# Patient Record
Sex: Female | Born: 1955 | Race: Black or African American | Hispanic: No | Marital: Married | Smoking: Former smoker
Health system: Southern US, Community
[De-identification: ages and names within clinical notes are randomized; demographics above are authoritative.]

## PROBLEM LIST (undated history)

## (undated) DIAGNOSIS — Z9884 Bariatric surgery status: Secondary | ICD-10-CM

## (undated) DIAGNOSIS — M1710 Unilateral primary osteoarthritis, unspecified knee: Secondary | ICD-10-CM

## (undated) DIAGNOSIS — M25561 Pain in right knee: Secondary | ICD-10-CM

## (undated) DIAGNOSIS — E669 Obesity, unspecified: Secondary | ICD-10-CM

## (undated) DIAGNOSIS — R9431 Abnormal electrocardiogram [ECG] [EKG]: Secondary | ICD-10-CM

## (undated) DIAGNOSIS — M1990 Unspecified osteoarthritis, unspecified site: Secondary | ICD-10-CM

## (undated) DIAGNOSIS — K436 Other and unspecified ventral hernia with obstruction, without gangrene: Secondary | ICD-10-CM

## (undated) DIAGNOSIS — M25552 Pain in left hip: Secondary | ICD-10-CM

## (undated) DIAGNOSIS — R600 Localized edema: Secondary | ICD-10-CM

## (undated) DIAGNOSIS — E538 Deficiency of other specified B group vitamins: Secondary | ICD-10-CM

## (undated) DIAGNOSIS — E785 Hyperlipidemia, unspecified: Secondary | ICD-10-CM

## (undated) DIAGNOSIS — R7303 Prediabetes: Secondary | ICD-10-CM

## (undated) DIAGNOSIS — G4700 Insomnia, unspecified: Secondary | ICD-10-CM

## (undated) DIAGNOSIS — D649 Anemia, unspecified: Secondary | ICD-10-CM

## (undated) DIAGNOSIS — R0600 Dyspnea, unspecified: Secondary | ICD-10-CM

## (undated) DIAGNOSIS — R079 Chest pain, unspecified: Secondary | ICD-10-CM

## (undated) DIAGNOSIS — R002 Palpitations: Secondary | ICD-10-CM

## (undated) DIAGNOSIS — M179 Osteoarthritis of knee, unspecified: Secondary | ICD-10-CM

## (undated) DIAGNOSIS — Z87898 Personal history of other specified conditions: Secondary | ICD-10-CM

## (undated) DIAGNOSIS — M549 Dorsalgia, unspecified: Secondary | ICD-10-CM

## (undated) DIAGNOSIS — I10 Essential (primary) hypertension: Secondary | ICD-10-CM

## (undated) DIAGNOSIS — G4733 Obstructive sleep apnea (adult) (pediatric): Secondary | ICD-10-CM

## (undated) DIAGNOSIS — R7989 Other specified abnormal findings of blood chemistry: Secondary | ICD-10-CM

## (undated) DIAGNOSIS — I519 Heart disease, unspecified: Secondary | ICD-10-CM

## (undated) HISTORY — DX: Chest pain, unspecified: R07.9

## (undated) HISTORY — PX: INGUINAL HERNIA REPAIR: SUR1180

## (undated) HISTORY — DX: Deficiency of other specified B group vitamins: E53.8

## (undated) HISTORY — DX: Heart disease, unspecified: I51.9

## (undated) HISTORY — DX: Bariatric surgery status: Z98.84

## (undated) HISTORY — DX: Pain in right knee: M25.561

## (undated) HISTORY — DX: Localized edema: R60.0

## (undated) HISTORY — DX: Obstructive sleep apnea (adult) (pediatric): G47.33

## (undated) HISTORY — DX: Pain in left hip: M25.552

## (undated) HISTORY — DX: Essential (primary) hypertension: I10

## (undated) HISTORY — DX: Other and unspecified ventral hernia with obstruction, without gangrene: K43.6

## (undated) HISTORY — DX: Anemia, unspecified: D64.9

## (undated) HISTORY — PX: TUBAL LIGATION: SHX77

## (undated) HISTORY — DX: Unilateral primary osteoarthritis, unspecified knee: M17.10

## (undated) HISTORY — DX: Obesity, unspecified: E66.9

## (undated) HISTORY — DX: Unspecified osteoarthritis, unspecified site: M19.90

## (undated) HISTORY — DX: Palpitations: R00.2

## (undated) HISTORY — DX: Osteoarthritis of knee, unspecified: M17.9

## (undated) HISTORY — DX: Personal history of other specified conditions: Z87.898

## (undated) HISTORY — DX: Dyspnea, unspecified: R06.00

## (undated) HISTORY — DX: Insomnia, unspecified: G47.00

## (undated) HISTORY — PX: GASTRIC BYPASS: SHX52

## (undated) HISTORY — DX: Abnormal electrocardiogram (ECG) (EKG): R94.31

## (undated) HISTORY — DX: Hyperlipidemia, unspecified: E78.5

## (undated) HISTORY — DX: Other specified abnormal findings of blood chemistry: R79.89

## (undated) HISTORY — DX: Dorsalgia, unspecified: M54.9

---

## 1998-05-13 ENCOUNTER — Emergency Department (HOSPITAL_COMMUNITY): Admission: EM | Admit: 1998-05-13 | Discharge: 1998-05-13 | Payer: Self-pay | Admitting: Emergency Medicine

## 1998-05-13 ENCOUNTER — Encounter: Payer: Self-pay | Admitting: Emergency Medicine

## 1999-04-19 ENCOUNTER — Emergency Department (HOSPITAL_COMMUNITY): Admission: EM | Admit: 1999-04-19 | Discharge: 1999-04-19 | Payer: Self-pay | Admitting: Emergency Medicine

## 2000-11-02 ENCOUNTER — Ambulatory Visit (HOSPITAL_COMMUNITY): Admission: RE | Admit: 2000-11-02 | Discharge: 2000-11-02 | Payer: Self-pay

## 2002-03-26 ENCOUNTER — Emergency Department (HOSPITAL_COMMUNITY): Admission: EM | Admit: 2002-03-26 | Discharge: 2002-03-26 | Payer: Self-pay | Admitting: Emergency Medicine

## 2002-03-26 ENCOUNTER — Encounter: Payer: Self-pay | Admitting: Emergency Medicine

## 2002-05-20 ENCOUNTER — Ambulatory Visit (HOSPITAL_COMMUNITY): Admission: RE | Admit: 2002-05-20 | Discharge: 2002-05-20 | Payer: Self-pay

## 2002-07-11 HISTORY — PX: FOOT SURGERY: SHX648

## 2002-11-13 ENCOUNTER — Encounter: Admission: RE | Admit: 2002-11-13 | Discharge: 2002-11-13 | Payer: Self-pay | Admitting: Internal Medicine

## 2002-11-15 ENCOUNTER — Encounter: Admission: RE | Admit: 2002-11-15 | Discharge: 2002-11-15 | Payer: Self-pay | Admitting: Internal Medicine

## 2002-12-03 ENCOUNTER — Encounter: Admission: RE | Admit: 2002-12-03 | Discharge: 2002-12-03 | Payer: Self-pay | Admitting: Internal Medicine

## 2003-06-11 DIAGNOSIS — R9431 Abnormal electrocardiogram [ECG] [EKG]: Secondary | ICD-10-CM

## 2003-06-11 HISTORY — DX: Abnormal electrocardiogram (ECG) (EKG): R94.31

## 2003-06-24 ENCOUNTER — Ambulatory Visit (HOSPITAL_COMMUNITY): Admission: RE | Admit: 2003-06-24 | Discharge: 2003-06-24 | Payer: Self-pay | Admitting: Internal Medicine

## 2003-07-02 ENCOUNTER — Ambulatory Visit (HOSPITAL_COMMUNITY): Admission: RE | Admit: 2003-07-02 | Discharge: 2003-07-02 | Payer: Self-pay

## 2003-07-17 ENCOUNTER — Encounter: Admission: RE | Admit: 2003-07-17 | Discharge: 2003-07-17 | Payer: Self-pay | Admitting: Internal Medicine

## 2003-07-23 ENCOUNTER — Encounter: Payer: Self-pay | Admitting: Cardiology

## 2003-07-23 ENCOUNTER — Ambulatory Visit (HOSPITAL_COMMUNITY): Admission: RE | Admit: 2003-07-23 | Discharge: 2003-07-23 | Payer: Self-pay | Admitting: Internal Medicine

## 2003-08-05 ENCOUNTER — Encounter: Admission: RE | Admit: 2003-08-05 | Discharge: 2003-08-05 | Payer: Self-pay | Admitting: Internal Medicine

## 2003-08-11 ENCOUNTER — Emergency Department (HOSPITAL_COMMUNITY): Admission: EM | Admit: 2003-08-11 | Discharge: 2003-08-11 | Payer: Self-pay | Admitting: Family Medicine

## 2003-08-12 ENCOUNTER — Encounter: Admission: RE | Admit: 2003-08-12 | Discharge: 2003-08-12 | Payer: Self-pay | Admitting: Internal Medicine

## 2003-09-27 ENCOUNTER — Emergency Department (HOSPITAL_COMMUNITY): Admission: AD | Admit: 2003-09-27 | Discharge: 2003-09-27 | Payer: Self-pay | Admitting: Family Medicine

## 2004-10-15 ENCOUNTER — Emergency Department (HOSPITAL_COMMUNITY): Admission: EM | Admit: 2004-10-15 | Discharge: 2004-10-15 | Payer: Self-pay | Admitting: Emergency Medicine

## 2004-11-05 ENCOUNTER — Ambulatory Visit: Payer: Self-pay | Admitting: Internal Medicine

## 2006-03-02 ENCOUNTER — Ambulatory Visit: Payer: Self-pay | Admitting: Hospitalist

## 2006-03-02 LAB — CONVERTED CEMR LAB: Pap Smear: NORMAL

## 2006-03-22 ENCOUNTER — Emergency Department (HOSPITAL_COMMUNITY): Admission: EM | Admit: 2006-03-22 | Discharge: 2006-03-22 | Payer: Self-pay | Admitting: Family Medicine

## 2006-07-18 ENCOUNTER — Ambulatory Visit: Payer: Self-pay | Admitting: Internal Medicine

## 2006-07-19 ENCOUNTER — Encounter (INDEPENDENT_AMBULATORY_CARE_PROVIDER_SITE_OTHER): Payer: Self-pay | Admitting: Internal Medicine

## 2006-07-19 ENCOUNTER — Ambulatory Visit: Payer: Self-pay | Admitting: Hospitalist

## 2006-07-19 LAB — CONVERTED CEMR LAB
Basophils Absolute: 0 10*3/uL (ref 0.0–0.1)
Basophils Relative: 1 % (ref 0–1)
Eosinophils Relative: 6 % — ABNORMAL HIGH (ref 0–5)
HCT: 36.3 % (ref 36.0–46.0)
Hemoglobin: 11.7 g/dL — ABNORMAL LOW (ref 12.0–15.0)
Lymphocytes Relative: 43 % (ref 12–46)
Lymphs Abs: 2.8 10*3/uL (ref 0.7–3.3)
MCHC: 32.2 g/dL (ref 30.0–36.0)
MCV: 88.8 fL (ref 78.0–100.0)
Monocytes Absolute: 0.5 10*3/uL (ref 0.2–0.7)
Monocytes Relative: 8 % (ref 3–11)
Neutro Abs: 2.9 10*3/uL (ref 1.7–7.7)
Neutrophils Relative %: 43 % (ref 43–77)
Platelets: 369 10*3/uL (ref 150–400)
RBC: 4.09 M/uL (ref 3.87–5.11)
RDW: 14.9 % — ABNORMAL HIGH (ref 11.5–14.0)
WBC: 6.6 10*3/uL (ref 4.0–10.5)

## 2006-08-11 ENCOUNTER — Encounter (INDEPENDENT_AMBULATORY_CARE_PROVIDER_SITE_OTHER): Payer: Self-pay | Admitting: Hospitalist

## 2006-08-11 DIAGNOSIS — J309 Allergic rhinitis, unspecified: Secondary | ICD-10-CM | POA: Insufficient documentation

## 2006-08-11 DIAGNOSIS — R600 Localized edema: Secondary | ICD-10-CM | POA: Insufficient documentation

## 2006-08-11 DIAGNOSIS — I10 Essential (primary) hypertension: Secondary | ICD-10-CM | POA: Insufficient documentation

## 2006-08-15 DIAGNOSIS — D649 Anemia, unspecified: Secondary | ICD-10-CM | POA: Insufficient documentation

## 2006-10-22 ENCOUNTER — Emergency Department (HOSPITAL_COMMUNITY): Admission: EM | Admit: 2006-10-22 | Discharge: 2006-10-22 | Payer: Self-pay | Admitting: Emergency Medicine

## 2007-03-17 ENCOUNTER — Emergency Department (HOSPITAL_COMMUNITY): Admission: EM | Admit: 2007-03-17 | Discharge: 2007-03-17 | Payer: Self-pay | Admitting: Emergency Medicine

## 2007-03-19 ENCOUNTER — Telehealth: Payer: Self-pay | Admitting: *Deleted

## 2007-07-12 HISTORY — PX: COLONOSCOPY: SHX174

## 2007-08-16 ENCOUNTER — Ambulatory Visit: Payer: Self-pay | Admitting: Internal Medicine

## 2007-08-16 ENCOUNTER — Encounter (INDEPENDENT_AMBULATORY_CARE_PROVIDER_SITE_OTHER): Payer: Self-pay | Admitting: *Deleted

## 2007-08-16 LAB — CONVERTED CEMR LAB
ALT: 8 units/L (ref 0–35)
AST: 13 units/L (ref 0–37)
Albumin: 4.3 g/dL (ref 3.5–5.2)
Alkaline Phosphatase: 95 units/L (ref 39–117)
BUN: 14 mg/dL (ref 6–23)
Basophils Absolute: 0.1 10*3/uL (ref 0.0–0.1)
Basophils Relative: 1 % (ref 0–1)
Bilirubin Urine: NEGATIVE
CO2: 26 meq/L (ref 19–32)
Calcium: 9.5 mg/dL (ref 8.4–10.5)
Chloride: 108 meq/L (ref 96–112)
Cholesterol: 187 mg/dL (ref 0–200)
Creatinine, Ser: 0.85 mg/dL (ref 0.40–1.20)
Creatinine, Urine: 205.7 mg/dL
Eosinophils Absolute: 0.3 10*3/uL (ref 0.0–0.7)
Eosinophils Relative: 5 % (ref 0–5)
Ferritin: 14 ng/mL (ref 10–291)
Glucose, Bld: 87 mg/dL (ref 70–99)
HCT: 37.7 % (ref 36.0–46.0)
HDL: 48 mg/dL (ref >39)
Hemoglobin, Urine: NEGATIVE
Hemoglobin: 11.8 g/dL — ABNORMAL LOW (ref 12.0–15.0)
Ketones, ur: NEGATIVE mg/dL
LDL Cholesterol: 120 mg/dL — ABNORMAL HIGH (ref 0–99)
Leukocytes, UA: NEGATIVE
Lymphocytes Relative: 34 % (ref 12–46)
Lymphs Abs: 2 10*3/uL (ref 0.7–4.0)
MCHC: 31.3 g/dL (ref 30.0–36.0)
MCV: 90.8 fL (ref 78.0–100.0)
Microalb Creat Ratio: 3.7 mg/g (ref 0.0–30.0)
Microalb, Ur: 0.76 mg/dL (ref 0.00–1.89)
Monocytes Absolute: 0.4 10*3/uL (ref 0.1–1.0)
Monocytes Relative: 6 % (ref 3–12)
Neutro Abs: 3.3 10*3/uL (ref 1.7–7.7)
Neutrophils Relative %: 55 % (ref 43–77)
Nitrite: NEGATIVE
Platelets: 349 10*3/uL (ref 150–400)
Potassium: 4.4 meq/L (ref 3.5–5.3)
Protein, ur: NEGATIVE mg/dL
RBC: 4.15 M/uL (ref 3.87–5.11)
RDW: 14.5 % (ref 11.5–15.5)
Sodium: 144 meq/L (ref 135–145)
Specific Gravity, Urine: 1.028 (ref 1.005–1.03)
TSH: 1.243 microintl units/mL (ref 0.350–5.50)
Total Bilirubin: 0.4 mg/dL (ref 0.3–1.2)
Total CHOL/HDL Ratio: 3.9
Total Protein: 7.4 g/dL (ref 6.0–8.3)
Triglycerides: 95 mg/dL (ref <150)
Urine Glucose: NEGATIVE mg/dL
Urobilinogen, UA: 0.2 (ref 0.0–1.0)
VLDL: 19 mg/dL (ref 0–40)
WBC: 6 10*3/uL (ref 4.0–10.5)
pH: 6 (ref 5.0–8.0)

## 2007-09-05 ENCOUNTER — Ambulatory Visit (HOSPITAL_COMMUNITY): Admission: RE | Admit: 2007-09-05 | Discharge: 2007-09-05 | Payer: Self-pay | Admitting: Hospitalist

## 2007-09-05 LAB — HM MAMMOGRAPHY

## 2007-09-13 ENCOUNTER — Ambulatory Visit: Payer: Self-pay | Admitting: Internal Medicine

## 2007-09-24 ENCOUNTER — Ambulatory Visit: Payer: Self-pay | Admitting: Hospitalist

## 2007-09-24 ENCOUNTER — Encounter (INDEPENDENT_AMBULATORY_CARE_PROVIDER_SITE_OTHER): Payer: Self-pay | Admitting: Hospitalist

## 2007-09-24 LAB — CONVERTED CEMR LAB
Cholesterol, target level: 200 mg/dL
HDL goal, serum: 40 mg/dL
LDL Goal: 160 mg/dL
Pap Smear: NEGATIVE

## 2007-09-26 ENCOUNTER — Ambulatory Visit: Payer: Self-pay | Admitting: Internal Medicine

## 2007-09-26 ENCOUNTER — Encounter (INDEPENDENT_AMBULATORY_CARE_PROVIDER_SITE_OTHER): Payer: Self-pay | Admitting: Internal Medicine

## 2007-09-26 LAB — HM COLONOSCOPY

## 2007-10-30 ENCOUNTER — Ambulatory Visit: Payer: Self-pay | Admitting: Hospitalist

## 2007-10-31 ENCOUNTER — Ambulatory Visit: Payer: Self-pay | Admitting: Vascular Surgery

## 2007-10-31 ENCOUNTER — Encounter (INDEPENDENT_AMBULATORY_CARE_PROVIDER_SITE_OTHER): Payer: Self-pay | Admitting: Internal Medicine

## 2007-10-31 ENCOUNTER — Encounter (INDEPENDENT_AMBULATORY_CARE_PROVIDER_SITE_OTHER): Payer: Self-pay | Admitting: Hospitalist

## 2007-10-31 ENCOUNTER — Ambulatory Visit (HOSPITAL_COMMUNITY): Admission: RE | Admit: 2007-10-31 | Discharge: 2007-10-31 | Payer: Self-pay | Admitting: Hospitalist

## 2008-01-04 ENCOUNTER — Inpatient Hospital Stay (HOSPITAL_COMMUNITY): Admission: EM | Admit: 2008-01-04 | Discharge: 2008-01-05 | Payer: Self-pay | Admitting: Emergency Medicine

## 2008-01-16 ENCOUNTER — Encounter (INDEPENDENT_AMBULATORY_CARE_PROVIDER_SITE_OTHER): Payer: Self-pay | Admitting: *Deleted

## 2008-01-16 ENCOUNTER — Ambulatory Visit: Payer: Self-pay | Admitting: Infectious Diseases

## 2008-01-16 LAB — CONVERTED CEMR LAB
BUN: 16 mg/dL (ref 6–23)
CO2: 28 meq/L (ref 19–32)
Calcium: 9.8 mg/dL (ref 8.4–10.5)
Chloride: 99 meq/L (ref 96–112)
Creatinine, Ser: 0.89 mg/dL (ref 0.40–1.20)
Glucose, Bld: 106 mg/dL — ABNORMAL HIGH (ref 70–99)
Potassium: 3.5 meq/L (ref 3.5–5.3)
Sodium: 140 meq/L (ref 135–145)

## 2008-05-19 ENCOUNTER — Emergency Department (HOSPITAL_COMMUNITY): Admission: EM | Admit: 2008-05-19 | Discharge: 2008-05-19 | Payer: Self-pay | Admitting: Family Medicine

## 2008-05-26 ENCOUNTER — Ambulatory Visit: Payer: Self-pay | Admitting: *Deleted

## 2008-06-02 ENCOUNTER — Ambulatory Visit: Payer: Self-pay | Admitting: Family Medicine

## 2008-06-02 DIAGNOSIS — M159 Polyosteoarthritis, unspecified: Secondary | ICD-10-CM | POA: Insufficient documentation

## 2008-06-03 ENCOUNTER — Encounter: Payer: Self-pay | Admitting: Family Medicine

## 2008-06-09 ENCOUNTER — Encounter: Admission: RE | Admit: 2008-06-09 | Discharge: 2008-06-09 | Payer: Self-pay | Admitting: Family Medicine

## 2008-07-11 HISTORY — PX: INCISION AND DRAINAGE ABSCESS ANAL: SUR669

## 2008-07-14 ENCOUNTER — Ambulatory Visit: Payer: Self-pay | Admitting: Family Medicine

## 2008-07-29 ENCOUNTER — Emergency Department (HOSPITAL_COMMUNITY): Admission: EM | Admit: 2008-07-29 | Discharge: 2008-07-29 | Payer: Self-pay | Admitting: Emergency Medicine

## 2008-07-31 ENCOUNTER — Telehealth: Payer: Self-pay | Admitting: Internal Medicine

## 2008-07-31 ENCOUNTER — Encounter: Payer: Self-pay | Admitting: Internal Medicine

## 2008-07-31 ENCOUNTER — Ambulatory Visit: Payer: Self-pay | Admitting: Internal Medicine

## 2008-07-31 ENCOUNTER — Ambulatory Visit (HOSPITAL_COMMUNITY): Admission: AD | Admit: 2008-07-31 | Discharge: 2008-07-31 | Payer: Self-pay | Admitting: *Deleted

## 2008-07-31 ENCOUNTER — Encounter (INDEPENDENT_AMBULATORY_CARE_PROVIDER_SITE_OTHER): Payer: Self-pay | Admitting: Internal Medicine

## 2008-09-17 ENCOUNTER — Ambulatory Visit: Payer: Self-pay | Admitting: Family Medicine

## 2008-12-04 ENCOUNTER — Telehealth (INDEPENDENT_AMBULATORY_CARE_PROVIDER_SITE_OTHER): Payer: Self-pay | Admitting: Internal Medicine

## 2009-01-01 ENCOUNTER — Telehealth (INDEPENDENT_AMBULATORY_CARE_PROVIDER_SITE_OTHER): Payer: Self-pay | Admitting: Internal Medicine

## 2009-01-20 ENCOUNTER — Encounter (INDEPENDENT_AMBULATORY_CARE_PROVIDER_SITE_OTHER): Payer: Self-pay | Admitting: Internal Medicine

## 2009-01-20 ENCOUNTER — Ambulatory Visit: Payer: Self-pay | Admitting: Internal Medicine

## 2009-01-20 ENCOUNTER — Ambulatory Visit (HOSPITAL_COMMUNITY): Admission: RE | Admit: 2009-01-20 | Discharge: 2009-01-20 | Payer: Self-pay | Admitting: Infectious Diseases

## 2009-01-20 DIAGNOSIS — M545 Low back pain, unspecified: Secondary | ICD-10-CM | POA: Insufficient documentation

## 2009-01-20 LAB — CONVERTED CEMR LAB
BUN: 12 mg/dL (ref 6–23)
Basophils Absolute: 0 10*3/uL (ref 0.0–0.1)
Basophils Relative: 1 % (ref 0–1)
Bilirubin Urine: NEGATIVE
Blood in Urine, dipstick: NEGATIVE
CO2: 23 meq/L (ref 19–32)
Calcium: 9.7 mg/dL (ref 8.4–10.5)
Chloride: 105 meq/L (ref 96–112)
Cholesterol: 201 mg/dL — ABNORMAL HIGH (ref 0–200)
Creatinine, Ser: 0.74 mg/dL (ref 0.40–1.20)
Eosinophils Absolute: 0.2 10*3/uL (ref 0.0–0.7)
Eosinophils Relative: 3 % (ref 0–5)
Glucose, Bld: 84 mg/dL (ref 70–99)
Glucose, Urine, Semiquant: NEGATIVE
HCT: 35.9 % — ABNORMAL LOW (ref 36.0–46.0)
HDL: 51 mg/dL (ref >39)
Hemoglobin: 11.6 g/dL — ABNORMAL LOW (ref 12.0–15.0)
Ketones, urine, test strip: NEGATIVE
LDL Cholesterol: 133 mg/dL — ABNORMAL HIGH (ref 0–99)
Lymphocytes Relative: 42 % (ref 12–46)
Lymphs Abs: 2.1 10*3/uL (ref 0.7–4.0)
MCHC: 32.3 g/dL (ref 30.0–36.0)
MCV: 87.1 fL (ref 78.0–100.0)
Monocytes Absolute: 0.4 10*3/uL (ref 0.1–1.0)
Monocytes Relative: 9 % (ref 3–12)
Neutro Abs: 2.3 10*3/uL (ref 1.7–7.7)
Neutrophils Relative %: 46 % (ref 43–77)
Nitrite: NEGATIVE
Platelets: 307 10*3/uL (ref 150–400)
Potassium: 3.8 meq/L (ref 3.5–5.3)
Protein, U semiquant: NEGATIVE
RBC: 4.12 M/uL (ref 3.87–5.11)
RDW: 14.4 % (ref 11.5–15.5)
Sed Rate: 27 mm/hr — ABNORMAL HIGH (ref 0–22)
Sodium: 139 meq/L (ref 135–145)
Specific Gravity, Urine: >=1.03
Total CHOL/HDL Ratio: 3.9
Triglycerides: 83 mg/dL (ref <150)
Urobilinogen, UA: 0.2
VLDL: 17 mg/dL (ref 0–40)
WBC: 5 10*3/uL (ref 4.0–10.5)
pH: 5

## 2009-03-21 ENCOUNTER — Emergency Department (HOSPITAL_COMMUNITY): Admission: EM | Admit: 2009-03-21 | Discharge: 2009-03-21 | Payer: Self-pay | Admitting: Emergency Medicine

## 2009-09-06 ENCOUNTER — Emergency Department (HOSPITAL_COMMUNITY): Admission: EM | Admit: 2009-09-06 | Discharge: 2009-09-06 | Payer: Self-pay | Admitting: Emergency Medicine

## 2009-10-28 ENCOUNTER — Emergency Department (HOSPITAL_COMMUNITY): Admission: EM | Admit: 2009-10-28 | Discharge: 2009-10-28 | Payer: Self-pay | Admitting: Family Medicine

## 2010-04-10 DIAGNOSIS — K436 Other and unspecified ventral hernia with obstruction, without gangrene: Secondary | ICD-10-CM

## 2010-04-10 HISTORY — DX: Other and unspecified ventral hernia with obstruction, without gangrene: K43.6

## 2010-05-03 ENCOUNTER — Emergency Department (HOSPITAL_COMMUNITY): Admission: EM | Admit: 2010-05-03 | Discharge: 2010-05-03 | Payer: Self-pay | Admitting: Emergency Medicine

## 2010-05-10 ENCOUNTER — Ambulatory Visit: Payer: Self-pay | Admitting: Internal Medicine

## 2010-05-10 DIAGNOSIS — J984 Other disorders of lung: Secondary | ICD-10-CM | POA: Insufficient documentation

## 2010-05-18 ENCOUNTER — Emergency Department (HOSPITAL_COMMUNITY): Admission: EM | Admit: 2010-05-18 | Discharge: 2010-05-18 | Payer: Self-pay | Admitting: Emergency Medicine

## 2010-06-17 ENCOUNTER — Ambulatory Visit: Payer: Self-pay

## 2010-07-14 ENCOUNTER — Emergency Department (HOSPITAL_COMMUNITY)
Admission: EM | Admit: 2010-07-14 | Discharge: 2010-07-15 | Payer: Self-pay | Source: Home / Self Care | Admitting: Emergency Medicine

## 2010-07-14 LAB — BASIC METABOLIC PANEL
BUN: 11 mg/dL (ref 6–23)
CO2: 27 mEq/L (ref 19–32)
Calcium: 9.6 mg/dL (ref 8.4–10.5)
Chloride: 107 mEq/L (ref 96–112)
Creatinine, Ser: 0.88 mg/dL (ref 0.4–1.2)
GFR calc Af Amer: >60 mL/min (ref >60)
GFR calc non Af Amer: >60 mL/min (ref >60)
Glucose, Bld: 96 mg/dL (ref 70–99)
Potassium: 3.9 mEq/L (ref 3.5–5.1)
Sodium: 141 mEq/L (ref 135–145)

## 2010-07-14 LAB — DIFFERENTIAL
Basophils Absolute: 0 10*3/uL (ref 0.0–0.1)
Basophils Relative: 1 % (ref 0–1)
Eosinophils Absolute: 0.4 10*3/uL (ref 0.0–0.7)
Eosinophils Relative: 6 % — ABNORMAL HIGH (ref 0–5)
Lymphocytes Relative: 45 % (ref 12–46)
Lymphs Abs: 2.5 10*3/uL (ref 0.7–4.0)
Monocytes Absolute: 0.4 10*3/uL (ref 0.1–1.0)
Monocytes Relative: 6 % (ref 3–12)
Neutro Abs: 2.4 10*3/uL (ref 1.7–7.7)
Neutrophils Relative %: 42 % — ABNORMAL LOW (ref 43–77)

## 2010-07-14 LAB — CBC
HCT: 37.6 % (ref 36.0–46.0)
Hemoglobin: 11.8 g/dL — ABNORMAL LOW (ref 12.0–15.0)
MCH: 28 pg (ref 26.0–34.0)
MCHC: 31.4 g/dL (ref 30.0–36.0)
MCV: 89.3 fL (ref 78.0–100.0)
Platelets: 332 10*3/uL (ref 150–400)
RBC: 4.21 MIL/uL (ref 3.87–5.11)
RDW: 14.3 % (ref 11.5–15.5)
WBC: 5.7 10*3/uL (ref 4.0–10.5)

## 2010-07-14 LAB — LACTIC ACID, PLASMA: Lactic Acid, Venous: 1.2 mmol/L (ref 0.5–2.2)

## 2010-07-15 ENCOUNTER — Emergency Department (HOSPITAL_COMMUNITY)
Admission: EM | Admit: 2010-07-15 | Discharge: 2010-07-16 | Payer: Self-pay | Source: Home / Self Care | Admitting: Emergency Medicine

## 2010-07-15 ENCOUNTER — Telehealth: Payer: Self-pay | Admitting: *Deleted

## 2010-07-15 LAB — CBC
HCT: 35.1 % — ABNORMAL LOW (ref 36.0–46.0)
Hemoglobin: 11.1 g/dL — ABNORMAL LOW (ref 12.0–15.0)
MCH: 28.2 pg (ref 26.0–34.0)
MCHC: 31.6 g/dL (ref 30.0–36.0)
MCV: 89.3 fL (ref 78.0–100.0)
Platelets: 337 10*3/uL (ref 150–400)
RBC: 3.93 MIL/uL (ref 3.87–5.11)
RDW: 14.1 % (ref 11.5–15.5)
WBC: 6.1 10*3/uL (ref 4.0–10.5)

## 2010-07-15 LAB — DIFFERENTIAL
Basophils Absolute: 0 10*3/uL (ref 0.0–0.1)
Basophils Relative: 1 % (ref 0–1)
Eosinophils Absolute: 0.4 10*3/uL (ref 0.0–0.7)
Eosinophils Relative: 6 % — ABNORMAL HIGH (ref 0–5)
Lymphocytes Relative: 34 % (ref 12–46)
Lymphs Abs: 2 10*3/uL (ref 0.7–4.0)
Monocytes Absolute: 0.4 10*3/uL (ref 0.1–1.0)
Monocytes Relative: 6 % (ref 3–12)
Neutro Abs: 3.3 10*3/uL (ref 1.7–7.7)
Neutrophils Relative %: 54 % (ref 43–77)

## 2010-07-16 LAB — RAPID STREP SCREEN (MED CTR MEBANE ONLY): Streptococcus, Group A Screen (Direct): NEGATIVE

## 2010-08-12 NOTE — Assessment & Plan Note (Signed)
Summary: ACUTE-ER/FU/MEDS/(CFB)[name]/   Vital Signs:  Patient profile:   55 year old female Height:      65 inches Weight:      3265 pounds BMI:     545.29 Temp:     98.1 degrees F oral Pulse rate:   84 / minute BP sitting:   174 / 91  (right arm)  Vitals Entered By: Filomena Jungling NT II (May 10, 2010 10:53 AM) CC: ERFOLLOW-UP Is Patient Diabetic? No Pain Assessment Patient in pain? yes     Location: abdomen Intensity: 7 Onset of pain  1 weeki  Have you ever been in a relationship where you felt threatened, hurt or afraid?No   Does patient need assistance? Functional Status Self care Ambulation Normal   Primary Care Provider:  Johnette Abraham DO  CC:  ERFOLLOW-UP.  History of Present Illness: 55 y/o w with h/o asthma, HTN, DJD chornic knee pain, comes to the clinic for ER follow up she was in the ER in 10/28 for abdominal pain that was  from mildly incarcerated umbilical hernia   umbilical hernia- pain controlled with hydrocodone-apap, she is nauseas as well. no fever, chills, sever pain. she has not contacted general surgery yet.     Preventive Screening-Counseling & Management  Alcohol-Tobacco     Alcohol drinks/day: 0     Smoking Status: quit     Year Quit: 1999  Caffeine-Diet-Exercise     Does Patient Exercise: no  Current Medications (verified): 1)  Loratadine 10 Mg Tabs (Loratadine) .... Once Daily 2)  Lisinopril 20 Mg  Tabs (Lisinopril) .... Take 1 Tablet By Mouth Once A Day 3)  Hydrochlorothiazide 25 Mg  Tabs (Hydrochlorothiazide) .... Take 1 Tablet By Mouth Once A Day 4)  Hydrocodone-Acetaminophen 5-325 Mg Tabs (Hydrocodone-Acetaminophen) .... Take 1-2 Tablets By Mouth Every 4-6 Hours As Needed For Pain 5)  Diclofenac Sodium 75 Mg Tbec (Diclofenac Sodium) .[name].. 1 By Mouth Two Times A Day 6)  Nabumetone 500 Mg Tabs (Nabumetone) .... Take One Tablet Two Times A Day 7)  Flexeril 10 Mg Tabs (Cyclobenzaprine Hcl) .... Take One Tablet Twice A Day  As Needed For Pain.  Allergies (verified): No Known Drug Allergies  Review of Systems  The patient denies anorexia, fever, weight loss, weight gain, vision loss, decreased hearing, hoarseness, chest pain, syncope, dyspnea on exertion, peripheral edema, prolonged cough, headaches, hemoptysis, abdominal pain, melena, hematochezia, severe indigestion/heartburn, hematuria, incontinence, genital sores, muscle weakness, suspicious skin lesions, transient blindness, difficulty walking, depression, unusual weight change, abnormal bleeding, enlarged lymph nodes, angioedema, breast masses, and testicular masses.    Physical Exam  General:  Gen: VS reveiwed, Alert, well developed, nodistress ENT: mucous membranes pink & moist. No abnormal finds in ear and nose. CVC:S1 S2 , no murmurs, no abnormal heart sounds. Lungs: Clear to auscultation B/L. No wheezes, crackles or other abnormal sounds Abdomen: soft, non distended, no tender. Normal Bowel sounds EXT: no pitting edema, no engorged veins, Pulsations normal  Neuro:alert, oriented *3, cranial nerved 2-12 intact, strenght normal in all  extremities, senstations normal to light touch.      Impression & Recommendations:  Problem # 1:  UMBILICAL HERNIA WITH OBSTRUCTION (ICD-552.1)  mildly incarcerated hernia seen on the CT abd on 10/28 close surgery follow up reccomeded from ed, patient has not called the Dr. Ezzard Standing yet  plan - schedule [facility] appoinment for hernian. repair, she is clear for surgery - phergan for nausea - conitnue hydrocodone -apap for pain prescribed by ed -  d/W Jaynee Eagles for financial assistance, as she does not have any insurance and needs to go to the [facility].   Orders: Surgical Referral (Surgery)  Problem # 2:  LUNG NODULE (ICD-518.89) several small left lower lobe nodule seen as incidental finding on CT scan in 10/11. Follow up CT with iv contrast reccomeded in 6 months from 10/11.   Complete Medication List: 1)   Loratadine 10 Mg Tabs (Loratadine) .... Once daily 2)  Lisinopril 20 Mg Tabs (Lisinopril) .... Take 1 tablet by mouth once a day 3)  Hydrochlorothiazide 25 Mg Tabs (Hydrochlorothiazide) .... Take 1 tablet by mouth once a day 4)  Hydrocodone-acetaminophen 5-325 Mg Tabs (Hydrocodone-acetaminophen) .... Take 1-2 tablets by mouth every 4-6 hours as needed for pain 5)  Diclofenac Sodium 75 Mg Tbec (Diclofenac sodium) .[name].. 1 by mouth two times a day 6)  Nabumetone 500 Mg Tabs (Nabumetone) .... Take one tablet two times a day 7)  Flexeril 10 Mg Tabs (Cyclobenzaprine hcl) .... Take one tablet twice a day as needed for pain. 8)  Promethazine Hcl 25 Mg/ml Soln (Promethazine hcl) .... Every 6 hrs as needed for nausea/vomiting. may make you drowsy.  Patient Instructions: 1)  Please schedule a follow-up appointment in 2 weeks. Prescriptions: PROMETHAZINE HCL 25 MG/ML SOLN (PROMETHAZINE HCL) every 6 hrs as needed for nausea/vomiting. may make you drowsy.  #30 x 0   Entered and Authorized by:   Bethel Born MD   Signed by:   Bethel Born MD on 05/10/2010   Method used:   Electronically to        [facility] [phone]* (retail)       [address]       [place], [place]  [address]       Ph: [phone]       Fax: [phone]   RxID:   [id number]    Orders Added: 1)  Surgical Referral [Surgery] 2)  Est. Patient Level IV [06301]    Prevention & Chronic Care Immunizations   Influenza vaccine: Not documented    Tetanus booster: Not documented    Pneumococcal vaccine: Not documented  Colorectal Screening   Hemoccult: Not documented    Colonoscopy: 1) SMALL EXTERNAL HEMORRHOIDS 2) OTHERWISE NORMAL WITH EXCELLENT PREP  (09/26/2007)   Colonoscopy action/deferral: Repeat colonoscopy in 10 years.    (09/26/2007)   Colonoscopy due: 10/2017  Other Screening   Pap smear: Interpretation/Result:Negative for intraepithelial Lesion or Malignancy.   Location: [facility].     (09/24/2007)   Pap smear due: 09/24/2010    Mammogram: No specific mammographic evidence of malignancy.  Assessment: BIRADS 1. No significant changes compared to previous study.  Location: [facility].     (09/05/2007)   Mammogram action/deferral: Screening mammogram in 1 year.     (09/05/2007)   Mammogram due: 09/04/2009   Smoking status: quit  (05/10/2010)  Lipids   Total Cholesterol: 201  (01/20/2009)   Lipid panel action/deferral: Lipid Panel ordered   LDL: 133  (01/20/2009)   LDL Direct: Not documented   HDL: 51  (01/20/2009)   Triglycerides: 83  (01/20/2009)    SGOT (AST): 13  (08/16/2007)   SGPT (ALT): 8  (08/16/2007)   Alkaline phosphatase: 95  (08/16/2007)   Total bilirubin: 0.4  (08/16/2007)  Hypertension   Last Blood Pressure: 174 / 91  (05/10/2010)   Serum creatinine: 0.74  (01/20/2009)   BMP action: Ordered   Serum potassium 3.8  (01/20/2009)  Self-Management  Support :    Patient will work on the following items until the next clinic visit to reach self-care goals:     Medications and monitoring: take my medicines every day  (05/10/2010)     Eating: drink diet soda or water instead of juice or soda, eat more vegetables, use fresh or frozen vegetables, eat foods that are low in salt, eat fruit for snacks and desserts, limit or avoid alcohol  (01/20/2009)    Hypertension self-management support: Not documented    Lipid self-management support: Not documented

## 2010-08-12 NOTE — Progress Notes (Signed)
Summary: phone/gg  Phone Note Call from Patient   Caller: Patient Summary of Call: Pt called stating she was seen in ED last night for umbilical hernia.  She had CT etc, Today she is c/o swelling under tongue and in mouth.  Called pt back within 30 minutes and no answer.  Advised pt to go to ED because of the late  hour and complaint of swelling inside mouth.  Had to leave this as a message. Initial call taken by: Merrie Roof RN,  July 15, 2010 4:39 PM

## 2010-08-12 NOTE — Miscellaneous (Signed)
Summary: Medical Surgical Procedures  Medical Surgical Procedures   Imported By: Florinda Marker 08/01/2008 11:47:33  _____________________________________________________________________  External Attachment:    Type:   Image     Comment:   External Document

## 2010-09-10 ENCOUNTER — Encounter: Payer: Self-pay | Admitting: Internal Medicine

## 2010-09-10 ENCOUNTER — Ambulatory Visit (INDEPENDENT_AMBULATORY_CARE_PROVIDER_SITE_OTHER): Payer: Self-pay | Admitting: Internal Medicine

## 2010-09-10 ENCOUNTER — Other Ambulatory Visit: Payer: Self-pay | Admitting: Internal Medicine

## 2010-09-10 VITALS — BP 143/81 | HR 77 | Temp 98.0°F | Ht 64.0 in | Wt 331.1 lb

## 2010-09-10 DIAGNOSIS — M21069 Valgus deformity, not elsewhere classified, unspecified knee: Secondary | ICD-10-CM

## 2010-09-10 DIAGNOSIS — R0683 Snoring: Secondary | ICD-10-CM | POA: Insufficient documentation

## 2010-09-10 DIAGNOSIS — R0609 Other forms of dyspnea: Secondary | ICD-10-CM

## 2010-09-10 DIAGNOSIS — J309 Allergic rhinitis, unspecified: Secondary | ICD-10-CM

## 2010-09-10 DIAGNOSIS — K420 Umbilical hernia with obstruction, without gangrene: Secondary | ICD-10-CM

## 2010-09-10 DIAGNOSIS — E785 Hyperlipidemia, unspecified: Secondary | ICD-10-CM

## 2010-09-10 DIAGNOSIS — R609 Edema, unspecified: Secondary | ICD-10-CM

## 2010-09-10 DIAGNOSIS — J45909 Unspecified asthma, uncomplicated: Secondary | ICD-10-CM

## 2010-09-10 DIAGNOSIS — I10 Essential (primary) hypertension: Secondary | ICD-10-CM

## 2010-09-10 DIAGNOSIS — Z0000 Encounter for general adult medical examination without abnormal findings: Secondary | ICD-10-CM

## 2010-09-10 DIAGNOSIS — D649 Anemia, unspecified: Secondary | ICD-10-CM

## 2010-09-10 DIAGNOSIS — J984 Other disorders of lung: Secondary | ICD-10-CM

## 2010-09-10 DIAGNOSIS — M1990 Unspecified osteoarthritis, unspecified site: Secondary | ICD-10-CM

## 2010-09-10 DIAGNOSIS — M549 Dorsalgia, unspecified: Secondary | ICD-10-CM

## 2010-09-10 LAB — COMPREHENSIVE METABOLIC PANEL
ALT: 8 U/L (ref 0–35)
AST: 15 U/L (ref 0–37)
Albumin: 4.2 g/dL (ref 3.5–5.2)
Alkaline Phosphatase: 99 U/L (ref 39–117)
BUN: 10 mg/dL (ref 6–23)
CO2: 28 mEq/L (ref 19–32)
Calcium: 9.9 mg/dL (ref 8.4–10.5)
Chloride: 108 mEq/L (ref 96–112)
Creat: 0.84 mg/dL (ref 0.40–1.20)
Glucose, Bld: 91 mg/dL (ref 70–99)
Potassium: 4.2 mEq/L (ref 3.5–5.3)
Sodium: 143 mEq/L (ref 135–145)
Total Bilirubin: 0.3 mg/dL (ref 0.3–1.2)
Total Protein: 6.8 g/dL (ref 6.0–8.3)

## 2010-09-10 LAB — CBC
HCT: 38 % (ref 36.0–46.0)
Hemoglobin: 11.8 g/dL — ABNORMAL LOW (ref 12.0–15.0)
MCH: 28.1 pg (ref 26.0–34.0)
MCHC: 31.1 g/dL (ref 30.0–36.0)
MCV: 90.5 fL (ref 78.0–100.0)
Platelets: 371 10*3/uL (ref 150–400)
RBC: 4.2 MIL/uL (ref 3.87–5.11)
RDW: 14.2 % (ref 11.5–15.5)
WBC: 5.4 10*3/uL (ref 4.0–10.5)

## 2010-09-10 MED ORDER — LISINOPRIL 20 MG PO TABS: 20.0000 mg | ORAL_TABLET | Freq: Every day | ORAL | Status: DC

## 2010-09-10 MED ORDER — HYDROCHLOROTHIAZIDE 25 MG PO TABS: 25.0000 mg | ORAL_TABLET | Freq: Every day | ORAL | Status: DC

## 2010-09-10 NOTE — Progress Notes (Signed)
  Subjective:    Patient ID: [name], female    DOB: February 22, 1956, 55 y.o.   MRN: [id number]  HPI  Patient is a 55 year old female with past medical history of hyperlipidemia, hypertension, asthma who presents to the clinic today for the following :  1)  Pap smear- the patient indicates that she is currently sexually active with one monogamous female partner. Denies any vaginal itching, vaginal lesions, or discharge. Denies dysuria, hematuria, malodorous urine. Denies change in bowel movements. Denies history of abnormal Pap smears.  2) HTN -  The patient indicates that she has been only intermittently taking blood pressure medications x3 months secondary to increased demands towards her husband's health care. Confirm occasional infrequent headaches, but denies visual changes, lightheadedness, or dizziness. Patient further denies chest pain, palpitations, tachycardia, shortness of breath, difficulty breathing.  3) Snoring - Patient indicates a long history of snoring as confirmed by her husband.  Confirms daytime sleepiness, multiple nightly awakenings feeling as if she is gasping for air. Has not previously had a sleep study.   Otherwise without history of depression, no feelings of isolation.     Review of Systems Per HPI    Objective:   Physical Exam General: Vital signs reviewed and noted. Well-developed,well-nourished,in no acute distress; alert,appropriate and cooperative throughout examination. Head: normocephalic, atraumatic. Neck: No deformities, masses, or tenderness noted. Lungs: Normal respiratory effort. Clear to auscultation BL without crackles or wheezes.  Heart: RRR. S1 and S2 normal, (+) 2/6 systolic murmur.  Abdomen: Obese. BS normoactive. Soft, Nondistended, non-tender.  No masses or organomegaly. Extremities: No pretibial edema. Vaginal Exam: minimal, creamy, non-malodorous discharge in vaginal vault. No appreciable lesions, normal-appearing cervix. No cervical motion  tenderness.         Assessment & Plan:

## 2010-09-10 NOTE — Patient Instructions (Signed)
1) Please follow-up at the clinic in 2-3 weeks for follow-up of your blood work and blood pressure. 2) You have been restarted on hydrochlorothiazide and lisinopril, take these medications regularly. If you develop rash, throat closing, tongue swelling, please stop the medications and call the clinic at [phone]. 3) You will be called for a sleep study soon. Will be best to complete it after establishing with [facility]. 4) Please bring all of your medications in a bag to your next visit. 5) Please work on weight reduction and healthy eating as discussed.

## 2010-09-10 NOTE — Assessment & Plan Note (Addendum)
-   PAP with wet prep completed today. - Order mammogram today, scholarship given.  - Check CMP, CBC today. - Will check records for prior Tdap --> likely administer next continuity visit. - Check FLP next visit, patient instructed to be fasting.

## 2010-09-11 ENCOUNTER — Encounter: Payer: Self-pay | Admitting: Internal Medicine

## 2010-09-11 LAB — C. TRACHOMATIS / N. GONORRHOEAE, DNA PROBE
Candida species: POSITIVE — AB
Gardnerella vaginalis: NEGATIVE
Trichomonas vaginosis: NEGATIVE

## 2010-09-11 LAB — WET PREP BY MOLECULAR PROBE
Candida species: POSITIVE — AB
Gardnerella vaginalis: NEGATIVE
Trichomonas vaginosis: NEGATIVE

## 2010-09-12 ENCOUNTER — Encounter: Payer: Self-pay | Admitting: Internal Medicine

## 2010-09-12 NOTE — Assessment & Plan Note (Signed)
Patient's history and body habitus support OSA as likely cause of symptomatology. Has not previously had a sleep study. - Sleep study ordered.

## 2010-09-12 NOTE — Assessment & Plan Note (Addendum)
Patient only intermittently taking her hypertensive medications x3 months. Has mildly elevated blood pressure today, prior clinic visits, SBP have been consistently around 170-180 mmHg. - Will resume current regimen - Will check CMET today, and repeat BMET in approximately 4-6 weeks to reevalaute renal function. - Patient encouraged towards medication compliance. - Patient counseled about low-sodium diet, weight loss, and increased exercise (as tolerated, instructed to stop if starts having chest pain, and come to ER for evaluation).

## 2010-09-20 ENCOUNTER — Telehealth: Payer: Self-pay | Admitting: Internal Medicine

## 2010-09-20 DIAGNOSIS — B3731 Acute candidiasis of vulva and vagina: Secondary | ICD-10-CM | POA: Insufficient documentation

## 2010-09-20 DIAGNOSIS — B373 Candidiasis of vulva and vagina: Secondary | ICD-10-CM

## 2010-09-20 MED ORDER — FLUCONAZOLE 150 MG PO TABS: 150.0000 mg | ORAL_TABLET | Freq: Every day | ORAL | Status: AC

## 2010-09-20 NOTE — Telephone Encounter (Signed)
Spoke with patient regarding wet prep results, and that she will be started on Fluconazole for yeast infection. Patient expressed understanding of results provided and all questions were answered. Pt has clinic follow-up in 2 weeks, however, is asked to return sooner if symptoms not improved.

## 2010-09-22 LAB — DIFFERENTIAL
Basophils Absolute: 0 10*3/uL (ref 0.0–0.1)
Basophils Relative: 1 % (ref 0–1)
Eosinophils Absolute: 0.3 10*3/uL (ref 0.0–0.7)
Eosinophils Relative: 5 % (ref 0–5)
Lymphocytes Relative: 43 % (ref 12–46)
Lymphs Abs: 2.4 10*3/uL (ref 0.7–4.0)
Monocytes Absolute: 0.5 10*3/uL (ref 0.1–1.0)
Monocytes Relative: 9 % (ref 3–12)
Neutro Abs: 2.4 10*3/uL (ref 1.7–7.7)
Neutrophils Relative %: 42 % — ABNORMAL LOW (ref 43–77)

## 2010-09-22 LAB — BASIC METABOLIC PANEL
BUN: 14 mg/dL (ref 6–23)
CO2: 25 mEq/L (ref 19–32)
Calcium: 9.2 mg/dL (ref 8.4–10.5)
Chloride: 110 mEq/L (ref 96–112)
Creatinine, Ser: 1.05 mg/dL (ref 0.4–1.2)
GFR calc Af Amer: >60 mL/min (ref >60)
GFR calc non Af Amer: 55 mL/min — ABNORMAL LOW (ref >60)
Glucose, Bld: 99 mg/dL (ref 70–99)
Potassium: 3.6 mEq/L (ref 3.5–5.1)
Sodium: 141 mEq/L (ref 135–145)

## 2010-09-22 LAB — HEPATIC FUNCTION PANEL
ALT: 11 U/L (ref 0–35)
AST: 16 U/L (ref 0–37)
Albumin: 3.4 g/dL — ABNORMAL LOW (ref 3.5–5.2)
Alkaline Phosphatase: 98 U/L (ref 39–117)
Bilirubin, Direct: 0.1 mg/dL (ref 0.0–0.3)
Indirect Bilirubin: 0.3 mg/dL (ref 0.3–0.9)
Total Bilirubin: 0.4 mg/dL (ref 0.3–1.2)
Total Protein: 6.8 g/dL (ref 6.0–8.3)

## 2010-09-22 LAB — CBC
HCT: 36.1 % (ref 36.0–46.0)
Hemoglobin: 11.3 g/dL — ABNORMAL LOW (ref 12.0–15.0)
MCH: 27.5 pg (ref 26.0–34.0)
MCHC: 31.3 g/dL (ref 30.0–36.0)
MCV: 87.8 fL (ref 78.0–100.0)
Platelets: 352 10*3/uL (ref 150–400)
RBC: 4.11 MIL/uL (ref 3.87–5.11)
RDW: 14.5 % (ref 11.5–15.5)
WBC: 5.6 10*3/uL (ref 4.0–10.5)

## 2010-09-22 LAB — URINALYSIS, ROUTINE W REFLEX MICROSCOPIC
Bilirubin Urine: NEGATIVE
Glucose, UA: NEGATIVE mg/dL
Hgb urine dipstick: NEGATIVE
Ketones, ur: NEGATIVE mg/dL
Nitrite: NEGATIVE
Protein, ur: NEGATIVE mg/dL
Specific Gravity, Urine: 1.021 (ref 1.005–1.030)
Urobilinogen, UA: 1 mg/dL (ref 0.0–1.0)
pH: 6.5 (ref 5.0–8.0)

## 2010-09-22 LAB — LIPASE, BLOOD: Lipase: 27 U/L (ref 11–59)

## 2010-09-25 ENCOUNTER — Encounter: Payer: Self-pay | Admitting: Internal Medicine

## 2010-09-29 LAB — URINE MICROSCOPIC-ADD ON

## 2010-09-29 LAB — URINALYSIS, ROUTINE W REFLEX MICROSCOPIC
Glucose, UA: NEGATIVE mg/dL
Ketones, ur: 15 mg/dL — AB
Nitrite: NEGATIVE
Protein, ur: >300 mg/dL — AB
Specific Gravity, Urine: 1.031 — ABNORMAL HIGH (ref 1.005–1.030)
Urobilinogen, UA: 1 mg/dL (ref 0.0–1.0)
pH: 5.5 (ref 5.0–8.0)

## 2010-09-29 LAB — POCT PREGNANCY, URINE: Preg Test, Ur: NEGATIVE

## 2010-10-05 ENCOUNTER — Encounter: Payer: Self-pay | Admitting: *Deleted

## 2010-10-05 NOTE — Progress Notes (Unsigned)
Pt walked into clinic with request for refill of diflucan.  She was treated on 3/12 for candida infection. She has return appointment 4/5. She is still having itching to lower legs. Talked with Dr Aundria Rud and he ordered one more dose of diflucan 150 mg. Called into [facility].

## 2010-10-14 ENCOUNTER — Encounter: Payer: Self-pay | Admitting: Internal Medicine

## 2010-10-14 ENCOUNTER — Ambulatory Visit (INDEPENDENT_AMBULATORY_CARE_PROVIDER_SITE_OTHER): Payer: Self-pay | Admitting: Internal Medicine

## 2010-10-14 VITALS — BP 140/77 | HR 66 | Temp 97.1°F | Wt 327.8 lb

## 2010-10-14 DIAGNOSIS — Z23 Encounter for immunization: Secondary | ICD-10-CM

## 2010-10-14 DIAGNOSIS — Z0000 Encounter for general adult medical examination without abnormal findings: Secondary | ICD-10-CM

## 2010-10-14 DIAGNOSIS — D649 Anemia, unspecified: Secondary | ICD-10-CM

## 2010-10-14 DIAGNOSIS — E785 Hyperlipidemia, unspecified: Secondary | ICD-10-CM

## 2010-10-14 DIAGNOSIS — I10 Essential (primary) hypertension: Secondary | ICD-10-CM

## 2010-10-14 DIAGNOSIS — B354 Tinea corporis: Secondary | ICD-10-CM

## 2010-10-14 DIAGNOSIS — K420 Umbilical hernia with obstruction, without gangrene: Secondary | ICD-10-CM

## 2010-10-14 LAB — FERRITIN: Ferritin: 26 ng/mL (ref 10–291)

## 2010-10-14 MED ORDER — SELENIUM SULFIDE 1 % EX LOTN: TOPICAL_LOTION | CUTANEOUS | Status: DC

## 2010-10-14 NOTE — Patient Instructions (Addendum)
1) Please follow-up at the clinic in 1 month, at which time we will reevaluate your rash, abdominal pain, blood pressure. 2) You have been started on new medication(s), if you develop throat closing, tongue swelling, rash, please stop the medication and call the clinic at [phone] and go to the ER. 3) Please bring all of your medications in a bag to your next visit. 4) If you are diabetic, please bring your meter to your next visit. 5) If you develop fevers, chills, nausea, vomiting, significant worsening of your abdominal pain, go to the ER for evaluation. 6) If symptoms worsen, or new symptoms arise, please call the clinic or go to the ER. 7) Follow-up with the surgeons As soon as possible.

## 2010-10-14 NOTE — Progress Notes (Signed)
Subjective:    Patient ID: [name], female    DOB: 16-Apr-1956, 55 y.o.   MRN: [id number]  HPI Patient is a 55 year old female with past medical history of hyperlipidemia, hypertension, asthma who presents to the clinic today for the following :  1)  HTN - Patient does not check blood pressure regularly at home. Currently taking HCTZ 25mg , and Lisinopril 20mg . Denies headaches, dizziness, lightheadedness, chest pain, shortness of breath.    2) Snoring - Patient indicates a long history of snoring as confirmed by her husband.  Confirms daytime sleepiness, multiple nightly awakenings feeling as if she is gasping for air. Has not previously had a sleep study. Otherwise without history of depression, no feelings of isolation.   3) Vaginal yeast infection - vaginal discharge and itching have resolved, recently treated with a yeast infection.   4) Abdominal Pain - localized periumbilical pain since November 2011, seen in ER 07/2010, diagnosed with incarcerated umbilical hernia. Was recommended to follow-up with [name] [facility], but states she did not have the money for follow-up. Pain has been slowly worsening since that time. Now described as a sharp, nonradiating, periumbilical pain that is intermittent, with episodes lasting approximately several hours at a time with few hours in between episodes. Rated 8-9/10 in severity. Aggravated by activity, alleviated by rest and massaging area, bowel movement.Bowels are loose, but remain without blood.  No fevers, occasional chills, no malaise.   5) Rash - patient describes flat, mildly pruritic, nonerythematous, rash over posterior neck, back, legs x 1 month. Has not changed lotions, body washes, detergents, fabric softner. Unable to identify new allergens. No associated nasal congestion, post-nasal drip, cough. No fevers, no chills.    Review of Systems Per HPI.  Current Outpatient Medications Medication Sig  . cyclobenzaprine (FLEXERIL)  10 MG tablet Take 10 mg by mouth 2 (two) times daily as needed. For pain   . hydrochlorothiazide 25 MG tablet Take 1 tablet (25 mg total) by mouth daily.  [name] lisinopril (PRINIVIL,ZESTRIL) 20 MG tablet Take 1 tablet (20 mg total) by mouth daily.   Allergies Review of patient's allergies indicates no known allergies.  Past Medical History  Diagnosis Date  . Hypertension   . Hyperlipidemia   . Asthma   . Obesity   . Lower extremity edema     Chronic. 2D echo (2005) - EF 65%.  . Seasonal allergies   . Abnormal EKG 06/2003    History of inverted T waves V1-V3. Normal 2D echo (07/23/2003): LVEF 65%.  . Anemia     BL Hgb 11-12. Ferritin 14 - low normal (08/2007). Colonoscopy 2009 - external hemorrhoids (excellent prep)  . History of multiple pulmonary nodules     Incidental finding: CT Abd/ Pelvis (04/2010) - Several small lower lobe lung nodules, including one pure ground-glass pulmonary nodule measuring 8 mm in the left lower lobe.  Recommend follow-up chest CT (IV contrast preferred) in 6 months to document stability. //  CT Abd/ Pelvis (07/2010) -  3 mm RLL and  8 mm LLL nodule stable.  Other nodules unchanged, likely benign.  . 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 Followed by Sports Medicine, Dr. Jennette Kettle.  . Umbilical hernia with obstruction 04/2010    Noted on CT Abd/ pelvis (04/2010). Patient to follow with [facility].     Past Surgical History  Procedure Date  . Incision and drainage abscess anal 07/2008    I&D and debridgement of anorectal abscess  .  Hernia repair        Objective:   Physical Exam General: Vital signs reviewed and noted. Well-developed,well-nourished,in no acute distress; alert,appropriate and cooperative throughout examination. Head:  Normocephalic, atraumatic. Neck: No deformities, masses, or tenderness noted. Ears:  TM nonerythematous, not bulging, good light reflex bilaterally. Nose:  Mucous membranes not inflammed,  nonerythematous. Throat:  Oropharynx nonerythematous, no exudate appreciated.   Lungs:  Normal respiratory effort. Clear to auscultation BL without crackles or wheezes.  Heart:  RRR. S1 and S2 normal without gallop, murmur, or rubs.  Abdomen: BS normoactive. Soft, Nondistended, periumbilical TTP and BL lateral to umbilicus. No overt umbilical hernia appreciated. No rebound tenderness. No masses or organomegaly. Extremities: No pretibial edema. Skin: Hypopigmented, scaly patches on posterior neck, back.       Assessment & Plan:  Case and plan of care discussed with Dr. Donia Guiles.

## 2010-10-15 LAB — POCT I-STAT, CHEM 8
BUN: 12 mg/dL (ref 6–23)
Calcium, Ion: 1.13 mmol/L (ref 1.12–1.32)
Chloride: 106 mEq/L (ref 96–112)
Creatinine, Ser: 0.8 mg/dL (ref 0.4–1.2)
Glucose, Bld: 76 mg/dL (ref 70–99)
HCT: 39 % (ref 36.0–46.0)
Hemoglobin: 13.3 g/dL (ref 12.0–15.0)
Potassium: 3.7 mEq/L (ref 3.5–5.1)
Sodium: 140 mEq/L (ref 135–145)
TCO2: 23 mmol/L (ref 0–100)

## 2010-10-15 LAB — BASIC METABOLIC PANEL
BUN: 13 mg/dL (ref 6–23)
CO2: 26 mEq/L (ref 19–32)
Calcium: 10.2 mg/dL (ref 8.4–10.5)
Chloride: 103 mEq/L (ref 96–112)
Creat: 0.77 mg/dL (ref 0.40–1.20)
Glucose, Bld: 85 mg/dL (ref 70–99)
Potassium: 3.8 mEq/L (ref 3.5–5.3)
Sodium: 139 mEq/L (ref 135–145)

## 2010-10-15 LAB — D-DIMER, QUANTITATIVE (NOT AT ARMC): D-Dimer, Quant: 0.23 ug/mL-FEU (ref 0.00–0.48)

## 2010-10-15 LAB — POCT CARDIAC MARKERS
CKMB, poc: <1 ng/mL — ABNORMAL LOW (ref 1.0–8.0)
Myoglobin, poc: 49.4 ng/mL (ref 12–200)
Troponin i, poc: <0.05 ng/mL (ref 0.00–0.09)

## 2010-10-15 LAB — LIPID PANEL
Cholesterol: 208 mg/dL — ABNORMAL HIGH (ref 0–200)
HDL: 48 mg/dL (ref >39)
LDL Cholesterol: 142 mg/dL — ABNORMAL HIGH (ref 0–99)
Total CHOL/HDL Ratio: 4.3 Ratio
Triglycerides: 92 mg/dL (ref <150)
VLDL: 18 mg/dL (ref 0–40)

## 2010-10-22 MED ORDER — LISINOPRIL-HYDROCHLOROTHIAZIDE 20-25 MG PO TABS: 1.0000 | ORAL_TABLET | Freq: Every day | ORAL | Status: DC

## 2010-10-22 NOTE — Assessment & Plan Note (Signed)
Well controlled. Continue current regimen.  - Will change to a combination pill.

## 2010-10-22 NOTE — Assessment & Plan Note (Signed)
Versus eczema.  - Will try selenium sulfide as recommended by Dr. Reche Dixon, and have close follow-up to assess for improvement.

## 2010-10-22 NOTE — Assessment & Plan Note (Addendum)
Symptoms are concerning for possible obstruction. No evidence of strangulation in that pt without severe abd pain, without fevers, chills, is still stooling. However, consistency of stools has loosened.  - Will require very close follow-up with general surgery. - Patient instructed of signs concerning for strangulation and is instructed that if she develops fevers, chills, nausea, vomiting, significant worsening of abdominal pain, go to the ER for evaluation immediately.

## 2010-10-22 NOTE — Assessment & Plan Note (Signed)
-   Tdap today - Mammogram scholarship given already, pending scheduling and completion. - FLP today.

## 2010-10-27 ENCOUNTER — Telehealth: Payer: Self-pay | Admitting: *Deleted

## 2010-10-27 ENCOUNTER — Other Ambulatory Visit: Payer: Self-pay | Admitting: Internal Medicine

## 2010-10-27 DIAGNOSIS — Z1231 Encounter for screening mammogram for malignant neoplasm of breast: Secondary | ICD-10-CM

## 2010-10-27 NOTE — Telephone Encounter (Signed)
CALLED [name] , SPOKE WITH [name]  ABOUT THE REFERRAL FOR GENERAL SURGERY, PATIENT STATES SHE WILL BE BRINGING THE REST OF THE PAPER WORK TO [facility] THIS Friday (4-20-012). WHEN SHE GET APPROVED WITH [name], THEN I WILL  BE ABLE TO SEND THIS REFERRAL.  [name] NTII

## 2010-10-28 ENCOUNTER — Telehealth: Payer: Self-pay | Admitting: *Deleted

## 2010-10-28 NOTE — Telephone Encounter (Signed)
SPOKE WITH MS Knupp, SHE WAS INSTRUCTED TO FINISH THE BOTTLE OF LISINOPRIL  AND HER HCTZ,   THAT A NEW RX HAS BEEN CALLED IN TO HER DRUG STORE. THIS NEW RX WILL BE A COMBINATION PILL ,IT WILL HAVE BOTH OF THE MEDICATIONS IN ONE PILL. SHE UNDERSTOOD THIS INSTRUCTION AND GLAD THAT IT WILL HELP IN THE COST OF THE MEDICATION.  LELA STURDIVANT NTII

## 2010-10-28 NOTE — Telephone Encounter (Signed)
CALLED [name] AND READ THE INFO THAT WAS WRITTEN  ABOUT HER RESULTS AND WHAT SHE WANTED HER TO DO AS FAR AS HER MEDICATION. WILL WAIT UNTIL SURGERY IS DONE TO START IRON. PATIENT UNDERSTOOD THIS INFO.  SHE IS WAITING FOR PAPERS TO COME SO SHE CAN GET THE GCCN CARD, SO WE CAN  GET THE GENERAL SURGERY REFERRAL DONE. LELA STUDIVANT NTII

## 2010-10-28 NOTE — Telephone Encounter (Signed)
Message copied by Theotis Barrio on Thu Oct 28, 2010 12:01 PM ------      Message from: Elyn Peers      Created: Sat Oct 23, 2010 10:26 AM       Hi Airica Schwartzkopf, can you please call this patient and let her know that after she finishes her current lisinopril and hydrochlorothiazide, I will be switching her to a combination pill that includes both medications in one pill. I have already sent this prescription to her pharmacy. So, again, once her current Lisinopril and Hydrochlorothiazide have finished, she is to stop both of those medications, and pick a new prescription for a lisinopril-hhydrochlorothiazide pill (that is a combination pill including both medications in 1). This will mean she gets the same medication, in one pill, and will be less expensive for her.            I tried to call yesterday and today, without success.            Thank you!            Sincerely,            Johnette Abraham, D.O.

## 2010-11-05 ENCOUNTER — Ambulatory Visit (HOSPITAL_COMMUNITY)
Admission: RE | Admit: 2010-11-05 | Discharge: 2010-11-05 | Disposition: A | Discharge status: Home / Self Care | Payer: Self-pay | Source: Ambulatory Visit | Attending: Internal Medicine | Admitting: Internal Medicine

## 2010-11-05 DIAGNOSIS — Z1231 Encounter for screening mammogram for malignant neoplasm of breast: Secondary | ICD-10-CM

## 2010-11-08 LAB — HM MAMMOGRAPHY

## 2010-11-23 NOTE — Discharge Summary (Signed)
[name], [name]                ACCOUNT NO.:  [id number]   MEDICAL RECORD NO.:  [id number]          PATIENT TYPE:  INP   LOCATION:  5509                         FACILITY:  [facility]   PHYSICIAN:  Eliseo Gum, M.D.   DATE OF BIRTH:  1956-04-16   DATE OF ADMISSION:  01/04/2008  DATE OF DISCHARGE:  01/05/2008                               DISCHARGE SUMMARY   DISCHARGE DIAGNOSES:  As follows;  1. Shortness of breath with flu-like symptoms.  2. Chest pain.  3. Hypokalemia.  4. Hypertension.   DISCHARGE MEDICATIONS:  1. Loratadine 10 mg p.o. daily.  2. Albuterol 90 mcg MDI 1-2 puffs every 4-6 hours p.r.n. shortness of      breath.  3. Lisinopril 20 mg p.o. daily  4. Hydrochlorothiazide 25 mg p.o. daily.  5. Fluticasone 15 mcg/ACT 1 spray in each nostril b.i.d.  6. Tamiflu 75 mg p.o. b.i.d. x4 days.  7. Tylenol 650 mg p.o. every 6 hours p.r.n. pain or fever.  8. Guaifenesin 200 mg p.o. every 4 hours p.r.n. cough.  9. Prednisone 10 mg taper 6, p.o. day one 5 pills, p.o. day two 4      pills, p.o. day three 3 pills, p.o. day four 2 pills, p.o. day five      1 pill, and p.o. day 6.   DISPOSITION:  The patient is to follow up in the outpatient clinic.  I  have flagged the after hours triage, who should call the patient with a  followup appointment in 7-10 days.  The patient is to go home with  respiratory isolation for 4 more days until she finishes her 4 many days  of Tamiflu.  Her respiratory status should be reassessed at followup.  Of note, the patient cannot afford Advair, so this should be taken up  with Marcelle Smiling in the outpatient clinic, and see if she could qualify for  medication assistance.   PROCEDURE PERFORMED:  The patient had a chest x-ray done on January 04, 2008, showed one mild chronic bronchitic changes.  No infiltrates,  edema, or effusions.   There were no consultations made.   Brief H&P is as follows;   CHIEF COMPLAINT:  Flu-like symptoms.   HISTORY OF  PRESENT ILLNESS:  [name] is a 55 year old African American  woman with past medical history of obesity, hypertension,  hyperlipidemia, asthma who presents with 4 days of myalgias, weakness,  nausea, dry cough and tightness in her chest with some wheezing.  She  endorses being hot and cold, but her highest temperature was 99.5.  She  denies rhinorrhea or nasal congestion, but has bilateral frontal  headaches.  She denies sore throat.  Has dry cough, no sputum.  Also,  endorses chest tightness and chest pain which is persistent, but worse  when she coughs.  She denies palpitations.  She has some degree of  shortness of breath and wheezing.  She has no sick contacts.   For allergies, past medical history, past surgical history, home  medications, substance history, and family history, please see hospital  chart.   REVIEW OF SYSTEMS:  As seen in HPI.   PHYSICAL EXAMINATION:  VITAL SIGNS:  Temperature equals 99.3, blood  pressure equals 132/82, pulse equals 79, respiratory rate equals 22, and  O2 sats equals 95% on room air.  GENERAL:  No acute distress, surgical mask is in place.  HEENT:  Eyes, PERRLA, EOMI, anicteric.  ENT:  Oropharynx is clear, MMM.  NECK:  Supple.  RESPIRATORY:  No rhonchi or rales.  Wheezing, moderately bilateral good  air movement though.  CVA:  Regular rate and rhythm.  No murmurs, rubs, or gallops.  GI:  Soft, bowel sounds positive, NT, and ND.  EXTREMITIES:  Bilateral legs tender to very light touch on shins.  No  edema, redness, warmth, or palpable cords.  SKIN:  Warm, no rashes.  NEURO:  Alert and oriented x3, grossly nonfocal.   LABORATORY DATA:  Initial labs include a sodium 139, potassium 3.2,  chloride 105, bicarb 22, BUN 8, creatinine 0.9, glucose 102, anion gap  12, bilirubin 0.5, alkaline phosphatase 85, AST 23, ALT less than 8,  protein 6.9, albumin 3.5, and calcium 9.  Chest x-ray showed mild  chronic bronchitic changes.  UA, small leukocytes.   Micro, many squamous  cells indicating a poor sample, 3-6 wbc's, and many bacteria.  The  patient is asymptomatic.   HOSPITAL COURSE BY PROBLEMS:  1. Shortness of breath, flu-like symptoms, and asthma exacerbation.      The patient was admitted to the hospital.  Secondary to flu-like      symptoms, she was put on droplet precautions and check for H1N1.      She received Tamiflu treatment doses.  She will continue 4 days in      the outpatient setting with home respiratory isolation.  Her      husband has been given a work excuse, so that he may at home to      take care of the kids.  The patient was also given albuterol and      Atrovent nebulizers.  She was also given Solu-Medrol 80 mg IV every      12 hours x2 doses and then transitioned to a prednisone taper.  She      will be sent home on prednisone 60 mg taper.  The patient was also      continued on her loratadine and fluticasone.  She will be      discharged with guaifenesin for any cough and Tylenol for fever or      pain.  The patient will follow up in the outpatient clinic to      reassess her respiratory status.  Of note, she should see Marcelle Smiling      to get followup in regards to getting some Advair for her asthma.  2. Chest pain, likely secondary to the patient's flu-like symptoms,      and asthma exacerbation.  The patient's cardiac enzymes are      negative x3 and a negative EKG.  She will be sent home on      medications mentioned above.  3. Hypokalemia.  The patient's hypokalemia was likely secondary to      nebulizer treatments.  Her potassium was repleted and was normal at      the time of discharge.  This should be reassessed in the outpatient      setting, given the patient is on an ACE inhibitor.  4. Hypertension.  The patient's blood pressure was relatively      controlled during hospital  stay, given the acute setting.  She will      be continued on lisinopril and HCTZ on discharge.   DISCHARGE LABS:  Include  blood cultures negative thus far.  Sodium 140,  potassium 4.2, chloride 107, bicarb 23, glucose 128, BUN 7, creatinine  0.7, and calcium 9.5.  WBC equals 3.7, hemoglobin 11.9, hematocrit 36.2,  and platelets 266.   DISCHARGE VITALS:  Include temperature 98.2, pulse 65, respiratory rate  18, blood pressure 143/84, and O2 sats 98% on 2 liters.      Rufina Falco, M.D.  Electronically Signed      Eliseo Gum, M.D.  Electronically Signed    JY/MEDQ  D:  01/05/2008  T:  01/06/2008  Job:  [id number]

## 2010-11-23 NOTE — Consult Note (Signed)
[name], [name]                ACCOUNT NO.:  [id number]   MEDICAL RECORD NO.:  [id number]          PATIENT TYPE:  INP   LOCATION:  NA                           FACILITY:  [facility]   PHYSICIAN:  Adolph Pollack, M.D.DATE OF BIRTH:  [birth year]   DATE OF CONSULTATION:  07/31/2008  DATE OF DISCHARGE:                                 CONSULTATION   REQUESTING PHYSICIAN:  Lucy Antigua, MD at the [facility].   CONSULTING SURGEON:  Adolph Pollack, MD   REASON FOR CONSULTATION:  Perirectal abscess.   HISTORY OF PRESENT ILLNESS:  [name] is a 55 year old black female  with a history of hypertension who began having rectal pain several days  ago.  On Tuesday, she went to the emergency department to have this  checked for which they said that she had a boil; however, did not need  incision and drainage at this time.  Therefore, they gave her a  prescription for Bactrim as well as pain medicine.  The following day  when the patient started taking her Bactrim, she noticed that this area  began to spontaneously drain.  She states that while at home, she was  having sweat; however, she was not having any fever or chills.  Today,  the patient presented to the [facility] and was found to have a  spontaneously draining perirectal abscess and at that time, we were  called for incision and drainage.   REVIEW OF SYSTEMS:  Please see HPI.  Otherwise, all other systems are  negative.   PAST MEDICAL HISTORY:  Hypertension.   SOCIAL HISTORY:  The patient is married.  She denies tobacco and  alcohol.   ALLERGIES:  NKDA.   MEDICATIONS:  1. Loratadine 10 mg daily.  2. Albuterol 90 mcg as needed.  3. Lisinopril 20 mg daily.  4. Hydrochlorothiazide 25 mg a day.  5. Advair Diskus 25-50 puffs, one puffs two times a day.  6. Vicodin 5/325, 1-2 tablets q.4-6 h. p.r.n. pain.  7. Flexeril 10 mg one t.i.d.  8. Diclofenac sodium 75 mg 1 b.i.d.   PHYSICAL EXAMINATION:  GENERAL:  [name] is  a 55 year old black female  who is obese, but very pleasant and is currently lying on the bed in  mild distress.  VITAL SIGNS:  Temperature 98.6, pulse 81, blood pressure 115/72.  HEENT:  Head is normocephalic and atraumatic.  Sclerae are noninjected.  Pupils are equal, round, and reactive to light.  Ear and nose without  any obvious masses or lesions.  No rhinorrhea.  Mouth is pink and moist.  Throat shows no exudate.  HEART:  Regular rate and rhythm.  Normal S1 and S2.  No murmurs,  gallops, or rubs are noted.  +2 carotid, radial, and pedal pulses  bilaterally.  LUNGS:  Clear to auscultation bilaterally.  No wheezes, rhonchi, or  rales noted.  Respiratory effort is nonlabored.  ABDOMEN:  Soft, morbidly obese, nontender with active bowel sounds.  MUSCULOSKELETAL:  All four extremities are symmetrical and with no  cyanosis, clubbing, or edema.  SKIN:  The patient does  have a perirectal abscess that is just on her  left buttock cheek.  It does have a small opening that is approximately  0.5 x 0.5 cm with spontaneous purulent drainage.  There is some slight  surrounding induration, but for the most part is very fluctuant.  The  patient did have an I and D of this area and it was opened up to  approximately 1.5 cm x 2 cm.  After this was done, all purulent drainage  and necrotic tissue was debrided and a dry gauze packing was placed.  Labs and diagnostics were none.   IMPRESSION:  1. Perirectal abscess which has been incision and drainage.  2. Hypertension.   PLAN:  At this time, an I and D of perirectal abscess was performed.  At  this time, the abscess is adequately drained and we feel that the  patient is able to go home.  Currently, she has a dry packing in place  and she is informed to leave this until tomorrow.  At that time, she may  remove this and get in the shower.  Warm water soaks to the area twice a  day.  She is also to continue taking her Bactrim that she was given at   the emergency department which she has a 7 days prescription for.  She  is also given a prescription for pain medicines as needed.  She is to  follow up with myself at the [facility] at our office, which is [facility] on Tuesday, August 05, 2008, at 2:15 p.m. for a wound  check.  Otherwise, she is informed that she needs to return to the  emergency department for clinic if she has worsening pain or problem  with this area or excessive bleeding.      Letha Cape, PA      Adolph Pollack, M.D.  Electronically Signed    KEO/MEDQ  D:  07/31/2008  T:  08/01/2008  Job:  [id number]   cc:   Alvester Morin, M.D.

## 2010-11-23 NOTE — Op Note (Signed)
NAMEROSEMARIA, INABINET                ACCOUNT NO.:  [id number]   MEDICAL RECORD NO.:  [id number]           PATIENT TYPE:   LOCATION:                                 FACILITY:   PHYSICIAN:  Adolph Pollack, M.D.DATE OF BIRTH:  1956/02/21   DATE OF PROCEDURE:  07/31/2008  DATE OF DISCHARGE:                               OPERATIVE REPORT   PREOPERATIVE DIAGNOSIS:  Left anorectal abscess.   POSTOPERATIVE DIAGNOSIS:  Left anorectal abscess.   PROCEDURE:  Complex incision and drainage and debridement of left  anorectal abscess.   SURGEON:  Adolph Pollack, MD   ASSISTANT:  Letha Cape, PA   ANESTHESIA:  Lidocaine and local.   INDICATIONS:  Ms. Sawtell has been having pain and swelling in the left  anorectal area.  She was seen in the emergency department and given  antibiotics (Bactrim) as well as some Percocet.  She has some  spontaneous drainage and has continued pain and presented to the  [facility] Clinic.  It was noted to have draining of her rectal  abscess and we were asked to see her.   TECHNIQUE:  She was placed in left lateral Sims position.  The area of  right buttock were retracted exposing the draining area, which was  indurated and tender.  The area was sterilely prepped and anesthetized.  I then made a large elliptical incision, full-thickness, and evacuated  purulent material.  I also debrided some necrotic tissue.  I got down to  a good bleeding base.  I then packed the wound and applied a bulky  dressing.  She tolerated the procedure well without any apparent  complications.  She was given dressing change instructions and will  continue to take her Bactrim and Percocet.  We will see her back in the  office for followup.      Adolph Pollack, M.D.  Electronically Signed     TJR/MEDQ  D:  08/01/2008  T:  08/01/2008  Job:  [id number]

## 2010-12-10 ENCOUNTER — Emergency Department (HOSPITAL_COMMUNITY)
Admission: EM | Admit: 2010-12-10 | Discharge: 2010-12-10 | Disposition: A | Discharge status: Home / Self Care | Payer: Self-pay | Attending: Emergency Medicine | Admitting: Emergency Medicine

## 2010-12-10 ENCOUNTER — Emergency Department (HOSPITAL_COMMUNITY): Payer: Self-pay

## 2010-12-10 DIAGNOSIS — I10 Essential (primary) hypertension: Secondary | ICD-10-CM | POA: Insufficient documentation

## 2010-12-10 DIAGNOSIS — R079 Chest pain, unspecified: Secondary | ICD-10-CM | POA: Insufficient documentation

## 2010-12-10 DIAGNOSIS — J3489 Other specified disorders of nose and nasal sinuses: Secondary | ICD-10-CM | POA: Insufficient documentation

## 2010-12-10 DIAGNOSIS — M25569 Pain in unspecified knee: Secondary | ICD-10-CM | POA: Insufficient documentation

## 2010-12-10 DIAGNOSIS — G8929 Other chronic pain: Secondary | ICD-10-CM | POA: Insufficient documentation

## 2010-12-10 DIAGNOSIS — R0602 Shortness of breath: Secondary | ICD-10-CM | POA: Insufficient documentation

## 2010-12-10 DIAGNOSIS — J45909 Unspecified asthma, uncomplicated: Secondary | ICD-10-CM | POA: Insufficient documentation

## 2010-12-10 DIAGNOSIS — R070 Pain in throat: Secondary | ICD-10-CM | POA: Insufficient documentation

## 2010-12-10 DIAGNOSIS — Z79899 Other long term (current) drug therapy: Secondary | ICD-10-CM | POA: Insufficient documentation

## 2010-12-10 DIAGNOSIS — R059 Cough, unspecified: Secondary | ICD-10-CM | POA: Insufficient documentation

## 2010-12-10 DIAGNOSIS — R064 Hyperventilation: Secondary | ICD-10-CM | POA: Insufficient documentation

## 2010-12-10 DIAGNOSIS — R05 Cough: Secondary | ICD-10-CM | POA: Insufficient documentation

## 2010-12-10 DIAGNOSIS — R9431 Abnormal electrocardiogram [ECG] [EKG]: Secondary | ICD-10-CM | POA: Insufficient documentation

## 2010-12-10 DIAGNOSIS — M1990 Unspecified osteoarthritis, unspecified site: Secondary | ICD-10-CM | POA: Insufficient documentation

## 2010-12-10 HISTORY — PX: INCISIONAL HERNIA REPAIR: SHX193

## 2010-12-14 ENCOUNTER — Telehealth: Payer: Self-pay | Admitting: *Deleted

## 2010-12-14 ENCOUNTER — Encounter: Payer: Self-pay | Admitting: Internal Medicine

## 2010-12-14 ENCOUNTER — Ambulatory Visit (INDEPENDENT_AMBULATORY_CARE_PROVIDER_SITE_OTHER): Payer: Self-pay | Admitting: Internal Medicine

## 2010-12-14 DIAGNOSIS — I10 Essential (primary) hypertension: Secondary | ICD-10-CM

## 2010-12-14 DIAGNOSIS — R05 Cough: Secondary | ICD-10-CM | POA: Insufficient documentation

## 2010-12-14 DIAGNOSIS — J45909 Unspecified asthma, uncomplicated: Secondary | ICD-10-CM

## 2010-12-14 DIAGNOSIS — R059 Cough, unspecified: Secondary | ICD-10-CM

## 2010-12-14 MED ORDER — HYDROCOD POLST-CHLORPHEN POLST 10-8 MG/5ML PO LQCR: 5.0000 mL | Freq: Two times a day (BID) | ORAL | Status: DC | PRN

## 2010-12-14 MED ORDER — ALBUTEROL SULFATE HFA 108 (90 BASE) MCG/ACT IN AERS: 2.0000 | INHALATION_SPRAY | Freq: Four times a day (QID) | RESPIRATORY_TRACT | Status: DC | PRN

## 2010-12-14 NOTE — Progress Notes (Signed)
Subjective:    Patient ID: [name], female    DOB: 1955/08/17, 55 y.o.   MRN: [id number]  HPI Comments: 10 days ago patient started with cough and wheezing, congestion, and pain in low back pain and burning in chest and throat and tiching.  The cough was productive of yellow-green mucus.  Patient has yellow mucus from nose.  Some blood in nasal mucus.  Patient has felt short of breath. Temperatures up to 99.7.  No N/V/D.  Patient has been constipated.  Abdomen hurts when she coughs.    Pt was in ED on 12/10/10 (4 days ago) and was given toradol shot for her back pain which caused her heart rate to race and hands to shake - the patient thinks she had a reaction to the toradol.  She was given albuterol nebulizer which she was unable to complete because of the tachycardia and hands shaking.  The patient states she thinks that her reaction didn't come from the nebulizer as she has tolerated these without reaction ni the past.  During the Ed visit her O2 sats were over 96%.  She was sent home with a z-pak, prednisone taper, tussionex, and percocet and told to follow-up with her PCP.  Since being in the ED she feels like her symptoms have not changed much - though the cough may be a little better.  Continues to have productive cough, congestion and other symptoms.  Patient has been able to eat, just not as much as usual.  She does not have trouble drinking liquids at all.  She does not have an albuterol inhaler and requests a refill.    Breathing Problem She complains of difficulty breathing. Pertinent negatives include no chest pain or sneezing.      Review of Systems  Constitutional: Negative for chills and diaphoresis.  HENT: Negative for sneezing and neck pain.   Eyes: Negative for visual disturbance.  Respiratory: Negative for chest tightness.   Cardiovascular: Negative for chest pain, palpitations and leg swelling.  Gastrointestinal: Negative for abdominal distention.  Genitourinary:  Negative for dysuria.  Neurological: Positive for numbness. Negative for dizziness, syncope and weakness.  Hematological: Negative for adenopathy.  Psychiatric/Behavioral: Negative for confusion.       Objective:   Physical Exam  Constitutional: She is oriented to person, place, and time. No distress.  HENT:  Head: Normocephalic and atraumatic.  Right Ear: External ear normal.  Left Ear: External ear normal.  Nose: Nose normal.  Mouth/Throat: Oropharynx is clear and moist. No oropharyngeal exudate.       Mucous membranes moist  Eyes: Conjunctivae and EOM are normal. Pupils are equal, round, and reactive to light. Right eye exhibits no discharge. Left eye exhibits no discharge.  Neck: Normal range of motion. Neck supple.  Cardiovascular: Normal rate, regular rhythm and intact distal pulses.   No murmur heard. Pulmonary/Chest: Effort normal. She has wheezes.       Able to speak in full sentences  Abdominal: Soft. Bowel sounds are normal. She exhibits no distension and no mass. There is tenderness. There is no rebound and no guarding.       Mild tenderness at hernia  Musculoskeletal: Normal range of motion. She exhibits tenderness. She exhibits no edema.  Neurological: She is alert and oriented to person, place, and time. No cranial nerve deficit. Coordination normal.  Skin: Skin is warm and dry.       Bilateral anterior tibial skin darkening  Psychiatric: She has a normal mood and  affect. Her behavior is normal. Judgment and thought content normal.          Assessment & Plan:

## 2010-12-14 NOTE — Telephone Encounter (Signed)
States she was recently seen in ED and needs med- albuterol refilled- she continues to have problem that took her to ED. appt is given for 1415 today. She sounds slightly congested on phone, no distress noted and pt denies distress at this time, she is reminded if she feels the need for emergent care before the appt time she may call 911 or go to ED or urg care. She is agreeable.

## 2010-12-14 NOTE — Patient Instructions (Addendum)
Finish the antibiotic and steroids. Pick up your prescription for albuterol and tussionex (if needed). Keep your appointment with Dr. Saralyn Pilar. If you still feel badly on Friday with no improvement or worsening, please call the clinic (in the AM). Please bring all of your medicines with you to your next appointment.

## 2010-12-14 NOTE — Assessment & Plan Note (Addendum)
Saw the patient with Dr. Aundria Rud.  Patient on appropriate medications for an asthma exacerbation.  Unfortunately the patient cannot afford a better medicine to control her asthma (advair).  Will have patient complete the steroid taper and azithromycin. Will also refill albuterol inhaler. Would expect improvement in the next 2-3 days.  IF patient does not improve by the end of the week, we will have her return for further evaluation, including chest x-ray.  Otherwise f/u with PCP at already scheduled appointment.

## 2010-12-14 NOTE — Assessment & Plan Note (Addendum)
Her complaints are likely related to an asthma exacerbation.  Please see above problem.

## 2010-12-14 NOTE — Assessment & Plan Note (Signed)
Above goal today in setting acute illness. Previously with moderate control.  Pt will f/u with PCP at the end of the month and will recheck BP - she may need titration of dose at that time if still elevated.

## 2010-12-30 ENCOUNTER — Encounter: Payer: Self-pay | Admitting: Internal Medicine

## 2010-12-30 ENCOUNTER — Ambulatory Visit (HOSPITAL_BASED_OUTPATIENT_CLINIC_OR_DEPARTMENT_OTHER)
Admission: RE | Admit: 2010-12-30 | Discharge: 2010-12-30 | Disposition: A | Discharge status: Home / Self Care | Payer: Self-pay | Source: Ambulatory Visit | Attending: Family Medicine | Admitting: Family Medicine

## 2010-12-30 ENCOUNTER — Encounter: Payer: Self-pay | Admitting: Family Medicine

## 2010-12-30 ENCOUNTER — Inpatient Hospital Stay (HOSPITAL_COMMUNITY)
Admission: AD | Admit: 2010-12-30 | Discharge: 2011-01-03 | DRG: 354 | Disposition: A | Discharge status: Home Health | Payer: Medicaid Other | Source: Ambulatory Visit | Attending: Internal Medicine | Admitting: Internal Medicine

## 2010-12-30 ENCOUNTER — Ambulatory Visit (INDEPENDENT_AMBULATORY_CARE_PROVIDER_SITE_OTHER): Payer: Self-pay | Admitting: Internal Medicine

## 2010-12-30 ENCOUNTER — Ambulatory Visit (INDEPENDENT_AMBULATORY_CARE_PROVIDER_SITE_OTHER): Payer: Self-pay | Admitting: Family Medicine

## 2010-12-30 ENCOUNTER — Encounter (HOSPITAL_COMMUNITY): Payer: Self-pay | Admitting: Radiology

## 2010-12-30 ENCOUNTER — Inpatient Hospital Stay (HOSPITAL_COMMUNITY): Payer: Medicaid Other

## 2010-12-30 VITALS — BP 149/85 | HR 71 | Temp 98.2°F | Ht 64.0 in | Wt 326.0 lb

## 2010-12-30 DIAGNOSIS — R109 Unspecified abdominal pain: Secondary | ICD-10-CM

## 2010-12-30 DIAGNOSIS — M1710 Unilateral primary osteoarthritis, unspecified knee: Secondary | ICD-10-CM | POA: Diagnosis present

## 2010-12-30 DIAGNOSIS — K5900 Constipation, unspecified: Secondary | ICD-10-CM | POA: Diagnosis not present

## 2010-12-30 DIAGNOSIS — Y9210 Unspecified residential institution as the place of occurrence of the external cause: Secondary | ICD-10-CM | POA: Diagnosis not present

## 2010-12-30 DIAGNOSIS — I10 Essential (primary) hypertension: Secondary | ICD-10-CM | POA: Diagnosis present

## 2010-12-30 DIAGNOSIS — M25562 Pain in left knee: Secondary | ICD-10-CM

## 2010-12-30 DIAGNOSIS — Z87891 Personal history of nicotine dependence: Secondary | ICD-10-CM

## 2010-12-30 DIAGNOSIS — M51379 Other intervertebral disc degeneration, lumbosacral region without mention of lumbar back pain or lower extremity pain: Secondary | ICD-10-CM | POA: Diagnosis present

## 2010-12-30 DIAGNOSIS — M5137 Other intervertebral disc degeneration, lumbosacral region: Secondary | ICD-10-CM | POA: Diagnosis present

## 2010-12-30 DIAGNOSIS — K420 Umbilical hernia with obstruction, without gangrene: Principal | ICD-10-CM | POA: Diagnosis present

## 2010-12-30 DIAGNOSIS — M25569 Pain in unspecified knee: Secondary | ICD-10-CM

## 2010-12-30 DIAGNOSIS — J9819 Other pulmonary collapse: Secondary | ICD-10-CM | POA: Diagnosis not present

## 2010-12-30 DIAGNOSIS — E876 Hypokalemia: Secondary | ICD-10-CM | POA: Diagnosis not present

## 2010-12-30 DIAGNOSIS — J984 Other disorders of lung: Secondary | ICD-10-CM | POA: Diagnosis present

## 2010-12-30 DIAGNOSIS — IMO0002 Reserved for concepts with insufficient information to code with codable children: Secondary | ICD-10-CM | POA: Insufficient documentation

## 2010-12-30 DIAGNOSIS — D509 Iron deficiency anemia, unspecified: Secondary | ICD-10-CM | POA: Diagnosis present

## 2010-12-30 DIAGNOSIS — E785 Hyperlipidemia, unspecified: Secondary | ICD-10-CM | POA: Diagnosis present

## 2010-12-30 DIAGNOSIS — J45909 Unspecified asthma, uncomplicated: Secondary | ICD-10-CM | POA: Diagnosis present

## 2010-12-30 DIAGNOSIS — R1030 Lower abdominal pain, unspecified: Secondary | ICD-10-CM | POA: Insufficient documentation

## 2010-12-30 DIAGNOSIS — T40605A Adverse effect of unspecified narcotics, initial encounter: Secondary | ICD-10-CM | POA: Diagnosis not present

## 2010-12-30 LAB — CBC
HCT: 34.5 % — ABNORMAL LOW (ref 36.0–46.0)
Hemoglobin: 11.3 g/dL — ABNORMAL LOW (ref 12.0–15.0)
MCH: 28.8 pg (ref 26.0–34.0)
MCHC: 32.8 g/dL (ref 30.0–36.0)
MCV: 87.8 fL (ref 78.0–100.0)
Platelets: 352 10*3/uL (ref 150–400)
RBC: 3.93 MIL/uL (ref 3.87–5.11)
RDW: 14.4 % (ref 11.5–15.5)
WBC: 4.8 10*3/uL (ref 4.0–10.5)

## 2010-12-30 LAB — PROTIME-INR
INR: 1.01 (ref 0.00–1.49)
Prothrombin Time: 13.5 seconds (ref 11.6–15.2)

## 2010-12-30 LAB — COMPREHENSIVE METABOLIC PANEL
ALT: 8 U/L (ref 0–35)
AST: 11 U/L (ref 0–37)
Albumin: 3.5 g/dL (ref 3.5–5.2)
Alkaline Phosphatase: 105 U/L (ref 39–117)
BUN: 12 mg/dL (ref 6–23)
CO2: 30 mEq/L (ref 19–32)
Calcium: 9.9 mg/dL (ref 8.4–10.5)
Chloride: 103 mEq/L (ref 96–112)
Creatinine, Ser: 0.64 mg/dL (ref 0.50–1.10)
GFR calc Af Amer: >60 mL/min (ref >60)
GFR calc non Af Amer: >60 mL/min (ref >60)
Glucose, Bld: 84 mg/dL (ref 70–99)
Potassium: 3.8 mEq/L (ref 3.5–5.1)
Sodium: 140 mEq/L (ref 135–145)
Total Bilirubin: 0.4 mg/dL (ref 0.3–1.2)
Total Protein: 6.9 g/dL (ref 6.0–8.3)

## 2010-12-30 LAB — URINE MICROSCOPIC-ADD ON

## 2010-12-30 LAB — DIFFERENTIAL
Basophils Absolute: 0 10*3/uL (ref 0.0–0.1)
Basophils Relative: 1 % (ref 0–1)
Eosinophils Absolute: 0.2 10*3/uL (ref 0.0–0.7)
Eosinophils Relative: 4 % (ref 0–5)
Lymphocytes Relative: 39 % (ref 12–46)
Lymphs Abs: 1.9 10*3/uL (ref 0.7–4.0)
Monocytes Absolute: 0.3 10*3/uL (ref 0.1–1.0)
Monocytes Relative: 6 % (ref 3–12)
Neutro Abs: 2.4 10*3/uL (ref 1.7–7.7)
Neutrophils Relative %: 50 % (ref 43–77)

## 2010-12-30 LAB — URINALYSIS, ROUTINE W REFLEX MICROSCOPIC
Bilirubin Urine: NEGATIVE
Glucose, UA: NEGATIVE mg/dL
Hgb urine dipstick: NEGATIVE
Ketones, ur: NEGATIVE mg/dL
Nitrite: NEGATIVE
Protein, ur: NEGATIVE mg/dL
Specific Gravity, Urine: 1.023 (ref 1.005–1.030)
Urobilinogen, UA: 1 mg/dL (ref 0.0–1.0)
pH: 7 (ref 5.0–8.0)

## 2010-12-30 LAB — LIPASE, BLOOD: Lipase: 18 U/L (ref 11–59)

## 2010-12-30 LAB — APTT: aPTT: 29 seconds (ref 24–37)

## 2010-12-30 MED ORDER — IOHEXOL 300 MG/ML  SOLN
110.0000 mL | Freq: Once | INTRAMUSCULAR | Status: AC | PRN
  Administered 2010-12-30: 110 mL via INTRAVENOUS

## 2010-12-30 NOTE — Progress Notes (Unsigned)
Hospital Admission Note Date: 12/30/2010  Patient name:  [name]  Medical record number:  [id number] Date of birth:  September 01, 1955  Age: 55 y.o. Gender:  female PCP:    Suszanne Finch, DO  Medical Service:   Internal Medicine Teaching Service   Attending physician:  Dr. Rogelia Boga First Contact:   Dr.    Anselm Jungling  Pager: [phone]  Second Contact:   Dr.   Eben Burow             Pager: [phone] After Hours:    First Contact   Pager: [phone]      Second Contact  Pager: [phone]   Chief Complaint: abdominal pain  History of Present Illness: Pt is a 55 y.o. female with a PMHx of HTN, HLD, morbid obesity, and known umbilical hernia who presents to the clinic for progressively worsening periumbilical/ RUQ abdominal pain, which has been ongoing since November 2011. Pt was seen in ER 07/2010, diagnosed with likely incarcerated umbilical hernia. Was recommended to follow-up with [facility] [name] [facility] and given an appointment in 06/2010, but did follow-up secondary to financial concerns. Patient was then seen in [facility] in 10/2010, for worsening pain, without evidence of peritonitis or strangulation, was again set up for urgent surgical follow-up. However, pt then decided to wait to be approved with orange card prior to recommended follow-up, and only turned in paperwork allowing for approval 2 weeks ago, therefore, she is still awaiting an appointment for surgical follow-up. When pt was seen in clinic today, she states that the pain has continued to worsen, is now a constant, sharp abdominal pain, rated 9/10 in severity. Pain is aggravated by activity, coughing, and eating. Having a difficult time keeping food down. Mildly improved with direct massage. Now also having less frequent bowel movements, occuring only every 3-4 day from prior daily movements. Has not noted overt blood in her stools. Confirms occasional nausea, but no overt vomiting. Denies fevers, chills, malaise, no dysuria, hematuria. Otherwise, pt  remains without chest pain, difficulty breathing, shortness of breath, headaches, vision changes, cough. Confirms left leg pain for which she is followed by sports medicine.       Current Outpatient Medications: Current Outpatient Prescriptions  Medication Sig Dispense Refill  . albuterol (PROVENTIL HFA;VENTOLIN HFA) 108 (90 BASE) MCG/ACT inhaler Inhale 2 puffs into the lungs every 6 (six) hours as needed for wheezing or shortness of breath.  1 Inhaler  5  . chlorpheniramine-HYDROcodone (TUSSIONEX PENNKINETIC ER) 10-8 MG/5ML LQCR Take 5 mLs by mouth every 12 (twelve) hours as needed.  50 mL  0  . cyclobenzaprine (FLEXERIL) 10 MG tablet Take 10 mg by mouth 2 (two) times daily as needed. For pain       . lisinopril-hydrochlorothiazide (PRINZIDE,ZESTORETIC) 20-25 MG per tablet Take 1 tablet by mouth daily.  30 tablet  4  . DISCONTD: azithromycin (ZITHROMAX) 250 MG tablet Take 2 tablets by mouth on day 1, followed by 1 tablet by mouth daily for 4 days. Last dose 12/16/10       . DISCONTD: predniSONE (DELTASONE) 10 MG tablet Take 10 mg by mouth daily. Per taper, start 60mg  and decreased 10mg  until done.  Last day 12/17/10       . DISCONTD: selenium sulfide (SELSUN) 1 % LOTN Apply lotion to affected areas, lather with small amounts of water, leave on skin for 10 minutes, then rinse thoroughly. Apply every day for 7 days; rub foam into affected skin areas twice daily. Do not use on broken or  inflamed skin. May damage jewelry, remove before treatment.  118 mL  1    Allergies: Toradol  Past Medical History: Past Medical History  Diagnosis Date  . Hypertension   . Hyperlipidemia   . Asthma   . Obesity   . Lower extremity edema     Chronic. 2D echo (2005) - EF 65%.  . Seasonal allergies   . Abnormal EKG 06/2003    History of inverted T waves V1-V3. Normal 2D echo (07/23/2003): LVEF 65%.  . Anemia     BL Hgb 11-12. Ferritin 14 - low normal (08/2007). Colonoscopy 2009 - external hemorrhoids  (excellent prep)  . History of multiple pulmonary nodules     Incidental finding: CT Abd/ Pelvis (04/2010) - Several small lower lobe lung nodules, including one pure ground-glass pulmonary nodule measuring 8 mm in the left lower lobe.  Recommend follow-up chest CT (IV contrast preferred) in 6 months to document stability. //  CT Abd/ Pelvis (07/2010) -  3 mm RLL and  8 mm LLL nodule stable.  Other nodules unchanged, likely benign.  . 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 Followed by Sports Medicine, Dr. Jennette Kettle.  . Umbilical hernia with obstruction 04/2010    Noted on CT Abd/ pelvis (04/2010). Patient to follow with [facility].    CT abd/pel 04/2010:1.  Small multilobulated paraumbilical hernia of mesenteric fat.   Mild stranding in the fat may indicate that the hernia is mildly   incarcerated.  No free fluid or free air.   Several small lower lobe lung nodules, including one pure   ground-glass pulmonary nodule measuring 8 mm in the left lower   lobe.  Recommend follow-up chest CT     CT pelvis/abd 07/2010:  Slight increase in size of fat containing umbilical hernia with   internal stranding that could indicate fat necrosis but is   otherwise unchanged.   Past Surgical History  Procedure Date  . Incision and drainage abscess anal 07/2008    I&D and debridgement of anorectal abscess  . Inguinal hernia repair   . Tubal ligation     Family History: Family History  Problem Relation Age of Onset  . Diabetes Mother   . Heart attack Neg Hx   . Hypertension Neg Hx     Social History: History   Social History  . Marital Status: Married    Spouse Name: N/A    Number of Children: N/A  . Years of Education: N/A   Occupational History  . Not on file.   Social History Main Topics  . Smoking status: Former Smoker    Types: Cigarettes    Quit date: 09/10/1998  . Smokeless tobacco: Not on file  . Alcohol Use: No  . Drug Use: No  . Sexually Active: Yes -- Female  partner(s)   Other Topics Concern  . Not on file   Social History Narrative  . No narrative on file    Review of Systems: Pertinent items are noted in HPI.  Vital Signs: T: 98.3 P: 80 BP: 145/89 RR: 16 O2 sat: 100%RA   Objective:   Physical Exam  General:  Vital signs reviewed and noted. Well-developed, well-nourished, in no acute distress; alert, appropriate and cooperative throughout examination.   Head:  Normocephalic, atraumatic.   Neck:  No deformities, masses, or tenderness noted.   Lungs:  Normal respiratory effort. Clear to auscultation BL without crackles or wheezes.   Heart:  RRR. S1 and S2  normal without gallop, murmur, or rubs.   Abdomen:  BS normoactive. Soft, Nondistended, no masses or organomegaly. Tenderness to palpation over epigastric region and RUQ. Neg Murphy's sign. No guarding. Scar in umbilicus from prior tubal ligation.   Extremities:  No pretibial edema.       Lab results: CBC:    Component Value Date/Time   WBC 4.8 12/30/2010 1658   HGB 11.3* 12/30/2010 1658   HCT 34.5* 12/30/2010 1658   PLT 352 12/30/2010 1658   MCV 87.8 12/30/2010 1658   NEUTROABS 2.4 12/30/2010 1658   LYMPHSABS 1.9 12/30/2010 1658   MONOABS 0.3 12/30/2010 1658   EOSABS 0.2 12/30/2010 1658   BASOSABS 0.0 12/30/2010 1658      Comprehensive Metabolic Panel:    Component Value Date/Time   NA 139 10/14/2010 1521   K 3.8 10/14/2010 1521   CL 103 10/14/2010 1521   CO2 26 10/14/2010 1521   BUN 13 10/14/2010 1521   CREATININE 0.77 10/14/2010 1521   CREATININE 0.88 07/14/2010 2021   GLUCOSE 85 10/14/2010 1521   CALCIUM 10.2 10/14/2010 1521   AST 15 09/10/2010 1653   ALT 8 09/10/2010 1653   ALKPHOS 99 09/10/2010 1653   BILITOT 0.3 09/10/2010 1653   PROT 6.8 09/10/2010 1653   ALBUMIN 4.2 09/10/2010 1653         Imaging results:  CT abd/pel: pending    Assessment & Plan: 1. Abdominal pain: Patient as a known umbilical hernia with concern for incarceration per CT abd/pelvis in 07/2010, did not  complete recommended surgical follow-up as recommended on multiple occasions secondary to financial constraints. She presented today with worsening of her ongoing pain, with concerning features including increased intensity, duration of pain, decreased frequency of stools, decreased oral intake secondary to pain. I called to see if she could have emergent surgical evaluation at [facility], however, no acute appointments were available for several weeks. Patient denies fevers, chills, exam not consistent with acute abdomen, however, progression of symptoms warrants further workup including evaluation and management of worsening abdominal pain with general surgery consult.  - Admit to regular floor, attending Dr. Blanch Media.  - Will repeat CT Abdomen for evaluation of progression, evidence of strangulation, other abdominal pathologies.  - General surgery consult. -Keep NPO -IVF: 80 cc/hr NS -Pain control with Morphine 2mg  IV q4h prn  -Blood cultures x 2, check lipase, PT/INR, CMET, CBC -Zofran 8mg  IV q6h prn N/V  2. Asthma: stable.  Will continue albuterol inhaler.  3. HTN: adequately controlled.  Slightly elevated 2/2 pain.  Will hold her Lisinopril/hctz for now since she is NPO.  4. Anemia- iron deficiency with Ferritin 14, baseline 11-12.  She is currently at baseline with Hb of 11.3.  Will continue to monitor CBC.   Give consider Venofer dose per pharmacy.   DVT PPX:  Lovenox 40mg  SQ daily    (PGY1): Carrolyn Meiers, [phone] ____________________________________    Date/ Time:                ____________________________________     Lars Mage, M.D. (Senior resident): [phone]____________________________________    Date/ Time:      ____________________________________     I have seen and examined the patient. I reviewed the resident/fellow note and agree with the findings and plan of care as documented. My additions and revisions are included.    Signature:  ____________________________________________     Internal Medicine Teaching Service Attending    Date:    ____________________________________________

## 2010-12-30 NOTE — Progress Notes (Signed)
Subjective:    Patient ID: [name], female    DOB: [birth year], 55 y.o.   MRN: [id number]  HPI Pt is a 55 y.o. female with a PMHx of HTN, HLD, morbid obesity, and known umbilical hernia who presents to the clinic for progressively worsening periumbilical/ RUQ abdominal pain, which has been ongoing since November 2011. Pt was seen in ER 07/2010, diagnosed with likely incarcerated umbilical hernia. Was recommended to follow-up with [facility] [name] [facility] and given an appointment in 06/2010, but did follow-up secondary to financial concerns. Patient was then seen in [facility] in 10/2010, for worsening pain, without evidence of peritonitis or strangulation, was again set up for urgent surgical follow-up. However, pt then decided to wait to be approved with orange card prior to recommended follow-up, and only turned in paperwork allowing for approval 2 weeks ago, therefore, she is still awaiting an appointment for surgical follow-up. When pt was seen in clinic today, she states that the pain has continued to worsen, is now a constant, sharp abdominal pain, rated 9/10 in severity. Pain is aggravated by activity, coughing, and eating. Having a difficult time keeping food down. Mildly improved with direct massage. Now also having less frequent bowel movements, occuring only every 3-4 day from prior daily movements. Has not noted overt blood in her stools. Confirms occasional nausea, but no overt vomiting. Denies fevers, chills, malaise, no dysuria, hematuria. Otherwise, pt remains without chest pain, difficulty breathing, shortness of breath, headaches, vision changes, cough. Confirms left leg pain for which she is followed by sports medicine.     Review of Systems Per HPI.  Current Outpatient Medications Medication Sig  . albuterol (PROVENTIL HFA;VENTOLIN HFA) 108 (90 BASE) MCG/ACT inhaler Inhale 2 puffs into the lungs every 6 (six) hours as needed for wheezing or shortness of breath.  .  chlorpheniramine-HYDROcodone (TUSSIONEX PENNKINETIC ER) 10-8 MG/5ML LQCR Take 5 mLs by mouth every 12 (twelve) hours as needed.  [name] lisinopril-hydrochlorothiazide (PRINZIDE,ZESTORETIC) 20-25 MG per tablet Take 1 tablet by mouth daily.  . cyclobenzaprine (FLEXERIL) 10 MG tablet Take 10 mg by mouth 2 (two) times daily as needed. For pain    Allergies Toradol - palpitations  Past Medical History  Diagnosis Date  . Hypertension   . Hyperlipidemia   . Asthma   . Obesity   . Lower extremity edema     Chronic. 2D echo (2005) - EF 65%.  . Seasonal allergies   . Abnormal EKG 06/2003    History of inverted T waves V1-V3. Normal 2D echo (07/23/2003): LVEF 65%.  . Anemia     BL Hgb 11-12. Ferritin 14 - low normal (08/2007). Colonoscopy 2009 - external hemorrhoids (excellent prep)  . History of multiple pulmonary nodules     Incidental finding: CT Abd/ Pelvis (04/2010) - Several small lower lobe lung nodules, including one pure ground-glass pulmonary nodule measuring 8 mm in the left lower lobe.  Recommend follow-up chest CT (IV contrast preferred) in 6 months to document stability. //  CT Abd/ Pelvis (07/2010) -  3 mm RLL and  8 mm LLL nodule stable.  Other nodules unchanged, likely benign.  . 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 Followed by Sports Medicine, Dr. Jennette Kettle.  . Umbilical hernia with obstruction 04/2010    Noted on CT Abd/ pelvis (04/2010). Patient to follow with [facility].     Past Surgical History  Procedure Date  . Incision and drainage abscess anal 07/2008    I&D and  debridgement of anorectal abscess  . Inguinal hernia repair   . Tubal ligation        Objective:   Physical Exam General: Vital signs reviewed and noted. Well-developed, well-nourished, in no acute distress; alert, appropriate and cooperative throughout examination.  Head: Normocephalic, atraumatic.  Neck: No deformities, masses, or tenderness noted.  Lungs:  Normal respiratory effort. Clear to  auscultation BL without crackles or wheezes.  Heart: RRR. S1 and S2 normal without gallop, murmur, or rubs.  Abdomen:  BS normoactive. Soft, Nondistended, no masses or organomegaly. Tenderness to palpation over epigastric region and RUQ. Positive rebound tenderness (with pain illicited in RUQ). No guarding. Scar in umbilicus from prior tubal ligation.  Extremities: No pretibial edema.         Assessment & Plan:  Case and plan of care discussed with Dr. Ulyess Mort.

## 2010-12-30 NOTE — Patient Instructions (Addendum)
Arthritis instructions: Take tylenol 500mg  1-2 tabs three times a day for pain. Aleve 1-2 tabs twice a day with food Glucosamine sulfate 750mg  twice a day is a supplement that has been shown to help moderate to severe arthritis. Capsaicin topically up to four times a day may also help with pain. Cortisone injections are an option. If you are overweight, try to lose weight through diet. Consider physical therapy to strengthen muscles around the joint that hurts to take pressure off of the joint itself. Walker or cane if needed. Heat or ice 15 minutes at a time 3-4 times a day as needed to help with pain. Water aerobics and cycling with low resistance are the best two types of exercise for arthritis. If a week goes by and you are not improved, call me - we will consider surgical referral and/or MRI to look for a degenerative meniscus tear.

## 2010-12-30 NOTE — Progress Notes (Signed)
Subjective:    Patient ID: [name], female    DOB: 12/10/1955, 55 y.o.   MRN: [id number]  PCP: Saralyn Pilar  HPI 55 yo F here for left knee pain.  Patient reports > 2 years of left knee pain - believes it started about when she had fallen down directly onto her left knee. Since then has had diffuse pain within left knee, swelling, some bruising. Had x-rays showing advanced DJD in all compartments back in 2009. She reports having had an MRI remotely but we have no report of this. She has tried different OTC meds, vicodin, cortisone shot (1 1/2 years ago - only 3 days of relief with this). Did home exercises as well though pain limited how much she could do. She reports that it catches, locks up, gives out but not in one specific location. Applied for medicaid but was denied - was going to try to get in with a surgeon to discuss possible knee replacement.  Past Medical History  Diagnosis Date  . Hypertension   . Hyperlipidemia   . Asthma   . Obesity   . Lower extremity edema     Chronic. 2D echo (2005) - EF 65%.  . Seasonal allergies   . Abnormal EKG 06/2003    History of inverted T waves V1-V3. Normal 2D echo (07/23/2003): LVEF 65%.  . Anemia     BL Hgb 11-12. Ferritin 14 - low normal (08/2007). Colonoscopy 2009 - external hemorrhoids (excellent prep)  . History of multiple pulmonary nodules     Incidental finding: CT Abd/ Pelvis (04/2010) - Several small lower lobe lung nodules, including one pure ground-glass pulmonary nodule measuring 8 mm in the left lower lobe.  Recommend follow-up chest CT (IV contrast preferred) in 6 months to document stability. //  CT Abd/ Pelvis (07/2010) -  3 mm RLL and  8 mm LLL nodule stable.  Other nodules unchanged, likely benign.  . 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 Followed by Sports Medicine, Dr. Jennette Kettle.  . Umbilical hernia with obstruction 04/2010    Noted on CT Abd/ pelvis (04/2010). Patient to follow with [facility].      Current Outpatient Prescriptions on File Prior to Visit  Medication Sig Dispense Refill  . albuterol (PROVENTIL HFA;VENTOLIN HFA) 108 (90 BASE) MCG/ACT inhaler Inhale 2 puffs into the lungs every 6 (six) hours as needed for wheezing or shortness of breath.  1 Inhaler  5  . azithromycin (ZITHROMAX) 250 MG tablet Take 2 tablets by mouth on day 1, followed by 1 tablet by mouth daily for 4 days. Last dose 12/16/10       . chlorpheniramine-HYDROcodone (TUSSIONEX PENNKINETIC ER) 10-8 MG/5ML LQCR Take 5 mLs by mouth every 12 (twelve) hours as needed.  50 mL  0  . cyclobenzaprine (FLEXERIL) 10 MG tablet Take 10 mg by mouth 2 (two) times daily as needed. For pain       . lisinopril-hydrochlorothiazide (PRINZIDE,ZESTORETIC) 20-25 MG per tablet Take 1 tablet by mouth daily.  30 tablet  4  . predniSONE (DELTASONE) 10 MG tablet Take 10 mg by mouth daily. Per taper, start 60mg  and decreased 10mg  until done.  Last day 12/17/10       . selenium sulfide (SELSUN) 1 % LOTN Apply lotion to affected areas, lather with small amounts of water, leave on skin for 10 minutes, then rinse thoroughly. Apply every day for 7 days; rub foam into affected skin areas twice daily. Do not  use on broken or inflamed skin. May damage jewelry, remove before treatment.  118 mL  1    Past Surgical History  Procedure Date  . Incision and drainage abscess anal 07/2008    I&D and debridgement of anorectal abscess  . Hernia repair     Allergies  Allergen Reactions  . Toradol Palpitations    History   Social History  . Marital Status: Married    Spouse Name: N/A    Number of Children: N/A  . Years of Education: N/A   Occupational History  . Not on file.   Social History Main Topics  . Smoking status: Former Smoker    Types: Cigarettes    Quit date: 09/10/1998  . Smokeless tobacco: Not on file  . Alcohol Use: No  . Drug Use: No  . Sexually Active: Yes -- Female partner(s)   Other Topics Concern  . Not on file    Social History Narrative  . No narrative on file    Family History  Problem Relation Age of Onset  . Diabetes Mother   . Heart attack Neg Hx   . Hypertension Neg Hx     BP 149/85  Pulse 71  Temp(Src) 98.2 F (36.8 C) (Oral)  Ht 5\' 4"  (1.626 m)  Wt 326 lb (147.873 kg)  BMI 55.96 kg/m2  LMP 09/09/2009  Review of Systems See HPI above.    Objective:   Physical Exam Gen: NAD, obese L knee: No gross deformity, effusion, warmth compared to right knee. Diffuse TTP anteriorly including joint lines, pes bursa, posterior patellar facets.  Mild pain posteriorly also. ROM 0 - 100 degrees with pain on full flexion through anterior knee. Stable to valgus and varus stress.  Negative ant/post drawers.  Negative lachmanns Mcmurrays causes pain throughout anterior knee but no palpable click. Negative apprehension (pain but no laxity).  + clarkes.     Assessment & Plan:  1. Left knee pain - repeat x-rays reviewed.  In some areas does have advanced DJD of knee though not bone-on-bone.  Still moderate-severe.  This is most likely the cause of her pain though she does endorse mechanical symptoms.  Continue icing, discussed other oral and topical medications she can try.  Went ahead with repeat cortisone injection today.  If not helping with this, advised her would consider trying to get her in with an orthopedic surgeon for TKR discussion and/or MRI (may defer this to them given pain is most likely due to her arthritis, would only consider MRI if they would try arthroscopy/debridement first).  Will call me in 1 week if she is not improving.  After informed written consent, patient was lying supine on exam table. Left knee was prepped with alcohol swab and utilizing superolateral approach, patient's left knee was injected intraarticularly with 3:1 marcaine: depomedrol. Patient tolerated the procedure well without immediate complications.

## 2010-12-30 NOTE — Assessment & Plan Note (Signed)
Patient as a known umbilical hernia with concern for incarceration per CT abd/pelvis in 07/2010, did not complete recommended surgical follow-up as recommended on multiple occasions secondary to financial constraints. She presented today with worsening of her ongoing pain, with concerning features including increased intensity, duration of pain, decreased frequency of stools, decreased oral intake secondary to pain. I called to see if she could have emergent surgical evaluation at [facility], however, no acute appointments were available for several weeks. Patient denies fevers, chills, exam not consistent with acute abdomen, however, progression of symptoms warrants further workup. Therefore, after discussion with my attending, Dr. Aundria Rud, who confirmed history and exam findings, we recommend inpatient admission for further evaluation and management of worsening abdominal pain with general surgery consult. - Admit to regular floor, attending Dr. Blanch Media. - Likely repeat CT Abdomen for evaluation of progression, evidence of strangulation, other abdominal pathologies.  -  General surgery consult.

## 2010-12-31 DIAGNOSIS — R109 Unspecified abdominal pain: Secondary | ICD-10-CM

## 2010-12-31 LAB — BASIC METABOLIC PANEL
BUN: 9 mg/dL (ref 6–23)
CO2: 24 mEq/L (ref 19–32)
Calcium: 9.5 mg/dL (ref 8.4–10.5)
Chloride: 104 mEq/L (ref 96–112)
Creatinine, Ser: 0.67 mg/dL (ref 0.50–1.10)
GFR calc Af Amer: >60 mL/min (ref >60)
GFR calc non Af Amer: >60 mL/min (ref >60)
Glucose, Bld: 88 mg/dL (ref 70–99)
Potassium: 3.7 mEq/L (ref 3.5–5.1)
Sodium: 138 mEq/L (ref 135–145)

## 2010-12-31 LAB — SURGICAL PCR SCREEN
MRSA, PCR: NEGATIVE
Staphylococcus aureus: NEGATIVE

## 2010-12-31 LAB — CBC
HCT: 35.9 % — ABNORMAL LOW (ref 36.0–46.0)
Hemoglobin: 11.4 g/dL — ABNORMAL LOW (ref 12.0–15.0)
MCH: 28.2 pg (ref 26.0–34.0)
MCHC: 31.8 g/dL (ref 30.0–36.0)
MCV: 88.9 fL (ref 78.0–100.0)
Platelets: 322 10*3/uL (ref 150–400)
RBC: 4.04 MIL/uL (ref 3.87–5.11)
RDW: 14.5 % (ref 11.5–15.5)
WBC: 3.5 10*3/uL — ABNORMAL LOW (ref 4.0–10.5)

## 2010-12-31 NOTE — Progress Notes (Signed)
Taken to Room 3013 via w/c after report given by phone. 5:30PM 12/30/10. Stanton Kidney Matheo Rathbone RN

## 2011-01-02 LAB — BASIC METABOLIC PANEL
BUN: 9 mg/dL (ref 6–23)
CO2: 31 mEq/L (ref 19–32)
Calcium: 9.3 mg/dL (ref 8.4–10.5)
Chloride: 100 mEq/L (ref 96–112)
Creatinine, Ser: 0.7 mg/dL (ref 0.50–1.10)
GFR calc Af Amer: >60 mL/min (ref >60)
GFR calc non Af Amer: >60 mL/min (ref >60)
Glucose, Bld: 115 mg/dL — ABNORMAL HIGH (ref 70–99)
Potassium: 3.4 mEq/L — ABNORMAL LOW (ref 3.5–5.1)
Sodium: 138 mEq/L (ref 135–145)

## 2011-01-03 ENCOUNTER — Encounter: Payer: Self-pay | Admitting: Internal Medicine

## 2011-01-03 DIAGNOSIS — R109 Unspecified abdominal pain: Secondary | ICD-10-CM

## 2011-01-03 LAB — IRON AND TIBC
Iron: 24 ug/dL — ABNORMAL LOW (ref 42–135)
Saturation Ratios: 9 % — ABNORMAL LOW (ref 20–55)
TIBC: 269 ug/dL (ref 250–470)
UIBC: 245 ug/dL

## 2011-01-03 LAB — BASIC METABOLIC PANEL
BUN: 10 mg/dL (ref 6–23)
CO2: 31 mEq/L (ref 19–32)
Calcium: 9.7 mg/dL (ref 8.4–10.5)
Chloride: 101 mEq/L (ref 96–112)
Creatinine, Ser: 0.65 mg/dL (ref 0.50–1.10)
GFR calc Af Amer: >60 mL/min (ref >60)
GFR calc non Af Amer: >60 mL/min (ref >60)
Glucose, Bld: 102 mg/dL — ABNORMAL HIGH (ref 70–99)
Potassium: 4.6 mEq/L (ref 3.5–5.1)
Sodium: 138 mEq/L (ref 135–145)

## 2011-01-03 LAB — DIFFERENTIAL
Basophils Absolute: 0.1 10*3/uL (ref 0.0–0.1)
Basophils Relative: 1 % (ref 0–1)
Eosinophils Absolute: 0.3 10*3/uL (ref 0.0–0.7)
Eosinophils Relative: 6 % — ABNORMAL HIGH (ref 0–5)
Lymphocytes Relative: 37 % (ref 12–46)
Lymphs Abs: 2 10*3/uL (ref 0.7–4.0)
Monocytes Absolute: 0.5 10*3/uL (ref 0.1–1.0)
Monocytes Relative: 9 % (ref 3–12)
Neutro Abs: 2.5 10*3/uL (ref 1.7–7.7)
Neutrophils Relative %: 47 % (ref 43–77)

## 2011-01-03 LAB — CBC
HCT: 34.5 % — ABNORMAL LOW (ref 36.0–46.0)
Hemoglobin: 11.5 g/dL — ABNORMAL LOW (ref 12.0–15.0)
MCH: 28.9 pg (ref 26.0–34.0)
MCHC: 33.3 g/dL (ref 30.0–36.0)
MCV: 86.7 fL (ref 78.0–100.0)
Platelets: ADEQUATE 10*3/uL (ref 150–400)
RBC: 3.98 MIL/uL (ref 3.87–5.11)
RDW: 14.4 % (ref 11.5–15.5)
WBC: 5.4 10*3/uL (ref 4.0–10.5)

## 2011-01-03 LAB — VITAMIN B12: Vitamin B-12: 316 pg/mL (ref 211–911)

## 2011-01-03 LAB — FERRITIN: Ferritin: 73 ng/mL (ref 10–291)

## 2011-01-03 LAB — FOLATE: Folate: 11.9 ng/mL

## 2011-01-03 MED ORDER — HYDROCODONE-ACETAMINOPHEN 5-500 MG PO TABS: 1.0000 | ORAL_TABLET | ORAL | Status: DC | PRN

## 2011-01-03 MED ORDER — POLYETHYLENE GLYCOL 3350 17 G PO PACK: 17.0000 g | PACK | Freq: Every day | ORAL | Status: AC

## 2011-01-03 MED ORDER — DOCUSATE SODIUM 100 MG PO CAPS: 100.0000 mg | ORAL_CAPSULE | Freq: Two times a day (BID) | ORAL | Status: DC

## 2011-01-03 NOTE — Consult Note (Signed)
[name], [name]                ACCOUNT NO.:  [id number]  MEDICAL RECORD NO.:  [id number]  LOCATION:  3013                         FACILITY:  [facility]  PHYSICIAN:  Gabrielle Dare. Janee Morn, M.D.DATE OF BIRTH:  09/30/1955  DATE OF CONSULTATION: DATE OF DISCHARGE:                                CONSULTATION   CHIEF COMPLAINT:  Abdominal pain with a history of umbilical hernia.  HISTORY OF PRESENT ILLNESS:  [name] is a 55 year old African American female with a known umbilical hernia since at least October of 2011. She had evaluated previously including CT scan in October. She had followup CT scan done in January.  She had failed to followup for general surgery referrals reportedly due to financial constraints.  [facility] with increasing pain at the hernia site and decreased frequency of bowel movements.  She has been eating normally up until today, but today had some nausea.  She was admitted by the Internal Medicine Teaching Service for further evaluation.  PAST MEDICAL HISTORY:  Morbid obesity, hypertension, asthma, and hyperlipidemia.  PAST SURGICAL HISTORY:  Tubal ligation, right inguinal hernia repair, and incision and drainage of perianal abscess.  SOCIAL HISTORY:  She quit smoking in 2000.  She does not drink alcohol. She does not use drugs.  ALLERGIES:  TORADOL.  Medications at home include albuterol, chlorpheniramine, Flexeril, lisinopril/hydrochlorothiazide, prednisone taper was done recently, but she is not currently on that.  REVIEW OF SYSTEMS:  GI complaints as above.  Of note, she has had no fevers or chills at home and aside from the GI complaints as above, review of systems is unremarkable.  PHYSICAL EXAMINATION:  VITAL SIGNS:  Temperature 98.3, blood pressure 145/89, heart rate 80, respirations 16, saturations 100% on room air. GENERAL:  She is awake and alert.  She appears stated age. NECK:  Supple with no tenderness  or masses. LUNGS:  Clear to auscultation.  There is no recurrent wheezing. CARDIAC:  Regular S1 and S2.  No murmurs heard.  Impulse is vaguely palpable on the left chest. ABDOMEN:  Soft.  She does have an umbilical hernia extending over to the right side, but we have reduced this on palpation at least partially. It does tend to spontaneously recur in a short period of time.  There is mild tenderness on reduction.  There is no overlying skin changes. Bowel sounds are present.  There is also mild tenderness to palpation in the right abdomen, but there is no guarding or generalized peritoneal signs. SKIN:  Warm and dry.  White blood cell count 4.8.  Liver function tests within normal limits.  IMPRESSION AND PLAN:  Umbilical hernia with increasing symptoms.  The patient's previous CAT scans have shown the hernia to contain fat only with some stranding dating back to October consistent with some partial incarceration at that time.  The patient's primary service has ordered a CT scan of the abdomen and pelvis for tonight.  We will review that and make further plans accordingly.  This was discussed in detail with the patient and husband.     Gabrielle Dare Janee Morn, M.D.     BET/MEDQ  D:  12/30/2010  T:  12/31/2010  Job:  [id number]  Electronically Signed by Violeta Gelinas M.D. on 01/03/2011 12:29:39 PM

## 2011-01-03 NOTE — Progress Notes (Signed)
Patient was admitted on 12/30/10 for incacerated ventral hernia which was repaired by Dr. Gerrit Friends.  Patient still has mild abdominal pain but no erythema, or drainage at the surgical site.  We held her Lisinopril/HCTZ during hospital course because her BP was on soft side.  Patient also has constipation x 5 days and was treated with Senokot and Miralax, and refused fleet enema.  Of note, patient is on narcotics which could contribute to her constipation.  Will send home with colace and miralax for constipation and Hydrocodone 5/325mg  q4h prn pain, prescription given for #30.  She will follow up with [facility] in 2-3 weeks and with Dr. Saralyn Pilar on 01/20/11 @ 4:15PM.

## 2011-01-04 ENCOUNTER — Telehealth: Payer: Self-pay | Admitting: *Deleted

## 2011-01-04 ENCOUNTER — Telehealth (INDEPENDENT_AMBULATORY_CARE_PROVIDER_SITE_OTHER): Payer: Self-pay | Admitting: General Surgery

## 2011-01-04 ENCOUNTER — Other Ambulatory Visit (INDEPENDENT_AMBULATORY_CARE_PROVIDER_SITE_OTHER): Payer: Self-pay | Admitting: General Surgery

## 2011-01-04 NOTE — Telephone Encounter (Signed)
Pt called stating she left hospital on 6/25 and wanted to know where her bedside commode was.  [facility] was to deliver it to home.  I called [facility]  And they will call pt about equipment

## 2011-01-06 LAB — CULTURE, BLOOD (ROUTINE X 2)
Culture  Setup Time: 201206220043
Culture  Setup Time: 201206220043
Culture: NO GROWTH
Culture: NO GROWTH

## 2011-01-10 ENCOUNTER — Encounter (INDEPENDENT_AMBULATORY_CARE_PROVIDER_SITE_OTHER): Payer: Self-pay | Admitting: Surgery

## 2011-01-10 ENCOUNTER — Ambulatory Visit (INDEPENDENT_AMBULATORY_CARE_PROVIDER_SITE_OTHER): Payer: PRIVATE HEALTH INSURANCE | Admitting: Surgery

## 2011-01-10 VITALS — BP 154/81 | HR 76 | Temp 98.0°F | Ht 64.0 in | Wt 353.0 lb

## 2011-01-10 DIAGNOSIS — K420 Umbilical hernia with obstruction, without gangrene: Secondary | ICD-10-CM

## 2011-01-10 NOTE — Progress Notes (Signed)
HISTORY: Patient underwent repair of incarcerated ventral incisional hernia on June 22 at [facility].   PERTINENT REVIEW OF SYSTEMS: The patient noticed drainage and surgical wound and discomfort.   EXAM: There is a small amount of drainage at the site of the incision area and Steri-Strips are removed. There were a few small blisters from the tape. These are debrided. Wound was dressed with Neosporin and dry gauze after cleansing with hydrogen peroxide.   IMPRESSION: Blistering from tape. Small drainage from surgical wound.   PLAN: Patient will dress the wound 3 times daily with antibiotic ointment topically. She will shower once daily. She will return in one week for wound check. She will monitor for any further drainage or sign of infection.

## 2011-01-16 ENCOUNTER — Encounter: Payer: Self-pay | Admitting: Internal Medicine

## 2011-01-18 NOTE — Op Note (Signed)
  NAMEHOUA, NIE NO.:  [id number]  MEDICAL RECORD NO.:  [id number]  LOCATION:  3013                         FACILITY:  [facility]  PHYSICIAN:  Velora Heckler, MD      DATE OF BIRTH:  [birth year]  DATE OF PROCEDURE:  12/31/2010                               OPERATIVE REPORT   PREOPERATIVE DIAGNOSIS:  Incarcerated ventral incisional hernia.  POSTOPERATIVE DIAGNOSIS:  Incarcerated ventral incisional hernia.  PROCEDURE:  Repair of incarcerated ventral incisional hernia with Ethicon mesh patch.  SURGEON:  Velora Heckler, MD, FACS  ANESTHESIA:  General per Dr. Judie Petit.  ESTIMATED BLOOD LOSS:  Minimal.  PREPARATION:  ChloraPrep.  COMPLICATIONS:  None.  INDICATIONS:  The patient is a 55 year old black female with a longstanding history of incarcerated ventral incisional hernia.  She had undergone previous bilateral tubal ligation through an umbilical incision.  Hernia has become progressively symptomatic.  The patient was admitted to [facility] for abdominal pain by the medical service.  CT scan showed incarcerated fat within the ventral incisional hernia.  The patient is now prepared and brought to the operating room for repair.  BODY OF REPORT:  Procedure was done in OR #17 at [facility].  After induction of general anesthesia, the patient was prepped and draped in the usual strict aseptic fashion.  After ascertaining that an adequate level of anesthesia had been achieved, a transverse abdominal incision was made just below the level of the umbilicus.  Dissection was carried through subcutaneous tissues down to the plane of the anterior abdominal fascia.  Fascial plane was developed.  Umbilicus was completely mobilized off the anterior abdominal wall.  There was a small umbilical hernia as well as an incarcerated incisional hernia just below the level of the umbilicus. Hernia sac was opened.  It contained incarcerated omentum  which was freed from the sac and reduced back within the peritoneal cavity. Fascial defects were connected.  Hernia sac was excised and discarded. Fascial defect measured approximately 1.5 cm in greatest diameter.  An Ethicon ventral patch mesh measuring 4.3 cm in diameter was selected. It was prepared and inserted into the preperitoneal space and deployed. It was secured circumferentially with interrupted 0 Ethibond simple sutures.  Local field block was placed with Marcaine.  Umbilicus was re- affixed to the abdominal wall with an interrupted 0 Ethibond suture. Subcutaneous tissues were closed with interrupted 3-0 Vicryl sutures. Skin was anesthetized with local anesthetic.  Skin edges were reapproximated with a running 4-0 Monocryl subcuticular suture.  Wound was washed and dried and Benzoin and Steri-Strips were applied.  Sterile dressings were applied.  The patient was awakened from anesthesia and brought to the recovery room. The patient tolerated the procedure well.   Velora Heckler, MD, FACS     TMG/MEDQ  D:  12/31/2010  T:  01/01/2011  Job:  [id number]  Electronically Signed by Darnell Level MD on 01/18/2011 10:59:01 AM

## 2011-01-19 ENCOUNTER — Ambulatory Visit (INDEPENDENT_AMBULATORY_CARE_PROVIDER_SITE_OTHER): Payer: PRIVATE HEALTH INSURANCE | Admitting: Surgery

## 2011-01-19 ENCOUNTER — Encounter (INDEPENDENT_AMBULATORY_CARE_PROVIDER_SITE_OTHER): Payer: Self-pay | Admitting: Surgery

## 2011-01-19 ENCOUNTER — Ambulatory Visit (INDEPENDENT_AMBULATORY_CARE_PROVIDER_SITE_OTHER): Payer: Self-pay | Admitting: Family Medicine

## 2011-01-19 VITALS — BP 144/88 | HR 80 | Temp 98.0°F | Ht 63.0 in | Wt 326.0 lb

## 2011-01-19 VITALS — HR 80 | Temp 96.3°F

## 2011-01-19 DIAGNOSIS — M25562 Pain in left knee: Secondary | ICD-10-CM

## 2011-01-19 DIAGNOSIS — K430 Incisional hernia with obstruction, without gangrene: Secondary | ICD-10-CM

## 2011-01-19 DIAGNOSIS — M25569 Pain in unspecified knee: Secondary | ICD-10-CM

## 2011-01-19 NOTE — Progress Notes (Signed)
HISTORY: Patient is a 55 year old black female who returns for postoperative wound check having undergone repair of incarcerated ventral incisional hernia.   PERTINENT REVIEW OF SYSTEMS: The patient has noted no further drainage since her last office visit. She is having slight burning at the lateral edge of the incision.   EXAM: Surgical wound is healing without further complication. There may be a small seroma beneath the wound. There is no drainage. There is no erythema. With Valsalva there is no evidence of recurrent hernia.   IMPRESSION: Status post repair of incarcerated ventral incisional hernia with mesh   PLAN: The patient will begin applying topical gallbladder with vitamin E cream to the incision. She will return to see me in 6 weeks for a final wound check.

## 2011-01-20 ENCOUNTER — Encounter: Payer: Self-pay | Admitting: Internal Medicine

## 2011-01-20 ENCOUNTER — Ambulatory Visit (INDEPENDENT_AMBULATORY_CARE_PROVIDER_SITE_OTHER): Payer: Self-pay | Admitting: Internal Medicine

## 2011-01-20 ENCOUNTER — Encounter: Payer: Self-pay | Admitting: Family Medicine

## 2011-01-20 VITALS — BP 147/91 | HR 81 | Temp 97.7°F | Ht 63.0 in | Wt 331.4 lb

## 2011-01-20 DIAGNOSIS — K430 Incisional hernia with obstruction, without gangrene: Secondary | ICD-10-CM

## 2011-01-20 DIAGNOSIS — M25562 Pain in left knee: Secondary | ICD-10-CM | POA: Insufficient documentation

## 2011-01-20 DIAGNOSIS — I10 Essential (primary) hypertension: Secondary | ICD-10-CM

## 2011-01-20 DIAGNOSIS — D649 Anemia, unspecified: Secondary | ICD-10-CM

## 2011-01-20 MED ORDER — CYCLOBENZAPRINE HCL 10 MG PO TABS: 10.0000 mg | ORAL_TABLET | Freq: Two times a day (BID) | ORAL | Status: DC | PRN

## 2011-01-20 NOTE — Patient Instructions (Signed)
   Please follow-up at the clinic in 1 month for clinic visit, at which time we will reevaluate your blood pressure with consideration of escalation of your treatments.  Please come to clinic tomorrow for lab visit, I have already ordered the lab and will call you if results require you to start iron supplements..  Please follow-up with Dr. Gerrit Friends at your next scheduled visit.  If symptoms worsen, or new symptoms arise, please call the clinic or go to the ER.  Please bring all of your medications in a bag to your next visit.

## 2011-01-20 NOTE — Progress Notes (Signed)
Subjective:    Patient ID: [name], female    DOB: 11-07-1955, 55 y.o.   MRN: [id number]  PCP: [name]  HPI  55 yo F here for 3 week f/u left knee pain.  6/21: Patient reports > 2 years of left knee pain - believes it started about when she had fallen down directly onto her left knee. Since then has had diffuse pain within left knee, swelling, some bruising. Had x-rays showing advanced DJD in all compartments back in 2009. She reports having had an MRI remotely but we have no report of this. She has tried different OTC meds, vicodin, cortisone shot (1 1/2 years ago - only 3 days of relief with this). Did home exercises as well though pain limited how much she could do. She reports that it catches, locks up, gives out but not in one specific location. Applied for medicaid but was denied - was going to try to get in with a surgeon to discuss possible knee replacement.  Today: Patient reports pain still a 7/10 despite cortisone injection 3 weeks ago - only helped for a few days and did not last. Taking narcotic pain medication. Pain starting to radiate into calf and up to hip. Massage mild help. Difficult to bear weight on left leg due to knee pain.  Past Medical History  Diagnosis Date  . Hypertension   . Hyperlipidemia   . Asthma   . Obesity   . Lower extremity edema     Chronic. 2D echo (2005) - EF 65%.  . Seasonal allergies   . Abnormal EKG 06/2003    History of inverted T waves V1-V3. Normal 2D echo (07/23/2003): LVEF 65%.  . Anemia     BL Hgb 11-12. Ferritin 14 - low normal (08/2007). Colonoscopy 2009 - external hemorrhoids (excellent prep)  . History of multiple pulmonary nodules     Incidental finding: CT Abd/ Pelvis (04/2010) - Several small lower lobe lung nodules, including one pure ground-glass pulmonary nodule measuring 8 mm in the left lower lobe.  Recommend follow-up chest CT (IV contrast preferred) in 6 months to document stability. //  CT Abd/  Pelvis (07/2010) -  3 mm RLL and  8 mm LLL nodule stable.  Other nodules unchanged, likely benign.  . 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 Followed by Sports Medicine, Dr. Jennette Kettle.  . Umbilical hernia with obstruction 04/2010    Noted on CT Abd/ pelvis (04/2010). Patient to follow with [facility].     Current Outpatient Prescriptions on File Prior to Visit  Medication Sig Dispense Refill  . albuterol (PROVENTIL HFA;VENTOLIN HFA) 108 (90 BASE) MCG/ACT inhaler Inhale 2 puffs into the lungs every 6 (six) hours as needed for wheezing or shortness of breath.  1 Inhaler  5  . chlorpheniramine-HYDROcodone (TUSSIONEX PENNKINETIC ER) 10-8 MG/5ML LQCR Take 5 mLs by mouth every 12 (twelve) hours as needed.  50 mL  0  . docusate sodium (COLACE) 100 MG capsule Take 1 capsule (100 mg total) by mouth 2 (two) times daily.  60 capsule  3  . HYDROcodone-acetaminophen (VICODIN) 5-500 MG per tablet Take 1 tablet by mouth every 4 (four) hours as needed for pain.  30 tablet  0    Past Surgical History  Procedure Date  . Incision and drainage abscess anal 07/2008    I&D and debridgement of anorectal abscess  . Inguinal hernia repair   . Tubal ligation     Allergies  Allergen  Reactions  . Toradol Palpitations    History   Social History  . Marital Status: Married    Spouse Name: N/A    Number of Children: N/A  . Years of Education: N/A   Occupational History  . Not on file.   Social History Main Topics  . Smoking status: Former Smoker    Types: Cigarettes    Quit date: 09/10/1998  . Smokeless tobacco: Not on file  . Alcohol Use: No  . Drug Use: No  . Sexually Active: Yes -- Female partner(s)   Other Topics Concern  . Not on file   Social History Narrative  . No narrative on file    Family History  Problem Relation Age of Onset  . Diabetes Mother   . Heart attack Neg Hx   . Hypertension Neg Hx     BP 144/88  Pulse 80  Temp(Src) 98 F (36.7 C) (Oral)  Ht 5\' 3"  (1.6  m)  Wt 326 lb (147.873 kg)  BMI 57.75 kg/m2  LMP 09/09/2009  Review of Systems  See HPI above.    Objective:   Physical Exam  Gen: NAD, obese L knee: No gross deformity, effusion, warmth compared to right knee. Diffuse TTP anteriorly including joint lines, pes bursa, posterior patellar facets.  Mild pain posteriorly also. ROM 0 - 100 degrees with pain on full flexion through anterior knee. Stable to valgus and varus stress.  Negative ant/post drawers.  Negative lachmanns Mcmurrays causes pain throughout anterior knee. Negative apprehension. + clarkes.     Assessment & Plan:  1. Left knee pain - In some areas does have advanced DJD of knee though not bone-on-bone.  Still moderate-severe.  This is most likely the cause of her pain though she does endorse mechanical symptoms.  Patient did not improve with cortisone injection and these have been of decreased benefit.  Will proceed with MRI of knee to assess for meniscal tear on top of her DJD.  We discussed she would need money up front to see an orthopedic surgeon or to set up a payment plan with them if pursuing surgical intervention which is the likely next step.

## 2011-01-20 NOTE — Progress Notes (Addendum)
Subjective:    Patient ID: [name], female    DOB: 1955-10-26, 55 y.o.   MRN: [id number]  HPI  Pt is a 55yo female with PMHx of HLD, iron-deficiency anemia, DJD, and recent hospitalization for incarcerated ventral incisional hernia who presents to clinic today for the following:  1) HFU - Patient was hospitalized at [facility] from 06/21-06/25/2012 for evaluation of abdominal pain and constipation, which was determined to be secondary to known incarcerated ventral hernia which is now status-post repair with Ethicon mesh patch by Dr. Gerrit Friends on 12/31/2010.  Since hospital discharge, patient notes significant improvement of her constipation, abdominal pain is improving, although not yet resolved. She has had recent follow-up with Dr. Gerrit Friends, with indication that surgical site was well healing, without complications. Initially the patient did have some skin maceration secondary to medical tape, however, since discontinuing the tape and continuing with cocoa butter on the affected area, she is having more comfort. Denies fevers, chills, nausea, vomiting, diarrhea, severe abdominal pain. Denies surgical site dehiscence, discharge, surrounding skin area edema or swelling. Is not regularly requiring Vicodin at this point. Final blood cultures which were pending at the time of hospital discharge and returned with no growth x2.  2) Hypertension - she has mildly elevated blood pressures today, however repeat manual recheck indicates a blood pressure of 142/85. Patient does not check blood pressure regularly at home. Currently taking Lisinopril-HCTZ, which was actually supposed to be held at the time of hospital discharge secondary to soft blood pressures during hospital course. However, within the past week the patient has herself resumed her home blood pressure medication. Denies headaches, dizziness, lightheadedness, chest pain, shortness of breath.   3) Anemia -patient has a history of anemia with prior ferritin  levels in 2009 as low as 14. She had recheck anemia panel during her hospital course which indicated an iron of 24, TIBC 269, ferritin 73. Patient denies lightheadedness, dizziness, hematuria, blood in stools.  Review of Systems Per HPI.  Current Outpatient Medications Medication Sig  . albuterol (PROVENTIL HFA;VENTOLIN HFA) 108 (90 BASE) MCG/ACT inhaler Inhale 2 puffs into the lungs every 6 (six) hours as needed for wheezing or shortness of breath.  . cyclobenzaprine (FLEXERIL) 10 MG tablet Take 1 tablet (10 mg total) by mouth 2 (two) times daily as needed. For pain  . docusate sodium (COLACE) 100 MG capsule Take 1 capsule (100 mg total) by mouth 2 (two) times daily.  [name] HYDROcodone-acetaminophen (VICODIN) 5-500 MG per tablet Take 1 tablet by mouth every 4 (four) hours as needed for pain.  [name] lisinopril-hydrochlorothiazide (PRINZIDE,ZESTORETIC) 20-25 MG per tablet Take 1 tablet by mouth daily.      Allergies Toradol  Past Medical History  Diagnosis Date  . Hypertension   . Hyperlipidemia   . Asthma   . Obesity   . Lower extremity edema     Chronic. 2D echo (2005) - EF 65%.  . Seasonal allergies   . Abnormal EKG 06/2003    History of inverted T waves V1-V3. Normal 2D echo (07/23/2003): LVEF 65%.  . Anemia     BL Hgb 11-12. Ferritin 14 - low normal (08/2007). Colonoscopy 2009 - external hemorrhoids (excellent prep)  . History of multiple pulmonary nodules     Incidental finding: CT Abd/ Pelvis (04/2010) - Several small lower lobe lung nodules, including one pure ground-glass pulmonary nodule measuring 8 mm in the left lower lobe.  Recommend follow-up chest CT (IV contrast preferred) in 6 months to document stability. //  CT Abd/ Pelvis (07/2010) -  3 mm RLL and  8 mm LLL nodule stable.  Other nodules unchanged, likely benign.  . 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 Followed by Sports Medicine, Dr. Jennette Kettle.  . Incarcerated ventral hernia 04/2010    Noted on CT Abd/  pelvis (04/2010). Patient now s/p ventral hernia repair by Dr. Gerrit Friends (12/2010)    Past Surgical History  Procedure Date  . Incision and drainage abscess anal 07/2008    I&D and debridgement of anorectal abscess  . Inguinal hernia repair   . Tubal ligation   . Incisional hernia repair 12/2010    Repair of incarcerated ventral incisional hernia with Ethicon mesh patch - performed by Dr. Gerrit Friends.        Objective:   Physical Exam   Filed Vitals:   01/20/11 1634  BP: 147/91  Pulse: 81  Temp: 97.7 F (36.5 C)      General: Vital signs reviewed and noted. Well-developed, well-nourished, in no acute distress; alert, appropriate and cooperative throughout examination.  Head: Normocephalic, atraumatic.  Lungs:  Normal respiratory effort. Clear to auscultation BL without crackles or wheezes.  Heart: RRR. S1 and S2 normal without gallop, murmur, or rubs.  Abdomen:  BS normoactive. Soft, Nondistended, non-tender.  No masses or organomegaly.  Extremities: No pretibial edema.          Assessment & Plan:

## 2011-01-20 NOTE — Assessment & Plan Note (Signed)
No acute issues, no fevers, chills, nausea, vomiting, diarrhea. We will followup with Dr. Gerrit Friends in approximately 5 weeks.  - Recommended Tylenol or Ibuprofen as needed for pain.

## 2011-01-20 NOTE — Assessment & Plan Note (Signed)
Anemia panel is not terribly concerning for iron deficiency anemia as ferritin is currently within normal limits, albeit lower normal limits. She does however have a past history of low normal ferritin in 2009.  - Will recheck CBC, patient will return for lab visit tomorrow as lab is currently closed. - Likely will not require in supplementation, however if indicated will start.

## 2011-01-20 NOTE — Assessment & Plan Note (Signed)
In some areas does have advanced DJD of knee though not bone-on-bone.  Still moderate-severe.  This is most likely the cause of her pain though she does endorse mechanical symptoms.  Patient did not improve with cortisone injection and these have been of decreased benefit.  Will proceed with MRI of knee to assess for meniscal tear on top of her DJD.  We discussed she would need money up front to see an orthopedic surgeon or to set up a payment plan with them if pursuing surgical intervention which is the likely next step.

## 2011-01-20 NOTE — Assessment & Plan Note (Signed)
Blood pressure is just mildly elevated with repeat check. However, patient has just resumed her home blood pressure medications within the past week. - Will recheck blood pressure during next clinic visit, with consideration of escalation of therapy if blood pressures continue to remain mildly elevated. Patient is open to either increasing lisinopril versus adding amlodipine as indicated on followup.

## 2011-01-21 ENCOUNTER — Other Ambulatory Visit: Payer: Self-pay

## 2011-01-21 DIAGNOSIS — D649 Anemia, unspecified: Secondary | ICD-10-CM

## 2011-01-21 LAB — CBC
HCT: 37.8 % (ref 36.0–46.0)
Hemoglobin: 11.5 g/dL — ABNORMAL LOW (ref 12.0–15.0)
MCH: 27.1 pg (ref 26.0–34.0)
MCHC: 30.4 g/dL (ref 30.0–36.0)
MCV: 89.2 fL (ref 78.0–100.0)
Platelets: 389 10*3/uL (ref 150–400)
RBC: 4.24 MIL/uL (ref 3.87–5.11)
RDW: 14.5 % (ref 11.5–15.5)
WBC: 4 10*3/uL (ref 4.0–10.5)

## 2011-01-22 ENCOUNTER — Ambulatory Visit (HOSPITAL_BASED_OUTPATIENT_CLINIC_OR_DEPARTMENT_OTHER)
Admission: RE | Admit: 2011-01-22 | Discharge: 2011-01-22 | Disposition: A | Discharge status: Home / Self Care | Payer: Self-pay | Source: Ambulatory Visit | Attending: Family Medicine | Admitting: Family Medicine

## 2011-01-22 DIAGNOSIS — M25469 Effusion, unspecified knee: Secondary | ICD-10-CM | POA: Insufficient documentation

## 2011-01-22 DIAGNOSIS — M7120 Synovial cyst of popliteal space [Baker], unspecified knee: Secondary | ICD-10-CM | POA: Insufficient documentation

## 2011-01-22 DIAGNOSIS — M23349 Other meniscus derangements, anterior horn of lateral meniscus, unspecified knee: Secondary | ICD-10-CM | POA: Insufficient documentation

## 2011-01-22 DIAGNOSIS — M25669 Stiffness of unspecified knee, not elsewhere classified: Secondary | ICD-10-CM | POA: Insufficient documentation

## 2011-01-22 DIAGNOSIS — M6281 Muscle weakness (generalized): Secondary | ICD-10-CM | POA: Insufficient documentation

## 2011-01-22 DIAGNOSIS — M25569 Pain in unspecified knee: Secondary | ICD-10-CM | POA: Insufficient documentation

## 2011-01-22 DIAGNOSIS — R609 Edema, unspecified: Secondary | ICD-10-CM | POA: Insufficient documentation

## 2011-01-22 DIAGNOSIS — IMO0002 Reserved for concepts with insufficient information to code with codable children: Secondary | ICD-10-CM | POA: Insufficient documentation

## 2011-01-22 DIAGNOSIS — M25562 Pain in left knee: Secondary | ICD-10-CM

## 2011-01-22 DIAGNOSIS — M948X9 Other specified disorders of cartilage, unspecified sites: Secondary | ICD-10-CM | POA: Insufficient documentation

## 2011-01-22 DIAGNOSIS — M1710 Unilateral primary osteoarthritis, unspecified knee: Secondary | ICD-10-CM | POA: Insufficient documentation

## 2011-01-25 ENCOUNTER — Telehealth: Payer: Self-pay | Admitting: Family Medicine

## 2011-01-25 NOTE — Telephone Encounter (Signed)
Spoke with patient regarding her MRI result.  She does have a degenerative lateral meniscal tear but she has severe tricompartmental DJD.  Based on the severity of this, I do not think she would derive much benefit from arthroscopic surgery.  While the meniscal tear is likely a portion of her pain, it's likely a small percentage of this.  She would benefit from a total knee replacement but does not have insurance coverage for this.  Have sent a note to my CMA, Kathi Simpers, to call patient Wednesday to discuss the upfront costs at different orthopedic groups and any other options available to her (groups that allay costs) - she has had to get this information for other patients and should have that on hand.  We had discussed the other conservative treatment options and these have not helped unfortunately.  Last injection very minimal benefit.

## 2011-02-08 ENCOUNTER — Ambulatory Visit (INDEPENDENT_AMBULATORY_CARE_PROVIDER_SITE_OTHER): Payer: Self-pay | Admitting: Family Medicine

## 2011-02-08 VITALS — BP 142/97 | HR 67 | Temp 97.8°F | Ht 63.0 in | Wt 335.0 lb

## 2011-02-08 DIAGNOSIS — M25561 Pain in right knee: Secondary | ICD-10-CM

## 2011-02-08 DIAGNOSIS — M25569 Pain in unspecified knee: Secondary | ICD-10-CM

## 2011-02-08 NOTE — Patient Instructions (Addendum)
Most of your pain in this knee is from overuse (the bursitis below the knee, the pain throughout the knee) because your left knee arthritis is so bad. But you also pulled your medial hamstring when you felt that pull/pop a week ago. Ice your knee throughout 15 minutes at a time at least 3-4 times a day. You should take aleve 2 tabs twice a day with food for 1 week then as needed for pain and inflammation. We could consider cortisone shots in your knee and the area of bursitis but these are not the primary locations of your pain (the hamstring strain). Try ace wrap of your knee to keep swelling down and provide support. Hamstring curls 3 sets of 10 once a day without weights as I showed you. We are looking into getting you a wheelchair. If you do not continue to improve as expected, will consider cortisone shots as we discussed. Physical therapy is another option but will be difficult with your severe arthritis of your left knee.

## 2011-02-10 ENCOUNTER — Encounter: Payer: Self-pay | Admitting: Family Medicine

## 2011-02-10 DIAGNOSIS — M25561 Pain in right knee: Secondary | ICD-10-CM | POA: Insufficient documentation

## 2011-02-10 NOTE — Assessment & Plan Note (Signed)
Diffuse but most of her pain and what she describes is within medial hamstring tendon which feels intact to palpation, no bruising.  Also with tenderness at pes bursa consistent with medial hamstring strain that inserts in this area.  Start with conservative treatment - icing, hamstring curls, ace wrap for support, nsaids.  Consider PT, cortisone injection(s) if not improving over next 2-4 weeks.

## 2011-02-10 NOTE — Discharge Summary (Signed)
[name], [name] NO.:  [id number]  MEDICAL RECORD NO.:  [id number]  LOCATION:  3013                         FACILITY:  [facility]  PHYSICIAN:  Blanch Media, M.D.DATE OF BIRTH:  1956-04-11  DATE OF ADMISSION:  12/30/2010 DATE OF DISCHARGE:  01/03/2011                              DISCHARGE SUMMARY   DISCHARGE DIAGNOSES: 1. Incarcerated umbilical hernia status post repair. 2. Asthma. 3. Hypertension. 4. Anemia likely iron-deficiency.  DISCHARGE MEDICATIONS: 1. Colace 100 mg p.o. b.i.d. p.r.n. constipation. 2. MiraLax 17 grams p.o. daily p.r.n. for constipation. 3. Flexeril 10 mg p.o. b.i.d. p.r.n. muscle spasms. 4. Hydrocodone 5/325 mg p.o. q.4 hours p.r.n. pain. 5. Albuterol inhaler 90 mcg CT 2 puffs inhaled q.4 h p.r.n. shortness     of breath. 6. The patient is supposed to hold her lisinopril and HCTZ until     evaluation by Dr. Saralyn Pilar.  DISPOSITION AND FOLLOWUP:  [name] was discharged from [facility] on January 03, 2011 in stable and improved condition.  She only has mild tenderness around her umbilical hernia repair, however, no discharge or erythema.  She needs to continue to take her hydrocodone q.4 hour p.r.n. pain and will have an appointment with her PCP on January 20, 2011 [facility], Dr. Saralyn Pilar.  She will also have a follow-up appointment with [facility] with Dr. Darnell Level in 2-3 weeks.  The patient was instructed to call for appointment.  At that time, he will need to 1. Reevaluate her blood pressure and restart her lisinopril/HCTZ as     appropriate.  We did held her blood pressure medication during     hospital course because the patient's blood pressure was on the     soft side. 2. Please follow up on her blood culture.  Preliminary report on December 30, 2010 showed no growth to date at the time of discharge. 3. Please also follow up on her anemia panel.  The patient did have  a     ferritin level of 14 in 2009.  She may need additional     supplementation of ferrous sulfate.  CONSULTATION:  [facility] with Dr. Violeta Gelinas and Dr. Darnell Level.  PROCEDURE PERFORMED: 1. Knee x-ray on December 30, 2010 shows advanced degenerative changes,     most pronounced in the patellofemoral compartment.  No other acute     findings. 2. CT of abdomen and pelvis with contrast on December 30, 2010 shows no     acute finding.  Periumbilical hernia, unchanged.  ADMISSION HISTORY:  [name] is a 55 year old woman with past medical history of hypertension, hyperlipidemia, morbid obesity, and a known umbilical hernia, who presents to clinic for progressive worsening periumbilical/right upper quadrant abdominal pain which has been going on since November 2011.  The patient was seen in the ER in January 2012 and was diagnosed with likely incarcerated umbilical hernia.  The patient was recommended to follow up with [facility] and was given an appointment.  However, the patient did not follow up secondary to financial concerns.  The patient described her pain as constant, sharp abdominal pain, rated  as 9/10 in severity.  Pain was aggravated by activity, coughing, and eating.  She also had difficult time of keeping food down.  The symptom was mildly improved with direct massages.  The patient also reports less frequent bowel movements, which occur every 3-4 days.  She denies any blood in her stool, fever, chills, malaise, dysuria, or hematuria.  Denies any vomiting.  ADMISSION PHYSICAL EXAMINATION:  VITAL SIGNS:  Temperature 98.3, pulse 80, blood pressure 145/89, respirations 16, O2 sat 100% on room air. GENERAL:  Well-developed, well-nourished, in no acute distress, alert and oriented. HEAD:  Normocephalic, atraumatic. NECK:  No deformities masses or tenderness noted. LUNGS:  Normal respiratory effort, clear to auscultation bilaterally without crackle  or wheezes. HEART:  Regular rate and rhythm, S1-S2 normal.  No murmur, gallop, or rub. ABDOMEN:  Soft, nondistended, normoactive bowel sounds.  No masses or organomegaly.  Reducible umbilical hernia.  Tenderness to palpation over epigastric region and right upper quadrant.  Negative Murphy sign.  No guarding.  No rebound tenderness.  There is a scar in umbilicus region from prior tubal ligation. EXTREMITIES:  Trace edema. NEUROLOGIC:  Normal.  Alert and oriented.  Cranial nerves II through XII grossly intact.  Motor strength 5/5 in all extremities, intact sensation, reflexes +2.  Normal gait.  ADMISSION LABS:  WBC 4.8, hemoglobin 11.3, hematocrit 34.5, platelet 352, absolute neutrophil 2.4.  Sodium 140, potassium 3.8, chloride 103, bicarb 30, BUN 12, creatinine 0.64, glucose 84, total bili 0.4, alk phos 105, AST 11, ALT 8, total protein 6.9, albumin 3.5, calcium 9.9, lipase 18.  UA shows trace leukocyte.  Urine microscopy showed many squamous, wbc 0-2, bacteria is rare.  HOSPITAL COURSE: 1. Abdominal pain was secondary to her incarcerated umbilical hernia,     as seen on previous CT scan.  The patient received a repeat CT scan on     admission, which shows an unchanged periumbilical hernia, however,     the patient was symptomatic, therefore, we consulted [facility] for surgical intervention.  The patient was taken     to the OR on December 31, 2010 by Dr. Gerrit Friends for umbilical hernia     repair without complication.  The patient's pain was controlled     with hydrocodone and Dilaudid p.r.n.  We also checked blood     cultures x2 with a preliminary report as no growth to date at the     time of discharge.  The patient also had a normal lipase, which make     pancreatitis less likely.  The patient tolerated oral intake and     regular diet well.  The patient will have follow-up appointment     with Dr. Gerrit Friends in 2-3 weeks post discharge. 2. Asthma.  It was stable and no  wheezes on physical examination.  We     continue albuterol inhaler p.r.n. shortness of breath. 3. Hypertension.  The patient's blood pressure continued to fluctuate     but mostly on the soft side, therefore, we held her lisinopril and     HCTZ during her hospital course.  The patient was instructed to     hold her medications until she follow up with Dr. Saralyn Pilar     for repeat blood pressure check.  She may need to restart her     lisinopril/HCTZ at her follow-up appointment on January 20, 2011. 4. Anemia.  The patient likely has iron deficiency with a ferritin level  14 in 2009.  The patient has a baseline of hemoglobin between 11     and 12.  On admission, the patient was at her baseline with a     hemoglobin of 11.3.  Her CBC showed that her hemoglobin remained     stable and we also checked an anemia panel to see if the patient     need additional supplementation of ferrous sulfate.  Anemia panel     result was pending at discharge and her primary care physician will     follow up as outpatient. 5. Constipation.  This is likely secondary to her opiates.  The     patient did have normal bowel sounds on examination as well as     passing flatulence.  The patient was given Senokot as well as     MiraLax during hospital course and she refused Fleet enema.  She     will be sent home with Colace 100 mg p.o. b.i.d. as well as MiraLax     p.r.n. for constipation. 6. DVT prophylaxis.  The patient received Lovenox 40 mg subcu daily.  DISCHARGE VITAL SIGNS:  Temperature 98.4, pulse 70, blood pressure 126/63, respirations 20, O2 sat 100% on room air.  DISCHARGE LABS:  WBC 5.4, hemoglobin 11.5, hematocrit 34.5, platelet was clumped on the smear, counts appeared adequate, neutrophil 47%, ANC 2.5. Sodium 138, potassium 4.6, chloride 101, bicarb 31, BUN 10, creatinine 0.65, glucose 102, calcium 9.7.    ______________________________ Carrolyn Meiers,  MD   ______________________________ Blanch Media, M.D.    MH/MEDQ  D:  01/03/2011  T:  01/04/2011  Job:  [id number]  cc:   Johnette Abraham, DO Velora Heckler, MD  Electronically Signed by Carrolyn Meiers MD on 01/24/2011 09:17:57 AM Electronically Signed by Blanch Media M.D. on 02/10/2011 10:30:53 AM

## 2011-02-10 NOTE — Progress Notes (Addendum)
Subjective:    Patient ID: [name], female    DOB: [birth year], 55 y.o.   MRN: [id number]  PCP: Saralyn Pilar  Knee Pain    55 yo F here with known end stage left knee DJD now returns for worsening right knee pain  6/21: Patient reports > 2 years of left knee pain - believes it started about when she had fallen down directly onto her left knee. Since then has had diffuse pain within left knee, swelling, some bruising. Had x-rays showing advanced DJD in all compartments back in 2009. She reports having had an MRI remotely but we have no report of this. She has tried different OTC meds, vicodin, cortisone shot (1 1/2 years ago - only 3 days of relief with this). Did home exercises as well though pain limited how much she could do. She reports that it catches, locks up, gives out but not in one specific location. Applied for medicaid but was denied - was going to try to get in with a surgeon to discuss possible knee replacement.  7/11: Patient reports pain still a 7/10 despite cortisone injection 3 weeks ago - only helped for a few days and did not last. Taking narcotic pain medication. Pain starting to radiate into calf and up to hip. Massage mild help. Difficult to bear weight on left leg due to knee pain.  7/31: Patient reports pain has become severe now in right leg, mostly posterior and medial. Reports when going to get up she felt a pull/pop in this area. No bruising but feels more swollen and now with pain across anterior knee. No prior problems with this knee. Has known end stage DJD in left knee - awaiting medicaid to go through now so she has some help in coverage for total knee replacement. Walks with a walker around house but very difficult with her knee pain bilaterally.    Past Medical History  Diagnosis Date  . Hypertension   . Hyperlipidemia   . Asthma   . Obesity   . Lower extremity edema     Chronic. 2D echo (2005) - EF 65%.  . Seasonal allergies   .  Abnormal EKG 06/2003    History of inverted T waves V1-V3. Normal 2D echo (07/23/2003): LVEF 65%.  . History of multiple pulmonary nodules     Incidental finding: CT Abd/ Pelvis (04/2010) - Several small lower lobe lung nodules, including one pure ground-glass pulmonary nodule measuring 8 mm in the left lower lobe.  Recommend follow-up chest CT (IV contrast preferred) in 6 months to document stability. //  CT Abd/ Pelvis (07/2010) -  3 mm RLL and  8 mm LLL nodule stable.  Other nodules unchanged, likely benign.  . 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 Followed by Sports Medicine, Dr. Jennette Kettle.  . Incarcerated ventral hernia 04/2010    Noted on CT Abd/ pelvis (04/2010). Patient now s/p ventral hernia repair by Dr. Gerrit Friends (12/2010)  . Anemia     BL Hgb 11-12. Ferritin 14 - low normal (08/2007). Colonoscopy 2009 - external hemorrhoids (excellent prep). Last anemia panel (12/2010) - Iron  24, TIBC 269,  B12  316, Folate 11.9, Ferritin 73.    Current Outpatient Prescriptions on File Prior to Visit  Medication Sig Dispense Refill  . albuterol (PROVENTIL HFA;VENTOLIN HFA) 108 (90 BASE) MCG/ACT inhaler Inhale 2 puffs into the lungs every 6 (six) hours as needed for wheezing or shortness of breath.  1 Inhaler  5  . cyclobenzaprine (FLEXERIL) 10 MG tablet Take 1 tablet (10 mg total) by mouth 2 (two) times daily as needed. For pain  30 tablet  0  . docusate sodium (COLACE) 100 MG capsule Take 1 capsule (100 mg total) by mouth 2 (two) times daily.  60 capsule  3  . lisinopril-hydrochlorothiazide (PRINZIDE,ZESTORETIC) 20-25 MG per tablet Take 1 tablet by mouth daily.          Past Surgical History  Procedure Date  . Incision and drainage abscess anal 07/2008    I&D and debridgement of anorectal abscess  . Inguinal hernia repair   . Tubal ligation   . Incisional hernia repair 12/2010    Repair of incarcerated ventral incisional hernia with Ethicon mesh patch - performed by Dr. Gerrit Friends.      Allergies  Allergen Reactions  . Toradol Palpitations    History   Social History  . Marital Status: Married    Spouse Name: N/A    Number of Children: N/A  . Years of Education: N/A   Occupational History  . Not on file.   Social History Main Topics  . Smoking status: Former Smoker    Types: Cigarettes    Quit date: 09/10/1998  . Smokeless tobacco: Not on file  . Alcohol Use: No  . Drug Use: No  . Sexually Active: Yes -- Female partner(s)   Other Topics Concern  . Not on file   Social History Narrative  . No narrative on file    Family History  Problem Relation Age of Onset  . Diabetes Mother   . Heart attack Neg Hx   . Hypertension Neg Hx     BP 142/97  Pulse 67  Temp(Src) 97.8 F (36.6 C) (Oral)  Ht 5\' 3"  (1.6 m)  Wt 335 lb (151.955 kg)  BMI 59.34 kg/m2  LMP 09/09/2009  Review of Systems  See HPI above.    Objective:   Physical Exam  Gen: NAD, obese R knee: No gross deformity, ecchymoses, effusion. Mod TTP medial hamstring but feels intact.  Diffuse anterior TTP including joint lines, pes insertion. FROM. Negative ant/post drawers. Negative valgus/varus testing. Negative lachmanns. Negative mcmurrays, apleys, patellar apprehension, clarkes.  Has pain with mcmurrays and apleys but is diffuse and no click. NV intact distally.      Assessment & Plan:  1. Right knee pain - Diffuse but most of her pain and what she describes is within medial hamstring tendon which feels intact to palpation, no bruising.  Also with tenderness at pes bursa consistent with medial hamstring strain that inserts in this area.  Start with conservative treatment - icing, hamstring curls, ace wrap for support, nsaids.  Consider PT, cortisone injection(s) if not improving over next 2-4 weeks.  Addendum: Also recommended patient get a wheelchair - she is going to get this through her PCP's office to help rest both of her knees.  Stressed she needs to continue with home  exercises though, hamstring and quad strengthening.

## 2011-02-11 ENCOUNTER — Other Ambulatory Visit: Payer: Self-pay | Admitting: Internal Medicine

## 2011-02-11 DIAGNOSIS — M1990 Unspecified osteoarthritis, unspecified site: Secondary | ICD-10-CM

## 2011-02-23 ENCOUNTER — Encounter: Payer: Self-pay | Admitting: Internal Medicine

## 2011-02-23 ENCOUNTER — Ambulatory Visit (INDEPENDENT_AMBULATORY_CARE_PROVIDER_SITE_OTHER): Payer: Self-pay | Admitting: Internal Medicine

## 2011-02-23 DIAGNOSIS — I10 Essential (primary) hypertension: Secondary | ICD-10-CM

## 2011-02-23 DIAGNOSIS — M7989 Other specified soft tissue disorders: Secondary | ICD-10-CM | POA: Insufficient documentation

## 2011-02-23 DIAGNOSIS — M1990 Unspecified osteoarthritis, unspecified site: Secondary | ICD-10-CM

## 2011-02-23 LAB — TSH: TSH: 2.105 u[IU]/mL (ref 0.350–4.500)

## 2011-02-23 MED ORDER — HYDROCODONE-ACETAMINOPHEN 5-500 MG PO TABS: 1.0000 | ORAL_TABLET | Freq: Every evening | ORAL | Status: AC | PRN

## 2011-02-23 MED ORDER — FUROSEMIDE 40 MG PO TABS: 20.0000 mg | ORAL_TABLET | Freq: Every day | ORAL | Status: DC

## 2011-02-23 MED ORDER — IBUPROFEN 800 MG PO TABS: 800.0000 mg | ORAL_TABLET | Freq: Three times a day (TID) | ORAL | Status: DC | PRN

## 2011-02-23 NOTE — Assessment & Plan Note (Signed)
BP on arrival was 170/80's but repeat BP was 148/82.  I would avoid Amlodipine at this time because of her significant leg swelling and would not want to exacerbate that. Will continue lisinopril-hctz 20-25mg  qd -Add Lasix 20mg  qd in setting on leg swelling, her baseline Cr is 0.6-0.7 which is WNL -Consider 2-D echo to evaluate for heart failure even though her 2D echo in 2005 showed normal EF of 65% and normal right ventricle -Will recheck her BMP next office visit

## 2011-02-23 NOTE — Progress Notes (Signed)
HPI: [name] is a 55 yo woman with PMH of asthma, bilateral knee pain 2/2 DJD, recent ventral hernia repair in 12/2010 presents today for BP follow up and refill on her pain medication.  Patient has been taking lisinopril-hctz 20/25mg  as prescribed.  She does have severe pain from her knee pain; however, she denies any pain while in the office today because she is sitting in a wheelchair which helps significantly.  She also states that Vicodin and Ibuprofen help her pain.  She is currently followed by sport medicine and is waiting for knee replacement once her medicaid is approved.  She also complains of bilaterally leg swelling in the past 3 months that is associated with some SOB when she tries to exert herself.  She said that she feels chills and clammy occasionally but denies any fever, erythema, or drainage from her legs.   -Constipation is now resolved, she is moving her bowel 3 times per week without any laxatives.   ROS: as per HPI  PE: General: alert, well-developed sitting in wheelchair, and cooperative to examination.  Lungs: normal respiratory effort, no accessory muscle use, normal breath sounds, no crackles, and no wheezes. Heart: normal rate, regular rhythm, no murmur, no gallop, and no rub.  Abdomen: soft, non-tender, normal bowel sounds, no distention, no guarding, no rebound tenderness. Healed ventral hernia repair scar Msk: no joint swelling, no joint warmth, and no redness over joints.  Pulses: 2+ DP/PT pulses bilaterally Extremities: No cyanosis, clubbing, +2 pitting edema, limited ROM of LE bilaterally 2/2 pain Neurologic: alert & oriented X3, cranial nerves II-XII intact, strength normal in all extremities, sensation intact to light touch, and unable to assess gait.  Skin: dry skin on LE.  Psych: Oriented X3, memory intact for recent and remote, normally interactive, good eye contact, not anxious appearing, and not depressed appearing.

## 2011-02-23 NOTE — Patient Instructions (Addendum)
-  You can take Ibuprofen 800mg  one tablet every 6-8 hours as needed for pain and take one tablet of Vicodin at bed time if needed -Continue taking lisinopril-hctz 20/25mg  one tablet daily for your blood pressure -Start Lasix 20mg  one tablet daily- this is a water pill to help with your swelling -Will send for 2D echocardiogram- this is an ultrasound of your heart to we can evaluate your heart function -Follow up in 1-2 weeks for repeat blood work and swelling evaluation, and blood pressure recheck

## 2011-02-23 NOTE — Assessment & Plan Note (Signed)
Patient does have +2 pitting edema bilaterally x 3 months in duration.  No sign of infection at this time therefore, cellulitis is unlikely.  Differential diagnosis include venous insufficiency vs. Right heart failure, vs. Thyroid disease. -Will send patient for 2D echo to evaluate her heart function (appt 03/02/11). -Will check TSH. -Try low dose Lasix 20mg  po qdaily and recheck BMP in 1-2 weeks since her Cr is wnl (0.6-0.7)

## 2011-02-23 NOTE — Assessment & Plan Note (Addendum)
Severe.  Currently being managed by sport medicine.  She states that Ibuprofen actually helps her a lot during the day and that she takes 1 tablet of Vicodin at night.  She is awaiting for knee replacement once medicaid is approved.  Currently using wheelchair which also helps alleviates her pain.   Will give Ibuprofen 800mg   Refill Vicodin 5 mg q4-6 hours prn so she can use at bedtime #30 May need to sign pain contract if she requires narcotic chronically.

## 2011-03-02 ENCOUNTER — Ambulatory Visit (HOSPITAL_COMMUNITY)
Admission: RE | Admit: 2011-03-02 | Discharge: 2011-03-02 | Disposition: A | Discharge status: Home / Self Care | Payer: Medicaid Other | Source: Ambulatory Visit | Attending: Internal Medicine | Admitting: Internal Medicine

## 2011-03-02 DIAGNOSIS — M7989 Other specified soft tissue disorders: Secondary | ICD-10-CM | POA: Insufficient documentation

## 2011-03-02 DIAGNOSIS — R0609 Other forms of dyspnea: Secondary | ICD-10-CM | POA: Insufficient documentation

## 2011-03-02 DIAGNOSIS — I10 Essential (primary) hypertension: Secondary | ICD-10-CM | POA: Insufficient documentation

## 2011-03-02 DIAGNOSIS — R0989 Other specified symptoms and signs involving the circulatory and respiratory systems: Secondary | ICD-10-CM | POA: Insufficient documentation

## 2011-03-02 DIAGNOSIS — I517 Cardiomegaly: Secondary | ICD-10-CM

## 2011-03-04 ENCOUNTER — Ambulatory Visit (INDEPENDENT_AMBULATORY_CARE_PROVIDER_SITE_OTHER): Payer: Self-pay | Admitting: Family Medicine

## 2011-03-04 ENCOUNTER — Encounter: Payer: Self-pay | Admitting: Internal Medicine

## 2011-03-04 ENCOUNTER — Ambulatory Visit (INDEPENDENT_AMBULATORY_CARE_PROVIDER_SITE_OTHER): Payer: Self-pay | Admitting: Internal Medicine

## 2011-03-04 ENCOUNTER — Encounter: Payer: Self-pay | Admitting: Family Medicine

## 2011-03-04 VITALS — BP 116/76 | HR 91 | Temp 98.1°F | Ht 62.0 in | Wt 321.0 lb

## 2011-03-04 DIAGNOSIS — M25569 Pain in unspecified knee: Secondary | ICD-10-CM

## 2011-03-04 DIAGNOSIS — M7989 Other specified soft tissue disorders: Secondary | ICD-10-CM

## 2011-03-04 DIAGNOSIS — M25561 Pain in right knee: Secondary | ICD-10-CM

## 2011-03-04 DIAGNOSIS — I10 Essential (primary) hypertension: Secondary | ICD-10-CM

## 2011-03-04 DIAGNOSIS — G44209 Tension-type headache, unspecified, not intractable: Secondary | ICD-10-CM | POA: Insufficient documentation

## 2011-03-04 NOTE — Progress Notes (Signed)
Subjective:   Patient ID: [name] female   DOB: 08-17-1955 55 y.o.   MRN: [id number]  HPI: [name] is a 55 y.o. woman who presents to clinic for follow up from her last appointment.  She was started on Lasix at her last appointment.  She has been taking the medication and states that the swelling in her legs is better.  She states that the swelling in the lower legs is much better and even higher up in the thighs.  She is in need of a bmet to monitor her creatinine and potassium after starting lasix.   She also is complaining of some headaches.  She states that they come on quickly and are usually in the front of her head and sometimes in the back of her head.  She occasionally gets grabbing pain in the side of her neck.  She states the headaches are throbbing in nature and get better when she pressed on her head.  She hasn't tried using her pain medication for the headaches.  She states that she occasionally gets nausea but no vomiting, some light sensitivity, and sensitivity to smells.  The headaches last a few hours then go away.  They seem to be worse in the evening and when she is going to bed.  They have not gotten better or worse in the last week or so since starting Lasix.   Past Medical History  Diagnosis Date  . Hypertension   . Hyperlipidemia   . Asthma   . Obesity   . Lower extremity edema     Chronic. 2D echo (2005) - EF 65%.  . Seasonal allergies   . Abnormal EKG 06/2003    History of inverted T waves V1-V3. Normal 2D echo (07/23/2003): LVEF 65%.  . History of multiple pulmonary nodules     Incidental finding: CT Abd/ Pelvis (04/2010) - Several small lower lobe lung nodules, including one pure ground-glass pulmonary nodule measuring 8 mm in the left lower lobe.  Recommend follow-up chest CT (IV contrast preferred) in 6 months to document stability. //  CT Abd/ Pelvis (07/2010) -  3 mm RLL and  8 mm LLL nodule stable.  Other nodules unchanged, likely benign.  .  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 Followed by Sports Medicine, Dr. Jennette Kettle.  . Incarcerated ventral hernia 04/2010    Noted on CT Abd/ pelvis (04/2010). Patient now s/p ventral hernia repair by Dr. Gerrit Friends (12/2010)  . Anemia     BL Hgb 11-12. Ferritin 14 - low normal (08/2007). Colonoscopy 2009 - external hemorrhoids (excellent prep). Last anemia panel (12/2010) - Iron  24, TIBC 269,  B12  316, Folate 11.9, Ferritin 73.   Current Outpatient Prescriptions  Medication Sig Dispense Refill  . albuterol (PROVENTIL HFA;VENTOLIN HFA) 108 (90 BASE) MCG/ACT inhaler Inhale 2 puffs into the lungs every 6 (six) hours as needed for wheezing or shortness of breath.  1 Inhaler  5  . cyclobenzaprine (FLEXERIL) 10 MG tablet Take 1 tablet (10 mg total) by mouth 2 (two) times daily as needed. For pain  30 tablet  0  . docusate sodium (COLACE) 100 MG capsule Take 1 capsule (100 mg total) by mouth 2 (two) times daily.  60 capsule  3  . furosemide (LASIX) 40 MG tablet Take 0.5 tablets (20 mg total) by mouth daily.  30 tablet  3  . HYDROcodone-acetaminophen (VICODIN) 5-500 MG per tablet Take 1 tablet by mouth at  bedtime as needed for pain.  30 tablet  2  . ibuprofen (ADVIL,MOTRIN) 800 MG tablet Take 1 tablet (800 mg total) by mouth every 8 (eight) hours as needed for pain.  90 tablet  2  . lisinopril-hydrochlorothiazide (PRINZIDE,ZESTORETIC) 20-25 MG per tablet Take 1 tablet by mouth daily.         Family History  Problem Relation Age of Onset  . Diabetes Mother   . Heart attack Neg Hx   . Hypertension Neg Hx    History   Social History  . Marital Status: Married    Spouse Name: N/A    Number of Children: N/A  . Years of Education: N/A   Social History Main Topics  . Smoking status: Former Smoker    Types: Cigarettes    Quit date: 09/10/1998  . Smokeless tobacco: None  . Alcohol Use: No  . Drug Use: No  . Sexually Active: Yes -- Female partner(s)   Other Topics Concern  . None    Social History Narrative  . None   Review of Systems: Constitutional: Denies fever, chills, diaphoresis, appetite change and fatigue.  HEENT: Denies photophobia, eye pain, redness, hearing loss, ear pain, congestion, sore throat, rhinorrhea, sneezing, mouth sores, trouble swallowing, neck pain, neck stiffness and tinnitus.   Respiratory: Denies SOB, DOE, cough, chest tightness,  and wheezing.   Cardiovascular: Denies chest pain, palpitations and leg swelling.  Gastrointestinal: Denies nausea, vomiting, abdominal pain, diarrhea, constipation, blood in stool and abdominal distention.  Genitourinary: Denies dysuria, urgency, frequency, hematuria, flank pain and difficulty urinating.  Musculoskeletal: Denies myalgias, back pain, joint swelling, arthralgias and gait problem.  Skin: Denies pallor, rash and wound.  Neurological: Denies dizziness, seizures, syncope, weakness, light-headedness, numbness and headaches.  Hematological: Denies adenopathy. Easy bruising, personal or family bleeding history  Psychiatric/Behavioral: Denies suicidal ideation, mood changes, confusion, nervousness, sleep disturbance and agitation  Objective:  Physical Exam: Filed Vitals:   03/04/11 1643  BP: 138/81  Pulse: 80  Temp: 98.4 F (36.9 C)  TempSrc: Oral  Height: 5\' 2"  (1.575 m)  Weight: 335 lb 3.2 oz (152.046 kg)  SpO2: 98%   Constitutional: Vital signs reviewed.  Patient is a morbidly obese woman in a wheelchair.  She is in no acute distress and cooperative with exam. Alert and oriented x3.  Head: Normocephalic and atraumatic Ear: TM normal bilaterally Mouth: no erythema or exudates, MMM Eyes: PERRL, EOMI, conjunctivae normal, No scleral icterus.  Neck: Supple, Trachea midline, ROM limited in lateral bending, but not in flexion, extension, or twisting.  There is mild pain with twisting and bending worse on the left then the right, No JVD, mass, thyromegaly, or carotid bruit present.  Cardiovascular:  RRR, S1 normal, S2 normal, no MRG, pulses symmetric and intact bilaterally Pulmonary/Chest: distant breath sounds, CTAB, no wheezes, rales, or rhonchi Abdominal: Soft. Non-tender, non-distended, bowel sounds are normal, no masses, organomegaly, or guarding present.  GU: no CVA tenderness Musculoskeletal: there are two bandages on the right knee where the patient received steroid injections today at her Sports medicine doctor.  Skin changes consistent with stasis dermatitis, trace swelling in the lower extermities bilaterally.  Trace edema over the ITP band bilaterally.  No joint deformities, erythema, or stiffness, ROM full and no nontender Hematology: no cervical, inginal, or axillary adenopathy.  Neurological: A&O x3, Strength is normal and symmetric bilaterally, cranial nerve II-XII are grossly intact, no focal motor deficit, sensory intact to light touch bilaterally.  Skin: Warm, dry and intact. No  cyanosis, or clubbing.  Psychiatric: Normal mood and affect. speech and behavior is normal. Judgment and thought content normal. Cognition and memory are normal.   Assessment & Plan:

## 2011-03-04 NOTE — Patient Instructions (Signed)
Continue your medications as prescribed.  I will call if there is anything from the testing that we need to follow up on.  You can use the ice pack or heat packs 20 minutes at a time several times daily for your neck.  Which ever works best is fine.  Also stretching your neck several times daily can help as well.  Follow up with Dr. Saralyn Pilar in 1-2 months.

## 2011-03-05 LAB — BASIC METABOLIC PANEL
BUN: 16 mg/dL (ref 6–23)
CO2: 29 mEq/L (ref 19–32)
Calcium: 10.6 mg/dL — ABNORMAL HIGH (ref 8.4–10.5)
Chloride: 100 mEq/L (ref 96–112)
Creat: 0.86 mg/dL (ref 0.50–1.10)
Glucose, Bld: 90 mg/dL (ref 70–99)
Potassium: 4.2 mEq/L (ref 3.5–5.3)
Sodium: 137 mEq/L (ref 135–145)

## 2011-03-05 NOTE — Assessment & Plan Note (Signed)
BP Readings from Last 3 Encounters:  03/04/11 138/81  03/04/11 116/76  02/23/11 170/88   Basic Metabolic Panel:    Component Value Date/Time   NA 137 03/04/2011 1659   K 4.2 03/04/2011 1659   CL 100 03/04/2011 1659   CO2 29 03/04/2011 1659   BUN 16 03/04/2011 1659   CREATININE 0.86 03/04/2011 1659   CREATININE 0.65 01/03/2011 0540   GLUCOSE 90 03/04/2011 1659   CALCIUM 10.6* 03/04/2011 1659   Blood pressure today is much improved from her previous visit.  She is having no side effects from the new medication.  Creatinine is mildly increased but not significantly and her potassium is normal so she is tolerating the medication well.  We will continue to monitor her swelling and her blood pressure.  I encouraged her to make a follow up with her PCP Dr. Saralyn Pilar at her earliest appointment.

## 2011-03-05 NOTE — Assessment & Plan Note (Signed)
Her symptoms are consistent with a tension headache.  She has some limited ROM in the neck and some tension in the muscles of the neck that is obvious on exam.  We discussed using tylenol, ibuprofen, ice, heat, and stretching to help treat her neck muscles which should cut down on her headache frequency.

## 2011-03-05 NOTE — Assessment & Plan Note (Signed)
Leg swelling is improved from previous notes.  We will continue her lasix dose for now and follow.

## 2011-03-07 ENCOUNTER — Encounter (INDEPENDENT_AMBULATORY_CARE_PROVIDER_SITE_OTHER): Payer: Self-pay | Admitting: Surgery

## 2011-03-07 ENCOUNTER — Encounter: Payer: Self-pay | Admitting: Family Medicine

## 2011-03-07 NOTE — Progress Notes (Signed)
Subjective:    Patient ID: [name], female    DOB: [birth year], 55 y.o.   MRN: [id number]  PCP: Saralyn Pilar  Leg Pain   Knee Pain    55 yo F here with known end stage left knee DJD now returns for worsening right knee pain  6/21: Patient reports > 2 years of left knee pain - believes it started about when she had fallen down directly onto her left knee. Since then has had diffuse pain within left knee, swelling, some bruising. Had x-rays showing advanced DJD in all compartments back in 2009. She reports having had an MRI remotely but we have no report of this. She has tried different OTC meds, vicodin, cortisone shot (1 1/2 years ago - only 3 days of relief with this). Did home exercises as well though pain limited how much she could do. She reports that it catches, locks up, gives out but not in one specific location. Applied for medicaid but was denied - was going to try to get in with a surgeon to discuss possible knee replacement.  7/11: Patient reports pain still a 7/10 despite cortisone injection 3 weeks ago - only helped for a few days and did not last. Taking narcotic pain medication. Pain starting to radiate into calf and up to hip. Massage mild help. Difficult to bear weight on left leg due to knee pain.  7/31: Patient reports pain has become severe now in right leg, mostly posterior and medial. Reports when going to get up she felt a pull/pop in this area. No bruising but feels more swollen and now with pain across anterior knee. No prior problems with this knee. Has known end stage DJD in left knee - awaiting medicaid to go through now so she has some help in coverage for total knee replacement. Walks with a walker around house but very difficult with her knee pain bilaterally.    8/24: Patient still reports medicaid has not gone through. Pain in right knee continues.  Has been icing and now has a wheelchair to help her get around house. States she is  doing hamstring curls. Pain mostly anteromedial right knee now. Taking ibuprofen and vicodin for pain. Has been started on lasix for lower extremity edema.  Past Medical History  Diagnosis Date  . Hypertension   . Hyperlipidemia   . Asthma   . Obesity   . Lower extremity edema     Chronic. 2D echo (2005) - EF 65%.  . Seasonal allergies   . Abnormal EKG 06/2003    History of inverted T waves V1-V3. Normal 2D echo (07/23/2003): LVEF 65%.  . History of multiple pulmonary nodules     Incidental finding: CT Abd/ Pelvis (04/2010) - Several small lower lobe lung nodules, including one pure ground-glass pulmonary nodule measuring 8 mm in the left lower lobe.  Recommend follow-up chest CT (IV contrast preferred) in 6 months to document stability. //  CT Abd/ Pelvis (07/2010) -  3 mm RLL and  8 mm LLL nodule stable.  Other nodules unchanged, likely benign.  . 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 Followed by Sports Medicine, Dr. Jennette Kettle.  . Incarcerated ventral hernia 04/2010    Noted on CT Abd/ pelvis (04/2010). Patient now s/p ventral hernia repair by Dr. Gerrit Friends (12/2010)  . Anemia     BL Hgb 11-12. Ferritin 14 - low normal (08/2007). Colonoscopy 2009 - external hemorrhoids (excellent prep). Last anemia panel (12/2010) -  Iron  24, TIBC 269,  B12  316, Folate 11.9, Ferritin 73.    Current Outpatient Prescriptions on File Prior to Visit  Medication Sig Dispense Refill  . albuterol (PROVENTIL HFA;VENTOLIN HFA) 108 (90 BASE) MCG/ACT inhaler Inhale 2 puffs into the lungs every 6 (six) hours as needed for wheezing or shortness of breath.  1 Inhaler  5  . cyclobenzaprine (FLEXERIL) 10 MG tablet Take 1 tablet (10 mg total) by mouth 2 (two) times daily as needed. For pain  30 tablet  0  . docusate sodium (COLACE) 100 MG capsule Take 1 capsule (100 mg total) by mouth 2 (two) times daily.  60 capsule  3  . furosemide (LASIX) 40 MG tablet Take 0.5 tablets (20 mg total) by mouth daily.   30 tablet  3  . lisinopril-hydrochlorothiazide (PRINZIDE,ZESTORETIC) 20-25 MG per tablet Take 1 tablet by mouth daily.          Past Surgical History  Procedure Date  . Incision and drainage abscess anal 07/2008    I&D and debridgement of anorectal abscess  . Inguinal hernia repair   . Tubal ligation   . Incisional hernia repair 12/2010    Repair of incarcerated ventral incisional hernia with Ethicon mesh patch - performed by Dr. Gerrit Friends.     Allergies  Allergen Reactions  . Toradol Palpitations    History   Social History  . Marital Status: Married    Spouse Name: N/A    Number of Children: N/A  . Years of Education: N/A   Occupational History  . Not on file.   Social History Main Topics  . Smoking status: Former Smoker    Types: Cigarettes    Quit date: 09/10/1998  . Smokeless tobacco: Not on file  . Alcohol Use: No  . Drug Use: No  . Sexually Active: Yes -- Female partner(s)   Other Topics Concern  . Not on file   Social History Narrative  . No narrative on file    Family History  Problem Relation Age of Onset  . Diabetes Mother   . Heart attack Neg Hx   . Hypertension Neg Hx     BP 116/76  Pulse 91  Temp(Src) 98.1 F (36.7 C) (Oral)  Ht 5\' 2"  (1.575 m)  Wt 321 lb (145.605 kg)  BMI 58.71 kg/m2  LMP 09/09/2009  Review of Systems  See HPI above.    Objective:   Physical Exam  Gen: NAD, obese R knee: No gross deformity, ecchymoses, effusion. Diffuse anterior TTP including joint lines, pes insertion.  Less TTP medial hamstring (improved from last visit). FROM. Negative ant/post drawers. Negative valgus/varus testing. Negative lachmanns. Negative mcmurrays, apleys, patellar apprehension, clarkes.  Has pain with mcmurrays and apleys but is diffuse and no click. NV intact distally.    Assessment & Plan:  1. Right knee pain - 2/2 DJD and pes bursitis.  Hamstring has improved - will continue home exercises.  Icing, ace, ibuprofen as needed.   Advised to call us when medicaid goes through to refer her for consideration of total knee replacement (injections not helping very much - she wanted to try these again today though).    After informed written consent, patient was seated in wheelchair. Right knee was prepped with alcohol swab and utilizing anteromedial approach, patient's right knee was injected intraarticularly with 3:1 marcaine: depomedrol. Patient tolerated the procedure well without immediate complications.  Area overlying right pes anserine bursa was prepped with alcohol swab then injected with  2:1 marcaine: depomedrol which she tolerated well without complications.

## 2011-03-07 NOTE — Assessment & Plan Note (Signed)
2/2 DJD and pes bursitis.  Hamstring has improved - will continue home exercises.  Icing, ace, ibuprofen as needed.  Advised to call us when medicaid goes through to refer her for consideration of total knee replacement (injections not helping very much - she wanted to try these again today though).    After informed written consent, patient was seated in wheelchair. Right knee was prepped with alcohol swab and utilizing anteromedial approach, patient's right knee was injected intraarticularly with 3:1 marcaine: depomedrol. Patient tolerated the procedure well without immediate complications.  Area overlying right pes anserine bursa was prepped with alcohol swab then injected with 2:1 marcaine: depomedrol which she tolerated well without complications.

## 2011-03-09 ENCOUNTER — Ambulatory Visit (INDEPENDENT_AMBULATORY_CARE_PROVIDER_SITE_OTHER): Payer: PRIVATE HEALTH INSURANCE | Admitting: Surgery

## 2011-03-09 ENCOUNTER — Encounter (INDEPENDENT_AMBULATORY_CARE_PROVIDER_SITE_OTHER): Payer: Self-pay | Admitting: Surgery

## 2011-03-09 VITALS — BP 146/84 | Temp 97.2°F

## 2011-03-09 DIAGNOSIS — K430 Incisional hernia with obstruction, without gangrene: Secondary | ICD-10-CM

## 2011-03-09 NOTE — Patient Instructions (Signed)
  COCOA BUTTER & VITAMIN E CREAM  ([employer] or other brand)  Apply cocoa butter/vitamin E cream to your incision 2 - 3 times daily.  Massage cream into incision for one minute with each application.  Use sunscreen (50 SPF or higher) for first 6 months after surgery.  You may substitute Mederma or other scar reducing creams as desired.   

## 2011-03-09 NOTE — Progress Notes (Signed)
Visit Diagnoses: 1. Incisional hernia, incarcerated     HISTORY: Patient underwent incarcerated incisional hernia repair with mesh in late June, 2012. Patient returns today for final postoperative visit.   EXAM: Transverse wound just below the umbilicus is well healed. No drainage. No sign of seroma. Minimal tenderness. With Valsalva there is no sign of recurrence.   IMPRESSION: Incarcerated incisional hernia, repaired with mesh, no sign of complication, no sign of recurrence.   PLAN: Patient is released to full activity without restriction. She'll return to see me as needed.   Velora Heckler, MD, FACS General & Endocrine Surgery [facility]

## 2011-03-22 ENCOUNTER — Ambulatory Visit (INDEPENDENT_AMBULATORY_CARE_PROVIDER_SITE_OTHER): Payer: Medicaid Other | Admitting: Family Medicine

## 2011-03-22 ENCOUNTER — Other Ambulatory Visit: Payer: Self-pay | Admitting: Family Medicine

## 2011-03-22 ENCOUNTER — Encounter: Payer: Self-pay | Admitting: Family Medicine

## 2011-03-22 DIAGNOSIS — M25562 Pain in left knee: Secondary | ICD-10-CM

## 2011-03-22 DIAGNOSIS — M79609 Pain in unspecified limb: Secondary | ICD-10-CM

## 2011-03-22 DIAGNOSIS — M25569 Pain in unspecified knee: Secondary | ICD-10-CM

## 2011-03-22 DIAGNOSIS — M79604 Pain in right leg: Secondary | ICD-10-CM

## 2011-03-22 LAB — MAGNESIUM: Magnesium: 2.2 mg/dL (ref 1.5–2.5)

## 2011-03-22 NOTE — Patient Instructions (Signed)
Your symptoms are not indicative of anything bad but they also don't fit into a specific medical diagnosis. We will recheck some labs on you (calcium, magnesium, a1c for diabetes) for conditions that can cause numbness, cramping. Vitamin B6 100mg  daily can help with nerve symptoms. Typically heat is better for muscle cramps than ice but ok to use whatever feels better for you. Pickle juice, mustard, beef broth, gatorade are all considerations to help with spasms as well. Make sure you are drinking plenty of water - 6 glasses of water a day at least - people get muscle spasms more when they are dehydrated. I don't believe any medicines will help you with this - you can try the flexeril but by the time it would take effect your symptoms are usually gone.

## 2011-03-23 LAB — HEMOGLOBIN A1C
Hgb A1c MFr Bld: 5.7 % — ABNORMAL HIGH (ref <5.7)
Mean Plasma Glucose: 117 mg/dL — ABNORMAL HIGH (ref <117)

## 2011-03-23 LAB — BASIC METABOLIC PANEL
BUN: 20 mg/dL (ref 6–23)
CO2: 26 mEq/L (ref 19–32)
Calcium: 10.1 mg/dL (ref 8.4–10.5)
Chloride: 95 mEq/L — ABNORMAL LOW (ref 96–112)
Creat: 0.79 mg/dL (ref 0.50–1.10)
Glucose, Bld: 91 mg/dL (ref 70–99)
Potassium: 3.7 mEq/L (ref 3.5–5.3)
Sodium: 136 mEq/L (ref 135–145)

## 2011-03-24 ENCOUNTER — Encounter: Payer: Self-pay | Admitting: Family Medicine

## 2011-03-24 DIAGNOSIS — M79604 Pain in right leg: Secondary | ICD-10-CM | POA: Insufficient documentation

## 2011-03-24 NOTE — Assessment & Plan Note (Signed)
Bilateral leg cramps and numbness - not in a specific distribution, negative tarsal tunnel tinels.  Cramps are only 1-2 times a week and resolve quickly.  Discussed proper hydration, things she can do when cramps come on (flexeril, massage, pickle juice, gatorade, other salty foods, fluids).  Will recheck magnesium, bmp, and check a1c for changes in mag, calcium, or blood sugars that could cause stocking pattern of numbness (B12 was normal with a normal MCV when recently checked).  Will call patient with results.  Otherwise reassured.

## 2011-03-24 NOTE — Assessment & Plan Note (Signed)
2/2 end stage DJD - has done multiple injections with only about 1 week of relief.  Has done viscosupplementation, PT as well without much help.  Will refer to ortho for discussion regarding total knee replacement.

## 2011-03-24 NOTE — Progress Notes (Signed)
Subjective:    Patient ID: [name], female    DOB: 05-Sep-1955, 55 y.o.   MRN: [id number]  PCP: Saralyn Pilar  Leg Pain   Knee Pain    55 yo F here with known end stage left knee DJD returns for bilateral calf spasms and feet pain  6/21: Patient reports > 2 years of left knee pain - believes it started about when she had fallen down directly onto her left knee. Since then has had diffuse pain within left knee, swelling, some bruising. Had x-rays showing advanced DJD in all compartments back in 2009. She reports having had an MRI remotely but we have no report of this. She has tried different OTC meds, vicodin, cortisone shot (1 1/2 years ago - only 3 days of relief with this). Did home exercises as well though pain limited how much she could do. She reports that it catches, locks up, gives out but not in one specific location. Applied for medicaid but was denied - was going to try to get in with a surgeon to discuss possible knee replacement.  7/11: Patient reports pain still a 7/10 despite cortisone injection 3 weeks ago - only helped for a few days and did not last. Taking narcotic pain medication. Pain starting to radiate into calf and up to hip. Massage mild help. Difficult to bear weight on left leg due to knee pain.  7/31: Patient reports pain has become severe now in right leg, mostly posterior and medial. Reports when going to get up she felt a pull/pop in this area. No bruising but feels more swollen and now with pain across anterior knee. No prior problems with this knee. Has known end stage DJD in left knee - awaiting medicaid to go through now so she has some help in coverage for total knee replacement. Walks with a walker around house but very difficult with her knee pain bilaterally.    8/24: Patient still reports medicaid has not gone through. Pain in right knee continues.  Has been icing and now has a wheelchair to help her get around house. States she  is doing hamstring curls. Pain mostly anteromedial right knee now. Taking ibuprofen and vicodin for pain. Has been started on lasix for lower extremity edema.  9/11: Patient reports her medicaid has gone through so can now be referred for discussion of knee replacement for left knee. States over weekend she developed numbness and burning in bilateral feet and heels, not over dorsal feet or medial feet. This is intermittent. No history of diabetes, problems with calcium or magnesium.  Had a normal B12 level in past 3 months as well (labs in chart).  No A1c available.  Did have a calcium of 10.5 but all others had been normal. Reports having calf spasms that last 2-3 minutes then go away.  These have been occurring for 2-3 weeks only intermittently (1-2 times a week).  Past Medical History  Diagnosis Date  . Hypertension   . Hyperlipidemia   . Asthma   . Obesity   . Lower extremity edema     Chronic. 2D echo (2005) - EF 65%.  . Seasonal allergies   . Abnormal EKG 06/2003    History of inverted T waves V1-V3. Normal 2D echo (07/23/2003): LVEF 65%.  . History of multiple pulmonary nodules     Incidental finding: CT Abd/ Pelvis (04/2010) - Several small lower lobe lung nodules, including one pure ground-glass pulmonary nodule measuring 8 mm in the left lower  lobe.  Recommend follow-up chest CT (IV contrast preferred) in 6 months to document stability. //  CT Abd/ Pelvis (07/2010) -  3 mm RLL and  8 mm LLL nodule stable.  Other nodules unchanged, likely benign.  . 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 Followed by Sports Medicine, Dr. Jennette Kettle.  . Incarcerated ventral hernia 04/2010    Noted on CT Abd/ pelvis (04/2010). Patient now s/p ventral hernia repair by Dr. Gerrit Friends (12/2010)  . Anemia     BL Hgb 11-12. Ferritin 14 - low normal (08/2007). Colonoscopy 2009 - external hemorrhoids (excellent prep). Last anemia panel (12/2010) - Iron  24, TIBC 269,  B12  316, Folate 11.9,  Ferritin 73.  . Night sweats   . Dizziness   . Fatigue     loss of sleep  . Wears dentures   . Wears glasses   . Nausea & vomiting     Current Outpatient Prescriptions on File Prior to Visit  Medication Sig Dispense Refill  . albuterol (PROVENTIL HFA;VENTOLIN HFA) 108 (90 BASE) MCG/ACT inhaler Inhale 2 puffs into the lungs every 6 (six) hours as needed for wheezing or shortness of breath.  1 Inhaler  5  . cyclobenzaprine (FLEXERIL) 10 MG tablet Take 1 tablet (10 mg total) by mouth 2 (two) times daily as needed. For pain  30 tablet  0  . docusate sodium (COLACE) 100 MG capsule Take 1 capsule (100 mg total) by mouth 2 (two) times daily.  60 capsule  3  . Fluticasone-Salmeterol (ADVAIR HFA IN) Inhale into the lungs. Confirm type and dosage with patient.       . furosemide (LASIX) 40 MG tablet Take 0.5 tablets (20 mg total) by mouth daily.  30 tablet  3  . ibuprofen (ADVIL,MOTRIN) 800 MG tablet Take 800 mg by mouth at bedtime as needed.        [name] lisinopril-hydrochlorothiazide (PRINZIDE,ZESTORETIC) 20-25 MG per tablet Take 1 tablet by mouth daily.        . Oxycodone-Acetaminophen (PERCOCET PO) Take by mouth as needed. Confirm dosage with patient.         Past Surgical History  Procedure Date  . Incision and drainage abscess anal 07/2008    I&D and debridgement of anorectal abscess  . Inguinal hernia repair   . Tubal ligation   . Incisional hernia repair 12/2010    Repair of incarcerated ventral incisional hernia with Ethicon mesh patch - performed by Dr. Gerrit Friends.   . Foot surgery 2004     left    Allergies  Allergen Reactions  . Toradol Shortness Of Breath and Palpitations  . Latex Other (See Comments)    Powdered. Confirm type of reaction with patient.    History   Social History  . Marital Status: Married    Spouse Name: N/A    Number of Children: N/A  . Years of Education: N/A   Occupational History  . Not on file.   Social History Main Topics  . Smoking status:  Former Smoker    Types: Cigarettes    Quit date: 09/10/1998  . Smokeless tobacco: Not on file  . Alcohol Use: No  . Drug Use: No  . Sexually Active: Yes -- Female partner(s)   Other Topics Concern  . Not on file   Social History Narrative  . No narrative on file    Family History  Problem Relation Age of Onset  . Diabetes Mother   . Hypertension  Mother   . Stroke Mother   . Heart attack Neg Hx   . Dementia Father     BP 134/85  Pulse 79  Temp(Src) 98.1 F (36.7 C) (Oral)  Ht 5\' 1"  (1.549 m)  Wt 326 lb (147.873 kg)  BMI 61.60 kg/m2  LMP 09/09/2009  Review of Systems  See HPI above.    Objective:   Physical Exam  Gen: NAD, obese L knee: No gross deformity, ecchymoses, effusion. Diffuse anterior TTP including joint lines, pes insertion. Negative ant/post drawers. Negative valgus/varus testing. Negative lachmanns. Negative mcmurrays, apleys, patellar apprehension, clarkes.  Has pain with mcmurrays and apleys but is diffuse and no click. NV intact distally.  Bilateral feet: No gross deformity, swelling, bruising, redness. Diffuse TTP throughout foot and lower leg. FROM ankle and toes. Negative tinels over tarsal tunnels. No change in sensation currently. Negative ant drawers and talar tilts. Negative thompsons. 2+ dp pulses.    Assessment & Plan:  1. Bilateral leg cramps and numbness - not in a specific distribution, negative tarsal tunnel tinels.  Cramps are only 1-2 times a week and resolve quickly.  Discussed proper hydration, things she can do when cramps come on (flexeril, massage, pickle juice, gatorade, other salty foods, fluids).  Will recheck magnesium, bmp, and check a1c for changes in mag, calcium, or blood sugars that could cause stocking pattern of numbness (B12 was normal with a normal MCV when recently checked).  Will call patient with results.  Otherwise reassured.  2. Left knee pain - 2/2 end stage DJD - has done multiple injections with only  about 1 week of relief.  Has done viscosupplementation, PT as well without much help.  Will refer to ortho for discussion regarding total knee replacement.

## 2011-03-31 ENCOUNTER — Other Ambulatory Visit: Payer: Medicaid Other

## 2011-03-31 DIAGNOSIS — Z1211 Encounter for screening for malignant neoplasm of colon: Secondary | ICD-10-CM

## 2011-03-31 LAB — POC HEMOCCULT BLD/STL (HOME/3-CARD/SCREEN)
Card #2 Fecal Occult Blod, POC: NEGATIVE
Card #3 Fecal Occult Blood, POC: NEGATIVE
Fecal Occult Blood, POC: NEGATIVE

## 2011-04-07 LAB — URINE MICROSCOPIC-ADD ON

## 2011-04-07 LAB — COMPREHENSIVE METABOLIC PANEL
ALT: <8
AST: 23
Albumin: 3.5
Alkaline Phosphatase: 85
BUN: 8
CO2: 22
Calcium: 9
Chloride: 105
Creatinine, Ser: 0.9
GFR calc Af Amer: >60
GFR calc non Af Amer: >60
Glucose, Bld: 102 — ABNORMAL HIGH
Potassium: 3.2 — ABNORMAL LOW
Sodium: 139
Total Bilirubin: 0.5
Total Protein: 6.9

## 2011-04-07 LAB — BASIC METABOLIC PANEL
BUN: 7
CO2: 23
Calcium: 9.5
Chloride: 107
Creatinine, Ser: 0.7
GFR calc Af Amer: >60
GFR calc non Af Amer: >60
Glucose, Bld: 128 — ABNORMAL HIGH
Potassium: 4.2
Sodium: 140

## 2011-04-07 LAB — DIFFERENTIAL
Basophils Absolute: 0.1
Basophils Relative: 2 — ABNORMAL HIGH
Eosinophils Absolute: 0
Eosinophils Relative: 0
Lymphocytes Relative: 37
Lymphs Abs: 1.6
Monocytes Absolute: 0.6
Monocytes Relative: 13 — ABNORMAL HIGH
Neutro Abs: 2.1
Neutrophils Relative %: 48

## 2011-04-07 LAB — CULTURE, BLOOD (ROUTINE X 2): Culture: NO GROWTH

## 2011-04-07 LAB — CBC
HCT: 35.2 — ABNORMAL LOW
HCT: 36.2
Hemoglobin: 11.9 — ABNORMAL LOW
Hemoglobin: 11.9 — ABNORMAL LOW
MCHC: 33
MCHC: 33.9
MCV: 86
MCV: 87
Platelets: 266
Platelets: 307
RBC: 4.09
RBC: 4.16
RDW: 14.6
RDW: 14.6
WBC: 3.7 — ABNORMAL LOW
WBC: 4.4

## 2011-04-07 LAB — URINALYSIS, ROUTINE W REFLEX MICROSCOPIC
Bilirubin Urine: NEGATIVE
Glucose, UA: NEGATIVE
Hgb urine dipstick: NEGATIVE
Ketones, ur: 15 — AB
Nitrite: NEGATIVE
Protein, ur: NEGATIVE
Specific Gravity, Urine: 1.014
Urobilinogen, UA: 0.2
pH: 5.5

## 2011-04-07 LAB — URINE CULTURE: Colony Count: 6000

## 2011-04-07 LAB — CK TOTAL AND CKMB (NOT AT ARMC)
CK, MB: 1.2
Relative Index: 1.1
Total CK: 110

## 2011-04-07 LAB — CARDIAC PANEL(CRET KIN+CKTOT+MB+TROPI)
CK, MB: 1.5
CK, MB: 1.8
Relative Index: 1.3
Relative Index: 1.6
Total CK: 115
Total CK: 115
Troponin I: 0.01
Troponin I: 0.02

## 2011-04-07 LAB — H1N1 SCREEN (PCR): H1N1 Virus Scrn: DETECTED

## 2011-04-07 LAB — INFLUENZA A & B ANTIBODIES
Influenza A Virus Ab, IgG: 0.68 IV
Influenza A Virus Ab, IgM: 0 IV
Influenza B Virus Ab, IgM: 0.07 IV
Influenza B ab, IgG: 0.77 IV

## 2011-04-07 LAB — TROPONIN I: Troponin I: 0.02

## 2011-04-25 ENCOUNTER — Other Ambulatory Visit: Payer: Self-pay | Admitting: Internal Medicine

## 2011-04-25 DIAGNOSIS — M1710 Unilateral primary osteoarthritis, unspecified knee: Secondary | ICD-10-CM

## 2011-05-04 NOTE — Progress Notes (Signed)
Addended by: Bufford Spikes on: 05/04/2011 10:46 AM   Modules accepted: Orders

## 2011-05-11 ENCOUNTER — Other Ambulatory Visit: Payer: Self-pay | Admitting: Orthopedic Surgery

## 2011-05-11 ENCOUNTER — Ambulatory Visit
Admission: RE | Admit: 2011-05-11 | Discharge: 2011-05-11 | Disposition: A | Discharge status: Home / Self Care | Payer: Medicaid Other | Source: Ambulatory Visit | Attending: Orthopedic Surgery | Admitting: Orthopedic Surgery

## 2011-05-11 DIAGNOSIS — R52 Pain, unspecified: Secondary | ICD-10-CM

## 2011-06-01 ENCOUNTER — Encounter (HOSPITAL_COMMUNITY): Payer: Self-pay | Admitting: Pharmacy Technician

## 2011-06-10 ENCOUNTER — Encounter (HOSPITAL_COMMUNITY)
Admission: RE | Admit: 2011-06-10 | Discharge: 2011-06-10 | Disposition: A | Discharge status: Home / Self Care | Payer: Medicaid Other | Source: Ambulatory Visit | Attending: Orthopedic Surgery | Admitting: Orthopedic Surgery

## 2011-06-10 ENCOUNTER — Encounter (HOSPITAL_COMMUNITY): Payer: Self-pay

## 2011-06-10 LAB — COMPREHENSIVE METABOLIC PANEL
ALT: 9 U/L (ref 0–35)
AST: 13 U/L (ref 0–37)
Albumin: 3.8 g/dL (ref 3.5–5.2)
Alkaline Phosphatase: 131 U/L — ABNORMAL HIGH (ref 39–117)
BUN: 17 mg/dL (ref 6–23)
CO2: 28 mEq/L (ref 19–32)
Calcium: 10.2 mg/dL (ref 8.4–10.5)
Chloride: 102 mEq/L (ref 96–112)
Creatinine, Ser: 0.69 mg/dL (ref 0.50–1.10)
GFR calc Af Amer: >90 mL/min (ref >90)
GFR calc non Af Amer: >90 mL/min (ref >90)
Glucose, Bld: 98 mg/dL (ref 70–99)
Potassium: 3.9 mEq/L (ref 3.5–5.1)
Sodium: 139 mEq/L (ref 135–145)
Total Bilirubin: 0.3 mg/dL (ref 0.3–1.2)
Total Protein: 7.5 g/dL (ref 6.0–8.3)

## 2011-06-10 LAB — URINALYSIS, ROUTINE W REFLEX MICROSCOPIC
Glucose, UA: NEGATIVE mg/dL
Hgb urine dipstick: NEGATIVE
Ketones, ur: NEGATIVE mg/dL
Leukocytes, UA: NEGATIVE
Nitrite: NEGATIVE
Protein, ur: NEGATIVE mg/dL
Specific Gravity, Urine: 1.007 (ref 1.005–1.030)
Urobilinogen, UA: 0.2 mg/dL (ref 0.0–1.0)
pH: 7 (ref 5.0–8.0)

## 2011-06-10 LAB — CBC
HCT: 39.8 % (ref 36.0–46.0)
Hemoglobin: 13 g/dL (ref 12.0–15.0)
MCH: 28.6 pg (ref 26.0–34.0)
MCHC: 32.7 g/dL (ref 30.0–36.0)
MCV: 87.7 fL (ref 78.0–100.0)
Platelets: 385 10*3/uL (ref 150–400)
RBC: 4.54 MIL/uL (ref 3.87–5.11)
RDW: 13.7 % (ref 11.5–15.5)
WBC: 5.1 10*3/uL (ref 4.0–10.5)

## 2011-06-10 LAB — PROTIME-INR
INR: 1.02 (ref 0.00–1.49)
Prothrombin Time: 13.6 seconds (ref 11.6–15.2)

## 2011-06-10 LAB — APTT: aPTT: 33 seconds (ref 24–37)

## 2011-06-10 LAB — SURGICAL PCR SCREEN
MRSA, PCR: NEGATIVE
Staphylococcus aureus: NEGATIVE

## 2011-06-10 LAB — ABO/RH: ABO/RH(D): O POS

## 2011-06-10 NOTE — Pre-Procedure Instructions (Signed)
20 [name]  06/10/2011   Your procedure is scheduled on:  June 16, 2011  Report to [facility] at 0530 AM.  Call this number if you have problems the morning of surgery: [phone]   Remember:   Do not eat food:After Midnight.  May have clear liquids: up to 4 Hours before arrival.  Clear liquids include soda, tea, black coffee, apple or grape juice, broth.  Take these medicines the morning of surgery with A SIP OF WATER: Albuterol, Oxycodone   Do not wear jewelry, make-up or nail polish.  Do not wear lotions, powders, or perfumes. You may wear deodorant.  Do not shave 48 hours prior to surgery.  Do not bring valuables to the hospital.  Contacts, dentures or bridgework may not be worn into surgery.  Leave suitcase in the car. After surgery it may be brought to your room.  For patients admitted to the hospital, checkout time is 11:00 AM the day of discharge.     Special Instructions: Incentive Spirometry - Practice and bring it with you on the day of surgery. and CHG Shower Use Special Wash: 1/2 bottle night before surgery and 1/2 bottle morning of surgery.   Please read over the following fact sheets that you were given: Pain Booklet, Coughing and Deep Breathing, Blood Transfusion Information, Total Joint Packet and Surgical Site Infection Prevention

## 2011-06-13 NOTE — Telephone Encounter (Signed)
See incoming notes

## 2011-06-15 ENCOUNTER — Other Ambulatory Visit: Payer: Self-pay | Admitting: Orthopedic Surgery

## 2011-06-15 MED ORDER — CEFAZOLIN SODIUM-DEXTROSE 2-3 GM-% IV SOLR
2.0000 g | INTRAVENOUS | Status: AC
  Administered 2011-06-16: 2 g via INTRAVENOUS
  Filled 2011-06-15: qty 50

## 2011-06-15 NOTE — Progress Notes (Signed)
H&P DICTATED FOR 06/16/2011 ADMISSION REPORT #[id number] FOR TOTAL KNEE ARTHROPLASTY.

## 2011-06-16 ENCOUNTER — Inpatient Hospital Stay (HOSPITAL_COMMUNITY)
Admission: RE | Admit: 2011-06-16 | Discharge: 2011-06-22 | DRG: 470 | Disposition: A | Discharge status: Home Health | Payer: Medicaid Other | Source: Ambulatory Visit | Attending: Orthopedic Surgery | Admitting: Orthopedic Surgery

## 2011-06-16 ENCOUNTER — Encounter (HOSPITAL_COMMUNITY): Payer: Self-pay | Admitting: *Deleted

## 2011-06-16 ENCOUNTER — Encounter (HOSPITAL_COMMUNITY)
Admission: RE | Disposition: A | Discharge status: Home Health | Payer: Self-pay | Source: Ambulatory Visit | Attending: Orthopedic Surgery

## 2011-06-16 ENCOUNTER — Encounter (HOSPITAL_COMMUNITY): Payer: Self-pay | Admitting: Anesthesiology

## 2011-06-16 ENCOUNTER — Inpatient Hospital Stay (HOSPITAL_COMMUNITY): Payer: Medicaid Other | Admitting: Anesthesiology

## 2011-06-16 DIAGNOSIS — M1710 Unilateral primary osteoarthritis, unspecified knee: Principal | ICD-10-CM | POA: Diagnosis present

## 2011-06-16 DIAGNOSIS — I10 Essential (primary) hypertension: Secondary | ICD-10-CM | POA: Diagnosis present

## 2011-06-16 DIAGNOSIS — J45909 Unspecified asthma, uncomplicated: Secondary | ICD-10-CM | POA: Diagnosis present

## 2011-06-16 DIAGNOSIS — E785 Hyperlipidemia, unspecified: Secondary | ICD-10-CM | POA: Diagnosis present

## 2011-06-16 DIAGNOSIS — Z01812 Encounter for preprocedural laboratory examination: Secondary | ICD-10-CM

## 2011-06-16 DIAGNOSIS — D62 Acute posthemorrhagic anemia: Secondary | ICD-10-CM | POA: Diagnosis not present

## 2011-06-16 HISTORY — PX: TOTAL KNEE ARTHROPLASTY: SHX125

## 2011-06-16 SURGERY — ARTHROPLASTY, KNEE, TOTAL
Anesthesia: General | Site: Knee | Laterality: Left | Wound class: Clean

## 2011-06-16 MED ORDER — LISINOPRIL-HYDROCHLOROTHIAZIDE 20-25 MG PO TABS: 1.0000 | ORAL_TABLET | Freq: Every day | ORAL | Status: DC

## 2011-06-16 MED ORDER — HYDROMORPHONE HCL PF 1 MG/ML IJ SOLN
0.2500 mg | INTRAMUSCULAR | Status: DC | PRN
  Administered 2011-06-16 (×4): 0.5 mg via INTRAVENOUS

## 2011-06-16 MED ORDER — HYDROCHLOROTHIAZIDE 25 MG PO TABS
25.0000 mg | ORAL_TABLET | Freq: Every day | ORAL | Status: DC
  Administered 2011-06-16 – 2011-06-22 (×6): 25 mg via ORAL
  Filled 2011-06-16 (×7): qty 1

## 2011-06-16 MED ORDER — FENTANYL CITRATE 0.05 MG/ML IJ SOLN
INTRAMUSCULAR | Status: DC | PRN
  Administered 2011-06-16 (×4): 50 ug via INTRAVENOUS
  Administered 2011-06-16: 100 ug via INTRAVENOUS
  Administered 2011-06-16 (×5): 50 ug via INTRAVENOUS
  Administered 2011-06-16: 100 ug via INTRAVENOUS
  Administered 2011-06-16 (×2): 50 ug via INTRAVENOUS

## 2011-06-16 MED ORDER — METOCLOPRAMIDE HCL 10 MG PO TABS: 5.0000 mg | ORAL_TABLET | Freq: Three times a day (TID) | ORAL | Status: DC | PRN

## 2011-06-16 MED ORDER — DOCUSATE SODIUM 100 MG PO CAPS
100.0000 mg | ORAL_CAPSULE | Freq: Two times a day (BID) | ORAL | Status: DC
  Administered 2011-06-16 – 2011-06-22 (×12): 100 mg via ORAL
  Filled 2011-06-16 (×14): qty 1

## 2011-06-16 MED ORDER — SODIUM CHLORIDE 0.9 % IR SOLN
Status: DC | PRN
  Administered 2011-06-16: 3000 mL

## 2011-06-16 MED ORDER — ONDANSETRON HCL 4 MG/2ML IJ SOLN
INTRAMUSCULAR | Status: DC | PRN
  Administered 2011-06-16: 4 mg via INTRAVENOUS

## 2011-06-16 MED ORDER — SODIUM CHLORIDE 0.9 % IR SOLN
Status: DC | PRN
  Administered 2011-06-16: 1000 mL

## 2011-06-16 MED ORDER — MIDAZOLAM HCL 5 MG/5ML IJ SOLN
INTRAMUSCULAR | Status: DC | PRN
  Administered 2011-06-16: 2 mg via INTRAVENOUS

## 2011-06-16 MED ORDER — ALBUTEROL SULFATE HFA 108 (90 BASE) MCG/ACT IN AERS
2.0000 | INHALATION_SPRAY | Freq: Four times a day (QID) | RESPIRATORY_TRACT | Status: DC | PRN
  Filled 2011-06-16: qty 6.7

## 2011-06-16 MED ORDER — LISINOPRIL 20 MG PO TABS
20.0000 mg | ORAL_TABLET | Freq: Every day | ORAL | Status: DC
  Administered 2011-06-16 – 2011-06-22 (×5): 20 mg via ORAL
  Filled 2011-06-16 (×7): qty 1

## 2011-06-16 MED ORDER — BUPIVACAINE-EPINEPHRINE PF 0.5-1:200000 % IJ SOLN
INTRAMUSCULAR | Status: DC | PRN
  Administered 2011-06-16: 30 mL

## 2011-06-16 MED ORDER — NALOXONE HCL 0.4 MG/ML IJ SOLN: 0.4000 mg | INTRAMUSCULAR | Status: DC | PRN

## 2011-06-16 MED ORDER — CHLORHEXIDINE GLUCONATE 4 % EX LIQD: 60.0000 mL | Freq: Once | CUTANEOUS | Status: DC

## 2011-06-16 MED ORDER — GLYCOPYRROLATE 0.2 MG/ML IJ SOLN
INTRAMUSCULAR | Status: DC | PRN
  Administered 2011-06-16: .4 mg via INTRAVENOUS

## 2011-06-16 MED ORDER — ONDANSETRON HCL 4 MG/2ML IJ SOLN: 4.0000 mg | Freq: Four times a day (QID) | INTRAMUSCULAR | Status: DC | PRN

## 2011-06-16 MED ORDER — NEOSTIGMINE METHYLSULFATE 1 MG/ML IJ SOLN
INTRAMUSCULAR | Status: DC | PRN
  Administered 2011-06-16: 3 mg via INTRAVENOUS

## 2011-06-16 MED ORDER — ONDANSETRON HCL 4 MG PO TABS: 4.0000 mg | ORAL_TABLET | Freq: Four times a day (QID) | ORAL | Status: DC | PRN

## 2011-06-16 MED ORDER — ACETAMINOPHEN 10 MG/ML IV SOLN
INTRAVENOUS | Status: AC
  Filled 2011-06-16: qty 100

## 2011-06-16 MED ORDER — HYDROMORPHONE HCL PF 1 MG/ML IJ SOLN
INTRAMUSCULAR | Status: AC
  Filled 2011-06-16: qty 1

## 2011-06-16 MED ORDER — METOCLOPRAMIDE HCL 5 MG/ML IJ SOLN
5.0000 mg | Freq: Three times a day (TID) | INTRAMUSCULAR | Status: DC | PRN
  Filled 2011-06-16: qty 2

## 2011-06-16 MED ORDER — DIPHENHYDRAMINE HCL 50 MG/ML IJ SOLN: 12.5000 mg | Freq: Four times a day (QID) | INTRAMUSCULAR | Status: DC | PRN

## 2011-06-16 MED ORDER — LACTATED RINGERS IV SOLN
INTRAVENOUS | Status: DC | PRN
  Administered 2011-06-16 (×3): via INTRAVENOUS

## 2011-06-16 MED ORDER — FUROSEMIDE 20 MG PO TABS
20.0000 mg | ORAL_TABLET | Freq: Every day | ORAL | Status: DC
  Administered 2011-06-16 – 2011-06-22 (×5): 20 mg via ORAL
  Filled 2011-06-16 (×7): qty 1

## 2011-06-16 MED ORDER — SODIUM CHLORIDE 0.9 % IJ SOLN: 9.0000 mL | INTRAMUSCULAR | Status: DC | PRN

## 2011-06-16 MED ORDER — DIPHENHYDRAMINE HCL 12.5 MG/5ML PO ELIX
12.5000 mg | ORAL_SOLUTION | Freq: Four times a day (QID) | ORAL | Status: DC | PRN
  Filled 2011-06-16: qty 5

## 2011-06-16 MED ORDER — HETASTARCH-ELECTROLYTES 6 % IV SOLN
INTRAVENOUS | Status: DC | PRN
  Administered 2011-06-16: 09:00:00 via INTRAVENOUS

## 2011-06-16 MED ORDER — HYDROMORPHONE 0.3 MG/ML IV SOLN
INTRAVENOUS | Status: DC
  Administered 2011-06-16: 1.99 mg via INTRAVENOUS
  Administered 2011-06-16: 13:00:00 via INTRAVENOUS
  Administered 2011-06-17: 0.799 mg via INTRAVENOUS
  Administered 2011-06-17: 12:00:00 via INTRAVENOUS
  Administered 2011-06-17: 0.2 mg via INTRAVENOUS
  Administered 2011-06-17: 1.79 mg via INTRAVENOUS
  Administered 2011-06-17: 0.599 mg via INTRAVENOUS
  Administered 2011-06-18: 0.3 mg via INTRAVENOUS
  Administered 2011-06-18: 0.2 mg via INTRAVENOUS
  Filled 2011-06-16: qty 25

## 2011-06-16 MED ORDER — ACETAMINOPHEN 325 MG PO TABS
650.0000 mg | ORAL_TABLET | Freq: Four times a day (QID) | ORAL | Status: DC | PRN
  Administered 2011-06-19: 650 mg via ORAL
  Filled 2011-06-16: qty 2

## 2011-06-16 MED ORDER — PROPOFOL 10 MG/ML IV EMUL
INTRAVENOUS | Status: DC | PRN
  Administered 2011-06-16: 200 mg via INTRAVENOUS

## 2011-06-16 MED ORDER — ROCURONIUM BROMIDE 100 MG/10ML IV SOLN
INTRAVENOUS | Status: DC | PRN
  Administered 2011-06-16: 50 mg via INTRAVENOUS

## 2011-06-16 MED ORDER — MENTHOL 3 MG MT LOZG: 1.0000 | LOZENGE | OROMUCOSAL | Status: DC | PRN

## 2011-06-16 MED ORDER — PHENOL 1.4 % MT LIQD: 1.0000 | OROMUCOSAL | Status: DC | PRN

## 2011-06-16 MED ORDER — ACETAMINOPHEN 650 MG RE SUPP: 650.0000 mg | Freq: Four times a day (QID) | RECTAL | Status: DC | PRN

## 2011-06-16 MED ORDER — LABETALOL HCL 5 MG/ML IV SOLN
INTRAVENOUS | Status: DC | PRN
  Administered 2011-06-16: 5 mg via INTRAVENOUS

## 2011-06-16 MED ORDER — ACETAMINOPHEN 10 MG/ML IV SOLN
INTRAVENOUS | Status: DC | PRN
  Administered 2011-06-16: 1000 mg via INTRAVENOUS

## 2011-06-16 SURGICAL SUPPLY — 57 items
BANDAGE ELASTIC 4 VELCRO ST LF (GAUZE/BANDAGES/DRESSINGS) ×2 IMPLANT
BANDAGE ELASTIC 6 VELCRO ST LF (GAUZE/BANDAGES/DRESSINGS) ×1 IMPLANT
BANDAGE ESMARK 6X9 LF (GAUZE/BANDAGES/DRESSINGS) ×1 IMPLANT
BANDAGE GAUZE ELAST BULKY 4 IN (GAUZE/BANDAGES/DRESSINGS) ×2 IMPLANT
BLADE SAGITTAL 25.0X1.27X90 (BLADE) ×2 IMPLANT
BLADE SURG 10 STRL SS (BLADE) ×2 IMPLANT
BNDG CMPR 9X6 STRL LF SNTH (GAUZE/BANDAGES/DRESSINGS) ×2
BNDG CMPR MED 10X6 ELC LF (GAUZE/BANDAGES/DRESSINGS) ×1
BNDG ELASTIC 6X10 VLCR STRL LF (GAUZE/BANDAGES/DRESSINGS) ×2 IMPLANT
BNDG ESMARK 6X9 LF (GAUZE/BANDAGES/DRESSINGS) ×4
BONE CEMENT PALACOSE (Orthopedic Implant) ×4 IMPLANT
BOWL SMART MIX CTS (DISPOSABLE) ×2 IMPLANT
CATH FOLEY LATEX FREE 14FR (CATHETERS) ×2
CATH FOLEY LF 14FR (CATHETERS) IMPLANT
CEMENT BONE PALACOSE (Orthopedic Implant) ×2 IMPLANT
CLOTH BEACON ORANGE TIMEOUT ST (SAFETY) ×2 IMPLANT
COVER SURGICAL LIGHT HANDLE (MISCELLANEOUS) ×4 IMPLANT
CUFF TOURNIQUET SINGLE 34IN LL (TOURNIQUET CUFF) IMPLANT
CUFF TOURNIQUET SINGLE 44IN (TOURNIQUET CUFF) ×1 IMPLANT
DRAPE EXTREMITY T 121X128X90 (DRAPE) ×2 IMPLANT
DRAPE PROXIMA HALF (DRAPES) ×2 IMPLANT
DRAPE U-SHAPE 47X51 STRL (DRAPES) ×2 IMPLANT
DRSG ADAPTIC 3X8 NADH LF (GAUZE/BANDAGES/DRESSINGS) ×2 IMPLANT
DRSG PAD ABDOMINAL 8X10 ST (GAUZE/BANDAGES/DRESSINGS) ×3 IMPLANT
DURAPREP 26ML APPLICATOR (WOUND CARE) ×2 IMPLANT
ELECT REM PT RETURN 9FT ADLT (ELECTROSURGICAL) ×2
ELECTRODE REM PT RTRN 9FT ADLT (ELECTROSURGICAL) ×1 IMPLANT
EVACUATOR 3/16  PVC DRAIN (DRAIN)
EVACUATOR 3/16 PVC DRAIN (DRAIN) IMPLANT
FACESHIELD LNG OPTICON STERILE (SAFETY) ×2 IMPLANT
GLOVE SS PI 9.0 STRL (GLOVE) ×4 IMPLANT
GOWN PREVENTION PLUS XLARGE (GOWN DISPOSABLE) ×5 IMPLANT
GOWN STRL NON-REIN LRG LVL3 (GOWN DISPOSABLE) ×3 IMPLANT
HANDPIECE INTERPULSE COAX TIP (DISPOSABLE) ×2
IMMOBILIZER KNEE 20 (SOFTGOODS)
IMMOBILIZER KNEE 20 THIGH 36 (SOFTGOODS) IMPLANT
IMMOBILIZER KNEE 22  40 CIR (ORTHOPEDIC SUPPLIES) ×1
IMMOBILIZER KNEE 22 40 CIR (ORTHOPEDIC SUPPLIES) IMPLANT
IMMOBILIZER KNEE 22 UNIV (SOFTGOODS) ×1 IMPLANT
IMMOBILIZER KNEE 24 THIGH 36 (MISCELLANEOUS) IMPLANT
IMMOBILIZER KNEE 24 UNIV (MISCELLANEOUS)
KIT BASIN OR (CUSTOM PROCEDURE TRAY) ×2 IMPLANT
KIT ROOM TURNOVER OR (KITS) ×2 IMPLANT
MANIFOLD NEPTUNE II (INSTRUMENTS) ×2 IMPLANT
NS IRRIG 1000ML POUR BTL (IV SOLUTION) ×2 IMPLANT
PACK TOTAL JOINT (CUSTOM PROCEDURE TRAY) ×2 IMPLANT
PAD ARMBOARD 7.5X6 YLW CONV (MISCELLANEOUS) ×4 IMPLANT
SET HNDPC FAN SPRY TIP SCT (DISPOSABLE) ×1 IMPLANT
SPONGE GAUZE 4X4 12PLY (GAUZE/BANDAGES/DRESSINGS) ×2 IMPLANT
SPONGE LAP 18X18 X RAY DECT (DISPOSABLE) ×4 IMPLANT
STAPLER VISISTAT 35W (STAPLE) ×2 IMPLANT
SUT VIC AB 0 CTB1 27 (SUTURE) ×8 IMPLANT
SUT VIC AB 2-0 CTB1 (SUTURE) ×8 IMPLANT
TOWEL OR 17X24 6PK STRL BLUE (TOWEL DISPOSABLE) ×2 IMPLANT
TOWEL OR 17X26 10 PK STRL BLUE (TOWEL DISPOSABLE) ×2 IMPLANT
TRAY FOLEY CATH 14FR (SET/KITS/TRAYS/PACK) ×1 IMPLANT
WATER STERILE IRR 1000ML POUR (IV SOLUTION) ×6 IMPLANT

## 2011-06-16 NOTE — Preoperative (Signed)
Beta Blockers   Reason not to administer Beta Blockers:Not Applicable 

## 2011-06-16 NOTE — Op Note (Signed)
NAMEJENICE, LEINER NO.:  [id number]  MEDICAL RECORD NO.:  [id number]  LOCATION:  5040                         FACILITY:  [facility]  PHYSICIAN:  Myrtie Neither, MD      DATE OF BIRTH:  1955-09-20  DATE OF PROCEDURE:  06/16/2011 DATE OF DISCHARGE:                              OPERATIVE REPORT   PREOPERATIVE DIAGNOSIS:  Degenerative joint disease, left knee.  POSTOPERATIVE DIAGNOSIS:  Degenerative joint disease, left knee.  ANESTHESIA:  General.  PROCEDURE:  Left total knee arthroplasty, Biomet implant.  DESCRIPTION OF PROCEDURE:  The patient was taken to the operating room. After given adequate preop medications, given general anesthesia and intubated.  Left knee was prepped with DuraPrep and draped in a sterile manner.  Tourniquet used and Bovie used for hemostasis.  Anterior midline incision made over the left knee going through the skin and subcutaneous tissue.  Medial sharp and blunt dissection made both medially and laterally.  A medial paramedian incision was made into the capsule extending from the tibial tuberosity up to the quadriceps. Patella was reflected laterally.  The knee was taken into a flexed position.  Osteophytes about the patella, femur and tibia were resected. Soft tissue resection was then done, followed by a tibial plateau surface resection at 10 mm.  Tourniquet did not hold up.  The patient was oozing throughout the entire case.  Next, reaming down the femoral canal was done.  Distal femoral cutting jig was put in place at 3 degrees.  Distal femoral cut was made, followed by sizing, which was 7 mm.  Appropriate 7 mm cutting jig was put in place.  Anterior, posterior, and chamfering cuts were made.  Trial implant was put in place and found to be a very good snug fit.  Next, attention was turned to the tibia plateau surface which was measured at 71 mm.  Appropriate cutting jig was put in place, and the appropriate cutting surface of  the tibia was prepared.  Trial components for tibia and femoral components were put in place with a 10 mm spacer.  There was full extension, full flexion, but still some medial and lateral laxity.  12 mm was found to add full stability with full flexion and full extension.  Soft tissue released about the patella and lateral retinaculum was done because the patella was trying to rest laterally from her extreme valgus deformity. A lateral release was not necessary.  Patella was then sized at 34 mm and patella surface was prepared for single post.  All 3 trial components were put in place, 34-mm patella with single post, 7-mm femoral component, and a 12-mm poly.  Range of motion was found to be full extension, full flexion, good medial and lateral stability, and good gliding without subluxing of the patella.  Trial implants were removed.  Copious antibiotic irrigation was done.  Methylmethacrylate was mixed.  The patella and the tibial components were cemented, and the femoral component was press fitted after setting of the cement, and removal of excess cement, again range of motion was tested, and the 12- mm spacer was locked in place.  Again, range of motion full flexion, full extension, good  medial and lateral stability with good gliding of the patella without  subluxation.  Irrigation was done again, followed by wound closure, 0 Vicryl for the fascia, 2-0 for the subcutaneous, and skin staples for the skin.  Bulky compressive dressing was applied.  Knee immobilizer applied.  The patient tolerated the procedure quite well, and went to recovery room in stable and satisfactory condition.  The patient lost approximately 300 mL of blood.     Myrtie Neither, MD     AC/MEDQ  D:  06/16/2011  T:  06/16/2011  Job:  [id number]

## 2011-06-16 NOTE — Anesthesia Procedure Notes (Addendum)
Anesthesia Regional Block:  Femoral nerve block  Pre-Anesthetic Checklist: ,, timeout performed, Correct Patient, Correct Site, Correct Laterality, Correct Procedure,, site marked, risks and benefits discussed, Surgical consent,  Pre-op evaluation,  At surgeon's request and post-op pain management  Laterality: Left  Prep: chloraprep       Needles:  Injection technique: Single-shot  Needle Type: Echogenic Stimulator Needle     Needle Length: 9cm  Needle Gauge: 21    Additional Needles:  Procedures: nerve stimulator Femoral nerve block  Nerve Stimulator or Paresthesia:  Response: Quadriceps muscle contraction, 0.45 mA,   Additional Responses:   Narrative:  Start time: 06/16/2011 7:15 AM End time: 06/16/2011 7:25 AM Injection made incrementally with aspirations every 5 mL.  Performed by: Personally  Anesthesiologist: Dr Chaney Malling  Additional Notes: Functioning IV was confirmed and monitors were applied.  A 90mm 21ga Arrow echogenic stimulator needle was used. Sterile prep and drape,hand hygiene and sterile gloves were used.  Negative aspiration and negative test dose prior to incremental administration of local anesthetic. The patient tolerated the procedure well.    Femoral nerve block Procedure Name: Intubation Date/Time: 06/16/2011 8:27 AM Performed by: Romie Minus Pre-anesthesia Checklist: Patient identified, Emergency Drugs available, Suction available and Patient being monitored Patient Re-evaluated:Patient Re-evaluated prior to inductionOxygen Delivery Method: Circle System Utilized Preoxygenation: Pre-oxygenation with 100% oxygen Intubation Type: IV induction Ventilation: Mask ventilation without difficulty and Oral airway inserted - appropriate to patient size Laryngoscope Size: Miller and 2 Grade View: Grade I Tube type: Oral Tube size: 7.5 mm Number of attempts: 1 Placement Confirmation: ETT inserted through vocal cords under direct vision,  positive  ETCO2 and breath sounds checked- equal and bilateral Secured at: 22 cm Tube secured with: Tape Dental Injury: Teeth and Oropharynx as per pre-operative assessment

## 2011-06-16 NOTE — Plan of Care (Signed)
Problem: Consults Goal: Diagnosis- Total Joint Replacement Outcome: Progressing Primary Total Knee L

## 2011-06-16 NOTE — Anesthesia Preprocedure Evaluation (Addendum)
Anesthesia Evaluation  Patient identified by MRN, date of birth, ID band Patient awake    Reviewed: Allergy & Precautions, H&P , NPO status , Patient's Chart, lab work & pertinent test results  History of Anesthesia Complications Negative for: history of anesthetic complications  Airway Mallampati: II  Neck ROM: full    Dental   Pulmonary asthma ,          Cardiovascular hypertension, Pt. on medications Regular Normal    Neuro/Psych    GI/Hepatic   Endo/Other    Renal/GU      Musculoskeletal   Abdominal   Peds  Hematology   Anesthesia Other Findings   Reproductive/Obstetrics                          Anesthesia Physical Anesthesia Plan  ASA: III  Anesthesia Plan: General and Regional   Post-op Pain Management: MAC Combined w/ Regional for Post-op pain   Induction: Intravenous  Airway Management Planned: Oral ETT  Additional Equipment:   Intra-op Plan:   Post-operative Plan:   Informed Consent: I have reviewed the patients History and Physical, chart, labs and discussed the procedure including the risks, benefits and alternatives for the proposed anesthesia with the patient or authorized representative who has indicated his/her understanding and acceptance.     Plan Discussed with: CRNA and Surgeon  Anesthesia Plan Comments:        Anesthesia Quick Evaluation

## 2011-06-16 NOTE — Op Note (Signed)
Pre op diagnosis-DJDleft knee . POST OP DIAGNOSIS :SAME PROCEDURE:LEFT TKA. ANESTHESIA: GEN. OP-NOTE DICTATED REPORT # [id number].TOLERATED PROCEDURE WELL.

## 2011-06-16 NOTE — H&P (Signed)
[name], [name] NO.:  [id number]  MEDICAL RECORD NO.:  [id number]  LOCATION:                                 FACILITY:  PHYSICIAN:  Myrtie Neither, MD      DATE OF BIRTH:  Jul 13, 1955  DATE OF ADMISSION:  06/16/2011 DATE OF DISCHARGE:                             HISTORY & PHYSICAL   CHIEF COMPLAINT:  Painful left knee.  HISTORY OF PRESENT ILLNESS:  This is a 55 year old female being followed in the office for degenerative joint disease involving the left knee. The patient has had a long history of progressive worsening of pain and dysfunction with pain both at rest as well as on weightbearing.  The patient has been treated with analgesics as well as anti-inflammatories. The patient's symptoms and dysfunction progressively worsened.  MEDICATIONS: 1. Colace 100 mg b.i.d. 2. Albuterol 2 puffs q6 hours p.r.n. 3. Lasix 40 mg daily. 4. Ibuprofen 800 mg at bedtime. 5. Lisinopril 1 tablet daily. 6. Percocet 1 q.6 hours p.r.n.  ALLERGIES:  TORADOL and Latex allergy.  FAMILY HISTORY: 1. Diabetes mellitus. 2. Hypertension. 3. Stroke. 4. Heart attack. 5. Dementia.  SOCIAL HISTORY:  The patient does have a history of use of tobacco, but denies use of any alcohol or illegal drugs use.  PAST MEDICAL HISTORY: 1. Hypertension. 2. Hyperlipidemia. 3. Asthma. 4. Obesity. 5. Peripheral edema. 6. Seasonal allergies. 7. History of pulmonary nodule. 8. Degenerative joint disease. 9. Anemia, chronic.  PHYSICAL EXAMINATION:  GENERAL:  Alert and oriented.  No acute distress. VITAL SIGNS:  Temperature 96, pulse 82, respirations 16, blood pressure 110/70,  O2 saturation 98%. HEAD:  Normocephalic. EYES:  Conjunctivae and sclerae are clear. NECK:  Supple. CHEST:  Clear. CARDIAC:  S1, S2 regular. EXTREMITY:  Left knee, genu valgum, crepitus in medial and lateral compartments, +2 effusion.  Range of motion is good, but limited. Negative Homan test.  X-rays  revealed loss of joint space with sclerosis and osteophytes.  IMPRESSION: 1. Degenerative joint disease, left knee. 2. History of hypertension. 3. History of hyperlipidemia. 4. History of asthma. 5. History of chronic anemia.  PLAN:  Left total knee arthroplasty.     Myrtie Neither, MD     AC/MEDQ  D:  06/15/2011  T:  06/16/2011  Job:  [id number]

## 2011-06-16 NOTE — Anesthesia Postprocedure Evaluation (Signed)
Anesthesia Post Note  Patient: [name]  Procedure(s) Performed:  TOTAL KNEE ARTHROPLASTY - left total knee arthroplasty  Anesthesia type: General  Patient location: PACU  Post pain: Pain level controlled and Adequate analgesia  Post assessment: Post-op Vital signs reviewed, Patient's Cardiovascular Status Stable, Respiratory Function Stable, Patent Airway and Pain level controlled  Last Vitals:  Filed Vitals:   06/16/11 1345  BP: 119/84  Pulse: 82  Temp: 36.1 C  Resp: 22    Post vital signs: Reviewed and stable  Level of consciousness: awake, alert  and oriented  Complications: No apparent anesthesia complications

## 2011-06-17 ENCOUNTER — Encounter (HOSPITAL_COMMUNITY): Payer: Self-pay | Admitting: Orthopedic Surgery

## 2011-06-17 MED ORDER — OXYCODONE-ACETAMINOPHEN 5-325 MG PO TABS
1.0000 | ORAL_TABLET | ORAL | Status: DC | PRN
  Administered 2011-06-17 (×2): 2 via ORAL
  Administered 2011-06-18: 1 via ORAL
  Administered 2011-06-18: 2 via ORAL
  Administered 2011-06-19: 1 via ORAL
  Administered 2011-06-19 – 2011-06-22 (×11): 2 via ORAL
  Filled 2011-06-17: qty 1
  Filled 2011-06-17: qty 2
  Filled 2011-06-17: qty 1
  Filled 2011-06-17 (×3): qty 2
  Filled 2011-06-17: qty 1
  Filled 2011-06-17 (×5): qty 2
  Filled 2011-06-17: qty 1
  Filled 2011-06-17 (×4): qty 2

## 2011-06-17 MED ORDER — METHOCARBAMOL 500 MG PO TABS
500.0000 mg | ORAL_TABLET | Freq: Four times a day (QID) | ORAL | Status: DC | PRN
  Administered 2011-06-17 – 2011-06-21 (×8): 500 mg via ORAL
  Filled 2011-06-17: qty 1
  Filled 2011-06-17: qty 2
  Filled 2011-06-17 (×8): qty 1

## 2011-06-17 MED ORDER — HYDROMORPHONE 0.3 MG/ML IV SOLN
INTRAVENOUS | Status: AC
  Filled 2011-06-17: qty 25

## 2011-06-17 NOTE — Progress Notes (Signed)
Physical Therapy Treatment Patient Details Name: [name] MRN: [id number] DOB: May 14, 1956 Today's Date: 06/17/2011  PT Assessment/Plan  PT - Assessment/Plan Comments on Treatment Session: Pt. too tired and in pain to get back OOB.  Progressing slowly. PT Plan: Discharge plan remains appropriate;Frequency remains appropriate PT Frequency: 7X/week Follow Up Recommendations: Skilled nursing facility Equipment Recommended: Defer to next venue PT Goals  Acute Rehab PT Goals PT Goal Formulation: With patient Time For Goal Achievement: 7 days Pt will go Supine/Side to Sit: with min assist PT Goal: Supine/Side to Sit - Progress: Progressing toward goal Pt will go Sit to Supine/Side: with min assist PT Goal: Sit to Supine/Side - Progress: Not met Pt will go Sit to Stand: with mod assist PT Goal: Sit to Stand - Progress: Progressing toward goal Pt will go Stand to Sit: with min assist PT Goal: Stand to Sit - Progress: Progressing toward goal Pt will Ambulate: 16 - 50 feet;with min assist;with rolling walker PT Goal: Ambulate - Progress: Progressing toward goal Pt will Perform Home Exercise Program: with min assist PT Goal: Perform Home Exercise Program - Progress: Progressing toward goal  PT Treatment Precautions/Restrictions  Precautions Precautions: Knee;Fall Precaution Booklet Issued: Yes (comment) (TKR exercises and no pillow under knee instructions) Precaution Comments: pt. is at increased risk for fall due to obesity and decreased mobility Required Braces or Orthoses: Yes Knee Immobilizer:  (knee immmobilizer in place) Restrictions Weight Bearing Restrictions: Yes LLE Weight Bearing: Partial weight bearing LLE Partial Weight Bearing Percentage or Pounds: 50 % PWB per order from Dr. Montez Morita Mobility (including Balance) Bed Mobility Bed Mobility: No (pt. deferred due to pain and recently back to bed) Supine to Sit: 1: +2 Total assist;With rails;Patient percentage  (comment);HOB elevated (Comment degrees) (pt. 60% ;HOB up 40 degrees) Supine to Sit Details (indicate cue type and reason): needed max cues for technique and safety Sit to Supine - Left: Not tested (comment) Transfers Transfers: No Sit to Stand: 1: +2 Total assist;Patient percentage (comment);With upper extremity assist;From bed (pt. 60%) Sit to Stand Details (indicate cue type and reason): technique and safety cues    Exercise  Total Joint Exercises Ankle Circles/Pumps: AROM;Both;10 reps Quad Sets: Left;5 reps;Strengthening (weak contraction) Other Exercises Other Exercises: gentle distraction with bouncing technique for left LE pain relief.  Pt. reports some relief with technique . End of Session PT - End of Session Activity Tolerance: Patient limited by pain (therapeutic exercises limited by pain this pm) Patient left: in bed;with call bell in reach;with family/visitor present (husband present) Nurse Communication: Mobility status for transfers;Mobility status for ambulation;Weight bearing status General Behavior During Session: Lethargic Cognition: WFL for tasks performed  Ferman Hamming 06/17/2011, 2:40 PM Acute Rehabilitation Services [phone] (pager)

## 2011-06-17 NOTE — Progress Notes (Signed)
Pt having pain LLE/FOOT when up to chair today. Dilaudid PCA  In use, pt may benefit from muscle relaxer. Call placed to Dr. Montez Morita to inquire about med. Await return call.

## 2011-06-17 NOTE — Progress Notes (Signed)
Discharge planning. Patient may need shortterm SNF per therapist, will see if she can progress. will follow.

## 2011-06-17 NOTE — Transfer of Care (Signed)
Immediate Anesthesia Transfer of Care Note  Patient: [name]  Procedure(s) Performed:  TOTAL KNEE ARTHROPLASTY - left total knee arthroplasty  Patient Location: PACU  Anesthesia Type: General  Level of Consciousness: awake, alert  and oriented  Airway & Oxygen Therapy: Patient Spontanous Breathing  Post-op Assessment: Report given to PACU RN and Post -op Vital signs reviewed and stable  Post vital signs: Reviewed and stable  Complications: No apparent anesthesia complications

## 2011-06-17 NOTE — Progress Notes (Signed)
Patient ID: [name], female   DOB: 18-Apr-1956, 55 y.o.   MRN: [id number] Afebrile h&h stable ,doing calf pumps well, encouraged patient to use spirometry,dressing is dry. To start 50% pwb this morning.

## 2011-06-17 NOTE — Progress Notes (Signed)
Physical Therapy Evaluation Patient Details Name: [name] MRN: [id number] DOB: Mar 21, 1956 Today's Date: 06/17/2011  Problem List:  Patient Active Problem List  Diagnoses  . HYPERLIPIDEMIA  . ANEMIA-NOS  . HYPERTENSION  . ASTHMA  . LUNG NODULE  . DEGENERATIVE JOINT DISEASE  . BACK PAIN  . Preventative health care  . Incisional hernia, incarcerated  . Left knee pain  . Right knee pain  . Leg swelling  . Tension headache  . Leg pain, bilateral    Past Medical History:  Past Medical History  Diagnosis Date  . Hypertension   . Hyperlipidemia   . Asthma   . Obesity   . Lower extremity edema     Chronic. 2D echo (2005) - EF 65%.  . Seasonal allergies   . Abnormal EKG 06/2003    History of inverted T waves V1-V3. Normal 2D echo (07/23/2003): LVEF 65%.  . History of multiple pulmonary nodules     Incidental finding: CT Abd/ Pelvis (04/2010) - Several small lower lobe lung nodules, including one pure ground-glass pulmonary nodule measuring 8 mm in the left lower lobe.  Recommend follow-up chest CT (IV contrast preferred) in 6 months to document stability. //  CT Abd/ Pelvis (07/2010) -  3 mm RLL and  8 mm LLL nodule stable.  Other nodules unchanged, likely benign.  . 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 Followed by Sports Medicine, Dr. Jennette Kettle.  . Incarcerated ventral hernia 04/2010    Noted on CT Abd/ pelvis (04/2010). Patient now s/p ventral hernia repair by Dr. Gerrit Friends (12/2010)  . Anemia     BL Hgb 11-12. Ferritin 14 - low normal (08/2007). Colonoscopy 2009 - external hemorrhoids (excellent prep). Last anemia panel (12/2010) - Iron  24, TIBC 269,  B12  316, Folate 11.9, Ferritin 73.  . Night sweats   . Dizziness   . Fatigue     loss of sleep  . Wears dentures   . Wears glasses   . Nausea & vomiting    Past Surgical History:  Past Surgical History  Procedure Date  . Incision and drainage abscess anal 07/2008    I&D and debridgement of  anorectal abscess  . Inguinal hernia repair   . Tubal ligation   . Incisional hernia repair 12/2010    Repair of incarcerated ventral incisional hernia with Ethicon mesh patch - performed by Dr. Gerrit Friends.   . Foot surgery 2004     left    PT Assessment/Plan/Recommendation PT Assessment Clinical Impression Statement: Pt. is POD #1 for left TKR with significant decreases in mobility/gait/strength and ROM.  Do not believe pt. can be DC'd from hospital to home due to these limitations and she will need ST SNF.  In the meantime, will benefit from acute PT to address her limitations. PT Recommendation/Assessment: Patient will need skilled PT in the acute care venue PT Problem List: Decreased strength;Decreased range of motion;Decreased activity tolerance;Decreased mobility;Decreased knowledge of use of DME;Decreased knowledge of precautions;Obesity;Pain;Cardiopulmonary status limiting activity (dizzy while up) Barriers to Discharge: Inaccessible home environment;Decreased caregiver support;Other (comment) (pt. limitations) PT Therapy Diagnosis : Difficulty walking;Acute pain PT Plan PT Frequency: 7X/week PT Treatment/Interventions: DME instruction;Gait training;Functional mobility training;Therapeutic activities;Therapeutic exercise;Patient/family education PT Recommendation Follow Up Recommendations: Skilled nursing facility Equipment Recommended: Defer to next venue PT Goals  Acute Rehab PT Goals PT Goal Formulation: With patient Time For Goal Achievement: 7 days Pt will go Supine/Side to Sit: with min assist PT Goal: Supine/Side  to Sit - Progress: Progressing toward goal Pt will go Sit to Supine/Side: with min assist PT Goal: Sit to Supine/Side - Progress: Not met Pt will go Sit to Stand: with mod assist PT Goal: Sit to Stand - Progress: Progressing toward goal Pt will go Stand to Sit: with min assist PT Goal: Stand to Sit - Progress: Progressing toward goal Pt will Ambulate: 16 - 50  feet;with min assist;with rolling walker PT Goal: Ambulate - Progress: Progressing toward goal Pt will Perform Home Exercise Program: with min assist PT Goal: Perform Home Exercise Program - Progress: Progressing toward goal  PT Evaluation Precautions/Restrictions  Precautions Precautions: Knee;Fall Precaution Booklet Issued: Yes (comment) (TKR exercises and no pillow under knee instructions) Precaution Comments: pt. is at increased risk for fall due to obesity and decreased mobility Required Braces or Orthoses: Yes Knee Immobilizer:  (knee immmobilizer in place) Restrictions Weight Bearing Restrictions: Yes LLE Weight Bearing: Partial weight bearing LLE Partial Weight Bearing Percentage or Pounds: 50 % PWB per order from Dr. Montez Morita Prior Functioning  Home Living Lives With: Spouse;Daughter Receives Help From: Family Type of Home: House Home Layout: Two level;1/2 bath on main level (bedroom set up on first floor for pt.) Home Access: Stairs to enter Entrance Stairs-Rails: Doctor, general practice of Steps: 4 Bathroom Toilet: Standard Home Adaptive Equipment: None Prior Function Level of Independence: Independent with basic ADLs;Independent with homemaking with ambulation;Independent with gait;Independent with transfers Vocation: On disability Cognition Cognition Arousal/Alertness: Awake/alert Overall Cognitive Status: Appears within functional limits for tasks assessed Orientation Level: Oriented X4 Sensation/Coordination Sensation Light Touch: Appears Intact (detects light touch both lower legs) Extremity Assessment RLE Assessment RLE Assessment: Within Functional Limits LLE Assessment LLE Assessment: Exceptions to WFL LLE AROM (degrees) Left Knee Extension 0-130: -8  Left Knee Flexion 0-140: 35  LLE Strength Left Knee Extension: 1/5 Left Ankle Dorsiflexion: 3/5 Mobility (including Balance) Bed Mobility Bed Mobility: Yes Supine to Sit: 1: +2 Total  assist;With rails;Patient percentage (comment);HOB elevated (Comment degrees) (pt. 60% ;HOB up 40 degrees) Supine to Sit Details (indicate cue type and reason): needed max cues for technique and safety Sit to Supine - Left: Not tested (comment) Transfers Transfers: Yes Sit to Stand: 1: +2 Total assist;Patient percentage (comment);With upper extremity assist;From bed (pt. 60%) Sit to Stand Details (indicate cue type and reason): technique and safety cues Stand to Sit: 1: +2 Total assist;Patient percentage (comment) (pt. 60%) Stand to Sit Details: assist for controlled descent and cues for technique and safety Ambulation/Gait Ambulation/Gait: Yes Ambulation/Gait Assistance: 1: +2 Total assist (pt. 70%) Ambulation/Gait Assistance Details (indicate cue type and reason): needed technique cues and assist/instruction for [facility] Ambulation Distance (Feet): 2 Feet Assistive device: Rolling walker (bariatric) Gait Pattern: Step-to pattern;Decreased stance time - right;Decreased stance time - left;Decreased step length - right;Decreased step length - left;Decreased hip/knee flexion - right;Decreased hip/knee flexion - left;Trunk flexed Stairs: No    Exercise  Total Joint Exercises Ankle Circles/Pumps: AROM;Both;10 reps Quad Sets: Strengthening;Left;5 reps (weak contraction noted) End of Session PT - End of Session Equipment Utilized During Treatment: Gait belt;Left knee immobilizer Activity Tolerance: Patient limited by pain;Patient limited by fatigue;Treatment limited secondary to medical complications (Comment) (limited greatly by body habitus) Patient left: in chair;with call bell in reach Nurse Communication: Mobility status for transfers;Mobility status for ambulation;Weight bearing status General Behavior During Session: Lethargic Cognition: WFL for tasks performed  Ferman Hamming 06/17/2011, 11:30 AM Acute Rehabilitation Services [phone] (pager)

## 2011-06-18 NOTE — Progress Notes (Signed)
Physical Therapy Treatment Patient Details Name: [name] MRN: [id number] DOB: Oct 22, 1955 Today's Date: 06/18/2011  PT Assessment/Plan  PT - Assessment/Plan Comments on Treatment Session: patient slow to progress, not really able to ambulate any further today, however, did better with mobility - required less assistance for  bed mobility and transfers.  agree with SNF for discharge.  unable to measure LLE ROM due to dizziness during/after treatment. PT Plan: Discharge plan remains appropriate PT Frequency: 7X/week Follow Up Recommendations: Skilled nursing facility Equipment Recommended: Defer to next venue PT Goals  Acute Rehab PT Goals PT Goal: Supine/Side to Sit - Progress: Progressing toward goal PT Goal: Sit to Supine/Side - Progress: Progressing toward goal PT Goal: Sit to Stand - Progress: Progressing toward goal PT Goal: Stand to Sit - Progress: Progressing toward goal PT Goal: Ambulate - Progress: Progressing toward goal PT Goal: Perform Home Exercise Program - Progress: Progressing toward goal  PT Treatment Precautions/Restrictions  Precautions Precautions: Knee;Fall Precaution Booklet Issued: Yes (comment) (TKR exercises and no pillow under knee instructions) Precaution Comments: pt. is at increased risk for fall due to obesity and decreased mobility Required Braces or Orthoses: Yes Knee Immobilizer: On except when in CPM Restrictions Weight Bearing Restrictions: Yes LLE Weight Bearing: Partial weight bearing LLE Partial Weight Bearing Percentage or Pounds: 50% Mobility (including Balance) Bed Mobility Supine to Sit: 4: Min assist;HOB elevated (Comment degrees);With rails Supine to Sit Details (indicate cue type and reason): assist for LLE Transfers Sit to Stand: 1: +2 Total assist;From bed;With upper extremity assist;Patient percentage (comment) (70%) Sit to Stand Details (indicate cue type and reason): +2 for safety, cueing for sequencing Stand to Sit: 1: +2  Total assist;Patient percentage (comment);To chair/3-in-1;With upper extremity assist (70%) Stand to Sit Details: assist for safety, control descent, cueing for technique Ambulation/Gait Ambulation/Gait: Yes Ambulation/Gait Assistance: 1: +2 Total assist;Patient percentage (comment) (70%) Ambulation/Gait Assistance Details (indicate cue type and reason): needed cueing for weight bearing status, technique, patient complained of dizziness and thus returned to sitting position. Ambulation Distance (Feet): 2 Feet Assistive device: Rolling walker Gait Pattern: Step-to pattern;Decreased stance time - left;Trunk flexed Gait velocity: significant decreased Stairs: No Wheelchair Mobility Wheelchair Mobility: No    Exercise  Total Joint Exercises Ankle Circles/Pumps: AROM;Both;10 reps;Supine Quad Sets: AROM;Both;10 reps;Supine Short Arc Quad: AAROM;Left;Supine;10 reps Heel Slides: AAROM;Left;10 reps;Supine Hip ABduction/ADduction: AAROM;Left;10 reps;Supine Straight Leg Raises: AAROM;Left;10 reps;Supine End of Session PT - End of Session Equipment Utilized During Treatment: Gait belt;Left knee immobilizer Activity Tolerance: Patient limited by pain;Patient limited by fatigue Patient left: in chair;with call bell in reach;with family/visitor present General Behavior During Session: [facility] for tasks performed Cognition: [facility] for tasks performed  Olivia Canter, PT   06/18/2011, 10:35 AM [phone]

## 2011-06-18 NOTE — Progress Notes (Signed)
Occupational Therapy Evaluation Patient Details Name: [name] MRN: [id number] DOB: [birth year] Today's Date: 06/18/2011 Time: 8:32am-9:03am Ev II; 1 TA; 2 Indirect care Problem List:  Patient Active Problem List  Diagnoses  . HYPERLIPIDEMIA  . ANEMIA-NOS  . HYPERTENSION  . ASTHMA  . LUNG NODULE  . DEGENERATIVE JOINT DISEASE  . BACK PAIN  . Preventative health care  . Incisional hernia, incarcerated  . Left knee pain  . Right knee pain  . Leg swelling  . Tension headache  . Leg pain, bilateral    Past Medical History:  Past Medical History  Diagnosis Date  . Hypertension   . Hyperlipidemia   . Asthma   . Obesity   . Lower extremity edema     Chronic. 2D echo (2005) - EF 65%.  . Seasonal allergies   . Abnormal EKG 06/2003    History of inverted T waves V1-V3. Normal 2D echo (07/23/2003): LVEF 65%.  . History of multiple pulmonary nodules     Incidental finding: CT Abd/ Pelvis (04/2010) - Several small lower lobe lung nodules, including one pure ground-glass pulmonary nodule measuring 8 mm in the left lower lobe.  Recommend follow-up chest CT (IV contrast preferred) in 6 months to document stability. //  CT Abd/ Pelvis (07/2010) -  3 mm RLL and  8 mm LLL nodule stable.  Other nodules unchanged, likely benign.  . 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 Followed by Sports Medicine, Dr. Jennette Kettle.  . Incarcerated ventral hernia 04/2010    Noted on CT Abd/ pelvis (04/2010). Patient now s/p ventral hernia repair by Dr. Gerrit Friends (12/2010)  . Anemia     BL Hgb 11-12. Ferritin 14 - low normal (08/2007). Colonoscopy 2009 - external hemorrhoids (excellent prep). Last anemia panel (12/2010) - Iron  24, TIBC 269,  B12  316, Folate 11.9, Ferritin 73.  . Night sweats   . Dizziness   . Fatigue     loss of sleep  . Wears dentures   . Wears glasses   . Nausea & vomiting    Past Surgical History:  Past Surgical History  Procedure Date  . Incision and drainage  abscess anal 07/2008    I&D and debridgement of anorectal abscess  . Inguinal hernia repair   . Tubal ligation   . Incisional hernia repair 12/2010    Repair of incarcerated ventral incisional hernia with Ethicon mesh patch - performed by Dr. Gerrit Friends.   . Foot surgery 2004     left  . Total knee arthroplasty 06/16/2011    Procedure: TOTAL KNEE ARTHROPLASTY;  Surgeon: Kennieth Rad;  Location: [facility] OR;  Service: Orthopedics;  Laterality: Left;  left total knee arthroplasty    OT Assessment/Plan/Recommendation OT Assessment Clinical Impression Statement: Pt is currently +1 assist LB bathe/dress, pt able to sit unsupported @ EOB today x56min, w/ bed mobility Mod assist x1 & increased time. Pt declined functional transfers and transfer to chair stating that chair caused "swelling yesterday". Rec SNF rehab vs HHOT depending on pt progress w/ therapy. If goes home, pt will need wide 3:1 OT Recommendation/Assessment: Patient will need skilled OT in the acute care venue OT Problem List: Decreased activity tolerance;Decreased strength;Decreased knowledge of use of DME or AE;Decreased knowledge of precautions;Pain OT Therapy Diagnosis : Acute pain;Generalized weakness OT Plan OT Frequency: Min 2X/week OT Treatment/Interventions: Self-care/ADL training;Therapeutic activities;DME and/or AE instruction;Patient/family education OT Recommendation Follow Up Recommendations: Home health OT;Skilled nursing facility Equipment Recommended: Other (  comment) (Wide 3:1) Individuals Consulted Consulted and Agree with Results and Recommendations: Patient OT Goals Acute Rehab OT Goals OT Goal Formulation: With patient Time For Goal Achievement: 7 days ADL Goals Pt Will Perform Grooming: with supervision;with min assist;Standing at sink ADL Goal: Grooming - Progress: Progressing toward goals Pt Will Perform Upper Body Dressing: with modified independence;Sitting, chair ADL Goal: Upper Body Dressing - Progress:  Progressing toward goals Pt Will Perform Lower Body Dressing: with modified independence;with min assist;with adaptive equipment;Sit to stand from chair (Min guard assist) ADL Goal: Lower Body Dressing - Progress: Progressing toward goals Pt Will Perform Tub/Shower Transfer: with min assist;with DME;Transfer tub bench ADL Goal: Tub/Shower Transfer - Progress: Progressing toward goals Additional ADL Goal #1: Pt will perform toileting transfers, hygeine and clothing negotiation w/ supervision-min guard assist to wide 3:1 over toilet ADL Goal: Additional Goal #1 - Progress: Progressing toward goals  OT Evaluation Precautions/Restrictions  Precautions Precautions: Knee;Other (comment) Precaution Booklet Issued: Yes (comment) (TKR exercises and no pillow under knee instructions) Precaution Comments: pt. is at increased risk for fall due to obesity and decreased mobility Required Braces or Orthoses: Yes Knee Immobilizer: On at all times Restrictions Weight Bearing Restrictions: Yes LLE Weight Bearing: Partial weight bearing LLE Partial Weight Bearing Percentage or Pounds: 50% L LE Other Position/Activity Restrictions: requires increased time for tasks/activity Prior Functioning Home Living Lives With: Spouse;Daughter Receives Help From: Family Type of Home: House Home Layout: Two level;1/2 bath on main level (bedroom set up on first floor for pt.) Home Access: Stairs to enter Entrance Stairs-Rails: Doctor, general practice of Steps: 4 Bathroom Toilet: Standard Home Adaptive Equipment: None Prior Function Level of Independence: Independent with basic ADLs;Independent with homemaking with ambulation;Independent with gait;Independent with transfers Vocation: On disability ADL ADL Eating/Feeding: Performed;Modified independent Where Assessed - Eating/Feeding: Bed level Grooming: Performed;Wash/dry hands;Wash/dry face;Modified independent Where Assessed - Grooming: Sitting,  bed;Unsupported Upper Body Bathing: Simulated;Minimal assistance Where Assessed - Upper Body Bathing: Sitting, bed;Unsupported Lower Body Bathing: Simulated;+1 Total assistance Where Assessed - Lower Body Bathing: Sitting, bed;Unsupported Lower Body Dressing: +1 Total assistance;Performed Where Assessed - Lower Body Dressing: Sitting, bed;Unsupported Toilet Transfer: Other (comment);Not assessed (Toileting transfer held secondary to pt c/o dizzy) ADL Comments: Pt is currently +1 assist LB bathe/dress sit @ EOB, pt able to sit @ EOB today x11min, w/ bed mobility Mod assist x1 & increased time. Pt declined functional transfers and transfer to chair stating that chair caused "swelling yesterday". Rec SNF rehab vs HHOT depending on pt progress w/ therapy. If goes home, pt will need wide 3:1. Vision/Perception  Vision - History Patient Visual Report: No change from baseline Cognition Cognition Arousal/Alertness: Awake/alert Overall Cognitive Status: Appears within functional limits for tasks assessed Orientation Level: Oriented X4 Sensation/Coordination Sensation Light Touch: Appears Intact Coordination Gross Motor Movements are Fluid and Coordinated: Yes Fine Motor Movements are Fluid and Coordinated: Yes Extremity Assessment RUE Assessment RUE Assessment: Within Functional Limits LUE Assessment LUE Assessment: Within Functional Limits Mobility  Bed Mobility Bed Mobility: Yes Supine to Sit: 3: Mod assist;With rails;HOB elevated (Comment degrees) Supine to Sit Details (indicate cue type and reason): HOB elevated to approx 30 drgrees. Requires increased time. Sit to Supine - Left: 3: Mod assist Sit to Supine - Left Details (indicate cue type and reason): Assist w/ LE's; pt requires increased time for tasks Transfers Sit to Stand: Not tested (comment) Sit to Stand Details (indicate cue type and reason): TBA  Stand to Sit: Not tested (comment) Stand to Sit Details:  TBA  End of  Session OT - End of Session Equipment Utilized During Treatment: Left knee immobilizer Activity Tolerance: Patient tolerated treatment well Patient left: in bed;with call bell in reach;with family/visitor present General Behavior During Session: [facility] for tasks performed Cognition: [facility] for tasks performed   Alm Bustard 06/18/2011, 12:37 PM

## 2011-06-18 NOTE — Plan of Care (Signed)
Problem: Phase II Progression Outcomes Goal: Ambulates Outcome: Progressing Progressing slowly with ambulation

## 2011-06-18 NOTE — Progress Notes (Signed)
Patient ID: [name], female   DOB: 1955-08-18, 55 y.o.   MRN: [id number] AFEBRILE, H&H STABLE, LESS PAIN, WILL D/C  PCA. ABLE TO DO  HEEL LIFT WITH PILLOW BEHIND KNEE.

## 2011-06-18 NOTE — Progress Notes (Signed)
Physical Therapy Treatment Patient Details Name: [name] MRN: [id number] DOB: 1955-09-18 Today's Date: 06/18/2011  PT Assessment/Plan  PT - Assessment/Plan Comments on Treatment Session: patient desired to return to bed; patient did better this afternoon - less pain.  Did have difficulty rising from chair and required more assistance sit - to - stand. PT Plan: Discharge plan remains appropriate PT Frequency: 7X/week Follow Up Recommendations: Skilled nursing facility Equipment Recommended: Defer to next venue PT Goals  Acute Rehab PT Goals PT Goal: Supine/Side to Sit - Progress: Progressing toward goal PT Goal: Sit to Supine/Side - Progress: Progressing toward goal PT Goal: Sit to Stand - Progress: Progressing toward goal PT Goal: Stand to Sit - Progress: Progressing toward goal PT Goal: Ambulate - Progress: Progressing toward goal PT Goal: Perform Home Exercise Program - Progress: Other (comment) (not addressed this session)  PT Treatment Precautions/Restrictions  Precautions Precautions: Knee;Other (comment) Precaution Booklet Issued: Yes (comment) (TKR exercises and no pillow under knee instructions) Precaution Comments: pt. is at increased risk for fall due to obesity and decreased mobility Required Braces or Orthoses: Yes Knee Immobilizer: On at all times Restrictions Weight Bearing Restrictions: Yes LLE Weight Bearing: Partial weight bearing LLE Partial Weight Bearing Percentage or Pounds: 50% L LE Other Position/Activity Restrictions: requires increased time for tasks/activity Mobility (including Balance) Bed Mobility Bed Mobility: Yes Sit to Supine - Left: 4: Min assist Sit to Supine - Left Details (indicate cue type and reason): for LLE Transfers Sit to Stand: 1: +2 Total assist;With upper extremity assist;From chair/3-in-1;Patient percentage (comment) (50%) Sit to Stand Details (indicate cue type and reason): assist to reach upright; assist for safety Stand  to Sit: 4: Min assist;To bed Stand to Sit Details: to control descent; verbal cues for technique Ambulation/Gait Ambulation/Gait: Yes Ambulation/Gait Assistance: 1: +2 Total assist;Patient percentage (comment) (70%) Ambulation/Gait Assistance Details (indicate cue type and reason): +2 assist for safety; verbal cues for sequencing and to maintain WB status Ambulation Distance (Feet): 2 Feet Assistive device: Rolling walker Gait Pattern: Trunk flexed;Step-to pattern Gait velocity: significant decreased Stairs: No Wheelchair Mobility Wheelchair Mobility: No    Exercise  End of Session PT - End of Session Equipment Utilized During Treatment: Gait belt;Left knee immobilizer Activity Tolerance: Patient tolerated treatment well Patient left: in bed Nurse Communication: Mobility status for ambulation;Mobility status for transfers General Behavior During Session: [facility] for tasks performed Cognition: [facility] for tasks performed  Olivia Canter, PT 06/18/2011, 2:19 PM [phone]

## 2011-06-19 MED ORDER — MAGNESIUM CITRATE PO SOLN
1.0000 | Freq: Once | ORAL | Status: AC
  Administered 2011-06-19: 1 via ORAL
  Filled 2011-06-19: qty 296

## 2011-06-19 NOTE — Progress Notes (Signed)
Physical Therapy Treatment Patient Details Name: [name] MRN: [id number] DOB: [birth year] Today's Date: 06/19/2011  PT Assessment/Plan  PT - Assessment/Plan Comments on Treatment Session: slow, and steady improvement PT Plan: Discharge plan remains appropriate PT Frequency: 7X/week Follow Up Recommendations: Skilled nursing facility Equipment Recommended: Defer to next venue PT Goals  Acute Rehab PT Goals Pt will go Supine/Side to Sit: with min assist PT Goal: Supine/Side to Sit - Progress: Progressing toward goal (almost met, need to see consistent performance) Pt will go Sit to Supine/Side: with min assist PT Goal: Sit to Supine/Side - Progress: Other (comment) Pt will go Sit to Stand: with mod assist PT Goal: Sit to Stand - Progress: Progressing toward goal Pt will go Stand to Sit: with min assist PT Goal: Stand to Sit - Progress: Progressing toward goal Pt will Ambulate: 16 - 50 feet;with min assist;with rolling walker PT Goal: Ambulate - Progress: Progressing toward goal Pt will Perform Home Exercise Program: with min assist PT Goal: Perform Home Exercise Program - Progress: Other (comment)  PT Treatment Precautions/Restrictions  Precautions Precautions: Knee;Other (comment) (decr activity tolerance) Precaution Booklet Issued: Yes (comment) (TKR exercises and no pillow under knee instructions) Precaution Comments: pt. is at increased risk for fall due to obesity and decreased mobility Required Braces or Orthoses: Yes Knee Immobilizer: On at all times Restrictions Weight Bearing Restrictions: Yes LLE Weight Bearing: Partial weight bearing LLE Partial Weight Bearing Percentage or Pounds: 50% Other Position/Activity Restrictions: requires increased time for tasks/activity Mobility (including Balance) Bed Mobility Supine to Sit: 4: Min assist;With rails;HOB elevated (Comment degrees) (HOD slightly elevated; KI on) Supine to Sit Details (indicate cue type and reason):  cues for technique and hand placement; improving level of assist; still less efficient scoot Transfers Sit to Stand: 1: +2 Total assist;From elevated surface;With upper extremity assist (pt =65%) Sit to Stand Details (indicate cue type and reason): times 2 reps: from bed and from 3in1 commode; good progress, still dependent on momentum Stand to Sit: 4: Min assist;To chair/3-in-1 Stand to Sit Details: cues for safety, hand placement Ambulation/Gait Ambulation/Gait Assistance: 1: +2 Total assist;Patient percentage (comment) (pt = 75%) Ambulation/Gait Assistance Details (indicate cue type and reason): cues for sequence, to push through UEs for L 50%PWB Ambulation Distance (Feet): 5 Feet Assistive device: Rolling walker Gait Pattern: Trunk flexed;Step-to pattern Gait velocity: significant decreased    Exercise    End of Session PT - End of Session Equipment Utilized During Treatment: Gait belt;Left knee immobilizer Activity Tolerance: Patient limited by fatigue Nurse Communication: Mobility status for ambulation;Mobility status for transfers General Behavior During Session: [facility] for tasks performed Cognition: [facility] for tasks performed  Van Clines [facility], [place] [phone] 06/19/2011, 10:38 AM

## 2011-06-19 NOTE — Progress Notes (Signed)
Patient ID: [name], female   DOB: 15-Mar-1956, 55 y.o.   MRN: [id number] Pain under control ,temp. 100 ,encuorage use of spirometry,progressing with pt. Plan discharge home with HH&pt arranged.

## 2011-06-19 NOTE — Progress Notes (Signed)
CSW met with pt and pt's husband RE: SNF placement. Pt has declined SNF and husband reports he is able to provide assist 24 hours a day. Pt prefers [facility] for [facility] needs. CSW signing off.  Dellie Burns, MSW, [place] [phone]

## 2011-06-20 LAB — HEMOGLOBIN AND HEMATOCRIT, BLOOD
HCT: 19.5 % — ABNORMAL LOW (ref 36.0–46.0)
Hemoglobin: 6.2 g/dL — CL (ref 12.0–15.0)

## 2011-06-20 LAB — PREPARE RBC (CROSSMATCH)

## 2011-06-20 MED ORDER — WARFARIN SODIUM 10 MG PO TABS
10.0000 mg | ORAL_TABLET | Freq: Once | ORAL | Status: AC
  Administered 2011-06-20: 10 mg via ORAL
  Filled 2011-06-20: qty 1

## 2011-06-20 MED ORDER — SODIUM CHLORIDE 0.45 % IV SOLN
INTRAVENOUS | Status: DC
  Administered 2011-06-21: 06:00:00 via INTRAVENOUS

## 2011-06-20 MED ORDER — WARFARIN VIDEO
Freq: Once | Status: AC
  Administered 2011-06-20: 18:00:00

## 2011-06-20 MED ORDER — COUMADIN BOOK
Freq: Once | Status: DC
  Filled 2011-06-20: qty 1

## 2011-06-20 NOTE — Progress Notes (Signed)
Patient ID: [name], female   DOB: [birth year], 55 y.o.   MRN: [id number] Temp 99.5 pain under control. Moving slow PT,may need to consider NHP ,PATIENT PREFERS TO GO HOME.

## 2011-06-20 NOTE — Progress Notes (Signed)
ANTICOAGULATION CONSULT NOTE - Initial Consult  Pharmacy Consult for Coumadin Indication: VTE prophylaxis  Allergies  Allergen Reactions  . Toradol Shortness Of Breath and Palpitations  . Latex Other (See Comments)    Powdered. Confirm type of reaction with patient.    Patient Measurements: Height: 5\' 4"  (162.6 cm) Weight: 336 lb (152.409 kg) IBW/kg (Calculated) : 54.7  Adjusted Body Weight:  Vital Signs: Temp: 99.3 F (37.4 C) (12/10 1356) Temp src: Oral (12/10 0600) BP: 137/74 mmHg (12/10 1356) Pulse Rate: 94  (12/10 1356)  Labs:  Basename 06/20/11 1220  HGB 6.2*  HCT 19.5*  PLT --  APTT --  LABPROT --  INR --  HEPARINUNFRC --  CREATININE --  CKTOTAL --  CKMB --  TROPONINI --   Estimated Creatinine Clearance: 119 ml/min (by C-G formula based on Cr of 0.69).  Medical History: Past Medical History  Diagnosis Date  . Hypertension   . Hyperlipidemia   . Asthma   . Obesity   . Lower extremity edema     Chronic. 2D echo (2005) - EF 65%.  . Seasonal allergies   . Abnormal EKG 06/2003    History of inverted T waves V1-V3. Normal 2D echo (07/23/2003): LVEF 65%.  . History of multiple pulmonary nodules     Incidental finding: CT Abd/ Pelvis (04/2010) - Several small lower lobe lung nodules, including one pure ground-glass pulmonary nodule measuring 8 mm in the left lower lobe.  Recommend follow-up chest CT (IV contrast preferred) in 6 months to document stability. //  CT Abd/ Pelvis (07/2010) -  3 mm RLL and  8 mm LLL nodule stable.  Other nodules unchanged, likely benign.  . 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 Followed by Sports Medicine, Dr. Jennette Kettle.  . Incarcerated ventral hernia 04/2010    Noted on CT Abd/ pelvis (04/2010). Patient now s/p ventral hernia repair by Dr. Gerrit Friends (12/2010)  . Anemia     BL Hgb 11-12. Ferritin 14 - low normal (08/2007). Colonoscopy 2009 - external hemorrhoids (excellent prep). Last anemia panel (12/2010) - Iron   24, TIBC 269,  B12  316, Folate 11.9, Ferritin 73.  . Night sweats   . Dizziness   . Fatigue     loss of sleep  . Wears dentures   . Wears glasses   . Nausea & vomiting     Medications:  Scheduled:    . docusate sodium  100 mg Oral BID  . furosemide  20 mg Oral Daily  . hydrochlorothiazide  25 mg Oral Daily  . lisinopril  20 mg Oral Daily  . magnesium citrate  1 Bottle Oral Once  . DISCONTD: HYDROmorphone PCA 0.3 mg/mL   Intravenous Q4H    Assessment: Patient is a 56 y.o s/p Left  TKA on 12/06.  To start Coumadin for VTE prophylaxis.  Goal of Therapy:  INR 2-3   Plan:  1) Coumadin 10 mg PO x1 today   Braxtin Bamba P 06/20/2011,2:12 PM

## 2011-06-20 NOTE — Progress Notes (Signed)
Physical Therapy Treatment Patient Details Name: [name] MRN: [id number] DOB: [birth year] Today's Date: 06/20/2011  PT Assessment/Plan  PT - Assessment/Plan Comments on Treatment Session: continuing to improve; pt must be able to negotiate 4 steps for dc home while maintaining PWB; plan to practice next session -- may need to consider ambulance transport if unsafe with steps, pt verbalized understanding PT Plan: Discharge plan needs to be updated;Equipment recommendations need to be updated PT Frequency: 7X/week Follow Up Recommendations: Home health PT Equipment Recommended: Rolling walker with 5" wheels;3 in 1 bedside comode (Bariatric) PT Goals  Acute Rehab PT Goals PT Goal Formulation: With patient/family Time For Goal Achievement: 7 days Pt will go Supine/Side to Sit: with min assist PT Goal: Supine/Side to Sit - Progress: Met Pt will go Sit to Supine/Side: with min assist PT Goal: Sit to Supine/Side - Progress: Other (comment) Pt will go Sit to Stand: with mod assist PT Goal: Sit to Stand - Progress: Met Pt will go Stand to Sit: with min assist PT Goal: Stand to Sit - Progress: Partly met Pt will Ambulate: 16 - 50 feet;with min assist;with rolling walker PT Goal: Ambulate - Progress: Progressing toward goal Pt will Go Up / Down Stairs: 3-5 stairs;with min assist;with rolling walker (maintaining 50% PWB safely) PT Goal: Up/Down Stairs - Progress: Other (comment) Pt will Perform Home Exercise Program: with min assist PT Goal: Perform Home Exercise Program - Progress: Partly met  PT Treatment Precautions/Restrictions  Precautions Precautions: Knee Precaution Booklet Issued: Yes (comment) (TKR exercises and no pillow under knee instructions) Precaution Comments: pt. is at increased risk for fall due to obesity and decreased mobility Required Braces or Orthoses: Yes Knee Immobilizer: On at all times Restrictions Weight Bearing Restrictions: Yes LLE Weight Bearing:  Partial weight bearing LLE Partial Weight Bearing Percentage or Pounds: 50% Other Position/Activity Restrictions: requires increased time for tasks/activity Mobility (including Balance) Bed Mobility Supine to Sit: 4: Min assist (without physical contact) Supine to Sit Details (indicate cue type and reason): without rails; bed height adjusted to approximate home; incr. time, but gatting up without physical assist; Improving! Transfers Sit to Stand: 3: Mod assist;From bed Sit to Stand Details (indicate cue type and reason): requiring physical assist for steadying RW as pt dependent on UE support on RW for successful sit to stand Stand to Sit: 4: Min assist;With upper extremity assist;To chair/3-in-1 Stand to Sit Details: better control of stand to sit Ambulation/Gait Ambulation/Gait Assistance: 1: +2 Total assist;Patient percentage (comment) (pt=85%) Ambulation/Gait Assistance Details (indicate cue type and reason): cues for sequence, 50% PWB, self monitor for activity tolerance; still short distance, but activity to l is improving Ambulation Distance (Feet): 15 Feet Assistive device: Rolling walker Gait Pattern: Trunk flexed;Step-to pattern Gait velocity: significant decreased Stairs: No (Discussed technique for steps with PWB)    Exercise  Total Joint Exercises Ankle Circles/Pumps: AROM;Both;10 reps;Supine Quad Sets: AROM;Both;10 reps;Supine Short Arc Quad: AROM;Left;10 reps;Supine (improving knee control) Heel Slides: AAROM;Left;10 reps;Supine Straight Leg Raises: AAROM;Left;10 reps;Supine End of Session PT - End of Session Equipment Utilized During Treatment: Gait belt;Left knee immobilizer Activity Tolerance: Patient tolerated treatment well (still short distance, but improving) Patient left: in chair;with call bell in reach General Behavior During Session: [facility] for tasks performed Cognition: [facility] for tasks performed  Van Clines [facility] [place], [place]  [phone]  06/20/2011, 9:39 AM

## 2011-06-20 NOTE — Progress Notes (Signed)
06/20/2011 Physical Therapy Note:  Continuing to follow;   Pt continues to require +2 Tot assist for functional mobility;  Unless she progresses quickly, or husband and family can provide 24 hour +2 Tot assistance, will need to continue to consider SNF for rehab;  Recommend re-consulting SW to continue the consideration of SNF for rehab;  Thanks, [place], [place] [phone]

## 2011-06-21 LAB — HEMOGLOBIN AND HEMATOCRIT, BLOOD
HCT: 20.9 % — ABNORMAL LOW (ref 36.0–46.0)
Hemoglobin: 7 g/dL — ABNORMAL LOW (ref 12.0–15.0)

## 2011-06-21 LAB — TYPE AND SCREEN
ABO/RH(D): O POS
Antibody Screen: NEGATIVE
Unit division: 0
Unit division: 0

## 2011-06-21 LAB — PROTIME-INR
INR: 1.05 (ref 0.00–1.49)
Prothrombin Time: 13.9 seconds (ref 11.6–15.2)

## 2011-06-21 MED ORDER — WARFARIN SODIUM 10 MG PO TABS
10.0000 mg | ORAL_TABLET | Freq: Once | ORAL | Status: AC
  Administered 2011-06-21: 10 mg via ORAL
  Filled 2011-06-21: qty 1

## 2011-06-21 MED ORDER — PATIENT'S GUIDE TO USING COUMADIN BOOK
Freq: Once | Status: AC
  Administered 2011-06-21: 20:00:00
  Filled 2011-06-21: qty 1

## 2011-06-21 MED ORDER — FERROUS SULFATE 325 (65 FE) MG PO TABS
325.0000 mg | ORAL_TABLET | Freq: Three times a day (TID) | ORAL | Status: DC
  Administered 2011-06-21 – 2011-06-22 (×4): 325 mg via ORAL
  Filled 2011-06-21 (×7): qty 1

## 2011-06-21 NOTE — Progress Notes (Signed)
Occupational Therapy Treatment Patient Details Name: [name] MRN: [id number] DOB: 1955-08-29 Today's Date: 06/21/2011  OT Assessment/Plan OT Assessment/Plan Comments on Treatment Session: Pt progressing towards goals. Plans to d/c home tomorrow. OT Plan: Discharge plan remains appropriate OT Frequency: Min 2X/week Follow Up Recommendations: Home health OT Equipment Recommended: Rolling walker with 5" wheels;3 in 1 bedside comode;Other (comment) OT Goals Acute Rehab OT Goals OT Goal Formulation: With patient Time For Goal Achievement: 7 days ADL Goals Pt Will Perform Grooming: with supervision;with min assist;Standing at sink ADL Goal: Grooming - Progress: Not addressed Pt Will Perform Upper Body Dressing: with modified independence;Sitting, chair ADL Goal: Upper Body Dressing - Progress: Not addressed Pt Will Perform Lower Body Dressing: with modified independence;with min assist;with adaptive equipment;Sit to stand from chair ADL Goal: Lower Body Dressing - Progress: Progressing toward goals Pt Will Perform Tub/Shower Transfer: with min assist;with DME;Transfer tub bench ADL Goal: Tub/Shower Transfer - Progress: Not addressed Additional ADL Goal #1: Pt will perform toileting transfers, hygeine and clothing negotiation w/ supervision-min guard assist to wide 3:1 over toilet ADL Goal: Additional Goal #1 - Progress: Not addressed  OT Treatment Precautions/Restrictions  Precautions Precautions: Knee Restrictions Weight Bearing Restrictions: Yes LLE Weight Bearing: Partial weight bearing LLE Partial Weight Bearing Percentage or Pounds: 50%   ADL ADL Lower Body Dressing: Simulated;Moderate assistance Where Assessed - Lower Body Dressing: Sit to stand from bed;Supported ADL Comments: Pt declined performing ADLs because she reports having already done everything that morning.  Plan to return tomorrow morning to assist with morning BADLs. Mobility  Bed Mobility Bed  Mobility: Yes Sit to Supine - Left: 4: Min assist Sit to Supine - Left Details (indicate cue type and reason): Min assist for LLE support into bed Transfers Transfers: Yes Sit to Stand: 4: Min assist;From bed;With upper extremity assist Sit to Stand Details (indicate cue type and reason): A for stability and balance. Cues for safe positioning.  Stand to Sit: 4: Min assist;To bed;With upper extremity assist Stand to Sit Details: A to control descent onto bed. Cues for safe hand placement Exercises    End of Session OT - End of Session Equipment Utilized During Treatment: Left knee immobilizer Activity Tolerance: Patient tolerated treatment well Patient left: in bed;with call bell in reach;with family/visitor present General Behavior During Session: [facility] for tasks performed Cognition: [facility] for tasks performed  Cipriano Mile  06/21/2011, 4:52 PM

## 2011-06-21 NOTE — Progress Notes (Signed)
Physical Therapy Treatment Patient Details Name: ANALYCE TAVARES MRN: [id number] DOB: 12/31/1955 Today's Date: 06/21/2011  PT Assessment/Plan  PT - Assessment/Plan Comments on Treatment Session: Pt able to practice stairs today with husband. Pt will need to practice again to ensure carry over and confidence to complete upon DC home.  PT Plan: Discharge plan remains appropriate PT Frequency: 7X/week Follow Up Recommendations: Home health PT Equipment Recommended: Rolling walker with 5" wheels;3 in 1 bedside comode;Other (comment) (bariatric) PT Goals  Acute Rehab PT Goals PT Goal: Supine/Side to Sit - Progress: Met PT Goal: Sit to Stand - Progress: Met PT Goal: Stand to Sit - Progress: Progressing toward goal PT Goal: Ambulate - Progress: Met PT Goal: Up/Down Stairs - Progress: Progressing toward goal PT Goal: Perform Home Exercise Program - Progress: Not met  PT Treatment Precautions/Restrictions  Precautions Precautions: Knee Precaution Booklet Issued: Yes (comment) (TKR exercises and no pillow under knee instructions) Precaution Comments: pt. is at increased risk for fall due to obesity and decreased mobility Required Braces or Orthoses: Yes Knee Immobilizer: On at all times Restrictions Weight Bearing Restrictions: Yes LLE Weight Bearing: Partial weight bearing LLE Partial Weight Bearing Percentage or Pounds: 50% Other Position/Activity Restrictions: requires increased time for tasks/activity Mobility (including Balance) Bed Mobility Supine to Sit: Other (comment) (MinGuard A) Supine to Sit Details (indicate cue type and reason): Cues for positioning and hand placement Transfers Transfers: Yes Sit to Stand: 3: Mod assist;With upper extremity assist;From bed;From chair/3-in-1;With armrests Sit to Stand Details (indicate cue type and reason): A for balance and stability. Cues for safe hand placement Stand to Sit: To chair/3-in-1;With armrests;3: Mod assist;With upper  extremity assist Stand to Sit Details: A to control descent into chair. Cues for LLE placement Ambulation/Gait Ambulation/Gait: Yes Ambulation/Gait Assistance: 4: Min assist Ambulation/Gait Assistance Details (indicate cue type and reason): A for safety. Cues for gait sequence and ensure 50% PWB Ambulation Distance (Feet): 20 Feet Assistive device: Rolling walker Gait Pattern: Step-to pattern;Antalgic Gait velocity: decreased Stairs: Yes Stairs Assistance: 3: Mod assist Stairs Assistance Details (indicate cue type and reason): A with RW and balance. Cues for technique Stair Management Technique: No rails;With walker;Backwards Number of Stairs: 2     Exercise    End of Session PT - End of Session Activity Tolerance: Patient tolerated treatment well Patient left: in chair;with call bell in reach;with family/visitor present Nurse Communication: Mobility status for transfers;Mobility status for ambulation General Behavior During Session: [facility] for tasks performed Cognition: [facility] for tasks performed  Sahvannah Rieser, Adline Potter 06/21/2011, 1:10 PM 06/21/2011 Fredrich Birks PTA [phone] pager [phone] office

## 2011-06-21 NOTE — Progress Notes (Signed)
ANTICOAGULATION CONSULT NOTE - Follow Up Consult  Pharmacy Consult for warfarin Indication: VTE prophylaxis  Allergies  Allergen Reactions  . Toradol Shortness Of Breath and Palpitations  . Latex Other (See Comments)    Powdered. Confirm type of reaction with patient.    Vital Signs: Temp: 98.7 F (37.1 C) (12/11 0600) Temp src: Oral (12/11 0600) BP: 127/74 mmHg (12/11 0600) Pulse Rate: 82  (12/11 0600)  Labs:  Basename 06/21/11 0339 06/20/11 1220  HGB 7.0* 6.2*  HCT 20.9* 19.5*  PLT -- --  APTT -- --  LABPROT 13.9 --  INR 1.05 --  HEPARINUNFRC -- --  CREATININE -- --  CKTOTAL -- --  CKMB -- --  TROPONINI -- --   Estimated Creatinine Clearance: 119 ml/min (by C-G formula based on Cr of 0.69).   Medications:  Scheduled:    . coumadin book   Does not apply Once  . docusate sodium  100 mg Oral BID  . ferrous sulfate  325 mg Oral TID WC  . furosemide  20 mg Oral Daily  . hydrochlorothiazide  25 mg Oral Daily  . lisinopril  20 mg Oral Daily  . warfarin  10 mg Oral ONCE-1800  . warfarin   Does not apply Once    Assessment: 55 yo Left knee replacement with INR of 1.05 following 10mg  dose of warfarin.  No bleeding noted, etc. Warfarin score calculates to 9.  Goal of Therapy:  INR 2-3   Plan:  Coumadin 10mg  Check INR in am for tomorrow's dose. Provide patient education.  Walden Field B 06/21/2011,9:37 AM

## 2011-06-21 NOTE — Progress Notes (Signed)
Patient ID: [name], female   DOB: January 29, 1956, 55 y.o.   MRN: [id number] Afebrile,H&H 6.2&19,5 TO 7&20.9 AFTER TRANSFUSION. INR 1.05 ,PAIV UNDER CONTROL, PATIENT WORKED SOME WITH STEPS AND WILL REPEAT LATER TODAY. PLAN DISCHARGE IN AM.

## 2011-06-21 NOTE — Progress Notes (Signed)
Physical Therapy Treatment Patient Details Name: [name] MRN: [id number] DOB: 06-02-1956 Today's Date: 06/21/2011  PT Assessment/Plan  PT - Assessment/Plan Comments on Treatment Session: Pt didn't review TherEx as OT in to treat patient. Will review HEP tomorrow. Pt decided to use ambulance transfer home.  PT Plan: Discharge plan remains appropriate PT Frequency: 7X/week Follow Up Recommendations: Home health PT Equipment Recommended: Rolling walker with 5" wheels;3 in 1 bedside comode;Other (comment) (bari) PT Goals  Acute Rehab PT Goals PT Goal: Supine/Side to Sit - Progress: Met PT Goal: Sit to Stand - Progress: Met PT Goal: Stand to Sit - Progress: Met PT Goal: Ambulate - Progress: Met PT Goal: Up/Down Stairs - Progress: Partly met PT Goal: Perform Home Exercise Program - Progress: Not met  PT Treatment Precautions/Restrictions  Precautions Precautions: Knee Precaution Booklet Issued: Yes (comment) (TKR exercises and no pillow under knee instructions) Precaution Comments: pt. is at increased risk for fall due to obesity and decreased mobility Required Braces or Orthoses: Yes Knee Immobilizer: On at all times Restrictions Weight Bearing Restrictions: Yes LLE Weight Bearing: Partial weight bearing LLE Partial Weight Bearing Percentage or Pounds: 50% Other Position/Activity Restrictions: requires increased time for tasks/activity Mobility (including Balance) Bed Mobility Supine to Sit: 5: Supervision;With rails Supine to Sit Details (indicate cue type and reason): Cues for positioning and hand placement Transfers Transfers: Yes Sit to Stand: 4: Min assist;From bed;With upper extremity assist Sit to Stand Details (indicate cue type and reason): A for stability and balance. Cues for safe positioning.  Stand to Sit: 4: Min assist;To bed;With upper extremity assist Stand to Sit Details: A to control descent onto bed. Cues for safe hand  placement Ambulation/Gait Ambulation/Gait: Yes Ambulation/Gait Assistance: Other (comment) (MinGuard A) Ambulation/Gait Assistance Details (indicate cue type and reason): Cues for gait sequence. Not evidence of buckling without immobilizer Ambulation Distance (Feet): 32 Feet Assistive device: Rolling walker Gait Pattern: Step-to pattern;Antalgic;Decreased stride length Gait velocity: decreased Stairs: No Exercise    End of Session PT - End of Session Activity Tolerance: Patient tolerated treatment well Patient left: in bed;Other (comment) (with OT) Nurse Communication: Mobility status for transfers;Mobility status for ambulation General Behavior During Session: [facility] for tasks performed Cognition: [facility] for tasks performed  [place], Adline Potter 06/21/2011, 2:51 PM  06/21/2011 Fredrich Birks PTA [phone] pager [phone] office

## 2011-06-22 LAB — PROTIME-INR
INR: 1.19 (ref 0.00–1.49)
Prothrombin Time: 15.4 seconds — ABNORMAL HIGH (ref 11.6–15.2)

## 2011-06-22 MED ORDER — METHOCARBAMOL 500 MG PO TABS: 500.0000 mg | ORAL_TABLET | Freq: Three times a day (TID) | ORAL | Status: AC

## 2011-06-22 MED ORDER — OXYCODONE-ACETAMINOPHEN 5-325 MG PO TABS: 1.0000 | ORAL_TABLET | ORAL | Status: AC | PRN

## 2011-06-22 MED ORDER — FERROUS SULFATE 325 (65 FE) MG PO TABS: 325.0000 mg | ORAL_TABLET | Freq: Three times a day (TID) | ORAL | Status: DC

## 2011-06-22 MED ORDER — HYDROCHLOROTHIAZIDE 25 MG PO TABS: 25.0000 mg | ORAL_TABLET | Freq: Every day | ORAL | Status: DC

## 2011-06-22 NOTE — Discharge Summary (Signed)
NAMESHATORI, BERTUCCI NO.:  [id number]  MEDICAL RECORD NO.:  [id number]  LOCATION:  5040                         FACILITY:  [facility]  PHYSICIAN:  Myrtie Neither, MD      DATE OF BIRTH:  01-02-1956  DATE OF ADMISSION:  06/16/2011 DATE OF DISCHARGE:  06/22/2011                              DISCHARGE SUMMARY   ADMITTING DIAGNOSES: 1. Degenerative joint disease, left knee. 2. History of hyperlipidemia. 3. Chronic anemia. 4. Hypertension. 5. Asthma.  DISCHARGE DIAGNOSES: 1. Degenerative joint disease, left knee. 2. History of hyperlipidemia. 3. Chronic anemia. 4. Hypertension. 5. Asthma. 6. Acute blood loss anemia postop.  COMPLICATIONS:  None.  INFECTIONS:  None.  OPERATION:  Left total knee arthroplasty.  PERTINENT HISTORY:  This is a 55 year old female followed in the office for degenerative joint disease involving the left knee.  The patient has been treated with anti-inflammatories, use of cane, as well as pain medication.  The patient basically __________ left knee, debilitated her to the point of reduced activity, difficulty walking and getting up from the sitting position with pain both at rest as well as on ambulation. Pertinent physical was that on the left knee genu valgum, crepitus both medial and lateral compartment, +2 effusion in the patellofemoral joint. Range of motion good but limited.  Negative Homans test.  X-rays revealed loss of joint space both medially and laterally with osteophytes, spurs, and sclerosis.  HOSPITAL COURSE:  The patient underwent preop laboratory CBC, EKG, chest x-ray, PT, PTT, platelet count, CMET, and UA.  The patient's lab was found to be stable enough to undergo surgery.  The patient underwent left total knee arthroplasty, tolerated the procedure quite well. Postoperatively had acute blood loss anemia and received 2 units of packed cells.  The patient was started on physical therapy, partial weightbearing 50% on  the left side with the use of walker.  The patient had pre and postop IV antibiotics and is on prophylactic Coumadin.  The patient's wounds healing quite well.  Pain is brought under control with use of Percocet two q.4 p.r.n. for pain, daily dressing changes to the left knee, physical therapy with use of partial weightbearing 50% on the left side, and had use of CPM.  The patient is stable enough to be discharged home with home health and physical therapy arranged. Presently, the patient is afebrile.  Blood pressure is under control.  H and H 7 and 20.9.  MEDICATIONS:  The patient is on: 1. Ferrous sulfate 325 mg t.i.d. 2. Lasix 20 mg daily. 3. Hydrochlorothiazide 25 mg daily. 4. Lisinopril 20 mg daily. 5. Coumadin presently 7.5 mg daily with INR stable. 6. The patient also on Colace 100 mg b.i.d. 7. The patient uses albuterol 2 puffs q.6 p.r.n. 8. Robaxin 500 one b.i.d. 9. Percocet two q.4 p.r.n. for pain.  The patient will have home health and physical therapy arranged and to return to the office in 1 week.  The patient is being discharged in stable and satisfactory condition.     Myrtie Neither, MD     AC/MEDQ  D:  06/22/2011  T:  06/22/2011  Job:  [id number]

## 2011-06-22 NOTE — Progress Notes (Signed)
Utilization review completed. Lowella Kindley, RN, BSN. 06/22/11 

## 2011-06-22 NOTE — Progress Notes (Signed)
Received call from [place], [place].  States that patient is being d/c'd home today and will require EMS transportation. She has steps to get into her home and she is unable to fully manage steps at this time.  EMS form completed; nursing or SW will call for transport when d/c is complete. No further SW needs identified.  Darylene Price, BSW, 06/22/2011 10:40 AM

## 2011-06-22 NOTE — Progress Notes (Signed)
Physical Therapy Treatment Patient Details Name: [name] MRN: [id number] DOB: 04-Feb-1956 Today's Date: 06/22/2011  PT Assessment/Plan  PT - Assessment/Plan Comments on Treatment Session: Pt progressing very well today. Still c/o slight dizziness but subsides quickly. Pt awaiting ambulance transport home.  PT Plan: Discharge plan remains appropriate PT Frequency: 7X/week Follow Up Recommendations: Home health PT Equipment Recommended: Rolling walker with 5" wheels;3 in 1 bedside comode PT Goals  Acute Rehab PT Goals PT Goal: Supine/Side to Sit - Progress: Met PT Goal: Sit to Supine/Side - Progress: Met PT Goal: Sit to Stand - Progress: Met PT Goal: Stand to Sit - Progress: Met PT Goal: Ambulate - Progress: Met PT Goal: Up/Down Stairs - Progress: Not met PT Goal: Perform Home Exercise Program - Progress: Met  PT Treatment Precautions/Restrictions  Precautions Precautions: Knee Precaution Booklet Issued: Yes (comment) (TKR exercises and no pillow under knee instructions) Precaution Comments: pt. is at increased risk for fall due to obesity and decreased mobility Required Braces or Orthoses: Yes Knee Immobilizer: On at all times Restrictions Weight Bearing Restrictions: Yes LLE Weight Bearing: Partial weight bearing LLE Partial Weight Bearing Percentage or Pounds: 50% Other Position/Activity Restrictions: requires increased time for tasks/activity Mobility (including Balance) Bed Mobility Bed Mobility: Yes Supine to Sit: 5: Supervision;HOB flat Supine to Sit Details (indicate cue type and reason): Cues for positioning Sit to Supine - Left: 6: Modified independent (Device/Increase time);HOB flat Transfers Sit to Stand: Other (comment);From bed;With upper extremity assist (MinGuard A) Sit to Stand Details (indicate cue type and reason): Cues for safe technique Stand to Sit: 5: Supervision;To bed;With upper extremity assist Stand to Sit Details: Cues for safe positioning  and LE placement Ambulation/Gait Ambulation/Gait: Yes Ambulation/Gait Assistance: Other (comment) (MinGuard A) Ambulation/Gait Assistance Details (indicate cue type and reason): Cues for posture.  Ambulation Distance (Feet): 50 Feet Assistive device: Rolling walker Gait Pattern: Step-to pattern;Trunk flexed Stairs: No    Exercise  Total Joint Exercises Quad Sets: AROM;Left;10 reps Short Arc Quad: AROM;Left;10 reps Heel Slides: AROM;Left;10 reps Hip ABduction/ADduction: AROM;Left;10 reps Straight Leg Raises: AROM;Left;10 reps End of Session PT - End of Session Activity Tolerance: Patient tolerated treatment well Patient left: in bed;with call bell in reach Nurse Communication: Mobility status for transfers;Mobility status for ambulation General Behavior During Session: [facility] for tasks performed Cognition: [facility] for tasks performed  Brently Voorhis, Adline Potter 06/22/2011, 12:39 PM 06/22/2011 Fredrich Birks PTA [phone] pager [phone] office

## 2011-06-22 NOTE — Progress Notes (Signed)
Occupational Therapy Treatment Patient Details Name: [name] MRN: [id number] DOB: [birth year] Today's Date: 06/22/2011  OT Assessment/Plan OT Assessment/Plan Comments on Treatment Session: Pt progressing towards goals. Plans to d/c home today OT Plan: Discharge plan remains appropriate OT Frequency: Min 2X/week Follow Up Recommendations: Home health OT Equipment Recommended: Rolling walker with 5" wheels;3 in 1 bedside comode;Other (comment) OT Goals Acute Rehab OT Goals OT Goal Formulation: With patient Time For Goal Achievement: 7 days ADL Goals Pt Will Perform Grooming: with supervision;with min assist;Standing at sink ADL Goal: Grooming - Progress: Not addressed Pt Will Perform Upper Body Dressing: with modified independence;Sitting, chair ADL Goal: Upper Body Dressing - Progress: Progressing toward goals Pt Will Perform Lower Body Dressing: with modified independence;with min assist;with adaptive equipment;Sit to stand from chair ADL Goal: Lower Body Dressing - Progress: Progressing toward goals Pt Will Perform Tub/Shower Transfer: with min assist;with DME;Transfer tub bench ADL Goal: Tub/Shower Transfer - Progress: Not addressed Additional ADL Goal #1: Pt will perform toileting transfers, hygeine and clothing negotiation w/ supervision-min guard assist to wide 3:1 over toilet ADL Goal: Additional Goal #1 - Progress: Met  OT Treatment Precautions/Restrictions  Precautions Precautions: Knee Restrictions Weight Bearing Restrictions: Yes LLE Weight Bearing: Partial weight bearing LLE Partial Weight Bearing Percentage or Pounds: 50%   ADL ADL Upper Body Dressing: Performed;Set up Upper Body Dressing Details (indicate cue type and reason): Pt donned shirt sitting EOB. Pt donned house coat standing. Where Assessed - Upper Body Dressing: Standing;Unsupported;Sitting, bed Lower Body Dressing: Performed;Minimal assistance Lower Body Dressing Details (indicate cue type and  reason): Pt donned shorts Where Assessed - Lower Body Dressing: Sit to stand from bed;Unsupported Toilet Transfer: Simulated;Other (comment) (min guard) Toilet Transfer Method: Ambulating Mobility  Bed Mobility Bed Mobility: Yes Supine to Sit: 5: Supervision;With rails Sit to Supine - Left: 6: Modified independent (Device/Increase time);HOB flat;With rail Transfers Transfers: Yes Sit to Stand: Other (comment);4: Min assist;From bed;With upper extremity assist (min guard) Sit to Stand Details (indicate cue type and reason): Min guard for safety Stand to Sit: 5: Supervision;To bed;With upper extremity assist Exercises    End of Session OT - End of Session Activity Tolerance: Patient tolerated treatment well Patient left: in bed;with call bell in reach;with family/visitor present General Behavior During Session: [facility] for tasks performed Cognition: [facility] for tasks performed  Cipriano Mile  06/22/2011, 11:43 AM 06/22/2011 Cipriano Mile OTR/L Pager [phone] Office [phone]

## 2011-07-12 HISTORY — PX: JOINT REPLACEMENT: SHX530

## 2011-07-19 ENCOUNTER — Other Ambulatory Visit: Payer: Self-pay | Admitting: Orthopedic Surgery

## 2011-08-01 ENCOUNTER — Other Ambulatory Visit: Payer: Self-pay | Admitting: Orthopedic Surgery

## 2011-08-01 ENCOUNTER — Ambulatory Visit
Admission: RE | Admit: 2011-08-01 | Discharge: 2011-08-01 | Disposition: A | Discharge status: Home / Self Care | Payer: Medicaid Other | Source: Ambulatory Visit | Attending: Orthopedic Surgery | Admitting: Orthopedic Surgery

## 2011-08-01 DIAGNOSIS — R52 Pain, unspecified: Secondary | ICD-10-CM

## 2011-08-25 ENCOUNTER — Encounter: Payer: Self-pay | Admitting: Internal Medicine

## 2011-10-26 ENCOUNTER — Other Ambulatory Visit: Payer: Self-pay | Admitting: *Deleted

## 2011-10-26 MED ORDER — LISINOPRIL-HYDROCHLOROTHIAZIDE 20-25 MG PO TABS: 1.0000 | ORAL_TABLET | Freq: Every day | ORAL | Status: DC

## 2011-10-26 NOTE — Telephone Encounter (Signed)
Please call pt and let her know that I will not provide additional refills until she comes in to see [place]. She was last seen in 02/2011 and recommended 1-2 month followup, 6 months later we still haven't seen her. Her blood pressure has remained uncontrolled on multiple visits.  Thank you.  [name], D.O.

## 2011-10-26 NOTE — Telephone Encounter (Signed)
Pt was called; no answer - message for pt to call and schedule an appt.

## 2011-10-28 ENCOUNTER — Encounter: Payer: Self-pay | Admitting: Internal Medicine

## 2011-11-17 ENCOUNTER — Encounter: Payer: Self-pay | Admitting: Internal Medicine

## 2011-11-17 ENCOUNTER — Ambulatory Visit (INDEPENDENT_AMBULATORY_CARE_PROVIDER_SITE_OTHER): Payer: Self-pay | Admitting: Internal Medicine

## 2011-11-17 VITALS — BP 156/88 | HR 89 | Temp 98.5°F | Ht 64.0 in | Wt 336.1 lb

## 2011-11-17 DIAGNOSIS — Z0000 Encounter for general adult medical examination without abnormal findings: Secondary | ICD-10-CM

## 2011-11-17 DIAGNOSIS — D649 Anemia, unspecified: Secondary | ICD-10-CM

## 2011-11-17 DIAGNOSIS — E785 Hyperlipidemia, unspecified: Secondary | ICD-10-CM

## 2011-11-17 DIAGNOSIS — R911 Solitary pulmonary nodule: Secondary | ICD-10-CM

## 2011-11-17 DIAGNOSIS — J984 Other disorders of lung: Secondary | ICD-10-CM

## 2011-11-17 DIAGNOSIS — Z6841 Body Mass Index (BMI) 40.0 and over, adult: Secondary | ICD-10-CM

## 2011-11-17 DIAGNOSIS — I10 Essential (primary) hypertension: Secondary | ICD-10-CM

## 2011-11-17 MED ORDER — LISINOPRIL-HYDROCHLOROTHIAZIDE 20-12.5 MG PO TABS: 2.0000 | ORAL_TABLET | Freq: Every day | ORAL | Status: DC

## 2011-11-17 NOTE — Patient Instructions (Signed)
   Please follow-up at the clinic in 6 weeks, at which time we will reevaluate your blood pressure, diet - OR, please follow-up in the clinic sooner if needed.  There have been changes in your medications:  STOP Lisinopril-HCTZ 20-25 mg daily  START Lisinopril-HCTZ 20-12.5mg  - 2 tablets daily   I am checking labs today, if they are abnormal I will give you a call.  Please schedule for your PAP at next visit.  If you have been started on new medication(s), and you develop symptoms concerning for allergic reaction, including, but not limited to, throat closing, tongue swelling, rash, please stop the medication immediately and call the clinic at [phone], and go to the ER.  If symptoms worsen, or new symptoms arise, please call the clinic or go to the ER.  Please bring all of your medications in a bag to your next visit.

## 2011-11-17 NOTE — Progress Notes (Signed)
Subjective:    Patient ID: [name] female   DOB: Apr 14, 1956 56 y.o.   MRN: [id number]  HPI: Pt is a 56yo female with PMHx of HLD, iron-deficiency anemia, DJD, morbid obesity who presents to clinic today for the following:  1) HTN - Patient does check blood pressure regularly at home - usually gets 148/80. Currently taking lasix and lisinopril-HCTZ (20-25mg  daily). Patient misses doses 2 x per week on average. denies headaches, chest pain, shortness of breath.  does request refills today.   2) HLD - currently not on any medications. denies chest pain, difficulty breathing, palpitations, tachycardia, and muscle pains.   3) Lung nodule - Patient has history of lung nodules, first noted in 04/2010 on a CT abdomen, with repeat CT abdomen since that time (last in 12/2010) showing stability of size since that time. Denies shortness of breath, cough, weight loss, night sweats. Used to smoke, quit 17-18 years ago.  4) Anemia - patient has a chronic anemia, for which she was previously on oral iron supplementation daily. However recent TKR, she was instructed to stop her iron supplementation because no longer needed. - she confirms frequent tiredness, occasional positional lightheadedness, but no observed vaginal or stool blood loss.    Review of Systems: Per HPI.  Current Outpatient Medications: Medication Sig  . albuterol (PROVENTIL HFA;VENTOLIN HFA) 108 (90 BASE) MCG/ACT inhaler Inhale 2 puffs into the lungs every 6 (six) hours as needed for wheezing or shortness of breath.  . furosemide (LASIX) 40 MG tablet Take 0.5 tablets (20 mg total) by mouth daily.  [name] ibuprofen (ADVIL,MOTRIN) 800 MG tablet Take 800 mg by mouth at bedtime as needed. For pain  . lisinopril-hydrochlorothiazide (PRINZIDE,ZESTORETIC) 20-25 MG per tablet Take 1 tablet by mouth daily.  . methocarbamol (ROBAXIN) 500 MG tablet Take 500 mg by mouth 2 (two) times daily.  [name] docusate sodium (COLACE) 100 MG capsule Take 1 capsule  (100 mg total) by mouth 2 (two) times daily.    Allergies: Allergen Reactions  . Ketorolac Tromethamine Shortness Of Breath and Palpitations  . Latex Other (See Comments)    Powdered. Confirm type of reaction with patient.    Past Medical History  Diagnosis Date  . Hypertension   . Hyperlipidemia   . Asthma   . Obesity   . Lower extremity edema     Chronic. 2D echo (2005) - EF 65%.  . Seasonal allergies   . Abnormal EKG 06/2003    History of inverted T waves V1-V3. Normal 2D echo (07/23/2003): LVEF 65%.  . History of multiple pulmonary nodules     Incidental finding: CT Abd/ Pelvis (04/2010) - Several small lower lobe lung nodules, including one pure ground-glass pulmonary nodule measuring 8 mm in the left lower lobe.  Recommend follow-up chest CT (IV contrast preferred) in 6 months to document stability. //  CT Abd/ Pelvis (07/2010) -  3 mm RLL and  8 mm LLL nodule stable.  Other nodules unchanged, likely benign.  . 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 Followed by Sports Medicine, Dr. Jennette Kettle.  . Incarcerated ventral hernia 04/2010    Noted on CT Abd/ pelvis (04/2010). Patient now s/p ventral hernia repair by Dr. Gerrit Friends (12/2010)  . Anemia     BL Hgb 11-12. Ferritin 14 - low normal (08/2007). Colonoscopy 2009 - external hemorrhoids (excellent prep). Last anemia panel (12/2010) - Iron  24, TIBC 269,  B12  316, Folate 11.9, Ferritin 73.  [name]  Night sweats   . Dizziness   . Fatigue     loss of sleep  . Wears dentures   . Wears glasses     Past Surgical History  Procedure Date  . Incision and drainage abscess anal 07/2008    I&D and debridgement of anorectal abscess  . Inguinal hernia repair   . Tubal ligation   . Incisional hernia repair 12/2010    Repair of incarcerated ventral incisional hernia with Ethicon mesh patch - performed by Dr. Gerrit Friends.   . Foot surgery 2004     left  . Total knee arthroplasty 06/16/2011    Left TOTAL KNEE ARTHROPLASTY;   Surgeon: Kennieth Rad;  Location: [facility] OR;  Service: Orthopedics;  Laterality: Left;  left total knee arthroplasty     Objective:    Physical Exam: Filed Vitals:   11/17/11 1559  BP: 156/88  Pulse: 89  Temp: 98.5 F (36.9 C)      General: Vital signs reviewed and noted. Well-developed, well-nourished, in no acute distress; alert, appropriate and cooperative throughout examination.  Head: Normocephalic, atraumatic.  Lungs:  Normal respiratory effort. Clear to auscultation BL without crackles or wheezes.  Heart: RRR. S1 and S2 normal without gallop, rubs. (+) murmur.  Abdomen:  BS normoactive. Soft, Nondistended, non-tender.  No masses or organomegaly.  Extremities: No pretibial edema.    Assessment/ Plan:   Case and plan of care discussed with Dr. Margarito Liner.

## 2011-11-18 LAB — COMPREHENSIVE METABOLIC PANEL
ALT: 13 U/L (ref 0–35)
AST: 15 U/L (ref 0–37)
Albumin: 4.3 g/dL (ref 3.5–5.2)
Alkaline Phosphatase: 98 U/L (ref 39–117)
BUN: 13 mg/dL (ref 6–23)
CO2: 25 mEq/L (ref 19–32)
Calcium: 10 mg/dL (ref 8.4–10.5)
Chloride: 107 mEq/L (ref 96–112)
Creat: 0.7 mg/dL (ref 0.50–1.10)
Glucose, Bld: 87 mg/dL (ref 70–99)
Potassium: 4 mEq/L (ref 3.5–5.3)
Sodium: 142 mEq/L (ref 135–145)
Total Bilirubin: 0.5 mg/dL (ref 0.3–1.2)
Total Protein: 6.8 g/dL (ref 6.0–8.3)

## 2011-11-18 LAB — CBC WITH DIFFERENTIAL/PLATELET
Basophils Absolute: 0 10*3/uL (ref 0.0–0.1)
Basophils Relative: 1 % (ref 0–1)
Eosinophils Absolute: 0.2 10*3/uL (ref 0.0–0.7)
Eosinophils Relative: 5 % (ref 0–5)
HCT: 37.7 % (ref 36.0–46.0)
Hemoglobin: 11.7 g/dL — ABNORMAL LOW (ref 12.0–15.0)
Lymphocytes Relative: 50 % — ABNORMAL HIGH (ref 12–46)
Lymphs Abs: 2.3 10*3/uL (ref 0.7–4.0)
MCH: 27.5 pg (ref 26.0–34.0)
MCHC: 31 g/dL (ref 30.0–36.0)
MCV: 88.5 fL (ref 78.0–100.0)
Monocytes Absolute: 0.3 10*3/uL (ref 0.1–1.0)
Monocytes Relative: 7 % (ref 3–12)
Neutro Abs: 1.8 10*3/uL (ref 1.7–7.7)
Neutrophils Relative %: 38 % — ABNORMAL LOW (ref 43–77)
Platelets: 318 10*3/uL (ref 150–400)
RBC: 4.26 MIL/uL (ref 3.87–5.11)
RDW: 15.6 % — ABNORMAL HIGH (ref 11.5–15.5)
WBC: 4.7 10*3/uL (ref 4.0–10.5)

## 2011-11-18 LAB — ANEMIA PANEL
%SAT: 17 % — ABNORMAL LOW (ref 20–55)
ABS Retic: 85.2 10*3/uL (ref 19.0–186.0)
Ferritin: 27 ng/mL (ref 10–291)
Folate: 8 ng/mL
Iron: 61 ug/dL (ref 42–145)
RBC.: 4.26 MIL/uL (ref 3.87–5.11)
Retic Ct Pct: 2 % (ref 0.4–2.3)
TIBC: 351 ug/dL (ref 250–470)
UIBC: 290 ug/dL (ref 125–400)
Vitamin B-12: 330 pg/mL (ref 211–911)

## 2011-11-18 LAB — LIPID PANEL
Cholesterol: 215 mg/dL — ABNORMAL HIGH (ref 0–200)
HDL: 48 mg/dL (ref >39)
LDL Cholesterol: 145 mg/dL — ABNORMAL HIGH (ref 0–99)
Total CHOL/HDL Ratio: 4.5 Ratio
Triglycerides: 111 mg/dL (ref <150)
VLDL: 22 mg/dL (ref 0–40)

## 2011-11-21 ENCOUNTER — Encounter: Payer: Self-pay | Admitting: Internal Medicine

## 2011-11-21 MED ORDER — PRAVASTATIN SODIUM 20 MG PO TABS: 20.0000 mg | ORAL_TABLET | Freq: Every evening | ORAL | Status: DC

## 2011-11-21 MED ORDER — FERROUS SULFATE 325 (65 FE) MG PO TABS: 325.0000 mg | ORAL_TABLET | Freq: Two times a day (BID) | ORAL | Status: DC

## 2011-11-21 NOTE — Assessment & Plan Note (Addendum)
Assessment: Patient has a hx of multiple pulmonary nodules, first recognized in 04/2010, which at that time indicated one pure ground glass nodule measuring 8mm in LLL. Subsequent imaging in 07/2010 showed again multiple nodules, stable right 3mm nodule and stable 8mm LLL nodule. Since that time, she has not had additional follow-up of these nodules.  Patient does have a hx of smoking, quit several years ago. She denies any recent weight loss, general malaise, cough, etc. Given her tobacco abuse hx and ground glass nodules noted on CT abdomen in the past, she will need to have continued evaluation of her nodules, especially of the ground glass nodule.  She will need CT chest with IV contrast at least for the first one, and will require subsequent monitoring at 36 months (04/2013), will have to follow-up with CT now help to determine if additional studies need to have contrast or not (based on stability of lesion and/ or mediastinal involvement).  Plan:      Will order CT chest with IV contrast.   Patient is explained that we need a CT chest, but she cannot afford at this time, she has to still get her orange card renewed.   She is explained that she must get this done ASAP.  Patient expresses understanding of need for follow-up with Rudell Cobb for orange card renewal.

## 2011-11-21 NOTE — Assessment & Plan Note (Signed)
Pertinent Labs: Liver Function Tests:    Component Value Date/Time   AST 15 11/17/2011 1656   ALT 13 11/17/2011 1656   ALKPHOS 98 11/17/2011 1656   BILITOT 0.5 11/17/2011 1656   PROT 6.8 11/17/2011 1656   ALBUMIN 4.3 11/17/2011 1656    Lipid Panel:     Component Value Date/Time   CHOL 215* 11/17/2011 1656   TRIG 111 11/17/2011 1656   HDL 48 11/17/2011 1656   CHOLHDL 4.5 11/17/2011 1656   VLDL 22 11/17/2011 1656   LDLCALC 145* 11/17/2011 1656     Assessment: Disease Control: controlled  Progress toward goals: at goal  Barriers to meeting goals: no barriers identified    Not currently on therapy. Her LDL goal is < 160 mg/dL currently. However, in 4 months, when she turns 56 yo, will have 2 cardiac risk factors (HTN, age), and goal will be revised to LDL < 130 mg/dL.   Therefore, I think it is reasonable to go ahead and start therapy at this time.   Patient is not fasting today.    Plan:  Start Pravastatin 20 mg daily.  Recheck lipid panel in 6 weeks.

## 2011-11-21 NOTE — Assessment & Plan Note (Signed)
Assessment: Patient is aware of need for weight loss. She previously was limited by knee pain from her severe OA, which is now s/p left TKR. She hopes that now she will be able to start working towards weight loss.  Plan:      Will defer nutrition referral for now as patient does not have orange card, however, once renewed, will refer to [facility] for nutrition counseling.

## 2011-11-21 NOTE — Progress Notes (Signed)
Quick Note:  Patient still has evidence of iron deficiency component to her anemia, and I feel she still out benefit from iron supplementation. Therefore, she will be started on BID iron. Patient called and informed of this. Expresses understanding. ______

## 2011-11-21 NOTE — Assessment & Plan Note (Signed)
Health Maintenance  Topic Date Due  . Influenza Vaccine  04/10/2012  . Mammogram  11/07/2012  . Pap Smear  09/09/2013  . Colonoscopy  09/25/2017  . Tetanus/tdap  10/13/2020    Assessment:  Due for PAP, prefers to get yearly.  Plan:  Plan next visit.

## 2011-11-21 NOTE — Assessment & Plan Note (Signed)
Pertinent Data: BP Readings from Last 3 Encounters:  11/17/11 156/88  06/22/11 145/78  06/22/11 145/78    Basic Metabolic Panel:    Component Value Date/Time   NA 142 11/17/2011 1656   K 4.0 11/17/2011 1656   CL 107 11/17/2011 1656   CO2 25 11/17/2011 1656   BUN 13 11/17/2011 1656   CREATININE 0.70 11/17/2011 1656   CREATININE 0.69 06/10/2011 1130   GLUCOSE 87 11/17/2011 1656   CALCIUM 10.0 11/17/2011 1656    Assessment: Disease Control: not controlled  Progress toward goals: deteriorated  Barriers to meeting goals: nonadherence to medications occasionally 2x/ week    Currently on Lisinopril-HCTZ 20-25 mg and Lasix 40mg  daily (was started for LE edema), however, I don't think this is an optimal regimen for her as currently on 2 diuretics.   Patient is compliant most of the time with prescribed medications.   Plan:  Increase Lisinopril-HCTZ to 20-12.5mg  - 2 tablets daily  At the next visit, will plan to DC lasix and switch to possible CCB, which may provide better blood pressure control for her.  Will need repeat BMET for next visit.

## 2011-11-21 NOTE — Assessment & Plan Note (Addendum)
Pertinent Labs: H&H:  Ref. Range 06/21/2011 03:39  Hemoglobin Latest Range: 12.0-15.0 g/dL 7.0 (L)  HCT Latest Range: 36.0-46.0 % 20.9 (L)   Anemia Panel:  Ref. Range 01/03/2011 10:11  Iron Latest Range: 42-145 ug/dL 24 (L)  UIBC Latest Range: 125-400 ug/dL 161  TIBC Latest Range: 250-470 ug/dL 096  Saturation Ratios Latest Range: 20-55 % 9 (L)  Ferritin Latest Range: 10-291 ng/mL 73  Folate No range found 11.9    Assessment:  Patient has hx of iron deficiency anemia with last anemia panel in 12/2010 as above. She was previously on iron supplementation, and that was apparently discontinued by her orthopedist within the last few months, as her anemia seemed to stabilize.   She confirms still feeling fatigued, and occasional positional lightheadedness.  Plan:      Repeat CBC and anemia panel today.  Will restart iron supplementation if appropriate, as to be determined by above mentioned labs ==> will restart BID ferrous sulfate.

## 2011-11-22 ENCOUNTER — Encounter: Payer: Self-pay | Admitting: Internal Medicine

## 2011-12-13 ENCOUNTER — Ambulatory Visit (HOSPITAL_COMMUNITY)
Admission: RE | Admit: 2011-12-13 | Discharge: 2011-12-13 | Disposition: A | Discharge status: Home / Self Care | Payer: Self-pay | Source: Ambulatory Visit | Attending: Internal Medicine | Admitting: Internal Medicine

## 2011-12-13 ENCOUNTER — Encounter (HOSPITAL_COMMUNITY): Payer: Self-pay

## 2011-12-13 DIAGNOSIS — Z87891 Personal history of nicotine dependence: Secondary | ICD-10-CM | POA: Insufficient documentation

## 2011-12-13 DIAGNOSIS — E785 Hyperlipidemia, unspecified: Secondary | ICD-10-CM | POA: Insufficient documentation

## 2011-12-13 DIAGNOSIS — I10 Essential (primary) hypertension: Secondary | ICD-10-CM | POA: Insufficient documentation

## 2011-12-13 DIAGNOSIS — R42 Dizziness and giddiness: Secondary | ICD-10-CM | POA: Insufficient documentation

## 2011-12-13 DIAGNOSIS — R5381 Other malaise: Secondary | ICD-10-CM | POA: Insufficient documentation

## 2011-12-13 DIAGNOSIS — J984 Other disorders of lung: Secondary | ICD-10-CM | POA: Insufficient documentation

## 2011-12-13 DIAGNOSIS — R61 Generalized hyperhidrosis: Secondary | ICD-10-CM | POA: Insufficient documentation

## 2011-12-13 MED ORDER — IOHEXOL 300 MG/ML  SOLN
80.0000 mL | Freq: Once | INTRAMUSCULAR | Status: AC | PRN
  Administered 2011-12-13: 80 mL via INTRAVENOUS

## 2011-12-22 NOTE — Progress Notes (Signed)
Quick Note:  Results are stable from last CT scan. Therefore, will continue to follow. Will plan for repeat CT chest with contrast in October 2014, which will be the 36 month mark. The patient was called and notified of these results, and the plan for continued monitoring.  Johnette Abraham, D.O. ______

## 2011-12-29 ENCOUNTER — Encounter: Payer: Self-pay | Admitting: Internal Medicine

## 2012-01-04 ENCOUNTER — Emergency Department (HOSPITAL_COMMUNITY)
Admission: EM | Admit: 2012-01-04 | Discharge: 2012-01-05 | Disposition: A | Discharge status: Home / Self Care | Payer: Self-pay | Attending: Emergency Medicine | Admitting: Emergency Medicine

## 2012-01-04 ENCOUNTER — Emergency Department (HOSPITAL_COMMUNITY): Payer: Self-pay

## 2012-01-04 ENCOUNTER — Encounter (HOSPITAL_COMMUNITY): Payer: Self-pay | Admitting: *Deleted

## 2012-01-04 DIAGNOSIS — T50905A Adverse effect of unspecified drugs, medicaments and biological substances, initial encounter: Secondary | ICD-10-CM

## 2012-01-04 DIAGNOSIS — Z9109 Other allergy status, other than to drugs and biological substances: Secondary | ICD-10-CM | POA: Insufficient documentation

## 2012-01-04 DIAGNOSIS — Z760 Encounter for issue of repeat prescription: Secondary | ICD-10-CM | POA: Insufficient documentation

## 2012-01-04 DIAGNOSIS — I10 Essential (primary) hypertension: Secondary | ICD-10-CM | POA: Insufficient documentation

## 2012-01-04 DIAGNOSIS — J069 Acute upper respiratory infection, unspecified: Secondary | ICD-10-CM | POA: Insufficient documentation

## 2012-01-04 DIAGNOSIS — E785 Hyperlipidemia, unspecified: Secondary | ICD-10-CM | POA: Insufficient documentation

## 2012-01-04 DIAGNOSIS — J45901 Unspecified asthma with (acute) exacerbation: Secondary | ICD-10-CM

## 2012-01-04 MED ORDER — DIPHENHYDRAMINE HCL 50 MG/ML IJ SOLN
INTRAMUSCULAR | Status: AC
  Administered 2012-01-04: 25 mg
  Filled 2012-01-04: qty 1

## 2012-01-04 MED ORDER — PREDNISONE 20 MG PO TABS
60.0000 mg | ORAL_TABLET | Freq: Once | ORAL | Status: AC
  Administered 2012-01-04: 60 mg via ORAL
  Filled 2012-01-04: qty 3

## 2012-01-04 MED ORDER — IPRATROPIUM BROMIDE 0.02 % IN SOLN
0.5000 mg | RESPIRATORY_TRACT | Status: DC | PRN
  Administered 2012-01-04: 0.5 mg via RESPIRATORY_TRACT
  Filled 2012-01-04: qty 2.5

## 2012-01-04 MED ORDER — ALBUTEROL SULFATE (5 MG/ML) 0.5% IN NEBU
10.0000 mg | INHALATION_SOLUTION | RESPIRATORY_TRACT | Status: DC | PRN
  Administered 2012-01-04 – 2012-01-05 (×2): 10 mg via RESPIRATORY_TRACT
  Filled 2012-01-04 (×2): qty 2

## 2012-01-04 MED ORDER — LORAZEPAM 2 MG/ML IJ SOLN
INTRAMUSCULAR | Status: AC
  Administered 2012-01-04: 23:00:00
  Filled 2012-01-04: qty 1

## 2012-01-04 NOTE — ED Provider Notes (Signed)
History     CSN: [id number]  Arrival date & time 01/04/12  1632   First MD Initiated Contact with Patient 01/04/12 2038      Chief Complaint  Patient presents with  . Cough  . Shortness of Breath    Patient is a 56 y.o. female presenting with wheezing. The history is provided by the patient, the spouse and a relative.  Wheezing  The current episode started 5 to 7 days ago. The onset was gradual. The problem occurs continuously. The problem has been gradually worsening. The problem is moderate. The symptoms are relieved by beta-agonist inhalers. The symptoms are aggravated by activity and smoke exposure. Associated symptoms include cough (productive), shortness of breath and wheezing. Pertinent negatives include no chest pain, no chest pressure, no fever, no rhinorrhea and no stridor. The cough's precipitants include smoke and activity (patient is concerned that she has the same infection that her daughter has recently had (she had been diagnosed with pneumonia)). The cough is productive. The cough is relieved by beta-agonist inhalers.    Past Medical History  Diagnosis Date  . Hypertension   . Hyperlipidemia   . Asthma   . Obesity   . Lower extremity edema     Chronic. 2D echo (2005) - EF 65%.  . Seasonal allergies   . Abnormal EKG 06/2003    History of inverted T waves V1-V3. Normal 2D echo (07/23/2003): LVEF 65%.  . History of multiple pulmonary nodules     Incidental finding: CT Abd/ Pelvis (04/2010) - Several small lower lobe lung nodules, including one pure ground-glass pulmonary nodule measuring 8 mm in the left lower lobe.  Recommend follow-up chest CT (IV contrast preferred) in 6 months to document stability. //  CT Abd/ Pelvis (07/2010) -  3 mm RLL and  8 mm LLL nodule stable.  Other nodules unchanged, likely benign.  . 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 Followed by Sports Medicine, Dr. Jennette Kettle.  . Incarcerated ventral hernia 04/2010    Noted on CT  Abd/ pelvis (04/2010). Patient now s/p ventral hernia repair by Dr. Gerrit Friends (12/2010)  . Anemia     BL Hgb 11-12. Ferritin 14 - low normal (08/2007). Colonoscopy 2009 - external hemorrhoids (excellent prep). Last anemia panel (12/2010) - Iron  24, TIBC 269,  B12  316, Folate 11.9, Ferritin 73.  . Night sweats   . Dizziness   . Fatigue     loss of sleep  . Wears dentures   . Wears glasses   . 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 Incision and drainage abscess anal 07/2008    I&D and debridgement of anorectal abscess  . Inguinal hernia repair   . Tubal ligation   . Incisional hernia repair 12/2010    Repair of incarcerated ventral incisional hernia with Ethicon mesh patch - performed by Dr. Gerrit Friends.   . Foot surgery 2004     left  . Total knee arthroplasty 06/16/2011    Left TOTAL KNEE ARTHROPLASTY;  Surgeon: Kennieth Rad;  Location: [facility] OR;  Service: Orthopedics;  Laterality: Left;  left total knee arthroplasty    Family History  Problem Relation Age of Onset  . Diabetes Mother   . Hypertension Mother   . Stroke Mother   . Heart attack Neg Hx   . Dementia Father     History  Substance  Use Topics  . Smoking status: Former Smoker -- 0.5 packs/day for 20 years    Types: Cigarettes    Quit date: 09/10/1998  . Smokeless tobacco: Not on file  . Alcohol Use: No    OB History    Grav Para Term Preterm Abortions TAB SAB Ect Mult Living                  Review of Systems  Constitutional: Positive for chills. Negative for fever, activity change and appetite change.  HENT: Negative for rhinorrhea, neck pain and neck stiffness.   Respiratory: Positive for cough (productive), shortness of breath and wheezing. Negative for chest tightness and stridor.   Cardiovascular: Negative for chest pain and palpitations.  Gastrointestinal: Negative for nausea, vomiting, abdominal pain, diarrhea and constipation.    Genitourinary: Negative for dysuria, flank pain and difficulty urinating.  Musculoskeletal: Positive for back pain (low back at baseline). Negative for myalgias, joint swelling and gait problem.  Skin: Negative for rash and wound.  Neurological: Negative for seizures, syncope, facial asymmetry and light-headedness.  Psychiatric/Behavioral: Negative for confusion and agitation.  All other systems reviewed and are negative.    Allergies  Ketorolac tromethamine and Latex  Home Medications   Current Outpatient Rx  Name Route Sig Dispense Refill  . ALBUTEROL SULFATE HFA 108 (90 BASE) MCG/ACT IN AERS Inhalation Inhale 2 puffs into the lungs every 6 (six) hours as needed. For shortness of breath    . CYCLOBENZAPRINE HCL 10 MG PO TABS Oral Take 10 mg by mouth 2 (two) times daily.    [name] FERROUS SULFATE 325 (65 FE) MG PO TABS Oral Take 1 tablet (325 mg total) by mouth 2 (two) times daily with a meal. 60 tablet 3  . FUROSEMIDE 40 MG PO TABS Oral Take 0.5 tablets (20 mg total) by mouth daily. 30 tablet 3  . IBUPROFEN 800 MG PO TABS Oral Take 800 mg by mouth at bedtime as needed. For pain    . LISINOPRIL-HYDROCHLOROTHIAZIDE 20-12.5 MG PO TABS Oral Take 2 tablets by mouth daily. 60 tablet 3  . METHOCARBAMOL 500 MG PO TABS Oral Take 500 mg by mouth 2 (two) times daily.    [name] PRAVASTATIN SODIUM 20 MG PO TABS Oral Take 1 tablet (20 mg total) by mouth every evening. 30 tablet 3    BP 162/66  Temp 98 F (36.7 C) (Oral)  Resp 18  LMP 09/09/2009  Physical Exam  Nursing note and vitals reviewed. Constitutional: She is oriented to person, place, and time. She appears well-developed and well-nourished.  HENT:  Head: Normocephalic and atraumatic.  Right Ear: External ear normal.  Left Ear: External ear normal.  Nose: Nose normal.  Mouth/Throat: Oropharynx is clear and moist. No oropharyngeal exudate.  Eyes: Conjunctivae are normal. Pupils are equal, round, and reactive to light.  Neck: Normal range  of motion. Neck supple.  Cardiovascular: Normal rate, regular rhythm, normal heart sounds and intact distal pulses.  Exam reveals no gallop and no friction rub.   No murmur heard. Pulmonary/Chest: Effort normal. No respiratory distress. She has wheezes (bilaterally and diffusely). She has no rales. She exhibits no tenderness.  Abdominal: Soft. Bowel sounds are normal. She exhibits no distension and no mass. There is no tenderness. There is no rebound and no guarding.       Obese   Musculoskeletal: Normal range of motion. She exhibits tenderness (mild overlying right lower back (along paraspinal muscles)). She exhibits no edema.  Neurological: She is  alert and oriented to person, place, and time.  Skin: Skin is warm and dry.  Psychiatric: She has a normal mood and affect. Her behavior is normal. Judgment and thought content normal.    ED Course  Procedures (including critical care time)  Labs Reviewed - No data to display Dg Chest 2 View  01/04/2012  *RADIOLOGY REPORT*  Clinical Data: Cough, shortness of breath  CHEST - 2 VIEW  Comparison: 12/10/2010  Findings: Cardiomediastinal silhouette is stable.  No acute infiltrate or pleural effusion.  No pulmonary edema.  Bony thorax is stable.  IMPRESSION: No active disease.  No significant change.  Original Report Authenticated By: Natasha Mead, M.D.     1. Asthma exacerbation   2. Viral upper respiratory tract infection with cough   3. Medication reaction       MDM  56 yo F with history of moderate intermittent asthma presents with wheezing and productive cough of 1 week, worsened today. Patient has had subjective chills, but has been afebrile and remains afebrile here without anti-pyretics. Patient's daughter recently had the same symptoms and was diagnosed with bacterial pneumonia. Patient wheezing diffusely on exam but only mildly dyspneic with oxygen saturation of >95% on room air. Clinical picture, including CXR, not concerning for pneumonia  but suspect asthma exacerbation. Prednisone administered and albuterol/atrovent nebulizations provided.   Patient had an adverse reaction to the Atrovent in which she became more dyspneic, tachycardic, and had wheals. No stridor or worsening of wheezing. IV Benadryl administered (patient had also already received prednisone). Patient provided another round of albuterol with further improvement in symptomatology and resolution of wheezing. Will provide a prescription for a prednisone burst and albuterol treatments. Patient given return precautions, including worsening of signs or symptoms. Patient instructed to follow-up with primary care physician.             Clemetine Marker, MD 01/05/12 [phone]

## 2012-01-04 NOTE — ED Notes (Signed)
Reports having productive cough x 2 weeks with yellow/green sputum production and sore throat. Airway intact at triage.

## 2012-01-04 NOTE — ED Notes (Signed)
Entered pt room to check progress of neb tx. Pt stated neb tx had just finished.  Pt began shaking uncontrollably with HR in 140's. Pt also had rash and hive appear on anterior upper chest near neck bilataterally.  Pt's family stated she had this type of reaction to Atrovent previously. MD notified. Ativan and benadryl prescribed by Preston Fleeting MD. Atrovent added to Pt allergy list.

## 2012-01-04 NOTE — ED Notes (Signed)
RT called for breathing treatment.

## 2012-01-04 NOTE — ED Notes (Signed)
PT resting in stretcher in NAD, respirations even and unlabored.  Pt 95% on 4L O2

## 2012-01-04 NOTE — ED Provider Notes (Signed)
56 year old female with history of asthma and has been having a cough for several weeks and progressive dyspnea. She is not getting relief from albuterol at home. She states she's been running low-grade fevers. On exam, she appears moderately dyspneic. The lungs have scattered wheezes but good airflow. She will be given albuterol nebulizer treatments and steroids and reassessed.  Dione Booze, MD 01/04/12 2220

## 2012-01-05 MED ORDER — HYDROCODONE-ACETAMINOPHEN 5-325 MG PO TABS
1.0000 | ORAL_TABLET | Freq: Once | ORAL | Status: AC
  Administered 2012-01-05: 1 via ORAL
  Filled 2012-01-05: qty 1

## 2012-01-05 MED ORDER — PREDNISONE 50 MG PO TABS: 50.0000 mg | ORAL_TABLET | Freq: Every day | ORAL | Status: DC

## 2012-01-05 MED ORDER — ALBUTEROL SULFATE HFA 108 (90 BASE) MCG/ACT IN AERS: 2.0000 | INHALATION_SPRAY | RESPIRATORY_TRACT | Status: DC | PRN

## 2012-01-05 NOTE — Discharge Instructions (Signed)
Antibiotic Nonuse  Your caregiver felt that the infection or problem was not one that would be helped with an antibiotic. Infections may be caused by viruses or bacteria. Only a caregiver can tell which one of these is the likely cause of an illness. A cold is the most common cause of infection in both adults and children. A cold is a virus. Antibiotic treatment will have no effect on a viral infection. Viruses can lead to many lost days of work caring for sick children and many missed days of school. Children may catch as many as 10 "colds" or "flus" per year during which they can be tearful, cranky, and uncomfortable. The goal of treating a virus is aimed at keeping the ill person comfortable. Antibiotics are medications used to help the body fight bacterial infections. There are relatively few types of bacteria that cause infections but there are hundreds of viruses. While both viruses and bacteria cause infection they are very different types of germs. A viral infection will typically go away by itself within 7 to 10 days. Bacterial infections may spread or get worse without antibiotic treatment. Examples of bacterial infections are:  Sore throats (like strep throat or tonsillitis).   Infection in the lung (pneumonia).   Ear and skin infections.  Examples of viral infections are:  Colds or flus.   Most coughs and bronchitis.   Sore throats not caused by Strep.   Runny noses.  It is often best not to take an antibiotic when a viral infection is the cause of the problem. Antibiotics can kill off the helpful bacteria that we have inside our body and allow harmful bacteria to start growing. Antibiotics can cause side effects such as allergies, nausea, and diarrhea without helping to improve the symptoms of the viral infection. Additionally, repeated uses of antibiotics can cause bacteria inside of our body to become resistant. That resistance can be passed onto harmful bacterial. The next time  you have an infection it may be harder to treat if antibiotics are used when they are not needed. Not treating with antibiotics allows our own immune system to develop and take care of infections more efficiently. Also, antibiotics will work better for [place] when they are prescribed for bacterial infections. Treatments for a child that is ill may include:  Give extra fluids throughout the day to stay hydrated.   Get plenty of rest.   Only give your child over-the-counter or prescription medicines for pain, discomfort, or fever as directed by your caregiver.   The use of a cool mist humidifier may help stuffy noses.   Cold medications if suggested by your caregiver.  Your caregiver may decide to start you on an antibiotic if:  The problem you were seen for today continues for a longer length of time than expected.   You develop a secondary bacterial infection.  SEEK MEDICAL CARE IF:  Fever lasts longer than 5 days.   Symptoms continue to get worse after 5 to 7 days or become severe.   Difficulty in breathing develops.   Signs of dehydration develop (poor drinking, rare urinating, dark colored urine).   Changes in behavior or worsening tiredness (listlessness or lethargy).  Document Released: 09/05/2001 Document Revised: 06/16/2011 Document Reviewed: 03/04/2009 [facility] Patient Information 2012 [place], [place].Asthma Prevention Cigarette smoke, house dust, molds, pollens, animal dander, certain insects, exercise, and even cold air are all triggers that can cause an asthma attack. Often, no specific triggers are identified.  Take the following measures around  your house to reduce attacks:  Avoid cigarette and other smoke. No smoking should be allowed in a home where someone with asthma lives. If smoking is allowed indoors, it should be done in a room with a closed door, and a window should be opened to clear the air. If possible, do not use a wood-burning stove, kerosene heater, or  fireplace. Minimize exposure to all sources of smoke, including incense, candles, fires, and fireworks.   Decrease pollen exposure. Keep your windows shut and use central air during the pollen allergy season. Stay indoors with windows closed from late morning to afternoon, if you can. Avoid mowing the lawn if you have grass pollen allergy. Change your clothes and shower after being outside during this time of year.   Remove molds from bathrooms and wet areas. Do this by cleaning the floors with a fungicide or diluted bleach. Avoid using humidifiers, vaporizers, or swamp coolers. These can spread molds through the air. Fix leaky faucets, pipes, or other sources of water that have mold around them.   Decrease house dust exposure. Do this by using bare floors, vacuuming frequently, and changing furnace and air cooler filters frequently. Avoid using feather, wool, or foam bedding. Use polyester pillows and plastic covers over your mattress. Wash bedding weekly in hot water (hotter than 130 F).   Try to get someone else to vacuum for you once or twice a week, if you can. Stay out of rooms while they are being vacuumed and for a short while afterward. If you vacuum, use a dust mask (from a hardware store), a double-layered or microfilter vacuum cleaner bag, or a vacuum cleaner with a HEPA filter.   Avoid perfumes, talcum powder, hair spray, paints and other strong odors and fumes.   Keep warm-blooded pets (cats, dogs, rodents, birds) outside the home if they are triggers for asthma. If you can't keep the pet outdoors, keep the pet out of your bedroom and other sleeping areas at all times, and keep the door closed. Remove carpets and furniture covered with cloth from your home. If that is not possible, keep the pet away from fabric-covered furniture and carpets.   Eliminate cockroaches. Keep food and garbage in closed containers. Never leave food out. Use poison baits, traps, powders, gels, or paste (for  example, boric acid). If a spray is used to kill cockroaches, stay out of the room until the odor goes away.   Decrease indoor humidity to less than 60%. Use an indoor air cleaning device.   Avoid sulfites in foods and beverages. Do not drink beer or wine or eat dried fruit, processed potatoes, or shrimp if they cause asthma symptoms.   Avoid cold air. Cover your nose and mouth with a scarf on cold or windy days.   Avoid aspirin. This is the most common drug causing serious asthma attacks.   If exercise triggers your asthma, ask your caregiver how you should prepare before exercising. (For example, ask if you could use your inhaler 10 minutes before exercising.)   Avoid close contact with people who have a cold or the flu since your asthma symptoms may get worse if you catch the infection from them. Wash your hands thoroughly after touching items that may have been handled by others with a respiratory infection.   Get a flu shot every year to protect against the flu virus, which often makes asthma worse for days to weeks. Also get a pneumonia shot once every five to 10 years.  Call your caregiver if you want further information about measures you can take to help prevent asthma attacks. Document Released: 06/27/2005 Document Revised: 06/16/2011 Document Reviewed: 05/05/2009 [facility] Patient Information 2012 [place], [place].Asthma Attack Prevention HOW CAN ASTHMA BE PREVENTED? Currently, there is no way to prevent asthma from starting. However, you can take steps to control the disease and prevent its symptoms after you have been diagnosed. Learn about your asthma and how to control it. Take an active role to control your asthma by working with your caregiver to create and follow an asthma action plan. An asthma action plan guides you in taking your medicines properly, avoiding factors that make your asthma worse, tracking your level of asthma control, responding to worsening asthma, and seeking  emergency care when needed. To track your asthma, keep records of your symptoms, check your peak flow number using a peak flow meter (handheld device that shows how well air moves out of your lungs), and get regular asthma checkups.  Other ways to prevent asthma attacks include:  Use medicines as your caregiver directs.   Identify and avoid things that make your asthma worse (as much as you can).   Keep track of your asthma symptoms and level of control.   Get regular checkups for your asthma.   With your caregiver, write a detailed plan for taking medicines and managing an asthma attack. Then be sure to follow your action plan. Asthma is an ongoing condition that needs regular monitoring and treatment.   Identify and avoid asthma triggers. A number of outdoor allergens and irritants (pollen, mold, cold air, air pollution) can trigger asthma attacks. Find out what causes or makes your asthma worse, and take steps to avoid those triggers (see below).   Monitor your breathing. Learn to recognize warning signs of an attack, such as slight coughing, wheezing or shortness of breath. However, your lung function may already decrease before you notice any signs or symptoms, so regularly measure and record your peak airflow with a home peak flow meter.   Identify and treat attacks early. If you act quickly, you're less likely to have a severe attack. You will also need less medicine to control your symptoms. When your peak flow measurements decrease and alert you to an upcoming attack, take your medicine as instructed, and immediately stop any activity that may have triggered the attack. If your symptoms do not improve, get medical help.   Pay attention to increasing quick-relief inhaler use. If you find yourself relying on your quick-relief inhaler (such as albuterol), your asthma is not under control. See your caregiver about adjusting your treatment.  IDENTIFY AND CONTROL FACTORS THAT MAKE YOUR ASTHMA  WORSE A number of common things can set off or make your asthma symptoms worse (asthma triggers). Keep track of your asthma symptoms for several weeks, detailing all the environmental and emotional factors that are linked with your asthma. When you have an asthma attack, go back to your asthma diary to see which factor, or combination of factors, might have contributed to it. Once you know what these factors are, you can take steps to control many of them.  Allergies: If you have allergies and asthma, it is important to take asthma prevention steps at home. Asthma attacks (worsening of asthma symptoms) can be triggered by allergies, which can cause temporary increased inflammation of your airways. Minimizing contact with the substance to which you are allergic will help prevent an asthma attack. Animal Dander:   Some people are  allergic to the flakes of skin or dried saliva from animals with fur or feathers. Keep these pets out of your home.   If you can't keep a pet outdoors, keep the pet out of your bedroom and other sleeping areas at all times, and keep the door closed.   Remove carpets and furniture covered with cloth from your home. If that is not possible, keep the pet away from fabric-covered furniture and carpets.  Dust Mites:  Many people with asthma are allergic to dust mites. Dust mites are tiny bugs that are found in every home, in mattresses, pillows, carpets, fabric-covered furniture, bedcovers, clothes, stuffed toys, fabric, and other fabric-covered items.   Cover your mattress in a special dust-proof cover.   Cover your pillow in a special dust-proof cover, or wash the pillow each week in hot water. Water must be hotter than 130 F to kill dust mites. Cold or warm water used with detergent and bleach can also be effective.   Wash the sheets and blankets on your bed each week in hot water.   Try not to sleep or lie on cloth-covered cushions.   Call ahead when traveling and ask  for a smoke-free hotel room. Bring your own bedding and pillows, in case the hotel only supplies feather pillows and down comforters, which may contain dust mites and cause asthma symptoms.   Remove carpets from your bedroom and those laid on concrete, if you can.   Keep stuffed toys out of the bed, or wash the toys weekly in hot water or cooler water with detergent and bleach.  Cockroaches:  Many people with asthma are allergic to the droppings and remains of cockroaches.   Keep food and garbage in closed containers. Never leave food out.   Use poison baits, traps, powders, gels, or paste (for example, boric acid).   If a spray is used to kill cockroaches, stay out of the room until the odor goes away.  Indoor Mold:  Fix leaky faucets, pipes, or other sources of water that have mold around them.   Clean moldy surfaces with a cleaner that has bleach in it.  Pollen and Outdoor Mold:  When pollen or mold spore counts are high, try to keep your windows closed.   Stay indoors with windows closed from late morning to afternoon, if you can. Pollen and some mold spore counts are highest at that time.   Ask your caregiver whether you need to take or increase anti-inflammatory medicine before your allergy season starts.  Irritants:   Tobacco smoke is an irritant. If you smoke, ask your caregiver how you can quit. Ask family members to quit smoking, too. Do not allow smoking in your home or car.   If possible, do not use a wood-burning stove, kerosene heater, or fireplace. Minimize exposure to all sources of smoke, including incense, candles, fires, and fireworks.   Try to stay away from strong odors and sprays, such as perfume, talcum powder, hair spray, and paints.   Decrease humidity in your home and use an indoor air cleaning device. Reduce indoor humidity to below 60 percent. Dehumidifiers or central air conditioners can do this.   Try to have someone else vacuum for you once or twice  a week, if you can. Stay out of rooms while they are being vacuumed and for a short while afterward.   If you vacuum, use a dust mask from a hardware store, a double-layered or microfilter vacuum cleaner bag, or a vacuum  cleaner with a HEPA filter.   Sulfites in foods and beverages can be irritants. Do not drink beer or wine, or eat dried fruit, processed potatoes, or shrimp if they cause asthma symptoms.   Cold air can trigger an asthma attack. Cover your nose and mouth with a scarf on cold or windy days.   Several health conditions can make asthma more difficult to manage, including runny nose, sinus infections, reflux disease, psychological stress, and sleep apnea. Your caregiver will treat these conditions, as well.   Avoid close contact with people who have a cold or the flu, since your asthma symptoms may get worse if you catch the infection from them. Wash your hands thoroughly after touching items that may have been handled by people with a respiratory infection.   Get a flu shot every year to protect against the flu virus, which often makes asthma worse for days or weeks. Also get a pneumonia shot once every five to 10 years.  Drugs:  Aspirin and other painkillers can cause asthma attacks. 10% to 20% of people with asthma have sensitivity to aspirin or a group of painkillers called non-steroidal anti-inflammatory drugs (NSAIDS), such as ibuprofen and naproxen. These drugs are used to treat pain and reduce fevers. Asthma attacks caused by any of these medicines can be severe and even fatal. These drugs must be avoided in people who have known aspirin sensitive asthma. Products with acetaminophen are considered safe for people who have asthma. It is important that people with aspirin sensitivity read labels of all over-the-counter drugs used to treat pain, colds, coughs, and fever.   Beta blockers and ACE inhibitors are other drugs which you should discuss with your caregiver, in relation to  your asthma.  ALLERGY SKIN TESTING  Ask your asthma caregiver about allergy skin testing or blood testing (RAST test) to identify the allergens to which you are sensitive. If you are found to have allergies, allergy shots (immunotherapy) for asthma may help prevent future allergies and asthma. With allergy shots, small doses of allergens (substances to which you are allergic) are injected under your skin on a regular schedule. Over a period of time, your body may become used to the allergen and less responsive with asthma symptoms. You can also take measures to minimize your exposure to those allergens. EXERCISE  If you have exercise-induced asthma, or are planning vigorous exercise, or exercise in cold, humid, or dry environments, prevent exercise-induced asthma by following your caregiver's advice regarding asthma treatment before exercising. Document Released: 06/15/2009 Document Revised: 06/16/2011 Document Reviewed: 06/15/2009 [facility] Patient Information 2012 [place], [place].Asthma, Acute Bronchospasm Your exam shows you have asthma, or acute bronchospasm that acts like asthma. Bronchospasm means your air passages become narrowed. These conditions are due to inflammation and airway spasm that cause narrowing of the bronchial tubes in the lungs. This causes you to have wheezing and shortness of breath. CAUSES  Respiratory infections and allergies most often bring on these attacks. Smoking, air pollution, cold air, emotional upsets, and vigorous exercise can also bring them on.  TREATMENT   Treatment is aimed at making the narrowed airways larger. Mild asthma/bronchospasm is usually controlled with inhaled medicines. Albuterol is a common medicine that you breathe in to open spastic or narrowed airways. Some trade names for albuterol are Ventolin or Proventil. Steroid medicine is also used to reduce the inflammation when an attack is moderate or severe. Antibiotics (medications used to kill germs)  are only used if a bacterial infection is present.  If you are pregnant and need to use Albuterol (Ventolin or Proventil), you can expect the baby to move more than usual shortly after the medicine is used.  HOME CARE INSTRUCTIONS   Rest.   Drink plenty of liquids. This helps the mucus to remain thin and easily coughed up. Do not use caffeine or alcohol.   Do not smoke. Avoid being exposed to second-hand smoke.   You play a critical role in keeping yourself in good health. Avoid exposure to things that cause you to wheeze. Avoid exposure to things that cause you to have breathing problems. Keep your medications up-to-date and available. Carefully follow your doctor's treatment plan.   When pollen or pollution is bad, keep windows closed and use an air conditioner go to places with air conditioning. If you are allergic to furry pets or birds, find new homes for them or keep them outside.   Take your medicine exactly as prescribed.   Asthma requires careful medical attention. See your caregiver for follow-up as advised. If you are more than [redacted] weeks pregnant and you were prescribed any new medications, let your Obstetrician know about the visit and how you are doing. Arrange a recheck.  SEEK IMMEDIATE MEDICAL CARE IF:   You are getting worse.   You have trouble breathing. If severe, call 911.   You develop chest pain or discomfort.   You are throwing up or not drinking fluids.   You are not getting better within 24 hours.   You are coughing up yellow, green, brown, or bloody sputum.   You develop a fever over 102 F (38.9 C).   You have trouble swallowing.  MAKE SURE YOU:   Understand these instructions.   Will watch your condition.   Will get help right away if you are not doing well or get worse.  Document Released: 10/12/2006 Document Revised: 06/16/2011 Document Reviewed: 06/11/2007 [facility] Patient Information 2012 [place], [place].Drug Allergy A drug allergy means you  have a strange reaction to a medicine. You may have puffiness (swelling), itching, red rashes, and hives. Some allergic reactions can be life-threatening. HOME CARE  If you do not know what caused your reaction:  Write down medicines you use.   Write down any problems you have after using medicine.   Avoid things that cause a reaction.   You can see an allergy doctor to be tested for allergies.  If you have hives or a rash:  Take medicine as told by your doctor.   Place cold cloths on your skin.   Do not take hot baths or hot showers. Take baths in cool water.  If you are severely allergic:  Wear a medical bracelet or necklace that lists your allergy.   Carry your allergy kit or medicine shot to treat severe allergic reactions with you. These can save your life.   Do not drive until medicine from your shot has worn off, unless your doctor says it is okay.  GET HELP RIGHT AWAY IF:   Your mouth is puffy, or you have trouble breathing.   You have a tight feeling in your chest or throat.   You have hives, puffiness, or itching all over your body.   You throw up (vomit) or have watery poop (diarrhea).   You feel dizzy or pass out (faint).   You think you are having a reaction. Problems often start within 30 minutes after taking a medicine.   You are getting worse, not better.   You  have new problems.   Your problems go away and then come back.  This is an emergency. Use your medicine shot or allergy kit as told. Call yourlocal emergency services (911 in [place] after the shot. Even if you feel better after the shot, you need to go to the hospital. You may need more medicine to control a severe reaction. MAKE SURE YOU:  Understand these instructions.   Will watch your condition.   Will get help right away if you are not doing well or get worse.  Document Released: 08/04/2004 Document Revised: 06/16/2011 Document Reviewed: 12/23/2010 [facility] Patient Information 2012  [place], [place].Pulmonary Function Tests A pulmonary function test measures how well you move air in and out of your lungs. There are a number of tests that can be done. The most often the measurement used is the peak flow test. This test can also be used at home. Examples of these include the Tru Zone, Astech, [place], and Spir-O-Flow meters.  Measuring how well air moves out of your lung can be used as a reliable guide to treatment, even if you have no symptoms. Normal values of peak flow rates depend on your age, height, and sex. If your peak flow measurement is lower than normal, treatment with inhaled medicines can be started to prevent a more serious episode of asthma or emphysema. You should measure your peak flows daily in the morning, and more often throughout the day if you have any symptoms such as shortness of breath or cough. Keep a record of your peak flow results to review with your caregiver. The mouth piece of your peak flow meter should be washed with soap and water once a week. Otherwise it does not need special care. To measure your peak flow, proper technique is very important. Follow the instructions that accompany your meter. Usually you must set the indicator to 0, take a deep breath, and blow as quickly and forcefully as possible into the meter. Reset the meter and repeat the measurement twice; record your best result. If your test results put you in the "green" zone, this means your ability to move air is 80-100% of normal. No special treatment may be needed. If your test puts you in the "yellow" zone, you are between 50 and 80% of normal. Treatment should be started. Results in the "red" zone mean you are below 50% of normal and you should initiate treatment and call your caregiver right away. Document Released: 08/04/2004 Document Revised: 06/16/2011 Document Reviewed: 06/27/2005 [facility] Patient Information 2012 [place], [place].Upper Respiratory Infection, Adult An upper  respiratory infection (URI) is also sometimes known as the common cold. The upper respiratory tract includes the nose, sinuses, throat, trachea, and bronchi. Bronchi are the airways leading to the lungs. Most people improve within 1 week, but symptoms can last up to 2 weeks. A residual cough may last even longer.  CAUSES Many different viruses can infect the tissues lining the upper respiratory tract. The tissues become irritated and inflamed and often become very moist. Mucus production is also common. A cold is contagious. You can easily spread the virus to others by oral contact. This includes kissing, sharing a glass, coughing, or sneezing. Touching your mouth or nose and then touching a surface, which is then touched by another person, can also spread the virus. SYMPTOMS  Symptoms typically develop 1 to 3 days after you come in contact with a cold virus. Symptoms vary from person to person. They may include:  Runny nose.  Sneezing.   Nasal congestion.   Sinus irritation.   Sore throat.   Loss of voice (laryngitis).   Cough.   Fatigue.   Muscle aches.   Loss of appetite.   Headache.   Low-grade fever.  DIAGNOSIS  You might diagnose your own cold based on familiar symptoms, since most people get a cold 2 to 3 times a year. Your caregiver can confirm this based on your exam. Most importantly, your caregiver can check that your symptoms are not due to another disease such as strep throat, sinusitis, pneumonia, asthma, or epiglottitis. Blood tests, throat tests, and X-rays are not necessary to diagnose a common cold, but they may sometimes be helpful in excluding other more serious diseases. Your caregiver will decide if any further tests are required. RISKS AND COMPLICATIONS  You may be at risk for a more severe case of the common cold if you smoke cigarettes, have chronic heart disease (such as heart failure) or lung disease (such as asthma), or if you have a weakened immune  system. The very young and very old are also at risk for more serious infections. Bacterial sinusitis, middle ear infections, and bacterial pneumonia can complicate the common cold. The common cold can worsen asthma and chronic obstructive pulmonary disease (COPD). Sometimes, these complications can require emergency medical care and may be life-threatening. PREVENTION  The best way to protect against getting a cold is to practice good hygiene. Avoid oral or hand contact with people with cold symptoms. Wash your hands often if contact occurs. There is no clear evidence that vitamin C, vitamin E, echinacea, or exercise reduces the chance of developing a cold. However, it is always recommended to get plenty of rest and practice good nutrition. TREATMENT  Treatment is directed at relieving symptoms. There is no cure. Antibiotics are not effective, because the infection is caused by a virus, not by bacteria. Treatment may include:  Increased fluid intake. Sports drinks offer valuable electrolytes, sugars, and fluids.   Breathing heated mist or steam (vaporizer or shower).   Eating chicken soup or other clear broths, and maintaining good nutrition.   Getting plenty of rest.   Using gargles or lozenges for comfort.   Controlling fevers with ibuprofen or acetaminophen as directed by your caregiver.   Increasing usage of your inhaler if you have asthma.  Zinc gel and zinc lozenges, taken in the first 24 hours of the common cold, can shorten the duration and lessen the severity of symptoms. Pain medicines may help with fever, muscle aches, and throat pain. A variety of non-prescription medicines are available to treat congestion and runny nose. Your caregiver can make recommendations and may suggest nasal or lung inhalers for other symptoms.  HOME CARE INSTRUCTIONS   Only take over-the-counter or prescription medicines for pain, discomfort, or fever as directed by your caregiver.   Use a warm mist  humidifier or inhale steam from a shower to increase air moisture. This may keep secretions moist and make it easier to breathe.   Drink enough water and fluids to keep your urine clear or pale yellow.   Rest as needed.   Return to work when your temperature has returned to normal or as your caregiver advises. You may need to stay home longer to avoid infecting others. You can also use a face mask and careful hand washing to prevent spread of the virus.  SEEK MEDICAL CARE IF:   After the first few days, you feel you are getting worse  rather than better.   You need your caregiver's advice about medicines to control symptoms.   You develop chills, worsening shortness of breath, or brown or red sputum. These may be signs of pneumonia.   You develop yellow or brown nasal discharge or pain in the face, especially when you bend forward. These may be signs of sinusitis.   You develop a fever, swollen neck glands, pain with swallowing, or white areas in the back of your throat. These may be signs of strep throat.  SEEK IMMEDIATE MEDICAL CARE IF:   You have a fever.   You develop severe or persistent headache, ear pain, sinus pain, or chest pain.   You develop wheezing, a prolonged cough, cough up blood, or have a change in your usual mucus (if you have chronic lung disease).   You develop sore muscles or a stiff neck.  Document Released: 12/21/2000 Document Revised: 06/16/2011 Document Reviewed: 10/29/2010 [facility] Patient Information 2012 [place], [place].

## 2012-01-06 NOTE — ED Provider Notes (Signed)
I saw and evaluated the patient, reviewed the resident's note and I agree with the findings and plan.   Ival Basquez, MD 01/06/12 0010 

## 2012-01-10 ENCOUNTER — Ambulatory Visit (INDEPENDENT_AMBULATORY_CARE_PROVIDER_SITE_OTHER): Payer: Self-pay | Admitting: Internal Medicine

## 2012-01-10 ENCOUNTER — Encounter: Payer: Self-pay | Admitting: Internal Medicine

## 2012-01-10 VITALS — BP 150/79 | HR 77 | Temp 98.3°F | Ht 64.0 in | Wt 332.7 lb

## 2012-01-10 DIAGNOSIS — M549 Dorsalgia, unspecified: Secondary | ICD-10-CM

## 2012-01-10 DIAGNOSIS — I10 Essential (primary) hypertension: Secondary | ICD-10-CM

## 2012-01-10 DIAGNOSIS — R911 Solitary pulmonary nodule: Secondary | ICD-10-CM

## 2012-01-10 DIAGNOSIS — Z6841 Body Mass Index (BMI) 40.0 and over, adult: Secondary | ICD-10-CM

## 2012-01-10 DIAGNOSIS — J45909 Unspecified asthma, uncomplicated: Secondary | ICD-10-CM

## 2012-01-10 DIAGNOSIS — D509 Iron deficiency anemia, unspecified: Secondary | ICD-10-CM

## 2012-01-10 DIAGNOSIS — M1990 Unspecified osteoarthritis, unspecified site: Secondary | ICD-10-CM

## 2012-01-10 DIAGNOSIS — E785 Hyperlipidemia, unspecified: Secondary | ICD-10-CM

## 2012-01-10 DIAGNOSIS — Z0000 Encounter for general adult medical examination without abnormal findings: Secondary | ICD-10-CM

## 2012-01-10 DIAGNOSIS — J984 Other disorders of lung: Secondary | ICD-10-CM

## 2012-01-10 DIAGNOSIS — D649 Anemia, unspecified: Secondary | ICD-10-CM

## 2012-01-10 NOTE — Assessment & Plan Note (Signed)
Recently came to ED for an episode of asthma exacerbation Continue with albuterol If condition worsens, return to clinic for re-evaluation Avoid exacerbating factors

## 2012-01-10 NOTE — Assessment & Plan Note (Signed)
CT chest June 2013: *RADIOLOGY REPORT*  Clinical Data: Pulmonary nodules on 10/11. No chest complaints.  Hypertension. Hyper lipidemia. Night sweats. Dizziness.  Fatigue. Ex-smoker. 10 pack year history.  CT CHEST WITH CONTRAST  Technique: Multidetector CT imaging of the chest was performed  following the standard protocol during bolus administration of  intravenous contrast.  Contrast: 80mL OMNIPAQUE IOHEXOL 300 MG/ML SOLN  Comparison: Plain film of 12/10/2010. Abdominal pelvic CT of  12/30/2010. No prior chest CT.  Findings: Lung windows demonstrate minimal motion degradation.  3 mm right lower lobe nodule laterally on image 38, unchanged.  More medial 3 mm right lower lobe nodule, also on image 38. Likely  similar back on 12/30/2010. Better visualized today due to  differences in slice selection.  Vaguely present on 05/03/2010 as well.  3 mm right lower lobe nodule on image 29 is similar to on image 5  of the 05/03/2010 exam.  8 mm left lower lobe nodule on image 32 is similar in size to the  05/03/2010 exam. This is flat in morphology on sagittal image 80,  suggesting a subpleural lymph node etiology.  No enlarging nodules.  Soft tissue windows demonstrate small bilateral axillary nodes,  without adenopathy. Normal heart size without pericardial or  pleural effusion. No mediastinal or hilar adenopathy.  Limited abdominal imaging demonstrates no significant findings. No  acute osseous abnormality.  IMPRESSION:  Similar bilateral pulmonary nodules. Favored to represent  subpleural lymph nodes. The largest nodule, within the left lower  lobe, is most consistent with a subpleural lymph node giving its  flat morphology.

## 2012-01-10 NOTE — Progress Notes (Signed)
  Subjective:    Patient ID: [name], female    DOB: [birth year], 56 y.o.   MRN: [id number]  HPI Patient is a 56 year old female with PMH of HTN, HL, Asthma, morbid obesity, iron deficiency anemia, s/p left knee replacement presenting to clinic for routine follow up visit. [name] blood pressure medication was modified during her last visit in May 2013. Today, her BP has improved to 150/79 but she does report non-compliance with her medications. She does not have any other complaints or concerns at this time. She denies chest pain, abdominal pain, N/V/D, fever or chills at this time. She was recently seen in the ED for an episode of asthma exacerbation but was not admitted.    Review of Systems  Eyes: Positive for visual disturbance.  Musculoskeletal: Positive for back pain.  Neurological: Positive for headaches.   + headaches when BP uncontrolled Occasional blurry vision Slowly improving chronic back and shoulder pain. +hotflashes    Objective:   Physical Exam  Constitutional: She is oriented to person, place, and time. She appears well-developed.  HENT:  Head: Normocephalic and atraumatic.  Eyes: EOM are normal. Pupils are equal, round, and reactive to light.  Neck: Normal range of motion. Neck supple.  Cardiovascular: Normal rate, regular rhythm and normal heart sounds.   Pulmonary/Chest: Breath sounds normal.  Abdominal: Soft. Bowel sounds are normal. She exhibits no distension. There is no tenderness.  Musculoskeletal: Normal range of motion.  Neurological: She is alert and oriented to person, place, and time.  Skin: Skin is warm.      Assessment & Plan:  Patient is a 56 year old female with PMH of HTN, Asthma, HL, Morbid obesity, and Iron deficiency anemia presenting to the clinic for a routine follow up visit. Patient's BP is improving and medication was recently adjusted on last office visit.   1) HTN: Today: 15079  -D/C Lasix, if BP still not adequately  controlled to goal, consider adding CCB as previously discussed  -Continue Lisinopril/HCTZ 20-12.5mg  as prescribed 2) Asthma:   -Recently seen in the ED for one episode of asthma exacerbation  -Given prednisone at that time, not admitted  -Continue Albuterol treatment, if attacks continue or condition worsens please see again for medication adjustment.   -Continue to avoid exacerbating factors (Cig smoke) 3) Iron deficiency anemia  -Continue Ferrous Sulfate 325mg  4) Morbid Obesity  -Weight loss and diet control counseling given again  -Continue exercising  -Patient has lost 4 pounds since last visit. Today's weight: 332lb down from 336lb 5) Hyperlipidemia  -Diet control  -Exercise  -Medication list includes Statin, not sure if patient is fully compliant 6) Health management:  -Return to clinic 01/26/12 for Pap Smear and follow up visit

## 2012-01-10 NOTE — Assessment & Plan Note (Signed)
Pap smear July 18th, 2013 in clinic

## 2012-01-10 NOTE — Assessment & Plan Note (Signed)
S/p left knee replacement December 2012 No major complaints at this time Minor left leg swelling

## 2012-01-10 NOTE — Assessment & Plan Note (Signed)
BP improved to 15079 this visit Recently Started on Lisinopril/HCTZ 20-12.5MG  Patient claims to miss occasional doses Continue Lisinopril/HCTZ Return for follow up appointment with Dr. Saralyn Pilar Lasix D/C'd this visit, consider adding CCB on follow up as discussed on previous visit

## 2012-01-10 NOTE — Patient Instructions (Addendum)
Please return for follow up on previously scheduled appointment on the 18th of July with Dr. Saralyn Pilar.  Lasix was discontinued at this visit, but BP medication will need to be re-evaluated by Dr. Saralyn Pilar on next visit. Check BP again next visit. Please continue to take daily medications regularly and monitor BP.

## 2012-01-10 NOTE — Assessment & Plan Note (Signed)
No major complaints at this time

## 2012-01-10 NOTE — Assessment & Plan Note (Signed)
Most recent Hb: 11.7 Continue Ferrous Sulfate

## 2012-01-10 NOTE — Assessment & Plan Note (Signed)
Advised to remain compliant with medications (Statin) along with diet control and exercise.

## 2012-01-10 NOTE — Assessment & Plan Note (Signed)
Continue weight loss and exercise Has lost 4 pounds since last visit

## 2012-01-17 NOTE — Progress Notes (Signed)
agree

## 2012-01-26 ENCOUNTER — Encounter: Payer: Self-pay | Admitting: Internal Medicine

## 2012-03-29 ENCOUNTER — Ambulatory Visit (INDEPENDENT_AMBULATORY_CARE_PROVIDER_SITE_OTHER): Payer: Self-pay | Admitting: Internal Medicine

## 2012-03-29 ENCOUNTER — Encounter: Payer: Self-pay | Admitting: Internal Medicine

## 2012-03-29 VITALS — BP 152/82 | HR 87 | Temp 98.5°F | Ht 65.0 in | Wt 334.6 lb

## 2012-03-29 DIAGNOSIS — N898 Other specified noninflammatory disorders of vagina: Secondary | ICD-10-CM

## 2012-03-29 DIAGNOSIS — M79604 Pain in right leg: Secondary | ICD-10-CM

## 2012-03-29 DIAGNOSIS — E785 Hyperlipidemia, unspecified: Secondary | ICD-10-CM

## 2012-03-29 DIAGNOSIS — M791 Myalgia, unspecified site: Secondary | ICD-10-CM

## 2012-03-29 DIAGNOSIS — Z0000 Encounter for general adult medical examination without abnormal findings: Secondary | ICD-10-CM

## 2012-03-29 DIAGNOSIS — J45909 Unspecified asthma, uncomplicated: Secondary | ICD-10-CM

## 2012-03-29 DIAGNOSIS — B372 Candidiasis of skin and nail: Secondary | ICD-10-CM | POA: Insufficient documentation

## 2012-03-29 DIAGNOSIS — M79609 Pain in unspecified limb: Secondary | ICD-10-CM

## 2012-03-29 DIAGNOSIS — D649 Anemia, unspecified: Secondary | ICD-10-CM

## 2012-03-29 DIAGNOSIS — I10 Essential (primary) hypertension: Secondary | ICD-10-CM

## 2012-03-29 DIAGNOSIS — Z23 Encounter for immunization: Secondary | ICD-10-CM

## 2012-03-29 DIAGNOSIS — Z124 Encounter for screening for malignant neoplasm of cervix: Secondary | ICD-10-CM

## 2012-03-29 LAB — CBC
HCT: 36.9 % (ref 36.0–46.0)
Hemoglobin: 12.1 g/dL (ref 12.0–15.0)
MCH: 28.5 pg (ref 26.0–34.0)
MCHC: 32.8 g/dL (ref 30.0–36.0)
MCV: 86.8 fL (ref 78.0–100.0)
Platelets: 334 10*3/uL (ref 150–400)
RBC: 4.25 MIL/uL (ref 3.87–5.11)
RDW: 14.5 % (ref 11.5–15.5)
WBC: 4.5 10*3/uL (ref 4.0–10.5)

## 2012-03-29 MED ORDER — NYSTATIN 100000 UNIT/GM EX POWD: Freq: Four times a day (QID) | CUTANEOUS | Status: DC

## 2012-03-29 MED ORDER — AMLODIPINE BESYLATE 5 MG PO TABS: 5.0000 mg | ORAL_TABLET | Freq: Every day | ORAL | Status: DC

## 2012-03-29 NOTE — Progress Notes (Signed)
Subjective:    Patient: [name]   Age: 56 y.o.   Gender: female   MRN: [id number]  DOB: 1955-08-07     HPI: [name] is a 56 y.o. with a PMHx of HLD, iron-deficiency anemia, DJD, morbid obesity (Body mass index is 55.68 kg/(m^2).) who presents to clinic today for the following:    1) HTN - Patient does check blood pressure regularly at home - usually gets 148/70s  Currently taking lisinopril-HCTZ x 2 pills daily. Patient misses doses 1 x per week on average. denies headaches, chest pain, shortness of breath. does not request refills today.   2) HLD - currently taking Pravastatin 20 mg daily - which was started in 11/2011 in response to LDL of 145 mg/dL with goal of < 119 mg/dL. She never started the medication because she was nervous about the side effects. denies chest pain, difficulty breathing, palpitations, tachycardia.  She does confirms muscle aches and pain in thighs. does not request refills today.    3) Preventative care - patient is due for flu shot, PCV,  and Pap smear. She confirms external vaginal itching. Denies vaginal discharge, external lesions, dysuria, hematuria, malodorous urine. She is currently sexually active, and does not use any form of protection.   4) Anemia, normocytic - patient has a history of normocytic anemia - which has been of unclear cause. Denies recent blood in stools, blood in urine,lightheadedness, dizziness, fatigue.   5) Skin irritation - patient notes that in her intertriginous areas and between thigh, she is having significant itching without rash, has also noted darkening of skin. No fevers, chills, skin lesions noted.   6) Asthma - Patient notes that currently her asthma is well controlled, however, she has had an asthma exacerbation as recently as 12/2011 and continues to use her albuterol three times daily for wheezing (is not currently on maintenance therapy because cannot afford inhaled steroids). Denies cough, fevers, chills,  shortness of breath.     Review of Systems: Per HPI.    Current Outpatient Medications: Medication Sig  . albuterol (PROVENTIL HFA;VENTOLIN HFA) 108 (90 BASE) MCG/ACT inhaler Inhale 2 puffs into the lungs every 4 (four) hours as needed for wheezing or shortness of breath.  . cyclobenzaprine (FLEXERIL) 10 MG tablet Take 10 mg by mouth 2 (two) times daily.  . ferrous sulfate 325 (65 FE) MG tablet Take 1 tablet (325 mg total) by mouth 2 (two) times daily with a meal.  . ibuprofen (ADVIL,MOTRIN) 800 MG tablet Take 800 mg by mouth at bedtime as needed. For pain  . lisinopril-hydrochlorothiazide (ZESTORETIC) 20-12.5 MG per tablet Take 2 tablets by mouth daily.  . pravastatin (PRAVACHOL) 20 MG tablet Take 1 tablet (20 mg total) by mouth every evening.     Allergies  Allergen Reactions  . Ketorolac Tromethamine Shortness Of Breath and Palpitations  . Atrovent (Ipratropium) Hives and Rash  . Latex Other (See Comments)    Powdered. Confirm type of reaction with patient.    Past Medical History  Diagnosis Date  . Hypertension   . Hyperlipidemia   . Asthma   . Obesity   . Lower extremity edema     Chronic. 2D echo (2005) - EF 65%.  . Seasonal allergies   . Abnormal EKG 06/2003    History of inverted T waves V1-V3. Normal 2D echo (07/23/2003): LVEF 65%.  . History of multiple pulmonary nodules     Incidental finding: CT Abd/ Pelvis (04/2010) - Several small lower lobe  lung nodules, including one pure ground-glass pulmonary nodule measuring 8 mm in the left lower lobe.  Recommend follow-up chest CT (IV contrast preferred) in 6 months to document stability. //  CT Abd/ Pelvis (07/2010) -  3 mm RLL and  8 mm LLL nodule stable.  Other nodules unchanged, likely benign.  . 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 Followed by Sports Medicine, Dr. Jennette Kettle.  . Incarcerated ventral hernia 04/2010    Noted on CT Abd/ pelvis (04/2010). Patient now s/p ventral hernia repair by Dr.  Gerrit Friends (12/2010)  . Anemia     BL Hgb 11-12. Ferritin 14 - low normal (08/2007). Colonoscopy 2009 - external hemorrhoids (excellent prep). Last anemia panel (12/2010) - Iron  24, TIBC 269,  B12  316, Folate 11.9, Ferritin 73.  . Night sweats   . Dizziness   . Fatigue     loss of sleep  . Wears dentures   . Wears glasses   . 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 Incision and drainage abscess anal 07/2008    I&D and debridgement of anorectal abscess  . Inguinal hernia repair   . Tubal ligation   . Incisional hernia repair 12/2010    Repair of incarcerated ventral incisional hernia with Ethicon mesh patch - performed by Dr. Gerrit Friends.   . Foot surgery 2004     left  . Total knee arthroplasty 06/16/2011    Left TOTAL KNEE ARTHROPLASTY;  Surgeon: Kennieth Rad;  Location: [facility] OR;  Service: Orthopedics;  Laterality: Left;  left total knee arthroplasty     Objective:    Physical Exam: Filed Vitals:   03/29/12 1338  BP: 149/82  Pulse: 83  Temp: 98.5 F (36.9 C)      General: Vital signs reviewed and noted. Well-developed, well-nourished, in no acute distress; alert, appropriate and cooperative throughout examination.  Head: Normocephalic, atraumatic.  Lungs:  Normal respiratory effort. Clear to auscultation BL without crackles or wheezes.  Heart: RRR. S1 and S2 normal without gallop, rubs. (+) murmur.  Abdomen:  BS normoactive. Soft, Nondistended, non-tender.  No masses or organomegaly.  Extremities: No pretibial edema. Hyperpigmentation of skin between thighs, faint velvety rash, most consistent with cutaneous candidal infection.  Pelvic: External genitalia: No external lesions appreciated. Vagina: normal appearing vagina with normal color and discharge, no lesions Cervix: normal appearing cervix without discharge or lesions Adnexa: normal adnexa in size, nontender and no masses PAP: Pap smear done  today Exam was chaperoned by: Theotis Barrio    Assessment/ Plan:   Case and plan of care discussed with Dr. Blanch Media.

## 2012-03-29 NOTE — Assessment & Plan Note (Signed)
Pertinent Labs: CBC:    Component Value Date/Time   WBC 4.7 11/17/2011 1656   HGB 11.7* 11/17/2011 1656   HCT 37.7 11/17/2011 1656   PLT 318 11/17/2011 1656   MCV 88.5 11/17/2011 1656   NEUTROABS 1.8 11/17/2011 1656   LYMPHSABS 2.3 11/17/2011 1656   MONOABS 0.3 11/17/2011 1656   EOSABS 0.2 11/17/2011 1656   BASOSABS 0.0 11/17/2011 1656   Lab Results  Component Value Date   IRON 61 11/17/2011   TIBC 351 11/17/2011   FERRITIN 27 11/17/2011  Transferrin sat 17%.  Assessment: Thought to be a mixed anemia contributed by anemia of chronic disease in addition to iron-deficiency anemia. The cause of these entities is not clear. She is postmenopausal, does have known external hemorrhoids that are not bleeding grossly at this point, and is up to date on her mammogram screenings. Last TSH in 2012 was within normal limits.   She was started on iron supplementation during our last visit together in 11/2011.  Plan:      Recheck CBC today to assess response to iron supplementation.  Check TSH.

## 2012-03-29 NOTE — Patient Instructions (Addendum)
   Please follow-up at the clinic in 2 months, at which time we will reevaluate your blood pressure, weight, leg itching - OR, please follow-up in the clinic sooner if needed.  There have been changes in your medications:  START taking your cholesterol medication daily (pravastatin).  START nystatin powder 4 times daily to affected areas (between thighs). For fungal infection.   See [facility] ASAP  Get your pulmonary function tests done after you get reapproved for orange card.  If you have been started on new medication(s), and you develop symptoms concerning for allergic reaction, including, but not limited to, throat closing, tongue swelling, rash, please stop the medication immediately and call the clinic at [phone], and go to the ER. You are getting labs today, if they are abnormal I will give you a call.   If symptoms worsen, or new symptoms arise, please call the clinic or go to the ER.  Please bring all of your medications in a bag to your next visit.

## 2012-03-29 NOTE — Assessment & Plan Note (Signed)
Health Maintenance  Topic Date Due  . Influenza Vaccine  03/11/2012  . Mammogram  11/07/2012  . Pap Smear  09/09/2013  . Colonoscopy  09/25/2017  . Tetanus/tdap  10/13/2020    Assessment:  Procedures due: PAP, PFTs  Labs due: lipid panel, BMET, CBC.  Immunizations due: PCV, flu shot  Plan:  PCV and flu shot today.  Will arrange for pulmonary function tests.  PAP today.

## 2012-03-29 NOTE — Assessment & Plan Note (Signed)
Assessment: Patient is uncontrolled of her asthma, requiring albuterol three times a week. However, she cannot afford inhaled steroids at present as her orange card has lapsed. Financial constraints continue to be the limiting factor towards her getting inhaled steroids.  Plan:      Will continue albuterol.  Patient again urged to follow-up with Chauncey Reading to get orange card approval (if she qualifies).  Will get PFTs.  Will plan to start inhaled steroid as soon as she gets orange card / MAP.

## 2012-03-29 NOTE — Assessment & Plan Note (Addendum)
Pertinent Labs: Liver Function Tests:    Component Value Date/Time   AST 15 11/17/2011 1656   ALT 13 11/17/2011 1656   ALKPHOS 98 11/17/2011 1656   BILITOT 0.5 11/17/2011 1656   PROT 6.8 11/17/2011 1656   ALBUMIN 4.3 11/17/2011 1656    Lipid Panel:     Component Value Date/Time   CHOL 215* 11/17/2011 1656   TRIG 111 11/17/2011 1656   HDL 48 11/17/2011 1656   CHOLHDL 4.5 11/17/2011 1656   VLDL 22 11/17/2011 1656   LDLCALC 145* 11/17/2011 1656     Assessment: Goal LDL (per ATP guidelines): < 130 mg/dL  Disease Control: not controlled  Progress toward goals: unable to assess  Barriers to meeting goals: no barriers identified    Was supposed to start on pravastatin in May 2013, at which time LDL was 140 mg/dL - however, she was nervous about the side effects and therefore did not start the medication.    Plan:  continue current medications - patient was instructed to start her pravastatin.  We discussed potential side effects of the medication, for which she should be aware. She was advised to stop the medication and consult medical personnel if symptoms arise.  Will plan to recheck lipid panel at her next visit and adjust regimen as appropriate to obtain an LDL goal of less than 130.

## 2012-03-29 NOTE — Assessment & Plan Note (Signed)
Pertinent Data: BP Readings from Last 3 Encounters:  03/29/12 152/82  01/10/12 150/79  01/04/12 162/66    Basic Metabolic Panel:    Component Value Date/Time   NA 142 11/17/2011 1656   K 4.0 11/17/2011 1656   CL 107 11/17/2011 1656   CO2 25 11/17/2011 1656   BUN 13 11/17/2011 1656   CREATININE 0.70 11/17/2011 1656   CREATININE 0.69 06/10/2011 1130   GLUCOSE 87 11/17/2011 1656   CALCIUM 10.0 11/17/2011 1656    Assessment: Disease Control: not controlled  Progress toward goals: unchanged  Barriers to meeting goals: nonadherence to medications    Missing 1 x per week at least.    Plan:  continue current medications  Add Amlodipine 5mg  daily - will send to [facility] ($16 for 1 month supply) - will ask triage to call pt to inform.  She needs to get her orange card/ MAP updated so she can get help with medication payment.

## 2012-03-29 NOTE — Assessment & Plan Note (Signed)
Assessment: Unclear cause of cramping. Certainly could be contributed by her significant weight above goal. She is otherwise also on antihypertensives that could potentiate electrolyte abnormalities to contribute (thought to be less likely).  Plan:      Will check CMET.  Will check CPK.

## 2012-03-29 NOTE — Assessment & Plan Note (Signed)
Assessment: Likely cause of her itchy rash between legs and intertriginous area.  Plan:      Will start Nystatin powder 4 times daily to affected areas.

## 2012-03-30 LAB — COMPREHENSIVE METABOLIC PANEL
ALT: <8 U/L (ref 0–35)
AST: 12 U/L (ref 0–37)
Albumin: 4.1 g/dL (ref 3.5–5.2)
Alkaline Phosphatase: 91 U/L (ref 39–117)
BUN: 11 mg/dL (ref 6–23)
CO2: 25 mEq/L (ref 19–32)
Calcium: 9.8 mg/dL (ref 8.4–10.5)
Chloride: 106 mEq/L (ref 96–112)
Creat: 0.8 mg/dL (ref 0.50–1.10)
Glucose, Bld: 91 mg/dL (ref 70–99)
Potassium: 3.8 mEq/L (ref 3.5–5.3)
Sodium: 139 mEq/L (ref 135–145)
Total Bilirubin: 0.5 mg/dL (ref 0.3–1.2)
Total Protein: 6.9 g/dL (ref 6.0–8.3)

## 2012-03-30 LAB — CK: Total CK: 48 U/L (ref 7–177)

## 2012-03-30 NOTE — Progress Notes (Signed)
Quick Note:  Renal and hepatic function look good, CK total within normal limits. ______

## 2012-03-30 NOTE — Progress Notes (Signed)
Quick Note:  No indication of anemia. CBC looks good. ______

## 2012-04-02 ENCOUNTER — Encounter: Payer: Self-pay | Admitting: *Deleted

## 2012-04-02 ENCOUNTER — Telehealth: Payer: Self-pay | Admitting: *Deleted

## 2012-04-02 DIAGNOSIS — E785 Hyperlipidemia, unspecified: Secondary | ICD-10-CM

## 2012-04-02 NOTE — Telephone Encounter (Signed)
CALLED PATIENT, UNABLE TO LEAVE VOICE MESSAGE ON CELL PHONE.[name] SENDING LETTER WITH APPT INFO. [name] NT 9/23/013 @11 :36AM

## 2012-04-10 ENCOUNTER — Ambulatory Visit (HOSPITAL_COMMUNITY)
Admission: RE | Admit: 2012-04-10 | Discharge: 2012-04-10 | Disposition: A | Discharge status: Home / Self Care | Payer: Self-pay | Source: Ambulatory Visit | Attending: Internal Medicine | Admitting: Internal Medicine

## 2012-04-10 DIAGNOSIS — J45909 Unspecified asthma, uncomplicated: Secondary | ICD-10-CM | POA: Insufficient documentation

## 2012-04-10 MED ORDER — ALBUTEROL SULFATE (5 MG/ML) 0.5% IN NEBU
2.5000 mg | INHALATION_SOLUTION | Freq: Once | RESPIRATORY_TRACT | Status: AC
  Administered 2012-04-10: 2.5 mg via RESPIRATORY_TRACT

## 2012-04-30 ENCOUNTER — Other Ambulatory Visit: Payer: Self-pay | Admitting: Internal Medicine

## 2012-05-08 ENCOUNTER — Other Ambulatory Visit: Payer: Self-pay | Admitting: Internal Medicine

## 2012-05-08 DIAGNOSIS — Z1231 Encounter for screening mammogram for malignant neoplasm of breast: Secondary | ICD-10-CM

## 2012-05-09 ENCOUNTER — Ambulatory Visit (HOSPITAL_COMMUNITY)
Admission: RE | Admit: 2012-05-09 | Discharge: 2012-05-09 | Disposition: A | Discharge status: Home / Self Care | Payer: Self-pay | Source: Ambulatory Visit | Attending: Internal Medicine | Admitting: Internal Medicine

## 2012-05-09 DIAGNOSIS — Z1231 Encounter for screening mammogram for malignant neoplasm of breast: Secondary | ICD-10-CM

## 2012-05-10 ENCOUNTER — Ambulatory Visit (INDEPENDENT_AMBULATORY_CARE_PROVIDER_SITE_OTHER): Payer: Self-pay | Admitting: Internal Medicine

## 2012-05-10 ENCOUNTER — Encounter: Payer: Self-pay | Admitting: Internal Medicine

## 2012-05-10 VITALS — BP 138/84 | HR 71 | Temp 98.0°F | Ht 65.0 in | Wt 330.1 lb

## 2012-05-10 DIAGNOSIS — I10 Essential (primary) hypertension: Secondary | ICD-10-CM

## 2012-05-10 DIAGNOSIS — J45909 Unspecified asthma, uncomplicated: Secondary | ICD-10-CM

## 2012-05-10 DIAGNOSIS — E785 Hyperlipidemia, unspecified: Secondary | ICD-10-CM

## 2012-05-10 DIAGNOSIS — B372 Candidiasis of skin and nail: Secondary | ICD-10-CM

## 2012-05-10 DIAGNOSIS — D649 Anemia, unspecified: Secondary | ICD-10-CM

## 2012-05-10 DIAGNOSIS — Z0000 Encounter for general adult medical examination without abnormal findings: Secondary | ICD-10-CM

## 2012-05-10 DIAGNOSIS — M549 Dorsalgia, unspecified: Secondary | ICD-10-CM

## 2012-05-10 MED ORDER — LISINOPRIL-HYDROCHLOROTHIAZIDE 20-12.5 MG PO TABS
2.0000 | ORAL_TABLET | Freq: Every day | ORAL | Status: DC
Start: 2012-05-10 — End: 2013-03-06

## 2012-05-10 MED ORDER — PRAVASTATIN SODIUM 20 MG PO TABS: 20.0000 mg | ORAL_TABLET | Freq: Every evening | ORAL | Status: DC

## 2012-05-10 MED ORDER — IBUPROFEN 800 MG PO TABS: 800.0000 mg | ORAL_TABLET | Freq: Three times a day (TID) | ORAL | Status: DC

## 2012-05-10 MED ORDER — NYSTATIN 100000 UNIT/GM EX POWD: Freq: Four times a day (QID) | CUTANEOUS | Status: DC

## 2012-05-10 MED ORDER — ALBUTEROL SULFATE HFA 108 (90 BASE) MCG/ACT IN AERS: 2.0000 | INHALATION_SPRAY | RESPIRATORY_TRACT | Status: DC | PRN

## 2012-05-10 MED ORDER — FERROUS SULFATE 325 (65 FE) MG PO TABS: 325.0000 mg | ORAL_TABLET | Freq: Two times a day (BID) | ORAL | Status: DC

## 2012-05-10 MED ORDER — METHOCARBAMOL 500 MG PO TABS: 500.0000 mg | ORAL_TABLET | Freq: Three times a day (TID) | ORAL | Status: DC | PRN

## 2012-05-10 NOTE — Assessment & Plan Note (Addendum)
Pertinent Labs: Liver Function Tests:    Component Value Date/Time   AST 12 03/29/2012 1435   ALT <8 03/29/2012 1435   ALKPHOS 91 03/29/2012 1435   BILITOT 0.5 03/29/2012 1435   PROT 6.9 03/29/2012 1435   ALBUMIN 4.1 03/29/2012 1435    Lipid Panel:     Component Value Date/Time   CHOL 215* 11/17/2011 1656   TRIG 111 11/17/2011 1656   HDL 48 11/17/2011 1656   CHOLHDL 4.5 11/17/2011 1656   VLDL 22 11/17/2011 1656   LDLCALC 145* 11/17/2011 1656     Assessment: Goal LDL (per ATP guidelines): < 130 mg/dL  Disease Control: not controlled  Progress toward goals: unable to assess  Barriers to meeting goals: no barriers identified    Was supposed to start on pravastatin in May 2013, at which time LDL was 145 mg/dL.    She finally started her pravastatin during our last visit together in 03/2012.   Plan:  continue current medications   Check lipid panel today.  ADDENDUM TO PLAN AFTER LABS RESULTED: Pertinent Labs: Lab Results  Component Value Date   CHOL 204* 05/10/2012   HDL 40 05/10/2012   LDLCALC 147* 05/10/2012   TRIG 87 05/10/2012   CHOLHDL 5.1 05/10/2012   Assessment/ Plan:  Per ATP III guidelines, LDL goal is < 130 mg/dL, which she has not achieved despite starting pravastatin.   Will therefore, need to escalate dosage.  Will ask triage to call pt to inform her of results, and that I am sending in a new prescription to her pharmacy.  Will plan to recheck lipid panel at next follow-up (approximately 6 weeks).

## 2012-05-10 NOTE — Assessment & Plan Note (Signed)
Assessment: Patient is uncontrolled of her asthma, requiring albuterol three times a week. However, she cannot afford inhaled steroids at present as her orange card has lapsed. Financial constraints continue to be the limiting factor towards her getting inhaled steroids.  Plan:      Will continue albuterol for now, but she needs an inhaled steroid.  Patient again urged to follow-up with Chauncey Reading to get orange card approval (if she qualifies) / Medicaid to clarify insurance coverage.  Pending PFTs.  Will plan to start inhaled steroid as soon as she gets orange card / MAP.

## 2012-05-10 NOTE — Assessment & Plan Note (Signed)
Pertinent Imaging: 4-view L spine XR (01/2009) - Normal alignment with normal disc spaces. Minimal degenerative  change involving the facet joints of L4-5 and L5-S1.  Assessment: Location of pain, that it is very tender to palpation of the musculature and is not pinpoint is highly suggestive of muscular strain. There is no midline tenderness to palpation over spine, straight leg raise is negative, and no other indication of radicular or skeletal pain.  Plan:      Increase dosing of her ibuprofen, instructed to take TID for two weeks with food.  Flexeril is ineffective for her --> will discontinue.  Start Robaxin - patient instructed that she has been started on a new medication that can cause drowsiness, do not drive or operate heavy machinery. Do not take this medication with alcohol.

## 2012-05-10 NOTE — Assessment & Plan Note (Signed)
Health Maintenance  Topic Date Due  . Mammogram  11/07/2012  . Influenza Vaccine  03/11/2013  . Pap Smear  03/30/2015  . Colonoscopy  09/25/2017  . Tetanus/tdap  10/13/2020    Assessment:  Procedures due: None  Labs due: None  Immunizations due: None  Plan:  Continue to follow-up.

## 2012-05-10 NOTE — Patient Instructions (Signed)
   Please follow-up at the clinic in 1 month, at which time we will reevaluate your back pain - OR, please follow-up in the clinic sooner if needed.  There have been changes in your medications:  STOP Flexeril  START Robaxin (for muscle spasms) - You have been started on a new medication that can cause drowsiness, do not drive or operate heavy machinery . Do not take this medication with alcohol.    START Ibuprofen three times a day.   Refills sent to your pharmacy.  You are getting labs today, if they are abnormal I will give you a call.  If you have been started on new medication(s), and you develop symptoms concerning for allergic reaction, including, but not limited to, throat closing, tongue swelling, rash, please stop the medication immediately and call the clinic at [phone], and go to the ER.  If symptoms worsen, or new symptoms arise, please call the clinic or go to the ER.  Please bring all of your medications in a bag to your next visit.   Back Exercises Back exercises help treat and prevent back injuries. The goal of back exercises is to increase the strength of your abdominal and back muscles and the flexibility of your back. These exercises should be started when you no longer have back pain. Back exercises include:  Pelvic Tilt. Lie on your back with your knees bent. Tilt your pelvis until the lower part of your back is against the floor. Hold this position 5 to 10 sec and repeat 5 to 10 times.   Knee to Chest. Pull first 1 knee up against your chest and hold for 20 to 30 seconds, repeat this with the other knee, and then both knees. This may be done with the other leg straight or bent, whichever feels better.   Sit-Ups or Curl-Ups. Bend your knees 90 degrees. Start with tilting your pelvis, and do a partial, slow sit-up, lifting your trunk only 30 to 45 degrees off the floor. Take at least 2 to 3 seconds for each sit-up. Do not do sit-ups with your knees out straight. If  partial sit-ups are difficult, simply do the above but with only tightening your abdominal muscles and holding it as directed.   Hip-Lift. Lie on your back with your knees flexed 90 degrees. Push down with your feet and shoulders as you raise your hips a couple inches off the floor; hold for 10 seconds, repeat 5 to 10 times.   Back arches. Lie on your stomach, propping yourself up on bent elbows. Slowly press on your hands, causing an arch in your low back. Repeat 3 to 5 times. Any initial stiffness and discomfort should lessen with repetition over time.   Shoulder-Lifts. Lie face down with arms beside your body. Keep hips and torso pressed to floor as you slowly lift your head and shoulders off the floor.  Do not overdo your exercises, especially in the beginning. Exercises may cause you some mild back discomfort which lasts for a few minutes; however, if the pain is more severe, or lasts for more than 15 minutes, do not continue exercises until you see your caregiver. Improvement with exercise therapy for back problems is slow.   See your caregivers for assistance with developing a proper back exercise program. Document Released: 08/04/2004 Document Revised: 09/19/2011 Document Reviewed: 04/28/2011 [facility] Patient Information 2013 [place], [place].

## 2012-05-10 NOTE — Assessment & Plan Note (Signed)
Pertinent Data: BP Readings from Last 3 Encounters:  05/10/12 138/84  03/29/12 152/82  01/10/12 150/79    Basic Metabolic Panel:    Component Value Date/Time   NA 139 03/29/2012 1435   K 3.8 03/29/2012 1435   CL 106 03/29/2012 1435   CO2 25 03/29/2012 1435   BUN 11 03/29/2012 1435   CREATININE 0.80 03/29/2012 1435   CREATININE 0.69 06/10/2011 1130   GLUCOSE 91 03/29/2012 1435   CALCIUM 9.8 03/29/2012 1435    Assessment: Disease Control: controlled  Progress toward goals: improved  Barriers to meeting goals: nonadherence to medications    Missing 1 x per week at least.    She has amazingly lost some weight, and this at least partially may be reflecting her improved control.  Never started the Amlodipine that was supposed to be initiated at the last visit.  Given improved control and weight loss, will not restart at this time.  Plan:  continue current medications - just lisinopril-HCTZ combo pill.  Encouraged patient about her weight loss.  She needs to get her orange card/ MAP updated so she can get help with medication payment.

## 2012-05-10 NOTE — Progress Notes (Signed)
Subjective:    Patient: [name]   Age/ Gender: 56 y.o., female   MRN: [id number]  DOB: [birth year]     HPI: [name] is a 56 y.o. with PMHx of HLD, iron-deficiency anemia, DJD, morbid obesity (Body mass index is 55.68 kg/(m^2).) who presents to clinic today for the following:   1) HTN - Patient does check blood pressure regularly at home - usually gets 148/70s  Currently taking lisinopril-HCTZ x 2 pills daily and was supposed to start Amlodipine 5mg  during last visit 03/2012 (which she has not started). Patient misses doses 1 x per week on average. denies headaches, chest pain, shortness of breath. does not request refills today.   2) HLD - currently taking Pravastatin 20 mg daily - which was started in 11/2011 in response to LDL of 145 mg/dL with goal of < 657 mg/dL. She had not started it yet during out visit on 03/2012. Today, she reports that she did started the pravastatin, and did not experience muscle aches, pains, hematuria with the onset of the medication. denies chest pain, difficulty breathing, palpitations, tachycardia.  Does request refills today.    3) Candidal intertrigo - patient notes that in her intertriginous areas and between thigh, she is having significant itching without rash, has also noted darkening of skin. No fevers, chills, skin lesions noted. This has improved from starting Nystatin powder in 03/2012 once daily.  4) Asthma - Patient notes that currently her asthma is well controlled, however, she has had an asthma exacerbation as recently as 12/2011 and continues to use her albuterol three times daily for wheezing (is not currently on maintenance therapy because cannot afford inhaled steroids). Denies cough, fevers, chills, shortness of breath. Has gotten the PFTs but no results.   5) Back pain - Patient describes a 4 month history of worsening low back pain, worst over last 2 weeks - and it is now sharp, constant back pain located over left lumbar area.  Currently rated 8/10 in severity. At its worst, the pain is rated 10/10 in severity. Aggravating factors include: bending over, twisting. Alleviating factors include: NSAIDs and rest. Patient denies weakness, numbness, tingling, leg weakness, perianal numbness. No falls, trauma otherwise. Denies dysuria, hematuria.   Review of Systems: Per HPI.    Current Outpatient Medications: Medication Sig  . albuterol (PROVENTIL HFA;VENTOLIN HFA) 108 (90 BASE) MCG/ACT inhaler Inhale 2 puffs into the lungs every 4 (four) hours as needed for wheezing or shortness of breath.  [name] amLODipine (NORVASC) 5 MG tablet Take 1 tablet (5 mg total) by mouth daily.  . cyclobenzaprine (FLEXERIL) 10 MG tablet Take 10 mg by mouth 2 (two) times daily.  . ferrous sulfate 325 (65 FE) MG tablet Take 1 tablet (325 mg total) by mouth 2 (two) times daily with a meal.  . ibuprofen (ADVIL,MOTRIN) 800 MG tablet Take 800 mg by mouth at bedtime as needed. For pain  . lisinopril-hydrochlorothiazide (ZESTORETIC) 20-12.5 MG per tablet Take 2 tablets by mouth daily.  [name] nystatin (MYCOSTATIN) powder Apply topically 4 (four) times daily.  . pravastatin (PRAVACHOL) 20 MG tablet Take 1 tablet (20 mg total) by mouth every evening.     Allergies  Allergen Reactions  . Ketorolac Tromethamine Shortness Of Breath and Palpitations  . Atrovent (Ipratropium) Hives and Rash  . Latex Other (See Comments)    Powdered. Confirm type of reaction with patient.    Past Medical History  Diagnosis Date  . Hypertension   . Hyperlipidemia   .  Asthma   . Obesity   . Lower extremity edema     Chronic. 2D echo (2005) - EF 65%.  . Seasonal allergies   . Abnormal EKG 06/2003    History of inverted T waves V1-V3. Normal 2D echo (07/23/2003): LVEF 65%.  . History of multiple pulmonary nodules     Incidental finding: CT Abd/ Pelvis (04/2010) - Several small lower lobe lung nodules, including one pure ground-glass pulmonary nodule measuring 8 mm in the  left lower lobe.  Recommend follow-up chest CT (IV contrast preferred) in 6 months to document stability. //  CT Abd/ Pelvis (07/2010) -  3 mm RLL and  8 mm LLL nodule stable.  Other nodules unchanged, likely benign.  . 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 Followed by Sports Medicine, Dr. Jennette Kettle.  . Incarcerated ventral hernia 04/2010    Noted on CT Abd/ pelvis (04/2010). Patient now s/p ventral hernia repair by Dr. Gerrit Friends (12/2010)  . Anemia     BL Hgb 11-12. Ferritin 14 - low normal (08/2007). Colonoscopy 2009 - external hemorrhoids (excellent prep). Last anemia panel (12/2010) - Iron  24, TIBC 269,  B12  316, Folate 11.9, Ferritin 73.  . Night sweats   . Dizziness   . Fatigue     loss of sleep  . Wears dentures   . Wears glasses   . 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 Incision and drainage abscess anal 07/2008    I&D and debridgement of anorectal abscess  . Inguinal hernia repair   . Tubal ligation   . Incisional hernia repair 12/2010    Repair of incarcerated ventral incisional hernia with Ethicon mesh patch - performed by Dr. Gerrit Friends.   . Foot surgery 2004     left  . Total knee arthroplasty 06/16/2011    Left TOTAL KNEE ARTHROPLASTY;  Surgeon: Kennieth Rad;  Location: [facility] OR;  Service: Orthopedics;  Laterality: Left;  left total knee arthroplasty     Objective:    Physical Exam: Filed Vitals:   05/10/12 1340  BP: 138/84  Pulse: 71  Temp: 98 F (36.7 C)      General: Vital signs reviewed and noted. Well-developed, well-nourished, in no acute distress; alert, appropriate and cooperative throughout examination.  Head: Normocephalic, atraumatic.  Lungs:  Normal respiratory effort. Clear to auscultation BL without crackles or wheezes.  Heart: RRR. S1 and S2 normal without gallop, rubs. (+) systolic murmur.  Abdomen:  BS normoactive. Soft, Nondistended, non-tender.  No  masses or organomegaly.  Extremities: LLE - No pretibial edema. Thighs - Significant improvement of hyperpigmentation of skin between thighs, rash improved.  Back - negative SLR bilaterally, no pain elicited to internal and external rotation of the hip. Tenderness to palpation of muscles / fat over left lateral costovertebral angle, tender to palpation of muscularture. No obvious asymmetry (as compared to right side) or mass appreciated. No pinpoint tenderness over spine or over ribs. No areas of ecchymosis.    Assessment/ Plan:   Case and plan of care discussed with attending physician, Dr. Debe Coder.

## 2012-05-10 NOTE — Assessment & Plan Note (Signed)
Assessment: Improving at this time. Rash looks much better!.  Plan:      Continue Nystatin if rash recurs. RX sent.

## 2012-05-11 LAB — LIPID PANEL
Cholesterol: 204 mg/dL — ABNORMAL HIGH (ref 0–200)
HDL: 40 mg/dL (ref >39)
LDL Cholesterol: 147 mg/dL — ABNORMAL HIGH (ref 0–99)
Total CHOL/HDL Ratio: 5.1 Ratio
Triglycerides: 87 mg/dL (ref <150)
VLDL: 17 mg/dL (ref 0–40)

## 2012-05-11 MED ORDER — PRAVASTATIN SODIUM 40 MG PO TABS: 40.0000 mg | ORAL_TABLET | Freq: Every evening | ORAL | Status: DC

## 2012-05-11 NOTE — Progress Notes (Signed)
Message left for pt to call clinic.

## 2012-05-11 NOTE — Progress Notes (Signed)
Quick Note:  LDL not at goal, will escalate therapy. See note for details. ______

## 2012-05-11 NOTE — Addendum Note (Signed)
Addended by: Priscella Mann on: 05/11/2012 07:10 AM   Modules accepted: Orders

## 2012-05-17 ENCOUNTER — Encounter: Payer: Self-pay | Admitting: Internal Medicine

## 2012-05-17 ENCOUNTER — Ambulatory Visit (INDEPENDENT_AMBULATORY_CARE_PROVIDER_SITE_OTHER): Payer: Self-pay | Admitting: Internal Medicine

## 2012-05-17 VITALS — BP 159/87 | HR 77 | Temp 98.2°F | Ht 66.0 in | Wt 329.8 lb

## 2012-05-17 DIAGNOSIS — M549 Dorsalgia, unspecified: Secondary | ICD-10-CM

## 2012-05-17 DIAGNOSIS — I10 Essential (primary) hypertension: Secondary | ICD-10-CM

## 2012-05-17 DIAGNOSIS — R197 Diarrhea, unspecified: Secondary | ICD-10-CM

## 2012-05-17 LAB — BASIC METABOLIC PANEL
BUN: 7 mg/dL (ref 6–23)
CO2: 25 mEq/L (ref 19–32)
Calcium: 10.6 mg/dL — ABNORMAL HIGH (ref 8.4–10.5)
Chloride: 103 mEq/L (ref 96–112)
Creat: 0.75 mg/dL (ref 0.50–1.10)
Glucose, Bld: 90 mg/dL (ref 70–99)
Potassium: 3.6 mEq/L (ref 3.5–5.3)
Sodium: 140 mEq/L (ref 135–145)

## 2012-05-17 LAB — URINALYSIS
Bilirubin Urine: NEGATIVE
Glucose, UA: NEGATIVE mg/dL
Hgb urine dipstick: NEGATIVE
Leukocytes, UA: NEGATIVE
Nitrite: NEGATIVE
Protein, ur: NEGATIVE mg/dL
Specific Gravity, Urine: 1.025 (ref 1.005–1.030)
Urobilinogen, UA: 1 mg/dL (ref 0.0–1.0)
pH: 5.5 (ref 5.0–8.0)

## 2012-05-17 LAB — CBC WITH DIFFERENTIAL/PLATELET
Basophils Absolute: 0.1 10*3/uL (ref 0.0–0.1)
Basophils Relative: 1 % (ref 0–1)
Eosinophils Absolute: 0.2 10*3/uL (ref 0.0–0.7)
Eosinophils Relative: 4 % (ref 0–5)
HCT: 40.4 % (ref 36.0–46.0)
Hemoglobin: 13 g/dL (ref 12.0–15.0)
Lymphocytes Relative: 47 % — ABNORMAL HIGH (ref 12–46)
Lymphs Abs: 2.6 10*3/uL (ref 0.7–4.0)
MCH: 28.4 pg (ref 26.0–34.0)
MCHC: 32.2 g/dL (ref 30.0–36.0)
MCV: 88.2 fL (ref 78.0–100.0)
Monocytes Absolute: 0.3 10*3/uL (ref 0.1–1.0)
Monocytes Relative: 6 % (ref 3–12)
Neutro Abs: 2.3 10*3/uL (ref 1.7–7.7)
Neutrophils Relative %: 42 % — ABNORMAL LOW (ref 43–77)
Platelets: 312 10*3/uL (ref 150–400)
RBC: 4.58 MIL/uL (ref 3.87–5.11)
RDW: 14 % (ref 11.5–15.5)
WBC: 5.4 10*3/uL (ref 4.0–10.5)

## 2012-05-17 MED ORDER — HYDROCODONE-ACETAMINOPHEN 5-500 MG PO TABS: 2.0000 | ORAL_TABLET | Freq: Four times a day (QID) | ORAL | Status: DC | PRN

## 2012-05-17 MED ORDER — LOPERAMIDE HCL 2 MG PO TABS: 2.0000 mg | ORAL_TABLET | Freq: Four times a day (QID) | ORAL | Status: DC | PRN

## 2012-05-17 NOTE — Progress Notes (Signed)
Patient ID: [name], female   DOB: 14-Oct-1955, 56 y.o.   MRN: [id number]  Subjective:   Patient ID: [name] female   DOB: Jun 06, 1956 56 y.o.   MRN: [id number]  HPI: [name] is a 56 y.o. with past medical history of hypertension, morbid obesity, hyperlipidemia, and asthma, who presents for evaluation for diarrhea for one week.   Diarrhea: She reports that since last, week she's been experiencing episodes of diarrhea, happening about 3 times per day. She denies history of passing blood in her stool. She described the appearance of her stool has being watery and pale grey in color. She has associated history of abdominal pain that is generalized but mainly in her lower abdomen towards the left lower quadrant. She denies history of vomiting, but she's been feeling nauseous. She admits to a history of fever and chills associated with her symptoms. She has not been in contact with people with similar symptoms. Her appetite has been poor since onset of symptoms. There is no recent change in her diet preceding the onset of symptoms.  Back pain: The patient reports, that she's been experiencing severe back pain for the last 2 weeks. She's been using Percocet and ibuprofen at home without relief. She reports that this has "messed up" her blood pressure, and it's been unstable. She is very distressed about her high blood pressure, which she regularly takes at home and been in the ranges of 159/90s. She reports compliance with her regular medications for blood pressure. Then, no associated symptoms of weakness in her legs, headaches, or loss of sensation. She continues to have significant pain in her other joints including the knees. She request for pain medication and she believes that this will help stabilize her blood pressure.   Of note, the patient has difficulties affording her medications since she doesn't have insurance and she doesn't have a job. She is working on applying for  disability.    Past Medical History  Diagnosis Date  . Hypertension   . Hyperlipidemia   . Asthma   . Obesity   . Lower extremity edema     Chronic. 2D echo (2005) - EF 65%.  . Seasonal allergies   . Abnormal EKG 06/2003    History of inverted T waves V1-V3. Normal 2D echo (07/23/2003): LVEF 65%.  . History of multiple pulmonary nodules     Incidental finding: CT Abd/ Pelvis (04/2010) - Several small lower lobe lung nodules, including one pure ground-glass pulmonary nodule measuring 8 mm in the left lower lobe.  Recommend follow-up chest CT (IV contrast preferred) in 6 months to document stability. //  CT Abd/ Pelvis (07/2010) -  3 mm RLL and  8 mm LLL nodule stable.  Other nodules unchanged, likely benign.  . 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 Followed by Sports Medicine, Dr. Jennette Kettle.  . Incarcerated ventral hernia 04/2010    Noted on CT Abd/ pelvis (04/2010). Patient now s/p ventral hernia repair by Dr. Gerrit Friends (12/2010)  . Anemia     BL Hgb 11-12. Ferritin 14 - low normal (08/2007). Colonoscopy 2009 - external hemorrhoids (excellent prep). Last anemia panel (12/2010) - Iron  24, TIBC 269,  B12  316, Folate 11.9, Ferritin 73.  . Night sweats   . Dizziness   . Fatigue     loss of sleep  . Wears dentures   . Wears glasses   . Knee osteoarthritis     s/p  Left total knee replacement (06/2011)   Current Outpatient Prescriptions  Medication Sig Dispense Refill  . albuterol (PROVENTIL HFA;VENTOLIN HFA) 108 (90 BASE) MCG/ACT inhaler Inhale 2 puffs into the lungs every 4 (four) hours as needed for wheezing or shortness of breath.  1 Inhaler  0  . ferrous sulfate 325 (65 FE) MG tablet Take 1 tablet (325 mg total) by mouth 2 (two) times daily with a meal.  60 tablet  3  . ibuprofen (ADVIL,MOTRIN) 800 MG tablet Take 1 tablet (800 mg total) by mouth 3 (three) times daily with meals. For pain  42 tablet  0  . lisinopril-hydrochlorothiazide (ZESTORETIC) 20-12.5 MG per  tablet Take 2 tablets by mouth daily.  60 tablet  3  . methocarbamol (ROBAXIN) 500 MG tablet Take 1 tablet (500 mg total) by mouth 3 (three) times daily as needed.  40 tablet  0  . pravastatin (PRAVACHOL) 40 MG tablet Take 1 tablet (40 mg total) by mouth every evening.  30 tablet  3  . HYDROcodone-acetaminophen (VICODIN) 5-500 MG per tablet Take 2 tablets by mouth every 6 (six) hours as needed for pain.  20 tablet  0  . loperamide (IMODIUM A-D) 2 MG tablet Take 1 tablet (2 mg total) by mouth 4 (four) times daily as needed for diarrhea or loose stools.  30 tablet  0  . nystatin (MYCOSTATIN) powder Apply topically 4 (four) times daily.  15 g  0   Family History  Problem Relation Age of Onset  . Diabetes Mother   . Hypertension Mother   . Stroke Mother   . Heart attack Neg Hx   . Dementia Father    History   Social History  . Marital Status: Married    Spouse Name: N/A    Number of Children: N/A  . Years of Education: N/A   Social History Main Topics  . Smoking status: Former Smoker -- 0.5 packs/day for 20 years    Types: Cigarettes    Quit date: 09/10/1998  . Smokeless tobacco: None  . Alcohol Use: No  . Drug Use: No  . Sexually Active: None   Other Topics Concern  . None   Social History Narrative  . None   Review of Systems: Review of Systems  HENT: Negative for hearing loss, ear pain, sore throat, neck pain and ear discharge.   Eyes: Negative.   Respiratory: Negative.  Negative for stridor.   Cardiovascular: Negative for chest pain, palpitations, orthopnea, claudication, leg swelling and PND.  Gastrointestinal: Positive for nausea.  Genitourinary: Negative.   Musculoskeletal: Positive for back pain. Negative for myalgias and joint pain.  Skin: Negative.   Neurological: Negative.  Negative for headaches.  Psychiatric/Behavioral: Negative.     Objective:  Physical Exam: Filed Vitals:   05/17/12 1506  BP: 159/87  Pulse: 77  Temp: 98.2 F (36.8 C)  TempSrc:  Oral  Height: 5\' 6"  (1.676 m)  Weight: 329 lb 12.8 oz (149.596 kg)  SpO2: 99%   Physical Exam  Constitutional: She is oriented to person, place, and time. She appears distressed.  HENT:  Head: Normocephalic and atraumatic.  Eyes: Conjunctivae normal and EOM are normal. Pupils are equal, round, and reactive to light.  Neck: Normal range of motion. Neck supple.  Cardiovascular: Normal rate, regular rhythm and normal heart sounds.  Exam reveals no gallop and no friction rub.   No murmur heard. Pulmonary/Chest: Effort normal and breath sounds normal. No respiratory distress. She has no wheezes. She has  no rales. She exhibits no tenderness.  Abdominal: Bowel sounds are normal. She exhibits no distension and no mass. There is no hepatosplenomegaly, splenomegaly or hepatomegaly. There is tenderness in the suprapubic area and left lower quadrant. There is no rigidity, no rebound, no guarding, no CVA tenderness, no tenderness at McBurney's point and negative Murphy's sign.  Genitourinary: Rectum normal. Rectal exam shows no external hemorrhoid, no internal hemorrhoid, no fissure, no laceration, no mass and no tenderness. Guaiac negative stool.  Musculoskeletal: She exhibits no edema and no tenderness.       Thoracic back: She exhibits tenderness. She exhibits normal range of motion, no bony tenderness, no swelling, no edema, no deformity, no laceration, no pain, no spasm and normal pulse.  Neurological: She is alert and oriented to person, place, and time.  Skin: Skin is warm and dry. She is not diaphoretic.  Psychiatric: Affect normal.    Assessment & Plan:  The assessment and plan for the care of this patient has been discussed with Dr. Aundria Rud and documented in program-based charting.   In the brief, the patient has been evaluated and will be treated as acute diarrhea with Imodium and Vicodin for severe back pain. She'll be evaluated in one week. She's been advised to call the clinic if her  symptoms worsen. CBC, urinalysis, fecal occult blood stool and basic metabolic panel of been normal. She'll followup in one week.

## 2012-05-17 NOTE — Assessment & Plan Note (Addendum)
Ms. Vito, blood pressure today is 158/87. The patient appears very distressed about her blood pressure not being normal, and she believes that her back pain is messing up her blood pressure. She is in the clinic with her home blood pressure machine. She is asking for pain medications to treat the back pain, and she believes this will bring down her blood pressure. She reports compliance with her current regimen of lisinopril and hydrochlorothiazide. I have reassured her that her blood pressure is not dangerously high at this time.  Plan -Continue with the current regimen. - Vicodin for back pain.

## 2012-05-17 NOTE — Patient Instructions (Addendum)
Please start taking Imodium for 5 days for the treatment of diarrhea Please stop taking Ibuprofen since it might cause you abdominal pain worse Please start taking Tylenol for the treatment of your back and abdominal pain. We will contact you with the results of urine examination. The results from the blood check are normal  If your symptoms do not improve in 2 days, please call the clinic otherwise come back in a week.      Diarrhea Diarrhea is watery poop (stool). The most common cause of diarrhea is a germ. Other causes include:  Food poisoning.  A reaction to medicine. HOME CARE   Drink clear fluids. This can stop you from losing too much body fluid (dehydration).  Drink enough fluids to keep your pee (urine) clear or pale yellow.  Avoid solid foods and dairy products until you start to feel better. Then start eating bland foods, such as:  Bananas.  Rice.  Crackers.  Applesauce.  Dry toast.  Avoid spicy foods, caffeine, and alcohol.  Your doctor may give medicine to help with cramps and watery poop. Take this as told. Avoid these medicines if you have a fever or blood in your poop.  Take your medicine as told. Finish them even if you start to feel better. GET HELP RIGHT AWAY IF:   The watery poop lasts longer than 3 days.  You have a fever.  Your baby is older than 3 months with a rectal temperature of 100.5 F (38.1 C) or higher for more than 1 day.  There is blood in your poop.  You start to throw up (vomit).  You lose too much fluid. MAKE SURE YOU:   Understand these instructions.  Will watch your condition.  Will get help right away if you are not doing well or get worse. Document Released: 12/14/2007 Document Revised: 09/19/2011 Document Reviewed: 12/14/2007 [facility] Patient Information 2013 [place], [place].

## 2012-05-17 NOTE — Assessment & Plan Note (Addendum)
[name] presents with history of nonbloody diarrhea for one week. She reports subjective fevers and chills associated with nausea, but without vomiting. Vital signs in the ED revealed a temperature of 98.2. Examination of her abdomen reveals some tenderness mainly in the suprapubic and the left lower quadrant. There is no guarding or rebound tenderness. It is not a surgical abdomen. Rectal exam reveals some black stools. However, the fecal occult blood stool test is negative. She's been using iron tablets with the last does being 5 days ago. Complete blood counts is normal. Urinalysis is normal. Plan -The patient will be treated symptomatically with Imodium. -We'll reevaluate her symptoms in one week.

## 2012-05-17 NOTE — Assessment & Plan Note (Signed)
Ms. Linenberger has chronic back pain with imaging was revealed. Minimal degenerative change involving the facet joints of L4, L5 and L5-S1. She presents today with some worsening of her back pain for about 2 weeks. She believes that this is causing or blood pressure to become unstable. She is very distressed about this. She has been previously managed with Flexeril, but it was ineffective and therefore, discontinued. She reports to be using ibuprofen, Percocet, and Robaxin, but without major relief. Examination of the back does not reveal any swelling but there is significant tenderness around the thoracic and lumbar regions of the spine. Plan -I have prescribed Vicodin 20 tablets after the patient insisted that she needed something to be done for pain. She was considering going to the ED for a second opinion. After I prescribed Vicodin she was willing to try it from home, and I have encouraged her to call the clinic if her pain worsens. -She'll be evaluated in the clinic in one week.

## 2012-05-19 ENCOUNTER — Emergency Department (HOSPITAL_COMMUNITY)
Admission: EM | Admit: 2012-05-19 | Discharge: 2012-05-19 | Disposition: A | Discharge status: Home / Self Care | Payer: Self-pay | Attending: Emergency Medicine | Admitting: Emergency Medicine

## 2012-05-19 ENCOUNTER — Encounter (HOSPITAL_COMMUNITY): Payer: Self-pay | Admitting: *Deleted

## 2012-05-19 DIAGNOSIS — Z87891 Personal history of nicotine dependence: Secondary | ICD-10-CM | POA: Insufficient documentation

## 2012-05-19 DIAGNOSIS — J301 Allergic rhinitis due to pollen: Secondary | ICD-10-CM | POA: Insufficient documentation

## 2012-05-19 DIAGNOSIS — M545 Low back pain, unspecified: Secondary | ICD-10-CM | POA: Insufficient documentation

## 2012-05-19 DIAGNOSIS — I10 Essential (primary) hypertension: Secondary | ICD-10-CM | POA: Insufficient documentation

## 2012-05-19 DIAGNOSIS — E669 Obesity, unspecified: Secondary | ICD-10-CM | POA: Insufficient documentation

## 2012-05-19 DIAGNOSIS — Z79899 Other long term (current) drug therapy: Secondary | ICD-10-CM | POA: Insufficient documentation

## 2012-05-19 DIAGNOSIS — E785 Hyperlipidemia, unspecified: Secondary | ICD-10-CM | POA: Insufficient documentation

## 2012-05-19 DIAGNOSIS — IMO0002 Reserved for concepts with insufficient information to code with codable children: Secondary | ICD-10-CM | POA: Insufficient documentation

## 2012-05-19 DIAGNOSIS — Z8719 Personal history of other diseases of the digestive system: Secondary | ICD-10-CM | POA: Insufficient documentation

## 2012-05-19 DIAGNOSIS — J45909 Unspecified asthma, uncomplicated: Secondary | ICD-10-CM | POA: Insufficient documentation

## 2012-05-19 DIAGNOSIS — Z8709 Personal history of other diseases of the respiratory system: Secondary | ICD-10-CM | POA: Insufficient documentation

## 2012-05-19 DIAGNOSIS — D649 Anemia, unspecified: Secondary | ICD-10-CM | POA: Insufficient documentation

## 2012-05-19 DIAGNOSIS — M1710 Unilateral primary osteoarthritis, unspecified knee: Secondary | ICD-10-CM | POA: Insufficient documentation

## 2012-05-19 LAB — COMPREHENSIVE METABOLIC PANEL
ALT: 7 U/L (ref 0–35)
AST: 12 U/L (ref 0–37)
Albumin: 4 g/dL (ref 3.5–5.2)
Alkaline Phosphatase: 115 U/L (ref 39–117)
BUN: 10 mg/dL (ref 6–23)
CO2: 24 mEq/L (ref 19–32)
Calcium: 10.2 mg/dL (ref 8.4–10.5)
Chloride: 104 mEq/L (ref 96–112)
Creatinine, Ser: 0.75 mg/dL (ref 0.50–1.10)
GFR calc Af Amer: >90 mL/min (ref >90)
GFR calc non Af Amer: >90 mL/min (ref >90)
Glucose, Bld: 104 mg/dL — ABNORMAL HIGH (ref 70–99)
Potassium: 3.9 mEq/L (ref 3.5–5.1)
Sodium: 139 mEq/L (ref 135–145)
Total Bilirubin: 0.2 mg/dL — ABNORMAL LOW (ref 0.3–1.2)
Total Protein: 7.5 g/dL (ref 6.0–8.3)

## 2012-05-19 LAB — CBC WITH DIFFERENTIAL/PLATELET
Basophils Absolute: 0 10*3/uL (ref 0.0–0.1)
Basophils Relative: 1 % (ref 0–1)
Eosinophils Absolute: 0.3 10*3/uL (ref 0.0–0.7)
Eosinophils Relative: 5 % (ref 0–5)
HCT: 39.8 % (ref 36.0–46.0)
Hemoglobin: 12.9 g/dL (ref 12.0–15.0)
Lymphocytes Relative: 48 % — ABNORMAL HIGH (ref 12–46)
Lymphs Abs: 2.8 10*3/uL (ref 0.7–4.0)
MCH: 28.7 pg (ref 26.0–34.0)
MCHC: 32.4 g/dL (ref 30.0–36.0)
MCV: 88.4 fL (ref 78.0–100.0)
Monocytes Absolute: 0.4 10*3/uL (ref 0.1–1.0)
Monocytes Relative: 7 % (ref 3–12)
Neutro Abs: 2.3 10*3/uL (ref 1.7–7.7)
Neutrophils Relative %: 40 % — ABNORMAL LOW (ref 43–77)
Platelets: 319 10*3/uL (ref 150–400)
RBC: 4.5 MIL/uL (ref 3.87–5.11)
RDW: 14 % (ref 11.5–15.5)
WBC: 5.7 10*3/uL (ref 4.0–10.5)

## 2012-05-19 LAB — URINALYSIS, ROUTINE W REFLEX MICROSCOPIC
Bilirubin Urine: NEGATIVE
Glucose, UA: NEGATIVE mg/dL
Hgb urine dipstick: NEGATIVE
Ketones, ur: NEGATIVE mg/dL
Leukocytes, UA: NEGATIVE
Nitrite: NEGATIVE
Protein, ur: NEGATIVE mg/dL
Specific Gravity, Urine: 1.018 (ref 1.005–1.030)
Urobilinogen, UA: 1 mg/dL (ref 0.0–1.0)
pH: 7 (ref 5.0–8.0)

## 2012-05-19 LAB — URINE CULTURE
Colony Count: NO GROWTH
Organism ID, Bacteria: NO GROWTH

## 2012-05-19 MED ORDER — HYDROCODONE-ACETAMINOPHEN 5-500 MG PO TABS: 2.0000 | ORAL_TABLET | Freq: Four times a day (QID) | ORAL | Status: DC | PRN

## 2012-05-19 MED ORDER — ONDANSETRON HCL 4 MG/2ML IJ SOLN
4.0000 mg | Freq: Once | INTRAMUSCULAR | Status: AC
  Administered 2012-05-19: 4 mg via INTRAVENOUS
  Filled 2012-05-19: qty 2

## 2012-05-19 MED ORDER — HYDROMORPHONE HCL PF 1 MG/ML IJ SOLN
1.0000 mg | Freq: Once | INTRAMUSCULAR | Status: AC
  Administered 2012-05-19: 1 mg via INTRAVENOUS
  Filled 2012-05-19: qty 1

## 2012-05-19 MED ORDER — OXYCODONE-ACETAMINOPHEN 5-325 MG PO TABS: 2.0000 | ORAL_TABLET | ORAL | Status: DC | PRN

## 2012-05-19 NOTE — ED Notes (Signed)
The pt is c/o  Lt flank pain with lower back pain and leg pain since yesterday.  Diarrhea for one week.  Chills nausea

## 2012-05-19 NOTE — ED Provider Notes (Signed)
History     CSN: [id number]  Arrival date & time 05/19/12  2026   First MD Initiated Contact with Patient 05/19/12 2057      Chief Complaint  Patient presents with  . Flank Pain    (Consider location/radiation/quality/duration/timing/severity/associated sxs/prior treatment) HPI Complaints of left flank pain, lower back pain, and bilateral leg pain.  Flank pain and leg pain started yesterday and has gradually gotten worse. Back pain has been going on for several weeks. She was seen in an office visit yesterday. Xrays were reviewed she has some chronic changes. She was given prescription for vicodin and told to follow up in a week.  Pain described as sharp, moving into legs, 10/10 in intensity. The location of the patient's problem is left lower back. Modifying factors:  vicodin with minimal improvment.  Associated symptoms: no fever, no dysuria, no hematuria, no fall/trauma, no bladder/bowel incontinence, no saddle anaesthesia. .   Past Medical History  Diagnosis Date  . Hypertension   . Hyperlipidemia   . Asthma   . Obesity   . Lower extremity edema     Chronic. 2D echo (2005) - EF 65%.  . Seasonal allergies   . Abnormal EKG 06/2003    History of inverted T waves V1-V3. Normal 2D echo (07/23/2003): LVEF 65%.  . History of multiple pulmonary nodules     Incidental finding: CT Abd/ Pelvis (04/2010) - Several small lower lobe lung nodules, including one pure ground-glass pulmonary nodule measuring 8 mm in the left lower lobe.  Recommend follow-up chest CT (IV contrast preferred) in 6 months to document stability. //  CT Abd/ Pelvis (07/2010) -  3 mm RLL and  8 mm LLL nodule stable.  Other nodules unchanged, likely benign.  . 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 Followed by Sports Medicine, Dr. Jennette Kettle.  . Incarcerated ventral hernia 04/2010    Noted on CT Abd/ pelvis (04/2010). Patient now s/p ventral hernia repair by Dr. Gerrit Friends (12/2010)  . Anemia     BL Hgb  11-12. Ferritin 14 - low normal (08/2007). Colonoscopy 2009 - external hemorrhoids (excellent prep). Last anemia panel (12/2010) - Iron  24, TIBC 269,  B12  316, Folate 11.9, Ferritin 73.  . Night sweats   . Dizziness   . Fatigue     loss of sleep  . Wears dentures   . Wears glasses   . 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 Incision and drainage abscess anal 07/2008    I&D and debridgement of anorectal abscess  . Inguinal hernia repair   . Tubal ligation   . Incisional hernia repair 12/2010    Repair of incarcerated ventral incisional hernia with Ethicon mesh patch - performed by Dr. Gerrit Friends.   . Foot surgery 2004     left  . Total knee arthroplasty 06/16/2011    Left TOTAL KNEE ARTHROPLASTY;  Surgeon: Kennieth Rad;  Location: [facility] OR;  Service: Orthopedics;  Laterality: Left;  left total knee arthroplasty    Family History  Problem Relation Age of Onset  . Diabetes Mother   . Hypertension Mother   . Stroke Mother   . Heart attack Neg Hx   . Dementia Father     History  Substance Use Topics  . Smoking status: Former Smoker -- 0.5 packs/day for 20 years    Types: Cigarettes  Quit date: 09/10/1998  . Smokeless tobacco: Not on file  . Alcohol Use: No    OB History    Grav Para Term Preterm Abortions TAB SAB Ect Mult Living                  Review of Systems Negative for respiratory distress, cough. Negative for vomiting, diarrhea.  All other systems reviewed and negative unless noted in HPI.    Allergies  Ketorolac tromethamine; Atrovent; and Latex  Home Medications   Current Outpatient Rx  Name  Route  Sig  Dispense  Refill  . ALBUTEROL SULFATE HFA 108 (90 BASE) MCG/ACT IN AERS   Inhalation   Inhale 2 puffs into the lungs every 4 (four) hours as needed for wheezing or shortness of breath.   1 Inhaler   0   . FERROUS SULFATE 325 (65 FE) MG PO TABS   Oral   Take 1 tablet (325 mg  total) by mouth 2 (two) times daily with a meal.   60 tablet   3   . HYDROCODONE-ACETAMINOPHEN 5-500 MG PO TABS   Oral   Take 2 tablets by mouth every 6 (six) hours as needed for pain.   20 tablet   0   . IBUPROFEN 800 MG PO TABS   Oral   Take 1 tablet (800 mg total) by mouth 3 (three) times daily with meals. For pain   42 tablet   0   . LISINOPRIL-HYDROCHLOROTHIAZIDE 20-12.5 MG PO TABS   Oral   Take 2 tablets by mouth daily.   60 tablet   3   . LOPERAMIDE HCL 2 MG PO TABS   Oral   Take 1 tablet (2 mg total) by mouth 4 (four) times daily as needed for diarrhea or loose stools.   30 tablet   0   . METHOCARBAMOL 500 MG PO TABS   Oral   Take 1 tablet (500 mg total) by mouth 3 (three) times daily as needed.   40 tablet   0   . NYSTATIN 100000 UNIT/GM EX POWD   Topical   Apply topically 4 (four) times daily.   15 g   0   . PRAVASTATIN SODIUM 40 MG PO TABS   Oral   Take 1 tablet (40 mg total) by mouth every evening.   30 tablet   3     PLEASE CANCEL ALL REMAINING PRAVASTATIN 20MG  PRESC ...     BP 153/70  Pulse 56  Temp 98.2 F (36.8 C) (Oral)  Resp 20  SpO2 100%  LMP 09/09/2009  Physical Exam Nursing note and vitals reviewed.  Constitutional: Pt is alert and appears stated age. Obese. Oropharynx: Airway open without erythema or exudate. Respiratory: No respiratory distress. Equal breathing bilaterally. CV: Extremities warm and well perfused. No mumur appreciated.  Neuro: No motor nor sensory deficit. GCS 15. Normal coordination. Equal DTRs.  Head: Normocephalic and atraumatic. Eyes: No conjunctivitis, no scleral icterus. Neck: Supple, no mass. Chest: Non-tender. Abdomen: Soft, mild left lower quadrant tenderness.  MSK: Extremities are atraumatic without deformity. Left low back tender to palpation. Legs non-tender with no injury. FROM. Negative straight leg test.  Skin: No rash, no wounds.  ED Course  Procedures (including critical care  time)   Labs Reviewed  URINALYSIS, ROUTINE W REFLEX MICROSCOPIC  CBC WITH DIFFERENTIAL  COMPREHENSIVE METABOLIC PANEL   No results found.   No diagnosis found.    MDM  56 y.o. female here with pain  mostly in back that has gotten progressively worse in past 2 days. Seen by PCP yesterday and pt here for second opinion.  Pertinent past problems include HtN, HL, chronic lower extremity edema, asthma. Likely MSK in etiology. May have some sciatic nerve irritation component. No back pain red flags. No indication for imaging. UA without infection or blood. Abdomen soft but some LLQ pain. CBC with no leukocytosis. Doubt diverticulitis. Pt with improvement in pain after 2 rounds of IV dilaudid. Pt able to ambulate without difficulty. Plan to f/u with PCP. Counseling provided regarding diagnosis, treatment plan, follow up recommendations, and return precautions. Questions answered.   Medical Decision Making discussed with ED attending Nelia Shi, MD          Charm Barges, MD 05/19/12 [phone]

## 2012-05-20 NOTE — ED Provider Notes (Signed)
I saw and evaluated the patient, reviewed the resident's note and I agree with the findings and plan.   .Face to face Exam:  General:  Awake HEENT:  Atraumatic Resp:  Normal effort Abd:  Nondistended Neuro:No focal weakness Lymph: No adenopathy   Nelia Shi, MD 05/20/12 0041

## 2012-05-21 NOTE — Progress Notes (Signed)
agree

## 2012-06-05 ENCOUNTER — Encounter: Payer: Self-pay | Admitting: Internal Medicine

## 2012-06-05 NOTE — Progress Notes (Signed)
If patient is going to require ongoing narcotics from clinic, will ultimately need to sign a contract. As well, will need to have APAP component of the vicodin reduced to 325mg  daily.  I do not plan to continue ongoing narcotics if at all possible.    SignedJohnette Abraham, D.O.  06/05/2012, 10:14 AM

## 2012-06-11 NOTE — Addendum Note (Signed)
Addended by: Priscella Mann on: 06/11/2012 05:53 PM   Modules accepted: Orders

## 2012-07-02 ENCOUNTER — Ambulatory Visit: Payer: Self-pay | Admitting: Internal Medicine

## 2012-08-07 ENCOUNTER — Encounter (HOSPITAL_COMMUNITY): Payer: Self-pay | Admitting: Adult Health

## 2012-08-07 ENCOUNTER — Emergency Department (HOSPITAL_COMMUNITY)
Admission: EM | Admit: 2012-08-07 | Discharge: 2012-08-07 | Disposition: A | Discharge status: Home / Self Care | Payer: Self-pay | Attending: Emergency Medicine | Admitting: Emergency Medicine

## 2012-08-07 ENCOUNTER — Emergency Department (HOSPITAL_COMMUNITY): Payer: Self-pay

## 2012-08-07 DIAGNOSIS — B349 Viral infection, unspecified: Secondary | ICD-10-CM

## 2012-08-07 DIAGNOSIS — J45909 Unspecified asthma, uncomplicated: Secondary | ICD-10-CM | POA: Insufficient documentation

## 2012-08-07 DIAGNOSIS — J029 Acute pharyngitis, unspecified: Secondary | ICD-10-CM | POA: Insufficient documentation

## 2012-08-07 DIAGNOSIS — Z79899 Other long term (current) drug therapy: Secondary | ICD-10-CM | POA: Insufficient documentation

## 2012-08-07 DIAGNOSIS — E669 Obesity, unspecified: Secondary | ICD-10-CM | POA: Insufficient documentation

## 2012-08-07 DIAGNOSIS — Z87891 Personal history of nicotine dependence: Secondary | ICD-10-CM | POA: Insufficient documentation

## 2012-08-07 DIAGNOSIS — Z8709 Personal history of other diseases of the respiratory system: Secondary | ICD-10-CM | POA: Insufficient documentation

## 2012-08-07 DIAGNOSIS — Z8719 Personal history of other diseases of the digestive system: Secondary | ICD-10-CM | POA: Insufficient documentation

## 2012-08-07 DIAGNOSIS — B9789 Other viral agents as the cause of diseases classified elsewhere: Secondary | ICD-10-CM | POA: Insufficient documentation

## 2012-08-07 DIAGNOSIS — R6883 Chills (without fever): Secondary | ICD-10-CM | POA: Insufficient documentation

## 2012-08-07 DIAGNOSIS — E785 Hyperlipidemia, unspecified: Secondary | ICD-10-CM | POA: Insufficient documentation

## 2012-08-07 DIAGNOSIS — I10 Essential (primary) hypertension: Secondary | ICD-10-CM | POA: Insufficient documentation

## 2012-08-07 DIAGNOSIS — D649 Anemia, unspecified: Secondary | ICD-10-CM | POA: Insufficient documentation

## 2012-08-07 DIAGNOSIS — Z8739 Personal history of other diseases of the musculoskeletal system and connective tissue: Secondary | ICD-10-CM | POA: Insufficient documentation

## 2012-08-07 MED ORDER — ACETAMINOPHEN-CODEINE 120-12 MG/5ML PO SUSP: 5.0000 mL | Freq: Four times a day (QID) | ORAL | Status: DC | PRN

## 2012-08-07 MED ORDER — ACETAMINOPHEN-CODEINE 120-12 MG/5ML PO SOLN
5.0000 mL | Freq: Once | ORAL | Status: AC
  Administered 2012-08-07: 5 mL via ORAL
  Filled 2012-08-07: qty 10

## 2012-08-07 MED ORDER — IBUPROFEN 800 MG PO TABS: 800.0000 mg | ORAL_TABLET | Freq: Three times a day (TID) | ORAL | Status: DC

## 2012-08-07 MED ORDER — IBUPROFEN 800 MG PO TABS
800.0000 mg | ORAL_TABLET | Freq: Once | ORAL | Status: AC
  Administered 2012-08-07: 800 mg via ORAL
  Filled 2012-08-07: qty 1

## 2012-08-07 NOTE — ED Provider Notes (Signed)
History     CSN: [id number]  Arrival date & time 08/07/12  0031   First MD Initiated Contact with Patient 08/07/12 0132      Chief Complaint  Patient presents with  . Cough    (Consider location/radiation/quality/duration/timing/severity/associated sxs/prior treatment) HPI History provided the patient. Sick for the last 5 days with cough, chills, sore throat body aches. She's developed a productive cough. Moderate severity. Her daughter has the same symptoms at home. Patient worried she has the flu. No shortness of breath. No chest pain. No rash. No recent travel. Has had some mild headaches with this.  Past Medical History  Diagnosis Date  . Hypertension   . Hyperlipidemia   . Asthma   . Obesity   . Lower extremity edema     Chronic. 2D echo (2005) - EF 65%.  . Seasonal allergies   . Abnormal EKG 06/2003    History of inverted T waves V1-V3. Normal 2D echo (07/23/2003): LVEF 65%.  . History of multiple pulmonary nodules     Incidental finding: CT Abd/ Pelvis (04/2010) - Several small lower lobe lung nodules, including one pure ground-glass pulmonary nodule measuring 8 mm in the left lower lobe.  Recommend follow-up chest CT (IV contrast preferred) in 6 months to document stability. //  CT Abd/ Pelvis (07/2010) -  3 mm RLL and  8 mm LLL nodule stable.  Other nodules unchanged, likely benign.  . 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 Followed by Sports Medicine, Dr. Jennette Kettle.  . Incarcerated ventral hernia 04/2010    Noted on CT Abd/ pelvis (04/2010). Patient now s/p ventral hernia repair by Dr. Gerrit Friends (12/2010)  . Anemia     BL Hgb 11-12. Ferritin 14 - low normal (08/2007). Colonoscopy 2009 - external hemorrhoids (excellent prep). Last anemia panel (12/2010) - Iron  24, TIBC 269,  B12  316, Folate 11.9, Ferritin 73.  . Night sweats   . Dizziness   . Fatigue     loss of sleep  . Wears dentures   . Wears glasses   . Knee osteoarthritis     s/p Left total  knee replacement (06/2011)    Past Surgical History  Procedure Date  . Incision and drainage abscess anal 07/2008    I&D and debridgement of anorectal abscess  . Inguinal hernia repair   . Tubal ligation   . Incisional hernia repair 12/2010    Repair of incarcerated ventral incisional hernia with Ethicon mesh patch - performed by Dr. Gerrit Friends.   . Foot surgery 2004     left  . Total knee arthroplasty 06/16/2011    Left TOTAL KNEE ARTHROPLASTY;  Surgeon: Kennieth Rad;  Location: [facility] OR;  Service: Orthopedics;  Laterality: Left;  left total knee arthroplasty    Family History  Problem Relation Age of Onset  . Diabetes Mother   . Hypertension Mother   . Stroke Mother   . Heart attack Neg Hx   . Dementia Father     History  Substance Use Topics  . Smoking status: Former Smoker -- 0.5 packs/day for 20 years    Types: Cigarettes    Quit date: 09/10/1998  . Smokeless tobacco: Not on file  . Alcohol Use: No    OB History    Grav Para Term Preterm Abortions TAB SAB Ect Mult Living                  Review of Systems  Constitutional:  Positive for fever and chills.  HENT: Positive for sore throat.   Eyes: Negative for visual disturbance.  Respiratory: Positive for cough. Negative for shortness of breath.   Cardiovascular: Negative for chest pain.  Gastrointestinal: Negative for vomiting and abdominal pain.  Genitourinary: Negative for dysuria.  Musculoskeletal: Negative for back pain.  Skin: Negative for rash.  Neurological: Negative for syncope.  All other systems reviewed and are negative.    Allergies  Ketorolac tromethamine; Atrovent; and Latex  Home Medications   Current Outpatient Rx  Name  Route  Sig  Dispense  Refill  . ALBUTEROL SULFATE HFA 108 (90 BASE) MCG/ACT IN AERS   Inhalation   Inhale 2 puffs into the lungs every 4 (four) hours as needed for wheezing or shortness of breath.   1 Inhaler   0   . FERROUS SULFATE 325 (65 FE) MG PO TABS   Oral    Take 325 mg by mouth daily with breakfast.         . LISINOPRIL-HYDROCHLOROTHIAZIDE 20-12.5 MG PO TABS   Oral   Take 2 tablets by mouth daily.   60 tablet   3   . METHOCARBAMOL 500 MG PO TABS   Oral   Take 500 mg by mouth 3 (three) times daily as needed. For muscle pain         . PRAVASTATIN SODIUM 40 MG PO TABS   Oral   Take 40 mg by mouth daily.         . ACETAMINOPHEN-CODEINE 120-12 MG/5ML PO SUSP   Oral   Take 5 mLs by mouth every 6 (six) hours as needed for pain.   60 mL   0   . IBUPROFEN 800 MG PO TABS   Oral   Take 1 tablet (800 mg total) by mouth 3 (three) times daily.   21 tablet   0     BP 158/81  Pulse 79  Temp 98.9 F (37.2 C) (Oral)  Resp 18  SpO2 98%  LMP 09/09/2009  Physical Exam  Constitutional: She is oriented to person, place, and time. She appears well-developed and well-nourished.  HENT:  Head: Normocephalic and atraumatic.  Nose: Nose normal.  Mouth/Throat: Oropharynx is clear and moist. No oropharyngeal exudate.  Eyes: EOM are normal. Pupils are equal, round, and reactive to light.  Neck: Neck supple.       No nuchal rigidity  Cardiovascular: Normal rate, regular rhythm and intact distal pulses.   Pulmonary/Chest: Effort normal and breath sounds normal. No stridor. No respiratory distress. She has no wheezes. She has no rales. She exhibits no tenderness.       Dry cough during exam  Abdominal: Soft. Bowel sounds are normal. She exhibits no distension. There is no tenderness.  Musculoskeletal: Normal range of motion. She exhibits no edema.  Lymphadenopathy:    She has no cervical adenopathy.  Neurological: She is alert and oriented to person, place, and time.  Skin: Skin is warm and dry.    ED Course  Procedures (including critical care time)  Labs Reviewed - No data to display Dg Chest 2 View  08/07/2012  *RADIOLOGY REPORT*  Clinical Data: Cough, shortness of breath and fever.  CHEST - 2 VIEW  Comparison: 01/04/2012 and prior  chest radiographs  Findings: The cardiomediastinal silhouette is unremarkable. There is no evidence of focal airspace disease, pulmonary edema, suspicious pulmonary nodule/mass, pleural effusion, or pneumothorax. No acute bony abnormalities are identified.  IMPRESSION: No evidence of acute cardiopulmonary disease.  Original Report Authenticated By: Harmon Pier, M.D.     1. Viral infection    Motrin and cough suppressant provided  She states understanding infx precautions. Stable for discharge home and outpatient followup as needed. No indication for Tamiflu given timing.   Room air pulse ox 98%. No respiratory distress.  MDM   Influenza-like illness. Chest x-ray reviewed as above. Vital signs and nursing notes reviewed and considered.      Sunnie Nielsen, MD 08/07/12 [phone]

## 2012-08-07 NOTE — ED Notes (Addendum)
Presents with sore throat, cough, back ache, cough is productive with yellow sputum began WEdnesday last week. Reports low grade fevers and chills. C/o neck stiffness and pain began one month ago.

## 2012-10-25 ENCOUNTER — Encounter: Payer: Self-pay | Admitting: Internal Medicine

## 2012-11-02 ENCOUNTER — Ambulatory Visit (HOSPITAL_COMMUNITY)
Admission: RE | Admit: 2012-11-02 | Discharge: 2012-11-02 | Disposition: A | Discharge status: Home / Self Care | Payer: Self-pay | Source: Ambulatory Visit | Attending: Internal Medicine | Admitting: Internal Medicine

## 2012-11-02 ENCOUNTER — Ambulatory Visit (INDEPENDENT_AMBULATORY_CARE_PROVIDER_SITE_OTHER): Payer: Self-pay | Admitting: Internal Medicine

## 2012-11-02 ENCOUNTER — Encounter: Payer: Self-pay | Admitting: Internal Medicine

## 2012-11-02 VITALS — BP 169/85 | HR 63 | Temp 97.8°F | Ht 66.0 in | Wt 324.3 lb

## 2012-11-02 DIAGNOSIS — E785 Hyperlipidemia, unspecified: Secondary | ICD-10-CM

## 2012-11-02 DIAGNOSIS — R51 Headache: Secondary | ICD-10-CM | POA: Insufficient documentation

## 2012-11-02 DIAGNOSIS — R002 Palpitations: Secondary | ICD-10-CM

## 2012-11-02 DIAGNOSIS — D649 Anemia, unspecified: Secondary | ICD-10-CM

## 2012-11-02 DIAGNOSIS — I10 Essential (primary) hypertension: Secondary | ICD-10-CM

## 2012-11-02 DIAGNOSIS — R519 Headache, unspecified: Secondary | ICD-10-CM | POA: Insufficient documentation

## 2012-11-02 DIAGNOSIS — R42 Dizziness and giddiness: Secondary | ICD-10-CM

## 2012-11-02 LAB — TSH: TSH: 1.821 u[IU]/mL (ref 0.350–4.500)

## 2012-11-02 LAB — COMPREHENSIVE METABOLIC PANEL
ALT: <8 U/L (ref 0–35)
AST: 12 U/L (ref 0–37)
Albumin: 3.9 g/dL (ref 3.5–5.2)
Alkaline Phosphatase: 104 U/L (ref 39–117)
BUN: 11 mg/dL (ref 6–23)
CO2: 28 mEq/L (ref 19–32)
Calcium: 9.7 mg/dL (ref 8.4–10.5)
Chloride: 102 mEq/L (ref 96–112)
Creat: 0.77 mg/dL (ref 0.50–1.10)
Glucose, Bld: 85 mg/dL (ref 70–99)
Potassium: 4.1 mEq/L (ref 3.5–5.3)
Sodium: 139 mEq/L (ref 135–145)
Total Bilirubin: 0.4 mg/dL (ref 0.3–1.2)
Total Protein: 6.8 g/dL (ref 6.0–8.3)

## 2012-11-02 LAB — CBC
HCT: 38.4 % (ref 36.0–46.0)
Hemoglobin: 12.5 g/dL (ref 12.0–15.0)
MCH: 28.8 pg (ref 26.0–34.0)
MCHC: 32.6 g/dL (ref 30.0–36.0)
MCV: 88.5 fL (ref 78.0–100.0)
Platelets: 330 10*3/uL (ref 150–400)
RBC: 4.34 MIL/uL (ref 3.87–5.11)
RDW: 14.2 % (ref 11.5–15.5)
WBC: 4.5 10*3/uL (ref 4.0–10.5)

## 2012-11-02 LAB — LIPID PANEL
Cholesterol: 209 mg/dL — ABNORMAL HIGH (ref 0–200)
HDL: 42 mg/dL (ref >39)
LDL Cholesterol: 145 mg/dL — ABNORMAL HIGH (ref 0–99)
Total CHOL/HDL Ratio: 5 Ratio
Triglycerides: 112 mg/dL (ref <150)
VLDL: 22 mg/dL (ref 0–40)

## 2012-11-02 MED ORDER — AMLODIPINE BESYLATE 5 MG PO TABS: 5.0000 mg | ORAL_TABLET | Freq: Every day | ORAL | Status: DC

## 2012-11-02 MED ORDER — ASPIRIN 81 MG PO TABS: 81.0000 mg | ORAL_TABLET | Freq: Every day | ORAL | Status: DC

## 2012-11-02 NOTE — Progress Notes (Signed)
Case discussed with Dr. Kalia-Reynolds at the time of the visit, immediately after the resident saw the patient.  I reviewed the resident's history and exam and pertinent patient test results.  I agree with the assessment, diagnosis and plan of care documented in the resident's note.   

## 2012-11-02 NOTE — Assessment & Plan Note (Signed)
Pertinent Data Reviewed: CBC:    Component Value Date/Time   WBC 5.7 05/19/2012 2035   HGB 12.9 05/19/2012 2035   HCT 39.8 05/19/2012 2035   PLT 319 05/19/2012 2035   MCV 88.4 05/19/2012 2035   NEUTROABS 2.3 05/19/2012 2035   LYMPHSABS 2.8 05/19/2012 2035   MONOABS 0.4 05/19/2012 2035   EOSABS 0.3 05/19/2012 2035   BASOSABS 0.0 05/19/2012 2035    Lab Results  Component Value Date   IRON 61 11/17/2011   TIBC 351 11/17/2011   FERRITIN 27 11/17/2011    Assessment: Hgb historically stable recently. Has already had colonoscopy in 2009 showing external hemorrhoids only. However, with recent lightheadedness and palpitations, will need to recheck status of her anemia as well. Denies notable blood in urine or stools.  Plan:      CBC today.

## 2012-11-02 NOTE — Assessment & Plan Note (Signed)
Pertinent Data Reviewed: EKG (11/02/2012) - Normal sinus rhythm, rate approximately 62 bpm, QTc 489. Normal axis. Chronic TWI in leads V1 and V2.   Assessment: Cause unclear, possibly related to uncontrolled HTN. Otherwise, no exam / history findings consistent with BPPV, labyrinthitis. Neurologic exam completely normal except slight reduced light touch sensation on left side. Otherwise, no indication of posterior circulation deficit. As well, orthostatic vital signs were negative.  Plan:      Work towards BP control first.  EKG ordered and reviewed.  Will send for repeat echocardiogram.  She is planning to coordinate with Chauncey Reading to renew orange card. The patient was informed of the red flag symptoms that should prompt her immediate return for reevaluation - such as, but not limited to the following: gait abnormality, persistent vertigo, visual changes, focal weakness, paresthesias. She verbally expresses understanding of the information provided.

## 2012-11-02 NOTE — Assessment & Plan Note (Addendum)
Assessment: The patient indicates a one-month history of bifrontal, throbbing headaches occurring approximately 2-3 times a week. Symptoms are not consistent with migraine headache and she does not experience any photophobia, phonophobia. Possible tension headache. No exam findings consistent with acute sinusitis. I think that likely this is related to her very uncontrolled hypertension at this point. There are no new neurologic deficits noted on exam today.  Plan:      Continue PRN tylenol for now.  Work on BP control. The patient was informed of the red flag symptoms that should prompt her immediate return for reevaluation - such as, but not limited to the following: worsening headache, vision changes, chest pain, shortness of breath, new difficult with speech, focal weakness. She verbally expresses understanding of the information provided.

## 2012-11-02 NOTE — Assessment & Plan Note (Addendum)
Pertinent Labs: Liver Function Tests:    Component Value Date/Time   AST 12 05/19/2012 2035   ALT 7 05/19/2012 2035   ALKPHOS 115 05/19/2012 2035   BILITOT 0.2* 05/19/2012 2035   PROT 7.5 05/19/2012 2035   ALBUMIN 4.0 05/19/2012 2035    Lipid Panel:     Component Value Date/Time   CHOL 204* 05/10/2012 1432   TRIG 87 05/10/2012 1432   HDL 40 05/10/2012 1432   CHOLHDL 5.1 05/10/2012 1432   VLDL 17 05/10/2012 1432   LDLCALC 147* 05/10/2012 1432     Assessment: Goal LDL (per ATP guidelines): < 130 mg/dL  Disease Control: not controlled  Progress toward goals: unable to assess  Barriers to meeting goals: nonadherence to medications    Patient is unable to remember if she is taking her medication or not at this point. Historically, compliance with her statin has remained limited..   Patient is not fasting today.   Patient is noncompliant some of the time with prescribed medications.    Plan:  Continue current for now.  Will get lipid panel today  - adjust regimen as indicated.  ADDENDUM TO PLAN AFTER LABS RESULTED: 11/06/2012, 11:31 AM  Pertinent Data Reviewed: Lab Results  Component Value Date   CHOL 209* 11/02/2012   HDL 42 11/02/2012   LDLCALC 145* 11/02/2012   TRIG 112 11/02/2012   CHOLHDL 5.0 11/02/2012    Assessment: Essentially unchanged from last year - has never been compliant with statin thusfar 2/2 cost. Goal is < 130, Framingham heart risk of 5%. Patient is attempting diet/exercise, but does not think she will be able to achieve alone. Therefore, will resume her statin as well.  Plan:  Continue statin - change to lovastatin 2/2 cost.  Diet and exercise recommended.

## 2012-11-02 NOTE — Assessment & Plan Note (Signed)
Pertinent Data: BP Readings from Last 3 Encounters:  11/02/12 169/85  08/07/12 158/81  05/19/12 125/64    Basic Metabolic Panel:    Component Value Date/Time   NA 139 05/19/2012 2035   K 3.9 05/19/2012 2035   CL 104 05/19/2012 2035   CO2 24 05/19/2012 2035   BUN 10 05/19/2012 2035   CREATININE 0.75 05/19/2012 2035   CREATININE 0.75 05/17/2012 1605   GLUCOSE 104* 05/19/2012 2035   CALCIUM 10.2 05/19/2012 2035    Assessment: Disease Control: moderately elevated  Progress toward goals: unchanged  Barriers to meeting goals: no barriers identified    States she is taking her Lisinopril-HCTZ 20-12.5mg  two tablets daily without missing doses.   She is checking blood pressures at home, which are persistently elevated.   I think several of her symptoms are related to uncontrolled HTN.    Patient is compliant most of the time with prescribed medications.   Plan:  continue current medications AND will add amlodipine 5mg  daily.  Educational resources provided:    Self management tools provided:

## 2012-11-02 NOTE — Progress Notes (Signed)
Patient: [name]   MRN: [id number]  DOB: 1956/05/20  PCP: Priscella Mann, DO   Subjective:    CC: Follow-up, Medication Refill and Headache   HPI: [name] is a 57 y.o. female with a PMHx as outlined below, who presented to clinic today for the following:  1) HTN - Patient does check blood pressure regularly at home - 160/90s. Currently taking Lisinopril-HCTZ 20-12.5mg  two pills. Patient misses doses 0 x per week on average. admits to dizziness, lightheadedness. Denies chest pain, shortness of breath.  does not request refills today.   2) Lightheadedness - patient indicates about 2x per week x 1 month she is having vertigo - episodes lasting up to 15 minutes at a time. She lays down, and subsequently self resolves. No specific precipitating factors - however, frequently feels heart fluttering before sensations occur. Last eye visit was 2 years ago, stated she needed reading glasses, which she uses. Has noted increased blurriness of vision recently. No falls or passing out as a result. No weakness, paresthesias.   3) HA - Patient describes a 1 month history of stable, intermittent,  throbbing pain, bilateral in the frontal area. Headaches are occuring 2-3x/ week, with attacks lasting approximately 1-2 days at a time. Currently, the pain is rated a 6/10 in severity. Factors that precipitated current episode include: patient is aware of none. Aggravating factors: nothing that he/she knows of, alleviating factors: lying in a darkened room and Rx Meds - vicodin from a while ago. denies recent trauma, MVA, falls. Family history: no known family members with significant headaches. Admits to associated sees dark spots. Denies associated abdominal pain, ataxia, nausea, photophobia, phonophobia and vomiting.    Review of Systems: Per HPI.    Medication Sig  . albuterol (PROVENTIL HFA;VENTOLIN HFA) 108 (90 BASE) MCG/ACT inhaler Inhale 2 puffs into the lungs every 4 (four)  hours as needed for wheezing or shortness of breath.  . ferrous sulfate 325 (65 FE) MG tablet Take 325 mg by mouth daily with breakfast.  . ibuprofen (ADVIL,MOTRIN) 800 MG tablet Take 1 tablet (800 mg total) by mouth 3 (three) times daily.  [name] lisinopril-hydrochlorothiazide (ZESTORETIC) 20-12.5 MG per tablet Take 2 tablets by mouth daily.  . methocarbamol (ROBAXIN) 500 MG tablet Take 500 mg by mouth 3 (three) times daily as needed. For muscle pain  . pravastatin (PRAVACHOL) 40 MG tablet Take 40 mg by mouth daily.    Allergies  Allergen Reactions  . Ketorolac Tromethamine Shortness Of Breath and Palpitations  . Atrovent (Ipratropium) Hives and Rash  . Latex Other (See Comments)    Powdered. Confirm type of reaction with patient.    Past Medical History  Diagnosis Date  . Hypertension   . Hyperlipidemia   . Asthma   . Obesity   . Lower extremity edema     Chronic. 2D echo (2005) - EF 65%.  . Seasonal allergies   . Abnormal EKG 06/2003    History of inverted T waves V1-V3. Normal 2D echo (07/23/2003): LVEF 65%.  . History of multiple pulmonary nodules     Incidental finding: CT Abd/ Pelvis (04/2010) - Several small lower lobe lung nodules, including one pure ground-glass pulmonary nodule measuring 8 mm in the left lower lobe.  Recommend follow-up chest CT (IV contrast preferred) in 6 months to document stability. //  CT Abd/ Pelvis (07/2010) -  3 mm RLL and  8 mm LLL nodule stable.  Other nodules unchanged, likely benign.  [name]  Degenerative joint disease     BL knees (L>R), lumbar spine. Followed by Sports Medicine, Dr. Jennette Kettle.  . Incarcerated ventral hernia 04/2010    Noted on CT Abd/ pelvis (04/2010). Patient now s/p ventral hernia repair by Dr. Gerrit Friends (12/2010)  . Anemia     BL Hgb 11-12. Ferritin 14 - low normal (08/2007). Colonoscopy 2009 - external hemorrhoids (excellent prep). Last anemia panel (12/2010) - Iron  24, TIBC 269,  B12  316, Folate 11.9, Ferritin 73.  . Night sweats   .  Dizziness   . Fatigue     loss of sleep  . Wears dentures   . Wears glasses   . Knee osteoarthritis     s/p Left total knee replacement (06/2011)    Objective:    Physical Exam: Filed Vitals:   11/02/12 1117  BP: 169/85  Pulse: 63  Temp: 97.8 F (36.6 C)     General Exam:   General: Vital signs reviewed and noted. Well-developed, well-nourished, in no acute distress; alert, appropriate and cooperative throughout examination.  Head: Normocephalic, atraumatic. Dix-Hallpike - no nystagmus with head rotation, however, lightheadedness was recreated.  Eyes: No signs of anemia or jaundince.  Nose: Mucous membranes moist, not inflammed, nonerythematous.  Throat: Oropharynx nonerythematous, no exudate appreciated.   Neck: No deformities, masses, or tenderness noted. Supple, no JVD.  Lungs:  Normal respiratory effort. Clear to auscultation BL without crackles or wheezes.  Heart: RRR. S1 and S2 normal without gallop, or rubs. (+) systolic murmur.  Abdomen:  BS normoactive. Soft, Nondistended, non-tender.  No masses or organomegaly.  Extremities: No pretibial edema.  Skin: No visible rashes, scars.     Neurologic Exam:   Mental Status: Alert, oriented, thought content appropriate.  Speech fluent without evidence of aphasia. Able to follow 3 step commands without difficulty.  Cranial Nerves:   II: Visual fields grossly intact.  III/IV/VI: Extraocular movements intact.  Pupils reactive bilaterally.  V/VII: Smile symmetric. facial light touch sensation normal bilaterally.  VIII: Grossly intact.  IX/X: Normal gag.  XI: Bilateral shoulder shrug normal.  XII: Midline tongue extension normal.  Motor:  5/5 bilaterally with normal tone and bulk  Sensory:  Light touch intact throughout, bilaterally. Slightly decrease on left as compared to right upper extremity.  Cerebellar: Normal finger-to-nose, normal rapid alternating movements and normal heel-to-shin test.  Normal gait and station.     Assessment/ Plan:   The patient's case and plan of care was discussed with attending physician, Dr. Lars Mage.

## 2012-11-02 NOTE — Patient Instructions (Signed)
General Instructions:  Please follow-up at the clinic in 3-4 weeks, at which time we will reevaluate your blood pressure, arm pain - OR, please follow-up in the clinic sooner if needed.  There have been changes in your medications:  START Amlodipine for your blood pressure - read information below.  START baby aspirin 81mg  daily for your heart health   Get the echocardiogram performed as soon as you can.   If you have been started on new medication(s), and you develop symptoms concerning for allergic reaction, including, but not limited to, throat closing, tongue swelling, rash, please stop the medication immediately and call the clinic at [phone], and go to the ER.   If symptoms worsen, or new symptoms arise, please call the clinic or go to the ER.  PLEASE BRING ALL OF YOUR MEDICATIONS  IN A BAG TO YOUR NEXT APPOINTMENT   Treatment Goals:  Goals (1 Years of Data) as of 11/02/12         05/10/12 11/17/11 10/14/10     Result Component    . LDL CALC < 130  147 145 142      Progress Toward Treatment Goals:  Treatment Goal 11/02/2012  Blood pressure unchanged        Amlodipine tablets What is this medicine? AMLODIPINE (am LOE di peen) is a calcium-channel blocker. It affects the amount of calcium found in your heart and muscle cells. This relaxes your blood vessels, which can reduce the amount of work the heart has to do. This medicine is used to lower high blood pressure. It is also used to prevent chest pain. This medicine may be used for other purposes; ask your health care provider or pharmacist if you have questions. What should I tell my health care provider before I take this medicine? They need to know if you have any of these conditions: -heart problems like heart failure or aortic stenosis -liver disease -an unusual or allergic reaction to amlodipine, other medicines, foods, dyes, or preservatives -pregnant or trying to get pregnant -breast-feeding How should I use  this medicine? Take this medicine by mouth with a glass of water. Follow the directions on the prescription label. You can take the tablets with or without food. Do not change the amount of grapefruit juice you drink from day to day while taking this drug, or avoid grapefruit juice altogether. Take your medicine at regular intervals. Do not take more medicine than directed. Talk to your pediatrician regarding the use of this medicine in children. Special care may be needed. This medicine has been used in children as young as 6. Persons over 54 years old may have a stronger reaction to this medicine and need smaller doses. Overdosage: If you think you have taken too much of this medicine contact a poison control center or emergency room at once. NOTE: This medicine is only for you. Do not share this medicine with others. What if I miss a dose? If you miss a dose, take it as soon as you can. If it is almost time for your next dose, take only that dose. Do not take double or extra doses. What may interact with this medicine? -herbal or dietary supplements -local or general anesthetics -medicines for high blood pressure -medicines for prostate problems -rifampin This list may not describe all possible interactions. Give your health care provider a list of all the medicines, herbs, non-prescription drugs, or dietary supplements you use. Also tell them if you smoke, drink alcohol, or use illegal  drugs. Some items may interact with your medicine. What should I watch for while using this medicine? Visit your doctor or health care professional for regular check ups. Check your blood pressure and pulse rate regularly. Ask your health care professional what your blood pressure and pulse rate should be, and when you should contact him or her. This medicine may make you feel confused, dizzy or lightheaded. Do not drive, use machinery, or do anything that needs mental alertness until you know how this medicine  affects you. To reduce the risk of dizzy or fainting spells, do not sit or stand up quickly, especially if you are an older patient. Avoid alcoholic drinks; they can make you more dizzy. Do not suddenly stop taking amlodipine. Ask your doctor or health care professional how you can gradually reduce the dose. What side effects may I notice from receiving this medicine? Side effects that you should report to your doctor or health care professional as soon as possible: -allergic reactions like skin rash, itching or hives, swelling of the face, lips, or tongue -breathing problems -changes in vision or hearing -chest pain -fast, irregular heartbeat -swelling of legs or ankles Side effects that usually do not require medical attention (report to your doctor or health care professional if they continue or are bothersome): -dry mouth -facial flushing -nausea, vomiting -stomach gas, pain -tired, weak -trouble sleeping This list may not describe all possible side effects. Call your doctor for medical advice about side effects. You may report side effects to FDA at 1-800-FDA-1088. Where should I keep my medicine? Keep out of the reach of children. Store at room temperature between 59 and 86 degrees F (15 and 30 degrees C). Protect from light. Keep container tightly closed. Throw away any unused medicine after the expiration date. NOTE: This sheet is a summary. It may not cover all possible information. If you have questions about this medicine, talk to your doctor, pharmacist, or health care provider.  2012, [employer]. (09/07/2007 4:01:50 PM)

## 2012-11-06 MED ORDER — LOVASTATIN 20 MG PO TABS: 20.0000 mg | ORAL_TABLET | Freq: Every day | ORAL | Status: DC

## 2012-11-06 NOTE — Addendum Note (Signed)
Addended by: Priscella Mann on: 11/06/2012 11:43 AM   Modules accepted: Orders, Medications

## 2012-11-06 NOTE — Progress Notes (Signed)
Quick Note:  Has not been taking her statin. Will change to lovastatin for cost issues. ______

## 2013-02-26 ENCOUNTER — Ambulatory Visit: Payer: Self-pay

## 2013-03-06 ENCOUNTER — Telehealth: Payer: Self-pay | Admitting: *Deleted

## 2013-03-06 ENCOUNTER — Ambulatory Visit (INDEPENDENT_AMBULATORY_CARE_PROVIDER_SITE_OTHER): Payer: No Typology Code available for payment source | Admitting: Internal Medicine

## 2013-03-06 VITALS — BP 152/89 | HR 87 | Temp 98.5°F | Ht 66.0 in | Wt 326.8 lb

## 2013-03-06 DIAGNOSIS — I10 Essential (primary) hypertension: Secondary | ICD-10-CM

## 2013-03-06 DIAGNOSIS — M549 Dorsalgia, unspecified: Secondary | ICD-10-CM

## 2013-03-06 DIAGNOSIS — E785 Hyperlipidemia, unspecified: Secondary | ICD-10-CM

## 2013-03-06 DIAGNOSIS — N390 Urinary tract infection, site not specified: Secondary | ICD-10-CM | POA: Insufficient documentation

## 2013-03-06 MED ORDER — AMLODIPINE BESYLATE 5 MG PO TABS: 5.0000 mg | ORAL_TABLET | Freq: Every day | ORAL | Status: DC

## 2013-03-06 MED ORDER — SULFAMETHOXAZOLE-TMP DS 800-160 MG PO TABS: 1.0000 | ORAL_TABLET | Freq: Two times a day (BID) | ORAL | Status: DC

## 2013-03-06 MED ORDER — IBUPROFEN 800 MG PO TABS: 800.0000 mg | ORAL_TABLET | Freq: Three times a day (TID) | ORAL | Status: DC

## 2013-03-06 MED ORDER — LISINOPRIL-HYDROCHLOROTHIAZIDE 20-12.5 MG PO TABS: 2.0000 | ORAL_TABLET | Freq: Every day | ORAL | Status: DC

## 2013-03-06 MED ORDER — LOVASTATIN 20 MG PO TABS: 20.0000 mg | ORAL_TABLET | Freq: Every day | ORAL | Status: DC

## 2013-03-06 NOTE — Progress Notes (Signed)
Subjective:     Patient ID: [name], female   DOB: 10-08-1955, 57 y.o.   MRN: [id number]  HPI: Patient experiences pain in mid back that is constant for the past two weeks that radiate superiorly, but does not radiate towards her anterior abdomen. Patient reports severe pressure and pain with urination. Pain with urination is 9/10. She also reports noticing bleeding on toilet paper starting yesterday. Her bleeding has continued to increase up until today where it is bleeding similar to her menstrual cycle. She reports her abdomen has been tender for a few weeks and she feels that it is harder than usual in the epigastric region. She denies discharge or malodor from her vagina.  Last menstrual cycle was 5 years ago. She reports and occasional sharp pain in her vagina 2-3X/month for the past 6 months. She reports her last pap smear in 2013 with unremarkable findings. She had a hernia repair of abdominal hernia with surgical mesh repair without problems since the time of her surgery. She denies ever having kidney stones in the past. She is unaware of anyone in her family with kidney stones in the past. She has not experienced a UTI for many years. She reports to be sexually with last intercourse being Sunday with her husband and no other individuals. They do not use any form of birth control as she had tubal ligation in the past. Patient reports low grade temperature. She denies chills, N/V, constipation/diarrhea and soreness otherwise.       Review of Systems  Constitutional: Positive for fever. Negative for chills, diaphoresis, activity change, appetite change, fatigue and unexpected weight change.       Mild, low-grade fever   HENT: Negative.   Eyes: Negative.   Respiratory: Positive for shortness of breath and wheezing.        Associated with her asthma.   Gastrointestinal: Positive for abdominal pain. Negative for nausea, vomiting, diarrhea, constipation, blood in stool, abdominal distention  and anal bleeding.  Genitourinary: Positive for frequency, hematuria, difficulty urinating and vaginal pain.  Musculoskeletal: Positive for back pain.  Skin: Negative.   Allergic/Immunologic: Negative.   Neurological: Negative.   Hematological: Negative.   Psychiatric/Behavioral: Negative.        Objective:   Physical Exam  Constitutional: She is oriented to person, place, and time. She appears well-developed and well-nourished. No distress.  HENT:  Head: Normocephalic and atraumatic.  Eyes: Conjunctivae and EOM are normal. Pupils are equal, round, and reactive to light.  Neck: Normal range of motion. Neck supple.  Cardiovascular: Normal rate and regular rhythm.   Murmur heard. Pulmonary/Chest: Effort normal and breath sounds normal.  Abdominal: Soft. Bowel sounds are normal. There is tenderness. There is guarding. There is no rebound.  Genitourinary:  Cervical exam is unremarkable. No blood is appreciated in the vaginal canal.    Musculoskeletal: Normal range of motion.  Neurological: She is alert and oriented to person, place, and time.  Skin: She is not diaphoretic.       Assessment:     Patient's signs and symptoms are consistent with UTI considering erythrocytes and leukocytes present on dip stick. However, there is no evidence of nitrites present in her urine. Differential diagnosis includes nephrolithiasis considering her back pain, but this unlikely as the pain does not radiate and her vaginal symptoms developed first. Plan to prescribe Bactrim DS 7 day course. Extended course prescribed incase of pyelonephritis. Patient advised to call if symptoms worsened or she developed systemic infection symptoms.  Plant to send urinalysis. Will follow up on results.  Back pain- Refilled patient's prescription for Ibuprofen 800mg  to take as needed for back pain.   Continuity of care- Refilled patient medications including amlodipine, albuterol, ibuprofen and lovastatin.

## 2013-03-06 NOTE — Progress Notes (Signed)
  Subjective:    Patient ID: [name], female    DOB: 1956/01/17, 57 y.o.   MRN: [id number]  HPI  [name] is a 57 year old woman with a history of hypertension, hyperlipidemia, and osteoarthritis who presents with a two-week history of dysuria, right-sided back pain, and blood on the toilet tissue paper after urinating. She denies any fevers, chills, back trauma, or pelvic trauma. She has occasional shakes. Her last urinary tract infection was several years ago and had similar symptoms except for the bleeding. Her last menstrual period was 5 years ago and she has been sexually active with her husband. Because the dysuria and back discomfort did not resolve she called the clinic and was given an appointment for further evaluation. Her only other complaint is that she needs refills on several of her medications.  Review of Systems  Constitutional: Negative for fever, chills and diaphoresis.  Respiratory: Positive for chest tightness and wheezing. Negative for shortness of breath.   Cardiovascular: Negative for leg swelling.  Gastrointestinal: Negative for abdominal pain, blood in stool and abdominal distention.  Genitourinary: Positive for dysuria, urgency, frequency, hematuria, decreased urine volume, difficulty urinating and pelvic pain. Negative for genital sores, vaginal pain, menstrual problem and dyspareunia.  Musculoskeletal: Positive for back pain.  Hematological: Does not bruise/bleed easily.      Objective:   Physical Exam  Nursing note and vitals reviewed. Constitutional: She is oriented to person, place, and time. She appears well-developed and well-nourished. No distress.  HENT:  Head: Normocephalic and atraumatic.  Eyes: Conjunctivae are normal. Right eye exhibits no discharge. Left eye exhibits no discharge. No scleral icterus.  Neck: Normal range of motion. Neck supple.  Cardiovascular: Normal rate and regular rhythm.  Exam reveals no gallop and no friction rub.    Murmur heard. Pulmonary/Chest: Effort normal and breath sounds normal. No respiratory distress. She has no wheezes. She has no rales.  Abdominal: Soft. She exhibits no distension. There is tenderness. There is no rebound and no guarding.  Genitourinary: Vagina normal and uterus normal.  No blood within vagina.  No vaginal wall lesions.  Musculoskeletal: Normal range of motion. She exhibits no edema and no tenderness.  Neurological: She is alert and oriented to person, place, and time. She exhibits normal muscle tone.  Skin: Skin is warm and dry. She is not diaphoretic. No erythema.  Psychiatric: She has a normal mood and affect. Her behavior is normal. Judgment and thought content normal.      Assessment & Plan:   Please see problem oriented charting.

## 2013-03-06 NOTE — Telephone Encounter (Signed)
Pt calls and states for 2 days she has pressure on her bladder, freq and small amts. She also notes that she has pinkish smears on toilet tissue w/ voiding. Would like to be seen, scheduled w/ medical student and dr Josem Kaufmann today at [phone]

## 2013-03-06 NOTE — Patient Instructions (Signed)
It was a pleasure to meet you today! - We have prescribed a 7 day course of an antibiotic called Bactrim. Take this antibiotics twice daily until complete.  - We have refilled your medications including your Ibuprofen. Take this medication as prescribed for back pain. - We will contact you if any of your lab findings are concerning. - If your symptoms do not improve in the next week, please call our clinic to be seen. - If you develop fever, nausea or vomiting, or increased bleeding please contact our clinic.

## 2013-03-06 NOTE — Assessment & Plan Note (Signed)
Her constellation of symptoms is most consistent with a urinary tract infection and possible early right-sided pyelonephritis without significant systemic symptoms. This is likely an uncomplicated urinary tract infection as she has not had one for many years and has no other risk factors. Because of the bleeding site being unclear we performed a speculum exam which failed to reveal any blood within the vagina and therefore we feel confident the blood on the toilet tissue is likely urinary in nature. Visual inspection of the pelvic area was without lesions and a bimanual exam was unremarkable. We therefore decided to treat the urinary tract infection with Bactrim double strength one tablet by mouth twice a day for 7 days. She was instructed to call the clinic if she did not have any improvement in her symptoms over the next several days. The associated back pain was treated with ibuprofen 800 mg by mouth every 8 hours as needed for pain to be taken with food. This medication was effective for her pain in the past and therefore has a high likelihood of being efficacious in this situation as well.

## 2013-03-06 NOTE — Assessment & Plan Note (Signed)
Her blood pressure was slightly elevated today likely related to the fact that she had been without any of her antihypertensive medications. We therefore renewed the amlodipine 5 mg by mouth daily and lisinopril-hydrochlorothiazide 40-25 mg by mouth daily. At the followup visit the efficacy of this therapy can be reassessed.

## 2013-03-06 NOTE — Assessment & Plan Note (Signed)
She has been without her lovastatin. We there for renewed it at 20 mg by mouth daily. A followup lipid panel can be reassessed at her return visit to assure she is at target.

## 2013-03-07 LAB — URINALYSIS, ROUTINE W REFLEX MICROSCOPIC
Glucose, UA: NEGATIVE mg/dL
Nitrite: NEGATIVE
Protein, ur: >300 mg/dL — AB
Specific Gravity, Urine: 1.029 (ref 1.005–1.030)
Urobilinogen, UA: 0.2 mg/dL (ref 0.0–1.0)
pH: 5.5 (ref 5.0–8.0)

## 2013-03-07 LAB — URINALYSIS, MICROSCOPIC ONLY
Bacteria, UA: NONE SEEN
Casts: NONE SEEN
Crystals: NONE SEEN
Squamous Epithelial / LPF: NONE SEEN
WBC, UA: >50 WBC/hpf — AB (ref <3)

## 2013-03-14 ENCOUNTER — Encounter (HOSPITAL_COMMUNITY): Payer: Self-pay | Admitting: Emergency Medicine

## 2013-03-14 ENCOUNTER — Emergency Department (HOSPITAL_COMMUNITY)
Admission: EM | Admit: 2013-03-14 | Discharge: 2013-03-15 | Disposition: A | Discharge status: Home / Self Care | Payer: No Typology Code available for payment source | Attending: Emergency Medicine | Admitting: Emergency Medicine

## 2013-03-14 DIAGNOSIS — Z79899 Other long term (current) drug therapy: Secondary | ICD-10-CM | POA: Insufficient documentation

## 2013-03-14 DIAGNOSIS — Z91040 Latex allergy status: Secondary | ICD-10-CM | POA: Insufficient documentation

## 2013-03-14 DIAGNOSIS — Z98811 Dental restoration status: Secondary | ICD-10-CM | POA: Insufficient documentation

## 2013-03-14 DIAGNOSIS — Z8719 Personal history of other diseases of the digestive system: Secondary | ICD-10-CM | POA: Insufficient documentation

## 2013-03-14 DIAGNOSIS — M1710 Unilateral primary osteoarthritis, unspecified knee: Secondary | ICD-10-CM | POA: Insufficient documentation

## 2013-03-14 DIAGNOSIS — R110 Nausea: Secondary | ICD-10-CM | POA: Insufficient documentation

## 2013-03-14 DIAGNOSIS — E785 Hyperlipidemia, unspecified: Secondary | ICD-10-CM | POA: Insufficient documentation

## 2013-03-14 DIAGNOSIS — R109 Unspecified abdominal pain: Secondary | ICD-10-CM | POA: Insufficient documentation

## 2013-03-14 DIAGNOSIS — Z9851 Tubal ligation status: Secondary | ICD-10-CM | POA: Insufficient documentation

## 2013-03-14 DIAGNOSIS — R1032 Left lower quadrant pain: Secondary | ICD-10-CM | POA: Insufficient documentation

## 2013-03-14 DIAGNOSIS — I10 Essential (primary) hypertension: Secondary | ICD-10-CM | POA: Insufficient documentation

## 2013-03-14 DIAGNOSIS — Z87891 Personal history of nicotine dependence: Secondary | ICD-10-CM | POA: Insufficient documentation

## 2013-03-14 DIAGNOSIS — IMO0002 Reserved for concepts with insufficient information to code with codable children: Secondary | ICD-10-CM | POA: Insufficient documentation

## 2013-03-14 DIAGNOSIS — Z8739 Personal history of other diseases of the musculoskeletal system and connective tissue: Secondary | ICD-10-CM | POA: Insufficient documentation

## 2013-03-14 DIAGNOSIS — Z87440 Personal history of urinary (tract) infections: Secondary | ICD-10-CM | POA: Insufficient documentation

## 2013-03-14 DIAGNOSIS — D649 Anemia, unspecified: Secondary | ICD-10-CM | POA: Insufficient documentation

## 2013-03-14 DIAGNOSIS — E669 Obesity, unspecified: Secondary | ICD-10-CM | POA: Insufficient documentation

## 2013-03-14 DIAGNOSIS — Z7982 Long term (current) use of aspirin: Secondary | ICD-10-CM | POA: Insufficient documentation

## 2013-03-14 DIAGNOSIS — Z789 Other specified health status: Secondary | ICD-10-CM | POA: Insufficient documentation

## 2013-03-14 DIAGNOSIS — J45909 Unspecified asthma, uncomplicated: Secondary | ICD-10-CM | POA: Insufficient documentation

## 2013-03-14 LAB — COMPREHENSIVE METABOLIC PANEL WITH GFR
ALT: 8 U/L (ref 0–35)
AST: 14 U/L (ref 0–37)
Albumin: 3.8 g/dL (ref 3.5–5.2)
Alkaline Phosphatase: 127 U/L — ABNORMAL HIGH (ref 39–117)
BUN: 15 mg/dL (ref 6–23)
CO2: 26 meq/L (ref 19–32)
Calcium: 9.4 mg/dL (ref 8.4–10.5)
Chloride: 98 meq/L (ref 96–112)
Creatinine, Ser: 1.25 mg/dL — ABNORMAL HIGH (ref 0.50–1.10)
GFR calc Af Amer: 55 mL/min — ABNORMAL LOW
GFR calc non Af Amer: 48 mL/min — ABNORMAL LOW
Glucose, Bld: 98 mg/dL (ref 70–99)
Potassium: 3.4 meq/L — ABNORMAL LOW (ref 3.5–5.1)
Sodium: 134 meq/L — ABNORMAL LOW (ref 135–145)
Total Bilirubin: 0.2 mg/dL — ABNORMAL LOW (ref 0.3–1.2)
Total Protein: 7.4 g/dL (ref 6.0–8.3)

## 2013-03-14 LAB — URINALYSIS, ROUTINE W REFLEX MICROSCOPIC
Bilirubin Urine: NEGATIVE
Glucose, UA: NEGATIVE mg/dL
Hgb urine dipstick: NEGATIVE
Ketones, ur: NEGATIVE mg/dL
Leukocytes, UA: NEGATIVE
Nitrite: NEGATIVE
Protein, ur: NEGATIVE mg/dL
Specific Gravity, Urine: 1.021 (ref 1.005–1.030)
Urobilinogen, UA: 1 mg/dL (ref 0.0–1.0)
pH: 5.5 (ref 5.0–8.0)

## 2013-03-14 LAB — CBC
HCT: 38.9 % (ref 36.0–46.0)
Hemoglobin: 12.7 g/dL (ref 12.0–15.0)
MCH: 28.6 pg (ref 26.0–34.0)
MCHC: 32.6 g/dL (ref 30.0–36.0)
MCV: 87.6 fL (ref 78.0–100.0)
Platelets: 283 K/uL (ref 150–400)
RBC: 4.44 MIL/uL (ref 3.87–5.11)
RDW: 13.8 % (ref 11.5–15.5)
WBC: 3.1 K/uL — ABNORMAL LOW (ref 4.0–10.5)

## 2013-03-14 MED ORDER — SODIUM CHLORIDE 0.9 % IV BOLUS (SEPSIS)
1000.0000 mL | Freq: Once | INTRAVENOUS | Status: AC
  Administered 2013-03-15: 1000 mL via INTRAVENOUS

## 2013-03-14 MED ORDER — MORPHINE SULFATE 4 MG/ML IJ SOLN
4.0000 mg | Freq: Once | INTRAMUSCULAR | Status: AC
  Administered 2013-03-15: 4 mg via INTRAVENOUS
  Filled 2013-03-14: qty 1

## 2013-03-14 MED ORDER — ONDANSETRON HCL 4 MG/2ML IJ SOLN
4.0000 mg | INTRAMUSCULAR | Status: AC
  Administered 2013-03-15: 4 mg via INTRAVENOUS
  Filled 2013-03-14: qty 2

## 2013-03-14 NOTE — ED Provider Notes (Signed)
CSN: [id number]     Arrival date & time 03/14/13  2010 History   First MD Initiated Contact with Patient 03/14/13 2240     Chief Complaint  Patient presents with  . Pelvic Pain   (Consider location/radiation/quality/duration/timing/severity/associated sxs/prior Treatment) HPI Comments: Patient is a 57 y/o female with a hx of HTN, R inguinal hernia repair, and tubal ligation who presents for LLQ and L suprapubic pain with onset yesterday. Patient states that pain is constant and waxing and waning in severity; worse when lying supine and without alleviating factors. Pain radiates to her back and groin. Patient endorses associated nausea. She denies associated fever, SOB, emesis, diarrhea, melena, hematochezia, dysuria, hematuria, vaginal d/c, and numbness/tinging. Patient states she was found to have hematuria and a UTI one week ago; symptoms tx with Bactrim and resolved. LMP - 3 years ago. Denies hx of kidney stones.  Patient is a 57 y.o. female presenting with pelvic pain. The history is provided by the patient. No language interpreter was used.  Pelvic Pain Associated symptoms include abdominal pain (LLQ and L suprapubic) and nausea. Pertinent negatives include no fever, numbness, vomiting or weakness.    Past Medical History  Diagnosis Date  . Hypertension   . Hyperlipidemia   . Asthma   . Obesity   . Lower extremity edema     Chronic. 2D echo (2005) - EF 65%.  . Seasonal allergies   . Abnormal EKG 06/2003    History of inverted T waves V1-V3. Normal 2D echo (07/23/2003): LVEF 65%.  . History of multiple pulmonary nodules     Incidental finding: CT Abd/ Pelvis (04/2010) - Several small lower lobe lung nodules, including one pure ground-glass pulmonary nodule measuring 8 mm in the left lower lobe.  Recommend follow-up chest CT (IV contrast preferred) in 6 months to document stability. //  CT Abd/ Pelvis (07/2010) -  3 mm RLL and  8 mm LLL nodule stable.  Other nodules unchanged, likely  benign.  . 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 Followed by Sports Medicine, Dr. Jennette Kettle.  . Incarcerated ventral hernia 04/2010    Noted on CT Abd/ pelvis (04/2010). Patient now s/p ventral hernia repair by Dr. Gerrit Friends (12/2010)  . Anemia     BL Hgb 11-12. Ferritin 14 - low normal (08/2007). Colonoscopy 2009 - external hemorrhoids (excellent prep). Last anemia panel (12/2010) - Iron  24, TIBC 269,  B12  316, Folate 11.9, Ferritin 73.  . Wears dentures   . Wears glasses   . Knee osteoarthritis     s/p Left total knee replacement (06/2011)   Past Surgical History  Procedure Laterality Date  . Incision and drainage abscess anal  07/2008    I&D and debridgement of anorectal abscess  . Inguinal hernia repair    . Tubal ligation    . Incisional hernia repair  12/2010    Repair of incarcerated ventral incisional hernia with Ethicon mesh patch - performed by Dr. Gerrit Friends.   . Foot surgery  2004     left  . Total knee arthroplasty  06/16/2011    Left TOTAL KNEE ARTHROPLASTY;  Surgeon: Kennieth Rad;  Location: [facility] OR;  Service: Orthopedics;  Laterality: Left;  left total knee arthroplasty   Family History  Problem Relation Age of Onset  . Diabetes Mother   . Hypertension Mother   . Stroke Mother   . Heart attack Neg Hx   . Dementia Father  History  Substance Use Topics  . Smoking status: Former Smoker -- 0.50 packs/day for 20 years    Types: Cigarettes    Quit date: 09/10/1998  . Smokeless tobacco: Not on file  . Alcohol Use: No   OB History   Grav Para Term Preterm Abortions TAB SAB Ect Mult Living                 Review of Systems  Constitutional: Negative for fever.  Gastrointestinal: Positive for nausea and abdominal pain (LLQ and L suprapubic). Negative for vomiting.  Genitourinary: Positive for pelvic pain. Negative for dysuria.  Neurological: Negative for weakness and numbness.  All other systems reviewed and are negative.   Allergies   Ketorolac tromethamine; Atrovent; and Latex  Home Medications   Current Outpatient Rx  Name  Route  Sig  Dispense  Refill  . albuterol (PROVENTIL HFA;VENTOLIN HFA) 108 (90 BASE) MCG/ACT inhaler   Inhalation   Inhale 2 puffs into the lungs every 4 (four) hours as needed for wheezing or shortness of breath.   1 Inhaler   0   . ferrous sulfate 325 (65 FE) MG tablet   Oral   Take 325 mg by mouth daily with breakfast.         . ibuprofen (ADVIL,MOTRIN) 800 MG tablet   Oral   Take 1 tablet (800 mg total) by mouth 3 (three) times daily.   90 tablet   11   . lisinopril-hydrochlorothiazide (ZESTORETIC) 20-12.5 MG per tablet   Oral   Take 2 tablets by mouth daily.   60 tablet   11   . lovastatin (MEVACOR) 20 MG tablet   Oral   Take 1 tablet (20 mg total) by mouth daily.   30 tablet   11   . methocarbamol (ROBAXIN) 500 MG tablet   Oral   Take 500 mg by mouth 3 (three) times daily as needed. For muscle pain         . amLODipine (NORVASC) 5 MG tablet   Oral   Take 1 tablet (5 mg total) by mouth daily.   30 tablet   11   . aspirin 81 MG tablet   Oral   Take 1 tablet (81 mg total) by mouth daily.   30 tablet      . EXPIRED: ibuprofen (ADVIL,MOTRIN) 800 MG tablet   Oral   Take 1 tablet (800 mg total) by mouth every 8 (eight) hours as needed for pain.   90 tablet   2   . sulfamethoxazole-trimethoprim (BACTRIM DS) 800-160 MG per tablet   Oral   Take 1 tablet by mouth 2 (two) times daily.   14 tablet   0    BP 115/46  Pulse 88  Temp(Src) 99.5 F (37.5 C) (Oral)  Resp 20  Ht 5\' 5"  (1.651 m)  Wt 320 lb (145.151 kg)  BMI 53.25 kg/m2  SpO2 98%  LMP 09/09/2009  Physical Exam  Nursing note and vitals reviewed. Constitutional: She is oriented to person, place, and time. She appears well-developed and well-nourished. No distress.  HENT:  Head: Normocephalic and atraumatic.  Mouth/Throat: Oropharynx is clear and moist. No oropharyngeal exudate.  Eyes:  Conjunctivae and EOM are normal. Pupils are equal, round, and reactive to light. No scleral icterus.  Neck: Normal range of motion.  Cardiovascular: Normal rate, regular rhythm and normal heart sounds.   Pulmonary/Chest: Effort normal and breath sounds normal. No respiratory distress. She has no wheezes. She has no rales.  Abdominal: Soft. Bowel sounds are normal. She exhibits no mass. There is tenderness (L suprapubic and LLQ). There is no rebound and no guarding.  Genitourinary: Vagina normal. There is no rash, tenderness, lesion or injury on the right labia. There is no rash, tenderness, lesion or injury on the left labia. Uterus is tender (mild). Cervix exhibits no motion tenderness, no discharge and no friability. Right adnexum displays no tenderness. Left adnexum displays tenderness.  Musculoskeletal: Normal range of motion.  Neurological: She is alert and oriented to person, place, and time.  Skin: Skin is warm and dry. No rash noted. She is not diaphoretic. No erythema. No pallor.  Psychiatric: She has a normal mood and affect. Her behavior is normal.   ED Course  Procedures (including critical care time) Labs Review Labs Reviewed  WET PREP, GENITAL - Abnormal; Notable for the following:    WBC, Wet Prep HPF POC RARE (*)    All other components within normal limits  COMPREHENSIVE METABOLIC PANEL - Abnormal; Notable for the following:    Sodium 134 (*)    Potassium 3.4 (*)    Creatinine, Ser 1.25 (*)    Alkaline Phosphatase 127 (*)    Total Bilirubin 0.2 (*)    GFR calc non Af Amer 48 (*)    GFR calc Af Amer 55 (*)    All other components within normal limits  CBC - Abnormal; Notable for the following:    WBC 3.1 (*)    All other components within normal limits  GC/CHLAMYDIA PROBE AMP  URINALYSIS, ROUTINE W REFLEX MICROSCOPIC   Imaging Review No results found.  MDM  No diagnosis found.  Patient pain with onset yesterday. Patient previously treated for UTI for similar  symptoms. Patient is afebrile today and hemodynamically stable. Physical exam findings as above; significant for left adnexal tenderness and tenderness to palpation of the left lower quadrant without peritoneal signs or guarding. Urine today without evidence of urinary tract infection; negative leukocytes and negative nitrites. Creatinine mildly elevated, but labs otherwise consistent with baseline. Pelvic ultrasound ordered to evaluate for cause of patient's pain and to rule out ovarian torsion given left adnexal tenderness. IV morphine and Zofran ordered for symptoms as well as IV fluids. Patient signed out to Roxy Horseman, PA-C at shift change with imaging pending for further evaluation and dispo.    Antony Madura, PA-C 03/15/13 [phone]

## 2013-03-14 NOTE — ED Notes (Signed)
Pt stated that she was treated for a UTI with Bactrim, has finished all of ABX but still has pelvic pain. Pt stated pain radiates to back and groin

## 2013-03-15 ENCOUNTER — Emergency Department (HOSPITAL_COMMUNITY): Payer: No Typology Code available for payment source

## 2013-03-15 ENCOUNTER — Encounter (HOSPITAL_COMMUNITY): Payer: Self-pay | Admitting: Radiology

## 2013-03-15 LAB — WET PREP, GENITAL
Clue Cells Wet Prep HPF POC: NONE SEEN
Trich, Wet Prep: NONE SEEN
Yeast Wet Prep HPF POC: NONE SEEN

## 2013-03-15 LAB — GC/CHLAMYDIA PROBE AMP
CT Probe RNA: NEGATIVE
GC Probe RNA: NEGATIVE

## 2013-03-15 MED ORDER — HYDROMORPHONE HCL PF 1 MG/ML IJ SOLN
1.0000 mg | Freq: Once | INTRAMUSCULAR | Status: AC
Start: 2013-03-15 — End: 2013-03-15
  Administered 2013-03-15: 1 mg via INTRAVENOUS
  Filled 2013-03-15: qty 1

## 2013-03-15 MED ORDER — MORPHINE SULFATE 4 MG/ML IJ SOLN
4.0000 mg | Freq: Once | INTRAMUSCULAR | Status: AC
  Administered 2013-03-15: 4 mg via INTRAVENOUS
  Filled 2013-03-15: qty 1

## 2013-03-15 MED ORDER — OXYCODONE-ACETAMINOPHEN 5-325 MG PO TABS: 2.0000 | ORAL_TABLET | Freq: Four times a day (QID) | ORAL | Status: DC | PRN

## 2013-03-15 MED ORDER — METHOCARBAMOL 500 MG PO TABS: 500.0000 mg | ORAL_TABLET | Freq: Two times a day (BID) | ORAL | Status: DC

## 2013-03-15 MED ORDER — IOHEXOL 300 MG/ML  SOLN
125.0000 mL | Freq: Once | INTRAMUSCULAR | Status: AC | PRN
  Administered 2013-03-15: 125 mL via INTRAVENOUS

## 2013-03-15 MED ORDER — IOHEXOL 300 MG/ML  SOLN
50.0000 mL | Freq: Once | INTRAMUSCULAR | Status: AC | PRN
  Administered 2013-03-15: 50 mL via ORAL

## 2013-03-15 NOTE — ED Provider Notes (Signed)
Patient signed out me by Raliegh Ip.  Plan at sign out is to follow up on [place] and order CT if negative.  Results for orders placed during the hospital encounter of 03/14/13  WET PREP, GENITAL      Result Value Range   Yeast Wet Prep HPF POC NONE SEEN  NONE SEEN   Trich, Wet Prep NONE SEEN  NONE SEEN   Clue Cells Wet Prep HPF POC NONE SEEN  NONE SEEN   WBC, Wet Prep HPF POC RARE (*) NONE SEEN  URINALYSIS, ROUTINE W REFLEX MICROSCOPIC      Result Value Range   Color, Urine YELLOW  YELLOW   APPearance CLEAR  CLEAR   Specific Gravity, Urine 1.021  1.005 - 1.030   pH 5.5  5.0 - 8.0   Glucose, UA NEGATIVE  NEGATIVE mg/dL   Hgb urine dipstick NEGATIVE  NEGATIVE   Bilirubin Urine NEGATIVE  NEGATIVE   Ketones, ur NEGATIVE  NEGATIVE mg/dL   Protein, ur NEGATIVE  NEGATIVE mg/dL   Urobilinogen, UA 1.0  0.0 - 1.0 mg/dL   Nitrite NEGATIVE  NEGATIVE   Leukocytes, UA NEGATIVE  NEGATIVE  COMPREHENSIVE METABOLIC PANEL      Result Value Range   Sodium 134 (*) 135 - 145 mEq/L   Potassium 3.4 (*) 3.5 - 5.1 mEq/L   Chloride 98  96 - 112 mEq/L   CO2 26  19 - 32 mEq/L   Glucose, Bld 98  70 - 99 mg/dL   BUN 15  6 - 23 mg/dL   Creatinine, Ser 5.78 (*) 0.50 - 1.10 mg/dL   Calcium 9.4  8.4 - 46.9 mg/dL   Total Protein 7.4  6.0 - 8.3 g/dL   Albumin 3.8  3.5 - 5.2 g/dL   AST 14  0 - 37 U/L   ALT 8  0 - 35 U/L   Alkaline Phosphatase 127 (*) 39 - 117 U/L   Total Bilirubin 0.2 (*) 0.3 - 1.2 mg/dL   GFR calc non Af Amer 48 (*) >90 mL/min   GFR calc Af Amer 55 (*) >90 mL/min  CBC      Result Value Range   WBC 3.1 (*) 4.0 - 10.5 K/uL   RBC 4.44  3.87 - 5.11 MIL/uL   Hemoglobin 12.7  12.0 - 15.0 g/dL   HCT 62.9  52.8 - 41.3 %   MCV 87.6  78.0 - 100.0 fL   MCH 28.6  26.0 - 34.0 pg   MCHC 32.6  30.0 - 36.0 g/dL   RDW 24.4  01.0 - 27.2 %   Platelets 283  150 - 400 K/uL   US Transvaginal Non-ob  03/15/2013   *RADIOLOGY REPORT*  Clinical Data: Pelvic pain.  Evaluate for torsion.  TRANSABDOMINAL AND  TRANSVAGINAL ULTRASOUND OF PELVIS Technique:  Both transabdominal and transvaginal ultrasound examinations of the pelvis were performed. Transabdominal technique was performed for global imaging of the pelvis including uterus, ovaries, adnexal regions, and pelvic cul-de-sac.  It was necessary to proceed with endovaginal exam following the transabdominal exam to visualize the adnexa.  Comparison:  None similar.  Findings:  Uterus: Unremarkable appearance, 8 x 4 x 5 cm. Ventral Nabothian cyst noted.  Endometrium: 6 mm in thickness, smooth and homogeneous.  Right ovary:  Not visualized.  Left ovary: Not visualized.  Other findings: No free fluid  IMPRESSION: 1. Limited study due to nonvisualization of the ovaries. 2.  Unremarkable uterus.   Original Report Authenticated By: Tiburcio Pea  US Pelvis Complete  03/15/2013   *RADIOLOGY REPORT*  Clinical Data: Pelvic pain.  Evaluate for torsion.  TRANSABDOMINAL AND TRANSVAGINAL ULTRASOUND OF PELVIS Technique:  Both transabdominal and transvaginal ultrasound examinations of the pelvis were performed. Transabdominal technique was performed for global imaging of the pelvis including uterus, ovaries, adnexal regions, and pelvic cul-de-sac.  It was necessary to proceed with endovaginal exam following the transabdominal exam to visualize the adnexa.  Comparison:  None similar.  Findings:  Uterus: Unremarkable appearance, 8 x 4 x 5 cm. Ventral Nabothian cyst noted.  Endometrium: 6 mm in thickness, smooth and homogeneous.  Right ovary:  Not visualized.  Left ovary: Not visualized.  Other findings: No free fluid  IMPRESSION: 1. Limited study due to nonvisualization of the ovaries. 2.  Unremarkable uterus.   Original Report Authenticated By: Tiburcio Pea   Ct Abdomen Pelvis W Contrast  03/15/2013   *RADIOLOGY REPORT*  Clinical Data: Left lower quadrant pelvic pain.  CT ABDOMEN AND PELVIS WITH CONTRAST  Technique:  Multidetector CT imaging of the abdomen and pelvis was  performed following the standard protocol during bolus administration of intravenous contrast.  Contrast: OMNIPAQUE IOHEXOL 300 MG/ML  SOLN  Comparison: 12/30/2010.  Findings:  BODY WALL: Fat containing umbilical hernia which is partly imaged. New thickening over the sac may represent interval surgery.  There is symmetric mildly enlarged inguinal lymph nodes, also present previously and thus considered reactive.  LOWER CHEST:  Mediastinum: Unremarkable.  Lungs/pleura: Small nodular densities at the bases, most notably a 8 mm subpleural nodule in the left lower lobe.  These are stable from 2 years prior and considered benign.  ABDOMEN/PELVIS:  Liver: No focal abnormality.  Biliary: No evidence of biliary obstruction or stone.  Pancreas: Unremarkable.  Spleen: Unremarkable.  Adrenals: Unremarkable.  Kidneys and ureters: No hydronephrosis or stone.  Bladder: Unremarkable.  Bowel: No obstruction. Normal appendix.  Retroperitoneum: Chronically enlarged external iliac chain lymph nodes, considered reactive given their stability over time.  Peritoneum: No free fluid or gas.  Reproductive: Symmetrically sized ovaries.  Vascular: Retroaortic left renal vein.  OSSEOUS: No acute abnormalities. No suspicious lytic or blastic lesions.  IMPRESSION:  1. No acute intra-abdominal abnormality. 2. Umbilical hernia containing fat.   Original Report Authenticated By: Tiburcio Pea    4:32 AM Suspect that the patient's symptoms are musculoskeletal.  Worse with movement.  No other acute process is seen.   I recommended PCP follow up in 2 days for creatinine recheck.  Roxy Horseman, PA-C 03/15/13 [phone]

## 2013-03-16 ENCOUNTER — Encounter: Payer: Self-pay | Admitting: Internal Medicine

## 2013-03-18 NOTE — ED Provider Notes (Signed)
Medical screening examination/treatment/procedure(s) were performed by non-physician practitioner and as supervising physician I was immediately available for consultation/collaboration.   Claudean Kinds, MD 03/18/13 [phone]

## 2013-03-18 NOTE — ED Provider Notes (Signed)
Medical screening examination/treatment/procedure(s) were performed by non-physician practitioner and as supervising physician I was immediately available for consultation/collaboration.   Claudean Kinds, MD 03/18/13 [phone]

## 2013-03-21 ENCOUNTER — Ambulatory Visit (INDEPENDENT_AMBULATORY_CARE_PROVIDER_SITE_OTHER): Payer: No Typology Code available for payment source | Admitting: Internal Medicine

## 2013-03-21 ENCOUNTER — Encounter: Payer: Self-pay | Admitting: Internal Medicine

## 2013-03-21 VITALS — BP 130/77 | HR 79 | Temp 98.0°F | Ht 66.0 in | Wt 318.7 lb

## 2013-03-21 DIAGNOSIS — I10 Essential (primary) hypertension: Secondary | ICD-10-CM

## 2013-03-21 DIAGNOSIS — M79604 Pain in right leg: Secondary | ICD-10-CM

## 2013-03-21 DIAGNOSIS — M79609 Pain in unspecified limb: Secondary | ICD-10-CM

## 2013-03-21 LAB — BASIC METABOLIC PANEL WITH GFR
BUN: 14 mg/dL (ref 6–23)
CO2: 27 mEq/L (ref 19–32)
Calcium: 10.1 mg/dL (ref 8.4–10.5)
Chloride: 105 mEq/L (ref 96–112)
Creat: 0.82 mg/dL (ref 0.50–1.10)
GFR, Est African American: >89 mL/min
GFR, Est Non African American: 81 mL/min
Glucose, Bld: 102 mg/dL — ABNORMAL HIGH (ref 70–99)
Potassium: 3.6 mEq/L (ref 3.5–5.3)
Sodium: 139 mEq/L (ref 135–145)

## 2013-03-21 LAB — CK: Total CK: 44 U/L (ref 7–177)

## 2013-03-21 NOTE — Assessment & Plan Note (Addendum)
Assessment: Patient had transient multiple complaints including abdominal discomfort, suprapubic pressure/pain, and B/L thigh pressure/pain, which are almost completely resolved without intervention. The etiology was unclear given unremarkable blood, urine, pelvic studies, and abd imaging study. The pertinent information include recent initiation of statin, which was stopped by patient on 03/14/13. Statin-induced myopathy is an another DDX, however her UA was "clean" during her ED visit, which indicated that myolysis was unlikely.   The another etiology could be MSK contributed by her significant obese.  Plan - check BMP - check CK - Hold Mevacor for now and follow up with her PCP in 2 weeks ( already has a f/u appt.) - No medical intervention since she is almost symptoms free.

## 2013-03-21 NOTE — Progress Notes (Signed)
Subjective:   Patient ID: [name] female   DOB: 1956-05-04 57 y.o.   MRN: [id number]  HPI: [name] is a 57 y.o. woman with a PMH of HTN, HLD, Asthma, Obesity, DJD, recent UTI on 03/06/13 and treated with Bactrim, who presents to the clinic for ED follow up.  Patient was evaluated for LLQ and L suprapubic pressure/pain at ED on 03/14/13. She reported that she also had associated  B/L thigh pressure and pain at that time. She underwent pelvic exam ( wet prep and GC/Chlamydia), Abd U/S and CT, all of which were unremarkable. The lab tests UA and CMP were unremarkable . She was noted to have mildly elevated Cr and instructed to follow up with the clinic.   Of note, Patient states that she started her statin( Mevecor) approximately one week prior to her abd and LE pain. She thinks that it caused all of her symptoms. She stopped her Mevacor on 03/14/13 and states that her symptoms are almost resolved completely.   Of note, she was found to have hematuria and a UTI on 03/06/13, and treated with Bactrim and resolved. LMP - 3 years ago. Denies hx of kidney stones.  Past Medical History  Diagnosis Date  . Hypertension   . Hyperlipidemia   . Asthma   . Obesity   . Lower extremity edema     Chronic. 2D echo (2005) - EF 65%.  . Seasonal allergies   . Abnormal EKG 06/2003    History of inverted T waves V1-V3. Normal 2D echo (07/23/2003): LVEF 65%.  . History of multiple pulmonary nodules     Incidental finding: CT Abd/ Pelvis (04/2010) - Several small lower lobe lung nodules, including one pure ground-glass pulmonary nodule measuring 8 mm in the left lower lobe.  Recommend follow-up chest CT (IV contrast preferred) in 6 months to document stability. //  CT Abd/ Pelvis (07/2010) -  3 mm RLL and  8 mm LLL nodule stable.  Other nodules unchanged, likely benign.  . 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 Followed by Sports Medicine, Dr. Jennette Kettle.  . Incarcerated ventral hernia  04/2010    Noted on CT Abd/ pelvis (04/2010). Patient now s/p ventral hernia repair by Dr. Gerrit Friends (12/2010)  . Anemia     BL Hgb 11-12. Ferritin 14 - low normal (08/2007). Colonoscopy 2009 - external hemorrhoids (excellent prep). Last anemia panel (12/2010) - Iron  24, TIBC 269,  B12  316, Folate 11.9, Ferritin 73.  . Wears dentures   . Wears glasses   . Knee osteoarthritis     s/p Left total knee replacement (06/2011)   Current Outpatient Prescriptions  Medication Sig Dispense Refill  . albuterol (PROVENTIL HFA;VENTOLIN HFA) 108 (90 BASE) MCG/ACT inhaler Inhale 2 puffs into the lungs every 4 (four) hours as needed for wheezing or shortness of breath.  1 Inhaler  0  . amLODipine (NORVASC) 5 MG tablet Take 1 tablet (5 mg total) by mouth daily.  30 tablet  11  . aspirin 81 MG tablet Take 1 tablet (81 mg total) by mouth daily.  30 tablet    . ferrous sulfate 325 (65 FE) MG tablet Take 325 mg by mouth daily with breakfast.      . ibuprofen (ADVIL,MOTRIN) 800 MG tablet Take 1 tablet (800 mg total) by mouth 3 (three) times daily.  90 tablet  11  . lisinopril-hydrochlorothiazide (ZESTORETIC) 20-12.5 MG per tablet Take 2 tablets by mouth daily.  60 tablet  11  . lovastatin (MEVACOR) 20 MG tablet Take 1 tablet (20 mg total) by mouth daily.  30 tablet  11  . methocarbamol (ROBAXIN) 500 MG tablet Take 500 mg by mouth 3 (three) times daily as needed. For muscle pain      . oxyCODONE-acetaminophen (PERCOCET/ROXICET) 5-325 MG per tablet Take 2 tablets by mouth every 6 (six) hours as needed for pain.  13 tablet  0  . ibuprofen (ADVIL,MOTRIN) 800 MG tablet Take 1 tablet (800 mg total) by mouth every 8 (eight) hours as needed for pain.  90 tablet  2  . sulfamethoxazole-trimethoprim (BACTRIM DS) 800-160 MG per tablet Take 1 tablet by mouth 2 (two) times daily.  14 tablet  0   No current facility-administered medications for this visit.   Family History  Problem Relation Age of Onset  . Diabetes Mother   .  Hypertension Mother   . Stroke Mother   . Heart attack Neg Hx   . Dementia Father    History   Social History  . Marital Status: Married    Spouse Name: N/A    Number of Children: N/A  . Years of Education: N/A   Social History Main Topics  . Smoking status: Former Smoker -- 0.50 packs/day for 20 years    Types: Cigarettes    Quit date: 09/10/1998  . Smokeless tobacco: None  . Alcohol Use: No  . Drug Use: No  . Sexual Activity: None   Other Topics Concern  . None   Social History Narrative  . None   Review of Systems: Review of Systems:  Constitutional:  Denies fever, chills, diaphoresis, appetite change and fatigue.   HEENT:  Denies congestion, sore throat, rhinorrhea, sneezing, mouth sores, trouble swallowing, neck pain   Respiratory:  Denies SOB, DOE, cough, and wheezing.   Cardiovascular:  Denies palpitations and leg swelling.   Gastrointestinal:  Denies nausea, vomiting, abdominal pain, diarrhea, constipation, blood in stool and abdominal distention.   Genitourinary:  Denies dysuria, urgency, frequency, hematuria, flank pain and difficulty urinating.   Musculoskeletal:  Denies myalgias, back pain, joint swelling, arthralgias and gait problem. Left lateral thigh mild pain   Skin:  Denies pallor, rash and wound.   Neurological:  Denies dizziness, seizures, syncope, weakness, light-headedness, numbness and headaches.    .    Objective:  Physical Exam: Filed Vitals:   03/21/13 1413  BP: 130/77  Pulse: 79  Temp: 98 F (36.7 C)  TempSrc: Oral  Height: 5\' 6"  (1.676 m)  Weight: 318 lb 11.2 oz (144.561 kg)  SpO2: 98%   General: NAD Neck: supple, full ROM, no thyromegaly, no JVD.  Lungs: CTA B/L Heart: RRR, No M/G/R Abdomen: soft, non-tender, normal bowel sounds, no distention, no guarding, no rebound tenderness. Msk:   Left lateral thigh mild subjective pain not worsening with palpation.  No erythema, swelling or warmth to touch. No numbness or tingling. SLRT  negative.  Pulses: 2+ DP/PT pulses bilaterally Extremities: No cyanosis, clubbing, edema Neurologic: alert & oriented X3.   Assessment & Plan:

## 2013-03-21 NOTE — Patient Instructions (Addendum)
1. Follow up with your PCP as scheduled 2 will check your CK and BMP 3. Stop Mevacor for now.

## 2013-03-22 NOTE — Progress Notes (Signed)
Case discussed with Dr. Li at the time of the visit.  We reviewed the resident's history and exam and pertinent patient test results.  I agree with the assessment, diagnosis, and plan of care documented in the resident's note. 

## 2013-04-09 ENCOUNTER — Encounter: Payer: Self-pay | Admitting: Internal Medicine

## 2013-04-09 ENCOUNTER — Ambulatory Visit (INDEPENDENT_AMBULATORY_CARE_PROVIDER_SITE_OTHER): Payer: No Typology Code available for payment source | Admitting: Internal Medicine

## 2013-04-09 VITALS — BP 154/77 | HR 77 | Temp 98.4°F | Ht 66.0 in | Wt 324.6 lb

## 2013-04-09 DIAGNOSIS — M549 Dorsalgia, unspecified: Secondary | ICD-10-CM

## 2013-04-09 DIAGNOSIS — Z23 Encounter for immunization: Secondary | ICD-10-CM

## 2013-04-09 DIAGNOSIS — R1032 Left lower quadrant pain: Secondary | ICD-10-CM

## 2013-04-09 DIAGNOSIS — E785 Hyperlipidemia, unspecified: Secondary | ICD-10-CM

## 2013-04-09 DIAGNOSIS — I10 Essential (primary) hypertension: Secondary | ICD-10-CM

## 2013-04-09 MED ORDER — ASPIRIN 81 MG PO TABS: 81.0000 mg | ORAL_TABLET | Freq: Every day | ORAL | Status: DC

## 2013-04-09 MED ORDER — FERROUS SULFATE 325 (65 FE) MG PO TABS: 325.0000 mg | ORAL_TABLET | Freq: Every day | ORAL | Status: DC

## 2013-04-09 MED ORDER — LISINOPRIL-HYDROCHLOROTHIAZIDE 20-12.5 MG PO TABS: 2.0000 | ORAL_TABLET | Freq: Every day | ORAL | Status: DC

## 2013-04-09 MED ORDER — CYCLOBENZAPRINE HCL 10 MG PO TABS: 10.0000 mg | ORAL_TABLET | Freq: Three times a day (TID) | ORAL | Status: DC | PRN

## 2013-04-09 MED ORDER — AMLODIPINE BESYLATE 5 MG PO TABS: 5.0000 mg | ORAL_TABLET | Freq: Every day | ORAL | Status: DC

## 2013-04-09 MED ORDER — METHOCARBAMOL 500 MG PO TABS: 500.0000 mg | ORAL_TABLET | Freq: Three times a day (TID) | ORAL | Status: DC | PRN

## 2013-04-09 MED ORDER — OXYCODONE-ACETAMINOPHEN 5-325 MG PO TABS: 2.0000 | ORAL_TABLET | Freq: Three times a day (TID) | ORAL | Status: DC | PRN

## 2013-04-09 MED ORDER — ALBUTEROL SULFATE HFA 108 (90 BASE) MCG/ACT IN AERS: 2.0000 | INHALATION_SPRAY | RESPIRATORY_TRACT | Status: DC | PRN

## 2013-04-09 NOTE — Assessment & Plan Note (Signed)
Pt request refill of oxycodone- acetaminophen today. She says she gets refills here only, and takes the medication about 2-3 times a week , as needed for back pain, that it is very effective and allows her to sleep at night. Also request refill of flexeril.

## 2013-04-09 NOTE — Assessment & Plan Note (Signed)
Last lipid profile- 11/02/2012, LDL- 145, Chol- 209, TG- 87. Pt was placed on pravastatin, says her whole body was in cramps with generalized aches. She reports she has not had similar episodes since d/c the drug. At this point we wont be starting any more meds.

## 2013-04-09 NOTE — Assessment & Plan Note (Addendum)
Blood pressure elevated today- 154/77. Currently on Lisinopril- HCT 20- 12.5mg , amlodipine- 5mg  dly. BMP- 03/21/2013-- Na- 139, K- 3.6, Cr- 0.82. Will continue blood pressure meds at same dose. Last three blood pressure mesurements-  BP& BMI  04/09/2013 03/21/2013 03/15/2013  BP 154/77 130/77 116/58  BMI 52.42 51.46 53.25   - Encourage lifestyle modification.

## 2013-04-09 NOTE — Progress Notes (Signed)
Patient ID: [name], female   DOB: [birth year], 56 y.o.   MRN: [id number]   Subjective:   Patient ID: [name] female   DOB: 13-Feb-1956 57 y.o.   MRN: [id number]  HPI: [name] is a 57 y.o. with PMH of  HTN, hyperlipidemia, Chronic back pain. Presents today with c/o of Abdominal pain present for the past month - Left lower quadrant, rates the pain- 8/10, non radiating, some relief with bowel movement, no known aggrv or relieving factors, no change in bowel habits, has a bowel movement everyday, no bloody stools or melaena. No vaginal discharge, no dysuric symptoms. Pt has had an umbilical hernia repair- last year, and a mesh was used. Pt has had extensive work up for same complaints. Presented to ED, had Abd Ct done- 03/15/2013- showed- Umb hernia containing fat, No acute intra abd abnormality. Abd USS- 03/14/2013, and Vag USS- 03/15/2013- showed no explanatory finding for abd pain. Pt had a Vaginal Swab which was neg for GC and Chlam, wet prep was negative. Pt was treated for a UTI- 03/06/2013 with Bactrim- Double strenght 1 tab BID X 7 days, post- UTI urinalysis was negative for infection.  Request refill of most of her meds and oxycodone-Acetaminophen.   Past Medical History  Diagnosis Date  . Hypertension   . Hyperlipidemia   . Asthma   . Obesity   . Lower extremity edema     Chronic. 2D echo (2005) - EF 65%.  . Seasonal allergies   . Abnormal EKG 06/2003    History of inverted T waves V1-V3. Normal 2D echo (07/23/2003): LVEF 65%.  . History of multiple pulmonary nodules     Incidental finding: CT Abd/ Pelvis (04/2010) - Several small lower lobe lung nodules, including one pure ground-glass pulmonary nodule measuring 8 mm in the left lower lobe.  Recommend follow-up chest CT (IV contrast preferred) in 6 months to document stability. //  CT Abd/ Pelvis (07/2010) -  3 mm RLL and  8 mm LLL nodule stable.  Other nodules unchanged, likely benign.  . Degenerative joint disease     BL  knees (L>R), lumbar spine. Followed by Sports Medicine, Dr. Jennette Kettle.  . Incarcerated ventral hernia 04/2010    Noted on CT Abd/ pelvis (04/2010). Patient now s/p ventral hernia repair by Dr. Gerrit Friends (12/2010)  . Anemia     BL Hgb 11-12. Ferritin 14 - low normal (08/2007). Colonoscopy 2009 - external hemorrhoids (excellent prep). Last anemia panel (12/2010) - Iron  24, TIBC 269,  B12  316, Folate 11.9, Ferritin 73.  . Wears dentures   . Wears glasses   . Knee osteoarthritis     s/p Left total knee replacement (06/2011)   Current Outpatient Prescriptions  Medication Sig Dispense Refill  . albuterol (PROVENTIL HFA;VENTOLIN HFA) 108 (90 BASE) MCG/ACT inhaler Inhale 2 puffs into the lungs every 4 (four) hours as needed for wheezing or shortness of breath.  1 Inhaler  2  . amLODipine (NORVASC) 5 MG tablet Take 1 tablet (5 mg total) by mouth daily.  30 tablet  11  . aspirin 81 MG tablet Take 1 tablet (81 mg total) by mouth daily.  30 tablet    . cyclobenzaprine (FLEXERIL) 10 MG tablet Take 1 tablet (10 mg total) by mouth every 8 (eight) hours as needed for muscle spasms.  30 tablet  1  . ferrous sulfate 325 (65 FE) MG tablet Take 1 tablet (325 mg total) by mouth daily with breakfast.  30 tablet  2  . ibuprofen (ADVIL,MOTRIN) 800 MG tablet Take 1 tablet (800 mg total) by mouth every 8 (eight) hours as needed for pain.  90 tablet  2  . ibuprofen (ADVIL,MOTRIN) 800 MG tablet Take 1 tablet (800 mg total) by mouth 3 (three) times daily.  90 tablet  11  . lisinopril-hydrochlorothiazide (ZESTORETIC) 20-12.5 MG per tablet Take 2 tablets by mouth daily.  60 tablet  11  . oxyCODONE-acetaminophen (PERCOCET/ROXICET) 5-325 MG per tablet Take 2 tablets by mouth every 8 (eight) hours as needed for pain.  15 tablet  0  . sulfamethoxazole-trimethoprim (BACTRIM DS) 800-160 MG per tablet Take 1 tablet by mouth 2 (two) times daily.  14 tablet  0   No current facility-administered medications for this visit.   Family  History  Problem Relation Age of Onset  . Diabetes Mother   . Hypertension Mother   . Stroke Mother   . Heart attack Neg Hx   . Dementia Father    History   Social History  . Marital Status: Married    Spouse Name: N/A    Number of Children: N/A  . Years of Education: N/A   Social History Main Topics  . Smoking status: Former Smoker -- 0.50 packs/day for 20 years    Types: Cigarettes    Quit date: 09/10/1998  . Smokeless tobacco: None  . Alcohol Use: No  . Drug Use: No  . Sexual Activity: None   Other Topics Concern  . None   Social History Narrative  . None   Review of Systems: CONSTITUTIONAL- No Fever, or weightloss. SKIN- No Rash, colour changes, itching. Mouth/throat- Sorethroat, dentures, bleeding gums. RESPIRATORY- Cough, SOB. CARDIAC- Palpitations, DOE, PND, chest pain. GI- No nausea, vomiting, diarrhoea, constipation, abd pain. URINARY- No Frequency, polyuria, nocturia, hesitancy, urgency,incontinence.  Objective:  Physical Exam: Filed Vitals:   04/09/13 1421  BP: 154/77  Pulse: 77  Temp: 98.4 F (36.9 C)  TempSrc: Oral  Height: 5\' 6"  (1.676 m)  Weight: 324 lb 9.6 oz (147.238 kg)  SpO2: 98%  GENERAL- alert, co-operative, appears as stated age, not in any distress. HEENT- Atraumatic, normocephalic, PERRL, EOMI, oral mucosa appears moist. No carotid bruit, no cervical LN enlargement, thyroid does not appear enlarged. CARDIAC- RRR, no murmurs, rubs or gallops. RESP- Moving equal volumes of air, and clear to auscultation bilaterally. ABDOMEN- Soft, Obese, infra-umbilical well healed surgical scar, about 5cm across, tender in Lt lower quad on deep and superficial palpation, no guarding or rebound, no palpable masses or organomegaly, bowel sounds present. NEURO- No obvious Cr N abnormality, Gait normal.  EXTREMITIES- pulse 2+, symmetric. SKIN- Warm, dry, No rash or lesion. PSYCH- Normal mood and affect, appropriate thought content and  speech.  Assessment & Plan:   The patient's case and plan of care was discussed with attending physician, Dr. Doreene Adas.  Please see Problem oriented charting for assessment and Plan.

## 2013-04-09 NOTE — Patient Instructions (Addendum)
We will like you to follow up with your surgeon since so far with Ct scan and Ultra sound scans we cant tell exactly what is causing your abdominal pain. It might be related to your surgery. We will send refills of your medications to your pharmacy. At this point we aren't very concerned about your blood pressure or cholesterol levels, so continue your present medications as already prescribed.

## 2013-04-30 NOTE — Progress Notes (Signed)
I saw and evaluated the patient.  I personally confirmed the key portions of the history and exam documented by Dr. Emokpae and I reviewed pertinent patient test results.  The assessment, diagnosis, and plan were formulated together and I agree with the documentation in the resident's note. 

## 2013-05-09 ENCOUNTER — Encounter (HOSPITAL_COMMUNITY): Payer: Self-pay | Admitting: Emergency Medicine

## 2013-05-09 ENCOUNTER — Emergency Department (HOSPITAL_COMMUNITY)
Admission: EM | Admit: 2013-05-09 | Discharge: 2013-05-09 | Disposition: A | Discharge status: Home / Self Care | Payer: No Typology Code available for payment source | Attending: Emergency Medicine | Admitting: Emergency Medicine

## 2013-05-09 ENCOUNTER — Emergency Department (HOSPITAL_COMMUNITY): Payer: No Typology Code available for payment source

## 2013-05-09 DIAGNOSIS — R0602 Shortness of breath: Secondary | ICD-10-CM | POA: Insufficient documentation

## 2013-05-09 DIAGNOSIS — M1990 Unspecified osteoarthritis, unspecified site: Secondary | ICD-10-CM | POA: Insufficient documentation

## 2013-05-09 DIAGNOSIS — Z87891 Personal history of nicotine dependence: Secondary | ICD-10-CM | POA: Insufficient documentation

## 2013-05-09 DIAGNOSIS — R079 Chest pain, unspecified: Secondary | ICD-10-CM

## 2013-05-09 DIAGNOSIS — M542 Cervicalgia: Secondary | ICD-10-CM | POA: Insufficient documentation

## 2013-05-09 DIAGNOSIS — Z96659 Presence of unspecified artificial knee joint: Secondary | ICD-10-CM | POA: Insufficient documentation

## 2013-05-09 DIAGNOSIS — Z8709 Personal history of other diseases of the respiratory system: Secondary | ICD-10-CM | POA: Insufficient documentation

## 2013-05-09 DIAGNOSIS — I10 Essential (primary) hypertension: Secondary | ICD-10-CM | POA: Insufficient documentation

## 2013-05-09 DIAGNOSIS — R252 Cramp and spasm: Secondary | ICD-10-CM

## 2013-05-09 DIAGNOSIS — R42 Dizziness and giddiness: Secondary | ICD-10-CM | POA: Insufficient documentation

## 2013-05-09 DIAGNOSIS — M62838 Other muscle spasm: Secondary | ICD-10-CM | POA: Insufficient documentation

## 2013-05-09 DIAGNOSIS — Z91040 Latex allergy status: Secondary | ICD-10-CM | POA: Insufficient documentation

## 2013-05-09 DIAGNOSIS — J45909 Unspecified asthma, uncomplicated: Secondary | ICD-10-CM | POA: Insufficient documentation

## 2013-05-09 DIAGNOSIS — R0789 Other chest pain: Secondary | ICD-10-CM | POA: Insufficient documentation

## 2013-05-09 DIAGNOSIS — Z79899 Other long term (current) drug therapy: Secondary | ICD-10-CM | POA: Insufficient documentation

## 2013-05-09 DIAGNOSIS — Z791 Long term (current) use of non-steroidal anti-inflammatories (NSAID): Secondary | ICD-10-CM | POA: Insufficient documentation

## 2013-05-09 DIAGNOSIS — E785 Hyperlipidemia, unspecified: Secondary | ICD-10-CM | POA: Insufficient documentation

## 2013-05-09 LAB — CBC WITH DIFFERENTIAL/PLATELET
Basophils Absolute: 0 K/uL (ref 0.0–0.1)
Basophils Relative: 1 % (ref 0–1)
Eosinophils Absolute: 0.3 K/uL (ref 0.0–0.7)
Eosinophils Relative: 6 % — ABNORMAL HIGH (ref 0–5)
HCT: 36.8 % (ref 36.0–46.0)
Hemoglobin: 11.9 g/dL — ABNORMAL LOW (ref 12.0–15.0)
Lymphocytes Relative: 43 % (ref 12–46)
Lymphs Abs: 2 K/uL (ref 0.7–4.0)
MCH: 29 pg (ref 26.0–34.0)
MCHC: 32.3 g/dL (ref 30.0–36.0)
MCV: 89.8 fL (ref 78.0–100.0)
Monocytes Absolute: 0.3 K/uL (ref 0.1–1.0)
Monocytes Relative: 6 % (ref 3–12)
Neutro Abs: 2.1 K/uL (ref 1.7–7.7)
Neutrophils Relative %: 45 % (ref 43–77)
Platelets: 313 K/uL (ref 150–400)
RBC: 4.1 MIL/uL (ref 3.87–5.11)
RDW: 14.2 % (ref 11.5–15.5)
WBC: 4.6 K/uL (ref 4.0–10.5)

## 2013-05-09 LAB — BASIC METABOLIC PANEL WITH GFR
BUN: 12 mg/dL (ref 6–23)
CO2: 24 meq/L (ref 19–32)
Calcium: 10.2 mg/dL (ref 8.4–10.5)
Chloride: 106 meq/L (ref 96–112)
Creatinine, Ser: 0.76 mg/dL (ref 0.50–1.10)
GFR calc Af Amer: >90 mL/min (ref >90)
GFR calc non Af Amer: >90 mL/min (ref >90)
Glucose, Bld: 91 mg/dL (ref 70–99)
Potassium: 4.2 meq/L (ref 3.5–5.1)
Sodium: 139 meq/L (ref 135–145)

## 2013-05-09 LAB — POCT I-STAT TROPONIN I
Troponin i, poc: 0.01 ng/mL (ref 0.00–0.08)
Troponin i, poc: 0.01 ng/mL (ref 0.00–0.08)

## 2013-05-09 MED ORDER — DIAZEPAM 5 MG PO TABS
5.0000 mg | ORAL_TABLET | Freq: Once | ORAL | Status: AC
  Administered 2013-05-09: 5 mg via ORAL
  Filled 2013-05-09: qty 1

## 2013-05-09 MED ORDER — DIAZEPAM 5 MG PO TABS: 5.0000 mg | ORAL_TABLET | Freq: Two times a day (BID) | ORAL | Status: DC

## 2013-05-09 NOTE — ED Notes (Signed)
Pt states she's been having neck pain x couple weeks, then few days ago neck pain started radiating down to L shoulder and chest area, pt states having shortness of breath and dizziness, denies any other symptoms.

## 2013-05-09 NOTE — ED Provider Notes (Signed)
CSN: [id number]     Arrival date & time 05/09/13  1048 History   First MD Initiated Contact with Patient 05/09/13 1110     Chief Complaint  Patient presents with  . Chest Pain  . Neck Pain   (Consider location/radiation/quality/duration/timing/severity/associated sxs/prior Treatment) HPI Comments: Patient presents emergency department with chief complaint of chest pain/neck pain. Patient states pain is been ongoing for the past couple weeks. Has been worsening over the past couple of days. She states the pain radiates from the left side of her neck to her shoulder and chest. Additionally, she endorses some shortness of breath and dizziness. She thinks this might be related to her asthma. She states the pain is moderate to severe.  It is worsened with movement.  She denies any MOI.    The history is provided by the patient. No language interpreter was used.    Past Medical History  Diagnosis Date  . Hypertension   . Hyperlipidemia   . Asthma   . Obesity   . Lower extremity edema     Chronic. 2D echo (2005) - EF 65%.  . Seasonal allergies   . Abnormal EKG 06/2003    History of inverted T waves V1-V3. Normal 2D echo (07/23/2003): LVEF 65%.  . History of multiple pulmonary nodules     Incidental finding: CT Abd/ Pelvis (04/2010) - Several small lower lobe lung nodules, including one pure ground-glass pulmonary nodule measuring 8 mm in the left lower lobe.  Recommend follow-up chest CT (IV contrast preferred) in 6 months to document stability. //  CT Abd/ Pelvis (07/2010) -  3 mm RLL and  8 mm LLL nodule stable.  Other nodules unchanged, likely benign.  . 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 Followed by Sports Medicine, Dr. Jennette Kettle.  . Incarcerated ventral hernia 04/2010    Noted on CT Abd/ pelvis (04/2010). Patient now s/p ventral hernia repair by Dr. Gerrit Friends (12/2010)  . Anemia     BL Hgb 11-12. Ferritin 14 - low normal (08/2007). Colonoscopy 2009 - external  hemorrhoids (excellent prep). Last anemia panel (12/2010) - Iron  24, TIBC 269,  B12  316, Folate 11.9, Ferritin 73.  . Wears dentures   . Wears glasses   . Knee osteoarthritis     s/p Left total knee replacement (06/2011)   Past Surgical History  Procedure Laterality Date  . Incision and drainage abscess anal  07/2008    I&D and debridgement of anorectal abscess  . Inguinal hernia repair    . Tubal ligation    . Incisional hernia repair  12/2010    Repair of incarcerated ventral incisional hernia with Ethicon mesh patch - performed by Dr. Gerrit Friends.   . Foot surgery  2004     left  . Total knee arthroplasty  06/16/2011    Left TOTAL KNEE ARTHROPLASTY;  Surgeon: Kennieth Rad;  Location: [facility] OR;  Service: Orthopedics;  Laterality: Left;  left total knee arthroplasty   Family History  Problem Relation Age of Onset  . Diabetes Mother   . Hypertension Mother   . Stroke Mother   . Heart attack Neg Hx   . Dementia Father    History  Substance Use Topics  . Smoking status: Former Smoker -- 0.50 packs/day for 20 years    Types: Cigarettes    Quit date: 09/10/1998  . Smokeless tobacco: Not on file  . Alcohol Use: No   OB History   Grav  Para Term Preterm Abortions TAB SAB Ect Mult Living                 Review of Systems  All other systems reviewed and are negative.    Allergies  Ketorolac tromethamine; Atrovent; and Latex  Home Medications   Current Outpatient Rx  Name  Route  Sig  Dispense  Refill  . albuterol (PROVENTIL HFA;VENTOLIN HFA) 108 (90 BASE) MCG/ACT inhaler   Inhalation   Inhale 2 puffs into the lungs every 4 (four) hours as needed for wheezing or shortness of breath.   1 Inhaler   2   . amLODipine (NORVASC) 5 MG tablet   Oral   Take 1 tablet (5 mg total) by mouth daily.   30 tablet   11   . cyclobenzaprine (FLEXERIL) 10 MG tablet   Oral   Take 1 tablet (10 mg total) by mouth every 8 (eight) hours as needed for muscle spasms.   30 tablet   1   .  ibuprofen (ADVIL,MOTRIN) 800 MG tablet   Oral   Take 1 tablet (800 mg total) by mouth 3 (three) times daily.   90 tablet   11   . lisinopril-hydrochlorothiazide (ZESTORETIC) 20-12.5 MG per tablet   Oral   Take 2 tablets by mouth daily.   60 tablet   11   . oxyCODONE-acetaminophen (PERCOCET/ROXICET) 5-325 MG per tablet   Oral   Take 2 tablets by mouth every 8 (eight) hours as needed for pain.   15 tablet   0   . EXPIRED: ibuprofen (ADVIL,MOTRIN) 800 MG tablet   Oral   Take 1 tablet (800 mg total) by mouth every 8 (eight) hours as needed for pain.   90 tablet   2    BP 171/84  Pulse 71  Temp(Src) 98.4 F (36.9 C) (Oral)  Resp 22  SpO2 99%  LMP 09/09/2009 Physical Exam  Nursing note and vitals reviewed. Constitutional: She is oriented to person, place, and time. She appears well-developed and well-nourished. No distress.  HENT:  Head: Normocephalic and atraumatic.  Eyes: Conjunctivae and EOM are normal. Right eye exhibits no discharge. Left eye exhibits no discharge. No scleral icterus.  Neck: Normal range of motion. Neck supple. No tracheal deviation present.  Cardiovascular: Normal rate, regular rhythm and normal heart sounds.  Exam reveals no gallop and no friction rub.   No murmur heard. Pulmonary/Chest: Effort normal and breath sounds normal. No respiratory distress. She has no wheezes.  Abdominal: Soft. She exhibits no distension. There is no tenderness.  Musculoskeletal: Normal range of motion.  Left sided cervical paraspinal muscles tender to palpation, no bony tenderness, step-offs, or gross abnormality or deformity of spine, patient is able to ambulate, moves all extremities    Neurological: She is alert and oriented to person, place, and time.  Sensation and strength intact bilaterally   Skin: Skin is warm. She is not diaphoretic.  Psychiatric: She has a normal mood and affect. Her behavior is normal. Judgment and thought content normal.    ED Course   Procedures (including critical care time) Results for orders placed during the hospital encounter of 05/09/13  BASIC METABOLIC PANEL      Result Value Range   Sodium 139  135 - 145 mEq/L   Potassium 4.2  3.5 - 5.1 mEq/L   Chloride 106  96 - 112 mEq/L   CO2 24  19 - 32 mEq/L   Glucose, Bld 91  70 - 99 mg/dL  BUN 12  6 - 23 mg/dL   Creatinine, Ser 5.62  0.50 - 1.10 mg/dL   Calcium 13.0  8.4 - 86.5 mg/dL   GFR calc non Af Amer >90  >90 mL/min   GFR calc Af Amer >90  >90 mL/min  CBC WITH DIFFERENTIAL      Result Value Range   WBC 4.6  4.0 - 10.5 K/uL   RBC 4.10  3.87 - 5.11 MIL/uL   Hemoglobin 11.9 (*) 12.0 - 15.0 g/dL   HCT 78.4  69.6 - 29.5 %   MCV 89.8  78.0 - 100.0 fL   MCH 29.0  26.0 - 34.0 pg   MCHC 32.3  30.0 - 36.0 g/dL   RDW 28.4  13.2 - 44.0 %   Platelets 313  150 - 400 K/uL   Neutrophils Relative % 45  43 - 77 %   Neutro Abs 2.1  1.7 - 7.7 K/uL   Lymphocytes Relative 43  12 - 46 %   Lymphs Abs 2.0  0.7 - 4.0 K/uL   Monocytes Relative 6  3 - 12 %   Monocytes Absolute 0.3  0.1 - 1.0 K/uL   Eosinophils Relative 6 (*) 0 - 5 %   Eosinophils Absolute 0.3  0.0 - 0.7 K/uL   Basophils Relative 1  0 - 1 %   Basophils Absolute 0.0  0.0 - 0.1 K/uL  POCT I-STAT TROPONIN I      Result Value Range   Troponin i, poc 0.01  0.00 - 0.08 ng/mL   Comment 3           POCT I-STAT TROPONIN I      Result Value Range   Troponin i, poc 0.01  0.00 - 0.08 ng/mL   Comment 3            Dg Chest 2 View  05/09/2013   CLINICAL DATA:  Left-sided chest pain  EXAM: CHEST  2 VIEW  COMPARISON:  08/07/2012  FINDINGS: The heart size and mediastinal contours are within normal limits. Both lungs are clear. The visualized skeletal structures are unremarkable.  IMPRESSION: No active cardiopulmonary disease.   Electronically Signed   By: Alcide Clever M.D.   On: 05/09/2013 12:57      EKG Interpretation     Ventricular Rate:  69 PR Interval:  150 QRS Duration: 76 QT Interval:  455 QTC  Calculation: 487 R Axis:   30 Text Interpretation:  Sinus rhythm Borderline prolonged QT interval Baseline wander in lead(s) I III V1 No significant change since last tracing            MDM   1. Muscle cramps   2. Chest pain     Patient with neck and chest pain.  I suspect that the pain is musculoskeletal.  It is reproducible with palpation and movement.  It has been ongoing for the past 1.5 weeks.  However, given the patient has a few cardiac risk factors, and she does endorse some shortness of breath, will check basic labs, x-ray, EKG, and will reevaluate. If labs and tests are reassuring, most likely diagnosis is muscle spasm.    Low risk for PE according to [facility]' PE Criteria.  1.3% chance of PE in an ED population.  Doubt cardiac, troponin is negative, EKG is normal, chest xray is neg.  Delta trop is negative.  Will discharge to home with PCP follow up.  Discussed with Dr. Anitra Lauth, who agrees with the plan.  Roxy Horseman, PA-C 05/09/13 1511

## 2013-05-09 NOTE — ED Notes (Signed)
Per lab, CBC clotted, phelbotomy to bedside to redraw

## 2013-05-09 NOTE — ED Provider Notes (Signed)
Medical screening examination/treatment/procedure(s) were performed by non-physician practitioner and as supervising physician I was immediately available for consultation/collaboration.  EKG Interpretation     Ventricular Rate:  69 PR Interval:  150 QRS Duration: 76 QT Interval:  455 QTC Calculation: 487 R Axis:   30 Text Interpretation:  Sinus rhythm Borderline prolonged QT interval Baseline wander in lead(s) I III V1 No significant change since last tracing              Gwyneth Sprout, MD 05/09/13 1517

## 2013-05-22 ENCOUNTER — Other Ambulatory Visit: Payer: Self-pay | Admitting: Orthopedic Surgery

## 2013-05-22 ENCOUNTER — Ambulatory Visit
Admission: RE | Admit: 2013-05-22 | Discharge: 2013-05-22 | Disposition: A | Discharge status: Home / Self Care | Payer: Medicare Other | Source: Ambulatory Visit | Attending: Orthopedic Surgery | Admitting: Orthopedic Surgery

## 2013-05-22 DIAGNOSIS — N644 Mastodynia: Secondary | ICD-10-CM

## 2013-05-22 DIAGNOSIS — M542 Cervicalgia: Secondary | ICD-10-CM

## 2013-05-22 DIAGNOSIS — R52 Pain, unspecified: Secondary | ICD-10-CM

## 2013-05-27 ENCOUNTER — Encounter (INDEPENDENT_AMBULATORY_CARE_PROVIDER_SITE_OTHER): Payer: Medicare Other | Admitting: Surgery

## 2013-06-17 ENCOUNTER — Telehealth: Payer: Self-pay | Admitting: *Deleted

## 2013-06-17 NOTE — Telephone Encounter (Signed)
That is fine, she can come in for assessment and follow up imaging.  Thanks.

## 2013-06-17 NOTE — Telephone Encounter (Signed)
t calls and states she has L breast pain, wants to be seen and possible mammogram

## 2013-06-21 ENCOUNTER — Ambulatory Visit (INDEPENDENT_AMBULATORY_CARE_PROVIDER_SITE_OTHER): Payer: Medicare Other | Admitting: Internal Medicine

## 2013-06-21 ENCOUNTER — Encounter: Payer: Self-pay | Admitting: Internal Medicine

## 2013-06-21 VITALS — BP 153/80 | HR 68 | Temp 97.6°F | Ht 65.5 in | Wt 338.3 lb

## 2013-06-21 DIAGNOSIS — N644 Mastodynia: Secondary | ICD-10-CM | POA: Insufficient documentation

## 2013-06-21 DIAGNOSIS — I10 Essential (primary) hypertension: Secondary | ICD-10-CM

## 2013-06-21 DIAGNOSIS — J984 Other disorders of lung: Secondary | ICD-10-CM

## 2013-06-21 MED ORDER — PANTOPRAZOLE SODIUM 20 MG PO TBEC: 20.0000 mg | DELAYED_RELEASE_TABLET | Freq: Every day | ORAL | Status: DC

## 2013-06-21 MED ORDER — AMLODIPINE BESYLATE 10 MG PO TABS: 10.0000 mg | ORAL_TABLET | Freq: Every day | ORAL | Status: DC

## 2013-06-21 NOTE — Assessment & Plan Note (Signed)
BP Readings from Last 3 Encounters:  06/21/13 153/80  05/09/13 156/74  04/09/13 154/77    Lab Results  Component Value Date   NA 139 05/09/2013   K 4.2 05/09/2013   CREATININE 0.76 05/09/2013    Assessment: Blood pressure control: mildly elevated Progress toward BP goal:  unchanged   Plan: Medications:  continue current medications but will increase amlodipine to 10mg  daily. Educational resources provided: Comptroller tools provided:   Other plans: F/u in 1 month for BP recheck

## 2013-06-21 NOTE — Progress Notes (Signed)
Pt aware of appt [facility] CT chest 06/24/13 10AM - no solids 4 hour before CT and pt will call and sch mammogram [facility]. Stanton Kidney Tuleen Mandelbaum RN  06/21/13 10:45AM

## 2013-06-21 NOTE — Addendum Note (Signed)
Addended by: Charlsie Merles F on: 06/21/2013 01:52 PM   Modules accepted: Orders

## 2013-06-21 NOTE — Assessment & Plan Note (Addendum)
Pt was seen in the ED and EKG and troponin were negative, so cardiac cause lower on the differential. Pain x1 mo that is reproducible on exam and is worse with movement. Pain is likely musculoskeletal in nature. However she is due for a repeat screening mammogram and for a repeat CT chest to evaluate her lung nodules which have previously been stable. She was advised to take Ibuprofen 400-600mg  for her pain every 6 hours as needed. Will provide PPI for her as well to prevent GI irritation.   Addendum: I called [name] because she does also take etodolac PRN for back pain, and she was advised not to take both the ibuprofen and the etodolac as they can cause GI irritation and bleeding. She states that she has 800mg  ibuprofen at home which she was going to take. I advised her to take the 800mg  q8h PRN pain and to take the Protonix 20mg  daily, which was e-prescribed to her pharmacy. She acknowledged understanding.

## 2013-06-21 NOTE — Progress Notes (Signed)
Patient ID: CASON DABNEY, female   DOB: 01-Jul-1956, 57 y.o.   MRN: [id number]  Subjective:   Patient ID: MURIELLE STANG female   DOB: 1956-05-28 57 y.o.   MRN: [id number]  HPI: Ms.Kaylany MCKINSLEY KOELZER is a 57 y.o. F w/ PMH HLD, HTN, asthma, morbid obesity, and lung nodules who presents today c/o left breast pain.  She had a normal mammogram 04/2012 but was to have a repeat screening mamogram in 1 years, which has not been done. However she presents today complaining of pain in her left breast. Pain x 1 mo. located on the medial portion of her breast that is worse with movement. She states that it is a pulling pain, 5/10.   She was seen at [facility] ED 04/29/13 with similar complaints. ACS was ruled out at that time with a negative EKG and negative delta troponin. Her pain was attributed to being musculoskeletal at that time.   She does have a h/o lung nodules, which have been stable based of her CT chest from 11/2011. She was due for a repeat contrasted CT chest in Oct, which has not yet been done.  Past Medical History  Diagnosis Date  . Hypertension   . Hyperlipidemia   . Asthma   . Obesity   . Lower extremity edema     Chronic. 2D echo (2005) - EF 65%.  . Seasonal allergies   . Abnormal EKG 06/2003    History of inverted T waves V1-V3. Normal 2D echo (07/23/2003): LVEF 65%.  . History of multiple pulmonary nodules     Incidental finding: CT Abd/ Pelvis (04/2010) - Several small lower lobe lung nodules, including one pure ground-glass pulmonary nodule measuring 8 mm in the left lower lobe.  Recommend follow-up chest CT (IV contrast preferred) in 6 months to document stability. //  CT Abd/ Pelvis (07/2010) -  3 mm RLL and  8 mm LLL nodule stable.  Other nodules unchanged, likely benign.  . 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 Followed by Sports Medicine, Dr. Jennette Kettle.  . Incarcerated ventral hernia 04/2010    Noted on CT Abd/ pelvis (04/2010). Patient now s/p ventral hernia repair  by Dr. Gerrit Friends (12/2010)  . Anemia     BL Hgb 11-12. Ferritin 14 - low normal (08/2007). Colonoscopy 2009 - external hemorrhoids (excellent prep). Last anemia panel (12/2010) - Iron  24, TIBC 269,  B12  316, Folate 11.9, Ferritin 73.  . Wears dentures   . Wears glasses   . Knee osteoarthritis     s/p Left total knee replacement (06/2011)   Current Outpatient Prescriptions  Medication Sig Dispense Refill  . albuterol (PROVENTIL HFA;VENTOLIN HFA) 108 (90 BASE) MCG/ACT inhaler Inhale 2 puffs into the lungs every 4 (four) hours as needed for wheezing or shortness of breath.  1 Inhaler  2  . amLODipine (NORVASC) 10 MG tablet Take 1 tablet (10 mg total) by mouth daily.  30 tablet  11  . etodolac (LODINE XL) 400 MG 24 hr tablet       . ibuprofen (ADVIL,MOTRIN) 800 MG tablet Take 1 tablet (800 mg total) by mouth 3 (three) times daily.  90 tablet  11  . lisinopril-hydrochlorothiazide (ZESTORETIC) 20-12.5 MG per tablet Take 2 tablets by mouth daily.  60 tablet  11  . oxyCODONE-acetaminophen (PERCOCET/ROXICET) 5-325 MG per tablet Take 2 tablets by mouth every 8 (eight) hours as needed for pain.  15 tablet  0  . diazepam (  VALIUM) 5 MG tablet Take 1 tablet (5 mg total) by mouth 2 (two) times daily.  10 tablet  0   No current facility-administered medications for this visit.   Family History  Problem Relation Age of Onset  . Diabetes Mother   . Hypertension Mother   . Stroke Mother   . Heart attack Neg Hx   . Dementia Father    History   Social History  . Marital Status: Married    Spouse Name: N/A    Number of Children: N/A  . Years of Education: N/A   Social History Main Topics  . Smoking status: Former Smoker -- 0.50 packs/day for 20 years    Types: Cigarettes    Quit date: 09/10/1998  . Smokeless tobacco: None  . Alcohol Use: No  . Drug Use: No  . Sexual Activity: None   Other Topics Concern  . None   Social History Narrative  . None   Review of Systems: A 12 point ROS was  performed; pertinent positives and negatives were noted in the HPI   Objective:  Physical Exam: Filed Vitals:   06/21/13 1004 06/21/13 1035  BP: 182/98 153/80  Pulse: 75 68  Temp: 97.6 F (36.4 C)   TempSrc: Oral   Height: 5' 5.5" (1.664 m)   Weight: 338 lb 4.8 oz (153.452 kg)   SpO2: 98%    Constitutional: Vital signs reviewed.  Patient is a well-developed and well-nourished female in no acute distress and cooperative with exam.   Head: Normocephalic and atraumatic Eyes: PERRL, EOMI, conjunctivae normal, No scleral icterus.  Neck: Supple, Trachea midline, normal ROM Cardiovascular: RRR, no MRG Pulmonary/Chest: normal respiratory effort, CTAB, no wheezes, rales, or rhonchi. Tenderness to palpation at the left medial breast close to the sternum. Tenderness to palpation of lateral breast near axilla; no nodules, erythema, or skin changes appreciated on breast exam.  Abdominal: Soft. Non-tender, non-distended, bowel sounds are normal Musculoskeletal: No joint deformities, erythema, or stiffness. Moves all 4 extremities. Neurological: A&O x3, non-focal Skin: Warm, dry and intact. No rash, cyanosis, or clubbing.  Psychiatric: Normal mood and affect. speech and behavior is normal.   Assessment & Plan:   Please refer to Problem List based Assessment and Plan

## 2013-06-21 NOTE — Progress Notes (Signed)
Case discussed with Dr. Glenn at the time of the visit.  We reviewed the resident's history and exam and pertinent patient test results.  I agree with the assessment, diagnosis, and plan of care documented in the resident's note.   

## 2013-06-21 NOTE — Assessment & Plan Note (Addendum)
Pt with h/o stable lung nodules. She does have an 8mm nodule in the LLL that has been stable since 07/2010. She is due for a repeat CT chest with contrast, and given her recent chest pain, will order CT today. If the lung nodules remain stable, which would be for over a 2 year period, no further diagnostic evaluation is needed.  - CT chest with contrast  Addendum: CT chest with stable appearing lung nodules, which were felt to be benign and require no further imaging.

## 2013-06-24 ENCOUNTER — Ambulatory Visit (HOSPITAL_COMMUNITY)
Admission: RE | Admit: 2013-06-24 | Discharge: 2013-06-24 | Disposition: A | Discharge status: Home / Self Care | Payer: Medicare Other | Source: Ambulatory Visit | Attending: Internal Medicine | Admitting: Internal Medicine

## 2013-06-24 DIAGNOSIS — R0789 Other chest pain: Secondary | ICD-10-CM | POA: Insufficient documentation

## 2013-06-24 DIAGNOSIS — I7090 Unspecified atherosclerosis: Secondary | ICD-10-CM | POA: Insufficient documentation

## 2013-06-24 DIAGNOSIS — R918 Other nonspecific abnormal finding of lung field: Secondary | ICD-10-CM | POA: Insufficient documentation

## 2013-06-24 DIAGNOSIS — J984 Other disorders of lung: Secondary | ICD-10-CM | POA: Insufficient documentation

## 2013-06-24 MED ORDER — IOHEXOL 300 MG/ML  SOLN
80.0000 mL | Freq: Once | INTRAMUSCULAR | Status: AC | PRN
  Administered 2013-06-24: 80 mL via INTRAVENOUS

## 2013-07-12 ENCOUNTER — Emergency Department (HOSPITAL_COMMUNITY)
Admission: EM | Admit: 2013-07-12 | Discharge: 2013-07-12 | Disposition: A | Discharge status: Home / Self Care | Payer: Medicare Other | Attending: Emergency Medicine | Admitting: Emergency Medicine

## 2013-07-12 ENCOUNTER — Other Ambulatory Visit: Payer: Self-pay | Admitting: Internal Medicine

## 2013-07-12 ENCOUNTER — Emergency Department (HOSPITAL_COMMUNITY): Payer: Medicare Other

## 2013-07-12 ENCOUNTER — Encounter (HOSPITAL_COMMUNITY): Payer: Self-pay | Admitting: Emergency Medicine

## 2013-07-12 ENCOUNTER — Ambulatory Visit (HOSPITAL_COMMUNITY)
Admission: RE | Admit: 2013-07-12 | Discharge: 2013-07-12 | Disposition: A | Discharge status: Home / Self Care | Payer: Medicare Other | Source: Ambulatory Visit | Attending: Internal Medicine | Admitting: Internal Medicine

## 2013-07-12 DIAGNOSIS — R072 Precordial pain: Secondary | ICD-10-CM | POA: Insufficient documentation

## 2013-07-12 DIAGNOSIS — J45909 Unspecified asthma, uncomplicated: Secondary | ICD-10-CM | POA: Insufficient documentation

## 2013-07-12 DIAGNOSIS — Z91040 Latex allergy status: Secondary | ICD-10-CM | POA: Insufficient documentation

## 2013-07-12 DIAGNOSIS — IMO0001 Reserved for inherently not codable concepts without codable children: Secondary | ICD-10-CM | POA: Insufficient documentation

## 2013-07-12 DIAGNOSIS — Z1231 Encounter for screening mammogram for malignant neoplasm of breast: Secondary | ICD-10-CM

## 2013-07-12 DIAGNOSIS — M1710 Unilateral primary osteoarthritis, unspecified knee: Secondary | ICD-10-CM | POA: Insufficient documentation

## 2013-07-12 DIAGNOSIS — I10 Essential (primary) hypertension: Secondary | ICD-10-CM | POA: Insufficient documentation

## 2013-07-12 DIAGNOSIS — M7989 Other specified soft tissue disorders: Secondary | ICD-10-CM | POA: Insufficient documentation

## 2013-07-12 DIAGNOSIS — Z87891 Personal history of nicotine dependence: Secondary | ICD-10-CM | POA: Insufficient documentation

## 2013-07-12 DIAGNOSIS — N644 Mastodynia: Secondary | ICD-10-CM

## 2013-07-12 DIAGNOSIS — Z862 Personal history of diseases of the blood and blood-forming organs and certain disorders involving the immune mechanism: Secondary | ICD-10-CM | POA: Insufficient documentation

## 2013-07-12 DIAGNOSIS — M1990 Unspecified osteoarthritis, unspecified site: Secondary | ICD-10-CM | POA: Insufficient documentation

## 2013-07-12 DIAGNOSIS — Z79899 Other long term (current) drug therapy: Secondary | ICD-10-CM | POA: Insufficient documentation

## 2013-07-12 DIAGNOSIS — R079 Chest pain, unspecified: Secondary | ICD-10-CM

## 2013-07-12 DIAGNOSIS — Z96659 Presence of unspecified artificial knee joint: Secondary | ICD-10-CM | POA: Insufficient documentation

## 2013-07-12 DIAGNOSIS — IMO0002 Reserved for concepts with insufficient information to code with codable children: Secondary | ICD-10-CM | POA: Insufficient documentation

## 2013-07-12 DIAGNOSIS — Z8719 Personal history of other diseases of the digestive system: Secondary | ICD-10-CM | POA: Insufficient documentation

## 2013-07-12 LAB — BASIC METABOLIC PANEL
BUN: 12 mg/dL (ref 6–23)
CO2: 27 mEq/L (ref 19–32)
Calcium: 9.8 mg/dL (ref 8.4–10.5)
Chloride: 107 mEq/L (ref 96–112)
Creatinine, Ser: 0.8 mg/dL (ref 0.50–1.10)
GFR calc Af Amer: >90 mL/min (ref >90)
GFR calc non Af Amer: 81 mL/min — ABNORMAL LOW (ref >90)
Glucose, Bld: 96 mg/dL (ref 70–99)
Potassium: 3.9 mEq/L (ref 3.7–5.3)
Sodium: 144 mEq/L (ref 137–147)

## 2013-07-12 LAB — CBC
HCT: 36.7 % (ref 36.0–46.0)
Hemoglobin: 12 g/dL (ref 12.0–15.0)
MCH: 29.6 pg (ref 26.0–34.0)
MCHC: 32.7 g/dL (ref 30.0–36.0)
MCV: 90.6 fL (ref 78.0–100.0)
Platelets: 344 10*3/uL (ref 150–400)
RBC: 4.05 MIL/uL (ref 3.87–5.11)
RDW: 13.9 % (ref 11.5–15.5)
WBC: 5.7 10*3/uL (ref 4.0–10.5)

## 2013-07-12 LAB — POCT I-STAT TROPONIN I: Troponin i, poc: 0.01 ng/mL (ref 0.00–0.08)

## 2013-07-12 MED ORDER — TRAMADOL HCL 50 MG PO TABS: 50.0000 mg | ORAL_TABLET | Freq: Four times a day (QID) | ORAL | Status: DC | PRN

## 2013-07-12 NOTE — ED Notes (Addendum)
Pt reports shes had CP and L side of body swelling and pain x 2 months. [facility]. [facility] up of lung nodules. States she continues to have cp and the L side fo her body hurts so she decided to come back to ED for eval today. States her chest feels tender when she touches it

## 2013-07-12 NOTE — ED Provider Notes (Signed)
CSN: [id number]     Arrival date & time 07/12/13  1615 History   First MD Initiated Contact with Patient 07/12/13 1703     Chief Complaint  Patient presents with  . Chest Pain   (Consider location/radiation/quality/duration/timing/severity/associated sxs/prior Treatment) HPI [name] is a 58 y.o. female who presents to emergency department complaining of chest pain and left body swelling. Patient states that she has had left-sided substernal chest pain for 2 months now. She states the pain is worsened with movement and palpation of her chest. She has been seen at [facility] long emergency department as well as here for the same, states she was seen by her primary care Dr. this week and had multiple tests done including lab work and CT scan. She states that no findings were discovered. She states that she was given pain medications which she's taking but they're not helping her pain. Patient states that she feels like her entire left side including left face, left arm, left thigh or swelling. She states this has been gone on for the same amount of time. She denies any prior heart problems. She does have history of asthma and she has been taking her inhaler with no relief. She denies any pleuritic component to her pain.  Past Medical History  Diagnosis Date  . Hypertension   . Hyperlipidemia   . Asthma   . Obesity   . Lower extremity edema     Chronic. 2D echo (2005) - EF 65%.  . Seasonal allergies   . Abnormal EKG 06/2003    History of inverted T waves V1-V3. Normal 2D echo (07/23/2003): LVEF 65%.  . History of multiple pulmonary nodules     Incidental finding: CT Abd/ Pelvis (04/2010) - Several small lower lobe lung nodules, including one pure ground-glass pulmonary nodule measuring 8 mm in the left lower lobe.  Recommend follow-up chest CT (IV contrast preferred) in 6 months to document stability. //  CT Abd/ Pelvis (07/2010) -  3 mm RLL and  8 mm LLL nodule stable.  Other nodules unchanged,  likely benign.  . 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 Followed by Sports Medicine, Dr. Jennette Kettle.  . Incarcerated ventral hernia 04/2010    Noted on CT Abd/ pelvis (04/2010). Patient now s/p ventral hernia repair by Dr. Gerrit Friends (12/2010)  . Anemia     BL Hgb 11-12. Ferritin 14 - low normal (08/2007). Colonoscopy 2009 - external hemorrhoids (excellent prep). Last anemia panel (12/2010) - Iron  24, TIBC 269,  B12  316, Folate 11.9, Ferritin 73.  . Wears dentures   . Wears glasses   . Knee osteoarthritis     s/p Left total knee replacement (06/2011)   Past Surgical History  Procedure Laterality Date  . Incision and drainage abscess anal  07/2008    I&D and debridgement of anorectal abscess  . Inguinal hernia repair    . Tubal ligation    . Incisional hernia repair  12/2010    Repair of incarcerated ventral incisional hernia with Ethicon mesh patch - performed by Dr. Gerrit Friends.   . Foot surgery  2004     left  . Total knee arthroplasty  06/16/2011    Left TOTAL KNEE ARTHROPLASTY;  Surgeon: Kennieth Rad;  Location: [facility] OR;  Service: Orthopedics;  Laterality: Left;  left total knee arthroplasty   Family History  Problem Relation Age of Onset  . Diabetes Mother   . Hypertension Mother   .  Stroke Mother   . Heart attack Neg Hx   . Dementia Father   . Rashes / Skin problems Maternal Grandfather    History  Substance Use Topics  . Smoking status: Former Smoker -- 0.50 packs/day for 20 years    Types: Cigarettes    Quit date: 09/10/1998  . Smokeless tobacco: Not on file  . Alcohol Use: No   OB History   Grav Para Term Preterm Abortions TAB SAB Ect Mult Living                 Review of Systems  Constitutional: Negative for fever and chills.  HENT: Negative for congestion, ear pain and sore throat.   Respiratory: Positive for chest tightness. Negative for cough and shortness of breath.   Cardiovascular: Positive for chest pain and leg swelling. Negative for  palpitations.  Gastrointestinal: Negative for nausea, vomiting, abdominal pain and diarrhea.  Genitourinary: Negative for dysuria, flank pain, vaginal bleeding, vaginal discharge, vaginal pain and pelvic pain.  Musculoskeletal: Positive for myalgias. Negative for arthralgias, neck pain and neck stiffness.  Skin: Negative for rash.  Neurological: Negative for dizziness, weakness and headaches.  All other systems reviewed and are negative.    Allergies  Ketorolac tromethamine; Atrovent; and Latex  Home Medications   Current Outpatient Rx  Name  Route  Sig  Dispense  Refill  . albuterol (PROVENTIL HFA;VENTOLIN HFA) 108 (90 BASE) MCG/ACT inhaler   Inhalation   Inhale 2 puffs into the lungs every 4 (four) hours as needed for wheezing or shortness of breath.   1 Inhaler   2   . amLODipine (NORVASC) 10 MG tablet   Oral   Take 1 tablet (10 mg total) by mouth daily.   30 tablet   11   . diazepam (VALIUM) 5 MG tablet   Oral   Take 1 tablet (5 mg total) by mouth 2 (two) times daily.   10 tablet   0   . etodolac (LODINE XL) 400 MG 24 hr tablet               . ibuprofen (ADVIL,MOTRIN) 800 MG tablet   Oral   Take 1 tablet (800 mg total) by mouth 3 (three) times daily.   90 tablet   11   . lisinopril-hydrochlorothiazide (ZESTORETIC) 20-12.5 MG per tablet   Oral   Take 2 tablets by mouth daily.   60 tablet   11   . oxyCODONE-acetaminophen (PERCOCET/ROXICET) 5-325 MG per tablet   Oral   Take 2 tablets by mouth every 8 (eight) hours as needed for pain.   15 tablet   0   . pantoprazole (PROTONIX) 20 MG tablet   Oral   Take 1 tablet (20 mg total) by mouth daily.   30 tablet   1    BP 151/82  Pulse 85  Temp(Src) 98.2 F (36.8 C) (Oral)  Resp 20  Ht 5\' 2"  (1.575 m)  Wt 329 lb 8 oz (149.46 kg)  BMI 60.25 kg/m2  SpO2 100%  LMP 09/09/2009 Physical Exam  Nursing note and vitals reviewed. Constitutional: She appears well-developed and well-nourished. No distress.   Morbidly obese  HENT:  Head: Normocephalic.  Eyes: Conjunctivae are normal.  Neck: Neck supple.  Cardiovascular: Normal rate, regular rhythm and normal heart sounds.   Pulmonary/Chest: Effort normal and breath sounds normal. No respiratory distress. She has no wheezes. She has no rales. She exhibits tenderness.  Tenderness over left costochondral joints along the sternum. No  rash, bruising, swelling  Abdominal: Soft. Bowel sounds are normal. She exhibits no distension. There is no tenderness. There is no rebound.  Musculoskeletal: She exhibits no edema.  i do not appreciate any edema given body habitus.  Neurological: She is alert.  Skin: Skin is warm and dry.  Psychiatric: She has a normal mood and affect. Her behavior is normal.    ED Course  Procedures (including critical care time) Labs Review Labs Reviewed  BASIC METABOLIC PANEL - Abnormal; Notable for the following:    GFR calc non Af Amer 81 (*)    All other components within normal limits  CBC  POCT I-STAT TROPONIN I   Imaging Review Dg Chest 2 View  07/12/2013   CLINICAL DATA:  Left-sided neck pain and swelling. Chest pain. Shortness breath. Dry cough.  EXAM: CHEST  2 VIEW  COMPARISON:  Chest x-ray 05/09/2013.  FINDINGS: Lung volumes are normal. No consolidative airspace disease. No pleural effusions. No pneumothorax. No pulmonary nodule or mass noted. Pulmonary vasculature and the cardiomediastinal silhouette are within normal limits.  IMPRESSION: 1.  No radiographic evidence of acute cardiopulmonary disease.   Electronically Signed   By: Trudie Reed M.D.   On: 07/12/2013 19:46    EKG Interpretation    Date/Time:  Friday July 12 2013 16:26:15 EST Ventricular Rate:  89 PR Interval:  148 QRS Duration: 76 QT Interval:  400 QTC Calculation: 486 R Axis:   0 Text Interpretation:  Normal sinus rhythm Prolonged QT No significant change since last tracing Confirmed by Anitra Lauth  MD, WHITNEY ([id number]) on 07/12/2013 6:10:28  PM            MDM   1. Chest pain     Pt with chest pain, reproducible with palpation and movement. Also reports left sided swelling which I cannot appreciate. Her VS are normal. She is non toxic appearing. Will get labs, CXR, trop. Her recent work up was unremarkable including CT chest with contrast.   8:09 PM CXR and labs normal. ECG showing prolonged QT which is unchanged. Discussed with Dr. Anitra Lauth and pt at length the plan. She will need further outpatient follow up for evaluation. She did have nodules seen on CT scan, she reports intermittent joint swelling, skin swelling and erythema. I suspet her symptoms may be rheumatological. Will given Dr. Aloha Gell number. Follow up closely.    Filed Vitals:   07/12/13 1845 07/12/13 1900 07/12/13 1945 07/12/13 2000  BP: 150/70 141/67 149/56 128/82  Pulse: 87 86 87 88  Temp:      TempSrc:      Resp: 23 17    Height:      Weight:      SpO2: 100% 98% 100% 100%       Lottie Mussel, PA-C 07/12/13 2011

## 2013-07-12 NOTE — ED Notes (Signed)
Pt has been having chest pain that is radiating up into her neck for 2 months. The other day the left side of the patient's face was swollen and her tongue was swollen. Pt states she has been having night sweats and having dry mouth. Pt states that her left hip has been swollen and painful. Pt is unable to lay on stomach and to turn over or sit up causes the chest pain.

## 2013-07-12 NOTE — Discharge Instructions (Signed)
Continue to take ibuprofen for pain. Take Ultram as prescribed as needed for severe pain. Please followup with your primary care Dr. for further evaluation of your symptoms. He can also choose to followup with Dr. Corliss Skainseveshwar, for possible rheumatological causing her symptoms.   Chest Pain (Nonspecific) It is often hard to give a specific diagnosis for the cause of chest pain. There is always a chance that your pain could be related to something serious, such as a heart attack or a blood clot in the lungs. You need to follow up with your caregiver for further evaluation. CAUSES   Heartburn.  Pneumonia or bronchitis.  Anxiety or stress.  Inflammation around your heart (pericarditis) or lung (pleuritis or pleurisy).  A blood clot in the lung.  A collapsed lung (pneumothorax). It can develop suddenly on its own (spontaneous pneumothorax) or from injury (trauma) to the chest.  Shingles infection (herpes zoster virus). The chest wall is composed of bones, muscles, and cartilage. Any of these can be the source of the pain.  The bones can be bruised by injury.  The muscles or cartilage can be strained by coughing or overwork.  The cartilage can be affected by inflammation and become sore (costochondritis). DIAGNOSIS  Lab tests or other studies, such as X-rays, electrocardiography, stress testing, or cardiac imaging, may be needed to find the cause of your pain.  TREATMENT   Treatment depends on what may be causing your chest pain. Treatment may include:  Acid blockers for heartburn.  Anti-inflammatory medicine.  Pain medicine for inflammatory conditions.  Antibiotics if an infection is present.  You may be advised to change lifestyle habits. This includes stopping smoking and avoiding alcohol, caffeine, and chocolate.  You may be advised to keep your head raised (elevated) when sleeping. This reduces the chance of acid going backward from your stomach into your esophagus.  Most of  the time, nonspecific chest pain will improve within 2 to 3 days with rest and mild pain medicine. HOME CARE INSTRUCTIONS   If antibiotics were prescribed, take your antibiotics as directed. Finish them even if you start to feel better.  For the next few days, avoid physical activities that bring on chest pain. Continue physical activities as directed.  Do not smoke.  Avoid drinking alcohol.  Only take over-the-counter or prescription medicine for pain, discomfort, or fever as directed by your caregiver.  Follow your caregiver's suggestions for further testing if your chest pain does not go away.  Keep any follow-up appointments you made. If you do not go to an appointment, you could develop lasting (chronic) problems with pain. If there is any problem keeping an appointment, you must call to reschedule. SEEK MEDICAL CARE IF:   You think you are having problems from the medicine you are taking. Read your medicine instructions carefully.  Your chest pain does not go away, even after treatment.  You develop a rash with blisters on your chest. SEEK IMMEDIATE MEDICAL CARE IF:   You have increased chest pain or pain that spreads to your arm, neck, jaw, back, or abdomen.  You develop shortness of breath, an increasing cough, or you are coughing up blood.  You have severe back or abdominal pain, feel nauseous, or vomit.  You develop severe weakness, fainting, or chills.  You have a fever. THIS IS AN EMERGENCY. Do not wait to see if the pain will go away. Get medical help at once. Call your local emergency services (911 in [place] Do not drive yourself  to the hospital. MAKE SURE YOU:   Understand these instructions.  Will watch your condition.  Will get help right away if you are not doing well or get worse. Document Released: 04/06/2005 Document Revised: 09/19/2011 Document Reviewed: 01/31/2008 [facility] Patient Information 2014 [place].

## 2013-07-12 NOTE — ED Provider Notes (Signed)
Medical screening examination/treatment/procedure(s) were performed by non-physician practitioner and as supervising physician I was immediately available for consultation/collaboration.  EKG Interpretation    Date/Time:  Friday July 12 2013 16:26:15 EST Ventricular Rate:  89 PR Interval:  148 QRS Duration: 76 QT Interval:  400 QTC Calculation: 486 R Axis:   0 Text Interpretation:  Normal sinus rhythm Prolonged QT No significant change since last tracing Confirmed by Anitra LauthPLUNKETT  MD, Shonice Wrisley ([id number]) on 07/12/2013 6:10:28 PM              Gwyneth SproutWhitney Avis Tirone, MD 07/12/13 2146

## 2013-07-12 NOTE — ED Notes (Signed)
Pt states she is having more left sided pain, but it still coming and going. Pt states there is swelling, but none is noted by the RN

## 2013-07-12 NOTE — ED Notes (Signed)
PA at bedside.

## 2013-07-22 ENCOUNTER — Ambulatory Visit (INDEPENDENT_AMBULATORY_CARE_PROVIDER_SITE_OTHER): Payer: Medicare Other | Admitting: Internal Medicine

## 2013-07-22 ENCOUNTER — Encounter: Payer: Self-pay | Admitting: Internal Medicine

## 2013-07-22 VITALS — BP 159/90 | HR 77 | Temp 97.9°F | Ht 65.0 in | Wt 327.0 lb

## 2013-07-22 DIAGNOSIS — M5430 Sciatica, unspecified side: Secondary | ICD-10-CM | POA: Insufficient documentation

## 2013-07-22 DIAGNOSIS — M5431 Sciatica, right side: Secondary | ICD-10-CM

## 2013-07-22 NOTE — Patient Instructions (Signed)
I am sorry you are in pain.   We will make a referral to the sports medicine doctors to help treat your pain with possible steriod shot. If the pain gets worse go to the ED for pain relief.   Sciatica Sciatica is pain, weakness, numbness, or tingling along the path of the sciatic nerve. The nerve starts in the lower back and runs down the back of each leg. The nerve controls the muscles in the lower leg and in the back of the knee, while also providing sensation to the back of the thigh, lower leg, and the sole of your foot. Sciatica is a symptom of another medical condition. For instance, nerve damage or certain conditions, such as a herniated disk or bone spur on the spine, pinch or put pressure on the sciatic nerve. This causes the pain, weakness, or other sensations normally associated with sciatica. Generally, sciatica only affects one side of the body. CAUSES   Herniated or slipped disc.  Degenerative disk disease.  A pain disorder involving the narrow muscle in the buttocks (piriformis syndrome).  Pelvic injury or fracture.  Pregnancy.  Tumor (rare). SYMPTOMS  Symptoms can vary from mild to very severe. The symptoms usually travel from the low back to the buttocks and down the back of the leg. Symptoms can include:  Mild tingling or dull aches in the lower back, leg, or hip.  Numbness in the back of the calf or sole of the foot.  Burning sensations in the lower back, leg, or hip.  Sharp pains in the lower back, leg, or hip.  Leg weakness.  Severe back pain inhibiting movement. These symptoms may get worse with coughing, sneezing, laughing, or prolonged sitting or standing. Also, being overweight may worsen symptoms. DIAGNOSIS  Your caregiver will perform a physical exam to look for common symptoms of sciatica. He or she may ask you to do certain movements or activities that would trigger sciatic nerve pain. Other tests may be performed to find the cause of the sciatica.  These may include:  Blood tests.  X-rays.  Imaging tests, such as an MRI or CT scan. TREATMENT  Treatment is directed at the cause of the sciatic pain. Sometimes, treatment is not necessary and the pain and discomfort goes away on its own. If treatment is needed, your caregiver may suggest:  Over-the-counter medicines to relieve pain.  Prescription medicines, such as anti-inflammatory medicine, muscle relaxants, or narcotics.  Applying heat or ice to the painful area.  Steroid injections to lessen pain, irritation, and inflammation around the nerve.  Reducing activity during periods of pain.  Exercising and stretching to strengthen your abdomen and improve flexibility of your spine. Your caregiver may suggest losing weight if the extra weight makes the back pain worse.  Physical therapy.  Surgery to eliminate what is pressing or pinching the nerve, such as a bone spur or part of a herniated disk. HOME CARE INSTRUCTIONS   Only take over-the-counter or prescription medicines for pain or discomfort as directed by your caregiver.  Apply ice to the affected area for 20 minutes, 3 4 times a day for the first 48 72 hours. Then try heat in the same way.  Exercise, stretch, or perform your usual activities if these do not aggravate your pain.  Attend physical therapy sessions as directed by your caregiver.  Keep all follow-up appointments as directed by your caregiver.  Do not wear high heels or shoes that do not provide proper support.  Check your  mattress to see if it is too soft. A firm mattress may lessen your pain and discomfort. SEEK IMMEDIATE MEDICAL CARE IF:   You lose control of your bowel or bladder (incontinence).  You have increasing weakness in the lower back, pelvis, buttocks, or legs.  You have redness or swelling of your back.  You have a burning sensation when you urinate.  You have pain that gets worse when you lie down or awakens you at night.  Your pain  is worse than you have experienced in the past.  Your pain is lasting longer than 4 weeks.  You are suddenly losing weight without reason. MAKE SURE YOU:  Understand these instructions.  Will watch your condition.  Will get help right away if you are not doing well or get worse. Document Released: 06/21/2001 Document Revised: 12/27/2011 Document Reviewed: 11/06/2011 [facility] Patient Information 2014 [place].

## 2013-07-22 NOTE — Progress Notes (Signed)
Subjective:    Patient ID: [name], female    DOB: 20-Jul-1955, 58 y.o.   MRN: [id number]  HPI [name] is a 58 yo woman pmh as listed below presents for acute back/hip pain.   Pt states that her pain started 3 days ago that starts in her right hip and then radiates down her right leg into her groin. It feels like a pulling sensation and sometimes burning sensation. Standing and laying flat seems to relief the pain and therefore the pt has been in bed most of the time. Bending over and going up/down stairs also makes the pain worse. She uses a cane intermittently given her hx of knee replacement. She has been trying OTC ibuprophen 800mg  TID with some minimal relief. She also has muscle relaxants and narcotics that have provided only minimal relief. Pt has not had any bowel or urinary incontinence, muscle weakness, inability to angulate, or loss of sensation in that leg.   Review of Systems  Constitutional: Negative for fever and fatigue.  Respiratory: Negative for shortness of breath.   Cardiovascular: Negative for chest pain, palpitations and leg swelling.  Gastrointestinal: Negative for nausea, vomiting, abdominal pain, diarrhea, constipation and blood in stool.  Genitourinary: Negative for hematuria, flank pain and difficulty urinating.  Musculoskeletal: Positive for back pain, joint swelling (feels right buttock is swollen) and myalgias (buttock pain/soreness). Negative for gait problem.  Neurological: Positive for numbness. Negative for weakness.    Past Medical History  Diagnosis Date  . Hypertension   . Hyperlipidemia   . Asthma   . Obesity   . Lower extremity edema     Chronic. 2D echo (2005) - EF 65%.  . Seasonal allergies   . Abnormal EKG 06/2003    History of inverted T waves V1-V3. Normal 2D echo (07/23/2003): LVEF 65%.  . History of multiple pulmonary nodules     Incidental finding: CT Abd/ Pelvis (04/2010) - Several small lower lobe lung nodules, including one  pure ground-glass pulmonary nodule measuring 8 mm in the left lower lobe.  Recommend follow-up chest CT (IV contrast preferred) in 6 months to document stability. //  CT Abd/ Pelvis (07/2010) -  3 mm RLL and  8 mm LLL nodule stable.  Other nodules unchanged, likely benign.  . 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 Followed by Sports Medicine, Dr. Jennette Kettle.  . Incarcerated ventral hernia 04/2010    Noted on CT Abd/ pelvis (04/2010). Patient now s/p ventral hernia repair by Dr. Gerrit Friends (12/2010)  . Anemia     BL Hgb 11-12. Ferritin 14 - low normal (08/2007). Colonoscopy 2009 - external hemorrhoids (excellent prep). Last anemia panel (12/2010) - Iron  24, TIBC 269,  B12  316, Folate 11.9, Ferritin 73.  . Wears dentures   . Wears glasses   . Knee osteoarthritis     s/p Left total knee replacement (06/2011)   Surgical, social, and family hx reviewed with the patient.     Objective:   Physical Exam Filed Vitals:   07/22/13 0908  BP: 159/90  Pulse: 77  Temp: 97.9 F (36.6 C)   General: sitting in chair, uncomfortable but NAD HEENT: PERRL, EOMI, no scleral icterus Cardiac: RRR, no rubs, murmurs or gallops Pulm: clear to auscultation bilaterally, moving normal volumes of air Abd: soft, nontender, nondistended, BS present Ext: warm and well perfused, no pedal edema, + straight leg raise, ttp in sciatica, tense psiformis muscle, 5/5 LE strength bilaterally  Neuro: alert and oriented X3, cranial nerves II-XII grossly intact     Assessment & Plan:  Please see problem oriented charting.   Pt discussed with Dr. Meredith PelJoines

## 2013-07-22 NOTE — Assessment & Plan Note (Signed)
Patient had a positive straight leg raise and tender to palpation over SI joint that exacerbates and reproduces the pain. Patient has had a history of MDD in the past per imaging back in 01/2009 the patient does not have warning signs of spinal cord compression at this time. -Sports medicine referral -Patient already has a narcotics and muscle relaxants for supportive management -Patient was educated on warning symptoms that would require immediate evaluation and imaging

## 2013-07-23 NOTE — Progress Notes (Signed)
Case discussed with Dr. Sadek soon after the resident saw the patient.  We reviewed the resident's history and exam and pertinent patient test results.  I agree with the assessment, diagnosis, and plan of care documented in the resident's note. 

## 2013-08-01 ENCOUNTER — Encounter: Payer: Self-pay | Admitting: Family Medicine

## 2013-08-01 ENCOUNTER — Ambulatory Visit (INDEPENDENT_AMBULATORY_CARE_PROVIDER_SITE_OTHER): Payer: Medicare Other | Admitting: Family Medicine

## 2013-08-01 VITALS — BP 139/83 | HR 74 | Ht 65.0 in | Wt 318.0 lb

## 2013-08-01 DIAGNOSIS — M5416 Radiculopathy, lumbar region: Secondary | ICD-10-CM

## 2013-08-01 DIAGNOSIS — M549 Dorsalgia, unspecified: Secondary | ICD-10-CM

## 2013-08-01 DIAGNOSIS — IMO0002 Reserved for concepts with insufficient information to code with codable children: Secondary | ICD-10-CM

## 2013-08-01 MED ORDER — PREDNISONE 10 MG PO TABS: ORAL_TABLET | ORAL | Status: DC

## 2013-08-01 NOTE — Patient Instructions (Signed)
You have lumbar radiculopathy (a pinched nerve in your low back), less likely sciatica. Take tylenol for baseline pain relief (1-2 extra strength tabs 3x/day) A prednisone dose pack is the best option for immediate relief and may be prescribed. Day after finishing prednisone start aleve 2 tabs twice a day with food for pain and inflammation. Consider flexeril as needed for muscle spasms (no driving on this medicine if it makes you sleepy). Stay as active as possible. Physical therapy has been shown to be helpful as well - start this and do home exercises on days you don't go to therapy. Strengthening of low back muscles, abdominal musculature are key for long term pain relief. If not improving, will consider further imaging (MRI). Follow up with me in 1 week if still very painful.  Otherwise come back in 1 month.

## 2013-08-06 ENCOUNTER — Encounter: Payer: Self-pay | Admitting: Family Medicine

## 2013-08-06 NOTE — Assessment & Plan Note (Signed)
Most consistent with lumbar radiculopathy (low back pain, radiates down leg into groin).  Less likely sciatica.  Will start with prednisone, transition to aleve.  Start formal physical therapy and home exercise program.  Consider imaging if not improving over next week or two.

## 2013-08-06 NOTE — Progress Notes (Signed)
Patient ID: [name], female   DOB: 01-Jan-1956, 58 y.o.   MRN: [id number]  PCP: Kennis CarinaEmokpae, [place], MD  Subjective:   HPI: Patient is a 58 y.o. female here for low back pain.  Patient reports for about 2 weeks she has had pain in right side of low back radiating into right groin. Also going down right leg. Some pain into left groin. Describes pain as a constant aching. No numbness/tingling regularly - right leg occasionally will fall asleep with certain positions. No bowel/bladder dysfunction. Tried ibuprofen and pain medication.   Past Medical History  Diagnosis Date  . Hypertension   . Hyperlipidemia   . Asthma   . Obesity   . Lower extremity edema     Chronic. 2D echo (2005) - EF 65%.  . Seasonal allergies   . Abnormal EKG 06/2003    History of inverted T waves V1-V3. Normal 2D echo (07/23/2003): LVEF 65%.  . History of multiple pulmonary nodules     Incidental finding: CT Abd/ Pelvis (04/2010) - Several small lower lobe lung nodules, including one pure ground-glass pulmonary nodule measuring 8 mm in the left lower lobe.  Recommend follow-up chest CT (IV contrast preferred) in 6 months to document stability. //  CT Abd/ Pelvis (07/2010) -  3 mm RLL and  8 mm LLL nodule stable.  Other nodules unchanged, likely benign.  . 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 Followed by Sports Medicine, Dr. Jennette KettleNeal.  . Incarcerated ventral hernia 04/2010    Noted on CT Abd/ pelvis (04/2010). Patient now s/p ventral hernia repair by Dr. Gerrit FriendsGerkin (12/2010)  . Anemia     BL Hgb 11-12. Ferritin 14 - low normal (08/2007). Colonoscopy 2009 - external hemorrhoids (excellent prep). Last anemia panel (12/2010) - Iron  24, TIBC 269,  B12  316, Folate 11.9, Ferritin 73.  . Wears dentures   . Wears glasses   . Knee osteoarthritis     s/p Left total knee replacement (06/2011)    Current Outpatient Prescriptions on File Prior to Visit  Medication Sig Dispense Refill  .  albuterol (PROVENTIL HFA;VENTOLIN HFA) 108 (90 BASE) MCG/ACT inhaler Inhale 2 puffs into the lungs every 4 (four) hours as needed for wheezing or shortness of breath.  1 Inhaler  2  . amLODipine (NORVASC) 10 MG tablet Take 1 tablet (10 mg total) by mouth daily.  30 tablet  11  . lisinopril-hydrochlorothiazide (ZESTORETIC) 20-12.5 MG per tablet Take 2 tablets by mouth daily.  60 tablet  11  . pantoprazole (PROTONIX) 20 MG tablet Take 1 tablet (20 mg total) by mouth daily.  30 tablet  1  . traMADol (ULTRAM) 50 MG tablet Take 1 tablet (50 mg total) by mouth every 6 (six) hours as needed.  20 tablet  0   No current facility-administered medications on file prior to visit.    Past Surgical History  Procedure Laterality Date  . Incision and drainage abscess anal  07/2008    I&D and debridgement of anorectal abscess  . Inguinal hernia repair    . Tubal ligation    . Incisional hernia repair  12/2010    Repair of incarcerated ventral incisional hernia with Ethicon mesh patch - performed by Dr. Gerrit FriendsGerkin.   . Foot surgery  2004     left  . Total knee arthroplasty  06/16/2011    Left TOTAL KNEE ARTHROPLASTY;  Surgeon: Kennieth RadArthur F Carter;  Location: [facility] OR;  Service: Orthopedics;  Laterality: Left;  left total knee arthroplasty    Allergies  Allergen Reactions  . Ketorolac Tromethamine Shortness Of Breath and Palpitations  . Atrovent [Ipratropium] Hives and Rash  . Latex Other (See Comments)    Powdered. Confirm type of reaction with patient.    History   Social History  . Marital Status: Married    Spouse Name: N/A    Number of Children: N/A  . Years of Education: N/A   Occupational History  . Not on file.   Social History Main Topics  . Smoking status: Former Smoker -- 0.50 packs/day for 20 years    Types: Cigarettes    Quit date: 09/10/1998  . Smokeless tobacco: Not on file  . Alcohol Use: No  . Drug Use: No  . Sexual Activity: Not on file   Other Topics Concern  . Not on file    Social History Narrative  . No narrative on file    Family History  Problem Relation Age of Onset  . Diabetes Mother   . Hypertension Mother   . Stroke Mother   . Heart attack Neg Hx   . Dementia Father   . Rashes / Skin problems Maternal Grandfather     BP 139/83  Pulse 74  Ht 5\' 5"  (1.651 m)  Wt 318 lb (144.244 kg)  BMI 52.92 kg/m2  LMP 09/09/2009  Review of Systems: See HPI above.    Objective:  Physical Exam:  Gen: NAD, obese.  Back: No gross deformity, scoliosis. TTP right paraspinal lumbar region, buttock, lateral hip .  No midline or bony TTP. FROM with pain on flexion. Strength LEs 5/5 all muscle groups.   Trace MSRs in patellar and achilles tendons, equal bilaterally. Negative SLRs.  Poor hamstring flexibility. Sensation intact to light touch bilaterally. Negative logroll bilateral hips    Assessment & Plan:  1. Low back pain - Most consistent with lumbar radiculopathy (low back pain, radiates down leg into groin).  Less likely sciatica.  Will start with prednisone, transition to aleve.  Start formal physical therapy and home exercise program.  Consider imaging if not improving over next week or two.

## 2013-08-12 ENCOUNTER — Ambulatory Visit: Payer: Medicare Other | Admitting: Physical Therapy

## 2013-08-14 ENCOUNTER — Ambulatory Visit: Payer: Medicare Other | Attending: Family Medicine | Admitting: Physical Therapy

## 2013-09-02 ENCOUNTER — Ambulatory Visit: Payer: Medicare Other | Admitting: Family Medicine

## 2013-09-09 IMAGING — CR DG KNEE 1-2V*L*
2 series · 2 of 2 positions shown · non-contrast
Comparison: 12/30/2010

CLINICAL DATA: Pain, no trauma

LEFT KNEE - 1-2 VIEW

[t knee ap left]
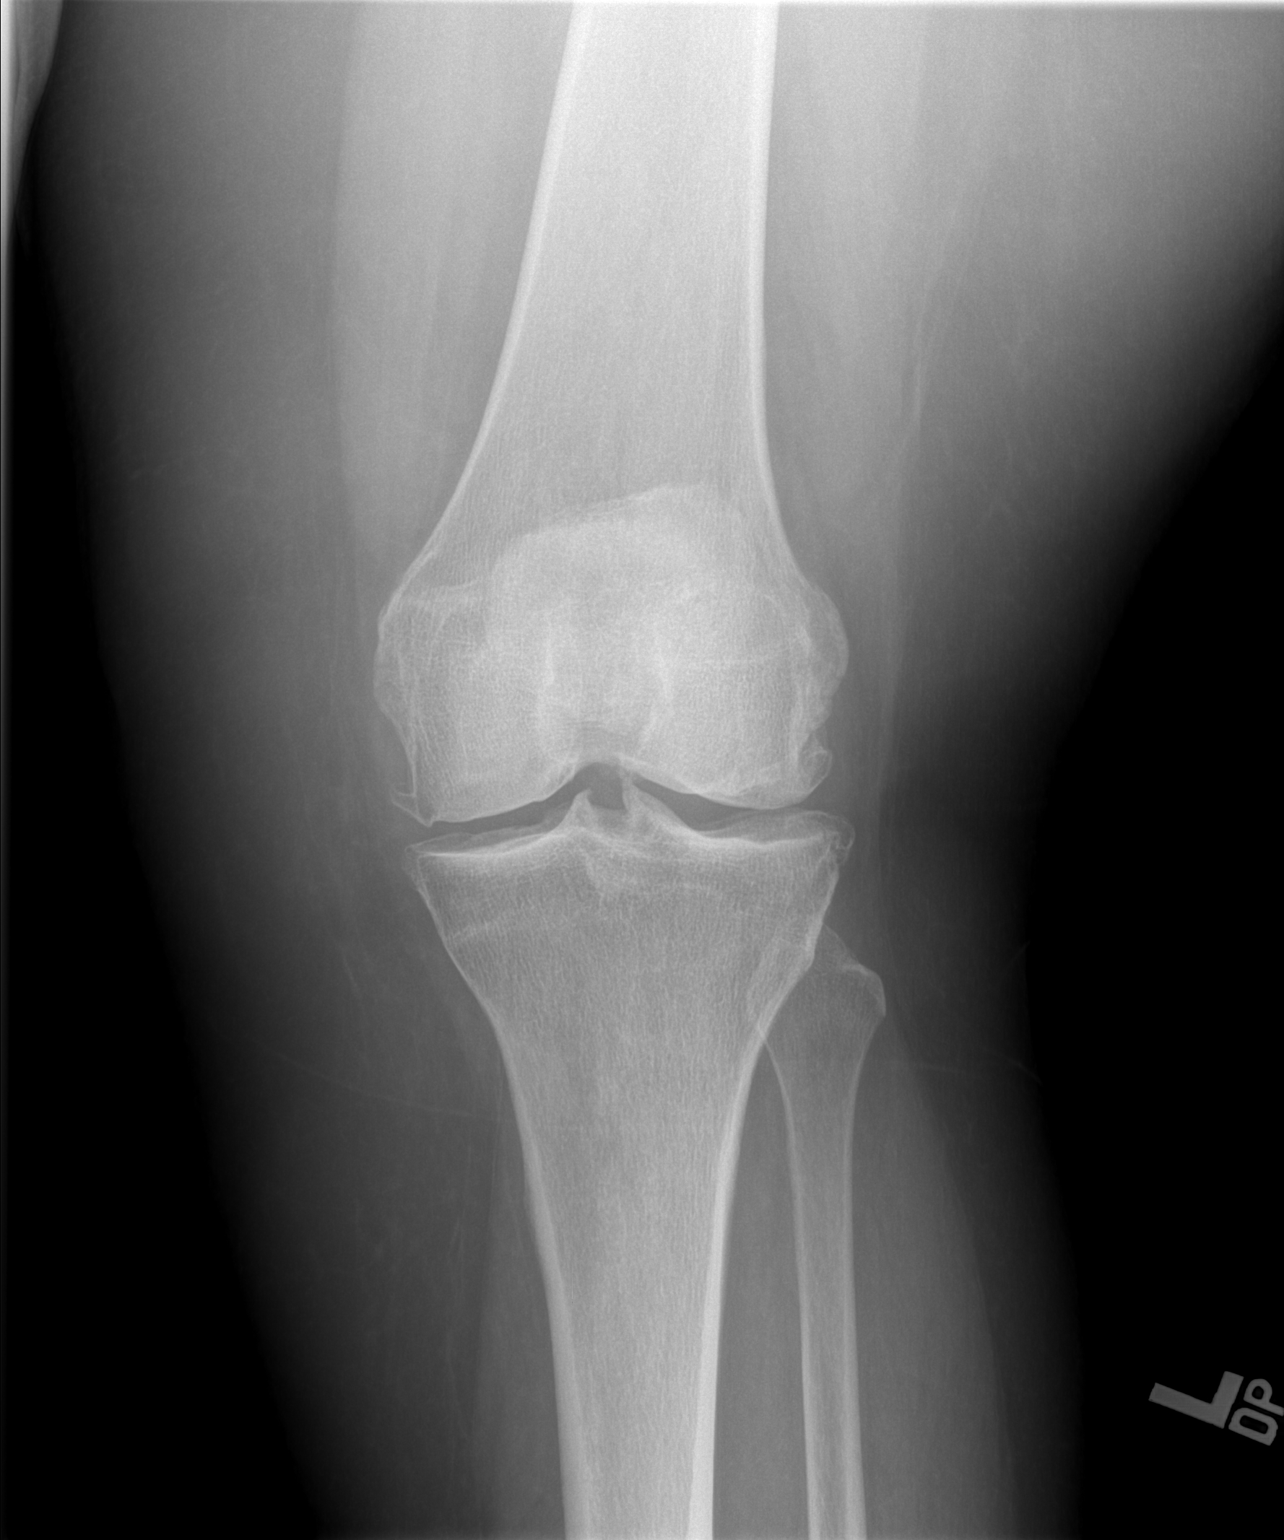

[t knee lat left]
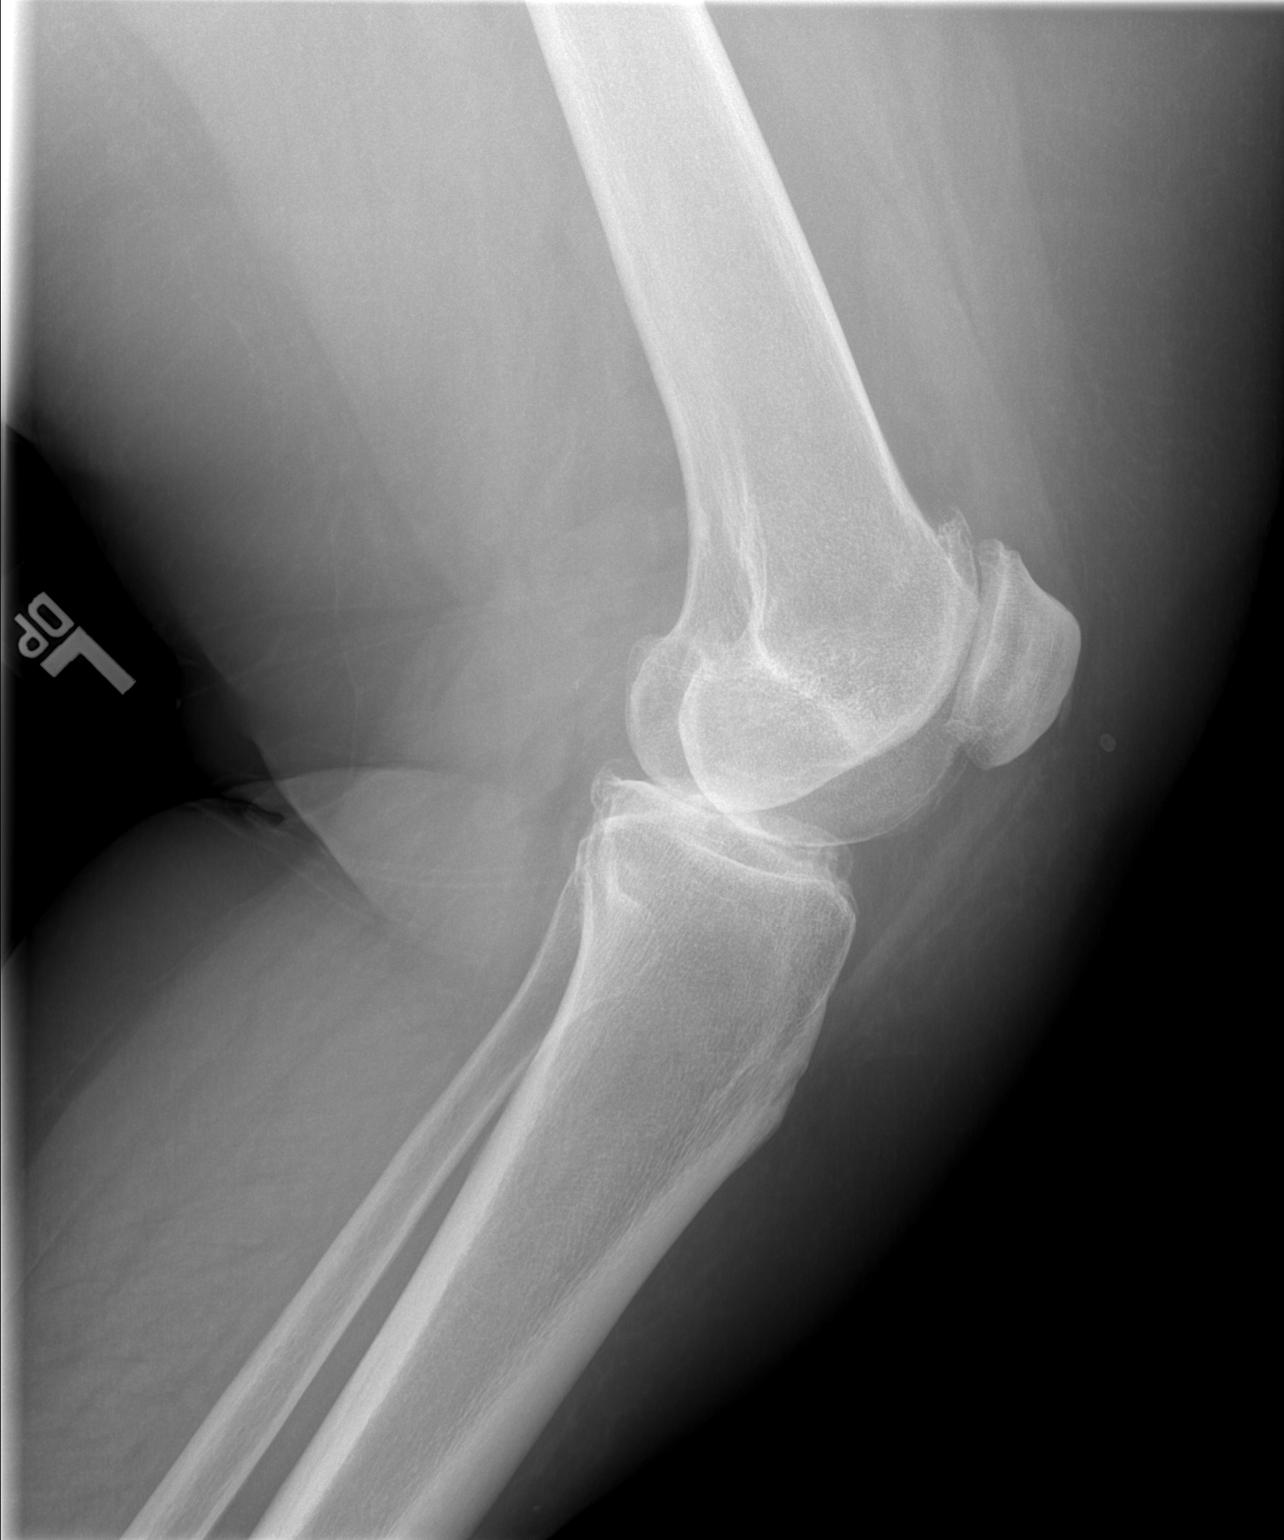

[2 of 2 positions shown; findings below may reference images not displayed]

FINDINGS: Mild to moderate tricompartmental degenerative changes,
most prominent in the patellofemoral compartment.

No fracture or dislocation is seen.

No suprapatellar knee joint effusion.
IMPRESSION: Mild to moderate tricompartmental degenerative changes, most
prominent in the patellofemoral compartment.

## 2013-09-09 IMAGING — CR DG HIP COMPLETE 2+V*R*
2 series · 2 of 2 positions shown · non-contrast
Comparison: None.

CLINICAL DATA: Right hip pain, no trauma

RIGHT HIP - COMPLETE 2+ VIEW

[t hip ap right]
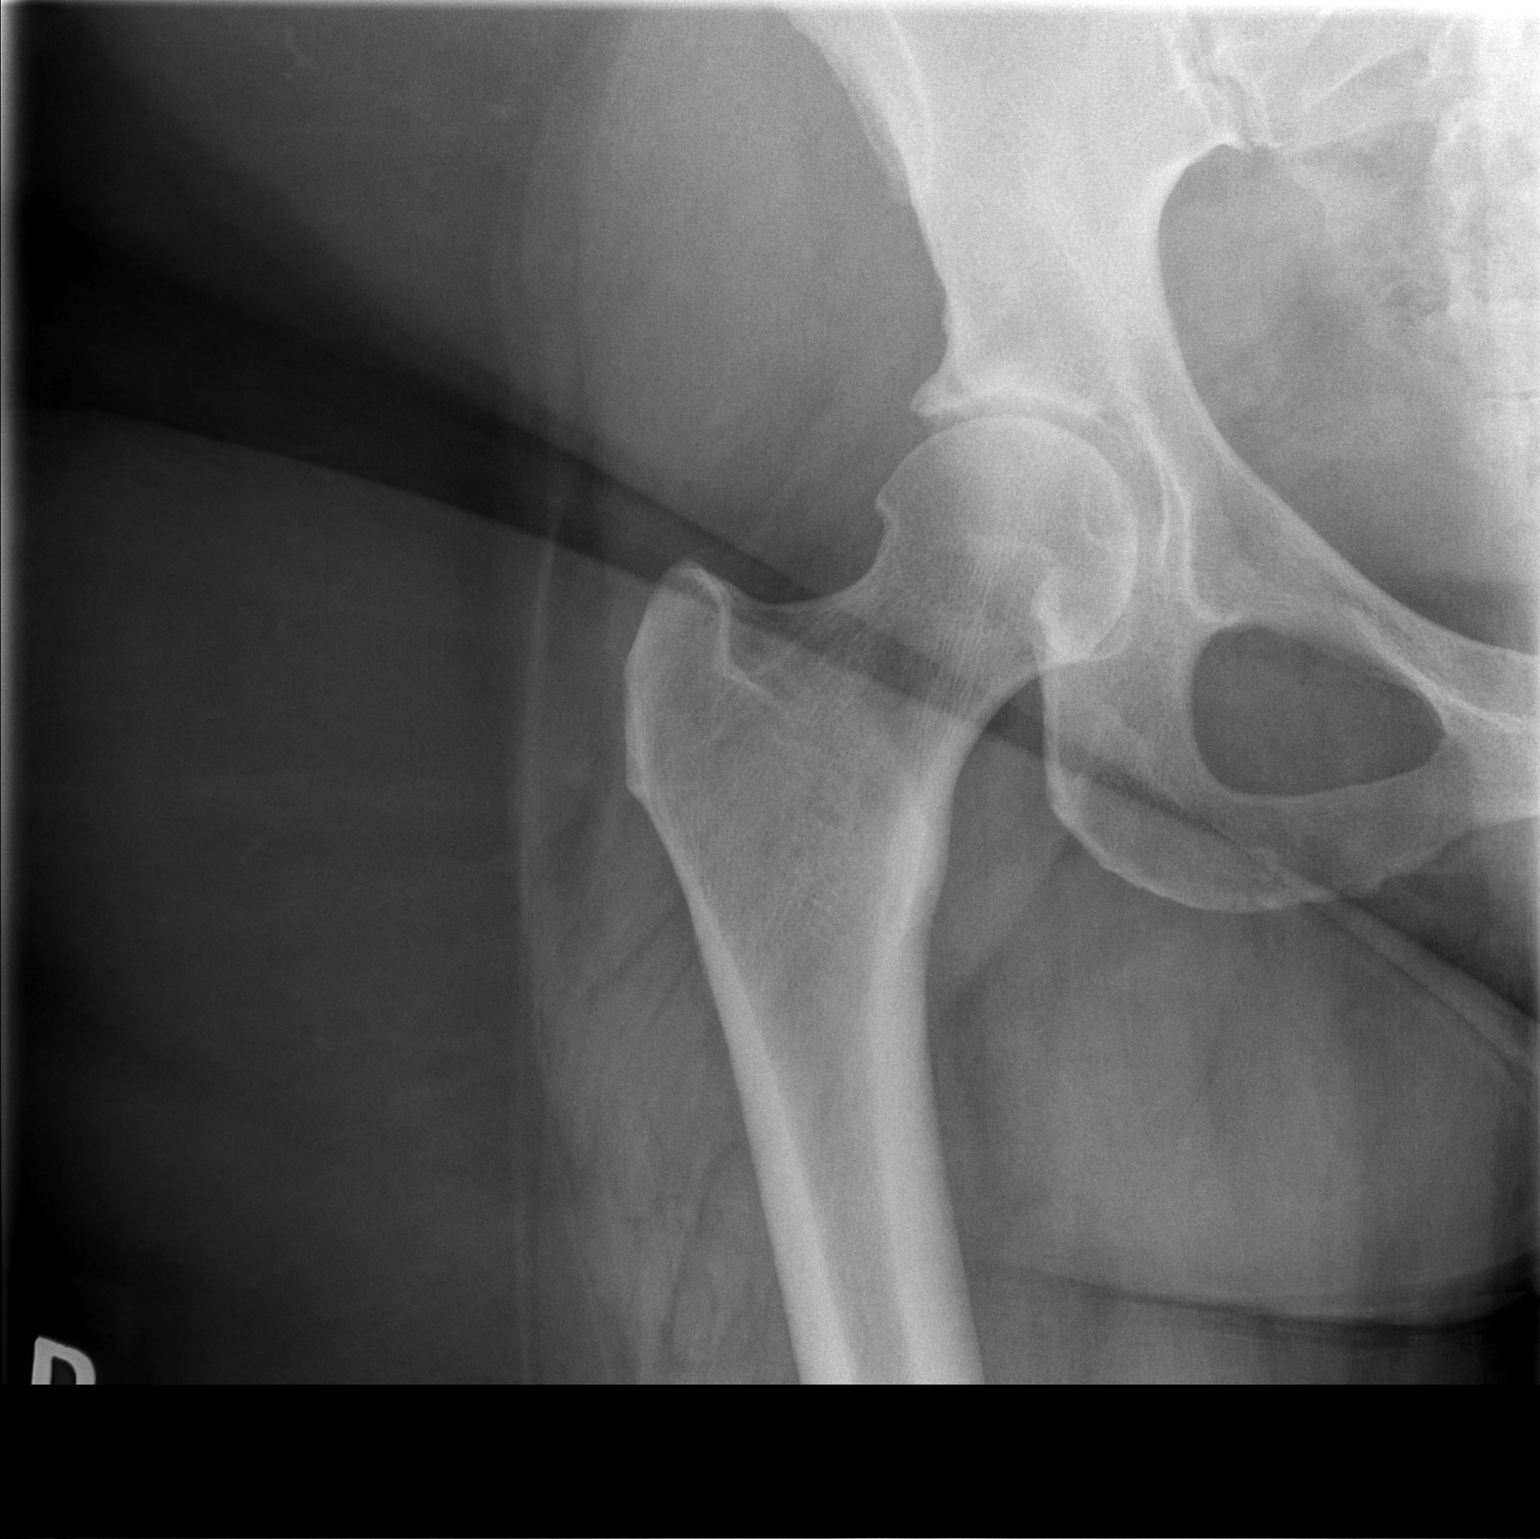

[t hip frog leg right]
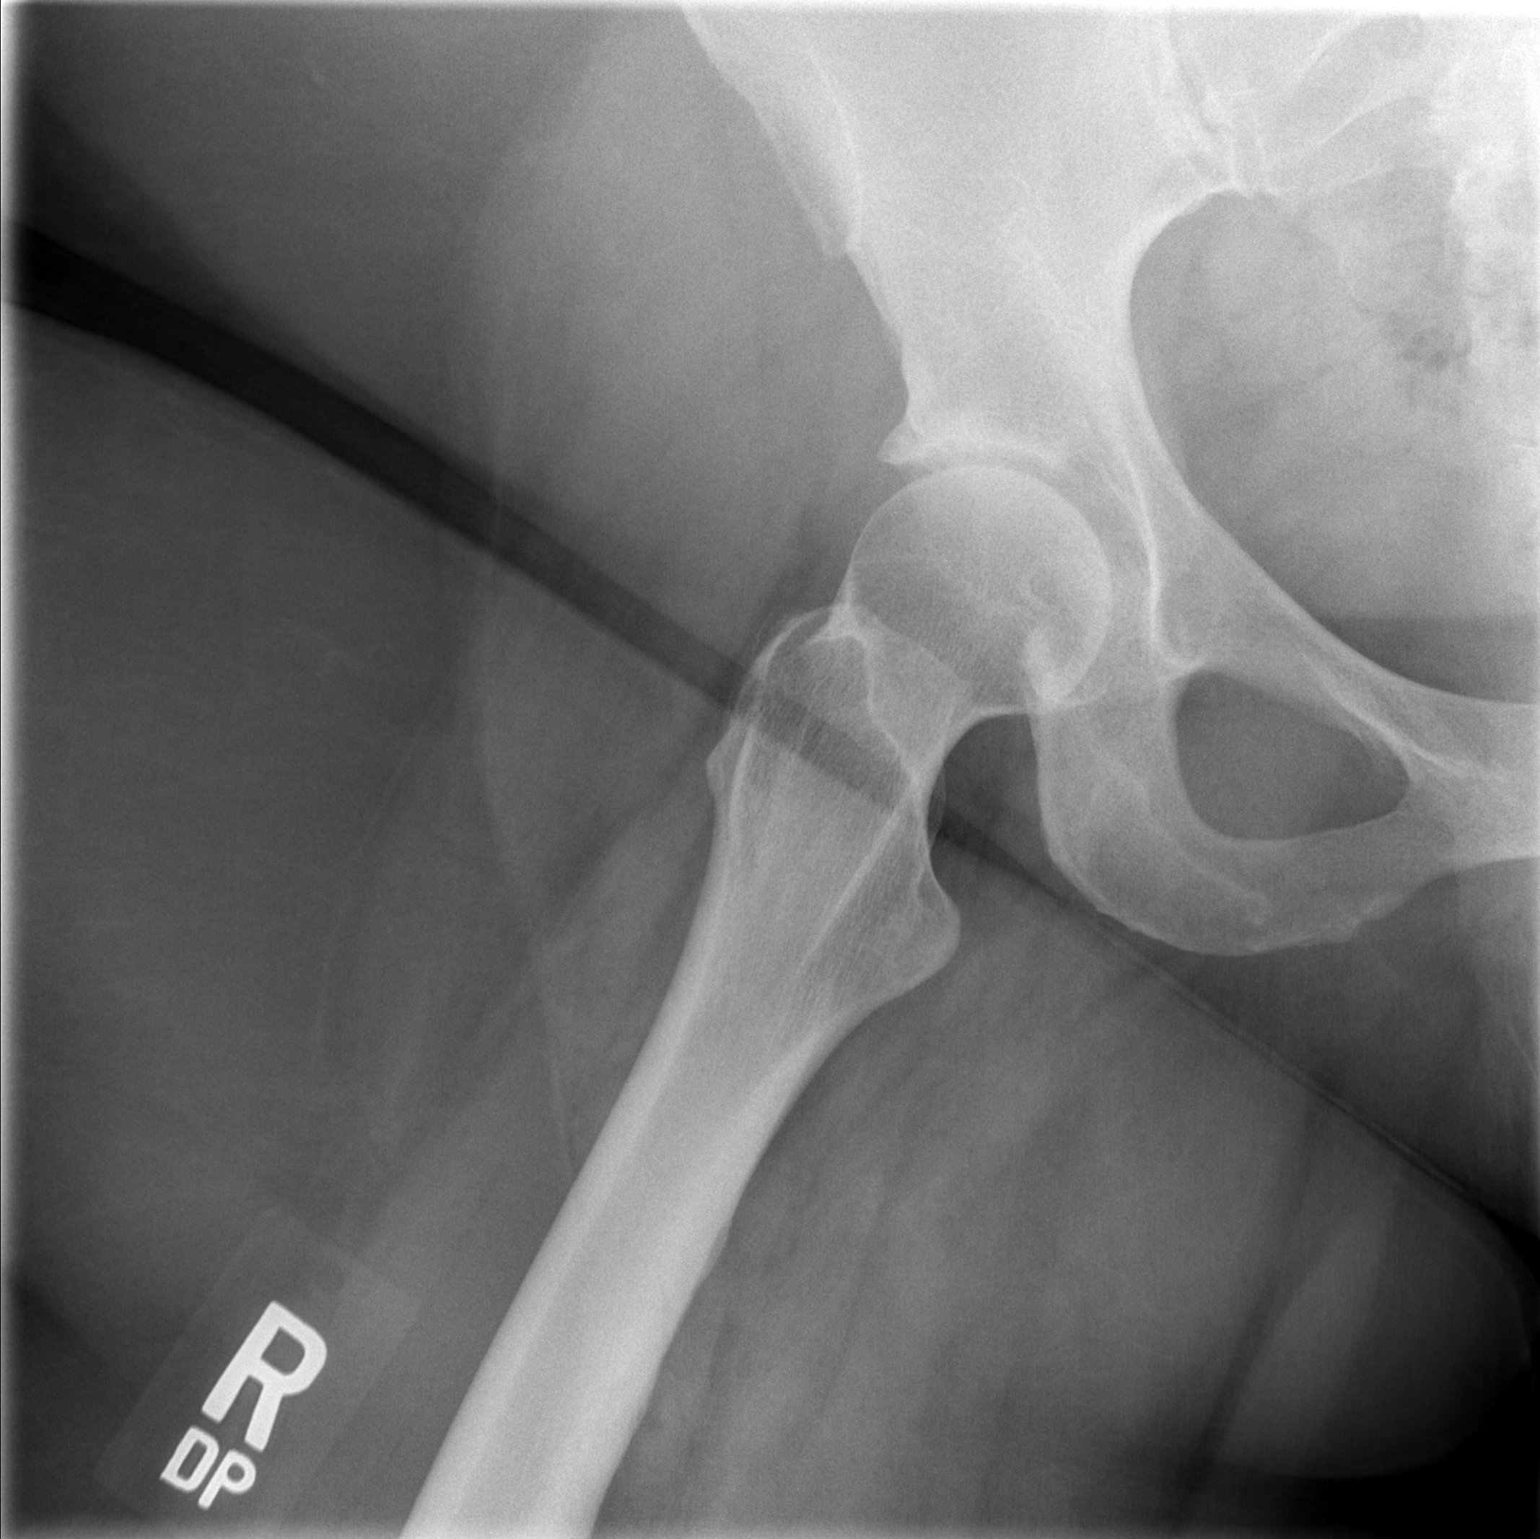

[2 of 2 positions shown; findings below may reference images not displayed]

FINDINGS: No fracture or dislocation is seen.

The right hip joint space is preserved.

The visualized bony pelvis appears intact.
IMPRESSION: No fracture or dislocation is seen.

## 2013-09-10 ENCOUNTER — Emergency Department (HOSPITAL_COMMUNITY): Payer: Medicare Other

## 2013-09-10 ENCOUNTER — Emergency Department (HOSPITAL_COMMUNITY)
Admission: EM | Admit: 2013-09-10 | Discharge: 2013-09-10 | Disposition: A | Discharge status: Home / Self Care | Payer: Medicare Other | Attending: Emergency Medicine | Admitting: Emergency Medicine

## 2013-09-10 ENCOUNTER — Encounter (HOSPITAL_COMMUNITY): Payer: Self-pay | Admitting: Emergency Medicine

## 2013-09-10 DIAGNOSIS — M1990 Unspecified osteoarthritis, unspecified site: Secondary | ICD-10-CM

## 2013-09-10 DIAGNOSIS — Z792 Long term (current) use of antibiotics: Secondary | ICD-10-CM | POA: Insufficient documentation

## 2013-09-10 DIAGNOSIS — Z79899 Other long term (current) drug therapy: Secondary | ICD-10-CM | POA: Insufficient documentation

## 2013-09-10 DIAGNOSIS — M1610 Unilateral primary osteoarthritis, unspecified hip: Secondary | ICD-10-CM | POA: Insufficient documentation

## 2013-09-10 DIAGNOSIS — I10 Essential (primary) hypertension: Secondary | ICD-10-CM | POA: Insufficient documentation

## 2013-09-10 DIAGNOSIS — E669 Obesity, unspecified: Secondary | ICD-10-CM | POA: Insufficient documentation

## 2013-09-10 DIAGNOSIS — IMO0002 Reserved for concepts with insufficient information to code with codable children: Secondary | ICD-10-CM | POA: Insufficient documentation

## 2013-09-10 DIAGNOSIS — Z91040 Latex allergy status: Secondary | ICD-10-CM | POA: Insufficient documentation

## 2013-09-10 DIAGNOSIS — M7150 Other bursitis, not elsewhere classified, unspecified site: Secondary | ICD-10-CM | POA: Insufficient documentation

## 2013-09-10 DIAGNOSIS — Z862 Personal history of diseases of the blood and blood-forming organs and certain disorders involving the immune mechanism: Secondary | ICD-10-CM | POA: Insufficient documentation

## 2013-09-10 DIAGNOSIS — J45909 Unspecified asthma, uncomplicated: Secondary | ICD-10-CM | POA: Insufficient documentation

## 2013-09-10 DIAGNOSIS — N390 Urinary tract infection, site not specified: Secondary | ICD-10-CM | POA: Insufficient documentation

## 2013-09-10 DIAGNOSIS — M719 Bursopathy, unspecified: Secondary | ICD-10-CM

## 2013-09-10 DIAGNOSIS — Z8719 Personal history of other diseases of the digestive system: Secondary | ICD-10-CM | POA: Insufficient documentation

## 2013-09-10 DIAGNOSIS — M1710 Unilateral primary osteoarthritis, unspecified knee: Secondary | ICD-10-CM | POA: Insufficient documentation

## 2013-09-10 DIAGNOSIS — Z87891 Personal history of nicotine dependence: Secondary | ICD-10-CM | POA: Insufficient documentation

## 2013-09-10 LAB — URINE MICROSCOPIC-ADD ON

## 2013-09-10 LAB — URINALYSIS, ROUTINE W REFLEX MICROSCOPIC
Glucose, UA: NEGATIVE mg/dL
Ketones, ur: NEGATIVE mg/dL
Nitrite: POSITIVE — AB
Protein, ur: 100 mg/dL — AB
Specific Gravity, Urine: 1.028 (ref 1.005–1.030)
Urobilinogen, UA: 0.2 mg/dL (ref 0.0–1.0)
pH: 5 (ref 5.0–8.0)

## 2013-09-10 MED ORDER — NITROFURANTOIN MONOHYD MACRO 100 MG PO CAPS
100.0000 mg | ORAL_CAPSULE | Freq: Once | ORAL | Status: AC
  Administered 2013-09-10: 100 mg via ORAL
  Filled 2013-09-10: qty 1

## 2013-09-10 MED ORDER — HYDROCODONE-ACETAMINOPHEN 5-325 MG PO TABS: 2.0000 | ORAL_TABLET | ORAL | Status: DC | PRN

## 2013-09-10 MED ORDER — HYDROCODONE-ACETAMINOPHEN 5-325 MG PO TABS
2.0000 | ORAL_TABLET | Freq: Once | ORAL | Status: AC
Start: 2013-09-10 — End: 2013-09-10
  Administered 2013-09-10: 2 via ORAL
  Filled 2013-09-10: qty 2

## 2013-09-10 MED ORDER — NITROFURANTOIN MONOHYD MACRO 100 MG PO CAPS: 100.0000 mg | ORAL_CAPSULE | Freq: Two times a day (BID) | ORAL | Status: DC

## 2013-09-10 MED ORDER — METHYLPREDNISOLONE 4 MG PO KIT: PACK | ORAL | Status: DC

## 2013-09-10 NOTE — ED Notes (Signed)
Pt to radiology.

## 2013-09-10 NOTE — ED Notes (Signed)
Pt states she has dysuria with cloudy urine. Pt states she also fell week and a half ago. Pt states she feels "hip slipping" and pain with movement. Pt able to ambulate to room.

## 2013-09-10 NOTE — ED Provider Notes (Addendum)
CSN: [id number]     Arrival date & time 09/10/13  1824 History   First MD Initiated Contact with Patient 09/10/13 1856     Chief Complaint  Patient presents with  . Dysuria  . Hip Pain      HPI  Pt c/o a fall onto her hip several weeks ago.  Had pain in right hip prior to fall.  Also c/o urinary frequency and burning for last 3-4 days.  No fever, flank pian, or vomiting.  Past Medical History  Diagnosis Date  . Hypertension   . Hyperlipidemia   . Asthma   . Obesity   . Lower extremity edema     Chronic. 2D echo (2005) - EF 65%.  . Seasonal allergies   . Abnormal EKG 06/2003    History of inverted T waves V1-V3. Normal 2D echo (07/23/2003): LVEF 65%.  . History of multiple pulmonary nodules     Incidental finding: CT Abd/ Pelvis (04/2010) - Several small lower lobe lung nodules, including one pure ground-glass pulmonary nodule measuring 8 mm in the left lower lobe.  Recommend follow-up chest CT (IV contrast preferred) in 6 months to document stability. //  CT Abd/ Pelvis (07/2010) -  3 mm RLL and  8 mm LLL nodule stable.  Other nodules unchanged, likely benign.  . 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 Followed by Sports Medicine, Dr. Jennette KettleNeal.  . Incarcerated ventral hernia 04/2010    Noted on CT Abd/ pelvis (04/2010). Patient now s/p ventral hernia repair by Dr. Gerrit FriendsGerkin (12/2010)  . Anemia     BL Hgb 11-12. Ferritin 14 - low normal (08/2007). Colonoscopy 2009 - external hemorrhoids (excellent prep). Last anemia panel (12/2010) - Iron  24, TIBC 269,  B12  316, Folate 11.9, Ferritin 73.  . Wears dentures   . Wears glasses   . Knee osteoarthritis     s/p Left total knee replacement (06/2011)   Past Surgical History  Procedure Laterality Date  . Incision and drainage abscess anal  07/2008    I&D and debridgement of anorectal abscess  . Inguinal hernia repair    . Tubal ligation    . Incisional hernia repair  12/2010    Repair of incarcerated ventral  incisional hernia with Ethicon mesh patch - performed by Dr. Gerrit FriendsGerkin.   . Foot surgery  2004     left  . Total knee arthroplasty  06/16/2011    Left TOTAL KNEE ARTHROPLASTY;  Surgeon: Kennieth RadArthur F Carter;  Location: [facility] OR;  Service: Orthopedics;  Laterality: Left;  left total knee arthroplasty   Family History  Problem Relation Age of Onset  . Diabetes Mother   . Hypertension Mother   . Stroke Mother   . Heart attack Neg Hx   . Dementia Father   . Rashes / Skin problems Maternal Grandfather    History  Substance Use Topics  . Smoking status: Former Smoker -- 0.50 packs/day for 20 years    Types: Cigarettes    Quit date: 09/10/1998  . Smokeless tobacco: Not on file  . Alcohol Use: No   OB History   Grav Para Term Preterm Abortions TAB SAB Ect Mult Living                 Review of Systems  Constitutional: Negative for fever, chills, diaphoresis, appetite change and fatigue.  HENT: Negative for mouth sores, sore throat and trouble swallowing.   Eyes: Negative for visual disturbance.  Respiratory: Negative for cough, chest tightness, shortness of breath and wheezing.   Cardiovascular: Negative for chest pain.  Gastrointestinal: Negative for nausea, vomiting, abdominal pain, diarrhea and abdominal distention.  Endocrine: Negative for polydipsia, polyphagia and polyuria.  Genitourinary: Positive for dysuria and frequency. Negative for hematuria.  Musculoskeletal: Positive for arthralgias. Negative for gait problem.       Hip pain  Skin: Negative for color change, pallor and rash.  Neurological: Negative for dizziness, syncope, light-headedness and headaches.  Hematological: Does not bruise/bleed easily.  Psychiatric/Behavioral: Negative for behavioral problems and confusion.      Allergies  Ketorolac tromethamine; Atrovent; and Latex  Home Medications   Current Outpatient Rx  Name  Route  Sig  Dispense  Refill  . albuterol (PROVENTIL HFA;VENTOLIN HFA) 108 (90 BASE) MCG/ACT  inhaler   Inhalation   Inhale 2 puffs into the lungs every 4 (four) hours as needed for wheezing or shortness of breath.   1 Inhaler   2   . amLODipine (NORVASC) 10 MG tablet   Oral   Take 1 tablet (10 mg total) by mouth daily.   30 tablet   11   . HYDROcodone-acetaminophen (NORCO) 10-325 MG per tablet   Oral   Take 1 tablet by mouth every 8 (eight) hours as needed (pain.).         [name] lisinopril-hydrochlorothiazide (ZESTORETIC) 20-12.5 MG per tablet   Oral   Take 2 tablets by mouth daily.   60 tablet   11   . pantoprazole (PROTONIX) 20 MG tablet   Oral   Take 20 mg by mouth daily as needed for heartburn or indigestion.         . traMADol (ULTRAM) 50 MG tablet   Oral   Take 50 mg by mouth every 6 (six) hours as needed (pain.).         [name] HYDROcodone-acetaminophen (NORCO/VICODIN) 5-325 MG per tablet   Oral   Take 2 tablets by mouth every 4 (four) hours as needed.   10 tablet   0   . methylPREDNISolone (MEDROL DOSEPAK) 4 MG tablet      6 po on day 1, then decrease by 1 per day   21 tablet   0   . nitrofurantoin, macrocrystal-monohydrate, (MACROBID) 100 MG capsule   Oral   Take 1 capsule (100 mg total) by mouth 2 (two) times daily.   10 capsule   0    BP 126/62  Pulse 85  Temp(Src) 98.5 F (36.9 C) (Oral)  Resp 18  SpO2 100%  LMP 09/09/2009 Physical Exam  Constitutional: She is oriented to person, place, and time. She appears well-developed and well-nourished. No distress.  HENT:  Head: Normocephalic.  Eyes: Conjunctivae are normal. Pupils are equal, round, and reactive to light. No scleral icterus.  Neck: Normal range of motion. Neck supple. No thyromegaly present.  Cardiovascular: Normal rate and regular rhythm.  Exam reveals no gallop and no friction rub.   No murmur heard. Pulmonary/Chest: Effort normal and breath sounds normal. No respiratory distress. She has no wheezes. She has no rales.  Abdominal: Soft. Bowel sounds are normal. She exhibits no  distension. There is no tenderness. There is no rebound.  Musculoskeletal: Normal range of motion.       Legs: TTP over Greater trochanter. No shortening, no rotation.  Pt can bear weight.  Neurological: She is alert and oriented to person, place, and time.  Skin: Skin is warm and dry. No rash noted.  Psychiatric:  She has a normal mood and affect. Her behavior is normal.    ED Course  Procedures (including critical care time) Labs Review Labs Reviewed  URINALYSIS, ROUTINE W REFLEX MICROSCOPIC - Abnormal; Notable for the following:    APPearance TURBID (*)    Hgb urine dipstick LARGE (*)    Bilirubin Urine SMALL (*)    Protein, ur 100 (*)    Nitrite POSITIVE (*)    Leukocytes, UA LARGE (*)    All other components within normal limits  URINE MICROSCOPIC-ADD ON   Imaging Review Dg Hip Complete Left  09/10/2013   CLINICAL DATA:  Left hip pain.  EXAM: LEFT HIP - COMPLETE 2+ VIEW  COMPARISON:  08/01/2011  FINDINGS: Both hips are normally located. Moderate degenerative changes bilaterally with joint space narrowing and osteophytic spurring. No acute fracture or plain film evidence of avascular necrosis. The pubic symphysis and SI joints are intact. No pelvic fractures.  IMPRESSION: Bilateral hip joint degenerative changes but no acute findings.   Electronically Signed   By: Loralie Champagne M.D.   On: 09/10/2013 19:15     EKG Interpretation None      MDM   Final diagnoses:  UTI (lower urinary tract infection)  Arthritis  Bursitis    X-rays show arthritic changes, but no acute abnormalities. Urine appears infected. For Vicodin. Given Macrobid. Family Medrol Dosepak. She has pain directly over the trochanteric bursa which she states preceded her fall by several weeks.    Rolland Porter, MD 09/10/13 [id number]  Rolland Porter, MD 09/21/13 [phone]

## 2013-09-10 NOTE — Discharge Instructions (Signed)
Arthritis, Nonspecific °Arthritis is inflammation of a joint. This usually means pain, redness, warmth or swelling are present. One or more joints may be involved. There are a number of types of arthritis. Your caregiver may not be able to tell what type of arthritis you have right away. °CAUSES  °The most common cause of arthritis is the wear and tear on the joint (osteoarthritis). This causes damage to the cartilage, which can break down over time. The knees, hips, back and neck are most often affected by this type of arthritis. °Other types of arthritis and common causes of joint pain include: °· Sprains and other injuries near the joint. Sometimes minor sprains and injuries cause pain and swelling that develop hours later. °· Rheumatoid arthritis. This affects hands, feet and knees. It usually affects both sides of your body at the same time. It is often associated with chronic ailments, fever, weight loss and general weakness. °· Crystal arthritis. Gout and pseudo gout can cause occasional acute severe pain, redness and swelling in the foot, ankle, or knee. °· Infectious arthritis. Bacteria can get into a joint through a break in overlying skin. This can cause infection of the joint. Bacteria and viruses can also spread through the blood and affect your joints. °· Drug, infectious and allergy reactions. Sometimes joints can become mildly painful and slightly swollen with these types of illnesses. °SYMPTOMS  °· Pain is the main symptom. °· Your joint or joints can also be red, swollen and warm or hot to the touch. °· You may have a fever with certain types of arthritis, or even feel overall ill. °· The joint with arthritis will hurt with movement. Stiffness is present with some types of arthritis. °DIAGNOSIS  °Your caregiver will suspect arthritis based on your description of your symptoms and on your exam. Testing may be needed to find the type of arthritis: °· Blood and sometimes urine tests. °· X-ray tests  and sometimes CT or MRI scans. °· Removal of fluid from the joint (arthrocentesis) is done to check for bacteria, crystals or other causes. Your caregiver (or a specialist) will numb the area over the joint with a local anesthetic, and use a needle to remove joint fluid for examination. This procedure is only minimally uncomfortable. °· Even with these tests, your caregiver may not be able to tell what kind of arthritis you have. Consultation with a specialist (rheumatologist) may be helpful. °TREATMENT  °Your caregiver will discuss with you treatment specific to your type of arthritis. If the specific type cannot be determined, then the following general recommendations may apply. °Treatment of severe joint pain includes: °· Rest. °· Elevation. °· Anti-inflammatory medication (for example, ibuprofen) may be prescribed. Avoiding activities that cause increased pain. °· Only take over-the-counter or prescription medicines for pain and discomfort as recommended by your caregiver. °· Cold packs over an inflamed joint may be used for 10 to 15 minutes every hour. Hot packs sometimes feel better, but do not use overnight. Do not use hot packs if you are diabetic without your caregiver's permission. °· A cortisone shot into arthritic joints may help reduce pain and swelling. °· Any acute arthritis that gets worse over the next 1 to 2 days needs to be looked at to be sure there is no joint infection. °Long-term arthritis treatment involves modifying activities and lifestyle to reduce joint stress jarring. This can include weight loss. Also, exercise is needed to nourish the joint cartilage and remove waste. This helps keep the muscles   around the joint strong. HOME CARE INSTRUCTIONS   Do not take aspirin to relieve pain if gout is suspected. This elevates uric acid levels.  Only take over-the-counter or prescription medicines for pain, discomfort or fever as directed by your caregiver.  Rest the joint as much as  possible.  If your joint is swollen, keep it elevated.  Use crutches if the painful joint is in your leg.  Drinking plenty of fluids may help for certain types of arthritis.  Follow your caregiver's dietary instructions.  Try low-impact exercise such as:  Swimming.  Water aerobics.  Biking.  Walking.  Morning stiffness is often relieved by a warm shower.  Put your joints through regular range-of-motion. SEEK MEDICAL CARE IF:   You do not feel better in 24 hours or are getting worse.  You have side effects to medications, or are not getting better with treatment. SEEK IMMEDIATE MEDICAL CARE IF:   You have a fever.  You develop severe joint pain, swelling or redness.  Many joints are involved and become painful and swollen.  There is severe back pain and/or leg weakness.  You have loss of bowel or bladder control. Document Released: 08/04/2004 Document Revised: 09/19/2011 Document Reviewed: 08/20/2008 [facility] Patient Information 2014 [place], [place].  Urinary Tract Infection A urinary tract infection (UTI) can occur any place along the urinary tract. The tract includes the kidneys, ureters, bladder, and urethra. A type of germ called bacteria often causes a UTI. UTIs are often helped with antibiotic medicine.  HOME CARE   If given, take antibiotics as told by your doctor. Finish them even if you start to feel better.  Drink enough fluids to keep your pee (urine) clear or pale yellow.  Avoid tea, drinks with caffeine, and bubbly (carbonated) drinks.  Pee often. Avoid holding your pee in for a long time.  Pee before and after having sex (intercourse).  Wipe from front to back after you poop (bowel movement) if you are a woman. Use each tissue only once. GET HELP RIGHT AWAY IF:   You have back pain.  You have lower belly (abdominal) pain.  You have chills.  You feel sick to your stomach (nauseous).  You throw up (vomit).  Your burning or discomfort  with peeing does not go away.  You have a fever.  Your symptoms are not better in 3 days. MAKE SURE YOU:   Understand these instructions.  Will watch your condition.  Will get help right away if you are not doing well or get worse. Document Released: 12/14/2007 Document Revised: 03/21/2012 Document Reviewed: 01/26/2012 [facility] Patient Information 2014 [place], [place].

## 2013-09-10 NOTE — ED Notes (Signed)
Initial Contact - pt to RM6, reports "i think i have a urine infection", c/o 7/10 groin pain.  Pt also reports c/o pain to L hip s/p fall last week.  Pt able to ambulate independently.  +csm/+pulses.  Difficult to assess site secondary to body habitus.  Pt denies fevers/chills, n/v/d/c.  Skin PWD.  Speaking full/clear sentences.  NAD.

## 2013-10-29 ENCOUNTER — Emergency Department (HOSPITAL_COMMUNITY)
Admission: EM | Admit: 2013-10-29 | Discharge: 2013-10-29 | Disposition: A | Discharge status: Home / Self Care | Payer: Medicare Other | Attending: Emergency Medicine | Admitting: Emergency Medicine

## 2013-10-29 ENCOUNTER — Encounter (HOSPITAL_COMMUNITY): Payer: Self-pay | Admitting: Emergency Medicine

## 2013-10-29 ENCOUNTER — Emergency Department (HOSPITAL_COMMUNITY): Payer: Medicare Other

## 2013-10-29 DIAGNOSIS — M25559 Pain in unspecified hip: Secondary | ICD-10-CM | POA: Insufficient documentation

## 2013-10-29 DIAGNOSIS — M545 Low back pain, unspecified: Secondary | ICD-10-CM | POA: Insufficient documentation

## 2013-10-29 DIAGNOSIS — Z8719 Personal history of other diseases of the digestive system: Secondary | ICD-10-CM | POA: Insufficient documentation

## 2013-10-29 DIAGNOSIS — Z91040 Latex allergy status: Secondary | ICD-10-CM | POA: Insufficient documentation

## 2013-10-29 DIAGNOSIS — M25552 Pain in left hip: Secondary | ICD-10-CM

## 2013-10-29 DIAGNOSIS — IMO0002 Reserved for concepts with insufficient information to code with codable children: Secondary | ICD-10-CM | POA: Insufficient documentation

## 2013-10-29 DIAGNOSIS — Z792 Long term (current) use of antibiotics: Secondary | ICD-10-CM | POA: Insufficient documentation

## 2013-10-29 DIAGNOSIS — J45909 Unspecified asthma, uncomplicated: Secondary | ICD-10-CM | POA: Insufficient documentation

## 2013-10-29 DIAGNOSIS — I10 Essential (primary) hypertension: Secondary | ICD-10-CM | POA: Insufficient documentation

## 2013-10-29 DIAGNOSIS — Z87891 Personal history of nicotine dependence: Secondary | ICD-10-CM | POA: Insufficient documentation

## 2013-10-29 DIAGNOSIS — Z862 Personal history of diseases of the blood and blood-forming organs and certain disorders involving the immune mechanism: Secondary | ICD-10-CM | POA: Insufficient documentation

## 2013-10-29 DIAGNOSIS — M1710 Unilateral primary osteoarthritis, unspecified knee: Secondary | ICD-10-CM | POA: Insufficient documentation

## 2013-10-29 DIAGNOSIS — M533 Sacrococcygeal disorders, not elsewhere classified: Secondary | ICD-10-CM | POA: Insufficient documentation

## 2013-10-29 DIAGNOSIS — Z79899 Other long term (current) drug therapy: Secondary | ICD-10-CM | POA: Insufficient documentation

## 2013-10-29 DIAGNOSIS — E669 Obesity, unspecified: Secondary | ICD-10-CM | POA: Insufficient documentation

## 2013-10-29 MED ORDER — NAPROXEN 500 MG PO TABS: 500.0000 mg | ORAL_TABLET | Freq: Two times a day (BID) | ORAL | Status: DC

## 2013-10-29 MED ORDER — NAPROXEN 250 MG PO TABS
500.0000 mg | ORAL_TABLET | Freq: Once | ORAL | Status: AC
  Administered 2013-10-29: 500 mg via ORAL
  Filled 2013-10-29: qty 2

## 2013-10-29 NOTE — ED Notes (Signed)
Pt reports left hip pain x 3 days. States bent over and had sharp pain to left hip. Worse when sitting.

## 2013-10-29 NOTE — Discharge Instructions (Signed)
Follow up with your doctor for further evaluation of your hip pain. Refer to attached documents for more information. Return to the ED with worsening or concerning symptoms.

## 2013-10-29 NOTE — ED Provider Notes (Signed)
Medical screening examination/treatment/procedure(s) were performed by non-physician practitioner and as supervising physician I was immediately available for consultation/collaboration.   EKG Interpretation None        Kristen N Ward, DO 10/29/13 1550 

## 2013-10-29 NOTE — ED Provider Notes (Signed)
CSN: [id number]     Arrival date & time 10/29/13  [id number] History  This chart was scribed for non-physician practitioner Emilia BeckKaitlyn Ryleah Miramontes, PA-C working with Layla MawKristen N Ward, DO by Leone PayorSonum Patel, ED Scribe. This patient was seen in room TR10C/TR10C and the patient's care was started at 10:56 AM.    Chief Complaint  Patient presents with  . Hip Pain      The history is provided by the patient. No language interpreter was used.    HPI Comments: [name] is a 58 y.o. female who presents to the Emergency Department complaining of sudden onset, constant, unchanged left lower back and buttock pain that began 3 days ago. She states the pain began after bending down to reach for something. She describes the pain as a pulling sensation. She states the pain is worse with movements. She has tried hydrocodone 10-325 and muscle relaxants for the past few days without relief.   Past Medical History  Diagnosis Date  . Hypertension   . Hyperlipidemia   . Asthma   . Obesity   . Lower extremity edema     Chronic. 2D echo (2005) - EF 65%.  . Seasonal allergies   . Abnormal EKG 06/2003    History of inverted T waves V1-V3. Normal 2D echo (07/23/2003): LVEF 65%.  . History of multiple pulmonary nodules     Incidental finding: CT Abd/ Pelvis (04/2010) - Several small lower lobe lung nodules, including one pure ground-glass pulmonary nodule measuring 8 mm in the left lower lobe.  Recommend follow-up chest CT (IV contrast preferred) in 6 months to document stability. //  CT Abd/ Pelvis (07/2010) -  3 mm RLL and  8 mm LLL nodule stable.  Other nodules unchanged, likely benign.  . 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 Followed by Sports Medicine, Dr. Jennette KettleNeal.  . Incarcerated ventral hernia 04/2010    Noted on CT Abd/ pelvis (04/2010). Patient now s/p ventral hernia repair by Dr. Gerrit FriendsGerkin (12/2010)  . Anemia     BL Hgb 11-12. Ferritin 14 - low normal (08/2007). Colonoscopy 2009 - external  hemorrhoids (excellent prep). Last anemia panel (12/2010) - Iron  24, TIBC 269,  B12  316, Folate 11.9, Ferritin 73.  . Wears dentures   . Wears glasses   . Knee osteoarthritis     s/p Left total knee replacement (06/2011)   Past Surgical History  Procedure Laterality Date  . Incision and drainage abscess anal  07/2008    I&D and debridgement of anorectal abscess  . Inguinal hernia repair    . Tubal ligation    . Incisional hernia repair  12/2010    Repair of incarcerated ventral incisional hernia with Ethicon mesh patch - performed by Dr. Gerrit FriendsGerkin.   . Foot surgery  2004     left  . Total knee arthroplasty  06/16/2011    Left TOTAL KNEE ARTHROPLASTY;  Surgeon: Kennieth RadArthur F Carter;  Location: [facility] OR;  Service: Orthopedics;  Laterality: Left;  left total knee arthroplasty   Family History  Problem Relation Age of Onset  . Diabetes Mother   . Hypertension Mother   . Stroke Mother   . Heart attack Neg Hx   . Dementia Father   . Rashes / Skin problems Maternal Grandfather    History  Substance Use Topics  . Smoking status: Former Smoker -- 0.50 packs/day for 20 years    Types: Cigarettes    Quit date: 09/10/1998  .  Smokeless tobacco: Not on file  . Alcohol Use: No   OB History   Grav Para Term Preterm Abortions TAB SAB Ect Mult Living                 Review of Systems  Musculoskeletal: Positive for arthralgias (left hip pain).  Neurological: Negative for weakness and numbness.  All other systems reviewed and are negative.     Allergies  Ketorolac tromethamine; Atrovent; and Latex  Home Medications   Prior to Admission medications   Medication Sig Start Date End Date Taking? Authorizing Provider  albuterol (PROVENTIL HFA;VENTOLIN HFA) 108 (90 BASE) MCG/ACT inhaler Inhale 2 puffs into the lungs every 4 (four) hours as needed for wheezing or shortness of breath. 04/09/13 04/09/14  Ejiroghene Emokpae, MD  amLODipine (NORVASC) 10 MG tablet Take 1 tablet (10 mg total) by mouth  daily. 06/21/13 06/21/14  Genelle GatherKathryn F Glenn, MD  HYDROcodone-acetaminophen [facility](NORCO) 10-325 MG per tablet Take 1 tablet by mouth every 8 (eight) hours as needed (pain.).    Historical Provider, MD  HYDROcodone-acetaminophen (NORCO/VICODIN) 5-325 MG per tablet Take 2 tablets by mouth every 4 (four) hours as needed. 09/10/13   Rolland PorterMark James, MD  lisinopril-hydrochlorothiazide (ZESTORETIC) 20-12.5 MG per tablet Take 2 tablets by mouth daily. 04/09/13 04/09/14  Kennis CarinaEjiroghene Emokpae, MD  methylPREDNISolone (MEDROL DOSEPAK) 4 MG tablet 6 po on day 1, then decrease by 1 per day 09/10/13   Rolland PorterMark James, MD  nitrofurantoin, macrocrystal-monohydrate, (MACROBID) 100 MG capsule Take 1 capsule (100 mg total) by mouth 2 (two) times daily. 09/10/13   Rolland PorterMark James, MD  pantoprazole (PROTONIX) 20 MG tablet Take 20 mg by mouth daily as needed for heartburn or indigestion. 06/21/13 06/21/14  Genelle GatherKathryn F Glenn, MD  traMADol (ULTRAM) 50 MG tablet Take 50 mg by mouth every 6 (six) hours as needed (pain.). 07/12/13   Tatyana A Kirichenko, PA-C   BP 163/82  Pulse 73  Temp(Src) 98 F (36.7 C) (Oral)  Resp 16  SpO2 99%  LMP 09/09/2009 Physical Exam  Nursing note and vitals reviewed. Constitutional: She is oriented to person, place, and time. She appears well-developed and well-nourished.  HENT:  Head: Normocephalic and atraumatic.  Cardiovascular: Normal rate.   Pulmonary/Chest: Effort normal.  Abdominal: She exhibits no distension.  Musculoskeletal: She exhibits tenderness.  Left lumbar paraspinal tenderness to palpation and lateral hip tenderness. Difficult examination due to body habitus.   Neurological: She is alert and oriented to person, place, and time.  Skin: Skin is warm and dry.  Psychiatric: She has a normal mood and affect.    ED Course  Procedures (including critical care time)  DIAGNOSTIC STUDIES: Oxygen Saturation is 99% on RA, normal by my interpretation.    COORDINATION OF CARE: 10:55 AM Discussed treatment plan  with pt at bedside and pt agreed to plan.   Labs Review Labs Reviewed - No data to display  Imaging Review No results found.   EKG Interpretation None      MDM   Final diagnoses:  Left hip pain    Patient's left hip pain is likely bursitis. Patient advised to follow up with PCP and orthopedist for further evaluation. No neurovascular compromise.   I personally performed the services described in this documentation, which was scribed in my presence. The recorded information has been reviewed and is accurate.   Emilia BeckKaitlyn Lucyann Romano, PA-C 10/29/13 1204

## 2013-11-17 ENCOUNTER — Emergency Department (HOSPITAL_COMMUNITY)
Admission: EM | Admit: 2013-11-17 | Discharge: 2013-11-17 | Disposition: A | Discharge status: Home / Self Care | Payer: Medicare Other | Attending: Emergency Medicine | Admitting: Emergency Medicine

## 2013-11-17 ENCOUNTER — Encounter (HOSPITAL_COMMUNITY): Payer: Self-pay | Admitting: Emergency Medicine

## 2013-11-17 ENCOUNTER — Emergency Department (HOSPITAL_COMMUNITY): Payer: Medicare Other

## 2013-11-17 DIAGNOSIS — Z8719 Personal history of other diseases of the digestive system: Secondary | ICD-10-CM | POA: Insufficient documentation

## 2013-11-17 DIAGNOSIS — M1710 Unilateral primary osteoarthritis, unspecified knee: Secondary | ICD-10-CM | POA: Insufficient documentation

## 2013-11-17 DIAGNOSIS — Z87891 Personal history of nicotine dependence: Secondary | ICD-10-CM | POA: Insufficient documentation

## 2013-11-17 DIAGNOSIS — Z79899 Other long term (current) drug therapy: Secondary | ICD-10-CM | POA: Insufficient documentation

## 2013-11-17 DIAGNOSIS — M5126 Other intervertebral disc displacement, lumbar region: Secondary | ICD-10-CM | POA: Insufficient documentation

## 2013-11-17 DIAGNOSIS — Z862 Personal history of diseases of the blood and blood-forming organs and certain disorders involving the immune mechanism: Secondary | ICD-10-CM | POA: Insufficient documentation

## 2013-11-17 DIAGNOSIS — Z791 Long term (current) use of non-steroidal anti-inflammatories (NSAID): Secondary | ICD-10-CM | POA: Insufficient documentation

## 2013-11-17 DIAGNOSIS — IMO0002 Reserved for concepts with insufficient information to code with codable children: Secondary | ICD-10-CM | POA: Insufficient documentation

## 2013-11-17 DIAGNOSIS — M545 Low back pain, unspecified: Secondary | ICD-10-CM

## 2013-11-17 DIAGNOSIS — I10 Essential (primary) hypertension: Secondary | ICD-10-CM | POA: Insufficient documentation

## 2013-11-17 DIAGNOSIS — Z91040 Latex allergy status: Secondary | ICD-10-CM | POA: Insufficient documentation

## 2013-11-17 DIAGNOSIS — J45909 Unspecified asthma, uncomplicated: Secondary | ICD-10-CM | POA: Insufficient documentation

## 2013-11-17 LAB — COMPREHENSIVE METABOLIC PANEL
ALT: 7 U/L (ref 0–35)
AST: 13 U/L (ref 0–37)
Albumin: 3.4 g/dL — ABNORMAL LOW (ref 3.5–5.2)
Alkaline Phosphatase: 120 U/L — ABNORMAL HIGH (ref 39–117)
BUN: 8 mg/dL (ref 6–23)
CO2: 28 mEq/L (ref 19–32)
Calcium: 9.6 mg/dL (ref 8.4–10.5)
Chloride: 103 mEq/L (ref 96–112)
Creatinine, Ser: 0.79 mg/dL (ref 0.50–1.10)
GFR calc Af Amer: >90 mL/min (ref >90)
GFR calc non Af Amer: >90 mL/min (ref >90)
Glucose, Bld: 96 mg/dL (ref 70–99)
Potassium: 3.7 mEq/L (ref 3.7–5.3)
Sodium: 142 mEq/L (ref 137–147)
Total Bilirubin: 0.2 mg/dL — ABNORMAL LOW (ref 0.3–1.2)
Total Protein: 6.9 g/dL (ref 6.0–8.3)

## 2013-11-17 LAB — CBC WITH DIFFERENTIAL/PLATELET
Basophils Absolute: 0 10*3/uL (ref 0.0–0.1)
Basophils Relative: 1 % (ref 0–1)
Eosinophils Absolute: 0.3 10*3/uL (ref 0.0–0.7)
Eosinophils Relative: 5 % (ref 0–5)
HCT: 36.7 % (ref 36.0–46.0)
Hemoglobin: 11.4 g/dL — ABNORMAL LOW (ref 12.0–15.0)
Lymphocytes Relative: 47 % — ABNORMAL HIGH (ref 12–46)
Lymphs Abs: 2.6 10*3/uL (ref 0.7–4.0)
MCH: 27.7 pg (ref 26.0–34.0)
MCHC: 31.1 g/dL (ref 30.0–36.0)
MCV: 89.3 fL (ref 78.0–100.0)
Monocytes Absolute: 0.3 10*3/uL (ref 0.1–1.0)
Monocytes Relative: 5 % (ref 3–12)
Neutro Abs: 2.3 10*3/uL (ref 1.7–7.7)
Neutrophils Relative %: 42 % — ABNORMAL LOW (ref 43–77)
Platelets: 334 10*3/uL (ref 150–400)
RBC: 4.11 MIL/uL (ref 3.87–5.11)
RDW: 14.1 % (ref 11.5–15.5)
WBC: 5.4 10*3/uL (ref 4.0–10.5)

## 2013-11-17 MED ORDER — HYDROMORPHONE HCL PF 1 MG/ML IJ SOLN
1.0000 mg | Freq: Once | INTRAMUSCULAR | Status: AC
  Administered 2013-11-17: 1 mg via INTRAVENOUS
  Filled 2013-11-17: qty 1

## 2013-11-17 MED ORDER — METHOCARBAMOL 1000 MG/10ML IJ SOLN
1000.0000 mg | Freq: Once | INTRAMUSCULAR | Status: AC
  Administered 2013-11-17: 1000 mg via INTRAVENOUS
  Filled 2013-11-17: qty 10

## 2013-11-17 MED ORDER — METHOCARBAMOL 1000 MG/10ML IJ SOLN: 1000.0000 mg | Freq: Once | INTRAMUSCULAR | Status: DC

## 2013-11-17 MED ORDER — OXYCODONE-ACETAMINOPHEN 5-325 MG PO TABS
1.0000 | ORAL_TABLET | Freq: Once | ORAL | Status: AC
  Administered 2013-11-17: 1 via ORAL
  Filled 2013-11-17: qty 1

## 2013-11-17 MED ORDER — PREDNISONE 20 MG PO TABS: ORAL_TABLET | ORAL | Status: DC

## 2013-11-17 MED ORDER — METHOCARBAMOL 500 MG PO TABS: 500.0000 mg | ORAL_TABLET | Freq: Two times a day (BID) | ORAL | Status: DC | PRN

## 2013-11-17 MED ORDER — OXYCODONE-ACETAMINOPHEN 5-325 MG PO TABS: 1.0000 | ORAL_TABLET | Freq: Four times a day (QID) | ORAL | Status: DC | PRN

## 2013-11-17 MED ORDER — ONDANSETRON HCL 4 MG/2ML IJ SOLN
4.0000 mg | Freq: Once | INTRAMUSCULAR | Status: AC
  Administered 2013-11-17: 4 mg via INTRAVENOUS
  Filled 2013-11-17: qty 2

## 2013-11-17 NOTE — ED Provider Notes (Signed)
11:07 AM MRI w/ L3-L4, L4-L5 disk herniation w/o central cord involvement. Will have pt f/u as outpt w/ [facility]. Rx givne for 5D prednisone, robaxin, percocet for breath through pain. She will discuss PT w/ her PCP. Return precautions given for new or worsening symptoms including s/sx of cord compression, fever.   1. Low back pain   2. Lumbar disc herniation     MR LUMBAR SPINE WO CONTRAST   Final Result:         MR Lumbar Spine Wo Contrast (Final result)  Result time: 11/17/13 08:59:41    Final result by Rad Results In Interface (11/17/13 08:59:41)    Narrative:   CLINICAL DATA: Sudden onset of constant left low back pain extending into the buttocks beginning 3 days ago. Pain began after reaching down for some pain and has been persistent.  EXAM: MRI LUMBAR SPINE WITHOUT CONTRAST  TECHNIQUE: Multiplanar, multisequence MR imaging of the lumbar spine was performed. No intravenous contrast was administered.  COMPARISON: None.  FINDINGS: Normal signal is present in the conus medullaris which terminates at L1-2. Marrow signal, vertebral body heights, and alignment are normal.  The disc levels at L1-2 and above are normal.  L2-3: Mild facet hypertrophy is worse on the right. There is no significant stenosis.  L3-4: A far right lateral disc protrusion and annular tear are present. This results in mild right foraminal narrowing. The central canal and left foramen are patent. Mild facet hypertrophy is noted.  L4-5: Minimal disc bulging is asymmetric to the left. There is a left paramedian annular tear. Mild facet hypertrophy is present. There is no significant stenosis.  L5-S1: A left laminar defect at the is likely congenital. No significant disc protrusion or stenosis is evident.  IMPRESSION: 1. Shallow right far central disc protrusion at L3-4 results in mild right foraminal narrowing. 2. Mild disc bulging and left paramedian annular tear at L4-5 without significant  stenosis. 3. Multilevel facet hypertrophy as described.   Electronically Signed By: Gennette Pachris Mattern M.D. On: 11/17/2013 08:59      Shanna CiscoMegan E Maxx Calaway, MD 11/17/13 1108

## 2013-11-17 NOTE — Discharge Instructions (Signed)
Back Pain, Adult Low back pain is very common. About 1 in 5 people have back pain.The cause of low back pain is rarely dangerous. The pain often gets better over time.About half of people with a sudden onset of back pain feel better in just 2 weeks. About 8 in 10 people feel better by 6 weeks.  CAUSES Some common causes of back pain include:  Strain of the muscles or ligaments supporting the spine.  Wear and tear (degeneration) of the spinal discs.  Arthritis.  Direct injury to the back. DIAGNOSIS Most of the time, the direct cause of low back pain is not known.However, back pain can be treated effectively even when the exact cause of the pain is unknown.Answering your caregiver's questions about your overall health and symptoms is one of the most accurate ways to make sure the cause of your pain is not dangerous. If your caregiver needs more information, he or she may order lab work or imaging tests (X-rays or MRIs).However, even if imaging tests show changes in your back, this usually does not require surgery. HOME CARE INSTRUCTIONS For many people, back pain returns.Since low back pain is rarely dangerous, it is often a condition that people can learn to manageon their own.   Remain active. It is stressful on the back to sit or stand in one place. Do not sit, drive, or stand in one place for more than 30 minutes at a time. Take short walks on level surfaces as soon as pain allows.Try to increase the length of time you walk each day.  Do not stay in bed.Resting more than 1 or 2 days can delay your recovery.  Do not avoid exercise or work.Your body is made to move.It is not dangerous to be active, even though your back may hurt.Your back will likely heal faster if you return to being active before your pain is gone.  Pay attention to your body when you bend and lift. Many people have less discomfortwhen lifting if they bend their knees, keep the load close to their bodies,and  avoid twisting. Often, the most comfortable positions are those that put less stress on your recovering back.  Find a comfortable position to sleep. Use a firm mattress and lie on your side with your knees slightly bent. If you lie on your back, put a pillow under your knees.  Only take over-the-counter or prescription medicines as directed by your caregiver. Over-the-counter medicines to reduce pain and inflammation are often the most helpful.Your caregiver may prescribe muscle relaxant drugs.These medicines help dull your pain so you can more quickly return to your normal activities and healthy exercise.  Put ice on the injured area.  Put ice in a plastic bag.  Place a towel between your skin and the bag.  Leave the ice on for 15-20 minutes, 03-04 times a day for the first 2 to 3 days. After that, ice and heat may be alternated to reduce pain and spasms.  Ask your caregiver about trying back exercises and gentle massage. This may be of some benefit.  Avoid feeling anxious or stressed.Stress increases muscle tension and can worsen back pain.It is important to recognize when you are anxious or stressed and learn ways to manage it.Exercise is a great option. SEEK MEDICAL CARE IF:  You have pain that is not relieved with rest or medicine.  You have pain that does not improve in 1 week.  You have new symptoms.  You are generally not feeling well. SEEK   IMMEDIATE MEDICAL CARE IF:   You have pain that radiates from your back into your legs.  You develop new bowel or bladder control problems.  You have unusual weakness or numbness in your arms or legs.  You develop nausea or vomiting.  You develop abdominal pain.  You feel faint. Document Released: 06/27/2005 Document Revised: 12/27/2011 Document Reviewed: 11/15/2010 [employer] Patient Information 2014 [employer].  

## 2013-11-17 NOTE — ED Provider Notes (Signed)
CSN: [id number]     Arrival date & time 11/17/13  0306 History   First MD Initiated Contact with Patient 11/17/13 0405     Chief Complaint  Patient presents with  . Flank Pain     (Consider location/radiation/quality/duration/timing/severity/associated sxs/prior Treatment) Patient is a 58 y.o. female presenting with flank pain. The history is provided by the patient.  Flank Pain  She has been having pain across her lower back into both buttocks over the last 2 days. Pain is severe and she rates it 9/10. Pain is worse with certain movements and gets up to 10/10. There is some radiation of pain to both thighs. There's no weakness, numbness, tingling. She denies any bowel or bladder dysfunction. She's tried taking a variety of medications which she had been prescribed recently including, talk, hydrocodone and acetaminophen, prednisone, and diazepam. None of them have been given her any relief. She has been seen several times for low back pain and sciatica with a variety of prescriptions given but none of them have given her any significant relief. She denies any recent trauma or unusual bending or lifting or twisting.  Past Medical History  Diagnosis Date  . Hypertension   . Hyperlipidemia   . Asthma   . Obesity   . Lower extremity edema     Chronic. 2D echo (2005) - EF 65%.  . Seasonal allergies   . Abnormal EKG 06/2003    History of inverted T waves V1-V3. Normal 2D echo (07/23/2003): LVEF 65%.  . History of multiple pulmonary nodules     Incidental finding: CT Abd/ Pelvis (04/2010) - Several small lower lobe lung nodules, including one pure ground-glass pulmonary nodule measuring 8 mm in the left lower lobe.  Recommend follow-up chest CT (IV contrast preferred) in 6 months to document stability. //  CT Abd/ Pelvis (07/2010) -  3 mm RLL and  8 mm LLL nodule stable.  Other nodules unchanged, likely benign.  . 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 Followed by Sports  Medicine, Dr. Jennette KettleNeal.  . Incarcerated ventral hernia 04/2010    Noted on CT Abd/ pelvis (04/2010). Patient now s/p ventral hernia repair by Dr. Gerrit FriendsGerkin (12/2010)  . Anemia     BL Hgb 11-12. Ferritin 14 - low normal (08/2007). Colonoscopy 2009 - external hemorrhoids (excellent prep). Last anemia panel (12/2010) - Iron  24, TIBC 269,  B12  316, Folate 11.9, Ferritin 73.  . Wears dentures   . Wears glasses   . Knee osteoarthritis     s/p Left total knee replacement (06/2011)   Past Surgical History  Procedure Laterality Date  . Incision and drainage abscess anal  07/2008    I&D and debridgement of anorectal abscess  . Inguinal hernia repair    . Tubal ligation    . Incisional hernia repair  12/2010    Repair of incarcerated ventral incisional hernia with Ethicon mesh patch - performed by Dr. Gerrit FriendsGerkin.   . Foot surgery  2004     left  . Total knee arthroplasty  06/16/2011    Left TOTAL KNEE ARTHROPLASTY;  Surgeon: Kennieth RadArthur F Carter;  Location: [facility] OR;  Service: Orthopedics;  Laterality: Left;  left total knee arthroplasty   Family History  Problem Relation Age of Onset  . Diabetes Mother   . Hypertension Mother   . Stroke Mother   . Heart attack Neg Hx   . Dementia Father   . Rashes / Skin problems Maternal  Grandfather    History  Substance Use Topics  . Smoking status: Former Smoker -- 0.50 packs/day for 20 years    Types: Cigarettes    Quit date: 09/10/1998  . Smokeless tobacco: Not on file  . Alcohol Use: No   OB History   Grav Para Term Preterm Abortions TAB SAB Ect Mult Living                 Review of Systems  Genitourinary: Positive for flank pain.  All other systems reviewed and are negative.     Allergies  Ketorolac tromethamine; Atrovent; and Latex  Home Medications   Prior to Admission medications   Medication Sig Start Date End Date Taking? Authorizing Provider  albuterol (PROVENTIL HFA;VENTOLIN HFA) 108 (90 BASE) MCG/ACT inhaler Inhale 2 puffs into the lungs  every 4 (four) hours as needed for wheezing or shortness of breath. 04/09/13 04/09/14  Ejiroghene Emokpae, MD  amLODipine (NORVASC) 10 MG tablet Take 1 tablet (10 mg total) by mouth daily. 06/21/13 06/21/14  Genelle Gather, MD  HYDROcodone-acetaminophen [facility]) 10-325 MG per tablet Take 1 tablet by mouth every 8 (eight) hours as needed (pain.).    Historical Provider, MD  HYDROcodone-acetaminophen (NORCO/VICODIN) 5-325 MG per tablet Take 2 tablets by mouth every 4 (four) hours as needed. 09/10/13   Rolland Porter, MD  lisinopril-hydrochlorothiazide (ZESTORETIC) 20-12.5 MG per tablet Take 2 tablets by mouth daily. 04/09/13 04/09/14  Kennis Carina, MD  methylPREDNISolone (MEDROL DOSEPAK) 4 MG tablet 6 po on day 1, then decrease by 1 per day 09/10/13   Rolland Porter, MD  naproxen (NAPROSYN) 500 MG tablet Take 1 tablet (500 mg total) by mouth 2 (two) times daily with a meal. 10/29/13   Emilia Beck, PA-C  nitrofurantoin, macrocrystal-monohydrate, (MACROBID) 100 MG capsule Take 1 capsule (100 mg total) by mouth 2 (two) times daily. 09/10/13   Rolland Porter, MD  pantoprazole (PROTONIX) 20 MG tablet Take 20 mg by mouth daily as needed for heartburn or indigestion. 06/21/13 06/21/14  Genelle Gather, MD  traMADol (ULTRAM) 50 MG tablet Take 50 mg by mouth every 6 (six) hours as needed (pain.). 07/12/13   Tatyana A Kirichenko, PA-C   BP 145/124  Pulse 91  Temp(Src) 98.1 F (36.7 C) (Oral)  Resp 16  SpO2 98%  LMP 09/09/2009 Physical Exam  Nursing note and vitals reviewed.  Morbidly obese 58 year old female, who appears uncomfortable in no acute distress. Vital signs are significant for hypertension with blood pressure 145/124. Oxygen saturation is 98%, which is normal. Head is normocephalic and atraumatic. PERRLA, EOMI. Oropharynx is clear. Neck is nontender and supple without adenopathy or JVD. Back is mildly tender in the mid and lower lumbar area. There is moderate bilateral paralumbar spasm. Straight leg raise  is positive bilaterally at 30. There is no CVA tenderness. Lungs are clear without rales, wheezes, or rhonchi. Chest is nontender. Heart has regular rate and rhythm without murmur. Abdomen is soft, flat, nontender without masses or hepatosplenomegaly and peristalsis is normoactive. Extremities have no cyanosis or edema, full range of motion is present. Skin is warm and dry without rash. Neurologic: Mental status is normal, cranial nerves are intact, there are no motor or sensory deficits. Strength is 5/5 in all leg muscle groups.  ED Course  Procedures (including critical care time) Labs Review Results for orders placed during the hospital encounter of 11/17/13  CBC WITH DIFFERENTIAL      Result Value Ref Range   WBC 5.4  4.0 - 10.5 K/uL   RBC 4.11  3.87 - 5.11 MIL/uL   Hemoglobin 11.4 (*) 12.0 - 15.0 g/dL   HCT 16.136.7  09.636.0 - 04.546.0 %   MCV 89.3  78.0 - 100.0 fL   MCH 27.7  26.0 - 34.0 pg   MCHC 31.1  30.0 - 36.0 g/dL   RDW 40.914.1  81.111.5 - 91.415.5 %   Platelets 334  150 - 400 K/uL   Neutrophils Relative % 42 (*) 43 - 77 %   Neutro Abs 2.3  1.7 - 7.7 K/uL   Lymphocytes Relative 47 (*) 12 - 46 %   Lymphs Abs 2.6  0.7 - 4.0 K/uL   Monocytes Relative 5  3 - 12 %   Monocytes Absolute 0.3  0.1 - 1.0 K/uL   Eosinophils Relative 5  0 - 5 %   Eosinophils Absolute 0.3  0.0 - 0.7 K/uL   Basophils Relative 1  0 - 1 %   Basophils Absolute 0.0  0.0 - 0.1 K/uL  COMPREHENSIVE METABOLIC PANEL      Result Value Ref Range   Sodium 142  137 - 147 mEq/L   Potassium 3.7  3.7 - 5.3 mEq/L   Chloride 103  96 - 112 mEq/L   CO2 28  19 - 32 mEq/L   Glucose, Bld 96  70 - 99 mg/dL   BUN 8  6 - 23 mg/dL   Creatinine, Ser 7.820.79  0.50 - 1.10 mg/dL   Calcium 9.6  8.4 - 95.610.5 mg/dL   Total Protein 6.9  6.0 - 8.3 g/dL   Albumin 3.4 (*) 3.5 - 5.2 g/dL   AST 13  0 - 37 U/L   ALT 7  0 - 35 U/L   Alkaline Phosphatase 120 (*) 39 - 117 U/L   Total Bilirubin 0.2 (*) 0.3 - 1.2 mg/dL   GFR calc non Af Amer >90  >90  mL/min   GFR calc Af Amer >90  >90 mL/min    MDM   Final diagnoses:  Low back pain  Lumbar disc herniation    Exacerbation of low back pain. Old records are reviewed and she has several visits for what has been described as low back pain, sciatica, and lumbar radiculopathy. She's not had any imaging of her spine although she has had CT scans of abdomen and pelvis in the past. She will be given intravenous hydromorphone and methocarbamol and she will be sent for MRI scan of lumbar spine. Case is signed out to Dr. Micheline Mazeocherty.    Dione Boozeavid Ayme Short, MD 11/17/13 [phone]

## 2013-11-17 NOTE — ED Notes (Signed)
Patient has been experiencing left flank and lower back and buttock pain since Friday. Patient denies hematuria or urinary frequency. Patient was ambulatory to room. Pain is worse when she bends over.

## 2013-12-31 ENCOUNTER — Encounter: Payer: Self-pay | Admitting: Internal Medicine

## 2014-01-29 ENCOUNTER — Ambulatory Visit: Payer: Medicare Other | Admitting: Internal Medicine

## 2014-05-06 ENCOUNTER — Encounter: Payer: Medicare Other | Admitting: Internal Medicine

## 2014-05-12 ENCOUNTER — Other Ambulatory Visit (INDEPENDENT_AMBULATORY_CARE_PROVIDER_SITE_OTHER): Payer: Self-pay

## 2014-05-12 DIAGNOSIS — R1033 Periumbilical pain: Secondary | ICD-10-CM

## 2014-05-13 ENCOUNTER — Ambulatory Visit (INDEPENDENT_AMBULATORY_CARE_PROVIDER_SITE_OTHER): Payer: Medicare Other | Admitting: Internal Medicine

## 2014-05-13 ENCOUNTER — Encounter: Payer: Self-pay | Admitting: Internal Medicine

## 2014-05-13 VITALS — BP 185/67 | HR 72 | Temp 98.1°F | Ht 65.0 in | Wt 339.5 lb

## 2014-05-13 DIAGNOSIS — K430 Incisional hernia with obstruction, without gangrene: Secondary | ICD-10-CM

## 2014-05-13 DIAGNOSIS — I10 Essential (primary) hypertension: Secondary | ICD-10-CM

## 2014-05-13 MED ORDER — LUMBAR BACK BRACE/SUPPORT PAD MISC: 1.0000 [IU] | Status: DC | PRN

## 2014-05-13 MED ORDER — LISINOPRIL-HYDROCHLOROTHIAZIDE 20-12.5 MG PO TABS: 2.0000 | ORAL_TABLET | Freq: Every day | ORAL | Status: DC

## 2014-05-13 MED ORDER — TRAMADOL HCL 50 MG PO TABS: 50.0000 mg | ORAL_TABLET | Freq: Four times a day (QID) | ORAL | Status: DC | PRN

## 2014-05-13 MED ORDER — AMLODIPINE BESYLATE 10 MG PO TABS: 10.0000 mg | ORAL_TABLET | Freq: Every day | ORAL | Status: DC

## 2014-05-13 NOTE — Assessment & Plan Note (Signed)
BP Readings from Last 3 Encounters:  05/13/14 185/67  11/17/13 146/47  10/29/13 151/74    Lab Results  Component Value Date   NA 142 11/17/2013   K 3.7 11/17/2013   CREATININE 0.79 11/17/2013    Assessment: Blood pressure control:  Elevated today Progress toward BP goal:   not at goal Comments: pt admits to not taking her medications in several weeks.   Plan: Medications:  Refilled lisinopril-HCTZ- 40-25mg , and amlodipine- 10mg  daily. Educational resources provided:   Self management tools provided:   Other plans:

## 2014-05-13 NOTE — Progress Notes (Signed)
Patient ID: [name], female   DOB: [birth year], 58 y.o.   MRN: [id number] .   Subjective:   Patient ID: [name] female   DOB: [birth year] 58 y.o.   MRN: [id number]  HPI: [name] is a 58 y.o. with PMH of chronic abdominal pain, back pain with sciatica, HTN,  Presented today for routine follow up.  Past Medical History  Diagnosis Date  . Hypertension   . Hyperlipidemia   . Asthma   . Obesity   . Lower extremity edema     Chronic. 2D echo (2005) - EF 65%.  . Seasonal allergies   . Abnormal EKG 06/2003    History of inverted T waves V1-V3. Normal 2D echo (07/23/2003): LVEF 65%.  . History of multiple pulmonary nodules     Incidental finding: CT Abd/ Pelvis (04/2010) - Several small lower lobe lung nodules, including one pure ground-glass pulmonary nodule measuring 8 mm in the left lower lobe.  Recommend follow-up chest CT (IV contrast preferred) in 6 months to document stability. //  CT Abd/ Pelvis (07/2010) -  3 mm RLL and  8 mm LLL nodule stable.  Other nodules unchanged, likely benign.  . 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 Followed by Sports Medicine, Dr. Jennette KettleNeal.  . Incarcerated ventral hernia 04/2010    Noted on CT Abd/ pelvis (04/2010). Patient now s/p ventral hernia repair by Dr. Gerrit FriendsGerkin (12/2010)  . Anemia     BL Hgb 11-12. Ferritin 14 - low normal (08/2007). Colonoscopy 2009 - external hemorrhoids (excellent prep). Last anemia panel (12/2010) - Iron  24, TIBC 269,  B12  316, Folate 11.9, Ferritin 73.  . Wears dentures   . Wears glasses   . Knee osteoarthritis     s/p Left total knee replacement (06/2011)    Review of Systems: CONSTITUTIONAL- No Fever, weightloss, night sweat or change in appetite. SKIN- No Rash, colour changes or itching. HEAD- No Headache or dizziness. RESPIRATORY- No Cough or SOB. CARDIAC- No Palpitations, DOE, PND or chest pain. GI- No nausea, vomiting, diarrhoea, constipation, abd pain. URINARY- No Frequency,  urgency, straining or dysuria. NEUROLOGIC- No Numbness, syncope, seizures or burning. [facility]- Denies depression or anxiety.  Objective:  Physical Exam: Filed Vitals:   05/13/14 1721  BP: 185/67  Pulse: 72  Temp: 98.1 F (36.7 C)  TempSrc: Oral  Height: 5\' 5"  (1.651 m)  Weight: 339 lb 8 oz (153.996 kg)  SpO2: 100%   GENERAL- alert, co-operative, appears as stated age, not in any distress. HEENT- Atraumatic, normocephalic, PERRL, EOMI, oral mucosa appears moist, neck supple. CARDIAC- RRR, no murmurs, rubs or gallops. RESP- Moving equal volumes of air, and clear to auscultation bilaterally, no wheezes or crackles. ABDOMEN- Soft, obese, non tender, no guarding or rebound, umbilical area with healed surgical incision scar for previous umbilical area repair, no observable hernia with coughing, minimal tenderness on palpation, no palpable masses or organomegaly, bowel sounds present. BACK- Normal curvature of the spine, No tenderness along the vertebrae, no CVA tenderness. NEURO- No obvious Cr N abnormality, strenght upper and lower extremities- 5/5, Sensation intact- globally, DTRs- Normal, finger to nose test normal bilat, rapid alternating movement- intact, Gait- Normal. EXTREMITIES- pulse 2+, symmetric, no pedal edema. SKIN- Warm, dry, No rash or lesion. PSYCH- Normal mood and affect, appropriate thought content and speech.  Assessment & Plan:   The patient's case and plan of care was discussed with attending physician, Dr. Criselda PeachesMullen.  Please  see problem based charting for assessment and plan.

## 2014-05-13 NOTE — Patient Instructions (Signed)
General Instructions: Please take your blood pressure medications. I have prescribed some tramadol to help with the pain in your back and hip.    Please bring your medicines with you each time you come to clinic.  Medicines may include prescription medications, over-the-counter medications, herbal remedies, eye drops, vitamins, or other pills.   IT WAS NICE TO SEE YOU TODAY.   Exercise to Lose Weight Exercise and a healthy diet may help you lose weight. Your doctor may suggest specific exercises. EXERCISE IDEAS AND TIPS  Choose low-cost things you enjoy doing, such as walking, bicycling, or exercising to workout videos.  Take stairs instead of the elevator.  Walk during your lunch break.  Park your car further away from work or school.  Go to a gym or an exercise class.  Start with 5 to 10 minutes of exercise each day. Build up to 30 minutes of exercise 4 to 6 days a week.  Wear shoes with good support and comfortable clothes.  Stretch before and after working out.  Work out until you breathe harder and your heart beats faster.  Drink extra water when you exercise.  Do not do so much that you hurt yourself, feel dizzy, or get very short of breath. Exercises that burn about 150 calories:  Running 1  miles in 15 minutes.  Playing volleyball for 45 to 60 minutes.  Washing and waxing a car for 45 to 60 minutes.  Playing touch football for 45 minutes.  Walking 1  miles in 35 minutes.  Pushing a stroller 1  miles in 30 minutes.  Playing basketball for 30 minutes.  Raking leaves for 30 minutes.  Bicycling 5 miles in 30 minutes.  Walking 2 miles in 30 minutes.  Dancing for 30 minutes.  Shoveling snow for 15 minutes.  Swimming laps for 20 minutes.  Walking up stairs for 15 minutes.  Bicycling 4 miles in 15 minutes.  Gardening for 30 to 45 minutes.  Jumping rope for 15 minutes.  Washing windows or floors for 45 to 60 minutes. Document Released: 07/30/2010  Document Revised: 09/19/2011 Document Reviewed: 07/30/2010 [facility] Patient Information 2015 [employer], [place]. This information is not intended to replace advice given to you by your health care provider. Make sure you discuss any questions you have with your health care provider.

## 2014-05-13 NOTE — Assessment & Plan Note (Addendum)
Pt with abdominal pain today- periumbilical, no findings sugestive of hernia on exam, or with coughing, but abdomen is obese. Pain present for months she says. Follows with [facility].   03/15/2013- Ct abdomen- No acute intra-abdominal abnormality, Umbilical hernia containing fat.  Plan- Pt Scheduled to have Ct scan- 05/19/2014.

## 2014-05-13 NOTE — Assessment & Plan Note (Signed)
11/17/2013- MRI- Shallow right far central disc protrusion at L3-4 results in mild right foraminal narrowing. 2. Mild disc bulging and left paramedian annular tear at L4-5 without significant stenosis. 3. Multilevel facet hypertrophy as described.  Pt still complaining of significant pain. Asking for something to help relieve pain. Has tried ibuprofen, prescription strenght without relief. Pt obese and this might also be contributing. Tried Physical therapy without relief.  Plan- Pt encouraged to loose weight. - Tramadol 50mg  Q6H PRN #30. Pt told this is not a permanent medication that will be refilled and this is to help her get along with her daily activity.

## 2014-05-18 NOTE — Addendum Note (Signed)
Addended by: Debe CoderMULLEN, EMILY B on: 05/18/2014 09:31 PM   Modules accepted: Level of Service

## 2014-05-18 NOTE — Progress Notes (Signed)
Internal Medicine Clinic Attending  Case discussed with Dr. Emokpae soon after the resident saw the patient.  We reviewed the resident's history and exam and pertinent patient test results.  I agree with the assessment, diagnosis, and plan of care documented in the resident's note. 

## 2014-05-19 ENCOUNTER — Encounter (HOSPITAL_COMMUNITY): Payer: Self-pay

## 2014-05-19 ENCOUNTER — Ambulatory Visit (HOSPITAL_COMMUNITY)
Admission: RE | Admit: 2014-05-19 | Discharge: 2014-05-19 | Disposition: A | Discharge status: Home / Self Care | Payer: Medicare Other | Source: Ambulatory Visit | Attending: Surgery | Admitting: Surgery

## 2014-05-19 DIAGNOSIS — R1033 Periumbilical pain: Secondary | ICD-10-CM | POA: Insufficient documentation

## 2014-05-19 MED ORDER — IOHEXOL 300 MG/ML  SOLN
100.0000 mL | Freq: Once | INTRAMUSCULAR | Status: AC | PRN
  Administered 2014-05-19: 100 mL via INTRAVENOUS

## 2014-06-24 ENCOUNTER — Encounter (HOSPITAL_COMMUNITY): Payer: Self-pay | Admitting: Emergency Medicine

## 2014-06-24 ENCOUNTER — Emergency Department (HOSPITAL_COMMUNITY): Payer: Medicare Other

## 2014-06-24 ENCOUNTER — Emergency Department (HOSPITAL_COMMUNITY)
Admission: EM | Admit: 2014-06-24 | Discharge: 2014-06-25 | Disposition: A | Discharge status: Home / Self Care | Payer: Medicare Other | Attending: Emergency Medicine | Admitting: Emergency Medicine

## 2014-06-24 DIAGNOSIS — Z96652 Presence of left artificial knee joint: Secondary | ICD-10-CM | POA: Diagnosis not present

## 2014-06-24 DIAGNOSIS — Z972 Presence of dental prosthetic device (complete) (partial): Secondary | ICD-10-CM | POA: Insufficient documentation

## 2014-06-24 DIAGNOSIS — I10 Essential (primary) hypertension: Secondary | ICD-10-CM | POA: Insufficient documentation

## 2014-06-24 DIAGNOSIS — Z8719 Personal history of other diseases of the digestive system: Secondary | ICD-10-CM | POA: Diagnosis not present

## 2014-06-24 DIAGNOSIS — Z7952 Long term (current) use of systemic steroids: Secondary | ICD-10-CM | POA: Insufficient documentation

## 2014-06-24 DIAGNOSIS — Z79899 Other long term (current) drug therapy: Secondary | ICD-10-CM | POA: Diagnosis not present

## 2014-06-24 DIAGNOSIS — E669 Obesity, unspecified: Secondary | ICD-10-CM | POA: Diagnosis not present

## 2014-06-24 DIAGNOSIS — R079 Chest pain, unspecified: Secondary | ICD-10-CM | POA: Diagnosis present

## 2014-06-24 DIAGNOSIS — Z862 Personal history of diseases of the blood and blood-forming organs and certain disorders involving the immune mechanism: Secondary | ICD-10-CM | POA: Diagnosis not present

## 2014-06-24 DIAGNOSIS — J45901 Unspecified asthma with (acute) exacerbation: Secondary | ICD-10-CM | POA: Diagnosis not present

## 2014-06-24 DIAGNOSIS — Z87891 Personal history of nicotine dependence: Secondary | ICD-10-CM | POA: Diagnosis not present

## 2014-06-24 DIAGNOSIS — M1712 Unilateral primary osteoarthritis, left knee: Secondary | ICD-10-CM | POA: Diagnosis not present

## 2014-06-24 LAB — CBC WITH DIFFERENTIAL/PLATELET
Basophils Absolute: 0 10*3/uL (ref 0.0–0.1)
Basophils Relative: 1 % (ref 0–1)
Eosinophils Absolute: 0.3 10*3/uL (ref 0.0–0.7)
Eosinophils Relative: 5 % (ref 0–5)
HCT: 40.2 % (ref 36.0–46.0)
Hemoglobin: 12.6 g/dL (ref 12.0–15.0)
Lymphocytes Relative: 43 % (ref 12–46)
Lymphs Abs: 2.4 10*3/uL (ref 0.7–4.0)
MCH: 28.3 pg (ref 26.0–34.0)
MCHC: 31.3 g/dL (ref 30.0–36.0)
MCV: 90.3 fL (ref 78.0–100.0)
Monocytes Absolute: 0.4 10*3/uL (ref 0.1–1.0)
Monocytes Relative: 8 % (ref 3–12)
Neutro Abs: 2.4 10*3/uL (ref 1.7–7.7)
Neutrophils Relative %: 43 % (ref 43–77)
Platelets: 321 10*3/uL (ref 150–400)
RBC: 4.45 MIL/uL (ref 3.87–5.11)
RDW: 14.1 % (ref 11.5–15.5)
WBC: 5.6 10*3/uL (ref 4.0–10.5)

## 2014-06-24 LAB — COMPREHENSIVE METABOLIC PANEL
ALT: 8 U/L (ref 0–35)
AST: 13 U/L (ref 0–37)
Albumin: 4 g/dL (ref 3.5–5.2)
Alkaline Phosphatase: 126 U/L — ABNORMAL HIGH (ref 39–117)
Anion gap: 13 (ref 5–15)
BUN: 9 mg/dL (ref 6–23)
CO2: 24 mEq/L (ref 19–32)
Calcium: 10.8 mg/dL — ABNORMAL HIGH (ref 8.4–10.5)
Chloride: 102 mEq/L (ref 96–112)
Creatinine, Ser: 0.92 mg/dL (ref 0.50–1.10)
GFR calc Af Amer: 79 mL/min — ABNORMAL LOW (ref >90)
GFR calc non Af Amer: 68 mL/min — ABNORMAL LOW (ref >90)
Glucose, Bld: 87 mg/dL (ref 70–99)
Potassium: 3.6 mEq/L — ABNORMAL LOW (ref 3.7–5.3)
Sodium: 139 mEq/L (ref 137–147)
Total Bilirubin: 0.2 mg/dL — ABNORMAL LOW (ref 0.3–1.2)
Total Protein: 7.6 g/dL (ref 6.0–8.3)

## 2014-06-24 LAB — D-DIMER, QUANTITATIVE (NOT AT ARMC): D-Dimer, Quant: 0.83 ug/mL-FEU — ABNORMAL HIGH (ref 0.00–0.48)

## 2014-06-24 LAB — TROPONIN I
Troponin I: <0.3 ng/mL (ref <0.30)
Troponin I: <0.3 ng/mL (ref <0.30)

## 2014-06-24 MED ORDER — HYDROMORPHONE HCL 1 MG/ML IJ SOLN
0.5000 mg | Freq: Once | INTRAMUSCULAR | Status: AC
  Administered 2014-06-24: 0.5 mg via INTRAVENOUS
  Filled 2014-06-24: qty 1

## 2014-06-24 MED ORDER — IOHEXOL 350 MG/ML SOLN
100.0000 mL | Freq: Once | INTRAVENOUS | Status: AC | PRN
  Administered 2014-06-24: 100 mL via INTRAVENOUS

## 2014-06-24 MED ORDER — ONDANSETRON HCL 4 MG/2ML IJ SOLN
4.0000 mg | Freq: Once | INTRAMUSCULAR | Status: AC
  Administered 2014-06-24: 4 mg via INTRAVENOUS
  Filled 2014-06-24: qty 2

## 2014-06-24 MED ORDER — GI COCKTAIL ~~LOC~~
30.0000 mL | Freq: Once | ORAL | Status: AC
  Administered 2014-06-24: 30 mL via ORAL
  Filled 2014-06-24: qty 30

## 2014-06-24 NOTE — ED Provider Notes (Signed)
CSN: [id number]     Arrival date & time 06/24/14  1734 History   First MD Initiated Contact with Patient 06/24/14 1751     Chief Complaint  Patient presents with  . Chest Pain    Onset was Saturday, worse when eating     (Consider location/radiation/quality/duration/timing/severity/associated sxs/prior Treatment) Patient is a 58 y.o. female presenting with chest pain. The history is provided by the patient.  Chest Pain Pain location:  Substernal area Pain quality: burning and sharp   Pain radiates to:  Upper back Pain radiates to the back: yes   Pain severity:  Moderate Onset quality:  Gradual Duration:  3 days Timing:  Constant Progression:  Unchanged Chronicity:  New Context: at rest   Relieved by:  Nothing Worsened by:  Nothing tried Ineffective treatments:  None tried Associated symptoms: shortness of breath   Associated symptoms: no abdominal pain, no back pain, no cough, no dizziness, no fatigue, no fever, no headache, no nausea and not vomiting     Past Medical History  Diagnosis Date  . Hypertension   . Hyperlipidemia   . Asthma   . Obesity   . Lower extremity edema     Chronic. 2D echo (2005) - EF 65%.  . Seasonal allergies   . Abnormal EKG 06/2003    History of inverted T waves V1-V3. Normal 2D echo (07/23/2003): LVEF 65%.  . History of multiple pulmonary nodules     Incidental finding: CT Abd/ Pelvis (04/2010) - Several small lower lobe lung nodules, including one pure ground-glass pulmonary nodule measuring 8 mm in the left lower lobe.  Recommend follow-up chest CT (IV contrast preferred) in 6 months to document stability. //  CT Abd/ Pelvis (07/2010) -  3 mm RLL and  8 mm LLL nodule stable.  Other nodules unchanged, likely benign.  . 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 Followed by Sports Medicine, Dr. Jennette KettleNeal.  . Incarcerated ventral hernia 04/2010    Noted on CT Abd/ pelvis (04/2010). Patient now s/p ventral hernia repair by Dr.  Gerrit FriendsGerkin (12/2010)  . Anemia     BL Hgb 11-12. Ferritin 14 - low normal (08/2007). Colonoscopy 2009 - external hemorrhoids (excellent prep). Last anemia panel (12/2010) - Iron  24, TIBC 269,  B12  316, Folate 11.9, Ferritin 73.  . Wears dentures   . Wears glasses   . Knee osteoarthritis     s/p Left total knee replacement (06/2011)   Past Surgical History  Procedure Laterality Date  . Incision and drainage abscess anal  07/2008    I&D and debridgement of anorectal abscess  . Inguinal hernia repair    . Tubal ligation    . Incisional hernia repair  12/2010    Repair of incarcerated ventral incisional hernia with Ethicon mesh patch - performed by Dr. Gerrit FriendsGerkin.   . Foot surgery  2004     left  . Total knee arthroplasty  06/16/2011    Left TOTAL KNEE ARTHROPLASTY;  Surgeon: Kennieth RadArthur F Carter;  Location: [facility] OR;  Service: Orthopedics;  Laterality: Left;  left total knee arthroplasty   Family History  Problem Relation Age of Onset  . Diabetes Mother   . Hypertension Mother   . Stroke Mother   . Heart attack Neg Hx   . Dementia Father   . Rashes / Skin problems Maternal Grandfather    History  Substance Use Topics  . Smoking status: Former Smoker -- 0.50 packs/day for 20  years    Types: Cigarettes    Quit date: 09/10/1998  . Smokeless tobacco: Not on file  . Alcohol Use: No   OB History    No data available     Review of Systems  Constitutional: Negative for fever and fatigue.  HENT: Negative for congestion and drooling.   Eyes: Negative for pain.  Respiratory: Positive for shortness of breath. Negative for cough.   Cardiovascular: Positive for chest pain.  Gastrointestinal: Negative for nausea, vomiting, abdominal pain and diarrhea.  Genitourinary: Negative for dysuria and hematuria.  Musculoskeletal: Negative for back pain, gait problem and neck pain.  Skin: Negative for color change.  Neurological: Negative for dizziness and headaches.  Hematological: Negative for adenopathy.   Psychiatric/Behavioral: Negative for behavioral problems.  All other systems reviewed and are negative.     Allergies  Ketorolac tromethamine; Atrovent; and Latex  Home Medications   Prior to Admission medications   Medication Sig Start Date End Date Taking? Authorizing Provider  amLODipine (NORVASC) 10 MG tablet Take 1 tablet (10 mg total) by mouth daily. 05/13/14 05/13/15 Yes Ejiroghene Wendall Stade, MD  ibuprofen (ADVIL,MOTRIN) 800 MG tablet Take 1,600 mg by mouth every 8 (eight) hours as needed for moderate pain (pain).   Yes Historical Provider, MD  lisinopril-hydrochlorothiazide (ZESTORETIC) 20-12.5 MG per tablet Take 2 tablets by mouth daily. 05/13/14 05/13/15 Yes Ejiroghene Wendall Stade, MD  methocarbamol (ROBAXIN) 500 MG tablet Take 1 tablet (500 mg total) by mouth 2 (two) times daily as needed for muscle spasms. 11/17/13  Yes Toy Cookey, MD  Elastic Bandages & Supports (LUMBAR BACK BRACE/SUPPORT PAD) MISC 1 Units by Does not apply route as needed. 05/13/14   Ejiroghene Wendall Stade, MD  oxyCODONE-acetaminophen (PERCOCET) 5-325 MG per tablet Take 1 tablet by mouth every 6 (six) hours as needed. 11/17/13   Toy Cookey, MD  pantoprazole (PROTONIX) 20 MG tablet Take 20 mg by mouth daily as needed for heartburn or indigestion. 06/21/13 06/21/14  Genelle Gather, MD  predniSONE (DELTASONE) 20 MG tablet 3 tabs po day one, then 2 po daily x 4 days Patient not taking: Reported on 06/24/2014 11/17/13   Toy Cookey, MD  traMADol (ULTRAM) 50 MG tablet Take 1 tablet (50 mg total) by mouth every 6 (six) hours as needed (pain.). 05/13/14   Ejiroghene E Emokpae, MD   BP 147/90 mmHg  Pulse 76  Temp(Src) 98.3 F (36.8 C) (Oral)  Resp 18  SpO2 99%  LMP 09/09/2009 Physical Exam  Constitutional: She is oriented to person, place, and time. She appears well-developed and well-nourished.  Blood pressure left arm is 143/71. Blood pressure in right arm is 164/83.   HENT:  Head: Normocephalic.   Mouth/Throat: No oropharyngeal exudate.  Eyes: Conjunctivae and EOM are normal. Pupils are equal, round, and reactive to light.  Neck: Normal range of motion. Neck supple.  Cardiovascular: Normal rate, regular rhythm, normal heart sounds and intact distal pulses.  Exam reveals no gallop and no friction rub.   No murmur heard. Pulmonary/Chest: Effort normal and breath sounds normal. No respiratory distress. She has no wheezes.  Abdominal: Soft. Bowel sounds are normal. There is no tenderness. There is no rebound and no guarding.  Musculoskeletal: Normal range of motion. She exhibits no edema or tenderness.  Normal strength in bilateral upper extremities.  2+ distal pulses in all extremities which are equal.  Neurological: She is alert and oriented to person, place, and time.  Skin: Skin is warm and dry.  Psychiatric:  She has a normal mood and affect. Her behavior is normal.  Nursing note and vitals reviewed.   ED Course  Procedures (including critical care time) Labs Review Labs Reviewed  COMPREHENSIVE METABOLIC PANEL - Abnormal; Notable for the following:    Potassium 3.6 (*)    Calcium 10.8 (*)    Alkaline Phosphatase 126 (*)    Total Bilirubin 0.2 (*)    GFR calc non Af Amer 68 (*)    GFR calc Af Amer 79 (*)    All other components within normal limits  D-DIMER, QUANTITATIVE - Abnormal; Notable for the following:    D-Dimer, Quant 0.83 (*)    All other components within normal limits  CBC WITH DIFFERENTIAL  TROPONIN I  TROPONIN I    Imaging Review Dg Chest 2 View  06/24/2014   CLINICAL DATA:  58 year old female with history of midsternal chest pain for the past 3 days. Cough.  EXAM: CHEST  2 VIEW  COMPARISON:  Chest x-ray 07/12/2013.  FINDINGS: Lung volumes are normal. No consolidative airspace disease. No pleural effusions. No pneumothorax. No pulmonary nodule or mass noted. Pulmonary vasculature and the cardiomediastinal silhouette are within normal limits.  IMPRESSION:  No radiographic evidence of acute cardiopulmonary disease.   Electronically Signed   By: Trudie Reedaniel  Entrikin M.D.   On: 06/24/2014 20:13   Ct Angio Chest Pe W/cm &/or Wo Cm  06/25/2014   CLINICAL DATA:  Acute chest pain.  EXAM: CT ANGIOGRAPHY CHEST WITH CONTRAST  TECHNIQUE: Multidetector CT imaging of the chest was performed using the standard protocol during bolus administration of intravenous contrast. Multiplanar CT image reconstructions and MIPs were obtained to evaluate the vascular anatomy.  CONTRAST:  100mL OMNIPAQUE IOHEXOL 350 MG/ML SOLN  COMPARISON:  CT scan of June 24, 2013.  FINDINGS: No pneumothorax or pleural effusion is noted. Stable 7 mm nodule is noted in left lower lobe. Stable 5 mm nodule noted in right lower lobe. No acute pulmonary disease is noted. Thoracic aorta appears normal. There is no definite evidence of pulmonary embolus. Visualized portion of upper abdomen appears normal. No mediastinal mass or adenopathy is noted. No significant osseous abnormality is noted.  Review of the MIP images confirms the above findings.  IMPRESSION: No evidence of pulmonary embolus. Stable bilateral pulmonary nodules compared to prior exam.   Electronically Signed   By: Roque LiasJames  Green M.D.   On: 06/25/2014 00:50     EKG Interpretation   Date/Time:  Tuesday June 24 2014 17:58:15 EST Ventricular Rate:  77 PR Interval:  144 QRS Duration: 81 QT Interval:  441 QTC Calculation: 499 R Axis:   33 Text Interpretation:  Sinus rhythm Borderline prolonged QT interval  non-spec t wave disturbance in V2 which has been seen on previous ecg  (24-Jun-2003) Confirmed by Romeo AppleHARRISON  MD, Damary Doland ([id number]) on 06/24/2014  6:04:05 PM      MDM   Final diagnoses:  Chest pain    6:24 PM 58 y.o. female w hx of HTN, FH heart dis, HLP, previous smoker who presents with gradual onset central chest pain which began 3 days ago while at rest. She describes a burning sharp pain in the center of her chest that  radiates to her shoulders. She notes it to be mildly worse with inspiration. She also has mild shortness of breath. She states that her pain is constant and unwavering. She is afebrile and vital signs are unremarkable here. Low risk per Wells criteria. Will get d-dimer, labs, imaging. Her  pain is atypical in that it is constant for days and not related to exertion.  Ddimer elev, will get CTA.   12:56 AM: Pt now denies cp. I interpreted/reviewed the labs and/or imaging which were non-contributory.  Delta trop neg. Unlikely that this is [place] given constant unwavering cp. Atypical for cardiac.  I have discussed the diagnosis/risks/treatment options with the patient and believe the pt to be eligible for discharge home to follow-up with her pcp for further workup. We also discussed returning to the ED immediately if new or worsening sx occur. We discussed the sx which are most concerning (e.g., worsening cp, sob, fever) that necessitate immediate return. Medications administered to the patient during their visit and any new prescriptions provided to the patient are listed below.  Medications given during this visit Medications  gi cocktail (Maalox,Lidocaine,Donnatal) (30 mLs Oral Given 06/24/14 1843)  HYDROmorphone (DILAUDID) injection 0.5 mg (0.5 mg Intravenous Given 06/24/14 1843)  ondansetron (ZOFRAN) injection 4 mg (4 mg Intravenous Given 06/24/14 2001)  HYDROmorphone (DILAUDID) injection 0.5 mg (0.5 mg Intravenous Given 06/24/14 2002)  iohexol (OMNIPAQUE) 350 MG/ML injection 100 mL (100 mLs Intravenous Contrast Given 06/24/14 2104)    New Prescriptions   OXYCODONE-ACETAMINOPHEN (PERCOCET) 5-325 MG PER TABLET    Take 1 tablet by mouth every 6 (six) hours as needed.     Purvis Sheffield, MD 06/26/14 [phone]

## 2014-06-24 NOTE — ED Notes (Signed)
Pt transported to CT ?

## 2014-06-24 NOTE — ED Notes (Signed)
Pt alert, arrives from home, c/o mid sternal chest pain, onset was Saturday, worse with eating, + SOB, describes pain as sharp, constant, resp even unlabored, skin pwd

## 2014-06-25 MED ORDER — OXYCODONE-ACETAMINOPHEN 5-325 MG PO TABS: 1.0000 | ORAL_TABLET | Freq: Four times a day (QID) | ORAL | Status: DC | PRN

## 2014-06-25 NOTE — Discharge Instructions (Signed)

## 2014-06-25 NOTE — ED Provider Notes (Signed)
Plan is to follow up on CT scan of chest.  If negative DC otherwise treat as indicated.  Holly DibblesJon Lewanda Perea, MD 06/25/14 [phone]

## 2014-07-16 ENCOUNTER — Encounter: Payer: Self-pay | Admitting: Internal Medicine

## 2014-07-16 ENCOUNTER — Ambulatory Visit (INDEPENDENT_AMBULATORY_CARE_PROVIDER_SITE_OTHER): Payer: Medicare Other | Admitting: Internal Medicine

## 2014-07-16 VITALS — BP 128/79 | HR 73 | Temp 98.5°F | Resp 18 | Ht 65.5 in | Wt 324.0 lb

## 2014-07-16 DIAGNOSIS — I10 Essential (primary) hypertension: Secondary | ICD-10-CM | POA: Diagnosis not present

## 2014-07-16 DIAGNOSIS — R202 Paresthesia of skin: Secondary | ICD-10-CM

## 2014-07-16 DIAGNOSIS — E785 Hyperlipidemia, unspecified: Secondary | ICD-10-CM | POA: Diagnosis not present

## 2014-07-16 DIAGNOSIS — R0789 Other chest pain: Secondary | ICD-10-CM | POA: Diagnosis not present

## 2014-07-16 DIAGNOSIS — Z139 Encounter for screening, unspecified: Secondary | ICD-10-CM

## 2014-07-16 LAB — CBC WITH DIFFERENTIAL/PLATELET
Basophils Absolute: 0.1 10*3/uL (ref 0.0–0.1)
Basophils Relative: 1 % (ref 0–1)
Eosinophils Absolute: 0.2 10*3/uL (ref 0.0–0.7)
Eosinophils Relative: 4 % (ref 0–5)
HCT: 39 % (ref 36.0–46.0)
Hemoglobin: 12.4 g/dL (ref 12.0–15.0)
Lymphocytes Relative: 29 % (ref 12–46)
Lymphs Abs: 1.6 10*3/uL (ref 0.7–4.0)
MCH: 27.6 pg (ref 26.0–34.0)
MCHC: 31.8 g/dL (ref 30.0–36.0)
MCV: 86.7 fL (ref 78.0–100.0)
MPV: 9.4 fL (ref 8.6–12.4)
Monocytes Absolute: 0.4 10*3/uL (ref 0.1–1.0)
Monocytes Relative: 8 % (ref 3–12)
Neutro Abs: 3.2 10*3/uL (ref 1.7–7.7)
Neutrophils Relative %: 58 % (ref 43–77)
Platelets: 349 10*3/uL (ref 150–400)
RBC: 4.5 MIL/uL (ref 3.87–5.11)
RDW: 14.1 % (ref 11.5–15.5)
WBC: 5.5 10*3/uL (ref 4.0–10.5)

## 2014-07-16 LAB — COMPREHENSIVE METABOLIC PANEL
ALT: 8 U/L (ref 0–35)
AST: 13 U/L (ref 0–37)
Albumin: 4 g/dL (ref 3.5–5.2)
Alkaline Phosphatase: 119 U/L — ABNORMAL HIGH (ref 39–117)
BUN: 12 mg/dL (ref 6–23)
CO2: 22 mEq/L (ref 19–32)
Calcium: 9.6 mg/dL (ref 8.4–10.5)
Chloride: 106 mEq/L (ref 96–112)
Creat: 0.75 mg/dL (ref 0.50–1.10)
Glucose, Bld: 104 mg/dL — ABNORMAL HIGH (ref 70–99)
Potassium: 4.1 mEq/L (ref 3.5–5.3)
Sodium: 140 mEq/L (ref 135–145)
Total Bilirubin: 0.4 mg/dL (ref 0.2–1.2)
Total Protein: 7 g/dL (ref 6.0–8.3)

## 2014-07-16 LAB — VITAMIN B12: Vitamin B-12: 333 pg/mL (ref 211–911)

## 2014-07-16 LAB — LIPID PANEL
Cholesterol: 212 mg/dL — ABNORMAL HIGH (ref 0–200)
HDL: 40 mg/dL (ref >39)
LDL Cholesterol: 155 mg/dL — ABNORMAL HIGH (ref 0–99)
Total CHOL/HDL Ratio: 5.3 Ratio
Triglycerides: 87 mg/dL (ref <150)
VLDL: 17 mg/dL (ref 0–40)

## 2014-07-16 LAB — TSH: TSH: 1.307 u[IU]/mL (ref 0.350–4.500)

## 2014-07-16 MED ORDER — NYSTATIN-TRIAMCINOLONE 100000-0.1 UNIT/GM-% EX OINT: 1.0000 "application " | TOPICAL_OINTMENT | Freq: Two times a day (BID) | CUTANEOUS | Status: DC

## 2014-07-16 MED ORDER — HYDROCODONE-HOMATROPINE 5-1.5 MG/5ML PO SYRP: 5.0000 mL | ORAL_SOLUTION | Freq: Three times a day (TID) | ORAL | Status: DC | PRN

## 2014-07-16 MED ORDER — AZITHROMYCIN 250 MG PO TABS: ORAL_TABLET | ORAL | Status: DC

## 2014-07-16 NOTE — Patient Instructions (Signed)
Give 30 min follow up in 2-3 weeks

## 2014-07-16 NOTE — Progress Notes (Signed)
Subjective:    Patient ID: [name], female    DOB: 07/01/1956, 59 y.o.   MRN: [id number]  HPI   12/15 ER note Chest pain    6:24 PM 59 y.o. female w hx of HTN, FH heart dis, HLP, previous smoker who presents with gradual onset central chest pain which began 3 days ago while at rest. She describes a burning sharp pain in the center of her chest that radiates to her shoulders. She notes it to be mildly worse with inspiration. She also has mild shortness of breath. She states that her pain is constant and unwavering. She is afebrile and vital signs are unremarkable here. Low risk per Wells criteria. Will get d-dimer, labs, imaging. Her pain is atypical in that it is constant for days and not related to exertion.  Ddimer elev, will get CTA.   12:56 AM: Pt now denies cp. I interpreted/reviewed the labs and/or imaging which were non-contributory. Delta trop neg. Unlikely that this is [place] given constant unwavering cp. Atypical for cardiac. I have discussed the diagnosis/risks/treatment options with the patient and believe the pt to be eligible for discharge home to follow-up with her pcp for further workup. We also discussed returning to the ED immediately if new or worsening sx occur. We discussed the sx which are most concerning (e.g., worsening cp, sob, fever) that necessitate immediate return. Medications administered to the patient during their visit and any new prescriptions provided to the patient are listed below.  TODAY:  [name] is a new pt here for first visit to establish primary care  PMH of asthma,  HTN, obesity,  Hyperlipidemia DJD knees s/p left TKR,   And  Lumbar radiculopathy .  Former care IM PO clinic  She also report she had cervical conization many years ago .  Last pap neg but no transformation zone    See ER note above when pt presented with chest pain.   Not felt to be cardiac and D-dimer slight elevation but Ct angio neg Pt does not report any chest pain since  ER visit.   See CT imaging of abd /pelvis   Of 05/19/2014  Lower lung nodules appear stable for the past two years  CT angio of 12/15 neg for PE and stable nodules  Pt is concerned about numbness and tingling  Bilateral hands and feet.  Mother is diabetic and she is concerneda about this.  [name] has not had labs in over a year and is due for mm  Describes burning sensation hands and feet for the past 6 months.     Allergies  Allergen Reactions  . Ketorolac Tromethamine Shortness Of Breath and Palpitations  . Atrovent [Ipratropium] Hives and Rash  . Latex Other (See Comments)    Powdered. Confirm type of reaction with patient.   Past Medical History  Diagnosis Date  . Hypertension   . Hyperlipidemia   . Asthma   . Obesity   . Lower extremity edema     Chronic. 2D echo (2005) - EF 65%.  . Seasonal allergies   . Abnormal EKG 06/2003    History of inverted T waves V1-V3. Normal 2D echo (07/23/2003): LVEF 65%.  . History of multiple pulmonary nodules     Incidental finding: CT Abd/ Pelvis (04/2010) - Several small lower lobe lung nodules, including one pure ground-glass pulmonary nodule measuring 8 mm in the left lower lobe.  Recommend follow-up chest CT (IV contrast preferred) in 6 months to document stability. //  CT Abd/ Pelvis (07/2010) -  3 mm RLL and  8 mm LLL nodule stable.  Other nodules unchanged, likely benign.  . 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 Followed by Sports Medicine, Dr. Jennette Kettle.  . Incarcerated ventral hernia 04/2010    Noted on CT Abd/ pelvis (04/2010). Patient now s/p ventral hernia repair by Dr. Gerrit Friends (12/2010)  . Anemia     BL Hgb 11-12. Ferritin 14 - low normal (08/2007). Colonoscopy 2009 - external hemorrhoids (excellent prep). Last anemia panel (12/2010) - Iron  24, TIBC 269,  B12  316, Folate 11.9, Ferritin 73.  . Wears dentures   . Wears glasses   . Knee osteoarthritis     s/p Left total knee replacement (06/2011)   Past Surgical  History  Procedure Laterality Date  . Incision and drainage abscess anal  07/2008    I&D and debridgement of anorectal abscess  . Inguinal hernia repair    . Tubal ligation    . Incisional hernia repair  12/2010    Repair of incarcerated ventral incisional hernia with Ethicon mesh patch - performed by Dr. Gerrit Friends.   . Foot surgery  2004     left  . Total knee arthroplasty  06/16/2011    Left TOTAL KNEE ARTHROPLASTY;  Surgeon: Kennieth Rad;  Location: [facility] OR;  Service: Orthopedics;  Laterality: Left;  left total knee arthroplasty  . Joint replacement Left 2013    left knee   History   Social History  . Marital Status: Married    Spouse Name: N/A    Number of Children: N/A  . Years of Education: N/A   Occupational History  . Not on file.   Social History Main Topics  . Smoking status: Former Smoker -- 0.50 packs/day for 20 years    Types: Cigarettes    Quit date: 09/10/1998  . Smokeless tobacco: Never Used  . Alcohol Use: No  . Drug Use: No  . Sexual Activity:    Partners: Male   Other Topics Concern  . Not on file   Social History Narrative   Family History  Problem Relation Age of Onset  . Diabetes Mother   . Hypertension Mother   . Stroke Mother   . Heart attack Neg Hx   . Dementia Father   . Heart disease Father   . Rashes / Skin problems Maternal Grandfather   . Hypertension Sister   . Diabetes Brother   . Hypertension Brother    Patient Active Problem List   Diagnosis Date Noted  . Sciatica neuralgia 07/22/2013  . Breast pain, left 06/21/2013  . Abdominal pain, left lower quadrant 04/09/2013  . Infection of urinary tract 03/06/2013  . Headache(784.0) 11/02/2012  . Vertigo 11/02/2012  . Morbid obesity with BMI of 50.0-59.9, adult 11/21/2011  . Leg pain, bilateral 03/24/2011  . Incisional hernia, incarcerated 01/19/2011  . Preventative health care 09/10/2010  . LUNG NODULE 05/10/2010  . BACK PAIN 01/20/2009  . DEGENERATIVE JOINT DISEASE 06/02/2008   . HYPERLIPIDEMIA 08/15/2006  . ANEMIA-NOS 08/15/2006  . HYPERTENSION 08/11/2006  . ASTHMA 08/11/2006   Current Outpatient Prescriptions on File Prior to Visit  Medication Sig Dispense Refill  . ibuprofen (ADVIL,MOTRIN) 800 MG tablet Take 1,600 mg by mouth every 8 (eight) hours as needed for moderate pain (pain).    [name] lisinopril-hydrochlorothiazide (ZESTORETIC) 20-12.5 MG per tablet Take 2 tablets by mouth daily. 60 tablet 11  . methocarbamol (ROBAXIN) 500 MG tablet  Take 1 tablet (500 mg total) by mouth 2 (two) times daily as needed for muscle spasms. 20 tablet 0  . oxyCODONE-acetaminophen (PERCOCET) 5-325 MG per tablet Take 1 tablet by mouth every 6 (six) hours as needed. 15 tablet 0  . traMADol (ULTRAM) 50 MG tablet Take 1 tablet (50 mg total) by mouth every 6 (six) hours as needed (pain.). 30 tablet 0   No current facility-administered medications on file prior to visit.      Review of Systems See HPI    Objective:   Physical Exam  Physical Exam  Nursing note and vitals reviewed.  Constitutional: She is oriented to person, place, and time. She appears well-developed and well-nourished.  HENT:  Head: Normocephalic and atraumatic.  Cardiovascular: Normal rate and regular rhythm. Exam reveals no gallop and no friction rub.  No murmur heard.  Pulmonary/Chest: Breath sounds normal. She has no wheezes. She has no rales.  Neurological: She is alert and oriented to person, place, and time.  Skin: Skin is warm and dry.  EXT  Strong bilateral PP  Scaly rash web spaces right foot Psychiatric: She has a normal mood and affect. Her behavior is normal.         Assessment & Plan:  Dysesthesias / paresthesias hands and feet  Will get B12,  CBC, ANA and chemistries etiology unclear  HTN  Continue meds check all labs  Hyperlipidemia  Check todaY  Chest pain  Etiology not felt to be cardiac or pulmonary ER eval 12/ 2015    No further episodes now   DJD    Lumbar radiculopathy   Followed by sports medicine  Lung nodules  See CT of abd pelvis  No change in 2 years felt to be benign   See me in 2-3 weeks

## 2014-07-17 LAB — ANTI-NUCLEAR AB-TITER (ANA TITER): ANA Titer 1: NEGATIVE

## 2014-07-17 LAB — ANA: Anti Nuclear Antibody(ANA): POSITIVE — AB

## 2014-07-24 ENCOUNTER — Telehealth: Payer: Self-pay | Admitting: *Deleted

## 2014-07-24 NOTE — Telephone Encounter (Signed)
[name] is aware of her test results. She was advised to keep her next appointment-eh

## 2014-07-24 NOTE — Telephone Encounter (Signed)
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  ADvise to be sure to keep her follow up appt with me later this month and I will go over these results with her  OK to mail ot pt

## 2014-08-06 ENCOUNTER — Encounter: Payer: Self-pay | Admitting: Internal Medicine

## 2014-08-06 ENCOUNTER — Encounter: Payer: Self-pay | Admitting: Family Medicine

## 2014-08-06 ENCOUNTER — Ambulatory Visit (HOSPITAL_BASED_OUTPATIENT_CLINIC_OR_DEPARTMENT_OTHER)
Admission: RE | Admit: 2014-08-06 | Discharge: 2014-08-06 | Disposition: A | Discharge status: Home / Self Care | Payer: Medicare Other | Source: Ambulatory Visit | Attending: Internal Medicine | Admitting: Internal Medicine

## 2014-08-06 ENCOUNTER — Telehealth: Payer: Self-pay | Admitting: *Deleted

## 2014-08-06 ENCOUNTER — Ambulatory Visit (INDEPENDENT_AMBULATORY_CARE_PROVIDER_SITE_OTHER): Payer: Medicare Other | Admitting: Family Medicine

## 2014-08-06 ENCOUNTER — Ambulatory Visit (INDEPENDENT_AMBULATORY_CARE_PROVIDER_SITE_OTHER): Payer: Medicare Other | Admitting: Internal Medicine

## 2014-08-06 VITALS — BP 156/78 | HR 64 | Resp 16 | Ht 65.5 in | Wt 320.0 lb

## 2014-08-06 VITALS — BP 146/92 | HR 77 | Ht 65.0 in | Wt 322.0 lb

## 2014-08-06 DIAGNOSIS — R748 Abnormal levels of other serum enzymes: Secondary | ICD-10-CM | POA: Diagnosis not present

## 2014-08-06 DIAGNOSIS — M5441 Lumbago with sciatica, right side: Secondary | ICD-10-CM | POA: Diagnosis not present

## 2014-08-06 DIAGNOSIS — E785 Hyperlipidemia, unspecified: Secondary | ICD-10-CM

## 2014-08-06 DIAGNOSIS — R05 Cough: Secondary | ICD-10-CM

## 2014-08-06 DIAGNOSIS — R059 Cough, unspecified: Secondary | ICD-10-CM

## 2014-08-06 DIAGNOSIS — M5442 Lumbago with sciatica, left side: Secondary | ICD-10-CM

## 2014-08-06 DIAGNOSIS — J40 Bronchitis, not specified as acute or chronic: Secondary | ICD-10-CM

## 2014-08-06 LAB — GAMMA GT: GGT: 15 U/L (ref 7–51)

## 2014-08-06 MED ORDER — HYDROCODONE-HOMATROPINE 5-1.5 MG/5ML PO SYRP: 5.0000 mL | ORAL_SOLUTION | Freq: Three times a day (TID) | ORAL | Status: DC | PRN

## 2014-08-06 MED ORDER — CEFTRIAXONE SODIUM 1 G IJ SOLR
1.0000 g | Freq: Once | INTRAMUSCULAR | Status: AC
  Administered 2014-08-06: 1 g via INTRAMUSCULAR

## 2014-08-06 MED ORDER — ALBUTEROL SULFATE HFA 108 (90 BASE) MCG/ACT IN AERS: INHALATION_SPRAY | RESPIRATORY_TRACT | Status: DC

## 2014-08-06 NOTE — Telephone Encounter (Signed)
Patient aware of her CXR results

## 2014-08-06 NOTE — Patient Instructions (Signed)
We will go ahead with an injection where you have the disc bulge in your back. Call me 1-2 weeks following this to let me know how you're doing. Ok to take ibuprofen 600mg  three times a day OR aleve 2 tabs twice a day with food for pain and inflammation. Muscle relaxants are a consideration but usually don't give long lasting benefits. Physical therapy is likely to be beneficial as well - let me know if you want to do this.

## 2014-08-06 NOTE — Patient Instructions (Signed)
Pt to follow in 10 days 30 min

## 2014-08-06 NOTE — Telephone Encounter (Signed)
-----   Message from Kendrick Rancheborah D Schoenhoff, MD sent at 08/06/2014 12:54 PM EST ----- Call pt and let her know there is no pneumonia on her CXR  Advise her to keep follow up appt with me

## 2014-08-06 NOTE — Progress Notes (Signed)
Subjective:    Patient ID: [name], female    DOB: April 26, 1956, 59 y.o.   MRN: [id number]  HPI  07/16/2014 note Dysesthesias / paresthesias hands and feet Will get B12, CBC, ANA and chemistries etiology unclear  HTN Continue meds check all labs  Hyperlipidemia Check todaY  Chest pain Etiology not felt to be cardiac or pulmonary ER eval 12/ 2015 No further episodes now   DJD   Lumbar radiculopathy Followed by sports medicine  Lung nodules See CT of abd pelvis No change in 2 years felt to be benign   See me in 2-3 weeks         TODAY: [name] is here for follow up.    Paresthesias hands/feet.  Pt reports she has not had any subjective numbness since our last visit.  Labs unrevealing  Exposure  to grand-daughter with resp illness.  Pt coughing yellow sputum  Posterior thoracic chest pain no SOB no fever  See alk phos  Pt is asymptomatic.  She did have CT abd with contrast done 05/2014 which showed unremarkable liver and no biliary ductal dilation   Hyperlipidemia  Pt does not wish any RX meds at this point   Allergies  Allergen Reactions  . Ketorolac Tromethamine Shortness Of Breath and Palpitations  . Atrovent [Ipratropium] Hives and Rash  . Latex Other (See Comments)    Powdered. Confirm type of reaction with patient.   Past Medical History  Diagnosis Date  . Hypertension   . Hyperlipidemia   . Asthma   . Obesity   . Lower extremity edema     Chronic. 2D echo (2005) - EF 65%.  . Seasonal allergies   . Abnormal EKG 06/2003    History of inverted T waves V1-V3. Normal 2D echo (07/23/2003): LVEF 65%.  . History of multiple pulmonary nodules     Incidental finding: CT Abd/ Pelvis (04/2010) - Several small lower lobe lung nodules, including one pure ground-glass pulmonary nodule measuring 8 mm in the left lower lobe.  Recommend follow-up chest CT (IV contrast preferred) in 6 months to document stability. //  CT Abd/ Pelvis (07/2010) -  3 mm RLL  and  8 mm LLL nodule stable.  Other nodules unchanged, likely benign.  . 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 Followed by Sports Medicine, Dr. Nori Riis.  . Incarcerated ventral hernia 04/2010    Noted on CT Abd/ pelvis (04/2010). Patient now s/p ventral hernia repair by Dr. Harlow Asa (12/2010)  . Anemia     BL Hgb 11-12. Ferritin 14 - low normal (08/2007). Colonoscopy 2009 - external hemorrhoids (excellent prep). Last anemia panel (12/2010) - Iron  24, TIBC 269,  B12  316, Folate 11.9, Ferritin 73.  . Wears dentures   . Wears glasses   . Knee osteoarthritis     s/p Left total knee replacement (06/2011)   Past Surgical History  Procedure Laterality Date  . Incision and drainage abscess anal  07/2008    I&D and debridgement of anorectal abscess  . Inguinal hernia repair    . Tubal ligation    . Incisional hernia repair  12/2010    Repair of incarcerated ventral incisional hernia with Ethicon mesh patch - performed by Dr. Harlow Asa.   . Foot surgery  2004     left  . Total knee arthroplasty  06/16/2011    Left TOTAL KNEE ARTHROPLASTY;  Surgeon: Sharmon Revere;  Location: [facility];  Service: Orthopedics;  Laterality: Left;  left total knee arthroplasty  . Joint replacement Left 2013    left knee   History   Social History  . Marital Status: Married    Spouse Name: N/A    Number of Children: N/A  . Years of Education: N/A   Occupational History  . Not on file.   Social History Main Topics  . Smoking status: Former Smoker -- 0.50 packs/day for 20 years    Types: Cigarettes    Quit date: 09/10/1998  . Smokeless tobacco: Never Used  . Alcohol Use: No  . Drug Use: No  . Sexual Activity:    Partners: Male   Other Topics Concern  . Not on file   Social History Narrative   Family History  Problem Relation Age of Onset  . Diabetes Mother   . Hypertension Mother   . Stroke Mother   . Heart attack Neg Hx   . Dementia Father   . Heart disease Father   . Rashes  / Skin problems Maternal Grandfather   . Hypertension Sister   . Diabetes Brother   . Hypertension Brother    Patient Active Problem List   Diagnosis Date Noted  . Sciatica neuralgia 07/22/2013  . Breast pain, left 06/21/2013  . Abdominal pain, left lower quadrant 04/09/2013  . Infection of urinary tract 03/06/2013  . Headache(784.0) 11/02/2012  . Vertigo 11/02/2012  . Morbid obesity with BMI of 50.0-59.9, adult 11/21/2011  . Leg pain, bilateral 03/24/2011  . Incisional hernia, incarcerated 01/19/2011  . Preventative health care 09/10/2010  . LUNG NODULE 05/10/2010  . BACK PAIN 01/20/2009  . DEGENERATIVE JOINT DISEASE 06/02/2008  . HYPERLIPIDEMIA 08/15/2006  . ANEMIA-NOS 08/15/2006  . HYPERTENSION 08/11/2006  . ASTHMA 08/11/2006   Current Outpatient Prescriptions on File Prior to Visit  Medication Sig Dispense Refill  . albuterol (PROVENTIL) (2.5 MG/3ML) 0.083% nebulizer solution Take 2.5 mg by nebulization every 6 (six) hours as needed for wheezing or shortness of breath.    [name] azithromycin (ZITHROMAX) 250 MG tablet Take as directed 6 tablet 0  . HYDROcodone-homatropine (HYCODAN) 5-1.5 MG/5ML syrup Take 5 mLs by mouth every 8 (eight) hours as needed for cough. 120 mL 0  . ibuprofen (ADVIL,MOTRIN) 800 MG tablet Take 1,600 mg by mouth every 8 (eight) hours as needed for moderate pain (pain).    [name] lisinopril-hydrochlorothiazide (ZESTORETIC) 20-12.5 MG per tablet Take 2 tablets by mouth daily. 60 tablet 11  . methocarbamol (ROBAXIN) 500 MG tablet Take 1 tablet (500 mg total) by mouth 2 (two) times daily as needed for muscle spasms. 20 tablet 0  . nystatin-triamcinolone ointment (MYCOLOG) Apply 1 application topically 2 (two) times daily. 30 g 0  . oxyCODONE-acetaminophen (PERCOCET) 5-325 MG per tablet Take 1 tablet by mouth every 6 (six) hours as needed. 15 tablet 0  . traMADol (ULTRAM) 50 MG tablet Take 1 tablet (50 mg total) by mouth every 6 (six) hours as needed (pain.). 30 tablet 0    No current facility-administered medications on file prior to visit.      Review of Systems See HPI    Objective:   Physical Exam  Physical Exam  Nursing note and vitals reviewed.  Constitutional: She is oriented to person, place, and time. She appears well-developed and well-nourished. She is cooperative.  HENT:  Head: Normocephalic and atraumatic.  Nose: Mucosal edema present.  Eyes: Conjunctivae and EOM are normal. Pupils are equal, round, and reactive to light.  Neck: Neck supple.  Cardiovascular:  Regular rhythm, normal heart sounds, intact distal pulses and normal pulses. Exam reveals no gallop and no friction rub.  No murmur heard.  Pulmonary/Chest: She has no wheezes. She has rhonchi. She has no rales.  Neurological: She is alert and oriented to person, place, and time.  Skin: Skin is warm and dry. No abrasion, no bruising, no ecchymosis and no rash noted. No cyanosis. Nails show no clubbing.  Psychiatric: She has a normal mood and affect. Her speech is normal and behavior is normal.          Assessment & Plan:  Recurrent bronchitis  Will get CXR today Rocephin 1 gm inoffice    RX for albuterol MDI q 8 h prn.  Hycodan cpugh syrup  hperlipidemia  She doe not wish RX meds  Minimally elevated alk phos  Will check GGT,  AMA.   Reassuring that liver CT neg .   She does have history of arthritis

## 2014-08-07 LAB — MITOCHONDRIAL ANTIBODIES: Mitochondrial M2 Ab, IgG: 0.62 (ref <0.91)

## 2014-08-08 ENCOUNTER — Other Ambulatory Visit: Payer: Self-pay | Admitting: Family Medicine

## 2014-08-08 DIAGNOSIS — M5416 Radiculopathy, lumbar region: Secondary | ICD-10-CM

## 2014-08-11 ENCOUNTER — Ambulatory Visit
Admission: RE | Admit: 2014-08-11 | Discharge: 2014-08-11 | Disposition: A | Discharge status: Home / Self Care | Payer: Medicare Other | Source: Ambulatory Visit | Attending: Family Medicine | Admitting: Family Medicine

## 2014-08-11 DIAGNOSIS — M5416 Radiculopathy, lumbar region: Secondary | ICD-10-CM

## 2014-08-11 DIAGNOSIS — M5126 Other intervertebral disc displacement, lumbar region: Secondary | ICD-10-CM | POA: Diagnosis not present

## 2014-08-11 MED ORDER — METHYLPREDNISOLONE ACETATE 40 MG/ML INJ SUSP (RADIOLOG
120.0000 mg | Freq: Once | INTRAMUSCULAR | Status: AC
  Administered 2014-08-11: 120 mg via EPIDURAL

## 2014-08-11 MED ORDER — IOHEXOL 180 MG/ML  SOLN
1.0000 mL | Freq: Once | INTRAMUSCULAR | Status: AC | PRN
  Administered 2014-08-11: 1 mL via EPIDURAL

## 2014-08-11 NOTE — Discharge Instructions (Signed)

## 2014-08-11 NOTE — Assessment & Plan Note (Signed)
Most consistent with lumbar radiculopathy (low back pain, radiates down leg into groin).  Less likely sciatica.  Did not improve with home exercises or prednisone dose pack.  MRI from 11/2013 showed a far central disc protrusion at L3-4 with mild foraminal narrowing and an annular tear at L4-5.  As she's not improving and has some radiation into right side advised we trial an ESI at L3-4.  Consider formal physical therapy as well.  Patient to call 1-2 weeks after injection to let us know how she's doing.

## 2014-08-11 NOTE — Progress Notes (Signed)
Patient ID: [name], female   DOB: [birth year], 59 y.o.   MRN: [id number]  PCP: Levon HedgerSCHOENHOFF,DEBBIE, MD  Subjective:   HPI: Patient is a 59 y.o. female here for low back pain.  08/01/13: Patient reports for about 2 weeks she has had pain in right side of low back radiating into right groin. Also going down right leg. Some pain into left groin. Describes pain as a constant aching. No numbness/tingling regularly - right leg occasionally will fall asleep with certain positions. No bowel/bladder dysfunction. Tried ibuprofen and pain medication.  08/06/14: Patient reports her back pain has worsened since her last visit. She has done home exercises since we last saw her without much benefit. Prednisone also did not help. Pain is in mid low back. No injury or trauma since last visit. Sometimes will go into right leg or left groin. Was recommended to do physical therapy but she did not pursue this. No bowel/bladder dysfunction.  Past Medical History  Diagnosis Date  . Hypertension   . Hyperlipidemia   . Asthma   . Obesity   . Lower extremity edema     Chronic. 2D echo (2005) - EF 65%.  . Seasonal allergies   . Abnormal EKG 06/2003    History of inverted T waves V1-V3. Normal 2D echo (07/23/2003): LVEF 65%.  . History of multiple pulmonary nodules     Incidental finding: CT Abd/ Pelvis (04/2010) - Several small lower lobe lung nodules, including one pure ground-glass pulmonary nodule measuring 8 mm in the left lower lobe.  Recommend follow-up chest CT (IV contrast preferred) in 6 months to document stability. //  CT Abd/ Pelvis (07/2010) -  3 mm RLL and  8 mm LLL nodule stable.  Other nodules unchanged, likely benign.  . 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 Followed by Sports Medicine, Dr. Jennette KettleNeal.  . Incarcerated ventral hernia 04/2010    Noted on CT Abd/ pelvis (04/2010). Patient now s/p ventral hernia repair by Dr. Gerrit FriendsGerkin (12/2010)  . Anemia     BL Hgb  11-12. Ferritin 14 - low normal (08/2007). Colonoscopy 2009 - external hemorrhoids (excellent prep). Last anemia panel (12/2010) - Iron  24, TIBC 269,  B12  316, Folate 11.9, Ferritin 73.  . Wears dentures   . Wears glasses   . Knee osteoarthritis     s/p Left total knee replacement (06/2011)    Current Outpatient Prescriptions on File Prior to Visit  Medication Sig Dispense Refill  . albuterol (PROVENTIL HFA;VENTOLIN HFA) 108 (90 BASE) MCG/ACT inhaler Inhale 1 Inhaler 2  . HYDROcodone-homatropine (HYCODAN) 5-1.5 MG/5ML syrup Take 5 mLs by mouth every 8 (eight) hours as needed for cough. 240 mL 0  . ibuprofen (ADVIL,MOTRIN) 800 MG tablet Take 1,600 mg by mouth every 8 (eight) hours as needed for moderate pain (pain).    [name]. lisinopril-hydrochlorothiazide (ZESTORETIC) 20-12.5 MG per tablet Take 2 tablets by mouth daily. 60 tablet 11  . methocarbamol (ROBAXIN) 500 MG tablet Take 1 tablet (500 mg total) by mouth 2 (two) times daily as needed for muscle spasms. 20 tablet 0  . nystatin-triamcinolone ointment (MYCOLOG) Apply 1 application topically 2 (two) times daily. 30 g 0   No current facility-administered medications on file prior to visit.    Past Surgical History  Procedure Laterality Date  . Incision and drainage abscess anal  07/2008    I&D and debridgement of anorectal abscess  . Inguinal hernia repair    .  Tubal ligation    . Incisional hernia repair  12/2010    Repair of incarcerated ventral incisional hernia with Ethicon mesh patch - performed by Dr. Gerrit Friends.   . Foot surgery  2004     left  . Total knee arthroplasty  06/16/2011    Left TOTAL KNEE ARTHROPLASTY;  Surgeon: Kennieth Rad;  Location: [facility];  Service: Orthopedics;  Laterality: Left;  left total knee arthroplasty  . Joint replacement Left 2013    left knee    Allergies  Allergen Reactions  . Ketorolac Tromethamine Shortness Of Breath and Palpitations  . Atrovent [Ipratropium] Hives and Rash  . Latex Other (See  Comments)    Powdered. Confirm type of reaction with patient.    History   Social History  . Marital Status: Married    Spouse Name: N/A    Number of Children: N/A  . Years of Education: N/A   Occupational History  . Not on file.   Social History Main Topics  . Smoking status: Former Smoker -- 0.50 packs/day for 20 years    Types: Cigarettes    Quit date: 09/10/1998  . Smokeless tobacco: Never Used  . Alcohol Use: No  . Drug Use: No  . Sexual Activity:    Partners: Male   Other Topics Concern  . Not on file   Social History Narrative    Family History  Problem Relation Age of Onset  . Diabetes Mother   . Hypertension Mother   . Stroke Mother   . Heart attack Neg Hx   . Dementia Father   . Heart disease Father   . Rashes / Skin problems Maternal Grandfather   . Hypertension Sister   . Diabetes Brother   . Hypertension Brother     BP 146/92 mmHg  Pulse 77  Ht  (1.651 m)  Wt 322 lb (146.058 kg)  BMI 53.58 kg/m2  LMP 09/09/2009  Review of Systems: See HPI above.    Objective:  Physical Exam:  Gen: NAD, obese.  Back: No gross deformity, scoliosis. TTP right paraspinal lumbar region, buttock, lateral hip .  No midline or bony TTP. FROM with pain on flexion. Strength LEs 5/5 all muscle groups.   Trace MSRs in patellar and achilles tendons, equal bilaterally. Negative SLRs.  Poor hamstring flexibility. Sensation intact to light touch bilaterally. Negative logroll bilateral hips    Assessment & Plan:  1. Low back pain - Most consistent with lumbar radiculopathy (low back pain, radiates down leg into groin).  Less likely sciatica.  Did not improve with home exercises or prednisone dose pack.  MRI from 11/2013 showed a far central disc protrusion at L3-4 with mild foraminal narrowing and an annular tear at L4-5.  As she's not improving and has some radiation into right side advised we trial an ESI at L3-4.  Consider formal physical therapy as well.   Patient to call 1-2 weeks after injection to let us know how she's doing.

## 2014-08-12 ENCOUNTER — Ambulatory Visit (HOSPITAL_BASED_OUTPATIENT_CLINIC_OR_DEPARTMENT_OTHER): Payer: Medicare Other

## 2014-08-18 ENCOUNTER — Ambulatory Visit: Payer: Medicare Other | Admitting: Internal Medicine

## 2014-08-18 ENCOUNTER — Ambulatory Visit: Payer: Medicare Other

## 2014-08-20 ENCOUNTER — Telehealth: Payer: Self-pay | Admitting: *Deleted

## 2014-08-20 NOTE — Telephone Encounter (Signed)
-----   Message from Kendrick Rancheborah D Schoenhoff, MD sent at 08/20/2014 12:56 PM EST ----- [name]  Call pt and let her know that her blood tests for any liver problem are in the normal range.  Give her a 30 min appt for follow up with me in 1-2 weeks  OK to mail labs to her   I placed on your desk   Route back with appt time

## 2014-08-20 NOTE — Telephone Encounter (Signed)
I spoke with [name] about her liver test. I also mail her a copy of the labs. [facility]

## 2014-08-23 ENCOUNTER — Emergency Department (HOSPITAL_COMMUNITY)
Admission: EM | Admit: 2014-08-23 | Discharge: 2014-08-23 | Disposition: A | Discharge status: Home / Self Care | Payer: Medicare Other | Attending: Emergency Medicine | Admitting: Emergency Medicine

## 2014-08-23 ENCOUNTER — Encounter (HOSPITAL_COMMUNITY): Payer: Self-pay | Admitting: *Deleted

## 2014-08-23 ENCOUNTER — Emergency Department (HOSPITAL_COMMUNITY): Payer: Medicare Other

## 2014-08-23 DIAGNOSIS — Z862 Personal history of diseases of the blood and blood-forming organs and certain disorders involving the immune mechanism: Secondary | ICD-10-CM | POA: Diagnosis not present

## 2014-08-23 DIAGNOSIS — R062 Wheezing: Secondary | ICD-10-CM | POA: Diagnosis not present

## 2014-08-23 DIAGNOSIS — M549 Dorsalgia, unspecified: Secondary | ICD-10-CM | POA: Diagnosis not present

## 2014-08-23 DIAGNOSIS — Z79899 Other long term (current) drug therapy: Secondary | ICD-10-CM | POA: Diagnosis not present

## 2014-08-23 DIAGNOSIS — M179 Osteoarthritis of knee, unspecified: Secondary | ICD-10-CM | POA: Insufficient documentation

## 2014-08-23 DIAGNOSIS — J40 Bronchitis, not specified as acute or chronic: Secondary | ICD-10-CM

## 2014-08-23 DIAGNOSIS — E669 Obesity, unspecified: Secondary | ICD-10-CM | POA: Diagnosis not present

## 2014-08-23 DIAGNOSIS — Z91040 Latex allergy status: Secondary | ICD-10-CM | POA: Diagnosis not present

## 2014-08-23 DIAGNOSIS — I10 Essential (primary) hypertension: Secondary | ICD-10-CM | POA: Diagnosis not present

## 2014-08-23 DIAGNOSIS — J4541 Moderate persistent asthma with (acute) exacerbation: Secondary | ICD-10-CM | POA: Diagnosis not present

## 2014-08-23 DIAGNOSIS — R079 Chest pain, unspecified: Secondary | ICD-10-CM | POA: Diagnosis not present

## 2014-08-23 DIAGNOSIS — Z87891 Personal history of nicotine dependence: Secondary | ICD-10-CM | POA: Insufficient documentation

## 2014-08-23 LAB — COMPREHENSIVE METABOLIC PANEL
ALT: 10 U/L (ref 0–35)
AST: 14 U/L (ref 0–37)
Albumin: 3.5 g/dL (ref 3.5–5.2)
Alkaline Phosphatase: 110 U/L (ref 39–117)
Anion gap: 10 (ref 5–15)
BUN: 6 mg/dL (ref 6–23)
CO2: 20 mmol/L (ref 19–32)
Calcium: 9.2 mg/dL (ref 8.4–10.5)
Chloride: 108 mmol/L (ref 96–112)
Creatinine, Ser: 0.78 mg/dL (ref 0.50–1.10)
GFR calc Af Amer: >90 mL/min (ref >90)
GFR calc non Af Amer: >90 mL/min (ref >90)
Glucose, Bld: 101 mg/dL — ABNORMAL HIGH (ref 70–99)
Potassium: 3.9 mmol/L (ref 3.5–5.1)
Sodium: 138 mmol/L (ref 135–145)
Total Bilirubin: 0.8 mg/dL (ref 0.3–1.2)
Total Protein: 7 g/dL (ref 6.0–8.3)

## 2014-08-23 LAB — CBC WITH DIFFERENTIAL/PLATELET
Basophils Absolute: 0 10*3/uL (ref 0.0–0.1)
Basophils Relative: 1 % (ref 0–1)
Eosinophils Absolute: 0.2 10*3/uL (ref 0.0–0.7)
Eosinophils Relative: 4 % (ref 0–5)
HCT: 37.9 % (ref 36.0–46.0)
Hemoglobin: 11.9 g/dL — ABNORMAL LOW (ref 12.0–15.0)
Lymphocytes Relative: 36 % (ref 12–46)
Lymphs Abs: 1.5 10*3/uL (ref 0.7–4.0)
MCH: 27.9 pg (ref 26.0–34.0)
MCHC: 31.4 g/dL (ref 30.0–36.0)
MCV: 88.8 fL (ref 78.0–100.0)
Monocytes Absolute: 0.5 10*3/uL (ref 0.1–1.0)
Monocytes Relative: 11 % (ref 3–12)
Neutro Abs: 2 10*3/uL (ref 1.7–7.7)
Neutrophils Relative %: 48 % (ref 43–77)
Platelets: 287 10*3/uL (ref 150–400)
RBC: 4.27 MIL/uL (ref 3.87–5.11)
RDW: 14.3 % (ref 11.5–15.5)
WBC: 4.2 10*3/uL (ref 4.0–10.5)

## 2014-08-23 MED ORDER — ALBUTEROL SULFATE (2.5 MG/3ML) 0.083% IN NEBU
2.5000 mg | INHALATION_SOLUTION | Freq: Once | RESPIRATORY_TRACT | Status: AC
Start: 2014-08-23 — End: 2014-08-23
  Administered 2014-08-23: 2.5 mg via RESPIRATORY_TRACT
  Filled 2014-08-23: qty 3

## 2014-08-23 MED ORDER — METHYLPREDNISOLONE SODIUM SUCC 125 MG IJ SOLR
125.0000 mg | Freq: Once | INTRAMUSCULAR | Status: AC
  Administered 2014-08-23: 125 mg via INTRAVENOUS
  Filled 2014-08-23: qty 2

## 2014-08-23 MED ORDER — HYDROCODONE-ACETAMINOPHEN 5-325 MG PO TABS
1.0000 | ORAL_TABLET | Freq: Four times a day (QID) | ORAL | Status: DC | PRN
Start: 2014-08-23 — End: 2014-10-13

## 2014-08-23 MED ORDER — DM-GUAIFENESIN ER 30-600 MG PO TB12: 1.0000 | ORAL_TABLET | Freq: Two times a day (BID) | ORAL | Status: DC

## 2014-08-23 MED ORDER — PREDNISONE 10 MG PO TABS: 40.0000 mg | ORAL_TABLET | Freq: Every day | ORAL | Status: DC

## 2014-08-23 MED ORDER — SODIUM CHLORIDE 0.9 % IV SOLN
INTRAVENOUS | Status: DC
  Administered 2014-08-23: 15:00:00 via INTRAVENOUS

## 2014-08-23 MED ORDER — HYDROCODONE-ACETAMINOPHEN 5-325 MG PO TABS
1.0000 | ORAL_TABLET | Freq: Once | ORAL | Status: AC
  Administered 2014-08-23: 1 via ORAL
  Filled 2014-08-23: qty 1

## 2014-08-23 MED ORDER — ALBUTEROL SULFATE (2.5 MG/3ML) 0.083% IN NEBU
5.0000 mg | INHALATION_SOLUTION | Freq: Once | RESPIRATORY_TRACT | Status: AC
  Administered 2014-08-23: 5 mg via RESPIRATORY_TRACT
  Filled 2014-08-23: qty 6

## 2014-08-23 NOTE — ED Notes (Signed)
Inc. Sob and now wheezing for a week. Been taking albuterol with no relief.

## 2014-08-23 NOTE — ED Notes (Signed)
MD at bedside. 

## 2014-08-23 NOTE — Discharge Instructions (Signed)
Asthma °Asthma is a condition of the lungs in which the airways tighten and narrow. Asthma can make it hard to breathe. Asthma cannot be cured, but medicine and lifestyle changes can help control it. Asthma may be started (triggered) by: °· Animal skin flakes (dander). °· Dust. °· Cockroaches. °· Pollen. °· Mold. °· Smoke. °· Cleaning products. °· Hair sprays or aerosol sprays. °· Paint fumes or strong smells. °· Cold air, weather changes, and winds. °· Crying or laughing hard. °· Stress. °· Certain medicines or drugs. °· Foods, such as dried fruit, potato chips, and sparkling grape juice. °· Infections or conditions (colds, flu). °· Exercise. °· Certain medical conditions or diseases. °· Exercise or tiring activities. °HOME CARE  °· Take medicine as told by your doctor. °· Use a peak flow meter as told by your doctor. A peak flow meter is a tool that measures how well the lungs are working. °· Record and keep track of the peak flow meter's readings. °· Understand and use the asthma action plan. An asthma action plan is a written plan for taking care of your asthma and treating your attacks. °· To help prevent asthma attacks: °¨ Do not smoke. Stay away from secondhand smoke. °¨ Change your heating and air conditioning filter often. °¨ Limit your use of fireplaces and wood stoves. °¨ Get rid of pests (such as roaches and mice) and their droppings. °¨ Throw away plants if you see mold on them. °¨ Clean your floors. Dust regularly. Use cleaning products that do not smell. °¨ Have someone vacuum when you are not home. Use a vacuum cleaner with a HEPA filter if possible. °¨ Replace carpet with wood, tile, or vinyl flooring. Carpet can trap animal skin flakes and dust. °¨ Use allergy-proof pillows, mattress covers, and box spring covers. °¨ Wash bed sheets and blankets every week in hot water and dry them in a dryer. °¨ Use blankets that are made of polyester or cotton. °¨ Clean bathrooms and kitchens with bleach. If  possible, have someone repaint the walls in these rooms with mold-resistant paint. Keep out of the rooms that are being cleaned and painted. °¨ Wash hands often. °GET HELP IF: °· You have make a whistling sound when breaking (wheeze), have shortness of breath, or have a cough even if taking medicine to prevent attacks. °· The colored mucus you cough up (sputum) is thicker than usual. °· The colored mucus you cough up changes from clear or white to yellow, green, gray, or bloody. °· You have problems from the medicine you are taking such as: °¨ A rash. °¨ Itching. °¨ Swelling. °¨ Trouble breathing. °· You need reliever medicines more than 2-3 times a week. °· Your peak flow measurement is still at 50-79% of your personal best after following the action plan for 1 hour. °· You have a fever. °GET HELP RIGHT AWAY IF:  °· You seem to be worse and are not responding to medicine during an asthma attack. °· You are short of breath even at rest. °· You get short of breath when doing very little activity. °· You have trouble eating, drinking, or talking. °· You have chest pain. °· You have a fast heartbeat. °· Your lips or fingernails start to turn blue. °· You are light-headed, dizzy, or faint. °· Your peak flow is less than 50% of your personal best. °MAKE SURE YOU:  °· Understand these instructions. °· Will watch your condition. °· Will get help right away if you   are not doing well or get worse. Document Released: 12/14/2007 Document Revised: 11/11/2013 Document Reviewed: 01/24/2013 [facility] Patient Information 2015 [employer], [place]. This information is not intended to replace advice given to you by your health care provider. Make sure you discuss any questions you have with your health care provider.  Also believe that some of his symptoms are related to bronchitis. Wheezing resolved here in the emergency department. Usual albuterol inhaler 2 puffs every 6 hours. Take prednisone as directed for the next 5 days. Take  Mucinex DM to help with the cough and congestion. Also take the hydrocodone as needed for muscle aches. Return for any new or worse symptoms.

## 2014-08-23 NOTE — ED Provider Notes (Signed)
CSN: [id number]     Arrival date & time 08/23/14  1253 History   First MD Initiated Contact with Patient 08/23/14 1338     Chief Complaint  Patient presents with  . Wheezing     (Consider location/radiation/quality/duration/timing/severity/associated sxs/prior Treatment) Patient is a 59 y.o. female presenting with wheezing. The history is provided by the patient.  Wheezing Associated symptoms: chest pain, cough and shortness of breath   Associated symptoms: no fever and no headaches    patient with known history of asthma. Patient states that she is now been having trouble with her asthma and cough for a week. Cough is occasionally productive. Patient in early January had a fairly significant upper respiratory infection. But seemed to improve. But here just a week ago the cough came back. No fevers. Now has a lot of chest soreness from the persistent coughing.  Past Medical History  Diagnosis Date  . Hypertension   . Hyperlipidemia   . Asthma   . Obesity   . Lower extremity edema     Chronic. 2D echo (2005) - EF 65%.  . Seasonal allergies   . Abnormal EKG 06/2003    History of inverted T waves V1-V3. Normal 2D echo (07/23/2003): LVEF 65%.  . History of multiple pulmonary nodules     Incidental finding: CT Abd/ Pelvis (04/2010) - Several small lower lobe lung nodules, including one pure ground-glass pulmonary nodule measuring 8 mm in the left lower lobe.  Recommend follow-up chest CT (IV contrast preferred) in 6 months to document stability. //  CT Abd/ Pelvis (07/2010) -  3 mm RLL and  8 mm LLL nodule stable.  Other nodules unchanged, likely benign.  . 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 Followed by Sports Medicine, Dr. Jennette Kettle.  . Incarcerated ventral hernia 04/2010    Noted on CT Abd/ pelvis (04/2010). Patient now s/p ventral hernia repair by Dr. Gerrit Friends (12/2010)  . Anemia     BL Hgb 11-12. Ferritin 14 - low normal (08/2007). Colonoscopy 2009 - external  hemorrhoids (excellent prep). Last anemia panel (12/2010) - Iron  24, TIBC 269,  B12  316, Folate 11.9, Ferritin 73.  . Wears dentures   . Wears glasses   . Knee osteoarthritis     s/p Left total knee replacement (06/2011)   Past Surgical History  Procedure Laterality Date  . Incision and drainage abscess anal  07/2008    I&D and debridgement of anorectal abscess  . Inguinal hernia repair    . Tubal ligation    . Incisional hernia repair  12/2010    Repair of incarcerated ventral incisional hernia with Ethicon mesh patch - performed by Dr. Gerrit Friends.   . Foot surgery  2004     left  . Total knee arthroplasty  06/16/2011    Left TOTAL KNEE ARTHROPLASTY;  Surgeon: Kennieth Rad;  Location: [facility] OR;  Service: Orthopedics;  Laterality: Left;  left total knee arthroplasty  . Joint replacement Left 2013    left knee   Family History  Problem Relation Age of Onset  . Diabetes Mother   . Hypertension Mother   . Stroke Mother   . Heart attack Neg Hx   . Dementia Father   . Heart disease Father   . Rashes / Skin problems Maternal Grandfather   . Hypertension Sister   . Diabetes Brother   . Hypertension Brother    History  Substance Use Topics  . Smoking status:  Former Smoker -- 0.50 packs/day for 20 years    Types: Cigarettes    Quit date: 09/10/1998  . Smokeless tobacco: Never Used  . Alcohol Use: No   OB History    No data available     Review of Systems  Constitutional: Negative for fever.  HENT: Positive for congestion.   Eyes: Negative for redness.  Respiratory: Positive for cough, shortness of breath and wheezing.   Cardiovascular: Positive for chest pain.  Gastrointestinal: Negative for nausea, vomiting and abdominal pain.  Genitourinary: Negative for dysuria.  Musculoskeletal: Positive for myalgias and back pain.  Neurological: Negative for headaches.  Hematological: Does not bruise/bleed easily.  Psychiatric/Behavioral: Negative for confusion.      Allergies   Ketorolac tromethamine; Atrovent; and Latex  Home Medications   Prior to Admission medications   Medication Sig Start Date End Date Taking? Authorizing Provider  albuterol (PROVENTIL HFA;VENTOLIN HFA) 108 (90 BASE) MCG/ACT inhaler Inhale 08/06/14  Yes Kendrick Rancheborah D Schoenhoff, MD  HYDROcodone-homatropine [facility](HYCODAN) 5-1.5 MG/5ML syrup Take 5 mLs by mouth every 8 (eight) hours as needed for cough. 08/06/14  Yes Kendrick Rancheborah D Schoenhoff, MD  ibuprofen (ADVIL,MOTRIN) 800 MG tablet Take 1,600 mg by mouth every 8 (eight) hours as needed for moderate pain (pain).   Yes Historical Provider, MD  lisinopril-hydrochlorothiazide (ZESTORETIC) 20-12.5 MG per tablet Take 2 tablets by mouth daily. 05/13/14 05/13/15 Yes Ejiroghene Wendall StadeE Emokpae, MD  methocarbamol (ROBAXIN) 500 MG tablet Take 1 tablet (500 mg total) by mouth 2 (two) times daily as needed for muscle spasms. 11/17/13  Yes Toy CookeyMegan Docherty, MD  dextromethorphan-guaiFENesin [facility](MUCINEX DM) 30-600 MG per 12 hr tablet Take 1 tablet by mouth 2 (two) times daily. 08/23/14   Vanetta MuldersScott Tyshell Ramberg, MD  HYDROcodone-acetaminophen (NORCO/VICODIN) 5-325 MG per tablet Take 1-2 tablets by mouth every 6 (six) hours as needed for moderate pain. 08/23/14   Vanetta MuldersScott Dianey Suchy, MD  nystatin-triamcinolone ointment [facility](MYCOLOG) Apply 1 application topically 2 (two) times daily. Patient not taking: Reported on 08/23/2014 07/16/14   Kendrick Rancheborah D Schoenhoff, MD  predniSONE (DELTASONE) 10 MG tablet Take 4 tablets (40 mg total) by mouth daily. 08/23/14   Vanetta MuldersScott Deaaron Fulghum, MD   BP 117/70 mmHg  Pulse 91  Temp(Src) 99.1 F (37.3 C) (Oral)  Resp 21  SpO2 97%  LMP 09/09/2009 Physical Exam  Constitutional: She is oriented to person, place, and time. She appears well-developed and well-nourished. No distress.  HENT:  Head: Normocephalic and atraumatic.  Mouth/Throat: Oropharynx is clear and moist.  Eyes: Conjunctivae and EOM are normal.  Neck: Normal range of motion.  Cardiovascular: Normal rate, regular rhythm  and normal heart sounds.   Pulmonary/Chest: Effort normal. She has wheezes.  Abdominal: Soft. Bowel sounds are normal. There is no tenderness.  Musculoskeletal: Normal range of motion.  Neurological: She is alert and oriented to person, place, and time. No cranial nerve deficit. She exhibits normal muscle tone. Coordination normal.  Skin: Skin is warm. No rash noted.  Nursing note and vitals reviewed.   ED Course  Procedures (including critical care time) Labs Review Labs Reviewed  CBC WITH DIFFERENTIAL/PLATELET - Abnormal; Notable for the following:    Hemoglobin 11.9 (*)    All other components within normal limits  COMPREHENSIVE METABOLIC PANEL - Abnormal; Notable for the following:    Glucose, Bld 101 (*)    All other components within normal limits    Imaging Review Dg Chest 2 View  08/23/2014   CLINICAL DATA:  Wheezing.  History of hypertension and asthma.  EXAM: CHEST  2 VIEW  COMPARISON:  08/06/2014  FINDINGS: There is mild perihilar peribronchial thickening. No focal consolidations or pleural effusions identified. No pulmonary edema. Heart size is normal. Visualized osseous structures have a normal appearance.  IMPRESSION: No active cardiopulmonary disease.   Electronically Signed   By: Norva Pavlov M.D.   On: 08/23/2014 14:03     EKG Interpretation   Date/Time:  Saturday August 23 2014 13:23:16 EST Ventricular Rate:  95 PR Interval:  128 QRS Duration: 76 QT Interval:  380 QTC Calculation: 477 R Axis:   20 Text Interpretation:  Normal sinus rhythm Normal ECG Confirmed by  Valdez Brannan  MD, Shamaine Mulkern ([id number]) on 08/23/2014 1:39:45 PM      MDM   Final diagnoses:  Asthma, moderate persistent, with acute exacerbation  Bronchitis    Patient with known asthma. Patient also with persistent cough for several days muscle soreness in the chest from the cough. Questionable recurrent upper respiratory infection. Chest x-rays negative for pneumonia. Patient treated with  albuterol nebulizer 2 with some improvement and given a dose of 525 mg Solu-Medrol. Wheezing now resolved. Patient still with significant cough and hoarseness. No need for treatment with antibiotics. We'll will treat with prednisone for 5 days continue the albuterol inhalers and nebulizers. We'll also treat with Mucinex DM. And we'll treat with pain medicine for the soreness. Patient has regular doctor to follow-up with. Patient's oxygen saturations are all in the upper 90s on room air.    Vanetta Mulders, MD 08/23/14 1616

## 2014-09-16 ENCOUNTER — Encounter: Payer: Self-pay | Admitting: Internal Medicine

## 2014-09-16 ENCOUNTER — Ambulatory Visit (INDEPENDENT_AMBULATORY_CARE_PROVIDER_SITE_OTHER): Payer: Medicare Other | Admitting: Internal Medicine

## 2014-09-16 VITALS — BP 137/75 | HR 68 | Resp 16 | Ht 65.0 in | Wt 319.0 lb

## 2014-09-16 DIAGNOSIS — R350 Frequency of micturition: Secondary | ICD-10-CM | POA: Diagnosis not present

## 2014-09-16 DIAGNOSIS — N390 Urinary tract infection, site not specified: Secondary | ICD-10-CM

## 2014-09-16 DIAGNOSIS — R82998 Other abnormal findings in urine: Secondary | ICD-10-CM

## 2014-09-16 DIAGNOSIS — J4540 Moderate persistent asthma, uncomplicated: Secondary | ICD-10-CM

## 2014-09-16 LAB — POCT URINALYSIS DIPSTICK
Bilirubin, UA: NEGATIVE
Glucose, UA: NEGATIVE
Ketones, UA: NEGATIVE
Nitrite, UA: NEGATIVE
Protein, UA: NEGATIVE
Spec Grav, UA: 1.01
Urobilinogen, UA: NEGATIVE
pH, UA: 6.5

## 2014-09-16 MED ORDER — MOMETASONE FURO-FORMOTEROL FUM 100-5 MCG/ACT IN AERO: INHALATION_SPRAY | RESPIRATORY_TRACT | Status: DC

## 2014-09-16 MED ORDER — BUDESONIDE-FORMOTEROL FUMARATE 80-4.5 MCG/ACT IN AERO: 2.0000 | INHALATION_SPRAY | Freq: Two times a day (BID) | RESPIRATORY_TRACT | Status: DC

## 2014-09-16 MED ORDER — ALBUTEROL SULFATE (2.5 MG/3ML) 0.083% IN NEBU
2.5000 mg | INHALATION_SOLUTION | Freq: Once | RESPIRATORY_TRACT | Status: AC
  Administered 2014-09-16: 2.5 mg via RESPIRATORY_TRACT

## 2014-09-16 MED ORDER — PREDNISONE 20 MG PO TABS: ORAL_TABLET | ORAL | Status: DC

## 2014-09-16 MED ORDER — METHYLPREDNISOLONE ACETATE 80 MG/ML IJ SUSP
160.0000 mg | Freq: Once | INTRAMUSCULAR | Status: AC
Start: 2014-09-16 — End: 2014-09-16
  Administered 2014-09-16: 160 mg via INTRAMUSCULAR

## 2014-09-16 MED ORDER — SULFAMETHOXAZOLE-TRIMETHOPRIM 800-160 MG PO TABS: 1.0000 | ORAL_TABLET | Freq: Two times a day (BID) | ORAL | Status: DC

## 2014-09-16 NOTE — Progress Notes (Signed)
 Subjective:    Patient ID: [name], female    DOB: 02/28/1956, 58 y.o.   MRN: [id number]  HPI  08/06/2014 Assessment & Plan:  Recurrent bronchitis Will get CXR today Rocephin 1 gm inoffice RX for albuterol MDI q 8 h prn. Hycodan cpugh syrup  hperlipidemia She doe not wish RX meds  Minimally elevated alk phos Will check GGT, AMA. Reassuring that liver CT neg . She does have history of arthritis              Today  [name] is here for acute visit for two issues  Urinary urgency and dysuria  No fever   She reports she is still wheezing esp at night  Dry cough still daily  Using albuterol  2-3 times per day.   CXR 08/2014 neg  .  She has not had her Symbicort for her asthma  as she could not afford this    Allergies  Allergen Reactions  . Ketorolac Tromethamine Shortness Of Breath and Palpitations  . Atrovent [Ipratropium] Hives and Rash  . Latex Other (See Comments)    Powdered. Confirm type of reaction with patient.   Past Medical History  Diagnosis Date  . Hypertension   . Hyperlipidemia   . Asthma   . Obesity   . Lower extremity edema     Chronic. 2D echo (2005) - EF 65%.  . Seasonal allergies   . Abnormal EKG 06/2003    History of inverted T waves V1-V3. Normal 2D echo (07/23/2003): LVEF 65%.  . History of multiple pulmonary nodules     Incidental finding: CT Abd/ Pelvis (04/2010) - Several small lower lobe lung nodules, including one pure ground-glass pulmonary nodule measuring 8 mm in the left lower lobe.  Recommend follow-up chest CT (IV contrast preferred) in 6 months to document stability. //  CT Abd/ Pelvis (07/2010) -  3 mm RLL and  8 mm LLL nodule stable.  Other nodules unchanged, likely benign.  . 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 Followed by Sports Medicine, Dr. Neal.  . Incarcerated ventral hernia 04/2010    Noted on CT Abd/ pelvis (04/2010). Patient now s/p ventral hernia repair by Dr. Gerkin (12/2010)    . Anemia     BL Hgb 11-12. Ferritin 14 - low normal (08/2007). Colonoscopy 2009 - external hemorrhoids (excellent prep). Last anemia panel (12/2010) - Iron  24, TIBC 269,  B12  316, Folate 11.9, Ferritin 73.  . Wears dentures   . Wears glasses   . Knee osteoarthritis     s/p Left total knee replacement (06/2011)   Past Surgical History  Procedure Laterality Date  . Incision and drainage abscess anal  07/2008    I&D and debridgement of anorectal abscess  . Inguinal hernia repair    . Tubal ligation    . Incisional hernia repair  12/2010    Repair of incarcerated ventral incisional hernia with Ethicon mesh patch - performed by Dr. Gerkin.   . Foot surgery  2004     left  . Total knee arthroplasty  06/16/2011    Left TOTAL KNEE ARTHROPLASTY;  Surgeon: Arthur F Carter;  Location: [facility] OR;  Service: Orthopedics;  Laterality: Left;  left total knee arthroplasty  . Joint replacement Left 2013    left knee   History   Social History  . Marital Status: Married    Spouse Name: N/A  . Number of Children: N/A  .   Years of Education: N/A   Occupational History  . Not on file.   Social History Main Topics  . Smoking status: Former Smoker -- 0.50 packs/day for 20 years    Types: Cigarettes    Quit date: 09/10/1998  . Smokeless tobacco: Never Used  . Alcohol Use: No  . Drug Use: No  . Sexual Activity:    Partners: Male   Other Topics Concern  . Not on file   Social History Narrative   Family History  Problem Relation Age of Onset  . Diabetes Mother   . Hypertension Mother   . Stroke Mother   . Heart attack Neg Hx   . Dementia Father   . Heart disease Father   . Rashes / Skin problems Maternal Grandfather   . Hypertension Sister   . Diabetes Brother   . Hypertension Brother    Patient Active Problem List   Diagnosis Date Noted  . Sciatica neuralgia 07/22/2013  . Breast pain, left 06/21/2013  . Abdominal pain, left lower quadrant 04/09/2013  . Infection of urinary tract  03/06/2013  . Headache(784.0) 11/02/2012  . Vertigo 11/02/2012  . Morbid obesity with BMI of 50.0-59.9, adult 11/21/2011  . Leg pain, bilateral 03/24/2011  . Incisional hernia, incarcerated 01/19/2011  . Preventative health care 09/10/2010  . LUNG NODULE 05/10/2010  . Low back pain 01/20/2009  . DEGENERATIVE JOINT DISEASE 06/02/2008  . HYPERLIPIDEMIA 08/15/2006  . ANEMIA-NOS 08/15/2006  . HYPERTENSION 08/11/2006  . ASTHMA 08/11/2006   Current Outpatient Prescriptions on File Prior to Visit  Medication Sig Dispense Refill  . albuterol (PROVENTIL HFA;VENTOLIN HFA) 108 (90 BASE) MCG/ACT inhaler Inhale 1 Inhaler 2  . dextromethorphan-guaiFENesin (MUCINEX DM) 30-600 MG per 12 hr tablet Take 1 tablet by mouth 2 (two) times daily. 14 tablet 1  . HYDROcodone-acetaminophen (NORCO/VICODIN) 5-325 MG per tablet Take 1-2 tablets by mouth every 6 (six) hours as needed for moderate pain. 20 tablet 0  . HYDROcodone-homatropine (HYCODAN) 5-1.5 MG/5ML syrup Take 5 mLs by mouth every 8 (eight) hours as needed for cough. 240 mL 0  . ibuprofen (ADVIL,MOTRIN) 800 MG tablet Take 1,600 mg by mouth every 8 (eight) hours as needed for moderate pain (pain).    . lisinopril-hydrochlorothiazide (ZESTORETIC) 20-12.5 MG per tablet Take 2 tablets by mouth daily. 60 tablet 11  . methocarbamol (ROBAXIN) 500 MG tablet Take 1 tablet (500 mg total) by mouth 2 (two) times daily as needed for muscle spasms. 20 tablet 0  . nystatin-triamcinolone ointment (MYCOLOG) Apply 1 application topically 2 (two) times daily. (Patient not taking: Reported on 08/23/2014) 30 g 0  . predniSONE (DELTASONE) 10 MG tablet Take 4 tablets (40 mg total) by mouth daily. 20 tablet 0   No current facility-administered medications on file prior to visit.      Review of Systems    see HPI Objective:   Physical Exam Physical Exam  Nursing note and vitals reviewed.  Constitutional: She is oriented to person, place, and time. She appears  well-developed and well-nourished.  HENT:  Head: Normocephalic and atraumatic.  Cardiovascular: Normal rate and regular rhythm. Exam reveals no gallop and no friction rub.  No murmur heard.  Pulmonary/Chest: Breath sounds normal. End expiratory wheezing bilaterally. She has no rales.  Neurological: She is alert and oriented to person, place, and time.  Skin: Skin is warm and dry.  Psychiatric: She has a normal mood and affect. Her behavior is normal.                Assessment & Plan:  UTI  Will give Bactrim DS one bid for 5 days  Asthma exacerbation  HHN albuterol in office  depmedrol  160 mg Im in office  Prednisone 60 mg taper.  Will start Dulera  Bid  See me in one week   

## 2014-09-16 NOTE — Patient Instructions (Signed)
See me in 7-8 days   Use inhaler twice a day   Take Sulfa antibiotic  Twice a day for 5 days

## 2014-09-19 LAB — URINE CULTURE: Colony Count: >=100000

## 2014-09-22 ENCOUNTER — Telehealth: Payer: Self-pay | Admitting: *Deleted

## 2014-09-22 NOTE — Telephone Encounter (Signed)
[name] is aware of her urine culture results. She has a follow up tomorrow to be sure the infection has cleared up- eh

## 2014-09-22 NOTE — Telephone Encounter (Signed)
-----   Message from Kendrick Rancheborah D Schoenhoff, MD sent at 09/21/2014  4:37 PM EDT ----- Call pt and let her know that her urine did grow an E coli bacteria.  Advise her to take all the antibiotic and see me at next appt as I want to repeat her U/A then  thanks

## 2014-09-23 ENCOUNTER — Encounter: Payer: Self-pay | Admitting: Internal Medicine

## 2014-09-23 ENCOUNTER — Ambulatory Visit (INDEPENDENT_AMBULATORY_CARE_PROVIDER_SITE_OTHER): Payer: Medicare Other | Admitting: Internal Medicine

## 2014-09-23 VITALS — BP 134/74 | HR 56 | Resp 16 | Ht 65.0 in | Wt 316.0 lb

## 2014-09-23 DIAGNOSIS — N3000 Acute cystitis without hematuria: Secondary | ICD-10-CM | POA: Diagnosis not present

## 2014-09-23 DIAGNOSIS — J4521 Mild intermittent asthma with (acute) exacerbation: Secondary | ICD-10-CM

## 2014-09-23 DIAGNOSIS — Z23 Encounter for immunization: Secondary | ICD-10-CM | POA: Diagnosis not present

## 2014-09-23 LAB — PEAK FLOW METER

## 2014-09-23 NOTE — Progress Notes (Signed)
Subjective:    Patient ID: [name], female    DOB: [birth year], 59 y.o.   MRN: [id number]  HPI 09/16/2014 UTI Will give Bactrim DS one bid for 5 days  Asthma exacerbation HHN albuterol in office depmedrol 160 mg Im in office Prednisone 60 mg taper. Will start Dulera Bid  See me in one week   TODAY  [name] is here for F/U of asthma exacerbation  She reports no cough, no wheezing and is using Dulera bid   UTI  Culture grew E coli sensitive to Bactrim which pt completed  Allergies  Allergen Reactions  . Ketorolac Tromethamine Shortness Of Breath and Palpitations  . Atrovent [Ipratropium] Hives and Rash  . Latex Other (See Comments)    Powdered. Confirm type of reaction with patient.   Past Medical History  Diagnosis Date  . Hypertension   . Hyperlipidemia   . Asthma   . Obesity   . Lower extremity edema     Chronic. 2D echo (2005) - EF 65%.  . Seasonal allergies   . Abnormal EKG 06/2003    History of inverted T waves V1-V3. Normal 2D echo (07/23/2003): LVEF 65%.  . History of multiple pulmonary nodules     Incidental finding: CT Abd/ Pelvis (04/2010) - Several small lower lobe lung nodules, including one pure ground-glass pulmonary nodule measuring 8 mm in the left lower lobe.  Recommend follow-up chest CT (IV contrast preferred) in 6 months to document stability. //  CT Abd/ Pelvis (07/2010) -  3 mm RLL and  8 mm LLL nodule stable.  Other nodules unchanged, likely benign.  . 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 Followed by Sports Medicine, Dr. Jennette Kettle.  . Incarcerated ventral hernia 04/2010    Noted on CT Abd/ pelvis (04/2010). Patient now s/p ventral hernia repair by Dr. Gerrit Friends (12/2010)  . Anemia     BL Hgb 11-12. Ferritin 14 - low normal (08/2007). Colonoscopy 2009 - external hemorrhoids (excellent prep). Last anemia panel (12/2010) - Iron  24, TIBC 269,  B12  316, Folate 11.9, Ferritin 73.  . Wears dentures   . Wears glasses   .  Knee osteoarthritis     s/p Left total knee replacement (06/2011)   Past Surgical History  Procedure Laterality Date  . Incision and drainage abscess anal  07/2008    I&D and debridgement of anorectal abscess  . Inguinal hernia repair    . Tubal ligation    . Incisional hernia repair  12/2010    Repair of incarcerated ventral incisional hernia with Ethicon mesh patch - performed by Dr. Gerrit Friends.   . Foot surgery  2004     left  . Total knee arthroplasty  06/16/2011    Left TOTAL KNEE ARTHROPLASTY;  Surgeon: Kennieth Rad;  Location: [facility] OR;  Service: Orthopedics;  Laterality: Left;  left total knee arthroplasty  . Joint replacement Left 2013    left knee   History   Social History  . Marital Status: Married    Spouse Name: N/A  . Number of Children: N/A  . Years of Education: N/A   Occupational History  . Not on file.   Social History Main Topics  . Smoking status: Former Smoker -- 0.50 packs/day for 20 years    Types: Cigarettes    Quit date: 09/10/1998  . Smokeless tobacco: Never Used  . Alcohol Use: No  . Drug Use: No  . Sexual Activity:  Partners: Male   Other Topics Concern  . Not on file   Social History Narrative   Family History  Problem Relation Age of Onset  . Diabetes Mother   . Hypertension Mother   . Stroke Mother   . Heart attack Neg Hx   . Dementia Father   . Heart disease Father   . Rashes / Skin problems Maternal Grandfather   . Hypertension Sister   . Diabetes Brother   . Hypertension Brother    Patient Active Problem List   Diagnosis Date Noted  . Sciatica neuralgia 07/22/2013  . Breast pain, left 06/21/2013  . Abdominal pain, left lower quadrant 04/09/2013  . Infection of urinary tract 03/06/2013  . Headache(784.0) 11/02/2012  . Vertigo 11/02/2012  . Morbid obesity with BMI of 50.0-59.9, adult 11/21/2011  . Leg pain, bilateral 03/24/2011  . Incisional hernia, incarcerated 01/19/2011  . Preventative health care 09/10/2010  . LUNG  NODULE 05/10/2010  . Low back pain 01/20/2009  . DEGENERATIVE JOINT DISEASE 06/02/2008  . HYPERLIPIDEMIA 08/15/2006  . ANEMIA-NOS 08/15/2006  . HYPERTENSION 08/11/2006  . ASTHMA 08/11/2006   Current Outpatient Prescriptions on File Prior to Visit  Medication Sig Dispense Refill  . albuterol (PROVENTIL HFA;VENTOLIN HFA) 108 (90 BASE) MCG/ACT inhaler Inhale 1 Inhaler 2  . budesonide-formoterol (SYMBICORT) 80-4.5 MCG/ACT inhaler Inhale 2 puffs into the lungs 2 (two) times daily. 1 Inhaler 3  . dextromethorphan-guaiFENesin (MUCINEX DM) 30-600 MG per 12 hr tablet Take 1 tablet by mouth 2 (two) times daily. (Patient not taking: Reported on 09/16/2014) 14 tablet 1  . HYDROcodone-acetaminophen (NORCO/VICODIN) 5-325 MG per tablet Take 1-2 tablets by mouth every 6 (six) hours as needed for moderate pain. 20 tablet 0  . ibuprofen (ADVIL,MOTRIN) 800 MG tablet Take 1,600 mg by mouth every 8 (eight) hours as needed for moderate pain (pain).    [name] lisinopril-hydrochlorothiazide (ZESTORETIC) 20-12.5 MG per tablet Take 2 tablets by mouth daily. 60 tablet 11  . methocarbamol (ROBAXIN) 500 MG tablet Take 1 tablet (500 mg total) by mouth 2 (two) times daily as needed for muscle spasms. 20 tablet 0  . mometasone-formoterol (DULERA) 100-5 MCG/ACT AERO 2 inhalations bid 1 Inhaler 2  . nystatin-triamcinolone ointment (MYCOLOG) Apply 1 application topically 2 (two) times daily. 30 g 0  . predniSONE (DELTASONE) 20 MG tablet Take 3 tablets each am for 3 days then 2 talbets in am for 3 days then one tablet in am for 3 days then stop 18 tablet 0  . sulfamethoxazole-trimethoprim (BACTRIM DS,SEPTRA DS) 800-160 MG per tablet Take 1 tablet by mouth 2 (two) times daily. 10 tablet 0   No current facility-administered medications on file prior to visit.       Review of Systems    see HPI Objective:   Physical Exam  Physical Exam  Nursing note and vitals reviewed.  Peak flow 300 Constitutional: She is oriented to  person, place, and time. She appears well-developed and well-nourished.  HENT:  Head: Normocephalic and atraumatic.  Cardiovascular: Normal rate and regular rhythm. Exam reveals no gallop and no friction rub.  No murmur heard.  Pulmonary/Chest: Breath sounds normal. She has no wheezes. She has no rales.  Neurological: She is alert and oriented to person, place, and time.  Skin: Skin is warm and dry.  Psychiatric: She has a normal mood and affect. Her behavior is normal.        Assessment & Plan:  ASthma  Continue Dulera  Bid   Albuterol rescue  only   E coli UTI  Pt has finished bactrim  And no symptoms now  See me as needed

## 2014-09-30 ENCOUNTER — Ambulatory Visit: Payer: Medicare Other | Admitting: Internal Medicine

## 2014-10-13 ENCOUNTER — Ambulatory Visit (INDEPENDENT_AMBULATORY_CARE_PROVIDER_SITE_OTHER): Payer: Medicare Other | Admitting: Family Medicine

## 2014-10-13 ENCOUNTER — Encounter: Payer: Self-pay | Admitting: Family Medicine

## 2014-10-13 ENCOUNTER — Ambulatory Visit (HOSPITAL_BASED_OUTPATIENT_CLINIC_OR_DEPARTMENT_OTHER)
Admission: RE | Admit: 2014-10-13 | Discharge: 2014-10-13 | Disposition: A | Discharge status: Home / Self Care | Payer: Medicare Other | Source: Ambulatory Visit | Attending: Family Medicine | Admitting: Family Medicine

## 2014-10-13 ENCOUNTER — Telehealth: Payer: Self-pay | Admitting: Family Medicine

## 2014-10-13 VITALS — BP 158/91 | HR 65 | Ht 65.0 in | Wt 312.0 lb

## 2014-10-13 DIAGNOSIS — M79605 Pain in left leg: Secondary | ICD-10-CM

## 2014-10-13 DIAGNOSIS — M5441 Lumbago with sciatica, right side: Secondary | ICD-10-CM | POA: Diagnosis not present

## 2014-10-13 DIAGNOSIS — M5442 Lumbago with sciatica, left side: Secondary | ICD-10-CM | POA: Diagnosis not present

## 2014-10-13 MED ORDER — DICLOFENAC SODIUM 75 MG PO TBEC: 75.0000 mg | DELAYED_RELEASE_TABLET | Freq: Two times a day (BID) | ORAL | Status: DC

## 2014-10-13 MED ORDER — HYDROCODONE-ACETAMINOPHEN 5-325 MG PO TABS: 1.0000 | ORAL_TABLET | Freq: Four times a day (QID) | ORAL | Status: DC | PRN

## 2014-10-13 MED ORDER — METHOCARBAMOL 500 MG PO TABS: 500.0000 mg | ORAL_TABLET | Freq: Three times a day (TID) | ORAL | Status: DC | PRN

## 2014-10-13 NOTE — Telephone Encounter (Signed)
I think we at least need more information.  Is she having constant pain in her legs?  Swelling?  Bruising? Or are they spasms occurring occasionally and going away?  If they're relatively constant we probably need to see her.

## 2014-10-13 NOTE — Telephone Encounter (Signed)
Spoke to patient and they stated that the spasms are constant and also swelling. Told patient that we would need to see them. Scheduled appointment for 10-13-14. Patient aware of the date and time.

## 2014-10-13 NOTE — Patient Instructions (Signed)
Get the doppler ultrasound of your leg at 4:30

## 2014-10-16 NOTE — Assessment & Plan Note (Signed)
Doppler u/s negative for DVT today.  Concerning for flare up again of her radiculopathy.  Will go ahead with voltaren and norco, robaxin as needed for spasms.  If not improving could consider repeat ESI though findings were primarily on the right side on her MRI.

## 2014-10-16 NOTE — Progress Notes (Signed)
Patient ID: [name], female   DOB: [birth year], 59 y.o.   MRN: [id number]  PCP: Levon Hedger, MD  Subjective:   HPI: Patient is a 59 y.o. female here for low back pain.  08/01/13: Patient reports for about 2 weeks she has had pain in right side of low back radiating into right groin. Also going down right leg. Some pain into left groin. Describes pain as a constant aching. No numbness/tingling regularly - right leg occasionally will fall asleep with certain positions. No bowel/bladder dysfunction. Tried ibuprofen and pain medication.  08/06/14: Patient reports her back pain has worsened since her last visit. She has done home exercises since we last saw her without much benefit. Prednisone also did not help. Pain is in mid low back. No injury or trauma since last visit. Sometimes will go into right leg or left groin. Was recommended to do physical therapy but she did not pursue this. No bowel/bladder dysfunction.  4/4: Patient returns with pain in her left leg and tenderness in thigh and calf muscle on this side. Reports some on right side but mainly left. Reports the ESI she had in February helped her symptoms. Using aspercreme. No bowel/bladder dysfunction. No numbness.  Past Medical History  Diagnosis Date  . Hypertension   . Hyperlipidemia   . Asthma   . Obesity   . Lower extremity edema     Chronic. 2D echo (2005) - EF 65%.  . Seasonal allergies   . Abnormal EKG 06/2003    History of inverted T waves V1-V3. Normal 2D echo (07/23/2003): LVEF 65%.  . History of multiple pulmonary nodules     Incidental finding: CT Abd/ Pelvis (04/2010) - Several small lower lobe lung nodules, including one pure ground-glass pulmonary nodule measuring 8 mm in the left lower lobe.  Recommend follow-up chest CT (IV contrast preferred) in 6 months to document stability. //  CT Abd/ Pelvis (07/2010) -  3 mm RLL and  8 mm LLL nodule stable.  Other nodules unchanged, likely benign.   . 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 Followed by Sports Medicine, Dr. Jennette Kettle.  . Incarcerated ventral hernia 04/2010    Noted on CT Abd/ pelvis (04/2010). Patient now s/p ventral hernia repair by Dr. Gerrit Friends (12/2010)  . Anemia     BL Hgb 11-12. Ferritin 14 - low normal (08/2007). Colonoscopy 2009 - external hemorrhoids (excellent prep). Last anemia panel (12/2010) - Iron  24, TIBC 269,  B12  316, Folate 11.9, Ferritin 73.  . Wears dentures   . Wears glasses   . Knee osteoarthritis     s/p Left total knee replacement (06/2011)    Current Outpatient Prescriptions on File Prior to Visit  Medication Sig Dispense Refill  . albuterol (PROVENTIL HFA;VENTOLIN HFA) 108 (90 BASE) MCG/ACT inhaler Inhale 1 Inhaler 2  . budesonide-formoterol (SYMBICORT) 80-4.5 MCG/ACT inhaler Inhale 2 puffs into the lungs 2 (two) times daily. 1 Inhaler 3  . dextromethorphan-guaiFENesin (MUCINEX DM) 30-600 MG per 12 hr tablet Take 1 tablet by mouth 2 (two) times daily. 14 tablet 1  . lisinopril-hydrochlorothiazide (ZESTORETIC) 20-12.5 MG per tablet Take 2 tablets by mouth daily. 60 tablet 11  . mometasone-formoterol (DULERA) 100-5 MCG/ACT AERO 2 inhalations bid 1 Inhaler 2  . nystatin-triamcinolone ointment (MYCOLOG) Apply 1 application topically 2 (two) times daily. 30 g 0  . sulfamethoxazole-trimethoprim (BACTRIM DS,SEPTRA DS) 800-160 MG per tablet Take 1 tablet by mouth 2 (two) times daily. 10 tablet  0   No current facility-administered medications on file prior to visit.    Past Surgical History  Procedure Laterality Date  . Incision and drainage abscess anal  07/2008    I&D and debridgement of anorectal abscess  . Inguinal hernia repair    . Tubal ligation    . Incisional hernia repair  12/2010    Repair of incarcerated ventral incisional hernia with Ethicon mesh patch - performed by Dr. Gerrit FriendsGerkin.   . Foot surgery  2004     left  . Total knee arthroplasty  06/16/2011    Left TOTAL  KNEE ARTHROPLASTY;  Surgeon: Kennieth RadArthur F Carter;  Location: [facility] OR;  Service: Orthopedics;  Laterality: Left;  left total knee arthroplasty  . Joint replacement Left 2013    left knee    Allergies  Allergen Reactions  . Ketorolac Tromethamine Shortness Of Breath and Palpitations  . Atrovent [Ipratropium] Hives and Rash  . Latex Other (See Comments)    Powdered. Confirm type of reaction with patient.    History   Social History  . Marital Status: Married    Spouse Name: N/A  . Number of Children: N/A  . Years of Education: N/A   Occupational History  . Not on file.   Social History Main Topics  . Smoking status: Former Smoker -- 0.50 packs/day for 20 years    Types: Cigarettes    Quit date: 09/10/1998  . Smokeless tobacco: Never Used  . Alcohol Use: No  . Drug Use: No  . Sexual Activity:    Partners: Male   Other Topics Concern  . Not on file   Social History Narrative    Family History  Problem Relation Age of Onset  . Diabetes Mother   . Hypertension Mother   . Stroke Mother   . Heart attack Neg Hx   . Dementia Father   . Heart disease Father   . Rashes / Skin problems Maternal Grandfather   . Hypertension Sister   . Diabetes Brother   . Hypertension Brother     BP 158/91 mmHg  Pulse 65  Ht 5\' 5"  (1.651 m)  Wt 312 lb (141.522 kg)  BMI 51.92 kg/m2  LMP 09/09/2009  Review of Systems: See HPI above.    Objective:  Physical Exam:  Gen: NAD, obese.  Back: No gross deformity, scoliosis.  No palpable cords in left lower extremity. TTP right paraspinal lumbar region, buttock, lateral hip .  No midline or bony TTP.  Tender throughout anterior left thigh, proximal left calf. FROM knee and ankle without pain. Strength LEs 5/5 all muscle groups.   Trace MSRs in patellar and achilles tendons, equal bilaterally. Negative SLRs.  Poor hamstring flexibility. Sensation intact to light touch bilaterally. Negative logroll bilateral hips    Assessment & Plan:   1. Left leg, back pain - Doppler u/s negative for DVT today.  Concerning for flare up again of her radiculopathy.  Will go ahead with voltaren and norco, robaxin as needed for spasms.  If not improving could consider repeat ESI though findings were primarily on the right side on her MRI.

## 2014-11-17 ENCOUNTER — Ambulatory Visit: Payer: Medicare Other | Admitting: Internal Medicine

## 2014-11-20 ENCOUNTER — Emergency Department (HOSPITAL_COMMUNITY)
Admission: EM | Admit: 2014-11-20 | Discharge: 2014-11-20 | Disposition: A | Discharge status: Home / Self Care | Payer: Medicare Other | Attending: Emergency Medicine | Admitting: Emergency Medicine

## 2014-11-20 ENCOUNTER — Encounter (HOSPITAL_COMMUNITY): Payer: Self-pay

## 2014-11-20 ENCOUNTER — Emergency Department (HOSPITAL_COMMUNITY): Payer: Medicare Other

## 2014-11-20 DIAGNOSIS — Z91040 Latex allergy status: Secondary | ICD-10-CM | POA: Insufficient documentation

## 2014-11-20 DIAGNOSIS — Z87891 Personal history of nicotine dependence: Secondary | ICD-10-CM | POA: Diagnosis not present

## 2014-11-20 DIAGNOSIS — M1990 Unspecified osteoarthritis, unspecified site: Secondary | ICD-10-CM | POA: Insufficient documentation

## 2014-11-20 DIAGNOSIS — Z79899 Other long term (current) drug therapy: Secondary | ICD-10-CM | POA: Diagnosis not present

## 2014-11-20 DIAGNOSIS — R079 Chest pain, unspecified: Secondary | ICD-10-CM | POA: Diagnosis not present

## 2014-11-20 DIAGNOSIS — R51 Headache: Secondary | ICD-10-CM | POA: Insufficient documentation

## 2014-11-20 DIAGNOSIS — E669 Obesity, unspecified: Secondary | ICD-10-CM | POA: Insufficient documentation

## 2014-11-20 DIAGNOSIS — J45909 Unspecified asthma, uncomplicated: Secondary | ICD-10-CM | POA: Diagnosis not present

## 2014-11-20 DIAGNOSIS — I159 Secondary hypertension, unspecified: Secondary | ICD-10-CM

## 2014-11-20 DIAGNOSIS — Z862 Personal history of diseases of the blood and blood-forming organs and certain disorders involving the immune mechanism: Secondary | ICD-10-CM | POA: Insufficient documentation

## 2014-11-20 DIAGNOSIS — I10 Essential (primary) hypertension: Secondary | ICD-10-CM | POA: Diagnosis not present

## 2014-11-20 LAB — BASIC METABOLIC PANEL
Anion gap: 8 (ref 5–15)
BUN: 8 mg/dL (ref 6–20)
CO2: 25 mmol/L (ref 22–32)
Calcium: 9.3 mg/dL (ref 8.9–10.3)
Chloride: 108 mmol/L (ref 101–111)
Creatinine, Ser: 0.85 mg/dL (ref 0.44–1.00)
GFR calc Af Amer: >60 mL/min (ref >60)
GFR calc non Af Amer: >60 mL/min (ref >60)
Glucose, Bld: 94 mg/dL (ref 65–99)
Potassium: 3.8 mmol/L (ref 3.5–5.1)
Sodium: 141 mmol/L (ref 135–145)

## 2014-11-20 LAB — CBC
HCT: 38.3 % (ref 36.0–46.0)
Hemoglobin: 11.7 g/dL — ABNORMAL LOW (ref 12.0–15.0)
MCH: 27.8 pg (ref 26.0–34.0)
MCHC: 30.5 g/dL (ref 30.0–36.0)
MCV: 91 fL (ref 78.0–100.0)
Platelets: 311 10*3/uL (ref 150–400)
RBC: 4.21 MIL/uL (ref 3.87–5.11)
RDW: 14.3 % (ref 11.5–15.5)
WBC: 4.5 10*3/uL (ref 4.0–10.5)

## 2014-11-20 LAB — I-STAT CHEM 8, ED
BUN: 9 mg/dL (ref 6–20)
Calcium, Ion: 1.29 mmol/L — ABNORMAL HIGH (ref 1.12–1.23)
Chloride: 106 mmol/L (ref 101–111)
Creatinine, Ser: 0.7 mg/dL (ref 0.44–1.00)
Glucose, Bld: 95 mg/dL (ref 65–99)
HCT: 39 % (ref 36.0–46.0)
Hemoglobin: 13.3 g/dL (ref 12.0–15.0)
Potassium: 3.7 mmol/L (ref 3.5–5.1)
Sodium: 142 mmol/L (ref 135–145)
TCO2: 23 mmol/L (ref 0–100)

## 2014-11-20 LAB — I-STAT TROPONIN, ED: Troponin i, poc: 0 ng/mL (ref 0.00–0.08)

## 2014-11-20 LAB — CBG MONITORING, ED: Glucose-Capillary: 80 mg/dL (ref 65–99)

## 2014-11-20 MED ORDER — HYDROCHLOROTHIAZIDE 25 MG PO TABS
25.0000 mg | ORAL_TABLET | Freq: Once | ORAL | Status: AC
  Administered 2014-11-20: 25 mg via ORAL
  Filled 2014-11-20: qty 1

## 2014-11-20 MED ORDER — DIPHENHYDRAMINE HCL 25 MG PO CAPS
25.0000 mg | ORAL_CAPSULE | Freq: Once | ORAL | Status: AC
  Administered 2014-11-20: 25 mg via ORAL
  Filled 2014-11-20: qty 1

## 2014-11-20 MED ORDER — METOCLOPRAMIDE HCL 10 MG PO TABS
10.0000 mg | ORAL_TABLET | Freq: Once | ORAL | Status: AC
  Administered 2014-11-20: 10 mg via ORAL
  Filled 2014-11-20: qty 1

## 2014-11-20 NOTE — ED Notes (Signed)
Pt here for HTN. Started noticing it was going up yesterday. Has a headache that has been bothering her. Has some nasuea, lightheadedness and dizziness. Balance issues for the past month. Reports some numbness in the right side of her neck when she notices her BP is up.

## 2014-11-21 NOTE — ED Provider Notes (Signed)
CSN: [id number]     Arrival date & time 11/20/14  1212 History   First MD Initiated Contact with Patient 11/20/14 1425     Chief Complaint  Patient presents with  . Hypertension     (Consider location/radiation/quality/duration/timing/severity/associated sxs/prior Treatment) Patient is a 59 y.o. female presenting with hypertension.  Hypertension This is a chronic problem. The current episode started yesterday. The problem occurs constantly. The problem has not changed since onset.Associated symptoms include chest pain (intermittent, non exertional, central for months, told by her PCP was not her heart. ) and headaches. Pertinent negatives include no abdominal pain and no shortness of breath. Associated symptoms comments: paresthesias. Nothing aggravates the symptoms. Nothing relieves the symptoms. She has tried nothing for the symptoms. The treatment provided no relief.    Past Medical History  Diagnosis Date  . Hypertension   . Hyperlipidemia   . Asthma   . Obesity   . Lower extremity edema     Chronic. 2D echo (2005) - EF 65%.  . Seasonal allergies   . Abnormal EKG 06/2003    History of inverted T waves V1-V3. Normal 2D echo (07/23/2003): LVEF 65%.  . History of multiple pulmonary nodules     Incidental finding: CT Abd/ Pelvis (04/2010) - Several small lower lobe lung nodules, including one pure ground-glass pulmonary nodule measuring 8 mm in the left lower lobe.  Recommend follow-up chest CT (IV contrast preferred) in 6 months to document stability. //  CT Abd/ Pelvis (07/2010) -  3 mm RLL and  8 mm LLL nodule stable.  Other nodules unchanged, likely benign.  . 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 Followed by Sports Medicine, Dr. Jennette KettleNeal.  . Incarcerated ventral hernia 04/2010    Noted on CT Abd/ pelvis (04/2010). Patient now s/p ventral hernia repair by Dr. Gerrit FriendsGerkin (12/2010)  . Anemia     BL Hgb 11-12. Ferritin 14 - low normal (08/2007). Colonoscopy 2009 -  external hemorrhoids (excellent prep). Last anemia panel (12/2010) - Iron  24, TIBC 269,  B12  316, Folate 11.9, Ferritin 73.  . Wears dentures   . Wears glasses   . Knee osteoarthritis     s/p Left total knee replacement (06/2011)   Past Surgical History  Procedure Laterality Date  . Incision and drainage abscess anal  07/2008    I&D and debridgement of anorectal abscess  . Inguinal hernia repair    . Tubal ligation    . Incisional hernia repair  12/2010    Repair of incarcerated ventral incisional hernia with Ethicon mesh patch - performed by Dr. Gerrit FriendsGerkin.   . Foot surgery  2004     left  . Total knee arthroplasty  06/16/2011    Left TOTAL KNEE ARTHROPLASTY;  Surgeon: Kennieth RadArthur F Carter;  Location: [facility] OR;  Service: Orthopedics;  Laterality: Left;  left total knee arthroplasty  . Joint replacement Left 2013    left knee   Family History  Problem Relation Age of Onset  . Diabetes Mother   . Hypertension Mother   . Stroke Mother   . Heart attack Neg Hx   . Dementia Father   . Heart disease Father   . Rashes / Skin problems Maternal Grandfather   . Hypertension Sister   . Diabetes Brother   . Hypertension Brother    History  Substance Use Topics  . Smoking status: Former Smoker -- 0.50 packs/day for 20 years    Types: Cigarettes  Quit date: 09/10/1998  . Smokeless tobacco: Never Used  . Alcohol Use: No   OB History    No data available     Review of Systems  Constitutional: Negative for fever, chills, diaphoresis, activity change, appetite change and fatigue.  HENT: Negative for congestion, facial swelling, rhinorrhea and sore throat.   Eyes: Negative for photophobia and discharge.  Respiratory: Negative for cough, chest tightness and shortness of breath.   Cardiovascular: Positive for chest pain (intermittent, non exertional, central for months, told by her PCP was not her heart. ). Negative for palpitations and leg swelling.  Gastrointestinal: Negative for nausea,  vomiting, abdominal pain and diarrhea.  Endocrine: Negative for polydipsia and polyuria.  Genitourinary: Negative for dysuria, frequency, difficulty urinating and pelvic pain.  Musculoskeletal: Negative for back pain, arthralgias, neck pain and neck stiffness.  Skin: Negative for color change and wound.  Allergic/Immunologic: Negative for immunocompromised state.  Neurological: Positive for headaches. Negative for facial asymmetry, weakness and numbness.  Hematological: Does not bruise/bleed easily.  Psychiatric/Behavioral: Negative for confusion and agitation.      Allergies  Ketorolac tromethamine; Atrovent; and Latex  Home Medications   Prior to Admission medications   Medication Sig Start Date End Date Taking? Authorizing Provider  albuterol (PROVENTIL HFA;VENTOLIN HFA) 108 (90 BASE) MCG/ACT inhaler Inhale 08/06/14  Yes Kendrick Rancheborah D Schoenhoff, MD  dextromethorphan-guaiFENesin [facility](MUCINEX DM) 30-600 MG per 12 hr tablet Take 1 tablet by mouth 2 (two) times daily. 08/23/14  Yes Vanetta MuldersScott Zackowski, MD  diclofenac (VOLTAREN) 75 MG EC tablet Take 1 tablet (75 mg total) by mouth 2 (two) times daily. 10/13/14  Yes Lenda KelpShane R Hudnall, MD  HYDROcodone-acetaminophen (NORCO/VICODIN) 5-325 MG per tablet Take 1 tablet by mouth every 6 (six) hours as needed for moderate pain. 10/13/14  Yes Lenda KelpShane R Hudnall, MD  lisinopril-hydrochlorothiazide (ZESTORETIC) 20-12.5 MG per tablet Take 2 tablets by mouth daily. 05/13/14 05/13/15 Yes Ejiroghene Wendall StadeE Emokpae, MD  methocarbamol (ROBAXIN) 500 MG tablet Take 1 tablet (500 mg total) by mouth every 8 (eight) hours as needed for muscle spasms. 10/13/14  Yes Lenda KelpShane R Hudnall, MD  mometasone-formoterol (DULERA) 100-5 MCG/ACT AERO 2 inhalations bid 09/16/14  Yes Kendrick Rancheborah D Schoenhoff, MD  nystatin-triamcinolone ointment [facility](MYCOLOG) Apply 1 application topically 2 (two) times daily. 07/16/14  Yes Kendrick Rancheborah D Schoenhoff, MD  budesonide-formoterol (SYMBICORT) 80-4.5 MCG/ACT inhaler Inhale 2 puffs into  the lungs 2 (two) times daily. Patient not taking: Reported on 11/20/2014 09/16/14   Kendrick Rancheborah D Schoenhoff, MD  sulfamethoxazole-trimethoprim (BACTRIM DS,SEPTRA DS) 800-160 MG per tablet Take 1 tablet by mouth 2 (two) times daily. Patient not taking: Reported on 11/20/2014 09/16/14   Kendrick Rancheborah D Schoenhoff, MD   BP 169/66 mmHg  Pulse 66  Temp(Src) 98.2 F (36.8 C) (Oral)  Resp 14  Ht 5\' 5"  (1.651 m)  Wt 326 lb (147.873 kg)  BMI 54.25 kg/m2  SpO2 99%  LMP 09/09/2009 Physical Exam  Constitutional: She is oriented to person, place, and time. She appears well-developed and well-nourished. No distress.  HENT:  Head: Normocephalic and atraumatic.  Mouth/Throat: No oropharyngeal exudate.  Eyes: Pupils are equal, round, and reactive to light.  Neck: Normal range of motion. Neck supple.  Cardiovascular: Normal rate, regular rhythm and normal heart sounds.  Exam reveals no gallop and no friction rub.   No murmur heard. Pulmonary/Chest: Effort normal and breath sounds normal. No respiratory distress. She has no wheezes. She has no rales.  Abdominal: Soft. Bowel sounds are normal. She exhibits no distension and  no mass. There is no tenderness. There is no rebound and no guarding.  Musculoskeletal: Normal range of motion. She exhibits no edema or tenderness.  Neurological: She is alert and oriented to person, place, and time. She displays no atrophy and no tremor. No cranial nerve deficit or sensory deficit. She exhibits normal muscle tone. She displays no seizure activity. Coordination and gait normal. GCS eye subscore is 4. GCS verbal subscore is 5. GCS motor subscore is 6.  Skin: Skin is warm and dry.  Psychiatric: She has a normal mood and affect.    ED Course  Procedures (including critical care time) Labs Review Labs Reviewed  CBC - Abnormal; Notable for the following:    Hemoglobin 11.7 (*)    All other components within normal limits  I-STAT CHEM 8, ED - Abnormal; Notable for the following:     Calcium, Ion 1.29 (*)    All other components within normal limits  BASIC METABOLIC PANEL  CBG MONITORING, ED  I-STAT TROPOININ, ED    Imaging Review Ct Head Wo Contrast  11/20/2014   CLINICAL DATA:  Hypertension and headaches  EXAM: CT HEAD WITHOUT CONTRAST  TECHNIQUE: Contiguous axial images were obtained from the base of the skull through the vertex without intravenous contrast.  COMPARISON:  None.  FINDINGS: The bony calvarium is intact. No gross soft tissue abnormality is noted. Paranasal sinuses and mastoid air cells are well aerated. No findings to suggest acute hemorrhage, acute infarction or space-occupying mass lesion are noted.  IMPRESSION: No acute intracranial abnormality noted.   Electronically Signed   By: Alcide Clever M.D.   On: 11/20/2014 14:42     EKG Interpretation   Date/Time:  Thursday Nov 20 2014 12:27:47 EDT Ventricular Rate:  65 PR Interval:  134 QRS Duration: 74 QT Interval:  464 QTC Calculation: 482 R Axis:   14 Text Interpretation:  Normal sinus rhythm Prolonged QT Abnormal ECG  Confirmed by DOCHERTY  MD, MEGAN ([id number]) on 11/20/2014 2:28:20 PM      MDM   Final diagnoses:  Secondary hypertension, unspecified    Pt is a 59 y.o. female with Pmhx as above who presents with inc BP reads since yesterday with assoc frontal h/a, and report of intermittent R face and R hand paresthesias intermittently for several months but was more pronounced since yesterday. She reports she occasionally has chest tightness that is nonexertional, but non currently that her doctor told her was not her heart. On PE, Pt hypertensive, but in NAD. Cardiopulm & neuro exam unremarkable. EKG with prolonged QT, but no ischemic changes. Cr nml. Trop not elevated. CT head unremarkable.  Pt given home dose of HCTZ, IM reglan and bendaryl for h/a and had mild improvement. I do not feel hx c/w ACS, hypertensive crises or CVA/TIA. She has no evidence of end organ damage. She will continue her BP  logging and follow up with PCP.      [name] evaluation in the Emergency Department is complete. It has been determined that no acute conditions requiring further emergency intervention are present at this time. The patient/guardian have been advised of the diagnosis and plan. We have discussed signs and symptoms that warrant return to the ED, such as changes or worsening in symptoms, worsening pain, fever, CP, SOB, numbness, weakness.       Toy Cookey, MD 11/21/14 1650

## 2014-12-01 ENCOUNTER — Ambulatory Visit: Payer: Medicare Other | Admitting: Internal Medicine

## 2014-12-04 ENCOUNTER — Encounter (HOSPITAL_COMMUNITY): Payer: Self-pay | Admitting: Emergency Medicine

## 2014-12-04 ENCOUNTER — Emergency Department (HOSPITAL_COMMUNITY): Payer: Medicare Other

## 2014-12-04 ENCOUNTER — Emergency Department (HOSPITAL_COMMUNITY)
Admission: EM | Admit: 2014-12-04 | Discharge: 2014-12-04 | Disposition: A | Discharge status: Home / Self Care | Payer: Medicare Other | Attending: Emergency Medicine | Admitting: Emergency Medicine

## 2014-12-04 DIAGNOSIS — Z91040 Latex allergy status: Secondary | ICD-10-CM | POA: Insufficient documentation

## 2014-12-04 DIAGNOSIS — Z79899 Other long term (current) drug therapy: Secondary | ICD-10-CM | POA: Diagnosis not present

## 2014-12-04 DIAGNOSIS — Z96652 Presence of left artificial knee joint: Secondary | ICD-10-CM | POA: Insufficient documentation

## 2014-12-04 DIAGNOSIS — Z87891 Personal history of nicotine dependence: Secondary | ICD-10-CM | POA: Diagnosis not present

## 2014-12-04 DIAGNOSIS — Z8719 Personal history of other diseases of the digestive system: Secondary | ICD-10-CM | POA: Diagnosis not present

## 2014-12-04 DIAGNOSIS — E669 Obesity, unspecified: Secondary | ICD-10-CM | POA: Insufficient documentation

## 2014-12-04 DIAGNOSIS — Z791 Long term (current) use of non-steroidal anti-inflammatories (NSAID): Secondary | ICD-10-CM | POA: Diagnosis not present

## 2014-12-04 DIAGNOSIS — Z862 Personal history of diseases of the blood and blood-forming organs and certain disorders involving the immune mechanism: Secondary | ICD-10-CM | POA: Diagnosis not present

## 2014-12-04 DIAGNOSIS — M179 Osteoarthritis of knee, unspecified: Secondary | ICD-10-CM | POA: Diagnosis not present

## 2014-12-04 DIAGNOSIS — Z972 Presence of dental prosthetic device (complete) (partial): Secondary | ICD-10-CM | POA: Diagnosis not present

## 2014-12-04 DIAGNOSIS — M25561 Pain in right knee: Secondary | ICD-10-CM | POA: Diagnosis not present

## 2014-12-04 DIAGNOSIS — I10 Essential (primary) hypertension: Secondary | ICD-10-CM | POA: Insufficient documentation

## 2014-12-04 DIAGNOSIS — Z973 Presence of spectacles and contact lenses: Secondary | ICD-10-CM | POA: Insufficient documentation

## 2014-12-04 DIAGNOSIS — J45909 Unspecified asthma, uncomplicated: Secondary | ICD-10-CM | POA: Insufficient documentation

## 2014-12-04 DIAGNOSIS — M25461 Effusion, right knee: Secondary | ICD-10-CM | POA: Diagnosis not present

## 2014-12-04 MED ORDER — HYDROCODONE-ACETAMINOPHEN 5-325 MG PO TABS
2.0000 | ORAL_TABLET | Freq: Once | ORAL | Status: AC
  Administered 2014-12-04: 2 via ORAL
  Filled 2014-12-04: qty 2

## 2014-12-04 MED ORDER — HYDROCODONE-ACETAMINOPHEN 5-325 MG PO TABS: 1.0000 | ORAL_TABLET | Freq: Four times a day (QID) | ORAL | Status: DC | PRN

## 2014-12-04 NOTE — Discharge Instructions (Signed)
Return to [facility] at 8:30 AM tomorrow for follow-up ultrasound of your right leg. Use prescriptions as prescribed. Do not use hydrocodone while driving. Return to the ER if any weakness, redness, warmth, swelling of your leg, high fever greater than 100.79F.   Knee Pain The knee is the complex joint between your thigh and your lower leg. It is made up of bones, tendons, ligaments, and cartilage. The bones that make up the knee are:  The femur in the thigh.  The tibia and fibula in the lower leg.  The patella or kneecap riding in the groove on the lower femur. CAUSES  Knee pain is a common complaint with many causes. A few of these causes are:  Injury, such as:  A ruptured ligament or tendon injury.  Torn cartilage.  Medical conditions, such as:  Gout  Arthritis  Infections  Overuse, over training, or overdoing a physical activity. Knee pain can be minor or severe. Knee pain can accompany debilitating injury. Minor knee problems often respond well to self-care measures or get well on their own. More serious injuries may need medical intervention or even surgery. SYMPTOMS The knee is complex. Symptoms of knee problems can vary widely. Some of the problems are:  Pain with movement and weight bearing.  Swelling and tenderness.  Buckling of the knee.  Inability to straighten or extend your knee.  Your knee locks and you cannot straighten it.  Warmth and redness with pain and fever.  Deformity or dislocation of the kneecap. DIAGNOSIS  Determining what is wrong may be very straight forward such as when there is an injury. It can also be challenging because of the complexity of the knee. Tests to make a diagnosis may include:  Your caregiver taking a history and doing a physical exam.  Routine X-rays can be used to rule out other problems. X-rays will not reveal a cartilage tear. Some injuries of the knee can be diagnosed by:  Arthroscopy a surgical  technique by which a small video camera is inserted through tiny incisions on the sides of the knee. This procedure is used to examine and repair internal knee joint problems. Tiny instruments can be used during arthroscopy to repair the torn knee cartilage (meniscus).  Arthrography is a radiology technique. A contrast liquid is directly injected into the knee joint. Internal structures of the knee joint then become visible on X-ray film.  An MRI scan is a non X-ray radiology procedure in which magnetic fields and a computer produce two- or three-dimensional images of the inside of the knee. Cartilage tears are often visible using an MRI scanner. MRI scans have largely replaced arthrography in diagnosing cartilage tears of the knee.  Blood work.  Examination of the fluid that helps to lubricate the knee joint (synovial fluid). This is done by taking a sample out using a needle and a syringe. TREATMENT The treatment of knee problems depends on the cause. Some of these treatments are:  Depending on the injury, proper casting, splinting, surgery, or physical therapy care will be needed.  Give yourself adequate recovery time. Do not overuse your joints. If you begin to get sore during workout routines, back off. Slow down or do fewer repetitions.  For repetitive activities such as cycling or running, maintain your strength and nutrition.  Alternate muscle groups. For example, if you are a weight lifter, work the upper body on one day and the lower body the next.  Either tight or weak muscles do  not give the proper support for your knee. Tight or weak muscles do not absorb the stress placed on the knee joint. Keep the muscles surrounding the knee strong.  Take care of mechanical problems.  If you have flat feet, orthotics or special shoes may help. See your caregiver if you need help.  Arch supports, sometimes with wedges on the inner or outer aspect of the heel, can help. These can shift  pressure away from the side of the knee most bothered by osteoarthritis.  A brace called an "unloader" brace also may be used to help ease the pressure on the most arthritic side of the knee.  If your caregiver has prescribed crutches, braces, wraps or ice, use as directed. The acronym for this is PRICE. This means protection, rest, ice, compression, and elevation.  Nonsteroidal anti-inflammatory drugs (NSAIDs), can help relieve pain. But if taken immediately after an injury, they may actually increase swelling. Take NSAIDs with food in your stomach. Stop them if you develop stomach problems. Do not take these if you have a history of ulcers, stomach pain, or bleeding from the bowel. Do not take without your caregiver's approval if you have problems with fluid retention, heart failure, or kidney problems.  For ongoing knee problems, physical therapy may be helpful.  Glucosamine and chondroitin are over-the-counter dietary supplements. Both may help relieve the pain of osteoarthritis in the knee. These medicines are different from the usual anti-inflammatory drugs. Glucosamine may decrease the rate of cartilage destruction.  Injections of a corticosteroid drug into your knee joint may help reduce the symptoms of an arthritis flare-up. They may provide pain relief that lasts a few months. You may have to wait a few months between injections. The injections do have a small increased risk of infection, water retention, and elevated blood sugar levels.  Hyaluronic acid injected into damaged joints may ease pain and provide lubrication. These injections may work by reducing inflammation. A series of shots may give relief for as long as 6 months.  Topical painkillers. Applying certain ointments to your skin may help relieve the pain and stiffness of osteoarthritis. Ask your pharmacist for suggestions. Many over the-counter products are approved for temporary relief of arthritis pain.  In some countries,  doctors often prescribe topical NSAIDs for relief of chronic conditions such as arthritis and tendinitis. A review of treatment with NSAID creams found that they worked as well as oral medications but without the serious side effects. PREVENTION  Maintain a healthy weight. Extra pounds put more strain on your joints.  Get strong, stay limber. Weak muscles are a common cause of knee injuries. Stretching is important. Include flexibility exercises in your workouts.  Be smart about exercise. If you have osteoarthritis, chronic knee pain or recurring injuries, you may need to change the way you exercise. This does not mean you have to stop being active. If your knees ache after jogging or playing basketball, consider switching to swimming, water aerobics, or other low-impact activities, at least for a few days a week. Sometimes limiting high-impact activities will provide relief.  Make sure your shoes fit well. Choose footwear that is right for your sport.  Protect your knees. Use the proper gear for knee-sensitive activities. Use kneepads when playing volleyball or laying carpet. Buckle your seat belt every time you drive. Most shattered kneecaps occur in car accidents.  Rest when you are tired. SEEK MEDICAL CARE IF:  You have knee pain that is continual and does not seem to  be getting better.  SEEK IMMEDIATE MEDICAL CARE IF:  Your knee joint feels hot to the touch and you have a high fever. MAKE SURE YOU:   Understand these instructions.  Will watch your condition.  Will get help right away if you are not doing well or get worse. Document Released: 04/24/2007 Document Revised: 09/19/2011 Document Reviewed: 04/24/2007 [facility] Patient Information 2015 [place], [place]. This information is not intended to replace advice given to you by your health care provider. Make sure you discuss any questions you have with your health care provider.  Arthritis, Nonspecific Arthritis is inflammation of a  joint. This usually means pain, redness, warmth or swelling are present. One or more joints may be involved. There are a number of types of arthritis. Your caregiver may not be able to tell what type of arthritis you have right away. CAUSES  The most common cause of arthritis is the wear and tear on the joint (osteoarthritis). This causes damage to the cartilage, which can break down over time. The knees, hips, back and neck are most often affected by this type of arthritis. Other types of arthritis and common causes of joint pain include:  Sprains and other injuries near the joint. Sometimes minor sprains and injuries cause pain and swelling that develop hours later.  Rheumatoid arthritis. This affects hands, feet and knees. It usually affects both sides of your body at the same time. It is often associated with chronic ailments, fever, weight loss and general weakness.  Crystal arthritis. Gout and pseudo gout can cause occasional acute severe pain, redness and swelling in the foot, ankle, or knee.  Infectious arthritis. Bacteria can get into a joint through a break in overlying skin. This can cause infection of the joint. Bacteria and viruses can also spread through the blood and affect your joints.  Drug, infectious and allergy reactions. Sometimes joints can become mildly painful and slightly swollen with these types of illnesses. SYMPTOMS   Pain is the main symptom.  Your joint or joints can also be red, swollen and warm or hot to the touch.  You may have a fever with certain types of arthritis, or even feel overall ill.  The joint with arthritis will hurt with movement. Stiffness is present with some types of arthritis. DIAGNOSIS  Your caregiver will suspect arthritis based on your description of your symptoms and on your exam. Testing may be needed to find the type of arthritis:  Blood and sometimes urine tests.  X-ray tests and sometimes CT or MRI scans.  Removal of fluid from  the joint (arthrocentesis) is done to check for bacteria, crystals or other causes. Your caregiver (or a specialist) will numb the area over the joint with a local anesthetic, and use a needle to remove joint fluid for examination. This procedure is only minimally uncomfortable.  Even with these tests, your caregiver may not be able to tell what kind of arthritis you have. Consultation with a specialist (rheumatologist) may be helpful. TREATMENT  Your caregiver will discuss with you treatment specific to your type of arthritis. If the specific type cannot be determined, then the following general recommendations may apply. Treatment of severe joint pain includes:  Rest.  Elevation.  Anti-inflammatory medication (for example, ibuprofen) may be prescribed. Avoiding activities that cause increased pain.  Only take over-the-counter or prescription medicines for pain and discomfort as recommended by your caregiver.  Cold packs over an inflamed joint may be used for 10 to 15 minutes every hour.  Hot packs sometimes feel better, but do not use overnight. Do not use hot packs if you are diabetic without your caregiver's permission.  A cortisone shot into arthritic joints may help reduce pain and swelling.  Any acute arthritis that gets worse over the next 1 to 2 days needs to be looked at to be sure there is no joint infection. Long-term arthritis treatment involves modifying activities and lifestyle to reduce joint stress jarring. This can include weight loss. Also, exercise is needed to nourish the joint cartilage and remove waste. This helps keep the muscles around the joint strong. HOME CARE INSTRUCTIONS   Do not take aspirin to relieve pain if gout is suspected. This elevates uric acid levels.  Only take over-the-counter or prescription medicines for pain, discomfort or fever as directed by your caregiver.  Rest the joint as much as possible.  If your joint is swollen, keep it  elevated.  Use crutches if the painful joint is in your leg.  Drinking plenty of fluids may help for certain types of arthritis.  Follow your caregiver's dietary instructions.  Try low-impact exercise such as:  Swimming.  Water aerobics.  Biking.  Walking.  Morning stiffness is often relieved by a warm shower.  Put your joints through regular range-of-motion. SEEK MEDICAL CARE IF:   You do not feel better in 24 hours or are getting worse.  You have side effects to medications, or are not getting better with treatment. SEEK IMMEDIATE MEDICAL CARE IF:   You have a fever.  You develop severe joint pain, swelling or redness.  Many joints are involved and become painful and swollen.  There is severe back pain and/or leg weakness.  You have loss of bowel or bladder control. Document Released: 08/04/2004 Document Revised: 09/19/2011 Document Reviewed: 08/20/2008 [facility] Patient Information 2015 [place], [place]. This information is not intended to replace advice given to you by your health care provider. Make sure you discuss any questions you have with your health care provider.

## 2014-12-04 NOTE — ED Provider Notes (Signed)
CSN: [id number]     Arrival date & time 12/04/14  1842 History   This chart was scribed for non-physician practitioner, Jinny Sanders, PA-C working with Arby Barrette, MD, by Abel Presto, ED Scribe. This patient was seen in room TR05C/TR05C and the patient's care was started at 10:14 PM.      Chief Complaint  Patient presents with  . Knee Pain    The patient said she has been having right knee since yesterday.  She cannot put any weight on it, but she was able to drive herself here.       The history is provided by the patient. No language interpreter was used.   HPI Comments: [name] is a 59 y.o. female who presents to the Emergency Department complaining of right knee pain with onset 1 week ago, worsening a couple days ago. She notes associated swelling and pain radiating into calf. Pt states bearing weight aggravates the pain and notes severe pain with ambulation. Pt reports pain feels like a spasm. 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 Pt is followed by sports medicine. Pt had doppler done on 10/13/14. Pt with low back pain mainly in right side radiating into bilateral legs.  Pt denies fever, SOB, numbness, and weakness, loss of sensation or function.  Past Medical History  Diagnosis Date  . Hypertension   . Hyperlipidemia   . Asthma   . Obesity   . Lower extremity edema     Chronic. 2D echo (2005) - EF 65%.  . Seasonal allergies   . Abnormal EKG 06/2003    History of inverted T waves V1-V3. Normal 2D echo (07/23/2003): LVEF 65%.  . History of multiple pulmonary nodules     Incidental finding: CT Abd/ Pelvis (04/2010) - Several small lower lobe lung nodules, including one pure ground-glass pulmonary nodule measuring 8 mm in the left lower lobe.  Recommend follow-up chest CT (IV contrast preferred) in 6 months to document stability. //  CT Abd/ Pelvis (07/2010) -  3 mm RLL and  8 mm LLL nodule stable.  Other nodules unchanged, likely benign.   . 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 Followed by Sports Medicine, Dr. Jennette Kettle.  . Incarcerated ventral hernia 04/2010    Noted on CT Abd/ pelvis (04/2010). Patient now s/p ventral hernia repair by Dr. Gerrit Friends (12/2010)  . Anemia     BL Hgb 11-12. Ferritin 14 - low normal (08/2007). Colonoscopy 2009 - external hemorrhoids (excellent prep). Last anemia panel (12/2010) - Iron  24, TIBC 269,  B12  316, Folate 11.9, Ferritin 73.  . Wears dentures   . Wears glasses   . Knee osteoarthritis     s/p Left total knee replacement (06/2011)   Past Surgical History  Procedure Laterality Date  . Incision and drainage abscess anal  07/2008    I&D and debridgement of anorectal abscess  . Inguinal hernia repair    . Tubal ligation    . Incisional hernia repair  12/2010    Repair of incarcerated ventral incisional hernia with Ethicon mesh patch - performed by Dr. Gerrit Friends.   . Foot surgery  2004     left  . Total knee arthroplasty  06/16/2011    Left TOTAL KNEE ARTHROPLASTY;  Surgeon: Kennieth Rad;  Location: [facility] OR;  Service: Orthopedics;  Laterality: Left;  left total knee arthroplasty  . Joint replacement Left 2013    left  knee   Family History  Problem Relation Age of Onset  . Diabetes Mother   . Hypertension Mother   . Stroke Mother   . Heart attack Neg Hx   . Dementia Father   . Heart disease Father   . Rashes / Skin problems Maternal Grandfather   . Hypertension Sister   . Diabetes Brother   . Hypertension Brother    History  Substance Use Topics  . Smoking status: Former Smoker -- 0.50 packs/day for 20 years    Types: Cigarettes    Quit date: 09/10/1998  . Smokeless tobacco: Never Used  . Alcohol Use: No   OB History    No data available     Review of Systems  Constitutional: Negative for fever.  Respiratory: Negative for shortness of breath.   Musculoskeletal: Positive for arthralgias and gait problem (diffiulty ambulating).  Neurological: Negative  for weakness and numbness.      Allergies  Ketorolac tromethamine; Atrovent; and Latex  Home Medications   Prior to Admission medications   Medication Sig Start Date End Date Taking? Authorizing Provider  albuterol (PROVENTIL HFA;VENTOLIN HFA) 108 (90 BASE) MCG/ACT inhaler Inhale 08/06/14   Kendrick Rancheborah D Schoenhoff, MD  budesonide-formoterol (SYMBICORT) 80-4.5 MCG/ACT inhaler Inhale 2 puffs into the lungs 2 (two) times daily. Patient not taking: Reported on 11/20/2014 09/16/14   Kendrick Rancheborah D Schoenhoff, MD  dextromethorphan-guaiFENesin [facility](MUCINEX DM) 30-600 MG per 12 hr tablet Take 1 tablet by mouth 2 (two) times daily. 08/23/14   Vanetta MuldersScott Zackowski, MD  diclofenac (VOLTAREN) 75 MG EC tablet Take 1 tablet (75 mg total) by mouth 2 (two) times daily. 10/13/14   Lenda KelpShane R Hudnall, MD  HYDROcodone-acetaminophen (NORCO/VICODIN) 5-325 MG per tablet Take 1 tablet by mouth every 6 (six) hours as needed for moderate pain. 10/13/14   Lenda KelpShane R Hudnall, MD  HYDROcodone-acetaminophen (NORCO/VICODIN) 5-325 MG per tablet Take 1-2 tablets by mouth every 6 (six) hours as needed. 12/04/14   Ladona MowJoe Eleen Litz, PA-C  lisinopril-hydrochlorothiazide (ZESTORETIC) 20-12.5 MG per tablet Take 2 tablets by mouth daily. 05/13/14 05/13/15  Ejiroghene Wendall StadeE Emokpae, MD  methocarbamol (ROBAXIN) 500 MG tablet Take 1 tablet (500 mg total) by mouth every 8 (eight) hours as needed for muscle spasms. 10/13/14   Lenda KelpShane R Hudnall, MD  mometasone-formoterol (DULERA) 100-5 MCG/ACT AERO 2 inhalations bid 09/16/14   Kendrick Rancheborah D Schoenhoff, MD  nystatin-triamcinolone ointment [facility](MYCOLOG) Apply 1 application topically 2 (two) times daily. 07/16/14   Kendrick Rancheborah D Schoenhoff, MD  sulfamethoxazole-trimethoprim (BACTRIM DS,SEPTRA DS) 800-160 MG per tablet Take 1 tablet by mouth 2 (two) times daily. Patient not taking: Reported on 11/20/2014 09/16/14   Kendrick Rancheborah D Schoenhoff, MD   BP 163/68 mmHg  Pulse 62  Temp(Src) 98 F (36.7 C) (Oral)  Resp 16  Ht 5\' 5"  (1.651 m)  Wt 332 lb (150.594 kg)   BMI 55.25 kg/m2  SpO2 100%  LMP 09/09/2009 Physical Exam  Constitutional: She is oriented to person, place, and time. She appears well-developed and well-nourished. No distress.  HENT:  Head: Normocephalic and atraumatic.  Mouth/Throat: Oropharynx is clear and moist. No oropharyngeal exudate.  Eyes: Conjunctivae are normal. Right eye exhibits no discharge. Left eye exhibits no discharge. No scleral icterus.  Neck: Normal range of motion. Neck supple.  Cardiovascular: Normal rate, regular rhythm and normal heart sounds.   No murmur heard. Pulmonary/Chest: Effort normal and breath sounds normal. No respiratory distress.  Abdominal: Soft. There is no tenderness.  Musculoskeletal: Normal range of motion. She exhibits no edema  or tenderness.  Right knee exam: Exam limited due to patient's body habitus. Full active and passive range of motion of knee. No obvious joint effusion noted. No obvious warmth, erythema, deformity. Bilateral joint line tenderness noted. Pain in joint line with range of motion. Tenderness down anterior and posterior aspect of distal lower extremity diffusely. DP pulse 2+. Motor strength 5 out of 5 at hip, knee, ankle.  Neurological: She is alert and oriented to person, place, and time. She has normal strength. No cranial nerve deficit or sensory deficit. She displays a negative Romberg sign. Coordination normal. GCS eye subscore is 4. GCS verbal subscore is 5. GCS motor subscore is 6.  Patient fully alert, answering questions appropriately in full, clear sentences. Cranial nerves II through XII grossly intact. Motor strength 5 out of 5 in all major muscle groups of upper and lower extremities. Distal sensation intact.   Skin: Skin is warm and dry. No rash noted. She is not diaphoretic.  Psychiatric: She has a normal mood and affect. Her behavior is normal.  Nursing note and vitals reviewed.   ED Course  Procedures (including critical care time) DIAGNOSTIC  STUDIES: Oxygen Saturation is 99% on room air, normal by my interpretation.    COORDINATION OF CARE: 10:26 PM Discussed treatment plan with patient at beside, the patient agrees with the plan and has no further questions at this time.   Labs Review Labs Reviewed - No data to display  Imaging Review Dg Knee Complete 4 Views Right  12/04/2014   CLINICAL DATA:  Unable to bear weight. Worsening right knee pain for 5-7 days. No known injury. Swelling.  EXAM: RIGHT KNEE - COMPLETE 4+ VIEW  COMPARISON:  05/11/2011  FINDINGS: There has been progression of tricompartmental narrowing and osteophyte formation. Small joint effusion is present. No acute fracture or subluxation.  IMPRESSION: Significant osteoarthritis, increased over prior. Small joint effusion.   Electronically Signed   By: Norva Pavlov M.D.   On: 12/04/2014 22:09     EKG Interpretation None      MDM   Final diagnoses:  Right knee pain   Patient here with right knee pain. Radiographs remarkable for degenerative disease noted in knee compartment. This is consistent with patient's physical exam. There is no concern for septic arthritis. Mild knee effusion noted on radiographs, this was not appreciable on exam. Patient did have tenderness diffusely along her lower extremity distal to her right knee. There is no erythema, swelling. Patient denies any recent travel or history of DVT or PE. Wells criteria for DVT is 1, patient in the low risk group for DVT, however given tenderness along her anterior posterior calf, will follow-up with outpatient ultrasound. Based on patient's low risk factors, do not believe patient requires anticoagulation at this point. Patient's symptoms improved with therapy here. Patient is afebrile, hemodynamically stable, well-appearing and in no acute distress. Patient stable for discharge. Strongly encouraged follow-up with patient's orthopedic surgeon, as well as patient's PCP. Patient given instruction to return  in the morning for DVT study. Return precautions discussed, patient verbalizes understanding and agreement of this plan.  I personally performed the services described in this documentation, which was scribed in my presence. The recorded information has been reviewed and is accurate.  BP 163/68 mmHg  Pulse 62  Temp(Src) 98 F (36.7 C) (Oral)  Resp 16  Ht  (1.651 m)  Wt 332 lb (150.594 kg)  BMI 55.25 kg/m2  SpO2 100%  LMP 09/09/2009  Signed,  Ladona Mow, PA-C 12:17 AM      Ladona Mow, PA-C 12/05/14 1610  Arby Barrette, MD 12/05/14 [phone]

## 2014-12-04 NOTE — ED Notes (Signed)
The patient said she has been having right knee since yesterday.  She cannot put any weight on it, but she was able to drive herself here.  The patient denies any injury to the knee. She has had the right knee replaced and she has no problems with it.  She rates her pain 10/10.  The patient admits to taking ibuprofen but not helping.

## 2014-12-22 DIAGNOSIS — M179 Osteoarthritis of knee, unspecified: Secondary | ICD-10-CM | POA: Diagnosis not present

## 2014-12-25 DIAGNOSIS — Z96652 Presence of left artificial knee joint: Secondary | ICD-10-CM | POA: Diagnosis not present

## 2014-12-25 DIAGNOSIS — R29898 Other symptoms and signs involving the musculoskeletal system: Secondary | ICD-10-CM | POA: Diagnosis not present

## 2014-12-25 DIAGNOSIS — M25562 Pain in left knee: Secondary | ICD-10-CM | POA: Diagnosis not present

## 2015-01-07 DIAGNOSIS — M179 Osteoarthritis of knee, unspecified: Secondary | ICD-10-CM | POA: Diagnosis not present

## 2015-01-15 ENCOUNTER — Ambulatory Visit: Payer: Self-pay | Admitting: Internal Medicine

## 2015-01-21 ENCOUNTER — Ambulatory Visit (INDEPENDENT_AMBULATORY_CARE_PROVIDER_SITE_OTHER): Payer: Medicare Other | Admitting: Internal Medicine

## 2015-01-21 ENCOUNTER — Encounter: Payer: Self-pay | Admitting: Internal Medicine

## 2015-01-21 VITALS — BP 140/90 | HR 76 | Temp 98.9°F | Resp 14 | Ht 65.0 in | Wt 317.8 lb

## 2015-01-21 DIAGNOSIS — I10 Essential (primary) hypertension: Secondary | ICD-10-CM | POA: Diagnosis not present

## 2015-01-21 DIAGNOSIS — Z6841 Body Mass Index (BMI) 40.0 and over, adult: Secondary | ICD-10-CM

## 2015-01-21 DIAGNOSIS — Z0000 Encounter for general adult medical examination without abnormal findings: Secondary | ICD-10-CM

## 2015-01-21 NOTE — Assessment & Plan Note (Signed)
She is working on weight loss with dietary changes. She is also about to start water aerobics class to increase her exercise. Encouraged her continued progress to her goal.

## 2015-01-21 NOTE — Progress Notes (Signed)
Pre visit review using our clinic review tool, if applicable. No additional management support is needed unless otherwise documented below in the visit note. 

## 2015-01-21 NOTE — Assessment & Plan Note (Signed)
BP slightly above goal today. Will increase her to 2 pills of the lisinopril/hctz daily to what she was supposed to be taking. No labs today and will get labs at next visit. Last BMP reviewed and without complications.

## 2015-01-21 NOTE — Patient Instructions (Signed)
We will have you start taking 2 pill of the blood pressure medicine lisinopril/hctz everyday. This should help to give you more fluid coming off and better control of the pressures.   Keep up the good work with the weights and definitely try the water aerobics.   Exercise to Stay Healthy Exercise helps you become and stay healthy. EXERCISE IDEAS AND TIPS Choose exercises that:  You enjoy.  Fit into your day. You do not need to exercise really hard to be healthy. You can do exercises at a slow or medium level and stay healthy. You can:  Stretch before and after working out.  Try yoga, Pilates, or tai chi.  Lift weights.  Walk fast, swim, jog, run, climb stairs, bicycle, dance, or rollerskate.  Take aerobic classes. Exercises that burn about 150 calories:  Running 1  miles in 15 minutes.  Playing volleyball for 45 to 60 minutes.  Washing and waxing a car for 45 to 60 minutes.  Playing touch football for 45 minutes.  Walking 1  miles in 35 minutes.  Pushing a stroller 1  miles in 30 minutes.  Playing basketball for 30 minutes.  Raking leaves for 30 minutes.  Bicycling 5 miles in 30 minutes.  Walking 2 miles in 30 minutes.  Dancing for 30 minutes.  Shoveling snow for 15 minutes.  Swimming laps for 20 minutes.  Walking up stairs for 15 minutes.  Bicycling 4 miles in 15 minutes.  Gardening for 30 to 45 minutes.  Jumping rope for 15 minutes.  Washing windows or floors for 45 to 60 minutes. Document Released: 07/30/2010 Document Revised: 09/19/2011 Document Reviewed: 07/30/2010 [facility] Patient Information 2015 [place], [place]. This information is not intended to replace advice given to you by your health care provider. Make sure you discuss any questions you have with your health care provider.

## 2015-01-21 NOTE — Progress Notes (Signed)
   Subjective:    Patient ID: Holly BlackbirdAngela W Orlowski, female    DOB: Dec 04, 1955, 59 y.o.   MRN: [id number]  HPI The patient is a 59 YO female coming in about her blood pressure. She is currently taking 1 pill of the lisinopril/hctz daily (should be taking 2 daily per list) and is having high blood pressures at home. Was 160s/90s at home yesterday and have been high the last several weeks. No headaches or chest pains or SOB. Denies feeling bad. Denies known complications. She has struggled with control of her pressures for several years. She knows that her weight plays into it and is working on losing some weight.   [facility]H, [facility], social history reviewed and updated.   Review of Systems  Constitutional: Negative for fever, activity change, appetite change, fatigue and unexpected weight change.  HENT: Negative.   Eyes: Negative.   Respiratory: Negative for cough, chest tightness, shortness of breath and wheezing.   Cardiovascular: Positive for leg swelling. Negative for chest pain and palpitations.  Gastrointestinal: Negative for nausea, abdominal pain, diarrhea, constipation and abdominal distention.  Musculoskeletal: Positive for myalgias and arthralgias. Negative for back pain and gait problem.  Skin: Negative.   Neurological: Negative.   Psychiatric/Behavioral: Negative.       Objective:   Physical Exam  Constitutional: She is oriented to person, place, and time. She appears well-developed and well-nourished.  Overweight  HENT:  Head: Normocephalic and atraumatic.  Eyes: EOM are normal.  Neck: Normal range of motion.  Cardiovascular: Normal rate and regular rhythm.   No murmur heard. Pulmonary/Chest: Effort normal and breath sounds normal.  Abdominal: Soft. She exhibits no distension. There is no tenderness. There is no rebound.  Musculoskeletal: She exhibits no edema.  Neurological: She is alert and oriented to person, place, and time. Coordination normal.  Skin: Skin is warm and dry.    Psychiatric: She has a normal mood and affect.   Filed Vitals:   01/21/15 0907  BP: 144/94  Pulse: 76  Temp: 98.9 F (37.2 C)  TempSrc: Oral  Resp: 14  Height: 5\' 5"  (1.651 m)  Weight: 317 lb 12.8 oz (144.153 kg)  SpO2: 96%      Assessment & Plan:

## 2015-04-13 ENCOUNTER — Ambulatory Visit: Payer: Medicare Other | Admitting: Internal Medicine

## 2015-04-13 DIAGNOSIS — Z0289 Encounter for other administrative examinations: Secondary | ICD-10-CM

## 2015-04-20 ENCOUNTER — Telehealth: Payer: Self-pay

## 2015-04-20 NOTE — Telephone Encounter (Signed)
LVM to intro AWV and to schedule and apt to completed prior to CPE on 10/24

## 2015-04-21 NOTE — Telephone Encounter (Signed)
Call back to home number and left message for call back; apt 1024 and can schedule around her apt time if needed. To check health coach schedule prior to confirming if she returns the call

## 2015-04-24 ENCOUNTER — Emergency Department (HOSPITAL_COMMUNITY)
Admission: EM | Admit: 2015-04-24 | Discharge: 2015-04-24 | Disposition: A | Discharge status: Home / Self Care | Payer: Medicare Other | Attending: Emergency Medicine | Admitting: Emergency Medicine

## 2015-04-24 ENCOUNTER — Emergency Department (HOSPITAL_COMMUNITY): Payer: Medicare Other

## 2015-04-24 ENCOUNTER — Encounter (HOSPITAL_COMMUNITY): Payer: Self-pay | Admitting: Nurse Practitioner

## 2015-04-24 DIAGNOSIS — Z87891 Personal history of nicotine dependence: Secondary | ICD-10-CM | POA: Diagnosis not present

## 2015-04-24 DIAGNOSIS — Z862 Personal history of diseases of the blood and blood-forming organs and certain disorders involving the immune mechanism: Secondary | ICD-10-CM | POA: Diagnosis not present

## 2015-04-24 DIAGNOSIS — Z8719 Personal history of other diseases of the digestive system: Secondary | ICD-10-CM | POA: Insufficient documentation

## 2015-04-24 DIAGNOSIS — B308 Other viral conjunctivitis: Secondary | ICD-10-CM | POA: Diagnosis not present

## 2015-04-24 DIAGNOSIS — Z79899 Other long term (current) drug therapy: Secondary | ICD-10-CM | POA: Insufficient documentation

## 2015-04-24 DIAGNOSIS — Z791 Long term (current) use of non-steroidal anti-inflammatories (NSAID): Secondary | ICD-10-CM | POA: Insufficient documentation

## 2015-04-24 DIAGNOSIS — Z7951 Long term (current) use of inhaled steroids: Secondary | ICD-10-CM | POA: Diagnosis not present

## 2015-04-24 DIAGNOSIS — Z91040 Latex allergy status: Secondary | ICD-10-CM | POA: Diagnosis not present

## 2015-04-24 DIAGNOSIS — I10 Essential (primary) hypertension: Secondary | ICD-10-CM | POA: Diagnosis not present

## 2015-04-24 DIAGNOSIS — M25552 Pain in left hip: Secondary | ICD-10-CM | POA: Insufficient documentation

## 2015-04-24 DIAGNOSIS — M1712 Unilateral primary osteoarthritis, left knee: Secondary | ICD-10-CM | POA: Diagnosis not present

## 2015-04-24 DIAGNOSIS — M25559 Pain in unspecified hip: Secondary | ICD-10-CM

## 2015-04-24 DIAGNOSIS — J45909 Unspecified asthma, uncomplicated: Secondary | ICD-10-CM | POA: Diagnosis not present

## 2015-04-24 DIAGNOSIS — E669 Obesity, unspecified: Secondary | ICD-10-CM | POA: Insufficient documentation

## 2015-04-24 MED ORDER — HYDROCODONE-ACETAMINOPHEN 5-325 MG PO TABS
2.0000 | ORAL_TABLET | Freq: Once | ORAL | Status: AC
  Administered 2015-04-24: 2 via ORAL
  Filled 2015-04-24: qty 2

## 2015-04-24 MED ORDER — HYDROCODONE-ACETAMINOPHEN 5-325 MG PO TABS: 1.0000 | ORAL_TABLET | Freq: Four times a day (QID) | ORAL | Status: DC | PRN

## 2015-04-24 NOTE — ED Provider Notes (Signed)
CSN: [id number]     Arrival date & time 04/24/15  1641 History  By signing my name below, I, Soijett Blue, attest that this documentation has been prepared under the direction and in the presence of Roxy Horseman, PA-C Electronically Signed: Soijett Blue, ED Scribe. 04/24/2015. 5:30 PM.   Chief Complaint  Patient presents with  . Leg Pain      The history is provided by the patient. No language interpreter was used.    [name] is a 59 y.o. female with a medical hx of DJD in bilateral knees with L>R and HTN who presents to the Emergency Department complaining of progressively worsening left hip pain onset 4 months. She notes that she has been seen for her pain in the past with a negative workup, but her pain has gotten worse. She reports that she has had pain to her left leg since she had her left knee surgery that was completed by Dr. Myrtie Neither.She reports that when she tries to get up her left upper leg muscles will tighten and she is unable to move at first until the pain subsides. She reports that her left leg will intermittently "lock up" on her and that there is an increased amount of pain with ambulating and weight bearing. She notes that her left upper leg pain is relieved with massage. She denies any injury/trauma to the left upper leg. She states that she has been seen by an orthopedist and physical therapist with no relief of her symptoms. She notes that she has not tried any medications for the relief of her symptoms. She denies color change, wound, rash, swelling, and any other symptoms.    Past Medical History  Diagnosis Date  . Hypertension   . Hyperlipidemia   . Asthma   . Obesity   . Lower extremity edema     Chronic. 2D echo (2005) - EF 65%.  . Seasonal allergies   . Abnormal EKG 06/2003    History of inverted T waves V1-V3. Normal 2D echo (07/23/2003): LVEF 65%.  . History of multiple pulmonary nodules     Incidental finding: CT Abd/ Pelvis (04/2010) -  Several small lower lobe lung nodules, including one pure ground-glass pulmonary nodule measuring 8 mm in the left lower lobe.  Recommend follow-up chest CT (IV contrast preferred) in 6 months to document stability. //  CT Abd/ Pelvis (07/2010) -  3 mm RLL and  8 mm LLL nodule stable.  Other nodules unchanged, likely benign.  . 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 Followed by Sports Medicine, Dr. Jennette Kettle.  . Incarcerated ventral hernia 04/2010    Noted on CT Abd/ pelvis (04/2010). Patient now s/p ventral hernia repair by Dr. Gerrit Friends (12/2010)  . Anemia     BL Hgb 11-12. Ferritin 14 - low normal (08/2007). Colonoscopy 2009 - external hemorrhoids (excellent prep). Last anemia panel (12/2010) - Iron  24, TIBC 269,  B12  316, Folate 11.9, Ferritin 73.  . Wears dentures   . Wears glasses   . Knee osteoarthritis     s/p Left total knee replacement (06/2011)   Past Surgical History  Procedure Laterality Date  . Incision and drainage abscess anal  07/2008    I&D and debridgement of anorectal abscess  . Inguinal hernia repair    . Tubal ligation    . Incisional hernia repair  12/2010    Repair of incarcerated ventral incisional hernia with Ethicon mesh patch -  performed by Dr. Gerrit Friends.   . Foot surgery  2004     left  . Total knee arthroplasty  06/16/2011    Left TOTAL KNEE ARTHROPLASTY;  Surgeon: Kennieth Rad;  Location: [facility] OR;  Service: Orthopedics;  Laterality: Left;  left total knee arthroplasty  . Joint replacement Left 2013    left knee   Family History  Problem Relation Age of Onset  . Diabetes Mother   . Hypertension Mother   . Stroke Mother   . Heart attack Neg Hx   . Dementia Father   . Heart disease Father   . Rashes / Skin problems Maternal Grandfather   . Hypertension Sister   . Diabetes Brother   . Hypertension Brother    Social History  Substance Use Topics  . Smoking status: Former Smoker -- 0.50 packs/day for 20 years    Types: Cigarettes     Quit date: 09/10/1998  . Smokeless tobacco: Never Used  . Alcohol Use: No   OB History    No data available     Review of Systems  Musculoskeletal: Positive for arthralgias. Negative for joint swelling.  Skin: Negative for color change, rash and wound.      Allergies  Ketorolac tromethamine; Atrovent; and Latex  Home Medications   Prior to Admission medications   Medication Sig Start Date End Date Taking? Authorizing Provider  albuterol (PROVENTIL HFA;VENTOLIN HFA) 108 (90 BASE) MCG/ACT inhaler Inhale 08/06/14   Kendrick Ranch, MD  diclofenac (VOLTAREN) 75 MG EC tablet Take 1 tablet (75 mg total) by mouth 2 (two) times daily. 10/13/14   Lenda Kelp, MD  HYDROcodone-acetaminophen (NORCO/VICODIN) 5-325 MG per tablet Take 1 tablet by mouth every 6 (six) hours as needed for moderate pain. 10/13/14   Lenda Kelp, MD  lisinopril-hydrochlorothiazide (ZESTORETIC) 20-12.5 MG per tablet Take 2 tablets by mouth daily. 05/13/14 05/13/15  Ejiroghene Wendall Stade, MD  methocarbamol (ROBAXIN) 500 MG tablet Take 1 tablet (500 mg total) by mouth every 8 (eight) hours as needed for muscle spasms. 10/13/14   Lenda Kelp, MD  mometasone-formoterol (DULERA) 100-5 MCG/ACT AERO 2 inhalations bid 09/16/14   Kendrick Ranch, MD   BP 151/63 mmHg  Pulse 66  Temp(Src) 98.5 F (36.9 C) (Oral)  Resp 20  SpO2 94%  LMP 09/09/2009 Physical Exam  Constitutional: She is oriented to person, place, and time. She appears well-developed and well-nourished. No distress.  HENT:  Head: Normocephalic and atraumatic.  Eyes: EOM are normal.  Neck: Neck supple.  Cardiovascular: Normal rate.   Pulmonary/Chest: Effort normal. No respiratory distress.  Musculoskeletal: Normal range of motion.  ROM and strength 5/5 No bony abnormality or deformity  Neurological: She is alert and oriented to person, place, and time.  Skin: Skin is warm and dry.  Psychiatric: She has a normal mood and affect. Her behavior is  normal.  Nursing note and vitals reviewed.   ED Course  Procedures (including critical care time) DIAGNOSTIC STUDIES: Oxygen Saturation is 94% on RA, adequate by my interpretation.    COORDINATION OF CARE: 5:23 PM Discussed treatment plan with pt at bedside which includes left hip xray, norco and pt agreed to plan.    Imaging Review Dg Hip Unilat With Pelvis 2-3 Views Left  04/24/2015  CLINICAL DATA:  Left hip pain for 5 months. No known injury. Initial encounter. EXAM: DG HIP (WITH OR WITHOUT PELVIS) 2-3V LEFT COMPARISON:  None. FINDINGS: No acute bony or joint abnormality is  identified. No evidence of avascular necrosis is seen. Joint spaces are preserved. No focal bony lesion is noted. Symphysis pubis, sacroiliac joints and visualized lower lumbar spine are unremarkable. IMPRESSION: Negative exam. Electronically Signed   By: Drusilla Kannerhomas  Dalessio M.D.   On: 04/24/2015 18:08   I have personally reviewed and evaluated these images as part of my medical decision-making.    MDM   Final diagnoses:  Left hip pain   Patient with left hip pain. This is a chronic condition. There is no evidence of new traumatic injury. No evidence of septic arthritis. Vital signs are stable. Plain films are negative, patient is ambulatory, she will need to follow-up with orthopedics. Patient understands and agrees with the plan.  I, Silas Muff, personally performed the services described in this documentation. All medical record entries made by the scribe were at my direction and in my presence.  I have reviewed the chart and discharge instructions and agree that the record reflects my personal performance and is accurate and complete. Alizae Bechtel.  04/24/2015. 9:41 PM.      Roxy Horsemanobert Javeion Cannedy, PA-C 04/24/15 [phone]  Glynn OctaveStephen Rancour, MD 04/24/15 2342

## 2015-04-24 NOTE — ED Notes (Signed)
She c/o L upper leg pain increasingly worse over past 4 months, she was seen her for this pain in past and had an xray but the pain has gotten worse. She states sometimes her leg "locks up." pain increased with walking, weight bearing, pain improved with massage. She denies any injuries

## 2015-04-24 NOTE — Discharge Instructions (Signed)

## 2015-04-24 NOTE — ED Notes (Signed)
Pt gone for xray

## 2015-04-27 ENCOUNTER — Telehealth: Payer: Self-pay | Admitting: Internal Medicine

## 2015-04-27 NOTE — Telephone Encounter (Signed)
Pt called and was asking about a health questionnaire that she dropped off, do know the status of this?

## 2015-04-29 DIAGNOSIS — Z7689 Persons encountering health services in other specified circumstances: Secondary | ICD-10-CM

## 2015-05-04 ENCOUNTER — Other Ambulatory Visit (INDEPENDENT_AMBULATORY_CARE_PROVIDER_SITE_OTHER): Payer: Medicare Other

## 2015-05-04 ENCOUNTER — Ambulatory Visit (INDEPENDENT_AMBULATORY_CARE_PROVIDER_SITE_OTHER): Payer: Medicare Other | Admitting: Internal Medicine

## 2015-05-04 ENCOUNTER — Encounter: Payer: Self-pay | Admitting: Internal Medicine

## 2015-05-04 VITALS — BP 148/94 | HR 63 | Temp 98.9°F | Resp 16 | Ht 65.0 in | Wt 308.0 lb

## 2015-05-04 DIAGNOSIS — M150 Primary generalized (osteo)arthritis: Secondary | ICD-10-CM

## 2015-05-04 DIAGNOSIS — M25552 Pain in left hip: Secondary | ICD-10-CM | POA: Diagnosis not present

## 2015-05-04 DIAGNOSIS — Z79899 Other long term (current) drug therapy: Secondary | ICD-10-CM

## 2015-05-04 DIAGNOSIS — Z0000 Encounter for general adult medical examination without abnormal findings: Secondary | ICD-10-CM | POA: Diagnosis not present

## 2015-05-04 DIAGNOSIS — M159 Polyosteoarthritis, unspecified: Secondary | ICD-10-CM

## 2015-05-04 DIAGNOSIS — Z23 Encounter for immunization: Secondary | ICD-10-CM

## 2015-05-04 DIAGNOSIS — M8949 Other hypertrophic osteoarthropathy, multiple sites: Secondary | ICD-10-CM

## 2015-05-04 LAB — HEMOGLOBIN A1C: Hgb A1c MFr Bld: 5.7 % (ref 4.6–6.5)

## 2015-05-04 LAB — COMPREHENSIVE METABOLIC PANEL
ALT: 7 U/L (ref 0–35)
AST: 10 U/L (ref 0–37)
Albumin: 3.9 g/dL (ref 3.5–5.2)
Alkaline Phosphatase: 95 U/L (ref 39–117)
BUN: 11 mg/dL (ref 6–23)
CO2: 28 mEq/L (ref 19–32)
Calcium: 10 mg/dL (ref 8.4–10.5)
Chloride: 107 mEq/L (ref 96–112)
Creatinine, Ser: 0.69 mg/dL (ref 0.40–1.20)
GFR: 112.34 mL/min (ref >60.00)
Glucose, Bld: 88 mg/dL (ref 70–99)
Potassium: 3.8 mEq/L (ref 3.5–5.1)
Sodium: 142 mEq/L (ref 135–145)
Total Bilirubin: 0.5 mg/dL (ref 0.2–1.2)
Total Protein: 7 g/dL (ref 6.0–8.3)

## 2015-05-04 LAB — LIPID PANEL
Cholesterol: 189 mg/dL (ref 0–200)
HDL: 42.5 mg/dL (ref >39.00)
LDL Cholesterol: 126 mg/dL — ABNORMAL HIGH (ref 0–99)
NonHDL: 146.91
Total CHOL/HDL Ratio: 4
Triglycerides: 103 mg/dL (ref 0.0–149.0)
VLDL: 20.6 mg/dL (ref 0.0–40.0)

## 2015-05-04 LAB — VITAMIN B12: Vitamin B-12: 175 pg/mL — ABNORMAL LOW (ref 211–911)

## 2015-05-04 MED ORDER — METHYLPREDNISOLONE ACETATE 40 MG/ML IJ SUSP
40.0000 mg | Freq: Once | INTRAMUSCULAR | Status: AC
  Administered 2015-05-04: 40 mg via INTRAMUSCULAR

## 2015-05-04 NOTE — Progress Notes (Signed)
Pre visit review using our clinic review tool, if applicable. No additional management support is needed unless otherwise documented below in the visit note. 

## 2015-05-04 NOTE — Patient Instructions (Signed)
We are checking the labs today and will get you in with the orthopedic surgeon.   For the pap smear we generally have our patients go to a gynecologist, [facility] and [facility] both are taking new patients and have many good physicians.   Health Maintenance, Female Adopting a healthy lifestyle and getting preventive care can go a long way to promote health and wellness. Talk with your health care provider about what schedule of regular examinations is right for you. This is a good chance for you to check in with your provider about disease prevention and staying healthy. In between checkups, there are plenty of things you can do on your own. Experts have done a lot of research about which lifestyle changes and preventive measures are most likely to keep you healthy. Ask your health care provider for more information. WEIGHT AND DIET  Eat a healthy diet  Be sure to include plenty of vegetables, fruits, low-fat dairy products, and lean protein.  Do not eat a lot of foods high in solid fats, added sugars, or salt.  Get regular exercise. This is one of the most important things you can do for your health.  Most adults should exercise for at least 150 minutes each week. The exercise should increase your heart rate and make you sweat (moderate-intensity exercise).  Most adults should also do strengthening exercises at least twice a week. This is in addition to the moderate-intensity exercise.  Maintain a healthy weight  Body mass index (BMI) is a measurement that can be used to identify possible weight problems. It estimates body fat based on height and weight. Your health care provider can help determine your BMI and help you achieve or maintain a healthy weight.  For females 51 years of age and older:   A BMI below 18.5 is considered underweight.  A BMI of 18.5 to 24.9 is normal.  A BMI of 25 to 29.9 is considered overweight.  A BMI of 30 and above is considered obese.   Watch levels of cholesterol and blood lipids  You should start having your blood tested for lipids and cholesterol at 58 years of age, then have this test every 5 years.  You may need to have your cholesterol levels checked more often if:  Your lipid or cholesterol levels are high.  You are older than 58 years of age.  You are at high risk for heart disease.  CANCER SCREENING   Lung Cancer  Lung cancer screening is recommended for adults 3-21 years old who are at high risk for lung cancer because of a history of smoking.  A yearly low-dose CT scan of the lungs is recommended for people who:  Currently smoke.  Have quit within the past 15 years.  Have at least a 30-pack-year history of smoking. A pack year is smoking an average of one pack of cigarettes a day for 1 year.  Yearly screening should continue until it has been 15 years since you quit.  Yearly screening should stop if you develop a health problem that would prevent you from having lung cancer treatment.  Breast Cancer  Practice breast self-awareness. This means understanding how your breasts normally appear and feel.  It also means doing regular breast self-exams. Let your health care provider know about any changes, no matter how small.  If you are in your 20s or 30s, you should have a clinical breast exam (CBE) by a health care provider every 1-3 years as part  of a regular health exam.  If you are 71 or older, have a CBE every year. Also consider having a breast X-ray (mammogram) every year.  If you have a family history of breast cancer, talk to your health care provider about genetic screening.  If you are at high risk for breast cancer, talk to your health care provider about having an MRI and a mammogram every year.  Breast cancer gene (BRCA) assessment is recommended for women who have family members with BRCA-related cancers. BRCA-related cancers  include:  Breast.  Ovarian.  Tubal.  Peritoneal cancers.  Results of the assessment will determine the need for genetic counseling and BRCA1 and BRCA2 testing. Cervical Cancer Your health care provider may recommend that you be screened regularly for cancer of the pelvic organs (ovaries, uterus, and vagina). This screening involves a pelvic examination, including checking for microscopic changes to the surface of your cervix (Pap test). You may be encouraged to have this screening done every 3 years, beginning at age 17.  For women ages 52-65, health care providers may recommend pelvic exams and Pap testing every 3 years, or they may recommend the Pap and pelvic exam, combined with testing for human papilloma virus (HPV), every 5 years. Some types of HPV increase your risk of cervical cancer. Testing for HPV may also be done on women of any age with unclear Pap test results.  Other health care providers may not recommend any screening for nonpregnant women who are considered low risk for pelvic cancer and who do not have symptoms. Ask your health care provider if a screening pelvic exam is right for you.  If you have had past treatment for cervical cancer or a condition that could lead to cancer, you need Pap tests and screening for cancer for at least 20 years after your treatment. If Pap tests have been discontinued, your risk factors (such as having a new sexual partner) need to be reassessed to determine if screening should resume. Some women have medical problems that increase the chance of getting cervical cancer. In these cases, your health care provider may recommend more frequent screening and Pap tests. Colorectal Cancer  This type of cancer can be detected and often prevented.  Routine colorectal cancer screening usually begins at 58 years of age and continues through 58 years of age.  Your health care provider may recommend screening at an earlier age if you have risk factors for  colon cancer.  Your health care provider may also recommend using home test kits to check for hidden blood in the stool.  A small camera at the end of a tube can be used to examine your colon directly (sigmoidoscopy or colonoscopy). This is done to check for the earliest forms of colorectal cancer.  Routine screening usually begins at age 37.  Direct examination of the colon should be repeated every 5-10 years through 58 years of age. However, you may need to be screened more often if early forms of precancerous polyps or small growths are found. Skin Cancer  Check your skin from head to toe regularly.  Tell your health care provider about any new moles or changes in moles, especially if there is a change in a mole's shape or color.  Also tell your health care provider if you have a mole that is larger than the size of a pencil eraser.  Always use sunscreen. Apply sunscreen liberally and repeatedly throughout the day.  Protect yourself by wearing long sleeves, pants, a  wide-brimmed hat, and sunglasses whenever you are outside. HEART DISEASE, DIABETES, AND HIGH BLOOD PRESSURE   High blood pressure causes heart disease and increases the risk of stroke. High blood pressure is more likely to develop in:  People who have blood pressure in the high end of the normal range (130-139/85-89 mm Hg).  People who are overweight or obese.  People who are African American.  If you are 48-71 years of age, have your blood pressure checked every 3-5 years. If you are 65 years of age or older, have your blood pressure checked every year. You should have your blood pressure measured twice--once when you are at a hospital or clinic, and once when you are not at a hospital or clinic. Record the average of the two measurements. To check your blood pressure when you are not at a hospital or clinic, you can use:  An automated blood pressure machine at a pharmacy.  A home blood pressure monitor.  If you  are between 71 years and 73 years old, ask your health care provider if you should take aspirin to prevent strokes.  Have regular diabetes screenings. This involves taking a blood sample to check your fasting blood sugar level.  If you are at a normal weight and have a low risk for diabetes, have this test once every three years after 58 years of age.  If you are overweight and have a high risk for diabetes, consider being tested at a younger age or more often. PREVENTING INFECTION  Hepatitis B  If you have a higher risk for hepatitis B, you should be screened for this virus. You are considered at high risk for hepatitis B if:  You were born in a country where hepatitis B is common. Ask your health care provider which countries are considered high risk.  Your parents were born in a high-risk country, and you have not been immunized against hepatitis B (hepatitis B vaccine).  You have HIV or AIDS.  You use needles to inject street drugs.  You live with someone who has hepatitis B.  You have had sex with someone who has hepatitis B.  You get hemodialysis treatment.  You take certain medicines for conditions, including cancer, organ transplantation, and autoimmune conditions. Hepatitis C  Blood testing is recommended for:  Everyone born from [birth year] through 1964.  Anyone with known risk factors for hepatitis C. Sexually transmitted infections (STIs)  You should be screened for sexually transmitted infections (STIs) including gonorrhea and chlamydia if:  You are sexually active and are younger than 58 years of age.  You are older than 58 years of age and your health care provider tells you that you are at risk for this type of infection.  Your sexual activity has changed since you were last screened and you are at an increased risk for chlamydia or gonorrhea. Ask your health care provider if you are at risk.  If you do not have HIV, but are at risk, it may be recommended that you  take a prescription medicine daily to prevent HIV infection. This is called pre-exposure prophylaxis (PrEP). You are considered at risk if:  You are sexually active and do not regularly use condoms or know the HIV status of your partner(s).  You take drugs by injection.  You are sexually active with a partner who has HIV. Talk with your health care provider about whether you are at high risk of being infected with HIV. If you choose to begin PrEP,  you should first be tested for HIV. You should then be tested every 3 months for as long as you are taking PrEP.  PREGNANCY   If you are premenopausal and you may become pregnant, ask your health care provider about preconception counseling.  If you may become pregnant, take 400 to 800 micrograms (mcg) of folic acid every day.  If you want to prevent pregnancy, talk to your health care provider about birth control (contraception). OSTEOPOROSIS AND MENOPAUSE   Osteoporosis is a disease in which the bones lose minerals and strength with aging. This can result in serious bone fractures. Your risk for osteoporosis can be identified using a bone density scan.  If you are 90 years of age or older, or if you are at risk for osteoporosis and fractures, ask your health care provider if you should be screened.  Ask your health care provider whether you should take a calcium or vitamin D supplement to lower your risk for osteoporosis.  Menopause may have certain physical symptoms and risks.  Hormone replacement therapy may reduce some of these symptoms and risks. Talk to your health care provider about whether hormone replacement therapy is right for you.  HOME CARE INSTRUCTIONS   Schedule regular health, dental, and eye exams.  Stay current with your immunizations.   Do not use any tobacco products including cigarettes, chewing tobacco, or electronic cigarettes.  If you are pregnant, do not drink alcohol.  If you are breastfeeding, limit how  much and how often you drink alcohol.  Limit alcohol intake to no more than 1 drink per day for nonpregnant women. One drink equals 12 ounces of beer, 5 ounces of wine, or 1 ounces of hard liquor.  Do not use street drugs.  Do not share needles.  Ask your health care provider for help if you need support or information about quitting drugs.  Tell your health care provider if you often feel depressed.  Tell your health care provider if you have ever been abused or do not feel safe at home.   This information is not intended to replace advice given to you by your health care provider. Make sure you discuss any questions you have with your health care provider.   Document Released: 01/10/2011 Document Revised: 07/18/2014 Document Reviewed: 05/29/2013 Elsevier Interactive Patient Education [employer].

## 2015-05-06 ENCOUNTER — Telehealth: Payer: Self-pay | Admitting: Internal Medicine

## 2015-05-06 ENCOUNTER — Encounter: Payer: Self-pay | Admitting: Internal Medicine

## 2015-05-06 NOTE — Telephone Encounter (Signed)
Patient called to follow up on referral call that she received from helen. There are no notes in the referral at this point. Please give her a call. No time preference

## 2015-05-06 NOTE — Progress Notes (Signed)
   Subjective:    Patient ID: [name], female    DOB: 1956/02/13, 59 y.o.   MRN: [id number]  HPI The patient is a 59 YO female coming in for medicare wellness. Still struggling with her joint pains and would like to see an orthopedic.  Diet: heart healthy Physical activity: sedentary Depression/mood screen: negative Hearing: intact to whispered voice Visual acuity: grossly normal, performs annual eye exam  ADLs: capable Fall risk: none Home safety: good Cognitive evaluation: intact to orientation, naming, recall and repetition EOL planning: adv directives discussed  I have personally reviewed and have noted 1. The patient's medical and social history - reviewed today no changes 2. Their use of alcohol, tobacco or illicit drugs 3. Their current medications and supplements 4. The patient's functional ability including ADL's, fall risks, home safety risks and hearing or visual impairment. 5. Diet and physical activities 6. Evidence for depression or mood disorders 7. Care team reviewed and updated (available in snapshot)  Review of Systems  Constitutional: Negative for fever, activity change, appetite change, fatigue and unexpected weight change.  HENT: Negative.   Eyes: Negative.   Respiratory: Negative for cough, chest tightness, shortness of breath and wheezing.   Cardiovascular: Negative for chest pain, palpitations and leg swelling.  Gastrointestinal: Negative for nausea, abdominal pain, diarrhea, constipation and abdominal distention.  Musculoskeletal: Positive for myalgias and arthralgias. Negative for back pain and gait problem.  Skin: Negative.   Neurological: Negative.   Psychiatric/Behavioral: Negative.       Objective:   Physical Exam  Constitutional: She is oriented to person, place, and time. She appears well-developed and well-nourished.  Overweight  HENT:  Head: Normocephalic and atraumatic.  Eyes: EOM are normal.  Neck: Normal range of motion.    Cardiovascular: Normal rate and regular rhythm.   No murmur heard. Pulmonary/Chest: Effort normal and breath sounds normal.  Abdominal: Soft. She exhibits no distension. There is no tenderness. There is no rebound.  Musculoskeletal: She exhibits no edema.  Neurological: She is alert and oriented to person, place, and time. Coordination normal.  Skin: Skin is warm and dry.  Psychiatric: She has a normal mood and affect.   Filed Vitals:   05/04/15 1510  BP: 148/94  Pulse: 63  Temp: 98.9 F (37.2 C)  TempSrc: Oral  Resp: 16  Height: 5\' 5"  (1.651 m)  Weight: 308 lb (139.708 kg)  SpO2: 95%      Assessment & Plan:  Flu shot given at visit.

## 2015-05-06 NOTE — Assessment & Plan Note (Signed)
Referral to orthopedics today 

## 2015-05-06 NOTE — Assessment & Plan Note (Signed)
Given flu shot today to update immunizations. Referred to gyn for pap smear and reminded about mammogram. Colonoscopy is up to date. Counseled her about the need for weight loss and increased exercise. 10 year screening recommendations given to patient at visit.

## 2015-05-07 NOTE — Telephone Encounter (Signed)
Pt is scheduled w/ Dr. Linna CapriceSwinteck.

## 2015-05-14 ENCOUNTER — Encounter (HOSPITAL_COMMUNITY): Payer: Self-pay | Admitting: Emergency Medicine

## 2015-05-14 ENCOUNTER — Emergency Department (INDEPENDENT_AMBULATORY_CARE_PROVIDER_SITE_OTHER)
Admission: EM | Admit: 2015-05-14 | Discharge: 2015-05-14 | Disposition: A | Discharge status: Home / Self Care | Payer: Medicare Other | Source: Home / Self Care | Attending: Family Medicine | Admitting: Family Medicine

## 2015-05-14 DIAGNOSIS — N390 Urinary tract infection, site not specified: Secondary | ICD-10-CM

## 2015-05-14 DIAGNOSIS — M546 Pain in thoracic spine: Secondary | ICD-10-CM

## 2015-05-14 LAB — POCT URINALYSIS DIP (DEVICE)
Bilirubin Urine: NEGATIVE
Glucose, UA: NEGATIVE mg/dL
Ketones, ur: NEGATIVE mg/dL
Nitrite: NEGATIVE
Protein, ur: 100 mg/dL — AB
Specific Gravity, Urine: 1.025 (ref 1.005–1.030)
Urobilinogen, UA: 0.2 mg/dL (ref 0.0–1.0)
pH: 7 (ref 5.0–8.0)

## 2015-05-14 MED ORDER — CEPHALEXIN 500 MG PO CAPS: 500.0000 mg | ORAL_CAPSULE | Freq: Four times a day (QID) | ORAL | Status: DC

## 2015-05-14 NOTE — ED Provider Notes (Signed)
CSN: [id number]     Arrival date & time 05/14/15  1915 History   First MD Initiated Contact with Patient 05/14/15 2034     Chief Complaint  Patient presents with  . Urinary Tract Infection   (Consider location/radiation/quality/duration/timing/severity/associated sxs/prior Treatment) HPI Comments: 59 year old female complaining of urinary frequency, pelvic pressure, gross hematuria, dysuria and urinary urgency since yesterday. She states she has the symptoms proximally every 2-3 months. She also complains of mid to lower back pain. It is worse with movement, bending and twisting. No known injury, fall or trauma.   Past Medical History  Diagnosis Date  . Hypertension   . Hyperlipidemia   . Asthma   . Obesity   . Lower extremity edema     Chronic. 2D echo (2005) - EF 65%.  . Seasonal allergies   . Abnormal EKG 06/2003    History of inverted T waves V1-V3. Normal 2D echo (07/23/2003): LVEF 65%.  . History of multiple pulmonary nodules     Incidental finding: CT Abd/ Pelvis (04/2010) - Several small lower lobe lung nodules, including one pure ground-glass pulmonary nodule measuring 8 mm in the left lower lobe.  Recommend follow-up chest CT (IV contrast preferred) in 6 months to document stability. //  CT Abd/ Pelvis (07/2010) -  3 mm RLL and  8 mm LLL nodule stable.  Other nodules unchanged, likely benign.  . 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 Followed by Sports Medicine, Dr. Jennette Kettle.  . Incarcerated ventral hernia 04/2010    Noted on CT Abd/ pelvis (04/2010). Patient now s/p ventral hernia repair by Dr. Gerrit Friends (12/2010)  . Anemia     BL Hgb 11-12. Ferritin 14 - low normal (08/2007). Colonoscopy 2009 - external hemorrhoids (excellent prep). Last anemia panel (12/2010) - Iron  24, TIBC 269,  B12  316, Folate 11.9, Ferritin 73.  . Wears dentures   . Wears glasses   . Knee osteoarthritis     s/p Left total knee replacement (06/2011)   Past Surgical History   Procedure Laterality Date  . Incision and drainage abscess anal  07/2008    I&D and debridgement of anorectal abscess  . Inguinal hernia repair    . Tubal ligation    . Incisional hernia repair  12/2010    Repair of incarcerated ventral incisional hernia with Ethicon mesh patch - performed by Dr. Gerrit Friends.   . Foot surgery  2004     left  . Total knee arthroplasty  06/16/2011    Left TOTAL KNEE ARTHROPLASTY;  Surgeon: Kennieth Rad;  Location: [facility] OR;  Service: Orthopedics;  Laterality: Left;  left total knee arthroplasty  . Joint replacement Left 2013    left knee   Family History  Problem Relation Age of Onset  . Diabetes Mother   . Hypertension Mother   . Stroke Mother   . Heart attack Neg Hx   . Dementia Father   . Heart disease Father   . Rashes / Skin problems Maternal Grandfather   . Hypertension Sister   . Diabetes Brother   . Hypertension Brother    Social History  Substance Use Topics  . Smoking status: Former Smoker -- 0.50 packs/day for 20 years    Types: Cigarettes    Quit date: 09/10/1998  . Smokeless tobacco: Never Used  . Alcohol Use: No   OB History    No data available     Review of Systems  Constitutional: Positive for  activity change. Negative for fever and fatigue.  HENT: Negative.   Respiratory: Negative.   Cardiovascular: Negative.   Genitourinary: Negative for vaginal bleeding, vaginal discharge and menstrual problem.       As per history of present illness  Musculoskeletal: Positive for myalgias and back pain.  Skin: Negative.   Psychiatric/Behavioral: Negative.     Allergies  Ketorolac tromethamine; Atrovent; and Latex  Home Medications   Prior to Admission medications   Medication Sig Start Date End Date Taking? Authorizing Provider  albuterol (PROVENTIL HFA;VENTOLIN HFA) 108 (90 BASE) MCG/ACT inhaler Inhale 08/06/14   Kendrick Rancheborah D Schoenhoff, MD  cephALEXin (KEFLEX) 500 MG capsule Take 1 capsule (500 mg total) by mouth 4 (four) times  daily. 05/14/15   Hayden Rasmussenavid Channon Brougher, NP  diclofenac (VOLTAREN) 75 MG EC tablet Take 1 tablet (75 mg total) by mouth 2 (two) times daily. 10/13/14   Lenda KelpShane R Hudnall, MD  HYDROcodone-acetaminophen (NORCO/VICODIN) 5-325 MG tablet Take 1-2 tablets by mouth every 6 (six) hours as needed. 04/24/15   Roxy Horsemanobert Browning, PA-C  lisinopril-hydrochlorothiazide (ZESTORETIC) 20-12.5 MG per tablet Take 2 tablets by mouth daily. 05/13/14 05/13/15  Ejiroghene Wendall StadeE Emokpae, MD  methocarbamol (ROBAXIN) 500 MG tablet Take 1 tablet (500 mg total) by mouth every 8 (eight) hours as needed for muscle spasms. 10/13/14   Lenda KelpShane R Hudnall, MD  mometasone-formoterol (DULERA) 100-5 MCG/ACT AERO 2 inhalations bid 09/16/14   Kendrick Rancheborah D Schoenhoff, MD   Meds Ordered and Administered this Visit  Medications - No data to display  LMP 09/09/2009 No data found.   Physical Exam  Constitutional: She is oriented to person, place, and time. She appears well-developed and well-nourished. No distress.  Eyes: EOM are normal.  Neck: Normal range of motion. Neck supple.  Cardiovascular: Normal rate.   Pulmonary/Chest: Effort normal. No respiratory distress.  Musculoskeletal: She exhibits no edema.  Tenderness across the lower parathoracic musculature. No spinal tenderness.   Neurological: She is alert and oriented to person, place, and time. She exhibits normal muscle tone.  Skin: Skin is warm and dry.  Psychiatric: She has a normal mood and affect.  Nursing note and vitals reviewed.   ED Course  Procedures (including critical care time)  Labs Review Labs Reviewed  POCT URINALYSIS DIP (DEVICE) - Abnormal; Notable for the following:    Hgb urine dipstick LARGE (*)    Protein, ur 100 (*)    Leukocytes, UA SMALL (*)    All other components within normal limits    Imaging Review No results found.   Visual Acuity Review  Right Eye Distance:   Left Eye Distance:   Bilateral Distance:    Right Eye Near:   Left Eye Near:    Bilateral  Near:         MDM   1. UTI (lower urinary tract infection)   2. Bilateral thoracic back pain    Drink plenty of fluids Take the Keflex antibody as directed AZO standard for urinary tract symptoms  For muscle pain in your back may take ibuprofen or Aleve and apply heat periodically.    Hayden Rasmussenavid Corbin Falck, NP 05/14/15 2047

## 2015-05-14 NOTE — ED Notes (Signed)
Blood in urine, back pain and frequent urination

## 2015-05-14 NOTE — Discharge Instructions (Signed)
Back Pain, Adult °Back pain is very common in adults. The cause of back pain is rarely dangerous and the pain often gets better over time. The cause of your back pain may not be known. Some common causes of back pain include: °· Strain of the muscles or ligaments supporting the spine. °· Wear and tear (degeneration) of the spinal disks. °· Arthritis. °· Direct injury to the back. °For many people, back pain may return. Since back pain is rarely dangerous, most people can learn to manage this condition on their own. °HOME CARE INSTRUCTIONS °Watch your back pain for any changes. The following actions may help to lessen any discomfort you are feeling: °· Remain active. It is stressful on your back to sit or stand in one place for long periods of time. Do not sit, drive, or stand in one place for more than 30 minutes at a time. Take short walks on even surfaces as soon as you are able. Try to increase the length of time you walk each day. °· Exercise regularly as directed by your health care provider. Exercise helps your back heal faster. It also helps avoid future injury by keeping your muscles strong and flexible. °· Do not stay in bed. Resting more than 1-2 days can delay your recovery. °· Pay attention to your body when you bend and lift. The most comfortable positions are those that put less stress on your recovering back. Always use proper lifting techniques, including: °· Bending your knees. °· Keeping the load close to your body. °· Avoiding twisting. °· Find a comfortable position to sleep. Use a firm mattress and lie on your side with your knees slightly bent. If you lie on your back, put a pillow under your knees. °· Avoid feeling anxious or stressed. Stress increases muscle tension and can worsen back pain. It is important to recognize when you are anxious or stressed and learn ways to manage it, such as with exercise. °· Take medicines only as directed by your health care provider. Over-the-counter  medicines to reduce pain and inflammation are often the most helpful. Your health care provider may prescribe muscle relaxant drugs. These medicines help dull your pain so you can more quickly return to your normal activities and healthy exercise. °· Apply ice to the injured area: °· Put ice in a plastic bag. °· Place a towel between your skin and the bag. °· Leave the ice on for 20 minutes, 2-3 times a day for the first 2-3 days. After that, ice and heat may be alternated to reduce pain and spasms. °· Maintain a healthy weight. Excess weight puts extra stress on your back and makes it difficult to maintain good posture. °SEEK MEDICAL CARE IF: °· You have pain that is not relieved with rest or medicine. °· You have increasing pain going down into the legs or buttocks. °· You have pain that does not improve in one week. °· You have night pain. °· You lose weight. °· You have a fever or chills. °SEEK IMMEDIATE MEDICAL CARE IF:  °· You develop new bowel or bladder control problems. °· You have unusual weakness or numbness in your arms or legs. °· You develop nausea or vomiting. °· You develop abdominal pain. °· You feel faint. °  °This information is not intended to replace advice given to you by your health care provider. Make sure you discuss any questions you have with your health care provider. °  °Document Released: 06/27/2005 Document Revised: 07/18/2014 Document Reviewed: 10/29/2013 °Elsevier Interactive Patient Education ©2016 Elsevier   Inc.  Urinary Tract Infection Drink plenty of fluids Take the Keflex antibody as directed AZO standard for urinary tract symptoms  Urinary tract infections (UTIs) can develop anywhere along your urinary tract. Your urinary tract is your body's drainage system for removing wastes and extra water. Your urinary tract includes two kidneys, two ureters, a bladder, and a urethra. Your kidneys are a pair of bean-shaped organs. Each kidney is about the size of your fist. They are  located below your ribs, one on each side of your spine. CAUSES Infections are caused by microbes, which are microscopic organisms, including fungi, viruses, and bacteria. These organisms are so small that they can only be seen through a microscope. Bacteria are the microbes that most commonly cause UTIs. SYMPTOMS  Symptoms of UTIs may vary by age and gender of the patient and by the location of the infection. Symptoms in young women typically include a frequent and intense urge to urinate and a painful, burning feeling in the bladder or urethra during urination. Older women and men are more likely to be tired, shaky, and weak and have muscle aches and abdominal pain. A fever may mean the infection is in your kidneys. Other symptoms of a kidney infection include pain in your back or sides below the ribs, nausea, and vomiting. DIAGNOSIS To diagnose a UTI, your caregiver will ask you about your symptoms. Your caregiver will also ask you to provide a urine sample. The urine sample will be tested for bacteria and white blood cells. White blood cells are made by your body to help fight infection. TREATMENT  Typically, UTIs can be treated with medication. Because most UTIs are caused by a bacterial infection, they usually can be treated with the use of antibiotics. The choice of antibiotic and length of treatment depend on your symptoms and the type of bacteria causing your infection. HOME CARE INSTRUCTIONS  If you were prescribed antibiotics, take them exactly as your caregiver instructs you. Finish the medication even if you feel better after you have only taken some of the medication.  Drink enough water and fluids to keep your urine clear or pale yellow.  Avoid caffeine, tea, and carbonated beverages. They tend to irritate your bladder.  Empty your bladder often. Avoid holding urine for long periods of time.  Empty your bladder before and after sexual intercourse.  After a bowel movement, women  should cleanse from front to back. Use each tissue only once. SEEK MEDICAL CARE IF:   You have back pain.  You develop a fever.  Your symptoms do not begin to resolve within 3 days. SEEK IMMEDIATE MEDICAL CARE IF:   You have severe back pain or lower abdominal pain.  You develop chills.  You have nausea or vomiting.  You have continued burning or discomfort with urination. MAKE SURE YOU:   Understand these instructions.  Will watch your condition.  Will get help right away if you are not doing well or get worse.   This information is not intended to replace advice given to you by your health care provider. Make sure you discuss any questions you have with your health care provider.   Document Released: 04/06/2005 Document Revised: 03/18/2015 Document Reviewed: 08/05/2011 Elsevier Interactive Patient Education [employer]2016 Elsevier Inc.

## 2015-05-25 ENCOUNTER — Ambulatory Visit: Payer: Self-pay | Admitting: Internal Medicine

## 2015-05-25 ENCOUNTER — Ambulatory Visit: Payer: Medicare Other | Admitting: Internal Medicine

## 2015-05-25 DIAGNOSIS — M7062 Trochanteric bursitis, left hip: Secondary | ICD-10-CM | POA: Diagnosis not present

## 2015-05-25 DIAGNOSIS — M25552 Pain in left hip: Secondary | ICD-10-CM | POA: Diagnosis not present

## 2015-06-03 DIAGNOSIS — M7062 Trochanteric bursitis, left hip: Secondary | ICD-10-CM | POA: Diagnosis not present

## 2015-07-14 DIAGNOSIS — M25561 Pain in right knee: Secondary | ICD-10-CM | POA: Diagnosis not present

## 2015-07-14 DIAGNOSIS — M25461 Effusion, right knee: Secondary | ICD-10-CM | POA: Diagnosis not present

## 2015-07-14 DIAGNOSIS — M170 Bilateral primary osteoarthritis of knee: Secondary | ICD-10-CM | POA: Diagnosis not present

## 2015-08-11 ENCOUNTER — Telehealth: Payer: Self-pay | Admitting: Internal Medicine

## 2015-08-11 DIAGNOSIS — M170 Bilateral primary osteoarthritis of knee: Secondary | ICD-10-CM | POA: Diagnosis not present

## 2015-08-11 DIAGNOSIS — M25461 Effusion, right knee: Secondary | ICD-10-CM | POA: Diagnosis not present

## 2015-08-11 NOTE — Telephone Encounter (Signed)
See below

## 2015-08-11 NOTE — Telephone Encounter (Signed)
Dr. [name] will take great care of her.

## 2015-08-11 NOTE — Telephone Encounter (Signed)
Pt is asking to transfer from Dr Okey Dupre to Dr Durene Cal. I offered her Dr [name] but I told her I would check with you.

## 2015-08-12 NOTE — Telephone Encounter (Signed)
Spoke with pt she will go with Dr [name]

## 2015-09-08 DIAGNOSIS — M170 Bilateral primary osteoarthritis of knee: Secondary | ICD-10-CM | POA: Diagnosis not present

## 2015-09-08 DIAGNOSIS — M25461 Effusion, right knee: Secondary | ICD-10-CM | POA: Diagnosis not present

## 2015-09-08 DIAGNOSIS — M25561 Pain in right knee: Secondary | ICD-10-CM | POA: Diagnosis not present

## 2015-11-06 DIAGNOSIS — M1711 Unilateral primary osteoarthritis, right knee: Secondary | ICD-10-CM | POA: Diagnosis not present

## 2016-02-10 DIAGNOSIS — H0015 Chalazion left lower eyelid: Secondary | ICD-10-CM | POA: Diagnosis not present

## 2016-03-04 DIAGNOSIS — M1711 Unilateral primary osteoarthritis, right knee: Secondary | ICD-10-CM | POA: Diagnosis not present

## 2016-03-15 DIAGNOSIS — M1711 Unilateral primary osteoarthritis, right knee: Secondary | ICD-10-CM | POA: Diagnosis not present

## 2016-03-30 ENCOUNTER — Encounter: Payer: Self-pay | Admitting: Internal Medicine

## 2016-04-01 ENCOUNTER — Other Ambulatory Visit: Payer: Self-pay

## 2016-04-01 ENCOUNTER — Inpatient Hospital Stay (HOSPITAL_COMMUNITY)
Admission: EM | Admit: 2016-04-01 | Discharge: 2016-04-04 | DRG: 202 | Disposition: A | Discharge status: Home / Self Care | Payer: Medicare Other | Attending: Internal Medicine | Admitting: Internal Medicine

## 2016-04-01 ENCOUNTER — Emergency Department (HOSPITAL_COMMUNITY): Payer: Medicare Other

## 2016-04-01 ENCOUNTER — Encounter (HOSPITAL_COMMUNITY): Payer: Self-pay | Admitting: Emergency Medicine

## 2016-04-01 DIAGNOSIS — Z79899 Other long term (current) drug therapy: Secondary | ICD-10-CM

## 2016-04-01 DIAGNOSIS — E876 Hypokalemia: Secondary | ICD-10-CM | POA: Diagnosis not present

## 2016-04-01 DIAGNOSIS — D72829 Elevated white blood cell count, unspecified: Secondary | ICD-10-CM | POA: Diagnosis not present

## 2016-04-01 DIAGNOSIS — J45901 Unspecified asthma with (acute) exacerbation: Secondary | ICD-10-CM | POA: Diagnosis not present

## 2016-04-01 DIAGNOSIS — Z886 Allergy status to analgesic agent status: Secondary | ICD-10-CM | POA: Diagnosis not present

## 2016-04-01 DIAGNOSIS — I10 Essential (primary) hypertension: Secondary | ICD-10-CM | POA: Diagnosis not present

## 2016-04-01 DIAGNOSIS — Z7722 Contact with and (suspected) exposure to environmental tobacco smoke (acute) (chronic): Secondary | ICD-10-CM | POA: Diagnosis not present

## 2016-04-01 DIAGNOSIS — Z96652 Presence of left artificial knee joint: Secondary | ICD-10-CM | POA: Diagnosis present

## 2016-04-01 DIAGNOSIS — Z87891 Personal history of nicotine dependence: Secondary | ICD-10-CM | POA: Diagnosis not present

## 2016-04-01 DIAGNOSIS — Z23 Encounter for immunization: Secondary | ICD-10-CM

## 2016-04-01 DIAGNOSIS — R0602 Shortness of breath: Secondary | ICD-10-CM | POA: Diagnosis not present

## 2016-04-01 DIAGNOSIS — R911 Solitary pulmonary nodule: Secondary | ICD-10-CM | POA: Diagnosis not present

## 2016-04-01 DIAGNOSIS — M542 Cervicalgia: Secondary | ICD-10-CM | POA: Diagnosis present

## 2016-04-01 DIAGNOSIS — Z91040 Latex allergy status: Secondary | ICD-10-CM | POA: Diagnosis not present

## 2016-04-01 DIAGNOSIS — Z888 Allergy status to other drugs, medicaments and biological substances status: Secondary | ICD-10-CM

## 2016-04-01 DIAGNOSIS — E785 Hyperlipidemia, unspecified: Secondary | ICD-10-CM | POA: Diagnosis not present

## 2016-04-01 DIAGNOSIS — Z6841 Body Mass Index (BMI) 40.0 and over, adult: Secondary | ICD-10-CM

## 2016-04-01 DIAGNOSIS — D649 Anemia, unspecified: Secondary | ICD-10-CM | POA: Diagnosis present

## 2016-04-01 DIAGNOSIS — T380X5A Adverse effect of glucocorticoids and synthetic analogues, initial encounter: Secondary | ICD-10-CM | POA: Diagnosis not present

## 2016-04-01 DIAGNOSIS — R079 Chest pain, unspecified: Secondary | ICD-10-CM | POA: Diagnosis not present

## 2016-04-01 DIAGNOSIS — R05 Cough: Secondary | ICD-10-CM | POA: Diagnosis not present

## 2016-04-01 DIAGNOSIS — Z8249 Family history of ischemic heart disease and other diseases of the circulatory system: Secondary | ICD-10-CM | POA: Diagnosis not present

## 2016-04-01 DIAGNOSIS — R0789 Other chest pain: Secondary | ICD-10-CM | POA: Diagnosis not present

## 2016-04-01 MED ORDER — ALBUTEROL (5 MG/ML) CONTINUOUS INHALATION SOLN
10.0000 mg/h | INHALATION_SOLUTION | RESPIRATORY_TRACT | Status: DC
  Administered 2016-04-02: 10 mg/h via RESPIRATORY_TRACT
  Filled 2016-04-01: qty 20

## 2016-04-01 MED ORDER — HYDROCODONE-HOMATROPINE 5-1.5 MG/5ML PO SYRP
5.0000 mL | ORAL_SOLUTION | Freq: Once | ORAL | Status: AC
Start: 2016-04-02 — End: 2016-04-02
  Administered 2016-04-02: 5 mL via ORAL
  Filled 2016-04-01: qty 5

## 2016-04-01 MED ORDER — PREDNISONE 20 MG PO TABS
60.0000 mg | ORAL_TABLET | Freq: Once | ORAL | Status: AC
  Administered 2016-04-02: 60 mg via ORAL
  Filled 2016-04-01: qty 3

## 2016-04-01 MED ORDER — ALBUTEROL SULFATE (2.5 MG/3ML) 0.083% IN NEBU
INHALATION_SOLUTION | RESPIRATORY_TRACT | Status: AC
  Administered 2016-04-01: 5 mg via RESPIRATORY_TRACT
  Filled 2016-04-01: qty 6

## 2016-04-01 MED ORDER — ALBUTEROL SULFATE (2.5 MG/3ML) 0.083% IN NEBU
5.0000 mg | INHALATION_SOLUTION | Freq: Once | RESPIRATORY_TRACT | Status: AC
  Administered 2016-04-01: 5 mg via RESPIRATORY_TRACT

## 2016-04-01 NOTE — ED Provider Notes (Signed)
[facility]-EMERGENCY DEPT Provider Note   CSN: [id number] Arrival date & time: 04/01/16  2233     History   Chief Complaint Chief Complaint  Patient presents with  . Asthma  . Shortness of Breath    HPI [name] is a 60 y.o. female with a hx of asthma presents to the Emergency Department complaining of gradual, persistent, progressively worsening cough, wheezing, chest tightness onset 5 days ago.  Pt reports It has been many months since she has needed her inhaler and when she tried to use it this week realized it was empty. Patient denies previous hospitalization or intubation for her asthma. She reports her symptoms have been worsening until tonight when she just felt as if she could not breathe. No sick contacts. No recent travel. No leg swelling or calf pain. No history of DVT. She reports taking honey and warm tea which helps her cough.  Pt denies fever, chills, headache, neck pain, abdominal pain, nausea, vomiting, diarrhea, weakness, dizziness, syncope.     The history is provided by the patient and medical records. No language interpreter was used.    Past Medical History:  Diagnosis Date  . Abnormal EKG 06/2003   History of inverted T waves V1-V3. Normal 2D echo (07/23/2003): LVEF 65%.  . Anemia    BL Hgb 11-12. Ferritin 14 - low normal (08/2007). Colonoscopy 2009 - external hemorrhoids (excellent prep). Last anemia panel (12/2010) - Iron  24, TIBC 269,  B12  316, Folate 11.9, Ferritin 73.  . Asthma   . Degenerative joint disease    BL knees (L>R), lumbar spine. Followed by Sports Medicine, Dr. Jennette Kettle.  . History of multiple pulmonary nodules    Incidental finding: CT Abd/ Pelvis (04/2010) - Several small lower lobe lung nodules, including one pure ground-glass pulmonary nodule measuring 8 mm in the left lower lobe.  Recommend follow-up chest CT (IV contrast preferred) in 6 months to document stability. //  CT Abd/ Pelvis (07/2010) -  3 mm RLL and  8 mm LLL nodule stable.   Other nodules unchanged, likely benign.  . Hyperlipidemia   . Hypertension   . Incarcerated ventral hernia 04/2010   Noted on CT Abd/ pelvis (04/2010). Patient now s/p ventral hernia repair by Dr. Gerrit Friends (12/2010)  . 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 Lower extremity edema    Chronic. 2D echo (2005) - EF 65%.  . Obesity   . Seasonal allergies   . Wears dentures   . Wears glasses     Patient Active Problem List   Diagnosis Date Noted  . Asthma exacerbation 04/02/2016  . Morbid obesity with BMI of 50.0-59.9, adult (HCC) 11/21/2011  . Preventative health care 09/10/2010  . LUNG NODULE 05/10/2010  . Low back pain 01/20/2009  . Osteoarthritis 06/02/2008  . HYPERLIPIDEMIA 08/15/2006  . ANEMIA-NOS 08/15/2006  . Essential hypertension 08/11/2006  . ASTHMA 08/11/2006    Past Surgical History:  Procedure Laterality Date  . FOOT SURGERY  2004    left  . INCISION AND DRAINAGE ABSCESS ANAL  07/2008   I&D and debridgement of anorectal abscess  . INCISIONAL HERNIA REPAIR  12/2010   Repair of incarcerated ventral incisional hernia with Ethicon mesh patch - performed by Dr. Gerrit Friends.   . INGUINAL HERNIA REPAIR    . JOINT REPLACEMENT Left 2013   left knee  . TOTAL KNEE ARTHROPLASTY  06/16/2011   Left TOTAL KNEE ARTHROPLASTY;  Surgeon: Kennieth Rad;  Location: [facility] OR;  Service: Orthopedics;  Laterality: Left;  left total knee arthroplasty  . TUBAL LIGATION      OB History    No data available       Home Medications    Prior to Admission medications   Medication Sig Start Date End Date Taking? Authorizing Provider  HYDROcodone-acetaminophen (NORCO/VICODIN) 5-325 MG tablet Take 1-2 tablets by mouth every 6 (six) hours as needed. 04/24/15  Yes Roxy Horseman, PA-C  lisinopril-hydrochlorothiazide (ZESTORETIC) 20-12.5 MG per tablet Take 2 tablets by mouth daily. 05/13/14 04/02/16 Yes Ejiroghene Wendall Stade, MD  methocarbamol (ROBAXIN) 500 MG tablet Take 1  tablet (500 mg total) by mouth every 8 (eight) hours as needed for muscle spasms. 10/13/14  Yes Lenda Kelp, MD  traMADol (ULTRAM) 50 MG tablet Take 50 mg by mouth every 6 (six) hours as needed for moderate pain.   Yes Historical Provider, MD  albuterol (PROVENTIL HFA;VENTOLIN HFA) 108 (90 BASE) MCG/ACT inhaler Inhale Patient not taking: Reported on 04/02/2016 08/06/14   Kendrick Ranch, MD  cephALEXin (KEFLEX) 500 MG capsule Take 1 capsule (500 mg total) by mouth 4 (four) times daily. Patient not taking: Reported on 04/02/2016 05/14/15   Hayden Rasmussen, NP  diclofenac (VOLTAREN) 75 MG EC tablet Take 1 tablet (75 mg total) by mouth 2 (two) times daily. Patient not taking: Reported on 04/02/2016 10/13/14   Lenda Kelp, MD  mometasone-formoterol [facility]) 100-5 MCG/ACT AERO 2 inhalations bid Patient not taking: Reported on 04/02/2016 09/16/14   Kendrick Ranch, MD    Family History Family History  Problem Relation Age of Onset  . Diabetes Mother   . Hypertension Mother   . Stroke Mother   . Dementia Father   . Heart disease Father   . Hypertension Sister   . Diabetes Brother   . Hypertension Brother   . Rashes / Skin problems Maternal Grandfather   . Heart attack Neg Hx     Social History Social History  Substance Use Topics  . Smoking status: Former Smoker    Packs/day: 0.50    Years: 20.00    Types: Cigarettes    Quit date: 09/10/1998  . Smokeless tobacco: Never Used  . Alcohol use No     Allergies   Ketorolac tromethamine; Atrovent [ipratropium]; and Latex   Review of Systems Review of Systems  Respiratory: Positive for cough, chest tightness and shortness of breath.   All other systems reviewed and are negative.    Physical Exam Updated Vital Signs BP 117/64   Pulse 100   Temp 98.8 F (37.1 C) (Oral)   Resp 21   LMP 09/09/2009   SpO2 98%   Physical Exam  Constitutional: She appears well-developed and well-nourished. No distress.  HENT:  Head:  Normocephalic and atraumatic.  Right Ear: Tympanic membrane, external ear and ear canal normal.  Left Ear: Tympanic membrane, external ear and ear canal normal.  Nose: Mucosal edema present. No rhinorrhea. No epistaxis. Right sinus exhibits no maxillary sinus tenderness and no frontal sinus tenderness. Left sinus exhibits no maxillary sinus tenderness and no frontal sinus tenderness.  Mouth/Throat: Uvula is midline and mucous membranes are normal. Mucous membranes are not pale and not cyanotic. No oropharyngeal exudate, posterior oropharyngeal edema, posterior oropharyngeal erythema or tonsillar abscesses.  Eyes: Conjunctivae are normal. Pupils are equal, round, and reactive to light.  Neck: Normal range of motion and full passive range of motion without pain.  Cardiovascular: Normal rate and intact distal pulses.  Pulmonary/Chest: Effort normal. No stridor. She has decreased breath sounds. She has wheezes.  Finally wheezes throughout with significantly decreased breath sounds Course and nonproductive cough  Abdominal: Soft. There is no tenderness.  Musculoskeletal: Normal range of motion.  Lymphadenopathy:    She has no cervical adenopathy.  Neurological: She is alert.  Skin: Skin is warm and dry. No rash noted. She is not diaphoretic.  Psychiatric: She has a normal mood and affect.  Nursing note and vitals reviewed.    ED Treatments / Results  Labs (all labs ordered are listed, but only abnormal results are displayed) Labs Reviewed  CBC - Abnormal; Notable for the following:       Result Value   Hemoglobin 11.1 (*)    All other components within normal limits  BASIC METABOLIC PANEL  I-STAT TROPOININ, ED    EKG  EKG Interpretation  Date/Time:  Saturday April 02 2016 01:07:16 EDT Ventricular Rate:  109 PR Interval:  164 QRS Duration: 138 QT Interval:  361 QTC Calculation: 487 R Axis:   46 Text Interpretation:  Sinus tachycardia Nonspecific intraventricular conduction  delay no significant change since yesterday Confirmed by GOLDSTON MD, SCOTT [phone]) on 04/02/2016 2:05:36 AM       Radiology Dg Chest 2 View  Result Date: 04/01/2016 CLINICAL DATA:  Cough, fever, shortness of breath for 1 week. History of hypertension. Nonsmoker. EXAM: CHEST  2 VIEW COMPARISON:  08/23/2014 FINDINGS: The heart size and mediastinal contours are within normal limits. Both lungs are clear. The visualized skeletal structures are unremarkable. IMPRESSION: No active cardiopulmonary disease. Electronically Signed   By: Burman NievesWilliam  Stevens M.D.   On: 04/01/2016 23:40    Procedures Procedures (including critical care time)  Medications Ordered in ED Medications  albuterol (PROVENTIL,VENTOLIN) solution continuous neb (10 mg/hr Nebulization New Bag/Given 04/02/16 0018)  albuterol (PROVENTIL) (2.5 MG/3ML) 0.083% nebulizer solution 5 mg (5 mg Nebulization Given 04/01/16 2251)  predniSONE (DELTASONE) tablet 60 mg (60 mg Oral Given 04/02/16 0011)  HYDROcodone-homatropine (HYCODAN) 5-1.5 MG/5ML syrup 5 mL (5 mLs Oral Given 04/02/16 0011)     Initial Impression / Assessment and Plan / ED Course  I have reviewed the triage vital signs and the nursing notes.  Pertinent labs & imaging results that were available during my care of the patient were reviewed by me and considered in my medical decision making (see chart for details).  Clinical Course  Value Comment By Time  EKG 12-Lead NSR Dierdre ForthHannah Mable Lashley, PA-C 09/22 2317  BP: 161/76 VSS Dierdre ForthHannah Amran Malter, PA-C 09/22 2317  DG Chest 2 View No evidence of pneumonia or pulmonary edema. Dierdre ForthHannah Eliceo Gladu, PA-C 09/22 2358   Patient now complaining of chest pain and left arm pain. Suspect this is secondary to the one hour nebulizer however will obtain EKG and troponin. Dahlia ClientHannah Ledarrius Beauchaine, PA-C 09/23 0200   Pt continues to have wheezing, SOB and chest pain.  She will need admission for asthma exacerbation and CP rule out. Pt with ASA allergy.    Dahlia ClientHannah Jeff Frieden, PA-C 09/23 0254  BP: 117/64 (Reviewed) Dierdre ForthHannah Ori Kreiter, PA-C 09/23 0255   Discussed with Dr. Montez Moritaarter who will admit to obs tele Dierdre ForthHannah Avacyn Kloosterman, PA-C 09/23 0311    Pt with Persistent asthma exacerbation. She remains diminished with wheezing. She is not breathing at baseline. No hypoxia. Patient has been given steroids. During a continuous nebulizer patient developed left-sided chest pain that radiated into her left arm. EKG and troponin were within normal limits however she will need chest pain  rule out as well.  Final Clinical Impressions(s) / ED Diagnoses   Final diagnoses:  Asthma exacerbation  Shortness of breath  Chest pain, unspecified chest pain type    New Prescriptions New Prescriptions   No medications on file     Dierdre Forth, PA-C 04/02/16 1610    Pricilla Loveless, MD 04/06/16 1316

## 2016-04-01 NOTE — ED Triage Notes (Signed)
Patient arrives with complaint of wheezing, shortness of breath, chest tightness/burning, and non-productive cough. Onset 5 days ago. Denies fever. Endorses history of Asthma; states "I ran out of my inhaler". Chest currently diminished; auscultation of upper airway with notable wheezes in and out.

## 2016-04-02 ENCOUNTER — Observation Stay (HOSPITAL_BASED_OUTPATIENT_CLINIC_OR_DEPARTMENT_OTHER): Payer: Medicare Other

## 2016-04-02 DIAGNOSIS — I10 Essential (primary) hypertension: Secondary | ICD-10-CM | POA: Diagnosis not present

## 2016-04-02 DIAGNOSIS — J45901 Unspecified asthma with (acute) exacerbation: Secondary | ICD-10-CM | POA: Diagnosis not present

## 2016-04-02 DIAGNOSIS — R079 Chest pain, unspecified: Secondary | ICD-10-CM

## 2016-04-02 DIAGNOSIS — E785 Hyperlipidemia, unspecified: Secondary | ICD-10-CM

## 2016-04-02 LAB — BASIC METABOLIC PANEL
Anion gap: 12 (ref 5–15)
BUN: 15 mg/dL (ref 6–20)
CO2: 22 mmol/L (ref 22–32)
Calcium: 10 mg/dL (ref 8.9–10.3)
Chloride: 108 mmol/L (ref 101–111)
Creatinine, Ser: 0.88 mg/dL (ref 0.44–1.00)
GFR calc Af Amer: >60 mL/min (ref >60)
GFR calc non Af Amer: >60 mL/min (ref >60)
Glucose, Bld: 137 mg/dL — ABNORMAL HIGH (ref 65–99)
Potassium: 3 mmol/L — ABNORMAL LOW (ref 3.5–5.1)
Sodium: 142 mmol/L (ref 135–145)

## 2016-04-02 LAB — ECHOCARDIOGRAM COMPLETE
E decel time: 211 msec
E/e' ratio: 11.94
FS: 38 % (ref 28–44)
Height: 65 in
IVS/LV PW RATIO, ED: 0.82
LA ID, A-P, ES: 44 mm
LA diam end sys: 44 mm
LA diam index: 1.79 cm/m2
LA vol A4C: 69.5 ml
LA vol index: 35.2 mL/m2
LA vol: 86.6 mL
LV E/e' medial: 11.94
LV E/e'average: 11.94
LV PW d: 12.2 mm — AB (ref 0.6–1.1)
LV e' LATERAL: 8.16 cm/s
LVOT area: 4.52 cm2
LVOT diameter: 24 mm
Lateral S' vel: 29 cm/s
MV Dec: 211
MV Peak grad: 4 mmHg
MV pk A vel: 162 m/s
MV pk E vel: 97.4 m/s
TAPSE: 29.5 mm
TDI e' lateral: 8.16
TDI e' medial: 7.94
Weight: 5344 oz

## 2016-04-02 LAB — LIPID PANEL
Cholesterol: 194 mg/dL (ref 0–200)
HDL: 52 mg/dL (ref >40)
LDL Cholesterol: 132 mg/dL — ABNORMAL HIGH (ref 0–99)
Total CHOL/HDL Ratio: 3.7 RATIO
Triglycerides: 50 mg/dL (ref <150)
VLDL: 10 mg/dL (ref 0–40)

## 2016-04-02 LAB — TROPONIN I
Troponin I: 0.06 ng/mL (ref <0.03)
Troponin I: <0.03 ng/mL (ref <0.03)
Troponin I: 0.03 ng/mL (ref <0.03)

## 2016-04-02 LAB — CBC
HCT: 36.3 % (ref 36.0–46.0)
Hemoglobin: 11.1 g/dL — ABNORMAL LOW (ref 12.0–15.0)
MCH: 27.8 pg (ref 26.0–34.0)
MCHC: 30.6 g/dL (ref 30.0–36.0)
MCV: 90.8 fL (ref 78.0–100.0)
Platelets: 280 10*3/uL (ref 150–400)
RBC: 4 MIL/uL (ref 3.87–5.11)
RDW: 14.1 % (ref 11.5–15.5)
WBC: 6.5 10*3/uL (ref 4.0–10.5)

## 2016-04-02 LAB — I-STAT TROPONIN, ED: Troponin i, poc: 0.01 ng/mL (ref 0.00–0.08)

## 2016-04-02 MED ORDER — LISINOPRIL-HYDROCHLOROTHIAZIDE 20-12.5 MG PO TABS: 2.0000 | ORAL_TABLET | Freq: Every day | ORAL | Status: DC

## 2016-04-02 MED ORDER — POTASSIUM CHLORIDE CRYS ER 20 MEQ PO TBCR
40.0000 meq | EXTENDED_RELEASE_TABLET | Freq: Once | ORAL | Status: AC
  Administered 2016-04-02: 40 meq via ORAL
  Filled 2016-04-02: qty 2

## 2016-04-02 MED ORDER — ACETAMINOPHEN 325 MG PO TABS
650.0000 mg | ORAL_TABLET | ORAL | Status: DC | PRN
  Administered 2016-04-02: 650 mg via ORAL
  Filled 2016-04-02: qty 2

## 2016-04-02 MED ORDER — BENZONATATE 100 MG PO CAPS
200.0000 mg | ORAL_CAPSULE | Freq: Two times a day (BID) | ORAL | Status: DC
  Administered 2016-04-02 – 2016-04-04 (×5): 200 mg via ORAL
  Filled 2016-04-02 (×5): qty 2

## 2016-04-02 MED ORDER — ALBUTEROL SULFATE (2.5 MG/3ML) 0.083% IN NEBU: 5.0000 mg | INHALATION_SOLUTION | RESPIRATORY_TRACT | Status: DC | PRN

## 2016-04-02 MED ORDER — HYDROCHLOROTHIAZIDE 25 MG PO TABS
25.0000 mg | ORAL_TABLET | Freq: Every day | ORAL | Status: DC
  Administered 2016-04-02 – 2016-04-04 (×3): 25 mg via ORAL
  Filled 2016-04-02 (×3): qty 1

## 2016-04-02 MED ORDER — NITROGLYCERIN 2 % TD OINT
0.5000 [in_us] | TOPICAL_OINTMENT | Freq: Four times a day (QID) | TRANSDERMAL | Status: DC
  Administered 2016-04-02 – 2016-04-03 (×5): 0.5 [in_us] via TOPICAL
  Filled 2016-04-02: qty 30

## 2016-04-02 MED ORDER — INFLUENZA VAC SPLIT QUAD 0.5 ML IM SUSY
0.5000 mL | PREFILLED_SYRINGE | INTRAMUSCULAR | Status: AC
  Administered 2016-04-04: 0.5 mL via INTRAMUSCULAR

## 2016-04-02 MED ORDER — ALBUTEROL SULFATE (2.5 MG/3ML) 0.083% IN NEBU
2.5000 mg | INHALATION_SOLUTION | Freq: Three times a day (TID) | RESPIRATORY_TRACT | Status: DC
  Administered 2016-04-02 – 2016-04-04 (×6): 2.5 mg via RESPIRATORY_TRACT
  Filled 2016-04-02 (×7): qty 3

## 2016-04-02 MED ORDER — ONDANSETRON HCL 4 MG/2ML IJ SOLN: 4.0000 mg | Freq: Four times a day (QID) | INTRAMUSCULAR | Status: DC | PRN

## 2016-04-02 MED ORDER — MORPHINE SULFATE (PF) 2 MG/ML IV SOLN
1.0000 mg | INTRAVENOUS | Status: DC | PRN
  Administered 2016-04-02 – 2016-04-03 (×2): 1 mg via INTRAVENOUS
  Filled 2016-04-02 (×2): qty 1

## 2016-04-02 MED ORDER — LISINOPRIL 40 MG PO TABS
40.0000 mg | ORAL_TABLET | Freq: Every day | ORAL | Status: DC
  Administered 2016-04-02 – 2016-04-04 (×3): 40 mg via ORAL
  Filled 2016-04-02 (×3): qty 1

## 2016-04-02 MED ORDER — ASPIRIN 81 MG PO CHEW
324.0000 mg | CHEWABLE_TABLET | Freq: Once | ORAL | Status: AC
  Administered 2016-04-02: 324 mg via ORAL
  Filled 2016-04-02: qty 4

## 2016-04-02 MED ORDER — PNEUMOCOCCAL VAC POLYVALENT 25 MCG/0.5ML IJ INJ
0.5000 mL | INJECTION | INTRAMUSCULAR | Status: AC
  Administered 2016-04-04: 0.5 mL via INTRAMUSCULAR
  Filled 2016-04-02 (×2): qty 0.5

## 2016-04-02 MED ORDER — METHYLPREDNISOLONE SODIUM SUCC 125 MG IJ SOLR
60.0000 mg | Freq: Four times a day (QID) | INTRAMUSCULAR | Status: DC
  Administered 2016-04-02 – 2016-04-03 (×6): 60 mg via INTRAVENOUS
  Filled 2016-04-02 (×6): qty 2

## 2016-04-02 MED ORDER — ALBUTEROL SULFATE (2.5 MG/3ML) 0.083% IN NEBU: 2.5000 mg | INHALATION_SOLUTION | RESPIRATORY_TRACT | Status: DC | PRN

## 2016-04-02 MED ORDER — SODIUM CHLORIDE 0.9 % IV SOLN
INTRAVENOUS | Status: DC
  Administered 2016-04-02: 05:00:00 via INTRAVENOUS

## 2016-04-02 MED ORDER — ALBUTEROL SULFATE (2.5 MG/3ML) 0.083% IN NEBU
2.5000 mg | INHALATION_SOLUTION | RESPIRATORY_TRACT | Status: DC
  Administered 2016-04-02: 2.5 mg via RESPIRATORY_TRACT
  Filled 2016-04-02: qty 3

## 2016-04-02 MED ORDER — MORPHINE SULFATE (PF) 2 MG/ML IV SOLN: 1.0000 mg | Freq: Once | INTRAVENOUS | Status: DC

## 2016-04-02 MED ORDER — ENOXAPARIN SODIUM 40 MG/0.4ML ~~LOC~~ SOLN
40.0000 mg | SUBCUTANEOUS | Status: DC
  Administered 2016-04-02 – 2016-04-04 (×3): 40 mg via SUBCUTANEOUS
  Filled 2016-04-02 (×2): qty 0.4

## 2016-04-02 MED ORDER — HYDROCODONE-ACETAMINOPHEN 5-325 MG PO TABS
1.0000 | ORAL_TABLET | Freq: Four times a day (QID) | ORAL | Status: DC | PRN
  Administered 2016-04-02 – 2016-04-03 (×3): 1 via ORAL
  Filled 2016-04-02 (×3): qty 1

## 2016-04-02 MED ORDER — ASPIRIN 325 MG PO TABS
325.0000 mg | ORAL_TABLET | Freq: Every day | ORAL | Status: DC
  Administered 2016-04-03 – 2016-04-04 (×2): 325 mg via ORAL
  Filled 2016-04-02 (×2): qty 1

## 2016-04-02 NOTE — ED Notes (Signed)
Pt reported to RN that she begin having left side chest pain radiating into left arm; pt states she does not feel right; EKG done and given to Dr.Goldston and PA notified; New orders placed

## 2016-04-02 NOTE — Progress Notes (Signed)
  Echocardiogram 2D Echocardiogram has been performed.  [name], [name] 04/02/2016, 3:55 PM

## 2016-04-02 NOTE — Progress Notes (Signed)
CRITICAL VALUE ALERT  Critical value received:  Troponin 0.06  Date of notification:  04/02/16  Time of notification:  0650  Critical value read back: Yes  Nurse who received alert:  Pearline CablesVanessa M  MD notified (1st page): M. Burnadette PeterLynch NP  Time of first page:  [phone]  MD notified (2nd page):  Time of second page:  Responding MD:  M. Burnadette PeterLynch NP  Time MD responded:  [phone]

## 2016-04-02 NOTE — ED Notes (Signed)
Admitting at bedside 

## 2016-04-02 NOTE — H&P (Signed)
History and Physical    [name] ZOX:[id number] DOB: [birth year] DOA: 04/01/2016  PCP: Myrlene Broker, MD   Patient coming from: Home  Chief Complaint: Shortness of breath, increased wheezing, left sided chest pain in the ED  HPI: [name] is a 60 y.o. woman with a history of asthma, HTN, HLD, Anemia, and remote tobacco use (quit over 20 years ago) who presents to the ED for evaluation of shortness of breath and increase wheezing.  Symptoms have been progressive over the past 2-3 days.  Symptoms have been refractory to her rescue inhaler.  She is not on maintenance therapy or long-acting inhalers.  She does not have a nebulizer machine.  She reports that her asthma is typically well controlled; most often triggered by changes in the weather.  She denies any sick contacts, recent travel, or new exposures.  While receiving a one hour nebulizer treatment in the ED, she developed left sided chest pain radiating into her left arm and associated with light-headedness.  No nausea, vomiting, or diaphoresis.  She has had a dry cough.  ED Course: She has not been hypoxic.  She has a sinus tachycardia.  EKG does not show acute changes.  First troponin negative.  Chest xray negative for acute process.  Potassium level 3.  She has received oral prednisone and empiric magnesium.  Hospitalist asked to admit.  Review of Systems: As per HPI otherwise 10 point review of systems negative.    Past Medical History:  Diagnosis Date  . Abnormal EKG 06/2003   History of inverted T waves V1-V3. Normal 2D echo (07/23/2003): LVEF 65%.  . Anemia    BL Hgb 11-12. Ferritin 14 - low normal (08/2007). Colonoscopy 2009 - external hemorrhoids (excellent prep). Last anemia panel (12/2010) - Iron  24, TIBC 269,  B12  316, Folate 11.9, Ferritin 73.  . Asthma   . Degenerative joint disease    BL knees (L>R), lumbar spine. Followed by Sports Medicine, Dr. Jennette Kettle.  . History of multiple pulmonary nodules    Incidental finding: CT Abd/ Pelvis (04/2010) - Several small lower lobe lung nodules, including one pure ground-glass pulmonary nodule measuring 8 mm in the left lower lobe.  Recommend follow-up chest CT (IV contrast preferred) in 6 months to document stability. //  CT Abd/ Pelvis (07/2010) -  3 mm RLL and  8 mm LLL nodule stable.  Other nodules unchanged, likely benign.  . Hyperlipidemia   . Hypertension   . Incarcerated ventral hernia 04/2010   Noted on CT Abd/ pelvis (04/2010). Patient now s/p ventral hernia repair by Dr. Gerrit Friends (12/2010)  . 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 Lower extremity edema    Chronic. 2D echo (2005) - EF 65%.  . Obesity   . Seasonal allergies   . Wears dentures   . Wears glasses     Past Surgical History:  Procedure Laterality Date  . FOOT SURGERY  2004    left  . INCISION AND DRAINAGE ABSCESS ANAL  07/2008   I&D and debridgement of anorectal abscess  . INCISIONAL HERNIA REPAIR  12/2010   Repair of incarcerated ventral incisional hernia with Ethicon mesh patch - performed by Dr. Gerrit Friends.   . INGUINAL HERNIA REPAIR    . JOINT REPLACEMENT Left 2013   left knee  . TOTAL KNEE ARTHROPLASTY  06/16/2011   Left TOTAL KNEE ARTHROPLASTY;  Surgeon: Kennieth Rad;  Location: [facility] OR;  Service: Orthopedics;  Laterality: Left;  left total knee arthroplasty  . TUBAL LIGATION       reports that she quit smoking about 17 years ago. Her smoking use included Cigarettes. She has a 10.00 pack-year smoking history. She has never used smokeless tobacco. She reports that she does not drink alcohol or use drugs.  She is married.  Allergies  Allergen Reactions  . Ketorolac Tromethamine Shortness Of Breath and Palpitations  . Atrovent [Ipratropium] Hives and Rash  . Latex Other (See Comments)    Powdered. Confirm type of reaction with patient.  Patient tells me that she has tolerated other anti-inflammatory agents, including ibuprofen and  naprosyn in the past.  Family History  Problem Relation Age of Onset  . Diabetes Mother   . Hypertension Mother   . Stroke Mother   . Dementia Father   . Heart disease Father   . Hypertension Sister   . Diabetes Brother   . Hypertension Brother   . Rashes / Skin problems Maternal Grandfather   . Heart attack Neg Hx      Prior to Admission medications   Medication Sig Start Date End Date Taking? Authorizing Provider  HYDROcodone-acetaminophen (NORCO/VICODIN) 5-325 MG tablet Take 1-2 tablets by mouth every 6 (six) hours as needed. 04/24/15  Yes Roxy Horseman, PA-C  lisinopril-hydrochlorothiazide (ZESTORETIC) 20-12.5 MG per tablet Take 2 tablets by mouth daily. 05/13/14 04/02/16 Yes Ejiroghene Wendall Stade, MD  methocarbamol (ROBAXIN) 500 MG tablet Take 1 tablet (500 mg total) by mouth every 8 (eight) hours as needed for muscle spasms. 10/13/14  Yes Lenda Kelp, MD  traMADol (ULTRAM) 50 MG tablet Take 50 mg by mouth every 6 (six) hours as needed for moderate pain.   Yes Historical Provider, MD    Physical Exam: Vitals:   04/02/16 0230 04/02/16 0300 04/02/16 0330 04/02/16 0400  BP: 117/64 123/68 106/60 (!) 100/53  Pulse: 100 104 107 105  Resp: 21 19 20 22   Temp:      TempSrc:      SpO2: 98% 100% 97% 97%      Constitutional: NAD, but appears anxious, uncomfortable Vitals:   04/02/16 0230 04/02/16 0300 04/02/16 0330 04/02/16 0400  BP: 117/64 123/68 106/60 (!) 100/53  Pulse: 100 104 107 105  Resp: 21 19 20 22   Temp:      TempSrc:      SpO2: 98% 100% 97% 97%   Eyes: PERRL, lids and conjunctivae normal ENMT: Mucous membranes are DRY. Posterior pharynx clear of any exudate or lesions. Normal dentition.  Neck: normal appearance, supple Respiratory: Diminished bilaterally with reduced air movement, still has intermittent wheezing.  No crackles. Normal respiratory effort. No accessory muscle use.  Cardiovascular: Tachycardic but regular.  She has a murmur heard best at the LLSB.   She has pretibial edema bilaterally.  2+ pulses bilaterally.    GI: abdomen is soft and compressible.  No distention.  No tenderness.  Bowel sounds are present. Musculoskeletal:  No joint deformity in upper and lower extremities. Good ROM, no contractures. Normal muscle tone.  Skin: no rashes, warm and dry Neurologic: No focal deficits. Psychiatric: Normal judgment and insight. Alert and oriented x 3. Normal mood.     Labs on Admission: I have personally reviewed following labs and imaging studies  CBC:  Recent Labs Lab 04/02/16 0224  WBC 6.5  HGB 11.1*  HCT 36.3  MCV 90.8  PLT 280   Basic Metabolic Panel:  Recent Labs Lab 04/02/16 0224  NA 142  K 3.0*  CL 108  CO2 22  GLUCOSE 137*  BUN 15  CREATININE 0.88  CALCIUM 10.0   GFR: CrCl cannot be calculated (Unknown ideal weight.).  Urine analysis:    Component Value Date/Time   COLORURINE YELLOW 09/10/2013 1850   APPEARANCEUR TURBID (A) 09/10/2013 1850   LABSPEC 1.025 05/14/2015 2030   PHURINE 7.0 05/14/2015 2030   GLUCOSEU NEGATIVE 05/14/2015 2030   GLUCOSEU NEG mg/dL 16/10/960402/11/2007 [id number]   HGBUR LARGE (A) 05/14/2015 2030   HGBUR negative 01/20/2009 1517   BILIRUBINUR NEGATIVE 05/14/2015 2030   BILIRUBINUR neg 09/16/2014 1054   KETONESUR NEGATIVE 05/14/2015 2030   PROTEINUR 100 (A) 05/14/2015 2030   UROBILINOGEN 0.2 05/14/2015 2030   NITRITE NEGATIVE 05/14/2015 2030   LEUKOCYTESUR SMALL (A) 05/14/2015 2030   Radiological Exams on Admission: Dg Chest 2 View  Result Date: 04/01/2016 CLINICAL DATA:  Cough, fever, shortness of breath for 1 week. History of hypertension. Nonsmoker. EXAM: CHEST  2 VIEW COMPARISON:  08/23/2014 FINDINGS: The heart size and mediastinal contours are within normal limits. Both lungs are clear. The visualized skeletal structures are unremarkable. IMPRESSION: No active cardiopulmonary disease. Electronically Signed   By: Burman NievesWilliam  Stevens M.D.   On: 04/01/2016 23:40    EKG: Independently  reviewed. Sinus tachycardia.  No acute ST segment elevation.  Assessment/Plan Principal Problem:   Asthma exacerbation Active Problems:   Essential hypertension   Chest pain   Hyperlipidemia      Asthma exacerbation --IV solumedrol q6h --Albuterol nebs q4h --Oxygen as needed  Chest pain --Telemetry monitoring --Serial troponin --ASA 324mg  one time now, nitropaste 1/2 inch q6h, morphine 1mg  IV one time now --Echo in the AM --Will not be a candidate for stress test until respiratory status has improved. --Check A1c and lipid panel to screen for modifiable cardiac risk factors  HTN --Continue lisinopril HCTZ with hold parameters  Hypokalemai --Potassium replacement ordered.   DVT prophylaxis: Lovenox Code Status: FULL Family Communication: Patient's husband present at bedside at time of admission in the ED. Disposition Plan: To be determined. Consults called: NONE Admission status: Place in observation; telemetry   TIME SPENT: 55 minutes   Jerene Bearsarter,Deborh Pense Harrison MD [facility] Pager [phone]  If 7PM-7AM, please contact night-coverage [email or web address] Password [email or web address]  04/02/2016, 4:02 AM

## 2016-04-02 NOTE — ED Notes (Signed)
PA at bedside.

## 2016-04-02 NOTE — ED Notes (Signed)
RN called report to RN on 3W

## 2016-04-02 NOTE — Consult Note (Signed)
Cardiology Consult    Patient ID: PEYTIN DECHERT MRN: [id number], DOB/AGE: 1956-06-15   Admit date: 04/01/2016 Date of Consult: 04/02/2016  Primary Physician: Myrlene Broker, MD Reason for Consult: Chest Pain Primary Cardiologist: Seen by Dr. Myrtis Ser in 2005 - Now Dr. Mayford Knife Requesting Provider: Dr. Waymon Amato   History of Present Illness    [name] is a 60 y.o. female with past medical history of HTN, HLD, asthma, and degenerative joint disease who presented to [facility] ED on 04/01/2016 for evaluation of chest pain.  She reports having episodes of left-sided chest pain and left neck pain for the past 2-3 months. Says her symptoms can occur with rest or with exertion and can last for days at a time. Her neck pain is exacerbated by movement. No associated dyspnea. Does report occasional nausea and vomiting. She has not taken any medications for the pain. Has tried a warm compress without any relief.   Says she has asthma and uses Albuterol as needed but ran out of this a few weeks ago. Does not exercise regularly secondary to knee pain. Had a left knee replacement in 2012 and has plans to undergo a right knee replacement in the future following weight loss.   No history of Type 2 DM. Has history of HTN and HLD. Denies any recent tobacco use. Has a 10 pack year history but quit smoking in 1997. Family history of CAD in her father who had an MI in his 41's.   Labs show a WBC of 6.5, Hgb 11.1, and platelets 280. K+ 3.0. Creatinine 0.88. Initial troponin values at 0.06 and 0.03. CXR with no active cardiopulmonary disease. EKG shows NSR, HR 75, and no acute ST or T-wave changes.   Last echocardiogram in 07/2003 showed preserved EF of 65% with no wall motion abnormalities and no valve abnormalities.    Past Medical History   Past Medical History:  Diagnosis Date  . Abnormal EKG 06/2003   History of inverted T waves V1-V3. Normal 2D echo (07/23/2003): LVEF 65%.  . Anemia    BL  Hgb 11-12. Ferritin 14 - low normal (08/2007). Colonoscopy 2009 - external hemorrhoids (excellent prep). Last anemia panel (12/2010) - Iron  24, TIBC 269,  B12  316, Folate 11.9, Ferritin 73.  . Asthma   . Degenerative joint disease    BL knees (L>R), lumbar spine. Followed by Sports Medicine, Dr. Jennette Kettle.  . History of multiple pulmonary nodules    Incidental finding: CT Abd/ Pelvis (04/2010) - Several small lower lobe lung nodules, including one pure ground-glass pulmonary nodule measuring 8 mm in the left lower lobe.  Recommend follow-up chest CT (IV contrast preferred) in 6 months to document stability. //  CT Abd/ Pelvis (07/2010) -  3 mm RLL and  8 mm LLL nodule stable.  Other nodules unchanged, likely benign.  . Hyperlipidemia   . Hypertension   . Incarcerated ventral hernia 04/2010   Noted on CT Abd/ pelvis (04/2010). Patient now s/p ventral hernia repair by Dr. Gerrit Friends (12/2010)  . 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 Lower extremity edema    Chronic. 2D echo (2005) - EF 65%.  . Obesity   . Seasonal allergies   . Wears dentures   . Wears glasses     Past Surgical History:  Procedure Laterality Date  . FOOT SURGERY  2004    left  . INCISION AND DRAINAGE ABSCESS ANAL  07/2008  I&D and debridgement of anorectal abscess  . INCISIONAL HERNIA REPAIR  12/2010   Repair of incarcerated ventral incisional hernia with Ethicon mesh patch - performed by Dr. Gerrit FriendsGerkin.   . INGUINAL HERNIA REPAIR    . JOINT REPLACEMENT Left 2013   left knee  . TOTAL KNEE ARTHROPLASTY  06/16/2011   Left TOTAL KNEE ARTHROPLASTY;  Surgeon: Kennieth RadArthur F Carter;  Location: [facility] OR;  Service: Orthopedics;  Laterality: Left;  left total knee arthroplasty  . TUBAL LIGATION       Allergies  Allergies  Allergen Reactions  . Ketorolac Tromethamine Shortness Of Breath and Palpitations  . Atrovent [Ipratropium] Hives and Rash  . Latex Other (See Comments)    Powdered. Confirm type of reaction  with patient.    Inpatient Medications    . albuterol  2.5 mg Nebulization TID  . enoxaparin (LOVENOX) injection  40 mg Subcutaneous Q24H  . lisinopril  40 mg Oral Daily   And  . hydrochlorothiazide  25 mg Oral Daily  . methylPREDNISolone (SOLU-MEDROL) injection  60 mg Intravenous Q6H  .  morphine injection  1 mg Intravenous Once  . nitroGLYCERIN  0.5 inch Topical Q6H    Family History    Family History  Problem Relation Age of Onset  . Diabetes Mother   . Hypertension Mother   . Stroke Mother   . Dementia Father   . Heart disease Father   . Hypertension Sister   . Diabetes Brother   . Hypertension Brother   . Rashes / Skin problems Maternal Grandfather   . Heart attack Neg Hx     Social History    Social History   Social History  . Marital status: Married    Spouse name: N/A  . Number of children: N/A  . Years of education: N/A   Occupational History  . Not on file.   Social History Main Topics  . Smoking status: Former Smoker    Packs/day: 0.50    Years: 20.00    Types: Cigarettes    Quit date: 09/10/1998  . Smokeless tobacco: Never Used  . Alcohol use No  . Drug use: No  . Sexual activity: Yes    Partners: Male   Other Topics Concern  . Not on file   Social History Narrative  . No narrative on file     Review of Systems    General:  No chills, fever, night sweats or weight changes. Positive for generalized fatigue and joint pain. Cardiovascular:  No dyspnea on exertion, edema, orthopnea, palpitations, paroxysmal nocturnal dyspnea. Positive for chest pain and neck pain.  Dermatological: No rash, lesions/masses Respiratory: No cough, dyspnea Urologic: No hematuria, dysuria Abdominal:   No nausea, vomiting, diarrhea, bright red blood per rectum, melena, or hematemesis Neurologic:  No visual changes, wkns, changes in mental status. All other systems reviewed and are otherwise negative except as noted above.  Physical Exam    Blood pressure (!)  141/65, pulse (!) 107, temperature 98.4 F (36.9 C), temperature source Oral, resp. rate 19, height 5\' 5"  (1.651 m), weight (!) 334 lb (151.5 kg), last menstrual period 09/09/2009, SpO2 98 %.  General: Pleasant, obese African American female appearing in NAD Psych: Normal affect. Neuro: Alert and oriented X 3. Moves all extremities spontaneously. HEENT: Normal  Neck: Supple without bruits or JVD. Lungs:  Resp regular and unlabored, CTA without wheezing or rales. Heart: RRR no s3, s4, or murmurs. Tender to palpation along left pectoral region.  Abdomen: Soft,  non-tender, non-distended, BS + x 4.  Extremities: No clubbing, cyanosis or edema. DP/PT/Radials 2+ and equal bilaterally.  Labs    Troponin [facility] of Care Test)  Recent Labs  04/02/16 0132  TROPIPOC 0.01    Recent Labs  04/02/16 0531 04/02/16 0737  TROPONINI 0.06* <0.03   Lab Results  Component Value Date   WBC 6.5 04/02/2016   HGB 11.1 (L) 04/02/2016   HCT 36.3 04/02/2016   MCV 90.8 04/02/2016   PLT 280 04/02/2016    Recent Labs Lab 04/02/16 0224  NA 142  K 3.0*  CL 108  CO2 22  BUN 15  CREATININE 0.88  CALCIUM 10.0  GLUCOSE 137*   Lab Results  Component Value Date   CHOL 194 04/02/2016   HDL 52 04/02/2016   LDLCALC 132 (H) 04/02/2016   TRIG 50 04/02/2016   Lab Results  Component Value Date   DDIMER 0.83 (H) 06/24/2014     Radiology Studies    Dg Chest 2 View  Result Date: 04/01/2016 CLINICAL DATA:  Cough, fever, shortness of breath for 1 week. History of hypertension. Nonsmoker. EXAM: CHEST  2 VIEW COMPARISON:  08/23/2014 FINDINGS: The heart size and mediastinal contours are within normal limits. Both lungs are clear. The visualized skeletal structures are unremarkable. IMPRESSION: No active cardiopulmonary disease. Electronically Signed   By: Burman Nieves M.D.   On: 04/01/2016 23:40    EKG & Cardiac Imaging    EKG: NSR, HR 75, and no acute ST or T-wave changes.   Echocardiogram:  Pending  Assessment & Plan    1. Atypical Chest Pain  - presented with left-sided chest and neck pain for the past 2 months. Can last for 2-3 days at a time. No association with rest or exertion. -  pain along chest is reproducible with palpation. Neck pain worse with movements.  -  Initial troponin values at 0.06 and 0.03. EKG shows NSR, HR 75, and no acute ST or T-wave changes.  - overall, pain is atypical in that it is worse with particular body movements and worse with palpation, concerning for a MSK etiology.  - with multiple risk factors, will check echocardiogram to assess LV function and wall motion. If EF significantly reduced, would require further ischemic evaluation with a cardiac catheterization.   2. HLD - Lipid Panel with LDL of 132.  - Not currently on statin therapy. Followed by PCP.  3. HTN - BP well-controlled. Continue current medication regimen.   Signed, Ellsworth Lennox, PA-C 04/02/2016, 10:52 AM Pager: [phone]

## 2016-04-02 NOTE — Progress Notes (Addendum)
PROGRESS NOTE  [name]  ZOX:[id number] DOB: 1955-12-24  DOA: 04/01/2016 PCP: Myrlene Broker, MD   Brief Narrative:  60 year old female with a PMH of asthma (infrequent exacerbations), HTN, HLD, former smoker, exposed to passive smoking from spouse, morbid obesity, history of MI in father at age 68 years, presented to ED with complaints of worsening dyspnea, wheezing and several months history of mid sternal burning chest discomfort, radiating to left side of neck and left upper extremity concerning for unstable angina. Admitted for asthma exacerbation and chest pain evaluation. Cardiology consulted 9/23.   Assessment & Plan:   Principal Problem:   Asthma exacerbation Active Problems:   Essential hypertension   Chest pain   Hyperlipidemia   Pain in the chest   Asthma exacerbation - Reports infrequent exacerbations and the last episode was in January 2017. Reports that she ran out of her albuterol inhaler and has not been using anything since current symptoms began. Former smoker. - Treating with IV Solu-Medrol, oxygen, bronchodilator nebulizations - Monitor. Recommended outpatient PFTs after complete resolution of acute exacerbation to differentiate between asthma versus COPD.  Recurrent chest pain/? Unstable angina - She gives several months history of midsternal burning type of chest pain, occurs both at rest and with activity, radiates to left side of neck and left upper extremity up to mid forearm, uses hot compresses to the neck, symptoms may sometimes last for a couple days before resolution. She has been having these up to every other day but has not sought medical attention. No history of recent travel. Symptoms not similar to her GERD. - Heart score: 6 - Concerning for unstable angina. Mildly elevated troponin, 0.06 - Cardiology consulted and will need some form of ischemic evaluation, possibly after pulmonary status stabilizes. - - - Continue aspirin 325 MG daily,  NTP, when necessary IV morphine, check echo due to loud murmur.  - Defer IV heparin per cardiology. Discussed with cardiology team.  Essential hypertension - Continue lisinopril/HCTZ. Controlled.  Hyperlipidemia  - Not on medications PTA. LDL 132. Consider statins.   Hypokalemia - Replaced. Follow BMP in a.m.  Anemia - Follow CBC in a.m.   Morbid obesity/Body mass index is 55.58 kg/m. - Recommend diet, exercise and weight loss when able.    DVT prophylaxis: Lovenox  Code Status: Full  Family Communication: none at bedside  Disposition Plan: DC home when medically improved.    Consultants:   Cardiology  Procedures:   None  Antimicrobials:   None    Subjective: States that she does not feel significantly different than on admission. Still having dyspnea and intermittent wheezing. Intermittent mild midsternal burning chest discomfort  Objective:  Vitals:   04/02/16 0330 04/02/16 0400 04/02/16 0430 04/02/16 1025  BP: 106/60 (!) 100/53 (!) 141/65   Pulse: 107 105 (!) 107   Resp: 20 22 19    Temp:   98.4 F (36.9 C)   TempSrc:   Oral   SpO2: 97% 97% 98% 98%  Weight:   (!) 151.5 kg (334 lb)   Height:   5\' 5"  (1.651 m)     Intake/Output Summary (Last 24 hours) at 04/02/16 1239 Last data filed at 04/02/16 8119  Gross per 24 hour  Intake            78.75 ml  Output                0 ml  Net            78.75  ml   Filed Weights   04/02/16 0430  Weight: (!) 151.5 kg (334 lb)    Examination:  General exam: Pleasant middle-aged female, moderately built and morbidly obese, lying comfortably propped up in bed.  Respiratory system: slightly diminished breath sounds bilaterally with occasional expiratory rhonchi and no crackles. Respiratory effort normal. Cardiovascular system: S1 & S2 heard, RRR. No JVD, murmurs, rubs, gallops or clicks. No pedal edema. Telemetry: Sinus tachycardia in the low 100s.  Gastrointestinal system: Abdomen is nondistended, soft and  nontender. No organomegaly or masses felt. Normal bowel sounds heard. Central nervous system: Alert and oriented. No focal neurological deficits. Extremities: Symmetric 5 x 5 power. Skin: No rashes, lesions or ulcers Psychiatry: Judgement and insight appear normal. Mood & affect appropriate.     Data Reviewed: I have personally reviewed following labs and imaging studies  CBC:  Recent Labs Lab 04/02/16 0224  WBC 6.5  HGB 11.1*  HCT 36.3  MCV 90.8  PLT 280   Basic Metabolic Panel:  Recent Labs Lab 04/02/16 0224  NA 142  K 3.0*  CL 108  CO2 22  GLUCOSE 137*  BUN 15  CREATININE 0.88  CALCIUM 10.0   GFR: Estimated Creatinine Clearance: 103 mL/min (by C-G formula based on SCr of 0.88 mg/dL). Liver Function Tests: No results for input(s): AST, ALT, ALKPHOS, BILITOT, PROT, ALBUMIN in the last 168 hours. No results for input(s): LIPASE, AMYLASE in the last 168 hours. No results for input(s): AMMONIA in the last 168 hours. Coagulation Profile: No results for input(s): INR, PROTIME in the last 168 hours. Cardiac Enzymes:  Recent Labs Lab 04/02/16 0531 04/02/16 0737 04/02/16 1047  TROPONINI 0.06* <0.03 0.03*   BNP (last 3 results) No results for input(s): PROBNP in the last 8760 hours. HbA1C: No results for input(s): HGBA1C in the last 72 hours. CBG: No results for input(s): GLUCAP in the last 168 hours. Lipid Profile:  Recent Labs  04/02/16 0531  CHOL 194  HDL 52  LDLCALC 132*  TRIG 50  CHOLHDL 3.7   Thyroid Function Tests: No results for input(s): TSH, T4TOTAL, FREET4, T3FREE, THYROIDAB in the last 72 hours. Anemia Panel: No results for input(s): VITAMINB12, FOLATE, FERRITIN, TIBC, IRON, RETICCTPCT in the last 72 hours.  Sepsis Labs: No results for input(s): PROCALCITON, LATICACIDVEN in the last 168 hours.  No results found for this or any previous visit (from the past 240 hour(s)).       Radiology Studies: Dg Chest 2 View  Result Date:  04/01/2016 CLINICAL DATA:  Cough, fever, shortness of breath for 1 week. History of hypertension. Nonsmoker. EXAM: CHEST  2 VIEW COMPARISON:  08/23/2014 FINDINGS: The heart size and mediastinal contours are within normal limits. Both lungs are clear. The visualized skeletal structures are unremarkable. IMPRESSION: No active cardiopulmonary disease. Electronically Signed   By: Burman Nieves M.D.   On: 04/01/2016 23:40        Scheduled Meds: . albuterol  2.5 mg Nebulization TID  . enoxaparin (LOVENOX) injection  40 mg Subcutaneous Q24H  . lisinopril  40 mg Oral Daily   And  . hydrochlorothiazide  25 mg Oral Daily  . methylPREDNISolone (SOLU-MEDROL) injection  60 mg Intravenous Q6H  .  morphine injection  1 mg Intravenous Once  . nitroGLYCERIN  0.5 inch Topical Q6H   Continuous Infusions: . sodium chloride 75 mL/hr at 04/02/16 0517     LOS: 0 days    Time spent: 45 minutes.    [facility], MD [facility] Pager [phone]  If 7PM-7AM, please contact night-coverage [email or web address] Password [email or web address] 04/02/2016, 12:39 PM

## 2016-04-03 DIAGNOSIS — I10 Essential (primary) hypertension: Secondary | ICD-10-CM | POA: Diagnosis not present

## 2016-04-03 DIAGNOSIS — M542 Cervicalgia: Secondary | ICD-10-CM | POA: Diagnosis present

## 2016-04-03 DIAGNOSIS — R911 Solitary pulmonary nodule: Secondary | ICD-10-CM | POA: Diagnosis present

## 2016-04-03 DIAGNOSIS — Z886 Allergy status to analgesic agent status: Secondary | ICD-10-CM | POA: Diagnosis not present

## 2016-04-03 DIAGNOSIS — D72829 Elevated white blood cell count, unspecified: Secondary | ICD-10-CM | POA: Diagnosis not present

## 2016-04-03 DIAGNOSIS — R0602 Shortness of breath: Secondary | ICD-10-CM | POA: Diagnosis present

## 2016-04-03 DIAGNOSIS — Z23 Encounter for immunization: Secondary | ICD-10-CM | POA: Diagnosis not present

## 2016-04-03 DIAGNOSIS — Z91040 Latex allergy status: Secondary | ICD-10-CM | POA: Diagnosis not present

## 2016-04-03 DIAGNOSIS — Z8249 Family history of ischemic heart disease and other diseases of the circulatory system: Secondary | ICD-10-CM | POA: Diagnosis not present

## 2016-04-03 DIAGNOSIS — Z6841 Body Mass Index (BMI) 40.0 and over, adult: Secondary | ICD-10-CM | POA: Diagnosis not present

## 2016-04-03 DIAGNOSIS — E876 Hypokalemia: Secondary | ICD-10-CM | POA: Diagnosis present

## 2016-04-03 DIAGNOSIS — D649 Anemia, unspecified: Secondary | ICD-10-CM | POA: Diagnosis present

## 2016-04-03 DIAGNOSIS — R079 Chest pain, unspecified: Secondary | ICD-10-CM | POA: Diagnosis not present

## 2016-04-03 DIAGNOSIS — E785 Hyperlipidemia, unspecified: Secondary | ICD-10-CM | POA: Diagnosis not present

## 2016-04-03 DIAGNOSIS — J45901 Unspecified asthma with (acute) exacerbation: Secondary | ICD-10-CM | POA: Diagnosis not present

## 2016-04-03 DIAGNOSIS — Z96652 Presence of left artificial knee joint: Secondary | ICD-10-CM | POA: Diagnosis present

## 2016-04-03 DIAGNOSIS — R0789 Other chest pain: Secondary | ICD-10-CM | POA: Diagnosis present

## 2016-04-03 DIAGNOSIS — T380X5A Adverse effect of glucocorticoids and synthetic analogues, initial encounter: Secondary | ICD-10-CM | POA: Diagnosis not present

## 2016-04-03 DIAGNOSIS — Z7722 Contact with and (suspected) exposure to environmental tobacco smoke (acute) (chronic): Secondary | ICD-10-CM | POA: Diagnosis present

## 2016-04-03 DIAGNOSIS — Z888 Allergy status to other drugs, medicaments and biological substances status: Secondary | ICD-10-CM | POA: Diagnosis not present

## 2016-04-03 DIAGNOSIS — Z79899 Other long term (current) drug therapy: Secondary | ICD-10-CM | POA: Diagnosis not present

## 2016-04-03 DIAGNOSIS — Z87891 Personal history of nicotine dependence: Secondary | ICD-10-CM | POA: Diagnosis not present

## 2016-04-03 LAB — CBC
HCT: 35.2 % — ABNORMAL LOW (ref 36.0–46.0)
Hemoglobin: 10.8 g/dL — ABNORMAL LOW (ref 12.0–15.0)
MCH: 27.8 pg (ref 26.0–34.0)
MCHC: 30.7 g/dL (ref 30.0–36.0)
MCV: 90.5 fL (ref 78.0–100.0)
Platelets: 315 10*3/uL (ref 150–400)
RBC: 3.89 MIL/uL (ref 3.87–5.11)
RDW: 14.5 % (ref 11.5–15.5)
WBC: 15.8 10*3/uL — ABNORMAL HIGH (ref 4.0–10.5)

## 2016-04-03 LAB — BASIC METABOLIC PANEL
Anion gap: 9 (ref 5–15)
BUN: 17 mg/dL (ref 6–20)
CO2: 23 mmol/L (ref 22–32)
Calcium: 10.2 mg/dL (ref 8.9–10.3)
Chloride: 107 mmol/L (ref 101–111)
Creatinine, Ser: 0.86 mg/dL (ref 0.44–1.00)
GFR calc Af Amer: >60 mL/min (ref >60)
GFR calc non Af Amer: >60 mL/min (ref >60)
Glucose, Bld: 135 mg/dL — ABNORMAL HIGH (ref 65–99)
Potassium: 4.4 mmol/L (ref 3.5–5.1)
Sodium: 139 mmol/L (ref 135–145)

## 2016-04-03 LAB — HEMOGLOBIN A1C
Hgb A1c MFr Bld: 5.7 % — ABNORMAL HIGH (ref 4.8–5.6)
Mean Plasma Glucose: 117 mg/dL

## 2016-04-03 LAB — MAGNESIUM: Magnesium: 2.6 mg/dL — ABNORMAL HIGH (ref 1.7–2.4)

## 2016-04-03 MED ORDER — METOCLOPRAMIDE HCL 5 MG/ML IJ SOLN
10.0000 mg | Freq: Once | INTRAMUSCULAR | Status: AC
  Administered 2016-04-03: 10 mg via INTRAVENOUS
  Filled 2016-04-03: qty 2

## 2016-04-03 MED ORDER — METHYLPREDNISOLONE SODIUM SUCC 40 MG IJ SOLR
40.0000 mg | Freq: Two times a day (BID) | INTRAMUSCULAR | Status: DC
  Administered 2016-04-03: 40 mg via INTRAVENOUS
  Filled 2016-04-03: qty 1

## 2016-04-03 MED ORDER — ACETAMINOPHEN 325 MG PO TABS
650.0000 mg | ORAL_TABLET | ORAL | Status: DC | PRN
  Administered 2016-04-04: 650 mg via ORAL
  Filled 2016-04-03: qty 2

## 2016-04-03 MED ORDER — DIPHENHYDRAMINE HCL 50 MG/ML IJ SOLN
25.0000 mg | Freq: Once | INTRAMUSCULAR | Status: AC
  Administered 2016-04-03: 25 mg via INTRAVENOUS
  Filled 2016-04-03: qty 1

## 2016-04-03 NOTE — Progress Notes (Signed)
PROGRESS NOTE  [name]  ZOX:[id number] DOB: 1956/07/06  DOA: 04/01/2016 PCP: Myrlene Broker, MD   Brief Narrative:  60 year old female with a PMH of asthma (infrequent exacerbations), HTN, HLD, former smoker, exposed to passive smoking from spouse, morbid obesity, history of MI in father at age 93 years, presented to ED with complaints of worsening dyspnea, wheezing and several months history of mid sternal burning chest discomfort, radiating to left side of neck and left upper extremity concerning for unstable angina. Admitted for asthma exacerbation and chest pain evaluation. Cardiology consulted 9/23.   Assessment & Plan:   Principal Problem:   Asthma exacerbation Active Problems:   Essential hypertension   Chest pain   Hyperlipidemia   Pain in the chest   Asthma exacerbation - Reports infrequent exacerbations and the last episode was in January 2017. Reports that she ran out of her albuterol inhaler and has not been using anything since current symptoms began. Former smoker. - Treating with IV Solu-Medrol, oxygen, bronchodilator nebulizations - Monitor. Recommended outpatient PFTs after complete resolution of acute exacerbation to differentiate between asthma versus COPD. - Improving. We'll reduce IV Solu-Medrol to 40 mg every 12 hours. Transitioned to oral prednisone taper in a.m. at discharge.  Recurrent chest pain - Initially there was concern for cardiac etiology for her chest pain. Cardiology was consulted. They indicate that her chest pain is atypical consistent with musculoskeletal etiology. Echo with normal LVEF. No further inpatient workup and cardiology will arrange for outpatient nuclear stress test and have cleared her for discharge from cardiac standpoint. - May consider outpatient MRI of C-spine to be arranged via her PCP for further evaluation of her neck pain. Discussed with patient verbalizes understanding.  Essential hypertension - Continue  lisinopril/HCTZ. Controlled.  Hyperlipidemia  - Not on medications PTA. LDL 132. Consider statins.   Hypokalemia - Replaced.   Anemia - Stable. Outpatient follow-up.  Morbid obesity/Body mass index is 55.58 kg/m. - Recommend diet, exercise and weight loss when able.    DVT prophylaxis: Lovenox  Code Status: Full  Family Communication: none at bedside  Disposition Plan: DC home when medically improved. DC home possibly 9/25.   Consultants:   Cardiology  Procedures:   2-D echo 04/02/16: Study Conclusions  - Left ventricle: The cavity size was normal. Systolic function was   vigorous. The estimated ejection fraction was in the range of 65%   to 70%. Wall motion was normal; there were no regional wall   motion abnormalities. There was an increased relative   contribution of atrial contraction to ventricular filling.   Doppler parameters are consistent with abnormal left ventricular   relaxation (grade 1 diastolic dysfunction). - Aortic valve: Trileaflet; mildly thickened, mildly calcified   leaflets. - Mitral valve: Calcified annulus. - Left atrium: The atrium was mildly dilated.  Antimicrobials:   None    Subjective: Dyspnea has significantly improved. Intermittent mild chest pain. None when I saw her.  Objective:  Vitals:   04/02/16 2100 04/03/16 0500 04/03/16 0905 04/03/16 0923  BP: (!) 129/55 (!) 149/77  140/61  Pulse: 78 76    Resp: 18 18    Temp: 97.9 F (36.6 C) 98.3 F (36.8 C)    TempSrc: Oral Oral    SpO2: 94% 94% 100%   Weight:      Height:        Intake/Output Summary (Last 24 hours) at 04/03/16 1254 Last data filed at 04/03/16 0929  Gross per 24 hour  Intake  740 ml  Output                0 ml  Net              740 ml   Filed Weights   04/02/16 0430  Weight: (!) 151.5 kg (334 lb)    Examination:  General exam: Pleasant middle-aged female, moderately built and morbidly obese,Seen ambulating comfortably in her  room. Respiratory system: Clear to auscultation. Respiratory effort normal. Reproducible left upper anterior chest/just below the clavicle chest pain. Cardiovascular system: S1 & S2 heard, RRR. No JVD, murmurs, rubs, gallops or clicks. No pedal edema. Telemetry: SR-ST in 100's. Gastrointestinal system: Abdomen is nondistended, soft and nontender. No organomegaly or masses felt. Normal bowel sounds heard. Central nervous system: Alert and oriented. No focal neurological deficits. Extremities: Symmetric 5 x 5 power. Skin: No rashes, lesions or ulcers Psychiatry: Judgement and insight appear normal. Mood & affect appropriate.     Data Reviewed: I have personally reviewed following labs and imaging studies  CBC:  Recent Labs Lab 04/02/16 0224 04/03/16 0430  WBC 6.5 15.8*  HGB 11.1* 10.8*  HCT 36.3 35.2*  MCV 90.8 90.5  PLT 280 315   Basic Metabolic Panel:  Recent Labs Lab 04/02/16 0224 04/03/16 0430  NA 142 139  K 3.0* 4.4  CL 108 107  CO2 22 23  GLUCOSE 137* 135*  BUN 15 17  CREATININE 0.88 0.86  CALCIUM 10.0 10.2  MG  --  2.6*   GFR: Estimated Creatinine Clearance: 105.4 mL/min (by C-G formula based on SCr of 0.86 mg/dL). Liver Function Tests: No results for input(s): AST, ALT, ALKPHOS, BILITOT, PROT, ALBUMIN in the last 168 hours. No results for input(s): LIPASE, AMYLASE in the last 168 hours. No results for input(s): AMMONIA in the last 168 hours. Coagulation Profile: No results for input(s): INR, PROTIME in the last 168 hours. Cardiac Enzymes:  Recent Labs Lab 04/02/16 0531 04/02/16 0737 04/02/16 1047  TROPONINI 0.06* <0.03 0.03*   BNP (last 3 results) No results for input(s): PROBNP in the last 8760 hours. HbA1C:  Recent Labs  04/02/16 0531  HGBA1C 5.7*   CBG: No results for input(s): GLUCAP in the last 168 hours. Lipid Profile:  Recent Labs  04/02/16 0531  CHOL 194  HDL 52  LDLCALC 132*  TRIG 50  CHOLHDL 3.7   Thyroid Function  Tests: No results for input(s): TSH, T4TOTAL, FREET4, T3FREE, THYROIDAB in the last 72 hours. Anemia Panel: No results for input(s): VITAMINB12, FOLATE, FERRITIN, TIBC, IRON, RETICCTPCT in the last 72 hours.  Sepsis Labs: No results for input(s): PROCALCITON, LATICACIDVEN in the last 168 hours.  No results found for this or any previous visit (from the past 240 hour(s)).       Radiology Studies: Dg Chest 2 View  Result Date: 04/01/2016 CLINICAL DATA:  Cough, fever, shortness of breath for 1 week. History of hypertension. Nonsmoker. EXAM: CHEST  2 VIEW COMPARISON:  08/23/2014 FINDINGS: The heart size and mediastinal contours are within normal limits. Both lungs are clear. The visualized skeletal structures are unremarkable. IMPRESSION: No active cardiopulmonary disease. Electronically Signed   By: Burman NievesWilliam  Stevens M.D.   On: 04/01/2016 23:40        Scheduled Meds: . albuterol  2.5 mg Nebulization TID  . aspirin  325 mg Oral Daily  . benzonatate  200 mg Oral BID  . enoxaparin (LOVENOX) injection  40 mg Subcutaneous Q24H  . lisinopril  40 mg Oral  Daily   And  . hydrochlorothiazide  25 mg Oral Daily  . Influenza vac split quadrivalent PF  0.5 mL Intramuscular Tomorrow-1000  . methylPREDNISolone (SOLU-MEDROL) injection  40 mg Intravenous Q12H  . pneumococcal 23 valent vaccine  0.5 mL Intramuscular Tomorrow-1000   Continuous Infusions:     LOS: 0 days    HONGALGI,ANAND, MD [facility] Pager [phone]  If 7PM-7AM, please contact night-coverage [email or web address] Password [facility] 04/03/2016, 12:54 PM

## 2016-04-03 NOTE — Progress Notes (Signed)
Patient Name: [name] Date of Encounter: 04/03/2016  Primary Cardiologist: Dr. Lenise Arena Problem List     Principal Problem:   Asthma exacerbation Active Problems:   Essential hypertension   Chest pain   Hyperlipidemia   Pain in the chest     Subjective  Feels well, no chest pain or SOB.   Inpatient Medications    Scheduled Meds: . albuterol  2.5 mg Nebulization TID  . aspirin  325 mg Oral Daily  . benzonatate  200 mg Oral BID  . enoxaparin (LOVENOX) injection  40 mg Subcutaneous Q24H  . lisinopril  40 mg Oral Daily   And  . hydrochlorothiazide  25 mg Oral Daily  . Influenza vac split quadrivalent PF  0.5 mL Intramuscular Tomorrow-1000  . methylPREDNISolone (SOLU-MEDROL) injection  60 mg Intravenous Q6H  .  morphine injection  1 mg Intravenous Once  . nitroGLYCERIN  0.5 inch Topical Q6H  . pneumococcal 23 valent vaccine  0.5 mL Intramuscular Tomorrow-1000   Continuous Infusions:  PRN Meds:.acetaminophen, albuterol, HYDROcodone-acetaminophen, morphine injection, ondansetron (ZOFRAN) IV   Vital Signs    Vitals:   04/02/16 2100 04/03/16 0500 04/03/16 0905 04/03/16 0923  BP: (!) 129/55 (!) 149/77  140/61  Pulse: 78 76    Resp: 18 18    Temp: 97.9 F (36.6 C) 98.3 F (36.8 C)    TempSrc: Oral Oral    SpO2: 94% 94% 100%   Weight:      Height:        Intake/Output Summary (Last 24 hours) at 04/03/16 1007 Last data filed at 04/03/16 0929  Gross per 24 hour  Intake              740 ml  Output                0 ml  Net              740 ml   Filed Weights   04/02/16 0430  Weight: (!) 334 lb (151.5 kg)    Physical Exam   GEN: Well nourished, well developed, in no acute distress.  HEENT: Grossly normal.  Neck: Supple, no JVD, carotid bruits, or masses. Cardiac: RRR, no murmurs, rubs, or gallops. No clubbing, cyanosis, edema.  Radials/DP/PT 2+ and equal bilaterally.  Respiratory:  Respirations regular and unlabored, clear to auscultation  bilaterally. GI: Soft, nontender, nondistended, BS + x 4. MS: no deformity or atrophy. Skin: warm and dry, no rash. Neuro:  Strength and sensation are intact. Psych: AAOx3.  Normal affect.  Labs    CBC  Recent Labs  04/02/16 0224 04/03/16 0430  WBC 6.5 15.8*  HGB 11.1* 10.8*  HCT 36.3 35.2*  MCV 90.8 90.5  PLT 280 315   Basic Metabolic Panel  Recent Labs  04/02/16 0224 04/03/16 0430  NA 142 139  K 3.0* 4.4  CL 108 107  CO2 22 23  GLUCOSE 137* 135*  BUN 15 17  CREATININE 0.88 0.86  CALCIUM 10.0 10.2  MG  --  2.6*   Cardiac Enzymes  Recent Labs  04/02/16 0531 04/02/16 0737 04/02/16 1047  TROPONINI 0.06* <0.03 0.03*   Fasting Lipid Panel  Recent Labs  04/02/16 0531  CHOL 194  HDL 52  LDLCALC 132*  TRIG 50  CHOLHDL 3.7    Telemetry    NSR- Personally Reviewed  ECG    NSR - Personally Reviewed  Radiology    Dg Chest 2 View  Result  Date: 04/01/2016 CLINICAL DATA:  Cough, fever, shortness of breath for 1 week. History of hypertension. Nonsmoker. EXAM: CHEST  2 VIEW COMPARISON:  08/23/2014 FINDINGS: The heart size and mediastinal contours are within normal limits. Both lungs are clear. The visualized skeletal structures are unremarkable. IMPRESSION: No active cardiopulmonary disease. Electronically Signed   By: Burman NievesWilliam  Stevens M.D.   On: 04/01/2016 23:40     Cardiac Studies   Transthoracic Echocardiography 04/02/16 Study Conclusions  - Left ventricle: The cavity size was normal. Systolic function was   vigorous. The estimated ejection fraction was in the range of 65%   to 70%. Wall motion was normal; there were no regional wall   motion abnormalities. There was an increased relative   contribution of atrial contraction to ventricular filling.   Doppler parameters are consistent with abnormal left ventricular   relaxation (grade 1 diastolic dysfunction). - Aortic valve: Trileaflet; mildly thickened, mildly calcified   leaflets. - Mitral  valve: Calcified annulus. - Left atrium: The atrium was mildly dilated.   Patient Profile     [name] is a 60 y.o. female with past medical history of HTN, HLD, asthma, and degenerative joint disease [facility].   Assessment & Plan  1. Atypical Chest Pain  - presented with left-sided chest and neck pain for the past 2 months. Can last for 2-3 days at a time. No association with rest or exertion. -  pain along chest is reproducible with palpation. Neck pain worse with movements.  -  Initial troponin values at 0.06 and 0.03. EKG shows NSR, HR 75, and no acute ST or T-wave changes.  - overall, pain is atypical in that it is worse with particular body movements and worse with palpation, concerning for a MSK etiology.  - Echo normal no wall motion abnormalities.   2. HLD - Lipid Panel with LDL of 132.  - Not currently on statin therapy. Followed by PCP. Would start on statin.   3. HTN - BP well-controlled. Continue current medication regimen   Signed, Little IshikawaErin E Smith, NP  04/03/2016, 10:07 AM

## 2016-04-04 ENCOUNTER — Other Ambulatory Visit: Payer: Self-pay | Admitting: Physician Assistant

## 2016-04-04 DIAGNOSIS — Z23 Encounter for immunization: Secondary | ICD-10-CM | POA: Diagnosis not present

## 2016-04-04 DIAGNOSIS — J45901 Unspecified asthma with (acute) exacerbation: Principal | ICD-10-CM

## 2016-04-04 DIAGNOSIS — R079 Chest pain, unspecified: Secondary | ICD-10-CM

## 2016-04-04 MED ORDER — PREDNISONE 20 MG PO TABS
50.0000 mg | ORAL_TABLET | Freq: Every day | ORAL | Status: DC
  Administered 2016-04-04: 50 mg via ORAL
  Filled 2016-04-04: qty 2

## 2016-04-04 MED ORDER — ALBUTEROL SULFATE HFA 108 (90 BASE) MCG/ACT IN AERS: 2.0000 | INHALATION_SPRAY | Freq: Four times a day (QID) | RESPIRATORY_TRACT | 0 refills | Status: DC | PRN

## 2016-04-04 MED ORDER — PREDNISONE 10 MG PO TABS: ORAL_TABLET | ORAL | 0 refills | Status: DC

## 2016-04-04 MED ORDER — ALBUTEROL SULFATE 108 (90 BASE) MCG/ACT IN AEPB: 2.0000 | INHALATION_SPRAY | Freq: Four times a day (QID) | RESPIRATORY_TRACT | 0 refills | Status: DC | PRN

## 2016-04-04 NOTE — Discharge Summary (Addendum)
Physician Discharge Summary  Holly WISEMAN ZOX:[id number] DOB: 05-22-1956  PCP: Myrlene Broker, MD  Admit date: 04/01/2016 Discharge date: 04/04/2016  Admitted From: Home Disposition:  Home  Recommendations for Outpatient Follow-up:  1. Dr. Hillard Danker, PCP in one week with repeat labs (CBC & BMP). 2. Dr. Armanda Magic, Cardiology: MDs office will call patient with appointment for outpatient stress test and follow-up.  Home Health: None Equipment/Devices: None    Discharge Condition: Improved and stable.  CODE STATUS: Full  Diet recommendation: Heart healthy diet  Discharge Diagnoses:  Principal Problem:   Asthma exacerbation Active Problems:   Essential hypertension   Chest pain   Hyperlipidemia   Pain in the chest   Brief/Interim Summary: 60 year old female with a PMH of asthma (infrequent exacerbations), HTN, HLD, former smoker, exposed to passive smoking from spouse, morbid obesity, history of MI in father at age 52 years, presented to ED with complaints of worsening dyspnea, wheezing and several months history of mid sternal burning chest discomfort, radiating to left side of neck and left upper extremity concerning for unstable angina. Admitted for asthma exacerbation and chest pain evaluation. Cardiology consulted 9/23.   Assessment & Plan:   Asthma exacerbation - Reports infrequent exacerbations and the last episode was in January 2017. Reports that she ran out of her albuterol inhaler and has not been using anything since current symptoms began. Former smoker. - Treated with IV Solu-Medrol, oxygen, bronchodilator nebulizations - Recommended outpatient PFTs after complete resolution of acute exacerbation to differentiate between asthma versus COPD. - Improved. Transitioned to oral prednisone taper at discharge. Provided prescription for albuterol inhaler. - Leukocytosis likely related to steroids. Clinically no infectious etiology.  Addendum - Received  a call that her insurance does not cover Proventil and was asked to change to pro-air Respiclick inhaler> sent to her pharmacy  Recurrent chest pain - Initially there was concern for cardiac etiology for her chest pain. Cardiology was consulted. They indicated that her chest pain is atypical consistent with musculoskeletal etiology. Echo with normal LVEF. No further inpatient workup and cardiology will arrange for outpatient nuclear stress test and have cleared her for discharge from cardiac standpoint. - May consider outpatient MRI of C-spine to be arranged via her PCP for further evaluation of her neck pain. Discussed with patient verbalizes understanding.  Essential hypertension - Continue lisinopril/HCTZ. Controlled.  Hyperlipidemia  - Not on medications PTA. LDL 132. Consider statins during outpatient follow-up.   Hypokalemia - Replaced.   Anemia - Stable. Outpatient follow-up.  Morbid obesity/Body mass index is 55.58 kg/m. - Recommend diet, exercise and weight loss when able.    Consultants:   Cardiology  Procedures:   2-D echo 04/02/16: Study Conclusions  - Left ventricle: The cavity size was normal. Systolic function was vigorous. The estimated ejection fraction was in the range of 65% to 70%. Wall motion was normal; there were no regional wall motion abnormalities. There was an increased relative contribution of atrial contraction to ventricular filling. Doppler parameters are consistent with abnormal left ventricular relaxation (grade 1 diastolic dysfunction). - Aortic valve: Trileaflet; mildly thickened, mildly calcified leaflets. - Mitral valve: Calcified annulus. - Left atrium: The atrium was mildly dilated.   Discharge Instructions  Discharge Instructions    Call MD for:  difficulty breathing, headache or visual disturbances    Complete by:  As directed    Call MD for:  extreme fatigue    Complete by:  As directed    Call MD  for:  persistant dizziness or light-headedness    Complete by:  As directed    Call MD for:  severe uncontrolled pain    Complete by:  As directed    Diet - low sodium heart healthy    Complete by:  As directed    Increase activity slowly    Complete by:  As directed        Medication List    STOP taking these medications   traMADol 50 MG tablet Commonly known as:  ULTRAM     TAKE these medications   Albuterol Sulfate 108 (90 Base) MCG/ACT Aepb Commonly known as:  PROAIR RESPICLICK Inhale 2 puffs into the lungs every 6 (six) hours as needed. Wheezing or difficulty breathing.   HYDROcodone-acetaminophen 5-325 MG tablet Commonly known as:  NORCO/VICODIN Take 1-2 tablets by mouth every 6 (six) hours as needed.   lisinopril-hydrochlorothiazide 20-12.5 MG tablet Commonly known as:  ZESTORETIC Take 2 tablets by mouth daily.   methocarbamol 500 MG tablet Commonly known as:  ROBAXIN Take 1 tablet (500 mg total) by mouth every 8 (eight) hours as needed for muscle spasms.   predniSONE 10 MG tablet Commonly known as:  DELTASONE 4 tabs daily for 3 days, then 3 tabs daily for 3 days, then 2 tabs daily for 3 days, then 1 tab daily for 3 days, then stop. Start taking on:  04/05/2016      Follow-up Information    Armanda Magic, MD .   Specialty:  Cardiology Why:  The office will call you to make an appointment for a stress test and follow up with our office. Contact information: [address] [place] [address] [phone]        Myrlene Broker, MD. Schedule an appointment as soon as possible for a visit in 1 week(s).   Specialty:  Internal Medicine Why:  To be seen with repeat labs (CBC & BMP). Contact information: [address] [place] [address] [phone]          Allergies  Allergen Reactions  . Ketorolac Tromethamine Shortness Of Breath and Palpitations  . Atrovent [Ipratropium] Hives and Rash  . Latex Other (See Comments)     Powdered. Confirm type of reaction with patient.    Procedures/Studies: Dg Chest 2 View  Result Date: 04/01/2016 CLINICAL DATA:  Cough, fever, shortness of breath for 1 week. History of hypertension. Nonsmoker. EXAM: CHEST  2 VIEW COMPARISON:  08/23/2014 FINDINGS: The heart size and mediastinal contours are within normal limits. Both lungs are clear. The visualized skeletal structures are unremarkable. IMPRESSION: No active cardiopulmonary disease. Electronically Signed   By: Burman Nieves M.D.   On: 04/01/2016 23:40      Subjective: Denies dyspnea or chest pain. No other complaints reported. As per RN, no acute issues.  Discharge Exam:  Vitals:   04/03/16 1449 04/03/16 2130 04/04/16 0541 04/04/16 0822  BP:  129/67 (!) 147/83   Pulse:  75 (!) 49   Resp:      Temp:  98.4 F (36.9 C) 97.9 F (36.6 C)   TempSrc:  Oral Oral   SpO2: 100% 97% 94% 100%  Weight:   (!) 148.7 kg (327 lb 12.8 oz)   Height:        General exam: Pleasant middle-aged female, moderately built and morbidly obese,Seen ambulating comfortably in her room. Respiratory system: Clear to auscultation. Respiratory effort normal. Reproducible tenderness left upper anterior chest/just below the clavicle. Cardiovascular system: S1 & S2  heard, RRR. No JVD, murmurs, rubs, gallops or clicks. No pedal edema. Telemetry: SR-SB in 50's at night likely during sleep. Gastrointestinal system: Abdomen is nondistended, soft and nontender. No organomegaly or masses felt. Normal bowel sounds heard. Central nervous system: Alert and oriented. No focal neurological deficits. Extremities: Symmetric 5 x 5 power. Skin: No rashes, lesions or ulcers Psychiatry: Judgement and insight appear normal. Mood & affect appropriate.     The results of significant diagnostics from this hospitalization (including imaging, microbiology, ancillary and laboratory) are listed below for reference.     Microbiology: No results found for this or any  previous visit (from the past 240 hour(s)).   Labs: BNP (last 3 results) No results for input(s): BNP in the last 8760 hours. Basic Metabolic Panel:  Recent Labs Lab 04/02/16 0224 04/03/16 0430  NA 142 139  K 3.0* 4.4  CL 108 107  CO2 22 23  GLUCOSE 137* 135*  BUN 15 17  CREATININE 0.88 0.86  CALCIUM 10.0 10.2  MG  --  2.6*   Liver Function Tests: No results for input(s): AST, ALT, ALKPHOS, BILITOT, PROT, ALBUMIN in the last 168 hours. No results for input(s): LIPASE, AMYLASE in the last 168 hours. No results for input(s): AMMONIA in the last 168 hours. CBC:  Recent Labs Lab 04/02/16 0224 04/03/16 0430  WBC 6.5 15.8*  HGB 11.1* 10.8*  HCT 36.3 35.2*  MCV 90.8 90.5  PLT 280 315   Cardiac Enzymes:  Recent Labs Lab 04/02/16 0531 04/02/16 0737 04/02/16 1047  TROPONINI 0.06* <0.03 0.03*   BNP: Invalid input(s): POCBNP CBG: No results for input(s): GLUCAP in the last 168 hours. D-Dimer No results for input(s): DDIMER in the last 72 hours. Hgb A1c  Recent Labs  04/02/16 0531  HGBA1C 5.7*   Lipid Profile  Recent Labs  04/02/16 0531  CHOL 194  HDL 52  LDLCALC 132*  TRIG 50  CHOLHDL 3.7    Discussed with patient's spouse at bedside.  Time coordinating discharge: Over 30 minutes  SIGNED:  Marcellus ScottHONGALGI,Sunni Richardson, MD, FACP, FHM. [facility] Pager [phone]  If 7PM-7AM, please contact night-coverage [email or web address] Password [email or web address] 04/04/2016, 3:48 PM

## 2016-04-04 NOTE — Discharge Instructions (Signed)
Asthma, Adult Asthma is a recurring condition in which the airways tighten and narrow. Asthma can make it difficult to breathe. It can cause coughing, wheezing, and shortness of breath. Asthma episodes, also called asthma attacks, range from minor to life-threatening. Asthma cannot be cured, but medicines and lifestyle changes can help control it. CAUSES Asthma is believed to be caused by inherited (genetic) and environmental factors, but its exact cause is unknown. Asthma may be triggered by allergens, lung infections, or irritants in the air. Asthma triggers are different for each person. Common triggers include:   Animal dander.  Dust mites.  Cockroaches.  Pollen from trees or grass.  Mold.  Smoke.  Air pollutants such as dust, household cleaners, hair sprays, aerosol sprays, paint fumes, strong chemicals, or strong odors.  Cold air, weather changes, and winds (which increase molds and pollens in the air).  Strong emotional expressions such as crying or laughing hard.  Stress.  Certain medicines (such as aspirin) or types of drugs (such as beta-blockers).  Sulfites in foods and drinks. Foods and drinks that may contain sulfites include dried fruit, potato chips, and sparkling grape juice.  Infections or inflammatory conditions such as the flu, a cold, or an inflammation of the nasal membranes (rhinitis).  Gastroesophageal reflux disease (GERD).  Exercise or strenuous activity. SYMPTOMS Symptoms may occur immediately after asthma is triggered or many hours later. Symptoms include:  Wheezing.  Excessive nighttime or early morning coughing.  Frequent or severe coughing with a common cold.  Chest tightness.  Shortness of breath. DIAGNOSIS  The diagnosis of asthma is made by a review of your medical history and a physical exam. Tests may also be performed. These may include:  Lung function studies. These tests show how much air you breathe in and out.  Allergy  tests.  Imaging tests such as X-rays. TREATMENT  Asthma cannot be cured, but it can usually be controlled. Treatment involves identifying and avoiding your asthma triggers. It also involves medicines. There are 2 classes of medicine used for asthma treatment:   Controller medicines. These prevent asthma symptoms from occurring. They are usually taken every day.  Reliever or rescue medicines. These quickly relieve asthma symptoms. They are used as needed and provide short-term relief. Your health care provider will help you create an asthma action plan. An asthma action plan is a written plan for managing and treating your asthma attacks. It includes a list of your asthma triggers and how they may be avoided. It also includes information on when medicines should be taken and when their dosage should be changed. An action plan may also involve the use of a device called a peak flow meter. A peak flow meter measures how well the lungs are working. It helps you monitor your condition. HOME CARE INSTRUCTIONS   Take medicines only as directed by your health care provider. Speak with your health care provider if you have questions about how or when to take the medicines.  Use a peak flow meter as directed by your health care provider. Record and keep track of readings.  Understand and use the action plan to help minimize or stop an asthma attack without needing to seek medical care.  Control your home environment in the following ways to help prevent asthma attacks:  Do not smoke. Avoid being exposed to secondhand smoke.  Change your heating and air conditioning filter regularly.  Limit your use of fireplaces and wood stoves.  Get rid of pests (such as roaches  and mice) and their droppings.  Throw away plants if you see mold on them.  Clean your floors and dust regularly. Use unscented cleaning products.  Try to have someone else vacuum for you regularly. Stay out of rooms while they are  being vacuumed and for a short while afterward. If you vacuum, use a dust mask from a hardware store, a double-layered or microfilter vacuum cleaner bag, or a vacuum cleaner with a HEPA filter.  Replace carpet with wood, tile, or vinyl flooring. Carpet can trap dander and dust.  Use allergy-proof pillows, mattress covers, and box spring covers.  Wash bed sheets and blankets every week in hot water and dry them in a dryer.  Use blankets that are made of polyester or cotton.  Clean bathrooms and kitchens with bleach. If possible, have someone repaint the walls in these rooms with mold-resistant paint. Keep out of the rooms that are being cleaned and painted.  Wash hands frequently. SEEK MEDICAL CARE IF:   You have wheezing, shortness of breath, or a cough even if taking medicine to prevent attacks.  The colored mucus you cough up (sputum) is thicker than usual.  Your sputum changes from clear or white to yellow, green, gray, or bloody.  You have any problems that may be related to the medicines you are taking (such as a rash, itching, swelling, or trouble breathing).  You are using a reliever medicine more than 2-3 times per week.  Your peak flow is still at 50-79% of your personal best after following your action plan for 1 hour.  You have a fever. SEEK IMMEDIATE MEDICAL CARE IF:   You seem to be getting worse and are unresponsive to treatment during an asthma attack.  You are short of breath even at rest.  You get short of breath when doing very little physical activity.  You have difficulty eating, drinking, or talking due to asthma symptoms.  You develop chest pain.  You develop a fast heartbeat.  You have a bluish color to your lips or fingernails.  You are light-headed, dizzy, or faint.  Your peak flow is less than 50% of your personal best.   This information is not intended to replace advice given to you by your health care provider. Make sure you discuss any  questions you have with your health care provider.   Document Released: 06/27/2005 Document Revised: 03/18/2015 Document Reviewed: 01/24/2013 Elsevier Interactive Patient Education 2016 Else[employer] Inc[employer]    Nonspecific Chest Pain  Chest pain can be caused by many different conditions. There is always a chance that your pain could be related to something serious, such as a heart attack or a blood clot in your lungs. Chest pain can also be caused by conditions that are not life-threatening. If you have chest pain, it is very important to follow up with your health care provider. CAUSES  Chest pain can be caused by:  Heartburn.  Pneumonia or bronchitis.  Anxiety or stress.  Inflammation around your heart (pericarditis) or lung (pleuritis or pleurisy).  A blood clot in your lung.  A collapsed lung (pneumothorax). It can develop suddenly on its own (spontaneous pneumothorax) or from trauma to the chest.  Shingles infection (varicella-zoster virus).  Heart attack.  Damage to the bones, muscles, and cartilage that make up your chest wall. This can include:  Bruised bones due to injury.  Strained muscles or cartilage due to frequent or repeated coughing or overwork.  Fracture to one or more ribs.  Sore cartilage due to inflammation (costochondritis). RISK FACTORS  Risk factors for chest pain may include:  Activities that increase your risk for trauma or injury to your chest.  Respiratory infections or conditions that cause frequent coughing.  Medical conditions or overeating that can cause heartburn.  Heart disease or family history of heart disease.  Conditions or health behaviors that increase your risk of developing a blood clot.  Having had chicken pox (varicella zoster). SIGNS AND SYMPTOMS Chest pain can feel like:  Burning or tingling on the surface of your chest or deep in your chest.  Crushing, pressure, aching, or squeezing pain.  Dull or sharp pain that is  worse when you move, cough, or take a deep breath.  Pain that is also felt in your back, neck, shoulder, or arm, or pain that spreads to any of these areas. Your chest pain may come and go, or it may stay constant. DIAGNOSIS Lab tests or other studies may be needed to find the cause of your pain. Your health care provider may have you take a test called an ambulatory ECG (electrocardiogram). An ECG records your heartbeat patterns at the time the test is performed. You may also have other tests, such as:  Transthoracic echocardiogram (TTE). During echocardiography, sound waves are used to create a picture of all of the heart structures and to look at how blood flows through your heart.  Transesophageal echocardiogram (TEE).This is a more advanced imaging test that obtains images from inside your body. It allows your health care provider to see your heart in finer detail.  Cardiac monitoring. This allows your health care provider to monitor your heart rate and rhythm in real time.  Holter monitor. This is a portable device that records your heartbeat and can help to diagnose abnormal heartbeats. It allows your health care provider to track your heart activity for several days, if needed.  Stress tests. These can be done through exercise or by taking medicine that makes your heart beat more quickly.  Blood tests.  Imaging tests. TREATMENT  Your treatment depends on what is causing your chest pain. Treatment may include:  Medicines. These may include:  Acid blockers for heartburn.  Anti-inflammatory medicine.  Pain medicine for inflammatory conditions.  Antibiotic medicine, if an infection is present.  Medicines to dissolve blood clots.  Medicines to treat coronary artery disease.  Supportive care for conditions that do not require medicines. This may include:  Resting.  Applying heat or cold packs to injured areas.  Limiting activities until pain decreases. HOME CARE  INSTRUCTIONS  If you were prescribed an antibiotic medicine, finish it all even if you start to feel better.  Avoid any activities that bring on chest pain.  Do not use any tobacco products, including cigarettes, chewing tobacco, or electronic cigarettes. If you need help quitting, ask your health care provider.  Do not drink alcohol.  Take medicines only as directed by your health care provider.  Keep all follow-up visits as directed by your health care provider. This is important. This includes any further testing if your chest pain does not go away.  If heartburn is the cause for your chest pain, you may be told to keep your head raised (elevated) while sleeping. This reduces the chance that acid will go from your stomach into your esophagus.  Make lifestyle changes as directed by your health care provider. These may include:  Getting regular exercise. Ask your health care provider to suggest some activities that are  safe for you.  Eating a heart-healthy diet. A registered dietitian can help you to learn healthy eating options.  Maintaining a healthy weight.  Managing diabetes, if necessary.  Reducing stress. SEEK MEDICAL CARE IF:  Your chest pain does not go away after treatment.  You have a rash with blisters on your chest.  You have a fever. SEEK IMMEDIATE MEDICAL CARE IF:   Your chest pain is worse.  You have an increasing cough, or you cough up blood.  You have severe abdominal pain.  You have severe weakness.  You faint.  You have chills.  You have sudden, unexplained chest discomfort.  You have sudden, unexplained discomfort in your arms, back, neck, or jaw.  You have shortness of breath at any time.  You suddenly start to sweat, or your skin gets clammy.  You feel nauseous or you vomit.  You suddenly feel light-headed or dizzy.  Your heart begins to beat quickly, or it feels like it is skipping beats. These symptoms may represent a serious  problem that is an emergency. Do not wait to see if the symptoms will go away. Get medical help right away. Call your local emergency services (911 in the [place] Do not drive yourself to the hospital.   This information is not intended to replace advice given to you by your health care provider. Make sure you discuss any questions you have with your health care provider.   Document Released: 04/06/2005 Document Revised: 07/18/2014 Document Reviewed: 01/31/2014 Elsevier Interactive Patient Education [employer]

## 2016-04-08 ENCOUNTER — Telehealth: Payer: Self-pay | Admitting: Cardiology

## 2016-04-08 NOTE — Telephone Encounter (Signed)
New message    Call patient to set up discharge appt .   Patient verbalize she needs  more details as to why she need to have nuclear stress test . Was told in the hospital her heart is fine.    Holly HoraKathryn R Thompson, PA-C  [facility] Scheduling        Needs outpatient stress test set up and then office follow up with APP (Dr. Mayford Knifeurner)

## 2016-04-08 NOTE — Telephone Encounter (Signed)
Called patient, informed that Dr Mayford Knifeurner and her nurse were off today and wouldn't be able to address her concerns until Monday.  I told her I would forward this call to Dr Mayford Knifeurner and Orpha BurKaty.

## 2016-04-10 NOTE — Telephone Encounter (Signed)
The echo was normal but this only looks at heart muscle and valves and not the blood vessels.  We cannot evaluate for blockages in the arteries of her heart that can cause CP without the stress test.  She has multiple risk factors for CAD

## 2016-04-11 NOTE — Telephone Encounter (Signed)
Explained to patient that an echo looks at heart structure while a stress test looks at the blood flow to the heart. She was grateful for distinction and agrees to have stress test. Message sent to scheduling to arrange appointment.

## 2016-04-13 ENCOUNTER — Telehealth (HOSPITAL_COMMUNITY): Payer: Self-pay | Admitting: *Deleted

## 2016-04-13 NOTE — Telephone Encounter (Signed)
Left message on voicemail in reference to upcoming appointment scheduled for 04/18/16. Phone number given for a call back so details instructions can be given.   Ricky AlaSmith, Everest Brod Jacqueline

## 2016-04-18 ENCOUNTER — Ambulatory Visit (HOSPITAL_COMMUNITY): Payer: Medicare Other | Attending: Cardiovascular Disease

## 2016-04-18 ENCOUNTER — Telehealth (HOSPITAL_COMMUNITY): Payer: Self-pay | Admitting: *Deleted

## 2016-04-18 DIAGNOSIS — R079 Chest pain, unspecified: Secondary | ICD-10-CM | POA: Insufficient documentation

## 2016-04-18 DIAGNOSIS — R0609 Other forms of dyspnea: Secondary | ICD-10-CM | POA: Insufficient documentation

## 2016-04-18 DIAGNOSIS — I10 Essential (primary) hypertension: Secondary | ICD-10-CM | POA: Diagnosis not present

## 2016-04-18 MED ORDER — TECHNETIUM TC 99M TETROFOSMIN IV KIT
32.7000 | PACK | Freq: Once | INTRAVENOUS | Status: AC | PRN
  Administered 2016-04-18: 32.7 via INTRAVENOUS
  Filled 2016-04-18: qty 33

## 2016-04-18 MED ORDER — REGADENOSON 0.4 MG/5ML IV SOLN
0.4000 mg | Freq: Once | INTRAVENOUS | Status: AC
  Administered 2016-04-18: 0.4 mg via INTRAVENOUS

## 2016-04-18 NOTE — Telephone Encounter (Signed)
Patient given detailed instructions per Myocardial Perfusion Study Information Sheet for the test on 04/18/16 at 12:45. Patient notified to arrive 15 minutes early and that it is imperative to arrive on time for appointment to keep from having the test rescheduled.  If you need to cancel or reschedule your appointment, please call the office within 24 hours of your appointment. Failure to do so may result in a cancellation of your appointment, and a $50 no show fee. Patient verbalized understanding.Daneil DolinSharon S Brooks

## 2016-04-19 ENCOUNTER — Ambulatory Visit (HOSPITAL_COMMUNITY): Payer: Medicare Other | Attending: Cardiovascular Disease

## 2016-04-19 LAB — MYOCARDIAL PERFUSION IMAGING
LV dias vol: 144 mL (ref 46–106)
LV sys vol: 62 mL
Peak HR: 97 {beats}/min
RATE: 0.25
Rest HR: 67 {beats}/min
SDS: 2
SRS: 3
SSS: 5
TID: 0.93

## 2016-04-19 MED ORDER — TECHNETIUM TC 99M TETROFOSMIN IV KIT
31.5000 | PACK | Freq: Once | INTRAVENOUS | Status: AC | PRN
  Administered 2016-04-19: 31.5 via INTRAVENOUS
  Filled 2016-04-19: qty 32

## 2016-06-10 ENCOUNTER — Emergency Department (HOSPITAL_COMMUNITY)
Admission: EM | Admit: 2016-06-10 | Discharge: 2016-06-11 | Disposition: A | Discharge status: Home / Self Care | Payer: Medicare Other | Attending: Emergency Medicine | Admitting: Emergency Medicine

## 2016-06-10 ENCOUNTER — Emergency Department (HOSPITAL_COMMUNITY): Payer: Medicare Other

## 2016-06-10 ENCOUNTER — Encounter (HOSPITAL_COMMUNITY): Payer: Self-pay | Admitting: *Deleted

## 2016-06-10 DIAGNOSIS — Z91040 Latex allergy status: Secondary | ICD-10-CM | POA: Insufficient documentation

## 2016-06-10 DIAGNOSIS — Z87891 Personal history of nicotine dependence: Secondary | ICD-10-CM | POA: Diagnosis not present

## 2016-06-10 DIAGNOSIS — Z79899 Other long term (current) drug therapy: Secondary | ICD-10-CM | POA: Diagnosis not present

## 2016-06-10 DIAGNOSIS — Z96652 Presence of left artificial knee joint: Secondary | ICD-10-CM | POA: Diagnosis not present

## 2016-06-10 DIAGNOSIS — I880 Nonspecific mesenteric lymphadenitis: Secondary | ICD-10-CM

## 2016-06-10 DIAGNOSIS — I10 Essential (primary) hypertension: Secondary | ICD-10-CM | POA: Diagnosis not present

## 2016-06-10 DIAGNOSIS — R1010 Upper abdominal pain, unspecified: Secondary | ICD-10-CM | POA: Diagnosis not present

## 2016-06-10 DIAGNOSIS — R109 Unspecified abdominal pain: Secondary | ICD-10-CM | POA: Diagnosis not present

## 2016-06-10 DIAGNOSIS — J45909 Unspecified asthma, uncomplicated: Secondary | ICD-10-CM | POA: Diagnosis not present

## 2016-06-10 DIAGNOSIS — R1084 Generalized abdominal pain: Secondary | ICD-10-CM

## 2016-06-10 LAB — URINALYSIS, ROUTINE W REFLEX MICROSCOPIC
Bilirubin Urine: NEGATIVE
Glucose, UA: NEGATIVE mg/dL
Hgb urine dipstick: NEGATIVE
Ketones, ur: NEGATIVE mg/dL
Nitrite: NEGATIVE
Protein, ur: NEGATIVE mg/dL
Specific Gravity, Urine: 1.019 (ref 1.005–1.030)
pH: 6.5 (ref 5.0–8.0)

## 2016-06-10 LAB — CBC
HCT: 38.1 % (ref 36.0–46.0)
Hemoglobin: 12.4 g/dL (ref 12.0–15.0)
MCH: 28.4 pg (ref 26.0–34.0)
MCHC: 32.5 g/dL (ref 30.0–36.0)
MCV: 87.2 fL (ref 78.0–100.0)
Platelets: 308 10*3/uL (ref 150–400)
RBC: 4.37 MIL/uL (ref 3.87–5.11)
RDW: 14.2 % (ref 11.5–15.5)
WBC: 4.6 10*3/uL (ref 4.0–10.5)

## 2016-06-10 LAB — COMPREHENSIVE METABOLIC PANEL
ALT: 10 U/L — ABNORMAL LOW (ref 14–54)
AST: 16 U/L (ref 15–41)
Albumin: 4.1 g/dL (ref 3.5–5.0)
Alkaline Phosphatase: 97 U/L (ref 38–126)
Anion gap: 8 (ref 5–15)
BUN: 11 mg/dL (ref 6–20)
CO2: 24 mmol/L (ref 22–32)
Calcium: 9.4 mg/dL (ref 8.9–10.3)
Chloride: 106 mmol/L (ref 101–111)
Creatinine, Ser: 0.72 mg/dL (ref 0.44–1.00)
GFR calc Af Amer: >60 mL/min (ref >60)
GFR calc non Af Amer: >60 mL/min (ref >60)
Glucose, Bld: 82 mg/dL (ref 65–99)
Potassium: 3.6 mmol/L (ref 3.5–5.1)
Sodium: 138 mmol/L (ref 135–145)
Total Bilirubin: 0.6 mg/dL (ref 0.3–1.2)
Total Protein: 7.5 g/dL (ref 6.5–8.1)

## 2016-06-10 LAB — URINE MICROSCOPIC-ADD ON

## 2016-06-10 LAB — LIPASE, BLOOD: Lipase: 21 U/L (ref 11–51)

## 2016-06-10 MED ORDER — SODIUM CHLORIDE 0.9 % IJ SOLN
INTRAMUSCULAR | Status: AC
  Filled 2016-06-10: qty 50

## 2016-06-10 MED ORDER — IOPAMIDOL (ISOVUE-300) INJECTION 61%
INTRAVENOUS | Status: AC
  Filled 2016-06-10: qty 100

## 2016-06-10 MED ORDER — IOPAMIDOL (ISOVUE-300) INJECTION 61%
100.0000 mL | Freq: Once | INTRAVENOUS | Status: AC | PRN
  Administered 2016-06-11: 100 mL via INTRAVENOUS

## 2016-06-10 NOTE — ED Triage Notes (Signed)
Pt reports hx of hernia and is currently having abd pain and back pain.  Pt reports tightness in abd.  Pt reports pain begins when pt eats.  Pt reports nausea.

## 2016-06-10 NOTE — ED Provider Notes (Signed)
[facility]-EMERGENCY DEPT Provider Note   CSN: [id number] Arrival date & time: 06/10/16  1738  By signing my name below, I, Linus Galas, attest that this documentation has been prepared under the direction and in the presence of  Earley Favor, NP. Electronically Signed: Linus Galas, ED Scribe. 06/10/16. 9:25 PM.  History   Chief Complaint Chief Complaint  Patient presents with  . Abdominal Pain   The history is provided by the patient. No language interpreter was used.   HPI Comments: [name] is a 60 y.o. female who presents to the Emergency Department with a PMHx of umbilical hernia, HLD, and HTN complaining of ongoing, constant diffuse abdominal pain for the past one week. Pt also reports a subjective fever and chills. Pt states her pain is worse after eating. Pt denies any OTC medication for pain. Pt denies any constipation, CP, SOB, N/V/D, dysuria, or any other symptoms at this time.  Past Medical History:  Diagnosis Date  . Abnormal EKG 06/2003   History of inverted T waves V1-V3. Normal 2D echo (07/23/2003): LVEF 65%.  . Anemia    BL Hgb 11-12. Ferritin 14 - low normal (08/2007). Colonoscopy 2009 - external hemorrhoids (excellent prep). Last anemia panel (12/2010) - Iron  24, TIBC 269,  B12  316, Folate 11.9, Ferritin 73.  . Asthma   . Degenerative joint disease    BL knees (L>R), lumbar spine. Followed by Sports Medicine, Dr. Jennette Kettle.  . History of multiple pulmonary nodules    Incidental finding: CT Abd/ Pelvis (04/2010) - Several small lower lobe lung nodules, including one pure ground-glass pulmonary nodule measuring 8 mm in the left lower lobe.  Recommend follow-up chest CT (IV contrast preferred) in 6 months to document stability. //  CT Abd/ Pelvis (07/2010) -  3 mm RLL and  8 mm LLL nodule stable.  Other nodules unchanged, likely benign.  . Hyperlipidemia   . Hypertension   . Incarcerated ventral hernia 04/2010   Noted on CT Abd/ pelvis (04/2010). Patient now s/p  ventral hernia repair by Dr. Gerrit Friends (12/2010)  . 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 Lower extremity edema    Chronic. 2D echo (2005) - EF 65%.  . Obesity   . Seasonal allergies   . Wears dentures   . Wears glasses     Patient Active Problem List   Diagnosis Date Noted  . Asthma exacerbation 04/02/2016  . Chest pain 04/02/2016  . Hyperlipidemia 04/02/2016  . Pain in the chest   . Morbid obesity with BMI of 50.0-59.9, adult (HCC) 11/21/2011  . Preventative health care 09/10/2010  . LUNG NODULE 05/10/2010  . Low back pain 01/20/2009  . Osteoarthritis 06/02/2008  . HYPERLIPIDEMIA 08/15/2006  . ANEMIA-NOS 08/15/2006  . Essential hypertension 08/11/2006  . ASTHMA 08/11/2006    Past Surgical History:  Procedure Laterality Date  . FOOT SURGERY  2004    left  . INCISION AND DRAINAGE ABSCESS ANAL  07/2008   I&D and debridgement of anorectal abscess  . INCISIONAL HERNIA REPAIR  12/2010   Repair of incarcerated ventral incisional hernia with Ethicon mesh patch - performed by Dr. Gerrit Friends.   . INGUINAL HERNIA REPAIR    . JOINT REPLACEMENT Left 2013   left knee  . TOTAL KNEE ARTHROPLASTY  06/16/2011   Left TOTAL KNEE ARTHROPLASTY;  Surgeon: Kennieth Rad;  Location: [facility] OR;  Service: Orthopedics;  Laterality: Left;  left total knee arthroplasty  . TUBAL  LIGATION      OB History    No data available       Home Medications    Prior to Admission medications   Medication Sig Start Date End Date Taking? Authorizing Provider  Albuterol Sulfate (PROAIR RESPICLICK) 108 (90 Base) MCG/ACT AEPB Inhale 2 puffs into the lungs every 6 (six) hours as needed. Wheezing or difficulty breathing. 04/04/16  Yes Elease Etienne, MD  lisinopril-hydrochlorothiazide (PRINZIDE,ZESTORETIC) 20-12.5 MG tablet Take 1 tablet by mouth daily.   Yes Historical Provider, MD  HYDROcodone-acetaminophen (NORCO/VICODIN) 5-325 MG tablet Take 1-2 tablets by mouth every 6 (six) hours  as needed. Patient not taking: Reported on 06/10/2016 04/24/15   Roxy Horseman, PA-C  lisinopril-hydrochlorothiazide (ZESTORETIC) 20-12.5 MG per tablet Take 2 tablets by mouth daily. 05/13/14 04/02/16  Ejiroghene Wendall Stade, MD  methocarbamol (ROBAXIN) 500 MG tablet Take 1 tablet (500 mg total) by mouth every 8 (eight) hours as needed for muscle spasms. Patient not taking: Reported on 06/10/2016 10/13/14   Lenda Kelp, MD  predniSONE (DELTASONE) 10 MG tablet 4 tabs daily for 3 days, then 3 tabs daily for 3 days, then 2 tabs daily for 3 days, then 1 tab daily for 3 days, then stop. Patient not taking: Reported on 06/10/2016 04/05/16   Elease Etienne, MD  traMADol (ULTRAM) 50 MG tablet Take 1 tablet (50 mg total) by mouth every 6 (six) hours as needed. 06/11/16   Earley Favor, NP    Family History Family History  Problem Relation Age of Onset  . Diabetes Mother   . Hypertension Mother   . Stroke Mother   . Dementia Father   . Heart disease Father   . Hypertension Sister   . Diabetes Brother   . Hypertension Brother   . Rashes / Skin problems Maternal Grandfather   . Heart attack Neg Hx     Social History Social History  Substance Use Topics  . Smoking status: Former Smoker    Packs/day: 0.50    Years: 20.00    Types: Cigarettes    Quit date: 09/10/1998  . Smokeless tobacco: Never Used  . Alcohol use No     Allergies   Ketorolac tromethamine; Atrovent [ipratropium]; and Latex   Review of Systems Review of Systems  Constitutional: Positive for chills and fever.  Respiratory: Negative for shortness of breath.   Cardiovascular: Negative for chest pain.  Gastrointestinal: Positive for abdominal pain. Negative for constipation, diarrhea, nausea and vomiting.  Genitourinary: Negative for dysuria.  All other systems reviewed and are negative.  Physical Exam Updated Vital Signs BP 155/60   Pulse 75   Temp 99.1 F (37.3 C) (Oral)   Resp 22   Ht 5\' 5"  (1.651 m)   Wt 132 kg    LMP 09/09/2009   SpO2 97%   BMI 48.42 kg/m   Physical Exam  Constitutional: She appears well-developed and well-nourished. No distress.  HENT:  Head: Normocephalic.  Eyes: Pupils are equal, round, and reactive to light.  Neck: Normal range of motion.  Cardiovascular: Normal rate.   Pulmonary/Chest: Effort normal.  Abdominal: Soft. Bowel sounds are normal. She exhibits no distension. There is generalized tenderness.    Musculoskeletal: Normal range of motion.  Neurological: She is alert.  Skin: Skin is warm.  Psychiatric: She has a normal mood and affect.  Nursing note and vitals reviewed.    ED Treatments / Results  DIAGNOSTIC STUDIES: Oxygen Saturation is 97% on room air, normal by my interpretation.  COORDINATION OF CARE: 9:32 PM Discussed treatment plan with pt at bedside and pt agreed to plan.  Labs (all labs ordered are listed, but only abnormal results are displayed) Labs Reviewed  COMPREHENSIVE METABOLIC PANEL - Abnormal; Notable for the following:       Result Value   ALT 10 (*)    All other components within normal limits  URINALYSIS, ROUTINE W REFLEX MICROSCOPIC (NOT AT [facility]) - Abnormal; Notable for the following:    Leukocytes, UA MODERATE (*)    All other components within normal limits  URINE MICROSCOPIC-ADD ON - Abnormal; Notable for the following:    Squamous Epithelial / LPF 0-5 (*)    Bacteria, UA FEW (*)    All other components within normal limits  LIPASE, BLOOD  CBC    EKG  EKG Interpretation None       Radiology Ct Abdomen Pelvis W Contrast  Result Date: 06/11/2016 CLINICAL DATA:  Abdomen and back pain nausea EXAM: CT ABDOMEN AND PELVIS WITH CONTRAST TECHNIQUE: Multidetector CT imaging of the abdomen and pelvis was performed using the standard protocol following bolus administration of intravenous contrast. CONTRAST:  100mL ISOVUE-300 IOPAMIDOL (ISOVUE-300) INJECTION 61% COMPARISON:  06/10/2016, CT 05/19/2014 FINDINGS: Lower chest:  Stable 7 mm pulmonary nodule in the subpleural left lower lobe. Minimal ground-glass density medial right lung base could relate to mild inflammatory change. No consolidation or effusion. Hepatobiliary: No focal liver abnormality is seen. No gallstones, gallbladder wall thickening, or biliary dilatation. Pancreas: Unremarkable. No pancreatic ductal dilatation or surrounding inflammatory changes. Spleen: Normal in size without focal abnormality. Adrenals/Urinary Tract: Adrenal glands are unremarkable. Kidneys are normal, without renal calculi, focal lesion, or hydronephrosis. Bladder is unremarkable. Stomach/Bowel: Stomach is within normal limits. Appendix appears normal. No evidence of bowel wall thickening, distention, or inflammatory changes. Vascular/Lymphatic: Atherosclerosis of the aorta. Mild hazy appearance of the mesenteric root with multiple small lymph nodes. Prominent external iliac nodes unchanged. Reproductive: Uterus and bilateral adnexa are unremarkable. Other: No free air or free fluid. Fatty containing peri umbilical hernia. Musculoskeletal: No acute or significant osseous findings. A lucent lesion in the left acetabulum is unchanged. IMPRESSION: 1. No CT evidence for acute intra-abdominal or pelvic pathology. 2. Mild hazy infiltration of the mesenteric root with several prominent mesenteric lymph nodes, possibly related to adenitis. 3. Stable 7 mm nodule in the left lower lobe, benign. Electronically Signed   By: Jasmine PangKim  Fujinaga M.D.   On: 06/11/2016 01:23   Dg Abd Acute W/chest  Result Date: 06/10/2016 CLINICAL DATA:  Upper abdominal pain with nausea and diarrhea times 2-3 days EXAM: DG ABDOMEN ACUTE W/ 1V CHEST COMPARISON:  04/01/2016 CXR, 05/19/2014 CT abdomen and pelvis FINDINGS: There is no evidence of dilated bowel loops or free intraperitoneal air. No radiopaque calculi or other significant radiographic abnormality is seen. Heart size and mediastinal contours are within normal limits. Both  lungs are clear. IMPRESSION: Negative abdominal radiographs.  No acute cardiopulmonary disease. Electronically Signed   By: Tollie Ethavid  Kwon M.D.   On: 06/10/2016 23:00    Procedures Procedures (including critical care time)  Medications Ordered in ED Medications  iopamidol (ISOVUE-300) 61 % injection 100 mL (100 mLs Intravenous Contrast Given 06/11/16 0018)  HYDROcodone-acetaminophen (NORCO/VICODIN) 5-325 MG per tablet 1 tablet (1 tablet Oral Given 06/11/16 0143)     Initial Impression / Assessment and Plan / ED Course  I have reviewed the triage vital signs and the nursing notes.  Pertinent labs & imaging results that were available during  my care of the patient were reviewed by me and considered in my medical decision making (see chart for details).  Clinical Course   Patient with diffuse abdominal pain.  States that her bowel movements have changed from normal formed stool to semi-soft  Patient's CT scan reveals mesenteric adenitis.  Patient will be treated with Ultram for pain control and follow-up with her PCP   Final Clinical Impressions(s) / ED Diagnoses   Final diagnoses:  Generalized abdominal pain  Mesenteric adenitis    New Prescriptions New Prescriptions   TRAMADOL (ULTRAM) 50 MG TABLET    Take 1 tablet (50 mg total) by mouth every 6 (six) hours as needed.   I personally performed the services described in this documentation, which was scribed in my presence. The recorded information has been reviewed and is accurate.   Earley FavorGail Yamaris Cummings, NP 06/11/16 [id number]    Pricilla LovelessScott Goldston, MD 06/13/16 1024

## 2016-06-11 DIAGNOSIS — R109 Unspecified abdominal pain: Secondary | ICD-10-CM | POA: Diagnosis not present

## 2016-06-11 DIAGNOSIS — I880 Nonspecific mesenteric lymphadenitis: Secondary | ICD-10-CM | POA: Diagnosis not present

## 2016-06-11 MED ORDER — HYDROCODONE-ACETAMINOPHEN 5-325 MG PO TABS
1.0000 | ORAL_TABLET | Freq: Once | ORAL | Status: AC
  Administered 2016-06-11: 1 via ORAL

## 2016-06-11 MED ORDER — IOPAMIDOL (ISOVUE-300) INJECTION 61%
INTRAVENOUS | Status: AC
  Filled 2016-06-11: qty 100

## 2016-06-11 MED ORDER — OXYCODONE-ACETAMINOPHEN 5-325 MG PO TABS
ORAL_TABLET | ORAL | Status: AC
  Filled 2016-06-11: qty 1

## 2016-06-11 MED ORDER — HYDROCODONE-ACETAMINOPHEN 5-325 MG PO TABS
ORAL_TABLET | ORAL | Status: AC
  Filled 2016-06-11: qty 1

## 2016-06-11 MED ORDER — TRAMADOL HCL 50 MG PO TABS: 50.0000 mg | ORAL_TABLET | Freq: Four times a day (QID) | ORAL | 0 refills | Status: DC | PRN

## 2016-06-11 NOTE — Discharge Instructions (Signed)
CT scan shows that you have a condition called to mesenteric adenitis which is an inflammation of the lymph nodes in the membrane that contains your intestines as your intestines.  Moves in normal.  Also, as it pushes against the enlarged lymph nodes causing discomfort.  This is a self-limiting disease you do not need antibiotics for pain control.  Sometimes a heating pad to your abdomen can help as well

## 2016-06-20 DIAGNOSIS — R109 Unspecified abdominal pain: Secondary | ICD-10-CM | POA: Diagnosis not present

## 2016-06-20 DIAGNOSIS — Z9889 Other specified postprocedural states: Secondary | ICD-10-CM | POA: Diagnosis not present

## 2016-06-20 DIAGNOSIS — Z8719 Personal history of other diseases of the digestive system: Secondary | ICD-10-CM | POA: Diagnosis not present

## 2016-06-28 ENCOUNTER — Telehealth: Payer: Self-pay | Admitting: Internal Medicine

## 2016-06-28 NOTE — Telephone Encounter (Signed)
[facility] about her stomach.  Patient followed up with Dr. Gerrit FriendsGerkin with [facility].  States that doctor would like Dr. Okey Duprerawford to refer patient to GI for an endoscopy b/c patient has bacteria in her stomach and also states she may have IBS.  Patient states that doctor was to send notes over. Does patient need to schedule appt with Dr. Okey Duprerawford?

## 2016-06-28 NOTE — Telephone Encounter (Signed)
Needs visit with someone as she has not been in since 04/2015.

## 2016-06-30 NOTE — Telephone Encounter (Signed)
Left message for patient to call back to schedule appt.

## 2016-07-05 ENCOUNTER — Encounter: Payer: Self-pay | Admitting: Family

## 2016-07-05 ENCOUNTER — Ambulatory Visit (INDEPENDENT_AMBULATORY_CARE_PROVIDER_SITE_OTHER): Payer: Medicare Other | Admitting: Family

## 2016-07-05 VITALS — BP 128/98 | HR 75 | Temp 98.1°F | Resp 16 | Ht 65.0 in | Wt 325.0 lb

## 2016-07-05 DIAGNOSIS — R1030 Lower abdominal pain, unspecified: Secondary | ICD-10-CM

## 2016-07-05 MED ORDER — ACETAMINOPHEN-CODEINE #3 300-30 MG PO TABS: 1.0000 | ORAL_TABLET | ORAL | 0 refills | Status: DC | PRN

## 2016-07-05 MED ORDER — PANTOPRAZOLE SODIUM 40 MG PO TBEC
40.0000 mg | DELAYED_RELEASE_TABLET | Freq: Every day | ORAL | 3 refills | Status: DC
Start: 2016-07-05 — End: 2017-12-18

## 2016-07-05 NOTE — Progress Notes (Signed)
Subjective:    Patient ID: [name], female    DOB: 05-21-1956, 60 y.o.   MRN: [id number]  Chief Complaint  Patient presents with  . Hospitalization Follow-up    needs referral to GI     HPI:  Holly Blackbirdngela W Friedland is a 60 y.o. female who  has a past medical history of Abnormal EKG (06/2003); Anemia; Asthma; Degenerative joint disease; History of multiple pulmonary nodules; Hyperlipidemia; Hypertension; Incarcerated ventral hernia (04/2010); Knee osteoarthritis; Lower extremity edema; Obesity; Seasonal allergies; Wears dentures; and Wears glasses. and presents today for a follow up office visit.   Recently evaluated in the emergency department with ongoing, constant diffuse abdominal pain that started 1 week prior to presentation. She was noted to have subjective fevers and chills with pain worse after eating. Physical exam with generalized tenderness. CT scan with no evidence for acute intra-abdominal or pelvic pathology and mild hazy infiltration of the mesenteric root with several prominent mesenteric lymph nodes possibly related to adenitis. She was treated with tramadol and instructed to follow-up with primary care. All ED records, labs, and imaging reviewed in detail.  Since leaving the emergency department she continues to experience the associated symptoms of pain located across her lower abdomen with increased discomfort of the right lower quadrant. Describes a sharp pain and tightness located in her upper abdomen follow meals with a severity of the symptoms enough to effect her oral intake. Symptoms are slightly improved when she does not eat. The pains are generally constant. Reports taking the tramadol as prescribed and denies adverse side effects. Notes her symptoms are generally not controlled with the medication. Expresses some nausea on occasion. Denies constipation or diarrhea. No melena.  Allergies  Allergen Reactions  . Ketorolac Tromethamine Shortness Of Breath and  Palpitations  . Atrovent [Ipratropium] Hives and Rash  . Latex Other (See Comments)    Powdered. Confirm type of reaction with patient.      Outpatient Medications Prior to Visit  Medication Sig Dispense Refill  . Albuterol Sulfate (PROAIR RESPICLICK) 108 (90 Base) MCG/ACT AEPB Inhale 2 puffs into the lungs every 6 (six) hours as needed. Wheezing or difficulty breathing. 1 each 0  . lisinopril-hydrochlorothiazide (PRINZIDE,ZESTORETIC) 20-12.5 MG tablet Take 1 tablet by mouth daily.    . traMADol (ULTRAM) 50 MG tablet Take 1 tablet (50 mg total) by mouth every 6 (six) hours as needed. 30 tablet 0  . HYDROcodone-acetaminophen (NORCO/VICODIN) 5-325 MG tablet Take 1-2 tablets by mouth every 6 (six) hours as needed. 10 tablet 0  . methocarbamol (ROBAXIN) 500 MG tablet Take 1 tablet (500 mg total) by mouth every 8 (eight) hours as needed for muscle spasms. 60 tablet 1  . lisinopril-hydrochlorothiazide (ZESTORETIC) 20-12.5 MG per tablet Take 2 tablets by mouth daily. 60 tablet 11  . predniSONE (DELTASONE) 10 MG tablet 4 tabs daily for 3 days, then 3 tabs daily for 3 days, then 2 tabs daily for 3 days, then 1 tab daily for 3 days, then stop. (Patient not taking: Reported on 06/10/2016) 30 tablet 0   No facility-administered medications prior to visit.       Past Surgical History:  Procedure Laterality Date  . FOOT SURGERY  2004    left  . INCISION AND DRAINAGE ABSCESS ANAL  07/2008   I&D and debridgement of anorectal abscess  . INCISIONAL HERNIA REPAIR  12/2010   Repair of incarcerated ventral incisional hernia with Ethicon mesh patch - performed by Dr. Gerrit FriendsGerkin.   . INGUINAL  HERNIA REPAIR    . JOINT REPLACEMENT Left 2013   left knee  . TOTAL KNEE ARTHROPLASTY  06/16/2011   Left TOTAL KNEE ARTHROPLASTY;  Surgeon: Kennieth Rad;  Location: [facility] OR;  Service: Orthopedics;  Laterality: Left;  left total knee arthroplasty  . TUBAL LIGATION        Past Medical History:  Diagnosis Date  .  Abnormal EKG 06/2003   History of inverted T waves V1-V3. Normal 2D echo (07/23/2003): LVEF 65%.  . Anemia    BL Hgb 11-12. Ferritin 14 - low normal (08/2007). Colonoscopy 2009 - external hemorrhoids (excellent prep). Last anemia panel (12/2010) - Iron  24, TIBC 269,  B12  316, Folate 11.9, Ferritin 73.  . Asthma   . Degenerative joint disease    BL knees (L>R), lumbar spine. Followed by Sports Medicine, Dr. Jennette Kettle.  . History of multiple pulmonary nodules    Incidental finding: CT Abd/ Pelvis (04/2010) - Several small lower lobe lung nodules, including one pure ground-glass pulmonary nodule measuring 8 mm in the left lower lobe.  Recommend follow-up chest CT (IV contrast preferred) in 6 months to document stability. //  CT Abd/ Pelvis (07/2010) -  3 mm RLL and  8 mm LLL nodule stable.  Other nodules unchanged, likely benign.  . Hyperlipidemia   . Hypertension   . Incarcerated ventral hernia 04/2010   Noted on CT Abd/ pelvis (04/2010). Patient now s/p ventral hernia repair by Dr. Gerrit Friends (12/2010)  . 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 Lower extremity edema    Chronic. 2D echo (2005) - EF 65%.  . Obesity   . Seasonal allergies   . Wears dentures   . Wears glasses       Review of Systems  Constitutional: Positive for appetite change. Negative for chills and fever.  Respiratory: Negative for chest tightness and shortness of breath.   Cardiovascular: Negative for chest pain and leg swelling.  Gastrointestinal: Positive for abdominal pain. Negative for anal bleeding, blood in stool, constipation, diarrhea, nausea, rectal pain and vomiting.      Objective:    BP (!) 128/98 (BP Location: Left Arm, Patient Position: Sitting, Cuff Size: Large)   Pulse 75   Temp 98.1 F (36.7 C) (Oral)   Resp 16   Ht 5\' 5"  (1.651 m)   Wt (!) 325 lb (147.4 kg)   LMP 09/09/2009   SpO2 97%   BMI 54.08 kg/m  Nursing note and vital signs reviewed.  Physical Exam    Constitutional: She is oriented to person, place, and time. She appears well-developed and well-nourished. No distress.  Cardiovascular: Normal rate, regular rhythm, normal heart sounds and intact distal pulses.   Pulmonary/Chest: Effort normal and breath sounds normal.  Abdominal: Normal appearance and bowel sounds are normal. She exhibits no mass. There is no hepatosplenomegaly. There is tenderness in the epigastric area, suprapubic area and left lower quadrant. There is no rigidity, no guarding, no tenderness at McBurney's point and negative Murphy's sign.  Neurological: She is alert and oriented to person, place, and time.  Skin: Skin is warm and dry.  Psychiatric: She has a normal mood and affect. Her behavior is normal. Judgment and thought content normal.       Assessment & Plan:   Problem List Items Addressed This Visit      Other   Lower abdominal pain - Primary    Lower abdominal pain with concern for multiple underlying  conditions including mesenteric adenitis with possible peptic ulcer disease given symptoms described as gnawing following eating. No apparent life-threatening symptoms confirmed on CT scan. Discontinue tramadol. Start Tylenol No. 3 as needed for discomfort. Start pantoprazole. Refer to gastroenterology for further assessment and treatment. Follow-up if symptoms worsen or do not improve prior to referral.      Relevant Medications   acetaminophen-codeine (TYLENOL #3) 300-30 MG tablet   pantoprazole (PROTONIX) 40 MG tablet   Other Relevant Orders   Ambulatory referral to Gastroenterology       I have discontinued Ms. Pippenger's methocarbamol, HYDROcodone-acetaminophen, and predniSONE. I am also having her start on acetaminophen-codeine and pantoprazole. Additionally, I am having her maintain her lisinopril-hydrochlorothiazide, Albuterol Sulfate, lisinopril-hydrochlorothiazide, and traMADol.   Meds ordered this encounter  Medications  . acetaminophen-codeine  (TYLENOL #3) 300-30 MG tablet    Sig: Take 1 tablet by mouth every 4 (four) hours as needed for moderate pain.    Dispense:  90 tablet    Refill:  0    Order Specific Question:   Supervising Provider    Answer:   Hillard DankerRAWFORD, ELIZABETH A [[id number]]  . pantoprazole (PROTONIX) 40 MG tablet    Sig: Take 1 tablet (40 mg total) by mouth daily.    Dispense:  30 tablet    Refill:  3    Order Specific Question:   Supervising Provider    Answer:   Hillard DankerRAWFORD, ELIZABETH A [[id number]]     Follow-up: Return if symptoms worsen or fail to improve.  Jeanine Luzalone, Aarit Kashuba, FNP

## 2016-07-05 NOTE — Patient Instructions (Addendum)
Thank you for choosing [facility].  SUMMARY AND INSTRUCTIONS:  Please start the Tylenol 3 as needed for discomfort.  They will call to schedule your appointment with Gastroenterology.  Start the pantoprazole.  Ice/moist heat as needed x 20 minutes every 2 hours for the back.   Medication:  Your prescription(s) have been submitted to your pharmacy or been printed and provided for you. Please take as directed and contact our office if you believe you are having problem(s) with the medication(s) or have any questions.   Follow up:  If your symptoms worsen or fail to improve, please contact our office for further instruction, or in case of emergency go directly to the emergency room at the closest medical facility.    Peptic Ulcer A peptic ulcer is a sore in the lining of the esophagus (esophageal ulcer), the stomach (gastric ulcer), or the first part of the small intestine (duodenal ulcer). The ulcer causes gradual wearing away (erosion) into the deeper tissue. What are the causes? Normally, the lining of the stomach and the small intestine protects itself from the acid that digests food. The protective lining can be damaged by:  An infection caused by a germ (bacterium) called Helicobacter pylori or H. pylori.  Regular use of NSAIDs, such as ibuprofen or aspirin.  Rare tumors in the stomach, small intestine, or pancreas (Zollinger-Ellison syndrome). What increases the risk? The following factors may make you more likely to develop this condition:  Smoking.  Having a family history of ulcer disease. What are the signs or symptoms? Symptoms of this condition include:  Burning pain or gnawing in the area between the chest and the belly button. The pain may be worse on an empty stomach and at night.  Heartburn.  Nausea and vomiting.  Bloating. If the ulcer results in bleeding, it can cause:  Black, tarry stools.  Vomiting of bright red blood.  Vomiting of  material that looks like coffee grounds. How is this diagnosed? This condition may be diagnosed based on:  Medical history and physical exam.  Various tests or procedures, such as:  Blood tests, stool tests, or breath tests to check for the H. pylori bacterium.  An X-ray exam (upper gastrointestinal series) of the esophagus, stomach, and small intestine.  Upper endoscopy. The health care provider examines the esophagus, stomach, and small intestine using a small flexible tube that has a video camera at the end.  Biopsy. A tissue sample is removed to be examined under a microscope. How is this treated? Treatment for this condition may include:  Eliminating the cause of the ulcer, such as smoking or the use of NSAIDs or alcohol.  Medicines to reduce the amount of acid in your digestive tract.  Antibiotic medicines, if the ulcer is caused by the H. pylori bacterium.  An upper endoscopy to treat a bleeding ulcer.  Surgery, if the bleeding is severe or if the ulcer created a hole somewhere in the digestive system. Follow these instructions at home:  Avoid alcohol and caffeine.  Do not use any tobacco products, such as cigarettes, chewing tobacco, and e-cigarettes. If you need help quitting, ask your health care provider.  Take over-the-counter and prescription medicines only as told by your health care provider. Do not use over-the-counter medicines in place of prescription medicines unless your health care provider approves.  Keep all follow-up visits as told by your health care provider. This is important. Contact a health care provider if:  Your symptoms do not improve within  7 days of starting treatment.  You have ongoing indigestion or heartburn. Get help right away if:  You have sudden, sharp, or persistent pain in your abdomen.  You have bloody or dark black, tarry stools.  You vomit blood or material that looks like coffee grounds.  You become light-headed or you  feel faint.  You become weak.  You become sweaty or clammy. This information is not intended to replace advice given to you by your health care provider. Make sure you discuss any questions you have with your health care provider. Document Released: 06/24/2000 Document Revised: 11/30/2015 Document Reviewed: 03/28/2015 Elsevier Interactive Patient Education  2017 [employer]

## 2016-07-05 NOTE — Assessment & Plan Note (Signed)
Lower abdominal pain with concern for multiple underlying conditions including mesenteric adenitis with possible peptic ulcer disease given symptoms described as gnawing following eating. No apparent life-threatening symptoms confirmed on CT scan. Discontinue tramadol. Start Tylenol No. 3 as needed for discomfort. Start pantoprazole. Refer to gastroenterology for further assessment and treatment. Follow-up if symptoms worsen or do not improve prior to referral.

## 2016-07-06 ENCOUNTER — Encounter: Payer: Self-pay | Admitting: Internal Medicine

## 2016-08-17 NOTE — Progress Notes (Signed)
Subjective:   [name] is a 61 y.o. female who presents for Medicare Annual (Subsequent) preventive examination.  Review of Systems:  No ROS.  Medicare Wellness Visit.  Cardiac Risk Factors include: dyslipidemia;family history of premature cardiovascular disease;hypertension;obesity (BMI >30kg/m2);sedentary lifestyle Sleep patterns: has interrupted sleep, gets up 2 times nightly to void and sleeps 4-6 hours nightly.  No naps Discussed to turn off electronics, go to sleep and wake up at same time, no caffeine several hours before bedtime.  Home Safety/Smoke Alarms: Feels safe in home. Smoke alarms in place.    Living environment; residence and Firearm Safety: 2-story house, no firearms lives with husband. Discussed placing safety bars in bathroom for assistance and safety Seat Belt Safety/Bike Helmet: Wears seat belt.   Counseling:   Eye Exam- Last, 9/17, goes yearly  Dental- Last, within last year resource given Female:   Pap-  Last 03/1912, negative  Mammo- Last 07/12/13,BI-RADS CATEGORY 1: Negative       Dexa scan-   N/A     CCS- Last 09/26/07, external hemorrhoids otherwise negative      Objective:     Vitals: BP (!) 144/78   Pulse (!) 59   Resp (!) 21   Ht 5\' 5"  (1.651 m)   Wt (!) 337 lb (152.9 kg)   LMP 09/09/2009   SpO2 100%   BMI 56.08 kg/m   Body mass index is 56.08 kg/m.   Tobacco History  Smoking Status  . Former Smoker  . Packs/day: 0.50  . Years: 20.00  . Types: Cigarettes  . Quit date: 09/10/1998  Smokeless Tobacco  . Never Used     Counseling given: Not Answered   Past Medical History:  Diagnosis Date  . Abnormal EKG 06/2003   History of inverted T waves V1-V3. Normal 2D echo (07/23/2003): LVEF 65%.  . Anemia    BL Hgb 11-12. Ferritin 14 - low normal (08/2007). Colonoscopy 2009 - external hemorrhoids (excellent prep). Last anemia panel (12/2010) - Iron  24, TIBC 269,  B12  316, Folate 11.9, Ferritin 73.  . Asthma   . Degenerative joint  disease    BL knees (L>R), lumbar spine. Followed by Sports Medicine, Dr. Jennette KettleNeal.  . History of multiple pulmonary nodules    Incidental finding: CT Abd/ Pelvis (04/2010) - Several small lower lobe lung nodules, including one pure ground-glass pulmonary nodule measuring 8 mm in the left lower lobe.  Recommend follow-up chest CT (IV contrast preferred) in 6 months to document stability. //  CT Abd/ Pelvis (07/2010) -  3 mm RLL and  8 mm LLL nodule stable.  Other nodules unchanged, likely benign.  . Hyperlipidemia   . Hypertension   . Incarcerated ventral hernia 04/2010   Noted on CT Abd/ pelvis (04/2010). Patient now s/p ventral hernia repair by Dr. Gerrit FriendsGerkin (12/2010)  . 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 Lower extremity edema    Chronic. 2D echo (2005) - EF 65%.  . Obesity   . Seasonal allergies   . Wears dentures   . Wears glasses    Past Surgical History:  Procedure Laterality Date  . FOOT SURGERY  2004    left  . INCISION AND DRAINAGE ABSCESS ANAL  07/2008   I&D and debridgement of anorectal abscess  . INCISIONAL HERNIA REPAIR  12/2010   Repair of incarcerated ventral incisional hernia with Ethicon mesh patch - performed by Dr. Gerrit FriendsGerkin.   . INGUINAL HERNIA REPAIR    .  JOINT REPLACEMENT Left 2013   left knee  . TOTAL KNEE ARTHROPLASTY  06/16/2011   Left TOTAL KNEE ARTHROPLASTY;  Surgeon: Kennieth Rad;  Location: [facility] OR;  Service: Orthopedics;  Laterality: Left;  left total knee arthroplasty  . TUBAL LIGATION     Family History  Problem Relation Age of Onset  . Diabetes Mother   . Hypertension Mother   . Stroke Mother   . Dementia Father   . Heart disease Father   . Hypertension Sister   . Diabetes Brother   . Hypertension Brother   . Rashes / Skin problems Maternal Grandfather   . Heart attack Neg Hx    History  Sexual Activity  . Sexual activity: Yes  . Partners: Male    Outpatient Encounter Prescriptions as of 08/18/2016  Medication  Sig  . acetaminophen-codeine (TYLENOL #3) 300-30 MG tablet Take 1 tablet by mouth every 4 (four) hours as needed for moderate pain.  . Albuterol Sulfate (PROAIR RESPICLICK) 108 (90 Base) MCG/ACT AEPB Inhale 2 puffs into the lungs every 6 (six) hours as needed. Wheezing or difficulty breathing.  [name] lisinopril-hydrochlorothiazide (PRINZIDE,ZESTORETIC) 20-12.5 MG tablet Take 1 tablet by mouth daily.  . pantoprazole (PROTONIX) 40 MG tablet Take 1 tablet (40 mg total) by mouth daily.  . traMADol (ULTRAM) 50 MG tablet Take 1 tablet (50 mg total) by mouth every 6 (six) hours as needed.  . [DISCONTINUED] lisinopril-hydrochlorothiazide (ZESTORETIC) 20-12.5 MG per tablet Take 2 tablets by mouth daily.   No facility-administered encounter medications on file as of 08/18/2016.     Activities of Daily Living In your present state of health, do you have any difficulty performing the following activities: 08/18/2016 04/02/2016  Hearing? N N  Vision? N N  Difficulty concentrating or making decisions? N N  Walking or climbing stairs? Y Y  Dressing or bathing? Y N  Doing errands, shopping? N N  Preparing Food and eating ? N -  Using the Toilet? N -  In the past six months, have you accidently leaked urine? N -  Do you have problems with loss of bowel control? N -  Managing your Medications? N -  Managing your Finances? N -  Housekeeping or managing your Housekeeping? N -  Some recent data might be hidden    Patient Care Team: Myrlene Broker, MD as PCP - General (Internal Medicine)    Assessment:    Physical assessment deferred to PCP.  Exercise Activities and Dietary recommendations Current Exercise Habits: The patient does not participate in regular exercise at present, Exercise limited by: orthopedic condition(s)  Diet (meal preparation, eat out, water intake, caffeinated beverages, dairy products, fruits and vegetables): in general, an "unhealthy" diet Patient states she has recently gained  weight. She admits to skipping meals due to nausea and she states she is eating too many sweets. She drinks 5 bottles per day.   Discussed to eat small frequent meals and try not to skip meals. Advised to drink ensure as a supplement. Patient has an scheduled appoint next with Dr. Leone Payor GI. Discussed that patient inform Dr. Leone Payor regarding her nausea and her inability to eat as a result. Encouraged patient to eat HH diet.   Goals    . LDL CALC < 130    . Weight (lb) < 317 lb (143.8 kg)          Process of to the [facility] water aerobics      Fall Risk Fall Risk  08/18/2016 05/13/2014  07/22/2013 06/21/2013 04/09/2013  Falls in the past year? Yes No No No No  Number falls in past yr: 2 or more - - - -  Injury with Fall? No - - - -  Risk Factor Category  High Fall Risk - - - -  Risk for fall due to : History of fall(s);Impaired balance/gait;Impaired mobility - - - -  Risk for fall due to (comments): several falls within the last few months, patient states her knee gives ou without warning - - - -  Follow up Falls prevention discussed;Education provided - - - -   Depression Screen PHQ 2/9 Scores 08/18/2016 05/13/2014 07/22/2013 06/21/2013  PHQ - 2 Score 0 0 0 0     Cognitive Function       Ad8 score reviewed for issues:  Issues making decisions: no  Less interest in hobbies / activities:no  Repeats questions, stories (family complaining): no  Trouble using ordinary gadgets (microwave, computer, phone): no  Forgets the month or year: no  Mismanaging finances: no  Remembering appts:no  Daily problems with thinking and/or memory: no Ad8 score is= 0     Immunization History  Administered Date(s) Administered  . Influenza Split 03/29/2012  . Influenza,inj,Quad PF,36+ Mos 04/09/2013, 09/23/2014, 05/04/2015, 04/04/2016  . Pneumococcal Polysaccharide-23 03/29/2012, 04/04/2016  . Tdap 10/14/2010   Screening Tests Health Maintenance  Topic Date Due  . Hepatitis C Screening   [birth year]  . HIV Screening  [birth year]24[birth year]1972  . PAP SMEAR  03/30/2015  . MAMMOGRAM  07/13/2015  . COLONOSCOPY  09/25/2017  . TETANUS/TDAP  10/13/2020  . INFLUENZA VACCINE  Completed      Plan:     Start to eat heart healthy diet (full of fruits, vegetables, whole grains, lean protein, water--limit salt, fat, and sugar intake) and increase physical activity as tolerated.  Continue doing brain stimulating activities (puzzles, reading, adult coloring books, staying active) to keep memory sharp.   Supplement diet with ensure to get needed nutrition. Patient will discuss nausea issues with Dr.  Leone Payor during her schedule visit with him on 08/17/16 (today)  Instructed to go to the lab after her appointment with GI.   During the course of the visit the patient was educated and counseled about the following appropriate screening and preventive services:   Vaccines to include Pneumoccal, Influenza, Hepatitis B, Td, Zostavax, HCV  Cardiovascular Disease  Colorectal cancer screening  Bone density screening  Diabetes screening  Glaucoma screening  Mammography/PAP  Nutrition counseling   Patient Instructions (the written plan) was given to the patient.   Wanda Plump, RN  08/18/2016

## 2016-08-17 NOTE — Progress Notes (Signed)
Pre visit review using our clinic review tool, if applicable. No additional management support is needed unless otherwise documented below in the visit note. 

## 2016-08-18 ENCOUNTER — Telehealth: Payer: Self-pay | Admitting: *Deleted

## 2016-08-18 ENCOUNTER — Other Ambulatory Visit (INDEPENDENT_AMBULATORY_CARE_PROVIDER_SITE_OTHER): Payer: Medicare Other

## 2016-08-18 ENCOUNTER — Encounter: Payer: Self-pay | Admitting: Internal Medicine

## 2016-08-18 ENCOUNTER — Ambulatory Visit (INDEPENDENT_AMBULATORY_CARE_PROVIDER_SITE_OTHER): Payer: Medicare Other | Admitting: Internal Medicine

## 2016-08-18 ENCOUNTER — Ambulatory Visit (INDEPENDENT_AMBULATORY_CARE_PROVIDER_SITE_OTHER): Payer: Medicare Other | Admitting: *Deleted

## 2016-08-18 ENCOUNTER — Other Ambulatory Visit: Payer: Medicare Other

## 2016-08-18 VITALS — BP 142/88 | HR 92 | Ht 65.0 in | Wt 336.4 lb

## 2016-08-18 VITALS — BP 144/78 | HR 59 | Resp 21 | Ht 65.0 in | Wt 337.0 lb

## 2016-08-18 DIAGNOSIS — Z114 Encounter for screening for human immunodeficiency virus [HIV]: Secondary | ICD-10-CM

## 2016-08-18 DIAGNOSIS — Z1231 Encounter for screening mammogram for malignant neoplasm of breast: Secondary | ICD-10-CM | POA: Diagnosis not present

## 2016-08-18 DIAGNOSIS — Z7289 Other problems related to lifestyle: Secondary | ICD-10-CM

## 2016-08-18 DIAGNOSIS — E538 Deficiency of other specified B group vitamins: Secondary | ICD-10-CM | POA: Diagnosis not present

## 2016-08-18 DIAGNOSIS — R110 Nausea: Secondary | ICD-10-CM | POA: Diagnosis not present

## 2016-08-18 DIAGNOSIS — R1084 Generalized abdominal pain: Secondary | ICD-10-CM | POA: Diagnosis not present

## 2016-08-18 DIAGNOSIS — Z0000 Encounter for general adult medical examination without abnormal findings: Secondary | ICD-10-CM | POA: Diagnosis not present

## 2016-08-18 DIAGNOSIS — Z1239 Encounter for other screening for malignant neoplasm of breast: Secondary | ICD-10-CM

## 2016-08-18 LAB — HEPATITIS C ANTIBODY: HCV Ab: NEGATIVE

## 2016-08-18 LAB — VITAMIN B12: Vitamin B-12: 180 pg/mL — ABNORMAL LOW (ref 211–911)

## 2016-08-18 MED ORDER — GLYCOPYRROLATE 2 MG PO TABS: 2.0000 mg | ORAL_TABLET | Freq: Two times a day (BID) | ORAL | 2 refills | Status: DC

## 2016-08-18 NOTE — Telephone Encounter (Signed)
Called patient to provide OBGYN clinics for pap smear screening. Patient has multiple questions regarding her health care. Patient has not been seen since 04/2015, scheduled a CPE with PCP.

## 2016-08-18 NOTE — Progress Notes (Signed)
B12 is still low - it is possible hat this is related to her sxs in my experience She was told to take oralsupps in past and did fr a while  1) I recommend she get some injections to boost this in primary care and also take oral 2) F/U me 2 mos

## 2016-08-18 NOTE — Progress Notes (Signed)
Medical screening examination/treatment/procedure(s) were performed by non-physician practitioner and as supervising physician I was immediately available for consultation/collaboration. I agree with above. Elizabeth A Crawford, MD 

## 2016-08-18 NOTE — Patient Instructions (Addendum)
Start to eat heart healthy diet (full of fruits, vegetables, whole grains, lean protein, water--limit salt, fat, and sugar intake) and increase physical activity as tolerated.  Continue doing brain stimulating activities (puzzles, reading, adult coloring books, staying active) to keep memory sharp.   Supplement diet with ensure to get needed nutrition Fat and Cholesterol Restricted Diet High levels of fat and cholesterol in your blood may lead to various health problems, such as diseases of the heart, blood vessels, gallbladder, liver, and pancreas. Fats are concentrated sources of energy that come in various forms. Certain types of fat, including saturated fat, may be harmful in excess. Cholesterol is a substance needed by your body in small amounts. Your body makes all the cholesterol it needs. Excess cholesterol comes from the food you eat. When you have high levels of cholesterol and saturated fat in your blood, health problems can develop because the excess fat and cholesterol will gather along the walls of your blood vessels, causing them to narrow. Choosing the right foods will help you control your intake of fat and cholesterol. This will help keep the levels of these substances in your blood within normal limits and reduce your risk of disease. What is my plan? Your health care provider recommends that you:  Limit your fat intake to ______% or less of your total calories per day.  Limit the amount of cholesterol in your diet to less than _________mg per day.  Eat 20-30 grams of fiber each day. What types of fat should I choose?  Choose healthy fats more often. Choose monounsaturated and polyunsaturated fats, such as olive and canola oil, flaxseeds, walnuts, almonds, and seeds.  Eat more omega-3 fats. Good choices include salmon, mackerel, sardines, tuna, flaxseed oil, and ground flaxseeds. Aim to eat fish at least two times a week.  Limit saturated fats. Saturated fats are primarily  found in animal products, such as meats, butter, and cream. Plant sources of saturated fats include palm oil, palm kernel oil, and coconut oil.  Avoid foods with partially hydrogenated oils in them. These contain trans fats. Examples of foods that contain trans fats are stick margarine, some tub margarines, cookies, crackers, and other baked goods. What general guidelines do I need to follow? These guidelines for healthy eating will help you control your intake of fat and cholesterol:  Check food labels carefully to identify foods with trans fats or high amounts of saturated fat.  Fill one half of your plate with vegetables and green salads.  Fill one fourth of your plate with whole grains. Look for the word "whole" as the first word in the ingredient list.  Fill one fourth of your plate with lean protein foods.  Limit fruit to two servings a day. Choose fruit instead of juice.  Eat more foods that contain fiber, such as apples, broccoli, carrots, beans, peas, and barley.  Eat more home-cooked food and less restaurant, buffet, and fast food.  Limit or avoid alcohol.  Limit foods high in starch and sugar.  Limit fried foods.  Cook foods using methods other than frying. Baking, boiling, grilling, and broiling are all great options.  Lose weight if you are overweight. Losing just 5-10% of your initial body weight can help your overall health and prevent diseases such as diabetes and heart disease. What foods can I eat? Grains  Whole grains, such as whole wheat or whole grain breads, crackers, cereals, and pasta. Unsweetened oatmeal, bulgur, barley, quinoa, or brown rice. Corn or whole wheat flour  tortillas. Vegetables  Fresh or frozen vegetables (raw, steamed, roasted, or grilled). Green salads. Fruits  All fresh, canned (in natural juice), or frozen fruits. Meats and other protein foods  Ground beef (85% or leaner), grass-fed beef, or beef trimmed of fat. Skinless chicken or  [place]. Ground chicken or [place]. Pork trimmed of fat. All fish and seafood. Eggs. Dried beans, peas, or lentils. Unsalted nuts or seeds. Unsalted canned or dry beans. Dairy  Low-fat dairy products, such as skim or 1% milk, 2% or reduced-fat cheeses, low-fat ricotta or cottage cheese, or plain low-fat yo Fats and oils  Tub margarines without trans fats. Light or reduced-fat mayonnaise and salad dressings. Avocado. Olive, canola, sesame, or safflower oils. Natural peanut or almond butter (choose ones without added sugar and oil). The items listed above may not be a complete list of recommended foods or beverages. Contact your dietitian for more options.  Foods to avoid Grains  White bread. White pasta. White rice. Cornbread. Bagels, pastries, and croissants. Crackers that contain trans fat. Vegetables  White potatoes. Corn. Creamed or fried vegetables. Vegetables in a cheese sauce. Fruits  Dried fruits. Canned fruit in light or heavy syrup. Fruit juice. Meats and other protein foods  Fatty cuts of meat. Ribs, chicken wings, bacon, sausage, bologna, salami, chitterlings, fatback, hot dogs, bratwurst, and packaged luncheon meats. Liver and organ meats. Dairy  Whole or 2% milk, cream, half-and-half, and cream cheese. Whole milk cheeses. Whole-fat or sweetened yogurt. Full-fat cheeses. Nondairy creamers and whipped toppings. Processed cheese, cheese spreads, or cheese curds. Beverages  Alcohol. Sweetened drinks (such as sodas, lemonade, and fruit drinks or punches). Fats and oils  Butter, stick margarine, lard, shortening, ghee, or bacon fat. Coconut, palm kernel, or palm oils. Sweets and desserts  Corn syrup, sugars, honey, and molasses. Candy. Jam and jelly. Syrup. Sweetened cereals. Cookies, pies, cakes, donuts, muffins, and ice cream. The items listed above may not be a complete list of foods and beverages to avoid. Contact your dietitian for more information.  This information is  not intended to replace advice given to you by your health care provider. Make sure you discuss any questions you have with your health care provider. Document Released: 06/27/2005 Document Revised: 07/18/2014 Document Reviewed: 09/25/2013 Elsevier Interactive Patient Education  2017 [place] and Cholesterol Restricted Diet Introduction Getting too much fat and cholesterol in your diet may cause health problems. Following this diet helps keep your fat and cholesterol at normal levels. This can keep you from getting sick. What types of fat should I choose?  Choose monosaturated and polyunsaturated fats. These are found in foods such as olive oil, canola oil, flaxseeds, walnuts, almonds, and seeds.  Eat more omega-3 fats. Good choices include salmon, mackerel, sardines, tuna, flaxseed oil, and ground flaxseeds.  Limit saturated fats. These are in animal products such as meats, butter, and cream. They can also be in plant products such as palm oil, palm kernel oil, and coconut oil.  Avoid foods with partially hydrogenated oils in them. These contain trans fats. Examples of foods that have trans fats are stick margarine, some tub margarines, cookies, crackers, and other baked goods. What general guidelines do I need to follow?  Check food labels. Look for the words "trans fat" and "saturated fat."  When preparing a meal:  Fill half of your plate with vegetables and green salads.  Fill one fourth of your plate with whole grains. Look for the word "whole" as the first word  in the ingredient list.  Fill one fourth of your plate with lean protein foods.  Eat more foods that have fiber, like apples, carrots, beans, peas, and barley.  Eat more home-cooked foods. Eat less at restaurants and buffets.  Limit or avoid alcohol.  Limit foods high in starch and sugar.  Limit fried foods.  Cook foods without frying them. Baking, boiling, grilling, and broiling are all great  options.  Lose weight if you are overweight. Losing even a small amount of weight can help your overall health. It can also help prevent diseases such as diabetes and heart disease. What foods can I eat? Grains  Whole grains, such as whole wheat or whole grain breads, crackers, cereals, and pasta. Unsweetened oatmeal, bulgur, barley, quinoa, or brown rice. Corn or whole wheat flour tortillas. Vegetables  Fresh or frozen vegetables (raw, steamed, roasted, or grilled). Green salads. Fruits  All fresh, canned (in natural juice), or frozen fruits. Meat and Other Protein Products  Ground beef (85% or leaner), grass-fed beef, or beef trimmed of fat. Skinless chicken or [place]. Ground chicken or [place]. Pork trimmed of fat. All fish and seafood. Eggs. Dried beans, peas, or lentils. Unsalted nuts or seeds. Unsalted canned or dry beans. Dairy  Low-fat dairy products, such as skim or 1% milk, 2% or reduced-fat cheeses, low-fat ricotta or cottage cheese, or plain low-fat yogurt. Fats and Oils  Tub margarines without trans fats. Light or reduced-fat mayonnaise and salad dressings. Avocado. Olive, canola, sesame, or safflower oils. Natural peanut or almond butter (choose ones without added sugar and oil). The items listed above may not be a complete list of recommended foods or beverages. Contact your dietitian for more options.  What foods are not recommended? Grains  White bread. White pasta. White rice. Cornbread. Bagels, pastries, and croissants. Crackers that contain trans fat. Vegetables  White potatoes. Corn. Creamed or fried vegetables. Vegetables in a cheese sauce. Fruits  Dried fruits. Canned fruit in light or heavy syrup. Fruit juice. Meat and Other Protein Products  Fatty cuts of meat. Ribs, chicken wings, bacon, sausage, bologna, salami, chitterlings, fatback, hot dogs, bratwurst, and packaged luncheon meats. Liver and organ meats. Dairy  Whole or 2% milk, cream, half-and-half, and cream  cheese. Whole milk cheeses. Whole-fat or sweetened yogurt. Full-fat cheeses. Nondairy creamers and whipped toppings. Processed cheese, cheese spreads, or cheese curds. Sweets and Desserts  Corn syrup, sugars, honey, and molasses. Candy. Jam and jelly. Syrup. Sweetened cereals. Cookies, pies, cakes, donuts, muffins, and ice cream. Fats and Oils  Butter, stick margarine, lard, shortening, ghee, or bacon fat. Coconut, palm kernel, or palm oils. Beverages  Alcohol. Sweetened drinks (such as sodas, lemonade, and fruit drinks or punches). The items listed above may not be a complete list of foods and beverages to avoid. Contact your dietitian for more information.  This information is not intended to replace advice given to you by your health care provider. Make sure you discuss any questions you have with your health care provider. Document Released: 12/27/2011 Document Revised: 03/03/2016 Document Reviewed: 09/26/2013  2017 Elsevier   Fall Prevention in the Home Introduction Falls can cause injuries. They can happen to people of all ages. There are many things you can do to make your home safe and to help prevent falls. What can I do on the outside of my home?  Regularly fix the edges of walkways and driveways and fix any cracks.  Remove anything that might make you trip as you walk through  a door, such as a raised step or threshold.  Trim any bushes or trees on the path to your home.  Use bright outdoor lighting.  Clear any walking paths of anything that might make someone trip, such as rocks or tools.  Regularly check to see if handrails are loose or broken. Make sure that both sides of any steps have handrails.  Any raised decks and porches should have guardrails on the edges.  Have any leaves, snow, or ice cleared regularly.  Use sand or salt on walking paths during winter.  Clean up any spills in your garage right away. This includes oil or grease spills. What can I do in the  bathroom?  Use night lights.  Install grab bars by the toilet and in the tub and shower. Do not use towel bars as grab bars.  Use non-skid mats or decals in the tub or shower.  If you need to sit down in the shower, use a plastic, non-slip stool.  Keep the floor dry. Clean up any water that spills on the floor as soon as it happens.  Remove soap buildup in the tub or shower regularly.  Attach bath mats securely with double-sided non-slip rug tape.  Do not have throw rugs and other things on the floor that can make you trip. What can I do in the bedroom?  Use night lights.  Make sure that you have a light by your bed that is easy to reach.  Do not use any sheets or blankets that are too big for your bed. They should not hang down onto the floor.  Have a firm chair that has side arms. You can use this for support while you get dressed.  Do not have throw rugs and other things on the floor that can make you trip. What can I do in the kitchen?  Clean up any spills right away.  Avoid walking on wet floors.  Keep items that you use a lot in easy-to-reach places.  If you need to reach something above you, use a strong step stool that has a grab bar.  Keep electrical cords out of the way.  Do not use floor polish or wax that makes floors slippery. If you must use wax, use non-skid floor wax.  Do not have throw rugs and other things on the floor that can make you trip. What can I do with my stairs?  Do not leave any items on the stairs.  Make sure that there are handrails on both sides of the stairs and use them. Fix handrails that are broken or loose. Make sure that handrails are as long as the stairways.  Check any carpeting to make sure that it is firmly attached to the stairs. Fix any carpet that is loose or worn.  Avoid having throw rugs at the top or bottom of the stairs. If you do have throw rugs, attach them to the floor with carpet tape.  Make sure that you have a  light switch at the top of the stairs and the bottom of the stairs. If you do not have them, ask someone to add them for you. What else can I do to help prevent falls?  Wear shoes that:  Do not have high heels.  Have rubber bottoms.  Are comfortable and fit you well.  Are closed at the toe. Do not wear sandals.  If you use a stepladder:  Make sure that it is fully opened. Do not climb a  closed stepladder.  Make sure that both sides of the stepladder are locked into place.  Ask someone to hold it for you, if possible.  Clearly mark and make sure that you can see:  Any grab bars or handrails.  First and last steps.  Where the edge of each step is.  Use tools that help you move around (mobility aids) if they are needed. These include:  Canes.  Walkers.  Scooters.  Crutches.  Turn on the lights when you go into a dark area. Replace any light bulbs as soon as they burn out.  Set up your furniture so you have a clear path. Avoid moving your furniture around.  If any of your floors are uneven, fix them.  If there are any pets around you, be aware of where they are.  Review your medicines with your doctor. Some medicines can make you feel dizzy. This can increase your chance of falling. Ask your doctor what other things that you can do to help prevent falls. This information is not intended to replace advice given to you by your health care provider. Make sure you discuss any questions you have with your health care provider. Document Released: 04/23/2009 Document Revised: 12/03/2015 Document Reviewed: 08/01/2014  2017 Elsevier  Health Maintenance, Female Introduction Adopting a healthy lifestyle and getting preventive care can go a long way to promote health and wellness. Talk with your health care provider about what schedule of regular examinations is right for you. This is a good chance for you to check in with your provider about disease prevention and staying  healthy. In between checkups, there are plenty of things you can do on your own. Experts have done a lot of research about which lifestyle changes and preventive measures are most likely to keep you healthy. Ask your health care provider for more information. Weight and diet Eat a healthy diet  Be sure to include plenty of vegetables, fruits, low-fat dairy products, and lean protein.  Do not eat a lot of foods high in solid fats, added sugars, or salt.  Get regular exercise. This is one of the most important things you can do for your health.  Most adults should exercise for at least 150 minutes each week. The exercise should increase your heart rate and make you sweat (moderate-intensity exercise).  Most adults should also do strengthening exercises at least twice a week. This is in addition to the moderate-intensity exercise. Maintain a healthy weight  Body mass index (BMI) is a measurement that can be used to identify possible weight problems. It estimates body fat based on height and weight. Your health care provider can help determine your BMI and help you achieve or maintain a healthy weight.  For females 25 years of age and older:  A BMI below 18.5 is considered underweight.  A BMI of 18.5 to 24.9 is normal.  A BMI of 25 to 29.9 is considered overweight.  A BMI of 30 and above is considered obese. Watch levels of cholesterol and blood lipids  You should start having your blood tested for lipids and cholesterol at 60 years of age, then have this test every 5 years.  You may need to have your cholesterol levels checked more often if:  Your lipid or cholesterol levels are high.  You are older than 60 years of age.  You are at high risk for heart disease. Cancer screening Lung Cancer  Lung cancer screening is recommended for adults 76-36 years old who  are at high risk for lung cancer because of a history of smoking.  A yearly low-dose CT scan of the lungs is recommended  for people who:  Currently smoke.  Have quit within the past 15 years.  Have at least a 30-pack-year history of smoking. A pack year is smoking an average of one pack of cigarettes a day for 1 year.  Yearly screening should continue until it has been 15 years since you quit.  Yearly screening should stop if you develop a health problem that would prevent you from having lung cancer treatment. Breast Cancer  Practice breast self-awareness. This means understanding how your breasts normally appear and feel.  It also means doing regular breast self-exams. Let your health care provider know about any changes, no matter how small.  If you are in your 20s or 30s, you should have a clinical breast exam (CBE) by a health care provider every 1-3 years as part of a regular health exam.  If you are 22 or older, have a CBE every year. Also consider having a breast X-ray (mammogram) every year.  If you have a family history of breast cancer, talk to your health care provider about genetic screening.  If you are at high risk for breast cancer, talk to your health care provider about having an MRI and a mammogram every year.  Breast cancer gene (BRCA) assessment is recommended for women who have family members with BRCA-related cancers. BRCA-related cancers include:  Breast.  Ovarian.  Tubal.  Peritoneal cancers.  Results of the assessment will determine the need for genetic counseling and BRCA1 and BRCA2 testing. Cervical Cancer  Your health care provider may recommend that you be screened regularly for cancer of the pelvic organs (ovaries, uterus, and vagina). This screening involves a pelvic examination, including checking for microscopic changes to the surface of your cervix (Pap test). You may be encouraged to have this screening done every 3 years, beginning at age 53.  For women ages 20-65, health care providers may recommend pelvic exams and Pap testing every 3 years, or they may  recommend the Pap and pelvic exam, combined with testing for human papilloma virus (HPV), every 5 years. Some types of HPV increase your risk of cervical cancer. Testing for HPV may also be done on women of any age with unclear Pap test results.  Other health care providers may not recommend any screening for nonpregnant women who are considered low risk for pelvic cancer and who do not have symptoms. Ask your health care provider if a screening pelvic exam is right for you.  If you have had past treatment for cervical cancer or a condition that could lead to cancer, you need Pap tests and screening for cancer for at least 20 years after your treatment. If Pap tests have been discontinued, your risk factors (such as having a new sexual partner) need to be reassessed to determine if screening should resume. Some women have medical problems that increase the chance of getting cervical cancer. In these cases, your health care provider may recommend more frequent screening and Pap tests. Colorectal Cancer  This type of cancer can be detected and often prevented.  Routine colorectal cancer screening usually begins at 60 years of age and continues through 60 years of age.  Your health care provider may recommend screening at an earlier age if you have risk factors for colon cancer.  Your health care provider may also recommend using home test kits to check for  hidden blood in the stool.  A small camera at the end of a tube can be used to examine your colon directly (sigmoidoscopy or colonoscopy). This is done to check for the earliest forms of colorectal cancer.  Routine screening usually begins at age 68.  Direct examination of the colon should be repeated every 5-10 years through 60 years of age. However, you may need to be screened more often if early forms of precancerous polyps or small growths are found. Skin Cancer  Check your skin from head to toe regularly.  Tell your health care provider  about any new moles or changes in moles, especially if there is a change in a mole's shape or color.  Also tell your health care provider if you have a mole that is larger than the size of a pencil eraser.  Always use sunscreen. Apply sunscreen liberally and repeatedly throughout the day.  Protect yourself by wearing long sleeves, pants, a wide-brimmed hat, and sunglasses whenever you are outside. Heart disease, diabetes, and high blood pressure  High blood pressure causes heart disease and increases the risk of stroke. High blood pressure is more likely to develop in:  People who have blood pressure in the high end of the normal range (130-139/85-89 mm Hg).  People who are overweight or obese.  People who are African American.  If you are 83-60 years of age, have your blood pressure checked every 3-5 years. If you are 38 years of age or older, have your blood pressure checked every year. You should have your blood pressure measured twice-once when you are at a hospital or clinic, and once when you are not at a hospital or clinic. Record the average of the two measurements. To check your blood pressure when you are not at a hospital or clinic, you can use:  An automated blood pressure machine at a pharmacy.  A home blood pressure monitor.  If you are between 73 years and 51 years old, ask your health care provider if you should take aspirin to prevent strokes.  Have regular diabetes screenings. This involves taking a blood sample to check your fasting blood sugar level.  If you are at a normal weight and have a low risk for diabetes, have this test once every three years after 60 years of age.  If you are overweight and have a high risk for diabetes, consider being tested at a younger age or more often. Preventing infection Hepatitis B  If you have a higher risk for hepatitis B, you should be screened for this virus. You are considered at high risk for hepatitis B if:  You were  born in a country where hepatitis B is common. Ask your health care provider which countries are considered high risk.  Your parents were born in a high-risk country, and you have not been immunized against hepatitis B (hepatitis B vaccine).  You have HIV or AIDS.  You use needles to inject street drugs.  You live with someone who has hepatitis B.  You have had sex with someone who has hepatitis B.  You get hemodialysis treatment.  You take certain medicines for conditions, including cancer, organ transplantation, and autoimmune conditions. Hepatitis C  Blood testing is recommended for:  Everyone born from [birth year] through 1964.  Anyone with known risk factors for hepatitis C. Sexually transmitted infections (STIs)  You should be screened for sexually transmitted infections (STIs) including gonorrhea and chlamydia if:  You are sexually active and are younger  than 60 years of age.  You are older than 61 years of age and your health care provider tells you that you are at risk for this type of infection.  Your sexual activity has changed since you were last screened and you are at an increased risk for chlamydia or gonorrhea. Ask your health care provider if you are at risk.  If you do not have HIV, but are at risk, it may be recommended that you take a prescription medicine daily to prevent HIV infection. This is called pre-exposure prophylaxis (PrEP). You are considered at risk if:  You are sexually active and do not regularly use condoms or know the HIV status of your partner(s).  You take drugs by injection.  You are sexually active with a partner who has HIV. Talk with your health care provider about whether you are at high risk of being infected with HIV. If you choose to begin PrEP, you should first be tested for HIV. You should then be tested every 3 months for as long as you are taking PrEP. Pregnancy  If you are premenopausal and you may become pregnant, ask your health  care provider about preconception counseling.  If you may become pregnant, take 400 to 800 micrograms (mcg) of folic acid every day.  If you want to prevent pregnancy, talk to your health care provider about birth control (contraception). Osteoporosis and menopause  Osteoporosis is a disease in which the bones lose minerals and strength with aging. This can result in serious bone fractures. Your risk for osteoporosis can be identified using a bone density scan.  If you are 60 years of age or older, or if you are at risk for osteoporosis and fractures, ask your health care provider if you should be screened.  Ask your health care provider whether you should take a calcium or vitamin D supplement to lower your risk for osteoporosis.  Menopause may have certain physical symptoms and risks.  Hormone replacement therapy may reduce some of these symptoms and risks. Talk to your health care provider about whether hormone replacement therapy is right for you. Follow these instructions at home:  Schedule regular health, dental, and eye exams.  Stay current with your immunizations.  Do not use any tobacco products including cigarettes, chewing tobacco, or electronic cigarettes.  If you are pregnant, do not drink alcohol.  If you are breastfeeding, limit how much and how often you drink alcohol.  Limit alcohol intake to no more than 1 drink per day for nonpregnant women. One drink equals 12 ounces of beer, 5 ounces of wine, or 1 ounces of hard liquor.  Do not use street drugs.  Do not share needles.  Ask your health care provider for help if you need support or information about quitting drugs.  Tell your health care provider if you often feel depressed.  Tell your health care provider if you have ever been abused or do not feel safe at home. This information is not intended to replace advice given to you by your health care provider. Make sure you discuss any questions you have with  your health care provider. Document Released: 01/10/2011 Document Revised: 12/03/2015 Document Reviewed: 03/31/2015  2017 Elsevier

## 2016-08-18 NOTE — Progress Notes (Signed)
[name] 60 y.o. [birth year] [id number]  Assessment & Plan:   Encounter Diagnoses  Name Primary?  . Generalized abdominal pain of unknown etiology Yes  . Nausea without vomiting   . B12 deficiency    Cause of sxs not clear - ? Functional GI problems. Started glycopyrrolate for possible symptom alleviation. Follow up to be determined. Consider another CT scan 3 months after the last scan if symptoms persist given mesenteric haziness, ? Adenitis.  Ordered B12 due to previous deficiency ? repeleted.  -   Lab Results  Component Value Date   VITAMINB12 180 (L) 08/18/2016   This returned low - ? If contributing to sxs any - it is possible in my experience - will supplement and RTC 2 mos Also consider that she could have a malabsorption problem. Pernicious anemia also possible.  Advised to discontinue pantoprazole since patient claimed it wasn't providing any benefit.   Ivar Drape PA-S  I have seen the patient with [name] and she has served as a Neurosurgeon. Iva Boop, MD, [facility]    Subjective:   Chief Complaint: abdominal pain  HPI 61 year old obese female presents with generalized abdominal pain for 4 months. Pain has improved in severity over the last couple of months, but she is experiencing constant tenderness with intermittent, fluctuating pain episodes. Episode duration is variable. Pain has woken her up at night. Coughing and eating worsen symptoms. Bowel movements and acetaminophen-codeine help alleviate symptoms. Also complains of nausea, fluctuations in weight, loss of appetite, increased flatulence, low grade fever, and softer stools with mild fecal leakage. Has 2-3 bowel movements daily. Denies constipation, hematochezia, melena, vomiting, or saddle anesthesia. Last colonoscopy was in 2009 and remarkable only for external hemorrhoids.   Seen in ED 06/2016 - had CT as below   CT ABDOMEN AND PELVIS WITH CONTRAST   COMPARISON:  06/10/2016, CT  05/19/2014   IMPRESSION: 1. No CT evidence for acute intra-abdominal or pelvic pathology. 2. Mild hazy infiltration of the mesenteric root with several prominent mesenteric lymph nodes, possibly related to adenitis. 3. Stable 7 mm nodule in the left lower lobe, benign.     Allergies  Allergen Reactions  . Ketorolac Tromethamine Shortness Of Breath and Palpitations  . Atrovent [Ipratropium] Hives and Rash  . Latex Other (See Comments)    Powdered. Confirm type of reaction with patient.   Current Meds  Medication Sig  . acetaminophen-codeine (TYLENOL #3) 300-30 MG tablet Take 1 tablet by mouth every 4 (four) hours as needed for moderate pain.  . Albuterol Sulfate (PROAIR RESPICLICK) 108 (90 Base) MCG/ACT AEPB Inhale 2 puffs into the lungs every 6 (six) hours as needed. Wheezing or difficulty breathing.  [name] lisinopril-hydrochlorothiazide (PRINZIDE,ZESTORETIC) 20-12.5 MG tablet Take 1 tablet by mouth daily.  . pantoprazole (PROTONIX) 40 MG tablet Take 1 tablet (40 mg total) by mouth daily.  . traMADol (ULTRAM) 50 MG tablet Take 1 tablet (50 mg total) by mouth every 6 (six) hours as needed.   Past Medical History:  Diagnosis Date  . Abnormal EKG 06/2003   History of inverted T waves V1-V3. Normal 2D echo (07/23/2003): LVEF 65%.  . Anemia    BL Hgb 11-12. Ferritin 14 - low normal (08/2007). Colonoscopy 2009 - external hemorrhoids (excellent prep). Last anemia panel (12/2010) - Iron  24, TIBC 269,  B12  316, Folate 11.9, Ferritin 73.  . Asthma   . Degenerative joint disease    BL knees (L>R), lumbar spine. Followed by  Sports Medicine, Dr. Jennette Kettle.  . History of multiple pulmonary nodules    Incidental finding: CT Abd/ Pelvis (04/2010) - Several small lower lobe lung nodules, including one pure ground-glass pulmonary nodule measuring 8 mm in the left lower lobe.  Recommend follow-up chest CT (IV contrast preferred) in 6 months to document stability. //  CT Abd/ Pelvis (07/2010) -  3 mm RLL  and  8 mm LLL nodule stable.  Other nodules unchanged, likely benign.  . Hyperlipidemia   . Hypertension   . Incarcerated ventral hernia 04/2010   Noted on CT Abd/ pelvis (04/2010). Patient now s/p ventral hernia repair by Dr. Gerrit Friends (12/2010)  . 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 Lower extremity edema    Chronic. 2D echo (2005) - EF 65%.  . Obesity   . Seasonal allergies   . Wears dentures   . Wears glasses    Past Surgical History:  Procedure Laterality Date  . COLONOSCOPY  2009  . FOOT SURGERY  2004    left  . INCISION AND DRAINAGE ABSCESS ANAL  07/2008   I&D and debridgement of anorectal abscess  . INCISIONAL HERNIA REPAIR  12/2010   Repair of incarcerated ventral incisional hernia with Ethicon mesh patch - performed by Dr. Gerrit Friends.   . INGUINAL HERNIA REPAIR    . JOINT REPLACEMENT Left 2013   left knee  . TOTAL KNEE ARTHROPLASTY  06/16/2011   Left TOTAL KNEE ARTHROPLASTY;  Surgeon: Kennieth Rad;  Location: [facility] OR;  Service: Orthopedics;  Laterality: Left;  left total knee arthroplasty  . TUBAL LIGATION     Social History   Social History  . Marital status: Married    Spouse name: N/A  . Number of children: 3  . Years of education: N/A   Social History Main Topics  . Smoking status: Former Smoker    Packs/day: 0.50    Years: 20.00    Types: Cigarettes    Quit date: 09/10/1998  . Smokeless tobacco: Never Used  . Alcohol use No  . Drug use: No  . Sexual activity: Yes    Partners: Male   Other Topics Concern  . None   Social History Narrative  . None   Family History  Problem Relation Age of Onset  . Diabetes Mother   . Hypertension Mother   . Stroke Mother   . Dementia Father   . Heart disease Father   . Hypertension Sister   . Diabetes Brother   . Hypertension Brother   . Rashes / Skin problems Maternal Grandfather   . Heart attack Neg Hx        Review of Systems As above, all others negative  Objective:    Physical Exam @BP  (!) 142/88 (BP Location: Left Arm, Patient Position: Sitting, Cuff Size: Large)   Pulse 92   Ht 5\' 5"  (1.651 m)   Wt (!) 336 lb 6.4 oz (152.6 kg)   LMP 09/09/2009   BMI 55.98 kg/m @  General:  Well-developed, obese female in no acute distress Eyes:  anicteric Neck:   supple w/o thyromegaly or mass.  Lungs: Clear to auscultation bilaterally. Heart:   RRR, S1S2, no rubs, murmurs, gallops. Abdomen:  Periumbilical tenderness to palpation. Small umbilical hernia noted. Soft, non distended, and BS+.  Lymph:  no cervical or supraclavicular adenopathy. Extremities:   no edema, cyanosis or clubbing Skin   no rash noted on limited exam Neuro:  A&O x 3.  Psych:  appropriate mood and  Affect.   Data Reviewed: Notes, labs, imaging.

## 2016-08-18 NOTE — Patient Instructions (Signed)
  We have sent the following medications to your pharmacy for you to pick up at your convenience: Glycopyrrolate   Your physician has requested that you go to the basement for the following lab work before leaving today: B12    Stop pantoprazole when you run out.    I appreciate the opportunity to care for you. Stan Headarl Gessner, MD, [facility]

## 2016-08-19 LAB — HIV ANTIBODY (ROUTINE TESTING W REFLEX): HIV 1&2 Ab, 4th Generation: NONREACTIVE

## 2016-09-08 ENCOUNTER — Encounter (HOSPITAL_BASED_OUTPATIENT_CLINIC_OR_DEPARTMENT_OTHER): Payer: Self-pay

## 2016-09-08 ENCOUNTER — Ambulatory Visit (HOSPITAL_BASED_OUTPATIENT_CLINIC_OR_DEPARTMENT_OTHER)
Admission: RE | Admit: 2016-09-08 | Discharge: 2016-09-08 | Disposition: A | Discharge status: Home / Self Care | Payer: Medicare Other | Source: Ambulatory Visit | Attending: Internal Medicine | Admitting: Internal Medicine

## 2016-09-08 DIAGNOSIS — Z1239 Encounter for other screening for malignant neoplasm of breast: Secondary | ICD-10-CM

## 2016-09-08 DIAGNOSIS — Z1231 Encounter for screening mammogram for malignant neoplasm of breast: Secondary | ICD-10-CM | POA: Insufficient documentation

## 2016-09-19 ENCOUNTER — Encounter: Payer: Medicare Other | Admitting: Internal Medicine

## 2016-09-25 ENCOUNTER — Encounter (HOSPITAL_COMMUNITY): Payer: Self-pay | Admitting: Emergency Medicine

## 2016-09-25 ENCOUNTER — Emergency Department (HOSPITAL_COMMUNITY): Payer: Medicare Other

## 2016-09-25 ENCOUNTER — Emergency Department (HOSPITAL_COMMUNITY)
Admission: EM | Admit: 2016-09-25 | Discharge: 2016-09-25 | Disposition: A | Discharge status: Home / Self Care | Payer: Medicare Other | Attending: Emergency Medicine | Admitting: Emergency Medicine

## 2016-09-25 DIAGNOSIS — I10 Essential (primary) hypertension: Secondary | ICD-10-CM | POA: Diagnosis not present

## 2016-09-25 DIAGNOSIS — Z79899 Other long term (current) drug therapy: Secondary | ICD-10-CM | POA: Insufficient documentation

## 2016-09-25 DIAGNOSIS — Z96652 Presence of left artificial knee joint: Secondary | ICD-10-CM | POA: Diagnosis not present

## 2016-09-25 DIAGNOSIS — Z91040 Latex allergy status: Secondary | ICD-10-CM | POA: Diagnosis not present

## 2016-09-25 DIAGNOSIS — R05 Cough: Secondary | ICD-10-CM | POA: Diagnosis not present

## 2016-09-25 DIAGNOSIS — J4531 Mild persistent asthma with (acute) exacerbation: Secondary | ICD-10-CM

## 2016-09-25 DIAGNOSIS — Z87891 Personal history of nicotine dependence: Secondary | ICD-10-CM | POA: Diagnosis not present

## 2016-09-25 DIAGNOSIS — J4521 Mild intermittent asthma with (acute) exacerbation: Secondary | ICD-10-CM | POA: Diagnosis not present

## 2016-09-25 DIAGNOSIS — J45909 Unspecified asthma, uncomplicated: Secondary | ICD-10-CM | POA: Diagnosis not present

## 2016-09-25 DIAGNOSIS — R0602 Shortness of breath: Secondary | ICD-10-CM | POA: Diagnosis not present

## 2016-09-25 LAB — COMPREHENSIVE METABOLIC PANEL
ALT: 10 U/L — ABNORMAL LOW (ref 14–54)
AST: 13 U/L — ABNORMAL LOW (ref 15–41)
Albumin: 3.3 g/dL — ABNORMAL LOW (ref 3.5–5.0)
Alkaline Phosphatase: 100 U/L (ref 38–126)
Anion gap: 8 (ref 5–15)
BUN: 8 mg/dL (ref 6–20)
CO2: 23 mmol/L (ref 22–32)
Calcium: 9.1 mg/dL (ref 8.9–10.3)
Chloride: 107 mmol/L (ref 101–111)
Creatinine, Ser: 0.73 mg/dL (ref 0.44–1.00)
GFR calc Af Amer: >60 mL/min (ref >60)
GFR calc non Af Amer: >60 mL/min (ref >60)
Glucose, Bld: 120 mg/dL — ABNORMAL HIGH (ref 65–99)
Potassium: 3.7 mmol/L (ref 3.5–5.1)
Sodium: 138 mmol/L (ref 135–145)
Total Bilirubin: 0.5 mg/dL (ref 0.3–1.2)
Total Protein: 6.7 g/dL (ref 6.5–8.1)

## 2016-09-25 LAB — CBC WITH DIFFERENTIAL/PLATELET
Basophils Absolute: 0 10*3/uL (ref 0.0–0.1)
Basophils Relative: 1 %
Eosinophils Absolute: 0.3 10*3/uL (ref 0.0–0.7)
Eosinophils Relative: 5 %
HCT: 36.2 % (ref 36.0–46.0)
Hemoglobin: 11.3 g/dL — ABNORMAL LOW (ref 12.0–15.0)
Lymphocytes Relative: 36 %
Lymphs Abs: 2 10*3/uL (ref 0.7–4.0)
MCH: 27.6 pg (ref 26.0–34.0)
MCHC: 31.2 g/dL (ref 30.0–36.0)
MCV: 88.5 fL (ref 78.0–100.0)
Monocytes Absolute: 0.3 10*3/uL (ref 0.1–1.0)
Monocytes Relative: 6 %
Neutro Abs: 3 10*3/uL (ref 1.7–7.7)
Neutrophils Relative %: 52 %
Platelets: 319 10*3/uL (ref 150–400)
RBC: 4.09 MIL/uL (ref 3.87–5.11)
RDW: 13.9 % (ref 11.5–15.5)
WBC: 5.6 10*3/uL (ref 4.0–10.5)

## 2016-09-25 LAB — I-STAT CG4 LACTIC ACID, ED: Lactic Acid, Venous: 0.94 mmol/L (ref 0.5–1.9)

## 2016-09-25 MED ORDER — ALBUTEROL (5 MG/ML) CONTINUOUS INHALATION SOLN
10.0000 mg/h | INHALATION_SOLUTION | Freq: Once | RESPIRATORY_TRACT | Status: AC
  Administered 2016-09-25: 10 mg/h via RESPIRATORY_TRACT
  Filled 2016-09-25: qty 20

## 2016-09-25 MED ORDER — ALBUTEROL SULFATE (2.5 MG/3ML) 0.083% IN NEBU
INHALATION_SOLUTION | RESPIRATORY_TRACT | Status: AC
  Filled 2016-09-25: qty 6

## 2016-09-25 MED ORDER — OPTICHAMBER DIAMOND MISC
1.0000 | Freq: Once | Status: DC
  Filled 2016-09-25: qty 1

## 2016-09-25 MED ORDER — PREDNISONE 20 MG PO TABS: 40.0000 mg | ORAL_TABLET | Freq: Every day | ORAL | 0 refills | Status: DC

## 2016-09-25 MED ORDER — ALBUTEROL SULFATE HFA 108 (90 BASE) MCG/ACT IN AERS: 1.0000 | INHALATION_SPRAY | Freq: Four times a day (QID) | RESPIRATORY_TRACT | 0 refills | Status: DC | PRN

## 2016-09-25 MED ORDER — ALBUTEROL SULFATE HFA 108 (90 BASE) MCG/ACT IN AERS
1.0000 | INHALATION_SPRAY | RESPIRATORY_TRACT | Status: DC
  Administered 2016-09-25: 2 via RESPIRATORY_TRACT
  Filled 2016-09-25: qty 6.7

## 2016-09-25 MED ORDER — PREDNISONE 20 MG PO TABS
40.0000 mg | ORAL_TABLET | Freq: Once | ORAL | Status: AC
  Administered 2016-09-25: 40 mg via ORAL
  Filled 2016-09-25: qty 2

## 2016-09-25 MED ORDER — ALBUTEROL SULFATE (2.5 MG/3ML) 0.083% IN NEBU
5.0000 mg | INHALATION_SOLUTION | Freq: Once | RESPIRATORY_TRACT | Status: AC
  Administered 2016-09-25: 5 mg via RESPIRATORY_TRACT

## 2016-09-25 NOTE — ED Triage Notes (Signed)
Pt c/o short of breath- sore throat- hoarse voice-- productive cough for green sputum, hx asthma, states that her inhalers have not been helping her.

## 2016-09-25 NOTE — ED Provider Notes (Signed)
[facility]-EMERGENCY DEPT Provider Note   CSN: [id number] Arrival date & time: 09/25/16  4098   History   Chief Complaint Chief Complaint  Patient presents with  . Shortness of Breath  . Asthma    HPI [name] is a 61 y.o. female.  HPI   61 year old female with history of hypertension, asthma, morbid obesity, hyperlipidemia presents today with complaints of shortness of breath.  Patient notes that over the last week she has had worsening shortness of breath, productive cough.  She notes intermittent fevers, last was yesterday at 101.6.  Patient notes that she has history of asthma and feels she is having an exacerbation.  Patient notes that she has albuterol at home via inhaler which has not improved her symptoms.  Patient reports chest tightness, and back pain nonradiating located in lower thoracic region.  She denies any nausea or vomiting.  She has a history of hospitalizations from asthma exacerbation, no history of intubations or poor outcomes of exacerbations.  Patient is a non-smoker.    Past Medical History:  Diagnosis Date  . Abnormal EKG 06/2003   History of inverted T waves V1-V3. Normal 2D echo (07/23/2003): LVEF 65%.  . Anemia    BL Hgb 11-12. Ferritin 14 - low normal (08/2007). Colonoscopy 2009 - external hemorrhoids (excellent prep). Last anemia panel (12/2010) - Iron  24, TIBC 269,  B12  316, Folate 11.9, Ferritin 73.  . Asthma   . Degenerative joint disease    BL knees (L>R), lumbar spine. Followed by Sports Medicine, Dr. Jennette Kettle.  . History of multiple pulmonary nodules    Incidental finding: CT Abd/ Pelvis (04/2010) - Several small lower lobe lung nodules, including one pure ground-glass pulmonary nodule measuring 8 mm in the left lower lobe.  Recommend follow-up chest CT (IV contrast preferred) in 6 months to document stability. //  CT Abd/ Pelvis (07/2010) -  3 mm RLL and  8 mm LLL nodule stable.  Other nodules unchanged, likely benign.  . Hyperlipidemia   .  Hypertension   . Incarcerated ventral hernia 04/2010   Noted on CT Abd/ pelvis (04/2010). Patient now s/p ventral hernia repair by Dr. Gerrit Friends (12/2010)  . 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 Lower extremity edema    Chronic. 2D echo (2005) - EF 65%.  . Obesity   . Seasonal allergies   . Wears dentures   . Wears glasses     Patient Active Problem List   Diagnosis Date Noted  . Asthma exacerbation 04/02/2016  . Chest pain 04/02/2016  . Hyperlipidemia 04/02/2016  . Pain in the chest   . Morbid obesity with BMI of 50.0-59.9, adult (HCC) 11/21/2011  . Lower abdominal pain 12/30/2010  . Preventative health care 09/10/2010  . LUNG NODULE 05/10/2010  . Low back pain 01/20/2009  . Osteoarthritis 06/02/2008  . HYPERLIPIDEMIA 08/15/2006  . ANEMIA-NOS 08/15/2006  . Essential hypertension 08/11/2006  . ASTHMA 08/11/2006    Past Surgical History:  Procedure Laterality Date  . COLONOSCOPY  2009  . FOOT SURGERY  2004    left  . INCISION AND DRAINAGE ABSCESS ANAL  07/2008   I&D and debridgement of anorectal abscess  . INCISIONAL HERNIA REPAIR  12/2010   Repair of incarcerated ventral incisional hernia with Ethicon mesh patch - performed by Dr. Gerrit Friends.   . INGUINAL HERNIA REPAIR    . JOINT REPLACEMENT Left 2013   left knee  . TOTAL KNEE ARTHROPLASTY  06/16/2011  Left TOTAL KNEE ARTHROPLASTY;  Surgeon: Kennieth Rad;  Location: [facility] OR;  Service: Orthopedics;  Laterality: Left;  left total knee arthroplasty  . TUBAL LIGATION      OB History    No data available       Home Medications    Prior to Admission medications   Medication Sig Start Date End Date Taking? Authorizing Provider  acetaminophen-codeine (TYLENOL #3) 300-30 MG tablet Take 1 tablet by mouth every 4 (four) hours as needed for moderate pain. 07/05/16  Yes Veryl Speak, FNP  albuterol (PROVENTIL HFA;VENTOLIN HFA) 108 (90 Base) MCG/ACT inhaler Inhale 2 puffs into the lungs every  6 (six) hours as needed for wheezing or shortness of breath.   Yes Historical Provider, MD  lisinopril-hydrochlorothiazide (PRINZIDE,ZESTORETIC) 20-12.5 MG tablet Take 1 tablet by mouth daily.   Yes Historical Provider, MD  pantoprazole (PROTONIX) 40 MG tablet Take 1 tablet (40 mg total) by mouth daily. Patient taking differently: Take 40 mg by mouth daily as needed (reflux).  07/05/16  Yes Veryl Speak, FNP  traMADol (ULTRAM) 50 MG tablet Take 1 tablet (50 mg total) by mouth every 6 (six) hours as needed. Patient taking differently: Take 50 mg by mouth every 6 (six) hours as needed for moderate pain.  06/11/16  Yes Earley Favor, NP  albuterol (PROVENTIL HFA;VENTOLIN HFA) 108 (90 Base) MCG/ACT inhaler Inhale 1-2 puffs into the lungs every 6 (six) hours as needed for wheezing or shortness of breath. 09/25/16   Eyvonne Mechanic, PA-C  glycopyrrolate (ROBINUL) 2 MG tablet Take 1 tablet (2 mg total) by mouth 2 (two) times daily. Patient not taking: Reported on 09/25/2016 08/18/16   Iva Boop, MD    Family History Family History  Problem Relation Age of Onset  . Diabetes Mother   . Hypertension Mother   . Stroke Mother   . Dementia Father   . Heart disease Father   . Hypertension Sister   . Diabetes Brother   . Hypertension Brother   . Rashes / Skin problems Maternal Grandfather   . Heart attack Neg Hx     Social History Social History  Substance Use Topics  . Smoking status: Former Smoker    Packs/day: 0.50    Years: 20.00    Types: Cigarettes    Quit date: 09/10/1998  . Smokeless tobacco: Never Used  . Alcohol use No     Allergies   Ketorolac tromethamine; Atrovent [ipratropium]; and Latex   Review of Systems Review of Systems  All other systems reviewed and are negative.    Physical Exam Updated Vital Signs BP 139/68 (BP Location: Right Arm)   Pulse (!) 105   Temp 98.6 F (37 C) (Oral)   Resp 19   LMP 09/09/2009   SpO2 95%   Physical Exam  Constitutional: She  is oriented to person, place, and time. She appears well-developed and well-nourished.  HENT:  Head: Normocephalic and atraumatic.  Eyes: Conjunctivae are normal. Pupils are equal, round, and reactive to light. Right eye exhibits no discharge. Left eye exhibits no discharge. No scleral icterus.  Neck: Normal range of motion. No JVD present. No tracheal deviation present.  Pulmonary/Chest: Effort normal. No stridor. No respiratory distress. She has wheezes.  Bilateral expiratory wheeze  Neurological: She is alert and oriented to person, place, and time. Coordination normal.  Psychiatric: She has a normal mood and affect. Her behavior is normal. Judgment and thought content normal.  Nursing note and vitals reviewed.  ED Treatments / Results  Labs (all labs ordered are listed, but only abnormal results are displayed) Labs Reviewed  COMPREHENSIVE METABOLIC PANEL - Abnormal; Notable for the following:       Result Value   Glucose, Bld 120 (*)    Albumin 3.3 (*)    AST 13 (*)    ALT 10 (*)    All other components within normal limits  CBC WITH DIFFERENTIAL/PLATELET - Abnormal; Notable for the following:    Hemoglobin 11.3 (*)    All other components within normal limits  I-STAT CG4 LACTIC ACID, ED    EKG  EKG Interpretation None       Radiology Dg Chest 2 View  Result Date: 09/25/2016 CLINICAL DATA:  Shortness of breath. Pharyngitis. Productive cough. EXAM: CHEST  2 VIEW COMPARISON:  06/10/2016. FINDINGS: Normal sized heart. Mild diffuse peribronchial thickening with mild progression. No airspace consolidation. Lower thoracic spine degenerative changes. IMPRESSION: Mild bronchitic changes with mild progression. Electronically Signed   By: Beckie SaltsSteven  Reid M.D.   On: 09/25/2016 10:47    Procedures Procedures (including critical care time)  Medications Ordered in ED Medications  albuterol (PROVENTIL) (2.5 MG/3ML) 0.083% nebulizer solution (not administered)  albuterol  (PROVENTIL) (2.5 MG/3ML) 0.083% nebulizer solution 5 mg (5 mg Nebulization Given 09/25/16 1018)  albuterol (PROVENTIL,VENTOLIN) solution continuous neb (10 mg/hr Nebulization Given 09/25/16 1135)  predniSONE (DELTASONE) tablet 40 mg (40 mg Oral Given 09/25/16 1315)     Initial Impression / Assessment and Plan / ED Course  I have reviewed the triage vital signs and the nursing notes.  Pertinent labs & imaging results that were available during my care of the patient were reviewed by me and considered in my medical decision making (see chart for details).      Final Clinical Impressions(s) / ED Diagnoses   Final diagnoses:  Mild persistent asthma with exacerbation    Labs: I-STAT lactic acid, CMP, CBC  Imaging: DG Chest 2 View  Consults:  Therapeutics: Albuterol, DuoNeb, prednisone  Discharge Meds: Prednisone, albuterol  Assessment/Plan: 61 year old female presents today with likely asthma exacerbation.  Prior to my assessment patient received a breathing treatment.  She is oxygenating at 100% as I enter the room.  Patient does have expiratory wheeze bilateral.  She is afebrile and not tachycardic.  Her labs showed no elevation in lactic acid, normal white count, and her chest x-ray shows mild bronchitic changes.  Patient has no signs of acute bacterial infection.  She is very well-appearing.  Due to her continued wheeze she will be given a dose of prednisone here and started on a continuous neb.  Anticipate that patient will be discharged home as this is a mild exacerbation with no apparent complicating features.  Patient received albuterol continuous neb with improvement in her wheezing.  She has very minimal wheeze on exam, is not tachypneic, no signs of respiratory distress, speaking in full sentences.  Patient was already given a dose of steroids, she is given a albuterol inhaler here with a spacer and counseled on appropriate use as I feel this is likely why she is not receiving any  improvement in her symptoms at home.  Patient does have a follow-up appointment in 2 days with her primary care provider.  Patient discharged home with strict return precautions.  She verbalized understanding and agreement to today's plan had no further questions or concerns at the time discharge.    New Prescriptions New Prescriptions   ALBUTEROL (PROVENTIL HFA;VENTOLIN HFA) 108 (90  BASE) MCG/ACT INHALER    Inhale 1-2 puffs into the lungs every 6 (six) hours as needed for wheezing or shortness of breath.     Eyvonne Mechanic, PA-C 09/25/16 1405    Alvira Monday, MD 09/25/16 [phone]

## 2016-09-25 NOTE — ED Notes (Signed)
Patient transported to X-ray 

## 2016-09-25 NOTE — Discharge Instructions (Signed)
Please read attached information. If you experience any new or worsening signs or symptoms please return to the emergency room for evaluation. Please follow-up with your primary care provider or specialist as discussed. Please use medication prescribed only as directed and discontinue taking if you have any concerning signs or symptoms.   °

## 2016-09-27 ENCOUNTER — Ambulatory Visit (INDEPENDENT_AMBULATORY_CARE_PROVIDER_SITE_OTHER): Payer: Medicare Other | Admitting: Internal Medicine

## 2016-09-27 ENCOUNTER — Encounter: Payer: Self-pay | Admitting: Internal Medicine

## 2016-09-27 VITALS — BP 142/80 | HR 70 | Temp 98.7°F | Ht 65.5 in | Wt 337.0 lb

## 2016-09-27 DIAGNOSIS — E538 Deficiency of other specified B group vitamins: Secondary | ICD-10-CM | POA: Diagnosis not present

## 2016-09-27 DIAGNOSIS — J4521 Mild intermittent asthma with (acute) exacerbation: Secondary | ICD-10-CM

## 2016-09-27 MED ORDER — CYANOCOBALAMIN 1000 MCG/ML IJ SOLN
1000.0000 ug | Freq: Once | INTRAMUSCULAR | Status: AC
  Administered 2016-09-27: 1000 ug via INTRAMUSCULAR

## 2016-09-27 MED ORDER — HYDROCODONE-HOMATROPINE 5-1.5 MG/5ML PO SYRP: 5.0000 mL | ORAL_SOLUTION | Freq: Three times a day (TID) | ORAL | 0 refills | Status: DC | PRN

## 2016-09-27 MED ORDER — LEVOCETIRIZINE DIHYDROCHLORIDE 5 MG PO TABS
5.0000 mg | ORAL_TABLET | Freq: Every evening | ORAL | 6 refills | Status: DC
Start: 2016-09-27 — End: 2016-12-06

## 2016-09-27 NOTE — Progress Notes (Signed)
Pre visit review using our clinic review tool, if applicable. No additional management support is needed unless otherwise documented below in the visit note. 

## 2016-09-27 NOTE — Assessment & Plan Note (Addendum)
Has not had follow up in some time. Several flares in the last year and may need increase in therapy. Will have her finish the prednisone and use albuterol only as needed. Rx for hycodan for cough. She will return in 2 weeks and get feno done to evaluate adequacy of therapy. Rx for xyzal for her allergic rhinitis which is likely driving some of the drainage and cough.

## 2016-09-27 NOTE — Progress Notes (Signed)
   Subjective:    Patient ID: [name], female    DOB: 1956/04/23, 61 y.o.   MRN: [id number]  HPI The patient is a 61 YO female coming in for ER follow up. She has not been seen since 2016. In the ER for asthma flare and states that they told her she needed a nebulizer. She only has albuterol and has been using it a lot lately. Seen in the ER and they gave her prednisone which she has started. This has helped some but she is still using albuterol 3-4 times per day. She denies much SOB but does get SOB with coughing fits. She was not given anything for cough in the ER. She has a spacer and uses it with her albuterol. She has never had anything else for asthma. She has  Her next concern is B12 deficiency. She went to see GI and they ran tests and told her that she was deficient in B12. She was not really explained what this meant or possible implications. She was told to contact our office about replacement but never did that. She is having some numbness and tingling in her fingertips off and on. She denies the same in her legs. She is still having some pains in her stomach.   [facility], [facility] history reviewed and updated.   Review of Systems  Constitutional: Positive for activity change and fatigue. Negative for appetite change, chills, fever and unexpected weight change.  HENT: Positive for congestion, postnasal drip, rhinorrhea and sore throat. Negative for drooling, ear discharge, ear pain, sinus pain, sinus pressure and trouble swallowing.   Eyes: Negative.   Respiratory: Positive for cough, chest tightness and shortness of breath. Negative for wheezing.   Cardiovascular: Negative.   Gastrointestinal: Positive for abdominal pain. Negative for abdominal distention, constipation, diarrhea and nausea.  Musculoskeletal: Negative.   Skin: Negative.   Neurological: Positive for numbness. Negative for dizziness, tremors, syncope, facial asymmetry, weakness and headaches.  Psychiatric/Behavioral:  Negative.       Objective:   Physical Exam  Constitutional: She is oriented to person, place, and time. She appears well-developed and well-nourished.  HENT:  Head: Normocephalic and atraumatic.  Right Ear: External ear normal.  Left Ear: External ear normal.  Oropharynx with redness and clear drainage, nose without crusting, no sinus pressure.   Eyes: EOM are normal.  Neck: Normal range of motion.  Cardiovascular: Normal rate and regular rhythm.   Pulmonary/Chest: Effort normal and breath sounds normal. No respiratory distress. She has no wheezes. She has no rales. She exhibits no tenderness.  Able to verbalize well without SOB  Abdominal: Soft. She exhibits no distension. There is no tenderness. There is no rebound.  Neurological: She is alert and oriented to person, place, and time. Coordination normal.  Skin: Skin is warm and dry.   Vitals:   09/27/16 0919  BP: (!) 142/80  Pulse: 70  Temp: 98.7 F (37.1 C)  TempSrc: Oral  Weight: (!) 337 lb (152.9 kg)  Height: 5' 5.5" (1.664 m)      Assessment & Plan:  B12 shot given at visit.

## 2016-09-27 NOTE — Assessment & Plan Note (Addendum)
B12 shot given at visit and advised to return in 2 weeks for visit and another B12 shot. Needs every 2 weeks for 2 months then repeat level. If level is adequately increased she can change to oral replacement. We talked about the fact that her numbness could be coming from B12 deficiency and if we do not replace could be permanent.

## 2016-09-27 NOTE — Patient Instructions (Signed)
We have given you the B12 shot today and will see you back in 2 weeks for the next one. You will need B12 shots every 2 weeks for 2 months then if the levels are coming up you can switch to the pill daily.   We have sent in a medicine called xyzal which is for the drainage which is causing the asthma to be worse.   Use the albuterol when needed for problems with breathing. If you are coughing a lot we have given you a cough medicine to try to stop the cough instead of the albuterol. The more you use albuterol the less it works.

## 2016-10-11 ENCOUNTER — Encounter: Payer: Self-pay | Admitting: Internal Medicine

## 2016-10-11 ENCOUNTER — Ambulatory Visit (INDEPENDENT_AMBULATORY_CARE_PROVIDER_SITE_OTHER): Payer: Medicare Other | Admitting: Internal Medicine

## 2016-10-11 VITALS — BP 130/76 | HR 81 | Temp 98.7°F | Resp 16 | Ht 65.5 in | Wt 330.0 lb

## 2016-10-11 DIAGNOSIS — J4520 Mild intermittent asthma, uncomplicated: Secondary | ICD-10-CM

## 2016-10-11 DIAGNOSIS — E611 Iron deficiency: Secondary | ICD-10-CM

## 2016-10-11 DIAGNOSIS — E538 Deficiency of other specified B group vitamins: Secondary | ICD-10-CM

## 2016-10-11 MED ORDER — CYANOCOBALAMIN 1000 MCG/ML IJ SOLN
1000.0000 ug | Freq: Once | INTRAMUSCULAR | Status: AC
  Administered 2016-10-11: 1000 ug via INTRAMUSCULAR

## 2016-10-11 MED ORDER — UMECLIDINIUM-VILANTEROL 62.5-25 MCG/INH IN AEPB: 1.0000 | INHALATION_SPRAY | Freq: Every day | RESPIRATORY_TRACT | 3 refills | Status: DC

## 2016-10-11 NOTE — Assessment & Plan Note (Addendum)
Rx for anoro for better breathing. Suspect OHS contributing. She is taking xyzal for allergies now which is helping to slow drainage. Talked to her about the fact that cough often takes weeks to resolve and lungs are clear today on exam no wheezing. No flare or indication for antibiotics or steroids today. Tried to check FeNO but patient was unable to comply with device.

## 2016-10-11 NOTE — Assessment & Plan Note (Signed)
B12 shot given today, needs nurse visit for every 2 weeks another 2 times, then monthly.

## 2016-10-11 NOTE — Progress Notes (Signed)
Pre visit review using our clinic review tool, if applicable. No additional management support is needed unless otherwise documented below in the visit note. 

## 2016-10-11 NOTE — Progress Notes (Signed)
   Subjective:    Patient ID: [name], female    DOB: 05-Jun-1956, 61 y.o.   MRN: [id number]  HPI The patient is a 61 YO female coming in for follow up of her asthma (recently given prednisone for flare, finished steroids recently, still coughing some extra, not using albuterol as often, several flares in the last year, not exercising and weight is up as well, never been on anything but albuterol and has spacer), and her B12 deficiency (has severe deficiency and some numbness, given B12 about 2 weeks ago and needs every 2 week replacement for 2 months, then monthly).   Review of Systems  Constitutional: Positive for activity change. Negative for appetite change, chills, fatigue, fever and unexpected weight change.  HENT: Positive for congestion, postnasal drip and rhinorrhea. Negative for ear discharge, ear pain, nosebleeds, sinus pain, sinus pressure, sore throat and trouble swallowing.   Eyes: Negative.   Respiratory: Positive for cough and shortness of breath. Negative for chest tightness and wheezing.   Cardiovascular: Negative.   Gastrointestinal: Negative.   Musculoskeletal: Negative.   Skin: Negative.   Neurological: Negative.       Objective:   Physical Exam  Constitutional: She is oriented to person, place, and time. She appears well-developed and well-nourished.  Obese  HENT:  Head: Normocephalic and atraumatic.  Eyes: EOM are normal.  Neck: Normal range of motion.  Cardiovascular: Normal rate and regular rhythm.   Pulmonary/Chest: Effort normal. No respiratory distress. She has no wheezes.  Some poor effort, no wheezing or rales  Abdominal: Soft. She exhibits no distension. There is no tenderness.  Neurological: She is alert and oriented to person, place, and time.  Skin: Skin is warm and dry.   Vitals:   10/11/16 1008  BP: 130/76  Pulse: 81  Resp: 16  Temp: 98.7 F (37.1 C)  TempSrc: Oral  SpO2: 99%  Weight: (!) 330 lb (149.7 kg)  Height: 5' 5.5" (1.664 m)    FeNO: patient is unable to comply with the device for testing    Assessment & Plan:  B12 shot given at visit

## 2016-10-11 NOTE — Patient Instructions (Addendum)
We have sent in the anoro inhaler which you will use everyday. Do 1 puff in the morning to help you feel like you can breathe more.   Still use the albuterol if needed for the breathing.   Come back in about 2 weeks for the next B12 shot.

## 2016-10-12 ENCOUNTER — Ambulatory Visit (INDEPENDENT_AMBULATORY_CARE_PROVIDER_SITE_OTHER): Payer: Medicare Other | Admitting: Internal Medicine

## 2016-10-12 ENCOUNTER — Encounter: Payer: Self-pay | Admitting: Internal Medicine

## 2016-10-12 VITALS — BP 118/60 | HR 72 | Ht 65.0 in | Wt 331.0 lb

## 2016-10-12 DIAGNOSIS — R933 Abnormal findings on diagnostic imaging of other parts of digestive tract: Secondary | ICD-10-CM

## 2016-10-12 DIAGNOSIS — A09 Infectious gastroenteritis and colitis, unspecified: Secondary | ICD-10-CM

## 2016-10-12 DIAGNOSIS — R1033 Periumbilical pain: Secondary | ICD-10-CM

## 2016-10-12 DIAGNOSIS — R197 Diarrhea, unspecified: Secondary | ICD-10-CM

## 2016-10-12 NOTE — Progress Notes (Signed)
   [name] 61 y.o. November 07, 1955 [id number]  Assessment & Plan:   Encounter Diagnoses  Name Primary?  . Periumbilical abdominal pain Yes  . Abnormal CT scan, gastrointestinal tract   . Diarrhea of presumed infectious origin    She did not respond to glycopyrrolate. She is still tender and having the same problems, about 3 months ago on a CT scan she had mesenteric haziness in association with some lymph nodes though I don't think they were enlarged. That is a nonspecific finding but given persistence of symptoms we'll repeat a CT scan looking for interval change. Further plans pending that. It sounds like her diarrhea is resolving and I presume she had some sort of infection as her daughter was ill as well, she can use loperamide when necessary.  Note she has started on B12 supplementation through primary care.  Periumbilical abdominal pain and abnormal CT scan are stable diarrhea is new  I appreciate the opportunity to care for this patient. CC: Myrlene Broker, MD    Subjective:   Chief Complaint: abdominal pain, diarrhea  HPI Patient returns for follow-up about 2 months after I saw her, she was having periumbilical abdominal pain, she had a CT scan with an abnormal mesentery haziness in the setting of some lymph nodes that they weren't enlarged. She was treated with glycopyrrolate she says that didn't help. She still has the dull aching pain in intermittent cramps in the periumbilical area and more so on the right and will have pain after he times. More recently diarrhea for several days, watery stools very frequently first but now only about once a day getting better. Her daughter had something similar but was less induration. No bleeding no fever no vomiting. Some nausea. As best I can tell no recent antibiotic use   She saw primary care eventually and got started on B12 supplementation with injections. She has had 2 so far. She cannot tell any difference in numbness  and tingling or any of her other symptoms after those 2 injections.  Medications, allergies, past medical history, past surgical history, family history and social history are reviewed and updated in the EMR.  Review of Systems As above, recent asthma flare seen in the ED  Objective:   Physical Exam BP 118/60 (BP Location: Left Wrist, Patient Position: Sitting, Cuff Size: Normal)   Pulse 72   Ht  (1.651 m) Comment: height measured without shoes  Wt (!) 331 lb (150.1 kg)   LMP 09/09/2009   BMI 55.08 kg/m  Morbidly obese NAD Eyes are anicteric Abdomen is obese soft and mod tender periumbilical, bowel sounds present Alert and oriented x 3 Appropriate mood and affect  I have reviewed recent primary care notes, she was in the emergency room and asthma flare 2 weeks ago and I reviewed that note and labs

## 2016-10-12 NOTE — Patient Instructions (Signed)
You have been scheduled for a CT scan of the abdomen and pelvis at [facility] ([address]---this is in the same building as [facility]).   You are scheduled on 10/24/16 at 9:15AM. You should arrive 15 minutes prior to your appointment time for registration. Please follow the written instructions below on the day of your exam:  WARNING: IF YOU ARE ALLERGIC TO IODINE/X-RAY DYE, PLEASE NOTIFY RADIOLOGY IMMEDIATELY AT [phone]! YOU WILL BE GIVEN A 13 HOUR PREMEDICATION PREP.  1) Do not eat or drink anything after 5:15AM (4 hours prior to your test) 2) You have been given 2 bottles of oral contrast to drink. The solution may taste better if refrigerated, but do NOT add ice or any other liquid to this solution. Shake  well before drinking.    Drink 1 bottle of contrast @ 7:15AM (2 hours prior to your exam)  Drink 1 bottle of contrast @ 8:15AM (1 hour prior to your exam)  You may take any medications as prescribed with a small amount of water except for the following: Metformin, Glucophage, Glucovance, Avandamet, Riomet, Fortamet, Actoplus Met, Janumet, Glumetza or Metaglip. The above medications must be held the day of the exam AND 48 hours after the exam.  The purpose of you drinking the oral contrast is to aid in the visualization of your intestinal tract. The contrast solution may cause some diarrhea. Before your exam is started, you will be given a small amount of fluid to drink. Depending on your individual set of symptoms, you may also receive an intravenous injection of x-ray contrast/dye. Plan on being at [facility] for 30 minutes or longer, depending on the type of exam you are having performed.  This test typically takes 30-45 minutes to complete.  If you have any questions regarding your exam or if you need to reschedule, you may call the CT department at [phone] between the hours of 8:00 am and 5:00 pm,  Monday-Friday.  ________________________________________________________________________  Dennis Bast may take Imodium for your diarrhea.  I appreciate the opportunity to care for you. Silvano Rusk, MD, [facility]

## 2016-10-17 ENCOUNTER — Telehealth (INDEPENDENT_AMBULATORY_CARE_PROVIDER_SITE_OTHER): Payer: Self-pay | Admitting: *Deleted

## 2016-10-17 NOTE — Telephone Encounter (Signed)
Scheduled an appt for pt.

## 2016-10-17 NOTE — Telephone Encounter (Signed)
I'm not aware of any B12 injections causing her knee to give out.  I would recommend she talk to her PCP about pain meds.

## 2016-10-17 NOTE — Telephone Encounter (Signed)
Patient called in this morning with some concerns from her right leg. She usually receives B-12 Injections but she is wondering if this is causing her leg to give out on her? She is having quite a bit of pain and would like to know what Dr. Roda Shutters would be able to help her in any way? Her CB # [phone]. Thank you

## 2016-10-17 NOTE — Telephone Encounter (Signed)
Pleas advise.

## 2016-10-21 ENCOUNTER — Ambulatory Visit (INDEPENDENT_AMBULATORY_CARE_PROVIDER_SITE_OTHER): Payer: Medicare Other | Admitting: Orthopaedic Surgery

## 2016-10-21 ENCOUNTER — Encounter (INDEPENDENT_AMBULATORY_CARE_PROVIDER_SITE_OTHER): Payer: Self-pay | Admitting: Orthopaedic Surgery

## 2016-10-21 ENCOUNTER — Ambulatory Visit (INDEPENDENT_AMBULATORY_CARE_PROVIDER_SITE_OTHER): Payer: Medicare Other

## 2016-10-21 DIAGNOSIS — T8484XA Pain due to internal orthopedic prosthetic devices, implants and grafts, initial encounter: Secondary | ICD-10-CM | POA: Diagnosis not present

## 2016-10-21 DIAGNOSIS — M2352 Chronic instability of knee, left knee: Secondary | ICD-10-CM | POA: Diagnosis not present

## 2016-10-21 DIAGNOSIS — Z96652 Presence of left artificial knee joint: Secondary | ICD-10-CM | POA: Diagnosis not present

## 2016-10-21 NOTE — Progress Notes (Signed)
Office Visit Note   Patient: [name]           Date of Birth: 03-07-1956           MRN: [id number] Visit Date: 10/21/2016              Requested by: Hoyt Koch, MD [place], [place] [id number] PCP: Hoyt Koch, MD   Assessment & Plan: Visit Diagnoses:  1. 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 X-ray show instability of the implant with anterior subluxation of the tibia. She also is demonstrating excessive poly-wear.  I will see her back in a week to review the inflammatory labs and discuss further treatment  Follow-Up Instructions: Return in about 1 week (around 10/28/2016).   Orders:  Orders Placed This Encounter  Procedures  . XR KNEE 3 VIEW LEFT  . C-reactive protein  . Sed Rate (ESR)  . CBC With Differential   No orders of the defined types were placed in this encounter.     Procedures: No procedures performed   Clinical Data: No additional findings.   Subjective: Chief Complaint  Patient presents with  . Left Knee - Pain    Patient is a 61 year old female with worsening left knee pain and instability. She underwent left total knee replacement by Dr. Eulas Post. She had no postoperative complications. Her knee is starting to buckle medially. She has throbbing pain. The knee feels stiff. She endorses low-grade fevers and chills and swelling.    Review of Systems  Constitutional: Negative.   HENT: Negative.   Eyes: Negative.   Respiratory: Negative.   Cardiovascular: Negative.   Endocrine: Negative.   Musculoskeletal: Negative.   Neurological: Negative.   Hematological: Negative.   Psychiatric/Behavioral: Negative.   All other systems reviewed and are negative.    Objective: Vital Signs: LMP 09/09/2009   Physical Exam  Constitutional: She is oriented to person, place, and time. She appears well-developed and  well-nourished.  HENT:  Head: Normocephalic and atraumatic.  Eyes: EOM are normal.  Neck: Neck supple.  Pulmonary/Chest: Effort normal.  Abdominal: Soft.  Neurological: She is alert and oriented to person, place, and time.  Skin: Skin is warm. Capillary refill takes less than 2 seconds.  Psychiatric: She has a normal mood and affect. Her behavior is normal. Judgment and thought content normal.  Nursing note and vitals reviewed.   Ortho Exam Left knee exam shows a healed postsurgical scar. She has a progressive valgus deformity. She has a very large thigh and soft tissue all below. She is morbidly obese with a BMI of greater than 50. Her range of motion is very painful. Valgus testing shows mild laxity of the MCL with a firm endpoint. Varus testing is stable. There is no obvious signs of infection or warmth of the skin. No cellulitis. Specialty Comments:  No specialty comments available.  Imaging: Xr Knee 3 View Left  Result Date: 10/21/2016 Status post total knee replacement about signs of lucency. There is anterior translation compared to the femoral component. Appears that there is also polyethylene wear.    PMFS History: Patient Active Problem List   Diagnosis Date Noted  . 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 B12 deficiency 09/27/2016  . Hyperlipidemia 04/02/2016  . Morbid obesity with BMI of 50.0-59.9, adult ([place]) 11/21/2011  . Lower abdominal pain 12/30/2010  .  Preventative health care 09/10/2010  . LUNG NODULE 05/10/2010  . Low back pain 01/20/2009  . Osteoarthritis 06/02/2008  . HYPERLIPIDEMIA 08/15/2006  . ANEMIA-NOS 08/15/2006  . Essential hypertension 08/11/2006  . Asthma 08/11/2006   Past Medical History:  Diagnosis Date  . Abnormal EKG 06/2003   History of inverted T waves V1-V3. Normal 2D echo (07/23/2003): LVEF 65%.  . Anemia    BL Hgb 11-12. Ferritin 14 - low normal (08/2007). Colonoscopy 2009 - external hemorrhoids (excellent  prep). Last anemia panel (12/2010) - Iron  24, TIBC 269,  B12  316, Folate 11.9, Ferritin 73.  . Asthma   . Degenerative joint disease    BL knees (L>R), lumbar spine. Followed by Sports Medicine, Dr. Nori Riis.  . History of multiple pulmonary nodules    Incidental finding: CT Abd/ Pelvis (04/2010) - Several small lower lobe lung nodules, including one pure ground-glass pulmonary nodule measuring 8 mm in the left lower lobe.  Recommend follow-up chest CT (IV contrast preferred) in 6 months to document stability. //  CT Abd/ Pelvis (07/2010) -  3 mm RLL and  8 mm LLL nodule stable.  Other nodules unchanged, likely benign.  . Hyperlipidemia   . Hypertension   . Incarcerated ventral hernia 04/2010   Noted on CT Abd/ pelvis (04/2010). Patient now s/p ventral hernia repair by Dr. Harlow Asa (12/2010)  . 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 Lower extremity edema    Chronic. 2D echo (2005) - EF 65%.  . Obesity   . Seasonal allergies   . Wears dentures   . Wears glasses     Family History  Problem Relation Age of Onset  . Diabetes Mother   . Hypertension Mother   . Stroke Mother   . Dementia Father   . Heart disease Father   . Hypertension Sister   . Diabetes Brother   . Hypertension Brother   . Rashes / Skin problems Maternal Grandfather   . Heart attack Neg Hx     Past Surgical History:  Procedure Laterality Date  . COLONOSCOPY  2009  . FOOT SURGERY  2004    left  . INCISION AND DRAINAGE ABSCESS ANAL  07/2008   I&D and debridgement of anorectal abscess  . INCISIONAL HERNIA REPAIR  12/2010   Repair of incarcerated ventral incisional hernia with Ethicon mesh patch - performed by Dr. Harlow Asa.   . INGUINAL HERNIA REPAIR    . JOINT REPLACEMENT Left 2013   left knee  . TOTAL KNEE ARTHROPLASTY  06/16/2011   Left TOTAL KNEE ARTHROPLASTY;  Surgeon: Sharmon Revere;  Location: [facility];  Service: Orthopedics;  Laterality: Left;  left total knee arthroplasty  . TUBAL  LIGATION     Social History   Occupational History  . Not on file.   Social History Main Topics  . Smoking status: Former Smoker    Packs/day: 0.50    Years: 20.00    Types: Cigarettes    Quit date: 09/10/1998  . Smokeless tobacco: Never Used  . Alcohol use No  . Drug use: No  . Sexual activity: Yes    Partners: Male

## 2016-10-22 LAB — SEDIMENTATION RATE: Sed Rate: 18 mm/hr (ref 0–30)

## 2016-10-24 ENCOUNTER — Ambulatory Visit (INDEPENDENT_AMBULATORY_CARE_PROVIDER_SITE_OTHER)
Admission: RE | Admit: 2016-10-24 | Discharge: 2016-10-24 | Disposition: A | Discharge status: Home / Self Care | Payer: Medicare Other | Source: Ambulatory Visit | Attending: Internal Medicine | Admitting: Internal Medicine

## 2016-10-24 ENCOUNTER — Other Ambulatory Visit: Payer: Medicare Other

## 2016-10-24 DIAGNOSIS — R1033 Periumbilical pain: Secondary | ICD-10-CM

## 2016-10-24 DIAGNOSIS — R933 Abnormal findings on diagnostic imaging of other parts of digestive tract: Secondary | ICD-10-CM | POA: Diagnosis not present

## 2016-10-24 DIAGNOSIS — R109 Unspecified abdominal pain: Secondary | ICD-10-CM | POA: Diagnosis not present

## 2016-10-24 LAB — C-REACTIVE PROTEIN: CRP: 2.5 mg/L (ref <8.0)

## 2016-10-24 MED ORDER — IOPAMIDOL (ISOVUE-300) INJECTION 61%
100.0000 mL | Freq: Once | INTRAVENOUS | Status: AC | PRN
  Administered 2016-10-24: 100 mL via INTRAVENOUS

## 2016-10-25 ENCOUNTER — Ambulatory Visit: Payer: Medicare Other

## 2016-10-27 ENCOUNTER — Telehealth: Payer: Self-pay | Admitting: Internal Medicine

## 2016-10-27 ENCOUNTER — Encounter (INDEPENDENT_AMBULATORY_CARE_PROVIDER_SITE_OTHER): Payer: Self-pay | Admitting: Orthopaedic Surgery

## 2016-10-27 ENCOUNTER — Ambulatory Visit (INDEPENDENT_AMBULATORY_CARE_PROVIDER_SITE_OTHER): Payer: Medicare Other | Admitting: Orthopaedic Surgery

## 2016-10-27 DIAGNOSIS — Z96652 Presence of left artificial knee joint: Secondary | ICD-10-CM | POA: Diagnosis not present

## 2016-10-27 DIAGNOSIS — T8484XA Pain due to internal orthopedic prosthetic devices, implants and grafts, initial encounter: Secondary | ICD-10-CM | POA: Diagnosis not present

## 2016-10-27 MED ORDER — METHOCARBAMOL 500 MG PO TABS
500.0000 mg | ORAL_TABLET | Freq: Four times a day (QID) | ORAL | 2 refills | Status: DC | PRN
Start: 2016-10-27 — End: 2017-09-15

## 2016-10-27 NOTE — Telephone Encounter (Signed)
Patient returning Sheri's call about ct results from 4.16.18

## 2016-10-27 NOTE — Telephone Encounter (Signed)
See CT results for additional details 

## 2016-10-27 NOTE — Progress Notes (Signed)
These latter know there is good news here there are no significant problems identified. As far as her pain is concerned I wonder if it's not in the nerves and other tissues of her abdomen that will be seen on a test. There is medication that might help this but I need her to schedule an office visit to return and review possible treatments.  Before she does that though she should make sure she's had a recent gynecologic evaluation. That is to say within the last year specifically evaluation for this area umbilical and lower abdominal pain.

## 2016-10-27 NOTE — Progress Notes (Signed)
Office Visit Note   Patient: [name]           Date of Birth: 05/15/1956           MRN: [id number] Visit Date: 10/27/2016              Requested by: Myrlene Broker, MD [address] [place], [place] [address] PCP: Myrlene Broker, MD   Assessment & Plan: Visit Diagnoses:  1. 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 With her BMI of greater than 50 she is at significant risk for complications including deep infection which would require either a lifelong suppressive antibiotics or above-the-knee amputation. My recommendation is to continue to use the Bledsoe brace and she is feeling a lot more stable. In the meantime I do want her to lose weight in order to get her BMI within acceptable range. At that time we can consider revision surgery. I'll see her back as needed. We did refer her to weight loss clinic and bariatric clinic for further evaluation and treatment.  Follow-Up Instructions: Return if symptoms worsen or fail to improve.   Orders:  Orders Placed This Encounter  Procedures  . Amb Ref to Medical Weight Management  . Ambulatory referral to General Surgery   Meds ordered this encounter  Medications  . methocarbamol (ROBAXIN) 500 MG tablet    Sig: Take 1 tablet (500 mg total) by mouth every 6 (six) hours as needed for muscle spasms.    Dispense:  30 tablet    Refill:  2      Procedures: No procedures performed   Clinical Data: No additional findings.   Subjective: Chief Complaint  Patient presents with  . Left Knee - Pain, Follow-up    [name] follows up today for her knee pain. She did get the Bledsoe brace. She states that this does provide significant amount of stability to her knee. She is doing better. Her labs that we drew last time were negative for infection.    Review of Systems   Objective: Vital Signs: LMP 09/09/2009   Physical  Exam  Ortho Exam Left knee exam is stable. Specialty Comments:  No specialty comments available.  Imaging: No results found.   PMFS History: Patient Active Problem List   Diagnosis Date Noted  . 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 B12 deficiency 09/27/2016  . Hyperlipidemia 04/02/2016  . Morbid obesity with BMI of 50.0-59.9, adult (HCC) 11/21/2011  . Lower abdominal pain 12/30/2010  . Preventative health care 09/10/2010  . LUNG NODULE 05/10/2010  . Low back pain 01/20/2009  . Osteoarthritis 06/02/2008  . HYPERLIPIDEMIA 08/15/2006  . ANEMIA-NOS 08/15/2006  . Essential hypertension 08/11/2006  . Asthma 08/11/2006   Past Medical History:  Diagnosis Date  . Abnormal EKG 06/2003   History of inverted T waves V1-V3. Normal 2D echo (07/23/2003): LVEF 65%.  . Anemia    BL Hgb 11-12. Ferritin 14 - low normal (08/2007). Colonoscopy 2009 - external hemorrhoids (excellent prep). Last anemia panel (12/2010) - Iron  24, TIBC 269,  B12  316, Folate 11.9, Ferritin 73.  . Asthma   . Degenerative joint disease    BL knees (L>R), lumbar spine. Followed by Sports Medicine, Dr. Jennette Kettle.  . History of multiple pulmonary nodules    Incidental finding: CT Abd/ Pelvis (04/2010) - Several small lower lobe lung nodules,  including one pure ground-glass pulmonary nodule measuring 8 mm in the left lower lobe.  Recommend follow-up chest CT (IV contrast preferred) in 6 months to document stability. //  CT Abd/ Pelvis (07/2010) -  3 mm RLL and  8 mm LLL nodule stable.  Other nodules unchanged, likely benign.  . Hyperlipidemia   . Hypertension   . Incarcerated ventral hernia 04/2010   Noted on CT Abd/ pelvis (04/2010). Patient now s/p ventral hernia repair by Dr. Gerrit Friends (12/2010)  . 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 Lower extremity edema    Chronic. 2D echo (2005) - EF 65%.  . Obesity   . Seasonal allergies   . Wears dentures   . Wears glasses       Family History  Problem Relation Age of Onset  . Diabetes Mother   . Hypertension Mother   . Stroke Mother   . Dementia Father   . Heart disease Father   . Hypertension Sister   . Diabetes Brother   . Hypertension Brother   . Rashes / Skin problems Maternal Grandfather   . Heart attack Neg Hx     Past Surgical History:  Procedure Laterality Date  . COLONOSCOPY  2009  . FOOT SURGERY  2004    left  . INCISION AND DRAINAGE ABSCESS ANAL  07/2008   I&D and debridgement of anorectal abscess  . INCISIONAL HERNIA REPAIR  12/2010   Repair of incarcerated ventral incisional hernia with Ethicon mesh patch - performed by Dr. Gerrit Friends.   . INGUINAL HERNIA REPAIR    . JOINT REPLACEMENT Left 2013   left knee  . TOTAL KNEE ARTHROPLASTY  06/16/2011   Left TOTAL KNEE ARTHROPLASTY;  Surgeon: Kennieth Rad;  Location: [facility] OR;  Service: Orthopedics;  Laterality: Left;  left total knee arthroplasty  . TUBAL LIGATION     Social History   Occupational History  . Not on file.   Social History Main Topics  . Smoking status: Former Smoker    Packs/day: 0.50    Years: 20.00    Types: Cigarettes    Quit date: 09/10/1998  . Smokeless tobacco: Never Used  . Alcohol use No  . Drug use: No  . Sexual activity: Yes    Partners: Male

## 2016-11-02 ENCOUNTER — Telehealth (INDEPENDENT_AMBULATORY_CARE_PROVIDER_SITE_OTHER): Payer: Self-pay

## 2016-11-02 NOTE — Telephone Encounter (Signed)
[facility] called stating that patients labs have resulted for Sed-Rate and CRP on patient.   Stated that if any more test were needed, then patient labs will need to be recollected.

## 2016-11-29 ENCOUNTER — Telehealth: Payer: Self-pay | Admitting: Internal Medicine

## 2016-11-29 NOTE — Telephone Encounter (Signed)
Can you call and schedule patient?

## 2016-11-29 NOTE — Telephone Encounter (Signed)
If she is having breathing problems needs visit.

## 2016-11-29 NOTE — Telephone Encounter (Signed)
Pt called and said that she is wanting a prescription or referral for a breathing treatment machine. She said that she is having to go to the emergency room very often to have them done and would like to have a machine at home so that she can do them when needed. Please advise.

## 2016-11-29 NOTE — Telephone Encounter (Signed)
Told patient she would most likely need visit, please advise

## 2016-11-29 NOTE — Telephone Encounter (Signed)
Appointment scheduled.

## 2016-11-30 ENCOUNTER — Telehealth: Payer: Self-pay | Admitting: Internal Medicine

## 2016-11-30 ENCOUNTER — Ambulatory Visit: Payer: Medicare Other | Admitting: Internal Medicine

## 2016-11-30 NOTE — Telephone Encounter (Signed)
Fine with me

## 2016-11-30 NOTE — Telephone Encounter (Signed)
It is Ok with me. BJ 

## 2016-11-30 NOTE — Telephone Encounter (Signed)
Pt would like to switch from Dr Okey Duprerawford to Dr [place]. Pt states she lives closer to our office Is that OK?

## 2016-12-01 NOTE — Telephone Encounter (Signed)
Pt has been scheduled.  °

## 2016-12-02 ENCOUNTER — Encounter: Payer: Self-pay | Admitting: Family Medicine

## 2016-12-02 ENCOUNTER — Ambulatory Visit (INDEPENDENT_AMBULATORY_CARE_PROVIDER_SITE_OTHER): Payer: Medicare Other | Admitting: Family Medicine

## 2016-12-02 VITALS — BP 132/80 | HR 90 | Resp 12 | Ht 65.0 in | Wt 332.4 lb

## 2016-12-02 DIAGNOSIS — E669 Obesity, unspecified: Secondary | ICD-10-CM

## 2016-12-02 DIAGNOSIS — J45901 Unspecified asthma with (acute) exacerbation: Secondary | ICD-10-CM | POA: Diagnosis not present

## 2016-12-02 DIAGNOSIS — I10 Essential (primary) hypertension: Secondary | ICD-10-CM | POA: Diagnosis not present

## 2016-12-02 DIAGNOSIS — E538 Deficiency of other specified B group vitamins: Secondary | ICD-10-CM | POA: Diagnosis not present

## 2016-12-02 DIAGNOSIS — Z6841 Body Mass Index (BMI) 40.0 and over, adult: Secondary | ICD-10-CM

## 2016-12-02 DIAGNOSIS — G479 Sleep disorder, unspecified: Secondary | ICD-10-CM | POA: Diagnosis not present

## 2016-12-02 DIAGNOSIS — IMO0001 Reserved for inherently not codable concepts without codable children: Secondary | ICD-10-CM

## 2016-12-02 DIAGNOSIS — J309 Allergic rhinitis, unspecified: Secondary | ICD-10-CM | POA: Diagnosis not present

## 2016-12-02 DIAGNOSIS — R739 Hyperglycemia, unspecified: Secondary | ICD-10-CM | POA: Diagnosis not present

## 2016-12-02 LAB — POCT GLYCOSYLATED HEMOGLOBIN (HGB A1C): Hemoglobin A1C: 5.6

## 2016-12-02 MED ORDER — CYANOCOBALAMIN 1000 MCG/ML IJ SOLN
1000.0000 ug | Freq: Once | INTRAMUSCULAR | Status: AC
  Administered 2016-12-02: 1000 ug via INTRAMUSCULAR

## 2016-12-02 MED ORDER — BUDESONIDE-FORMOTEROL FUMARATE 160-4.5 MCG/ACT IN AERO: 2.0000 | INHALATION_SPRAY | Freq: Two times a day (BID) | RESPIRATORY_TRACT | 3 refills | Status: DC

## 2016-12-02 MED ORDER — PREDNISONE 20 MG PO TABS: 40.0000 mg | ORAL_TABLET | Freq: Every day | ORAL | 0 refills | Status: AC

## 2016-12-02 MED ORDER — MONTELUKAST SODIUM 10 MG PO TABS: 10.0000 mg | ORAL_TABLET | Freq: Every day | ORAL | 3 refills | Status: DC

## 2016-12-02 MED ORDER — FLUTICASONE PROPIONATE 50 MCG/ACT NA SUSP: 1.0000 | Freq: Two times a day (BID) | NASAL | 3 refills | Status: DC

## 2016-12-02 MED ORDER — IPRATROPIUM-ALBUTEROL 0.5-2.5 (3) MG/3ML IN SOLN
3.0000 mL | Freq: Once | RESPIRATORY_TRACT | Status: AC
  Administered 2016-12-02: 3 mL via RESPIRATORY_TRACT

## 2016-12-02 NOTE — Patient Instructions (Signed)
A few things to remember from today's visit:   Persistent asthma with acute exacerbation, unspecified asthma severity - Plan: ipratropium-albuterol (DUONEB) 0.5-2.5 (3) MG/3ML nebulizer solution 3 mL, budesonide-formoterol (SYMBICORT) 160-4.5 MCG/ACT inhaler, montelukast (SINGULAIR) 10 MG tablet  B12 deficiency  Class 3 obesity with serious comorbidity and body mass index (BMI) of 50.0 to 59.9 in adult, unspecified obesity type (HCC)  Hyperglycemia - Plan: POC HgB A1c  Allergic rhinitis, unspecified seasonality, unspecified trigger - Plan: montelukast (SINGULAIR) 10 MG tablet, fluticasone (FLONASE) 50 MCG/ACT nasal spray  Sleep disorder, unspecified - Plan: Ambulatory referral to Pulmonology  Symbicort started. Singulair added at night.  Albuterol inh 2 puff every 6 hours for a week then as needed for wheezing or shortness of breath.   Please be sure medication list is accurate. If a new problem present, please set up appointment sooner than planned today.

## 2016-12-02 NOTE — Progress Notes (Signed)
HPI:   [name] is a 61 y.o. female, who is here today to establish care.  Former PCP: [name] Last preventive routine visit: 1-2 years.  Chronic medical problems: Obesity,asthma,HTN,allergic rhinitis, GERD among some. She follows with [name] for left knee pain, s/p TKR.Also Hx of lower back pain with radiculopathy. She also follows with GI, [name], as needed. She mentions abdominal hernia that is being followed by [name], s/p hernia repair.   Hx of B12 deficiency.She states that she has had 2 B12 injections sine last lab was done, she is not on oral supplementation. She would like B12 given today.   Lab Results  Component Value Date   VITAMINB12 180 (L) 08/18/2016     Hypertension:  Dx over 10 years ago.  Currently on Lisinopril-HCTZ 20-12.5 mg daily.   She has not noted unusual headache, visual changes, exertional chest pain, dyspnea,  focal weakness, or edema.   Lab Results  Component Value Date   CREATININE 0.73 09/25/2016   BUN 8 09/25/2016   NA 138 09/25/2016   K 3.7 09/25/2016   CL 107 09/25/2016   CO2 23 09/25/2016     Asthma: She is on Albuterol in 2 puff q 6-8 hours with spacer. She is reporting frequent ER visits due to exacerbations.  + wheezing intermittently, worse for the past 3 days. She has symptoms all year long.  + Allergic rhinitis, having nasal congestion and rhinorrhea. Denies fever or chills.  Xyzal 5 mg taking it as needed.  She is not using Anoro because it was not covered by her insurance.  -Noted some elevated glucose in the past: 120's and 130's. She has not been consistent with a healthy diet or regular exercise,planning on starting both in the next few days.   Lab Results  Component Value Date   HGBA1C 5.7 (H) 04/02/2016   When inquired about Hx of OSA, she states that she is not aware but frequently she wakes up feeling like she is "choking." + Fatigue.   Review of Systems  Constitutional:  Positive for fatigue. Negative for activity change, appetite change and fever.  HENT: Positive for congestion, postnasal drip and sneezing. Negative for ear pain, mouth sores, nosebleeds, sinus pressure, sore throat, trouble swallowing and voice change.   Eyes: Positive for itching. Negative for discharge and redness.  Respiratory: Positive for cough, shortness of breath and wheezing.   Cardiovascular: Negative for chest pain, palpitations and leg swelling.  Gastrointestinal: Negative for abdominal pain, nausea and vomiting.  Endocrine: Negative for polydipsia, polyphagia and polyuria.  Genitourinary: Negative for decreased urine volume and hematuria.  Musculoskeletal: Positive for arthralgias and back pain. Negative for gait problem.  Skin: Negative for pallor and rash.  Allergic/Immunologic: Positive for environmental allergies.  Neurological: Negative for syncope, weakness and headaches.  Hematological: Negative for adenopathy. Does not bruise/bleed easily.  Psychiatric/Behavioral: Negative for confusion. The patient is nervous/anxious.       Current Outpatient Prescriptions on File Prior to Visit  Medication Sig Dispense Refill  . acetaminophen-codeine (TYLENOL #3) 300-30 MG tablet Take 1 tablet by mouth every 4 (four) hours as needed for moderate pain. 90 tablet 0  . albuterol (PROVENTIL HFA;VENTOLIN HFA) 108 (90 Base) MCG/ACT inhaler Inhale 1-2 puffs into the lungs every 6 (six) hours as needed for wheezing or shortness of breath. 1 Inhaler 0  . glycopyrrolate (ROBINUL) 2 MG tablet Take 1 tablet (2 mg total) by mouth 2 (two) times daily.  60 tablet 2  . levocetirizine (XYZAL) 5 MG tablet Take 1 tablet (5 mg total) by mouth every evening. 30 tablet 6  . lisinopril-hydrochlorothiazide (PRINZIDE,ZESTORETIC) 20-12.5 MG tablet Take 1 tablet by mouth daily.    . methocarbamol (ROBAXIN) 500 MG tablet Take 1 tablet (500 mg total) by mouth every 6 (six) hours as needed for muscle spasms. 30  tablet 2  . pantoprazole (PROTONIX) 40 MG tablet Take 1 tablet (40 mg total) by mouth daily. (Patient taking differently: Take 40 mg by mouth daily as needed (reflux). ) 30 tablet 3  . traMADol (ULTRAM) 50 MG tablet Take 1 tablet (50 mg total) by mouth every 6 (six) hours as needed. (Patient taking differently: Take 50 mg by mouth every 6 (six) hours as needed for moderate pain. ) 30 tablet 0   No current facility-administered medications on file prior to visit.      Past Medical History:  Diagnosis Date  . Abnormal EKG 06/2003   History of inverted T waves V1-V3. Normal 2D echo (07/23/2003): LVEF 65%.  . Anemia    BL Hgb 11-12. Ferritin 14 - low normal (08/2007). Colonoscopy 2009 - external hemorrhoids (excellent prep). Last anemia panel (12/2010) - Iron  24, TIBC 269,  B12  316, Folate 11.9, Ferritin 73.  . Asthma   . Degenerative joint disease    BL knees (L>R), lumbar spine. Followed by Sports Medicine, Holly. Jennette Kettle.  . History of multiple pulmonary nodules    Incidental finding: CT Abd/ Pelvis (04/2010) - Several small lower lobe lung nodules, including one pure ground-glass pulmonary nodule measuring 8 mm in the left lower lobe.  Recommend follow-up chest CT (IV contrast preferred) in 6 months to document stability. //  CT Abd/ Pelvis (07/2010) -  3 mm RLL and  8 mm LLL nodule stable.  Other nodules unchanged, likely benign.  . Hyperlipidemia   . Hypertension   . Incarcerated ventral hernia 04/2010   Noted on CT Abd/ pelvis (04/2010). Patient now s/p ventral hernia repair by Holly. Gerrit Friends (12/2010)  . 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 Lower extremity edema    Chronic. 2D echo (2005) - EF 65%.  . Obesity   . Seasonal allergies   . Wears dentures   . Wears glasses    Allergies  Allergen Reactions  . Ketorolac Tromethamine Shortness Of Breath and Palpitations  . Atrovent [Ipratropium] Hives and Rash  . Latex Other (See Comments)    Powdered. Confirm  type of reaction with patient.    Family History  Problem Relation Age of Onset  . Diabetes Mother   . Hypertension Mother   . Stroke Mother   . Dementia Father   . Heart disease Father   . Hypertension Sister   . Diabetes Brother   . Hypertension Brother   . Rashes / Skin problems Maternal Grandfather   . Heart attack Neg Hx     Social History   Social History  . Marital status: Married    Spouse name: N/A  . Number of children: 3  . Years of education: N/A   Social History Main Topics  . Smoking status: Former Smoker    Packs/day: 0.50    Years: 20.00    Types: Cigarettes    Quit date: 09/10/1998  . Smokeless tobacco: Never Used  . Alcohol use No  . Drug use: No  . Sexual activity: Yes    Partners: Male   Other Topics  Concern  . None   Social History Narrative  . None    Vitals:   12/02/16 1205  BP: 132/80  Pulse: 90  Resp: 12   O2 sat at RA 95% Body mass index is 55.31 kg/m.  Physical Exam  Nursing note and vitals reviewed. Constitutional: She is oriented to person, place, and time. She appears well-developed. No distress.  HENT:  Head: Atraumatic.  Mouth/Throat: Oropharynx is clear and moist and mucous membranes are normal.  Enlarged turbinates.  Eyes: Conjunctivae and EOM are normal. Pupils are equal, round, and reactive to light.  Neck: No tracheal deviation present. No thyroid mass and no thyromegaly present.  Cardiovascular: Normal rate and regular rhythm.   No murmur heard. Pulses:      Dorsalis pedis pulses are 2+ on the right side, and 2+ on the left side.  Respiratory: Effort normal. No respiratory distress. She has wheezes. She has no rhonchi. She has no rales.  GI: Soft. She exhibits no mass. There is no hepatomegaly. There is no tenderness.  Musculoskeletal: She exhibits edema (Trace pitting edema LE bilateral).  Lymphadenopathy:    She has no cervical adenopathy.  Neurological: She is alert and oriented to person, place, and time.  She has normal strength. Gait normal.  Skin: Skin is warm. No erythema.  Psychiatric: She has a normal mood and affect.  Adequately groomed, poor eye contact.    ASSESSMENT AND PLAN:   [name] was seen today for transfer.  Diagnoses and all orders for this visit:  Persistent asthma with acute exacerbation, unspecified asthma severity  Also former smoker, ? Combination with COPD. Wheezing improved with Duoneb, no rales or rhonchi appreciated.  Prednisone course recommended,some side effects discussed. Albuterol inh 2 puff every 6 hours for a week then as needed for wheezing or shortness of breath.  Symbicort and Singulair added. Instructed about warning signs. F/U in 2 wekes.  -     ipratropium-albuterol (DUONEB) 0.5-2.5 (3) MG/3ML nebulizer solution 3 mL; Take 3 mLs by nebulization once. -     budesonide-formoterol (SYMBICORT) 160-4.5 MCG/ACT inhaler; Inhale 2 puffs into the lungs 2 (two) times daily. -     montelukast (SINGULAIR) 10 MG tablet; Take 1 tablet (10 mg total) by mouth at bedtime. -     predniSONE (DELTASONE) 20 MG tablet; Take 2 tablets (40 mg total) by mouth daily with breakfast.  B12 deficiency  After verbal consent B12 1000 mcg IM x 1 today and will give another one in 4 weeks.  -     cyanocobalamin ((VITAMIN B-12)) injection 1,000 mcg; Inject 1 mL (1,000 mcg total) into the muscle once.  Class 3 obesity with serious comorbidity and body mass index (BMI) of 50.0 to 59.9 in adult, unspecified obesity type (HCC)  We discussed benefits of wt loss as well as adverse effects of obesity. Consistency with healthy diet and physical activity recommended. Daily brisk walking for 15-30 min as tolerated.  Hyperglycemia  Healthy life style encouraged for prevention of diabetes.  -     POC HgB A1c  Allergic rhinitis, unspecified seasonality, unspecified trigger  Flonase nasal spray and Singulair 10 mg daily. Nasal irrigations with saline as needed. Continue Xyzal 5  mg daily.  -     montelukast (SINGULAIR) 10 MG tablet; Take 1 tablet (10 mg total) by mouth at bedtime. -     fluticasone (FLONASE) 50 MCG/ACT nasal spray; Place 1 spray into both nostrils 2 (two) times daily.  Sleep disorder, unspecified  ?  OSA. Appt with pulmonology will be arranged. Wt loss strongly recommended.  -     Ambulatory referral to Pulmonology  Essential hypertension  Adequately controlled. No changes in current management. DASH-low salt diet recommended. Eye exam recommended annually. F/U in 6 months, before if needed.     Frank Novelo G. [name], MD  [facility]. [place] office.

## 2016-12-06 ENCOUNTER — Other Ambulatory Visit: Payer: Self-pay

## 2016-12-06 DIAGNOSIS — J45901 Unspecified asthma with (acute) exacerbation: Secondary | ICD-10-CM

## 2016-12-06 DIAGNOSIS — J309 Allergic rhinitis, unspecified: Secondary | ICD-10-CM

## 2016-12-06 MED ORDER — LISINOPRIL-HYDROCHLOROTHIAZIDE 20-12.5 MG PO TABS: 1.0000 | ORAL_TABLET | Freq: Every day | ORAL | 1 refills | Status: DC

## 2016-12-06 MED ORDER — MONTELUKAST SODIUM 10 MG PO TABS: 10.0000 mg | ORAL_TABLET | Freq: Every day | ORAL | 1 refills | Status: DC

## 2016-12-06 MED ORDER — BUDESONIDE-FORMOTEROL FUMARATE 160-4.5 MCG/ACT IN AERO: 2.0000 | INHALATION_SPRAY | Freq: Two times a day (BID) | RESPIRATORY_TRACT | 1 refills | Status: DC

## 2016-12-06 MED ORDER — LEVOCETIRIZINE DIHYDROCHLORIDE 5 MG PO TABS: 5.0000 mg | ORAL_TABLET | Freq: Every evening | ORAL | 1 refills | Status: DC

## 2016-12-15 NOTE — Progress Notes (Signed)
HPI:   [name] is a 61 y.o. female, who is here today to follow on recent OV.   She was seen on 12/02/16 when Singulair 10 mg and Symbicort 160-4.5 mcg were added because not well controlled asthma.  She completed Prednisone, no side effects reported. She has not had wheezing in the past week and has not needed Albuterol inh. According to pt, she is "much better."  She is still having mild productive cough with clear sputum, she denies dyspnea or chest pain. She denies hemoptysis. + Former smoker.  Allergic rhinitis: Flonase nasal spray added, Xyzal 5 mg recommended daily.  Symptoms have improved, she is not longer having nasal congestion and rhinorrhea. She can breath better.   Overall she is feeling better, no new symptoms reported. She is trying to eat healthier. She noted wt gain since her last OV.   She has not started exercising yet. Has appt for sleep study 12/22/16.   Review of Systems  Constitutional: Negative for activity change, appetite change, fatigue and fever.  HENT: Positive for postnasal drip. Negative for congestion, mouth sores, sinus pressure, sneezing, sore throat, trouble swallowing and voice change.   Respiratory: Positive for cough. Negative for chest tightness, shortness of breath and wheezing.   Gastrointestinal: Negative for abdominal pain, nausea and vomiting.       No changes in bowel habits.  Skin: Negative for rash.  Allergic/Immunologic: Positive for environmental allergies.  Neurological: Negative for weakness and headaches.  Psychiatric/Behavioral: Negative for confusion and sleep disturbance.     Current Outpatient Prescriptions on File Prior to Visit  Medication Sig Dispense Refill  . acetaminophen-codeine (TYLENOL #3) 300-30 MG tablet Take 1 tablet by mouth every 4 (four) hours as needed for moderate pain. 90 tablet 0  . budesonide-formoterol (SYMBICORT) 160-4.5 MCG/ACT inhaler Inhale 2 puffs into the lungs 2 (two)  times daily. 3 Inhaler 1  . fluticasone (FLONASE) 50 MCG/ACT nasal spray Place 1 spray into both nostrils 2 (two) times daily. 16 g 3  . glycopyrrolate (ROBINUL) 2 MG tablet Take 1 tablet (2 mg total) by mouth 2 (two) times daily. 60 tablet 2  . levocetirizine (XYZAL) 5 MG tablet Take 1 tablet (5 mg total) by mouth every evening. 90 tablet 1  . lisinopril-hydrochlorothiazide (PRINZIDE,ZESTORETIC) 20-12.5 MG tablet Take 1 tablet by mouth daily. 90 tablet 1  . methocarbamol (ROBAXIN) 500 MG tablet Take 1 tablet (500 mg total) by mouth every 6 (six) hours as needed for muscle spasms. 30 tablet 2  . montelukast (SINGULAIR) 10 MG tablet Take 1 tablet (10 mg total) by mouth at bedtime. 90 tablet 1  . pantoprazole (PROTONIX) 40 MG tablet Take 1 tablet (40 mg total) by mouth daily. (Patient taking differently: Take 40 mg by mouth daily as needed (reflux). ) 30 tablet 3  . traMADol (ULTRAM) 50 MG tablet Take 1 tablet (50 mg total) by mouth every 6 (six) hours as needed. (Patient taking differently: Take 50 mg by mouth every 6 (six) hours as needed for moderate pain. ) 30 tablet 0   No current facility-administered medications on file prior to visit.      Past Medical History:  Diagnosis Date  . Abnormal EKG 06/2003   History of inverted T waves V1-V3. Normal 2D echo (07/23/2003): LVEF 65%.  . Anemia    BL Hgb 11-12. Ferritin 14 - low normal (08/2007). Colonoscopy 2009 - external hemorrhoids (excellent prep). Last anemia panel (12/2010) - Iron  24,  TIBC 269,  B12  316, Folate 11.9, Ferritin 73.  . Asthma   . Degenerative joint disease    BL knees (L>R), lumbar spine. Followed by Sports Medicine, Dr. Jennette Kettle.  . History of multiple pulmonary nodules    Incidental finding: CT Abd/ Pelvis (04/2010) - Several small lower lobe lung nodules, including one pure ground-glass pulmonary nodule measuring 8 mm in the left lower lobe.  Recommend follow-up chest CT (IV contrast preferred) in 6 months to document  stability. //  CT Abd/ Pelvis (07/2010) -  3 mm RLL and  8 mm LLL nodule stable.  Other nodules unchanged, likely benign.  . Hyperlipidemia   . Hypertension   . Incarcerated ventral hernia 04/2010   Noted on CT Abd/ pelvis (04/2010). Patient now s/p ventral hernia repair by Dr. Gerrit Friends (12/2010)  . 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 Lower extremity edema    Chronic. 2D echo (2005) - EF 65%.  . Obesity   . Seasonal allergies   . Wears dentures   . Wears glasses    Allergies  Allergen Reactions  . Ketorolac Tromethamine Shortness Of Breath and Palpitations  . Atrovent [Ipratropium] Hives and Rash  . Latex Other (See Comments)    Powdered. Confirm type of reaction with patient.    Social History   Social History  . Marital status: Married    Spouse name: N/A  . Number of children: 3  . Years of education: N/A   Social History Main Topics  . Smoking status: Former Smoker    Packs/day: 0.50    Years: 20.00    Types: Cigarettes    Quit date: 09/10/1998  . Smokeless tobacco: Never Used  . Alcohol use No  . Drug use: No  . Sexual activity: Yes    Partners: Male   Other Topics Concern  . None   Social History Narrative  . None    Vitals:   12/16/16 0916  BP: 130/80  Pulse: 80  Resp: 12  Temp: 97.2 F (36.2 C)  O2 sat at RA 98% Body mass index is 55.98 kg/m.  Wt Readings from Last 3 Encounters:  12/16/16 (!) 336 lb 6 oz (152.6 kg)  12/02/16 (!) 332 lb 6 oz (150.8 kg)  10/12/16 (!) 331 lb (150.1 kg)     Physical Exam  Nursing note and vitals reviewed. Constitutional: She is oriented to person, place, and time. She appears well-developed. She does not appear ill. No distress.  HENT:  Head: Atraumatic.  Nose: Right sinus exhibits no maxillary sinus tenderness and no frontal sinus tenderness. Left sinus exhibits no maxillary sinus tenderness and no frontal sinus tenderness.  Mouth/Throat: Oropharynx is clear and moist and mucous  membranes are normal.  Eyes: Conjunctivae and EOM are normal.  Cardiovascular: Normal rate and regular rhythm.   No murmur heard. Respiratory: Effort normal and breath sounds normal. No respiratory distress.  Musculoskeletal: She exhibits edema (Trace pitting edema LE,bilateral.).  Lymphadenopathy:    She has no cervical adenopathy.  Neurological: She is alert and oriented to person, place, and time. She has normal strength. Gait normal.  Skin: Skin is warm. No erythema.  Psychiatric: She has a normal mood and affect. Her speech is normal.  Well groomed, good eye contact.    ASSESSMENT AND PLAN:    Suzie was seen today for follow-up.  Diagnoses and all orders for this visit:  Persistent asthma, unspecified asthma severity, unspecified whether complicated  Improved. ? Associated COPD, plain Mucinex may help with productive cough. Lung auscultation today negative for rales or rhonchi. Continue Symbicort 160-4 0.5 g twice daily. Continue Singulair 10 mg at bedtime. Weight loss strongly recommended. Albuterol to continue as needed. Instructed about warning signs. Follow up in 4 months.  -     albuterol (PROVENTIL HFA;VENTOLIN HFA) 108 (90 Base) MCG/ACT inhaler; Inhale 1-2 puffs into the lungs every 6 (six) hours as needed for wheezing or shortness of breath.  Allergic rhinitis, unspecified seasonality, unspecified trigger  Improved. Continue daily Flonase nasal spray and Xyzal 5 mg daily. Continue Singulair 10 mg daily. Follow-up in 4 months.  Class 3 obesity with serious comorbidity and body mass index (BMI) of 50.0 to 59.9 in adult, unspecified obesity type (HCC)  She gained about 4 pounds since her last office visit, she attributes this to Prednisone. She is planning on starting exercising in the next few days, we discussed benefits of weight loss as well as adverse side effects.     [name]. [name], MD  [facility]. [place]  office.

## 2016-12-16 ENCOUNTER — Ambulatory Visit (INDEPENDENT_AMBULATORY_CARE_PROVIDER_SITE_OTHER): Payer: Medicare Other | Admitting: Family Medicine

## 2016-12-16 ENCOUNTER — Encounter: Payer: Self-pay | Admitting: Family Medicine

## 2016-12-16 VITALS — BP 130/80 | HR 80 | Temp 97.2°F | Resp 12 | Ht 65.0 in | Wt 336.4 lb

## 2016-12-16 DIAGNOSIS — E669 Obesity, unspecified: Secondary | ICD-10-CM

## 2016-12-16 DIAGNOSIS — IMO0001 Reserved for inherently not codable concepts without codable children: Secondary | ICD-10-CM

## 2016-12-16 DIAGNOSIS — J309 Allergic rhinitis, unspecified: Secondary | ICD-10-CM | POA: Diagnosis not present

## 2016-12-16 DIAGNOSIS — Z6841 Body Mass Index (BMI) 40.0 and over, adult: Secondary | ICD-10-CM

## 2016-12-16 DIAGNOSIS — J45909 Unspecified asthma, uncomplicated: Secondary | ICD-10-CM

## 2016-12-16 MED ORDER — ALBUTEROL SULFATE HFA 108 (90 BASE) MCG/ACT IN AERS: 1.0000 | INHALATION_SPRAY | Freq: Four times a day (QID) | RESPIRATORY_TRACT | 2 refills | Status: DC | PRN

## 2016-12-16 NOTE — Patient Instructions (Signed)
A few things to remember from today's visit:   Persistent asthma, unspecified asthma severity, unspecified whether complicated - Plan: albuterol (PROVENTIL HFA;VENTOLIN HFA) 108 (90 Base) MCG/ACT inhaler  Allergic rhinitis, unspecified seasonality, unspecified trigger  No changes today. Last month prescriptions were sent, 3 months supply.   Please be sure medication list is accurate. If a new problem present, please set up appointment sooner than planned today.

## 2016-12-22 ENCOUNTER — Encounter: Payer: Self-pay | Admitting: Internal Medicine

## 2016-12-22 ENCOUNTER — Ambulatory Visit (INDEPENDENT_AMBULATORY_CARE_PROVIDER_SITE_OTHER): Payer: Medicare Other | Admitting: Internal Medicine

## 2016-12-22 VITALS — BP 120/78 | HR 81 | Ht 65.0 in | Wt 339.4 lb

## 2016-12-22 DIAGNOSIS — G4733 Obstructive sleep apnea (adult) (pediatric): Secondary | ICD-10-CM | POA: Diagnosis not present

## 2016-12-22 DIAGNOSIS — J4520 Mild intermittent asthma, uncomplicated: Secondary | ICD-10-CM | POA: Diagnosis not present

## 2016-12-22 DIAGNOSIS — Z6841 Body Mass Index (BMI) 40.0 and over, adult: Secondary | ICD-10-CM

## 2016-12-22 NOTE — Assessment & Plan Note (Signed)
Tentative diagnosis high probability. No associated parasomnias identified. Her significant obesity is a major contributor. Appropriate discussion of sleep hygiene, responsibility to drive only when she is alert, basics of sleep apnea. Plan-schedule sleep study.

## 2016-12-22 NOTE — Progress Notes (Signed)
12/22/16- 61 year old female former smoker for sleep evaluation. CONSULT SLEEP patient has never had a sleep study and does not have a CPAP machine referring doctor is Dr. Betty [name] patient does sore very loudly. Medical problems include morbid obesity, allergic rhinitis, asthma Husband confirms loud snoring and witnessed apneas. She admits daytime sleepiness not helped by caffeine. ENT surgery-denied. Asthma controlled. Prednisone for exacerbations is blamed for weight gain. She says she has gained over 100 pounds in the last 2 years and considers a fluid retention. Denies history of thyroid disease. No heart problems but has been told she has a murmur. Bedtime variable with short sleep latency, waking 4 times before up at 7:30 AM. [place] 17/24  Prior to Admission medications   Medication Sig Start Date End Date Taking? Authorizing Provider  acetaminophen-codeine (TYLENOL #3) 300-30 MG tablet Take 1 tablet by mouth every 4 (four) hours as needed for moderate pain. 07/05/16  Yes Veryl Speak, FNP  albuterol (PROVENTIL HFA;VENTOLIN HFA) 108 (90 Base) MCG/ACT inhaler Inhale 1-2 puffs into the lungs every 6 (six) hours as needed for wheezing or shortness of breath. 12/16/16  Yes [name], [name], MD  budesonide-formoterol [facility]) 160-4.5 MCG/ACT inhaler Inhale 2 puffs into the lungs 2 (two) times daily. 12/06/16  Yes [name], [name], MD  fluticasone [facility]) 50 MCG/ACT nasal spray Place 1 spray into both nostrils 2 (two) times daily. 12/02/16  Yes [name], [name], MD  glycopyrrolate (ROBINUL) 2 MG tablet Take 1 tablet (2 mg total) by mouth 2 (two) times daily. 08/18/16  Yes Iva Boop, MD  levocetirizine (XYZAL) 5 MG tablet Take 1 tablet (5 mg total) by mouth every evening. 12/06/16  Yes [name], [name], MD  lisinopril-hydrochlorothiazide (PRINZIDE,ZESTORETIC) 20-12.5 MG tablet Take 1 tablet by mouth daily. 12/06/16  Yes [name], [name], MD  methocarbamol (ROBAXIN) 500 MG tablet Take 1 tablet  (500 mg total) by mouth every 6 (six) hours as needed for muscle spasms. 10/27/16  Yes Tarry Kos, MD  montelukast (SINGULAIR) 10 MG tablet Take 1 tablet (10 mg total) by mouth at bedtime. 12/06/16  Yes [name], [name], MD  pantoprazole (PROTONIX) 40 MG tablet Take 1 tablet (40 mg total) by mouth daily. Patient taking differently: Take 40 mg by mouth daily as needed (reflux).  07/05/16  Yes Veryl Speak, FNP  traMADol (ULTRAM) 50 MG tablet Take 1 tablet (50 mg total) by mouth every 6 (six) hours as needed. Patient taking differently: Take 50 mg by mouth every 6 (six) hours as needed for moderate pain.  06/11/16  Yes Earley Favor, NP   Past Medical History:  Diagnosis Date  . Abnormal EKG 06/2003   History of inverted T waves V1-V3. Normal 2D echo (07/23/2003): LVEF 65%.  . Anemia    BL Hgb 11-12. Ferritin 14 - low normal (08/2007). Colonoscopy 2009 - external hemorrhoids (excellent prep). Last anemia panel (12/2010) - Iron  24, TIBC 269,  B12  316, Folate 11.9, Ferritin 73.  . Asthma   . Degenerative joint disease    BL knees (L>R), lumbar spine. Followed by Sports Medicine, Dr. Jennette Kettle.  . History of multiple pulmonary nodules    Incidental finding: CT Abd/ Pelvis (04/2010) - Several small lower lobe lung nodules, including one pure ground-glass pulmonary nodule measuring 8 mm in the left lower lobe.  Recommend follow-up chest CT (IV contrast preferred) in 6 months to document stability. //  CT Abd/ Pelvis (07/2010) -  3 mm RLL and  8 mm LLL nodule stable.  Other nodules unchanged, likely benign.  . Hyperlipidemia   . Hypertension   . Incarcerated ventral hernia 04/2010   Noted on CT Abd/ pelvis (04/2010). Patient now s/p ventral hernia repair by Dr. Gerrit FriendsGerkin (12/2010)  . 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 Lower extremity edema    Chronic. 2D echo (2005) - EF 65%.  . Obesity   . Seasonal allergies   . Wears dentures   . Wears glasses    Past Surgical  History:  Procedure Laterality Date  . COLONOSCOPY  2009  . FOOT SURGERY  2004    left  . INCISION AND DRAINAGE ABSCESS ANAL  07/2008   I&D and debridgement of anorectal abscess  . INCISIONAL HERNIA REPAIR  12/2010   Repair of incarcerated ventral incisional hernia with Ethicon mesh patch - performed by Dr. Gerrit FriendsGerkin.   . INGUINAL HERNIA REPAIR    . JOINT REPLACEMENT Left 2013   left knee  . TOTAL KNEE ARTHROPLASTY  06/16/2011   Left TOTAL KNEE ARTHROPLASTY;  Surgeon: Kennieth RadArthur F Carter;  Location: [facility] OR;  Service: Orthopedics;  Laterality: Left;  left total knee arthroplasty  . TUBAL LIGATION     Family History  Problem Relation Age of Onset  . Diabetes Mother   . Hypertension Mother   . Stroke Mother   . Dementia Father   . Heart disease Father   . Hypertension Sister   . Diabetes Brother   . Hypertension Brother   . Rashes / Skin problems Maternal Grandfather   . Heart attack Neg Hx    Social History   Social History  . Marital status: Married    Spouse name: N/A  . Number of children: 3  . Years of education: N/A   Occupational History  . Not on file.   Social History Main Topics  . Smoking status: Former Smoker    Packs/day: 0.50    Years: 20.00    Types: Cigarettes    Quit date: 09/10/1998  . Smokeless tobacco: Never Used  . Alcohol use No  . Drug use: No  . Sexual activity: Yes    Partners: Male   Other Topics Concern  . Not on file   Social History Narrative  . No narrative on file   ROS-see HPI    "+" = pos Constitutional:    weight loss, night sweats, fevers, chills, fatigue, lassitude. HEENT:    headaches, difficulty swallowing, tooth/dental problems, sore throat,       sneezing, +itching, ear ache, nasal congestion, post nasal drip, snoring CV:    chest pain, orthopnea, PND, swelling in lower extremities, anasarca,                                                 dizziness, palpitations Resp:   +shortness of breath with exertion or at rest.                + productive cough,   non-productive cough, coughing up of blood.              +change in color of mucus.  wheezing.   Skin:    rash or lesions. GI:  No-   heartburn, indigestion, abdominal pain, nausea, vomiting, diarrhea,  change in bowel habits, loss of appetite GU: dysuria, change in color of urine, no urgency or frequency.   flank pain. MS:   +joint pain, stiffness, decreased range of motion, back pain. Neuro-     nothing unusual Psych:  change in mood or affect.  depression or anxiety.   memory loss.  OBJ- Physical Exam General- Alert, Oriented, Affect-appropriate, Distress- none acute, + Morbidly obese Skin- rash-none, lesions- none, excoriation- none Lymphadenopathy- none Head- atraumatic            Eyes- Gross vision intact, PERRLA, conjunctivae and secretions clear            Ears- Hearing, canals-normal            Nose- Clear, no-Septal dev, mucus, polyps, erosion, perforation             Throat- Mallampati II-III , mucosa clear , drainage- none, tonsils- atrophic, + missing teeth Neck- flexible , trachea midline, no stridor , thyroid nl, carotid no bruit Chest - symmetrical excursion , unlabored           Heart/CV- RRR , no murmur heard , no gallop  , no rub, nl s1 s2                           - JVD- none , edema- none, stasis changes- none, varices- none           Lung- clear to P&A, +light cough with deep breath, wheeze- none, cough- none , dullness-none, rub- none           Chest wall-  Abd-  Br/ Gen/ Rectal- Not done, not indicated Extrem- cyanosis- none, clubbing, none, atrophy- none, strength- nl Neuro- grossly intact to observation

## 2016-12-22 NOTE — Assessment & Plan Note (Signed)
She may be an appropriate candidate for bariatric referral

## 2016-12-22 NOTE — Assessment & Plan Note (Signed)
She considers her control adequate at this time, managed by Dr. [place]. We can help if needed.

## 2016-12-22 NOTE — Patient Instructions (Signed)
Order- schedule unattended home sleep test    Dx OSA  Please call us after your sleep study so we know it has been done and we can help make plans going forward.

## 2016-12-29 ENCOUNTER — Encounter (INDEPENDENT_AMBULATORY_CARE_PROVIDER_SITE_OTHER): Payer: Self-pay | Admitting: Family Medicine

## 2017-01-06 DIAGNOSIS — H10413 Chronic giant papillary conjunctivitis, bilateral: Secondary | ICD-10-CM | POA: Diagnosis not present

## 2017-01-19 DIAGNOSIS — G4733 Obstructive sleep apnea (adult) (pediatric): Secondary | ICD-10-CM | POA: Diagnosis not present

## 2017-01-20 ENCOUNTER — Other Ambulatory Visit: Payer: Self-pay | Admitting: *Deleted

## 2017-01-20 DIAGNOSIS — G4733 Obstructive sleep apnea (adult) (pediatric): Secondary | ICD-10-CM

## 2017-01-28 ENCOUNTER — Emergency Department (HOSPITAL_COMMUNITY)
Admission: EM | Admit: 2017-01-28 | Discharge: 2017-01-28 | Disposition: A | Discharge status: Home / Self Care | Payer: Medicare Other | Attending: Emergency Medicine | Admitting: Emergency Medicine

## 2017-01-28 ENCOUNTER — Emergency Department (HOSPITAL_COMMUNITY): Payer: Medicare Other

## 2017-01-28 ENCOUNTER — Encounter (HOSPITAL_COMMUNITY): Payer: Self-pay

## 2017-01-28 DIAGNOSIS — S50811A Abrasion of right forearm, initial encounter: Secondary | ICD-10-CM

## 2017-01-28 DIAGNOSIS — Y929 Unspecified place or not applicable: Secondary | ICD-10-CM | POA: Diagnosis not present

## 2017-01-28 DIAGNOSIS — Y9301 Activity, walking, marching and hiking: Secondary | ICD-10-CM | POA: Diagnosis not present

## 2017-01-28 DIAGNOSIS — Z87891 Personal history of nicotine dependence: Secondary | ICD-10-CM | POA: Diagnosis not present

## 2017-01-28 DIAGNOSIS — S8991XA Unspecified injury of right lower leg, initial encounter: Secondary | ICD-10-CM | POA: Diagnosis present

## 2017-01-28 DIAGNOSIS — J45909 Unspecified asthma, uncomplicated: Secondary | ICD-10-CM | POA: Insufficient documentation

## 2017-01-28 DIAGNOSIS — Z91040 Latex allergy status: Secondary | ICD-10-CM | POA: Insufficient documentation

## 2017-01-28 DIAGNOSIS — S8001XA Contusion of right knee, initial encounter: Secondary | ICD-10-CM

## 2017-01-28 DIAGNOSIS — W010XXA Fall on same level from slipping, tripping and stumbling without subsequent striking against object, initial encounter: Secondary | ICD-10-CM | POA: Insufficient documentation

## 2017-01-28 DIAGNOSIS — Y998 Other external cause status: Secondary | ICD-10-CM | POA: Diagnosis not present

## 2017-01-28 DIAGNOSIS — Z96652 Presence of left artificial knee joint: Secondary | ICD-10-CM | POA: Diagnosis not present

## 2017-01-28 DIAGNOSIS — W19XXXA Unspecified fall, initial encounter: Secondary | ICD-10-CM

## 2017-01-28 DIAGNOSIS — E785 Hyperlipidemia, unspecified: Secondary | ICD-10-CM | POA: Insufficient documentation

## 2017-01-28 DIAGNOSIS — M25561 Pain in right knee: Secondary | ICD-10-CM | POA: Diagnosis not present

## 2017-01-28 DIAGNOSIS — I10 Essential (primary) hypertension: Secondary | ICD-10-CM | POA: Insufficient documentation

## 2017-01-28 MED ORDER — CYCLOBENZAPRINE HCL 10 MG PO TABS: 10.0000 mg | ORAL_TABLET | Freq: Two times a day (BID) | ORAL | 0 refills | Status: DC | PRN

## 2017-01-28 MED ORDER — IBUPROFEN 800 MG PO TABS: 800.0000 mg | ORAL_TABLET | Freq: Three times a day (TID) | ORAL | 0 refills | Status: DC

## 2017-01-28 MED ORDER — IBUPROFEN 800 MG PO TABS
800.0000 mg | ORAL_TABLET | Freq: Once | ORAL | Status: AC
  Administered 2017-01-28: 800 mg via ORAL
  Filled 2017-01-28: qty 1

## 2017-01-28 NOTE — ED Triage Notes (Addendum)
Patient was walking on gravel parking lot and fell onto right side, no loc. Complains of right elbow and right knee pain, NAD. Small abrasion to right arm

## 2017-01-28 NOTE — ED Notes (Signed)
Declined W/C at D/C and was escorted to lobby by RN. 

## 2017-01-28 NOTE — ED Provider Notes (Signed)
[facility]-EMERGENCY DEPT Provider Note   CSN: [id number] Arrival date & time: 01/28/17  1318     History   Chief Complaint Chief Complaint  Patient presents with  . knee/elbow injury    HPI [name] is a 61 y.o. female.  HPI   61 year old female presenting complaining of injury from a recent fall. Patient states prior to arrival, patient was walking with her daughter outside when she may have slipped on loose gravel, fell forward landed on the right side of her body. She denies hitting head or loss of consciousness. She was able to extend her right arm to break the fall but currently complaining of moderate amount of pain to the right thigh and right knee region as well as right forearm. Pain is sharp, moderate in intensity, nonradiating. She has not bear any weight on walk after the fall. She denies any precipitating symptoms prior to the fall. She is not on any blood thinner medication. She is up-to-date with tetanus. No complaints of focal numbness or weakness. No specific treatment tried.  Past Medical History:  Diagnosis Date  . Abnormal EKG 06/2003   History of inverted T waves V1-V3. Normal 2D echo (07/23/2003): LVEF 65%.  . Anemia    BL Hgb 11-12. Ferritin 14 - low normal (08/2007). Colonoscopy 2009 - external hemorrhoids (excellent prep). Last anemia panel (12/2010) - Iron  24, TIBC 269,  B12  316, Folate 11.9, Ferritin 73.  . Asthma   . Degenerative joint disease    BL knees (L>R), lumbar spine. Followed by Sports Medicine, Dr. Jennette KettleNeal.  . History of multiple pulmonary nodules    Incidental finding: CT Abd/ Pelvis (04/2010) - Several small lower lobe lung nodules, including one pure ground-glass pulmonary nodule measuring 8 mm in the left lower lobe.  Recommend follow-up chest CT (IV contrast preferred) in 6 months to document stability. //  CT Abd/ Pelvis (07/2010) -  3 mm RLL and  8 mm LLL nodule stable.  Other nodules unchanged, likely benign.  . Hyperlipidemia   .  Hypertension   . Incarcerated ventral hernia 04/2010   Noted on CT Abd/ pelvis (04/2010). Patient now s/p ventral hernia repair by Dr. Gerrit FriendsGerkin (12/2010)  . 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 Lower extremity edema    Chronic. 2D echo (2005) - EF 65%.  . Obesity   . Seasonal allergies   . Wears dentures   . Wears glasses     Patient Active Problem List   Diagnosis Date Noted  . Obstructive sleep apnea 12/22/2016  . 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 B12 deficiency 09/27/2016  . Hyperlipidemia 04/02/2016  . Class 3 obesity with serious comorbidity and body mass index (BMI) of 50.0 to 59.9 in adult (HCC) 11/21/2011  . Lower abdominal pain 12/30/2010  . Preventative health care 09/10/2010  . LUNG NODULE 05/10/2010  . Low back pain 01/20/2009  . Osteoarthritis 06/02/2008  . HYPERLIPIDEMIA 08/15/2006  . ANEMIA-NOS 08/15/2006  . Essential hypertension 08/11/2006  . Allergic rhinitis 08/11/2006  . Asthma 08/11/2006    Past Surgical History:  Procedure Laterality Date  . COLONOSCOPY  2009  . FOOT SURGERY  2004    left  . INCISION AND DRAINAGE ABSCESS ANAL  07/2008   I&D and debridgement of anorectal abscess  . INCISIONAL HERNIA REPAIR  12/2010   Repair of incarcerated ventral incisional hernia with Ethicon mesh patch - performed by Dr. Gerrit FriendsGerkin.   .[name]  INGUINAL HERNIA REPAIR    . JOINT REPLACEMENT Left 2013   left knee  . TOTAL KNEE ARTHROPLASTY  06/16/2011   Left TOTAL KNEE ARTHROPLASTY;  Surgeon: Kennieth Rad;  Location: [facility] OR;  Service: Orthopedics;  Laterality: Left;  left total knee arthroplasty  . TUBAL LIGATION      OB History    No data available       Home Medications    Prior to Admission medications   Medication Sig Start Date End Date Taking? Authorizing Provider  acetaminophen-codeine (TYLENOL #3) 300-30 MG tablet Take 1 tablet by mouth every 4 (four) hours as needed for moderate pain. 07/05/16    Veryl Speak, FNP  albuterol (PROVENTIL HFA;VENTOLIN HFA) 108 (90 Base) MCG/ACT inhaler Inhale 1-2 puffs into the lungs every 6 (six) hours as needed for wheezing or shortness of breath. 12/16/16   [name], [name], MD  budesonide-formoterol [facility]) 160-4.5 MCG/ACT inhaler Inhale 2 puffs into the lungs 2 (two) times daily. 12/06/16   [name], [name], MD  fluticasone (FLONASE) 50 MCG/ACT nasal spray Place 1 spray into both nostrils 2 (two) times daily. 12/02/16   [name], [name], MD  glycopyrrolate (ROBINUL) 2 MG tablet Take 1 tablet (2 mg total) by mouth 2 (two) times daily. 08/18/16   Iva Boop, MD  levocetirizine (XYZAL) 5 MG tablet Take 1 tablet (5 mg total) by mouth every evening. 12/06/16   [name], [name], MD  lisinopril-hydrochlorothiazide (PRINZIDE,ZESTORETIC) 20-12.5 MG tablet Take 1 tablet by mouth daily. 12/06/16   [name], [name], MD  methocarbamol (ROBAXIN) 500 MG tablet Take 1 tablet (500 mg total) by mouth every 6 (six) hours as needed for muscle spasms. 10/27/16   Tarry Kos, MD  montelukast (SINGULAIR) 10 MG tablet Take 1 tablet (10 mg total) by mouth at bedtime. 12/06/16   [name], [name], MD  pantoprazole (PROTONIX) 40 MG tablet Take 1 tablet (40 mg total) by mouth daily. Patient taking differently: Take 40 mg by mouth daily as needed (reflux).  07/05/16   Veryl Speak, FNP  traMADol (ULTRAM) 50 MG tablet Take 1 tablet (50 mg total) by mouth every 6 (six) hours as needed. Patient taking differently: Take 50 mg by mouth every 6 (six) hours as needed for moderate pain.  06/11/16   Earley Favor, NP    Family History Family History  Problem Relation Age of Onset  . Diabetes Mother   . Hypertension Mother   . Stroke Mother   . Dementia Father   . Heart disease Father   . Hypertension Sister   . Diabetes Brother   . Hypertension Brother   . Rashes / Skin problems Maternal Grandfather   . Heart attack Neg Hx     Social History Social History  Substance Use  Topics  . Smoking status: Former Smoker    Packs/day: 0.50    Years: 20.00    Types: Cigarettes    Quit date: 09/10/1998  . Smokeless tobacco: Never Used  . Alcohol use No     Allergies   Ketorolac tromethamine; Atrovent [ipratropium]; and Latex   Review of Systems Review of Systems  Musculoskeletal: Positive for arthralgias.  Skin: Positive for wound.  Neurological: Negative for numbness and headaches.     Physical Exam Updated Vital Signs BP (!) 168/89 (BP Location: Left Wrist)   Pulse 72   Temp 98 F (36.7 C) (Oral)   Resp 20   LMP 09/09/2009   SpO2 98%  Physical Exam  Constitutional: She appears well-developed and well-nourished. No distress.  Obese female sitting in wheelchair in no acute discomfort.  HENT:  Head: Atraumatic.  Eyes: Conjunctivae are normal.  Neck: Neck supple.  Musculoskeletal: She exhibits tenderness (Right knee: Tenderness to lateral knee with normal knee flexion extension and no joint laxity. Tenderness along right thigh. Right hip is normal.).  No significant midline spine tenderness  Neurological: She is alert.  Able to ambulate with an antalgic gait  Skin: No rash noted.  Mild abrasion noted to anterior right forearm with mild tenderness but no crepitus or deformity noted.  Psychiatric: She has a normal mood and affect.  Nursing note and vitals reviewed.    ED Treatments / Results  Labs (all labs ordered are listed, but only abnormal results are displayed) Labs Reviewed - No data to display  EKG  EKG Interpretation None       Radiology Dg Knee Complete 4 Views Right  Result Date: 01/28/2017 CLINICAL DATA:  Fall today with anterior right knee pain. EXAM: RIGHT KNEE - COMPLETE 4+ VIEW COMPARISON:  03/04/2016 FINDINGS: Exam demonstrates moderate tricompartmental osteoarthritic change. No acute fracture or dislocation. No significant joint effusion. Mild diffuse decreased bone mineralization. IMPRESSION: No acute findings.  Moderate osteoarthritic change. Electronically Signed   By: Elberta Fortis M.D.   On: 01/28/2017 15:22    Procedures Procedures (including critical care time)  Medications Ordered in ED Medications - No data to display   Initial Impression / Assessment and Plan / ED Course  I have reviewed the triage vital signs and the nursing notes.  Pertinent labs & imaging results that were available during my care of the patient were reviewed by me and considered in my medical decision making (see chart for details).     BP (!) 168/89 (BP Location: Left Wrist)   Pulse 72   Temp 98 F (36.7 C) (Oral)   Resp 20   LMP 09/09/2009   SpO2 98%  The patient was noted to be hypertensive today in the emergency department. I have spoken with the patient regarding hypertension and the need for improved management. I instructed the patient to followup with the Primary care doctor within 4 days to improve the management of the patient's hypertension. I also counseled the patient regarding the signs and symptoms which would require an emergent visit to an emergency department for hypertensive urgency and/or hypertensive emergency. The patient understood the need for improved hypertensive management.   Final Clinical Impressions(s) / ED Diagnoses   Final diagnoses:  Fall from standing, initial encounter  Abrasion of right forearm, initial encounter  Contusion of right knee, initial encounter    New Prescriptions New Prescriptions   CYCLOBENZAPRINE (FLEXERIL) 10 MG TABLET    Take 1 tablet (10 mg total) by mouth 2 (two) times daily as needed for muscle spasms.   IBUPROFEN (ADVIL,MOTRIN) 800 MG TABLET    Take 1 tablet (800 mg total) by mouth 3 (three) times daily.   2:45 PM Patient here after a mechanical fall. Her primary complaint is right knee pain and right thigh pain. She also has abrasion to her right forearm but no significant tenderness. Plan to obtain x-ray of right knee for further evaluation.  Ibuprofen given for pain.  3:42 PM Xray of R knee without acute fx/dislocation.  Pt able to ambulate.  RICE therapy discussed.  ACE wrap applied.  Ortho referral given as needed.    Fayrene Helper, PA-C 01/28/17 1542  Arby Barrette, MD 01/28/17 1731

## 2017-01-28 NOTE — ED Notes (Signed)
Pt reports falling from a standing position on gravel/concrete surface. C/o pain right lower leg and right arm. Abrasions noted to both.

## 2017-01-28 NOTE — ED Notes (Signed)
Patient transported to X-ray 

## 2017-01-30 ENCOUNTER — Other Ambulatory Visit: Payer: Self-pay

## 2017-01-30 ENCOUNTER — Encounter (INDEPENDENT_AMBULATORY_CARE_PROVIDER_SITE_OTHER): Payer: Self-pay | Admitting: Orthopaedic Surgery

## 2017-01-30 ENCOUNTER — Ambulatory Visit (INDEPENDENT_AMBULATORY_CARE_PROVIDER_SITE_OTHER): Payer: Medicare Other | Admitting: Orthopaedic Surgery

## 2017-01-30 DIAGNOSIS — M25561 Pain in right knee: Secondary | ICD-10-CM

## 2017-01-30 MED ORDER — TRAMADOL HCL 50 MG PO TABS: 50.0000 mg | ORAL_TABLET | Freq: Three times a day (TID) | ORAL | 0 refills | Status: DC | PRN

## 2017-01-30 NOTE — Progress Notes (Signed)
Office Visit Note   Patient: [name]           Date of Birth: Jun 05, 1956           MRN: [id number] Visit Date: 01/30/2017              Requested by: [place], [name], MD [address] [place], [place] [id number] PCP: [place], [name], MD   Assessment & Plan: Visit Diagnoses:  1. Acute pain of right knee     Plan: Overall impression is right knee contusion to the prepatellar bursa. Recommend warm compresses, NSAIDs as needed. Questions encouraged and answered. Tramadol was prescribed. Follow-up as needed.  Follow-Up Instructions: Return if symptoms worsen or fail to improve.   Orders:  No orders of the defined types were placed in this encounter.  Meds ordered this encounter  Medications  . traMADol (ULTRAM) 50 MG tablet    Sig: Take 1-2 tablets (50-100 mg total) by mouth 3 (three) times daily as needed.    Dispense:  60 tablet    Refill:  0      Procedures: No procedures performed   Clinical Data: No additional findings.   Subjective: Chief Complaint  Patient presents with  . Right Knee - Pain    Patient is a 61 year old female comes in with acute right knee pain from a fall 2 days ago. She is not sure exactly how she injured her knee other than falling. She has taken ibuprofen which does not significantly help. The pain is worse with touching of the knee. Denies any numbness and tingling.    Review of Systems  Constitutional: Negative.   HENT: Negative.   Eyes: Negative.   Respiratory: Negative.   Cardiovascular: Negative.   Endocrine: Negative.   Musculoskeletal: Negative.   Neurological: Negative.   Hematological: Negative.   Psychiatric/Behavioral: Negative.   All other systems reviewed and are negative.    Objective: Vital Signs: LMP 09/09/2009   Physical Exam  Constitutional: She is oriented to person, place, and time. She appears well-developed and well-nourished.  Pulmonary/Chest: Effort normal.  Neurological: She is alert and  oriented to person, place, and time.  Skin: Skin is warm. Capillary refill takes less than 2 seconds.  Psychiatric: She has a normal mood and affect. Her behavior is normal. Judgment and thought content normal.  Nursing note and vitals reviewed.   Ortho Exam Right knee exam shows no joint effusion. Patellar tendon and quad tendon are intact. Collaterals and cruciates are intact. She's very tender to palpation anteriorly over the prepatellar bursa. Specialty Comments:  No specialty comments available.  Imaging: No results found.   PMFS History: Patient Active Problem List   Diagnosis Date Noted  . Obstructive sleep apnea 12/22/2016  . 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 B12 deficiency 09/27/2016  . Hyperlipidemia 04/02/2016  . Class 3 obesity with serious comorbidity and body mass index (BMI) of 50.0 to 59.9 in adult (HCC) 11/21/2011  . Lower abdominal pain 12/30/2010  . Preventative health care 09/10/2010  . LUNG NODULE 05/10/2010  . Low back pain 01/20/2009  . Osteoarthritis 06/02/2008  . HYPERLIPIDEMIA 08/15/2006  . ANEMIA-NOS 08/15/2006  . Essential hypertension 08/11/2006  . Allergic rhinitis 08/11/2006  . Asthma 08/11/2006   Past Medical History:  Diagnosis Date  . Abnormal EKG 06/2003   History of inverted T waves V1-V3. Normal 2D echo (07/23/2003): LVEF 65%.  . Anemia    BL Hgb 11-12. Ferritin 14 -  low normal (08/2007). Colonoscopy 2009 - external hemorrhoids (excellent prep). Last anemia panel (12/2010) - Iron  24, TIBC 269,  B12  316, Folate 11.9, Ferritin 73.  . Asthma   . Degenerative joint disease    BL knees (L>R), lumbar spine. Followed by Sports Medicine, Dr. Jennette Kettle.  . History of multiple pulmonary nodules    Incidental finding: CT Abd/ Pelvis (04/2010) - Several small lower lobe lung nodules, including one pure ground-glass pulmonary nodule measuring 8 mm in the left lower lobe.  Recommend follow-up chest CT (IV contrast preferred) in  6 months to document stability. //  CT Abd/ Pelvis (07/2010) -  3 mm RLL and  8 mm LLL nodule stable.  Other nodules unchanged, likely benign.  . Hyperlipidemia   . Hypertension   . Incarcerated ventral hernia 04/2010   Noted on CT Abd/ pelvis (04/2010). Patient now s/p ventral hernia repair by Dr. Gerrit Friends (12/2010)  . 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 Lower extremity edema    Chronic. 2D echo (2005) - EF 65%.  . Obesity   . Seasonal allergies   . Wears dentures   . Wears glasses     Family History  Problem Relation Age of Onset  . Diabetes Mother   . Hypertension Mother   . Stroke Mother   . Dementia Father   . Heart disease Father   . Hypertension Sister   . Diabetes Brother   . Hypertension Brother   . Rashes / Skin problems Maternal Grandfather   . Heart attack Neg Hx     Past Surgical History:  Procedure Laterality Date  . COLONOSCOPY  2009  . FOOT SURGERY  2004    left  . INCISION AND DRAINAGE ABSCESS ANAL  07/2008   I&D and debridgement of anorectal abscess  . INCISIONAL HERNIA REPAIR  12/2010   Repair of incarcerated ventral incisional hernia with Ethicon mesh patch - performed by Dr. Gerrit Friends.   . INGUINAL HERNIA REPAIR    . JOINT REPLACEMENT Left 2013   left knee  . TOTAL KNEE ARTHROPLASTY  06/16/2011   Left TOTAL KNEE ARTHROPLASTY;  Surgeon: Kennieth Rad;  Location: [facility] OR;  Service: Orthopedics;  Laterality: Left;  left total knee arthroplasty  . TUBAL LIGATION     Social History   Occupational History  . Not on file.   Social History Main Topics  . Smoking status: Former Smoker    Packs/day: 0.50    Years: 20.00    Types: Cigarettes    Quit date: 09/10/1998  . Smokeless tobacco: Never Used  . Alcohol use No  . Drug use: No  . Sexual activity: Yes    Partners: Male

## 2017-01-30 NOTE — Patient Outreach (Signed)
Outreach Patient after ED visit on 01/28/17.  Patient has  follow up appointment scheduled.  She verified her PCP being correct and has her contact information.  [facility] made note of  [facility] Adv[facility]. I explained [facility] but patient does not want to complete engagement tool or have a call back at this time.

## 2017-02-11 IMAGING — US US EXTREM LOW VENOUS*L*
1 series · 13 of 24 positions shown · non-contrast
Comparison: None.

CLINICAL DATA: Pain in the left lower extremity for 1-2 weeks, with
increasing intensity over 2 days.



[Series 1: us extrem low venous*left* · 0.10mm/px · 27 acquisitions, 13 frames shown]
[im 1/27]
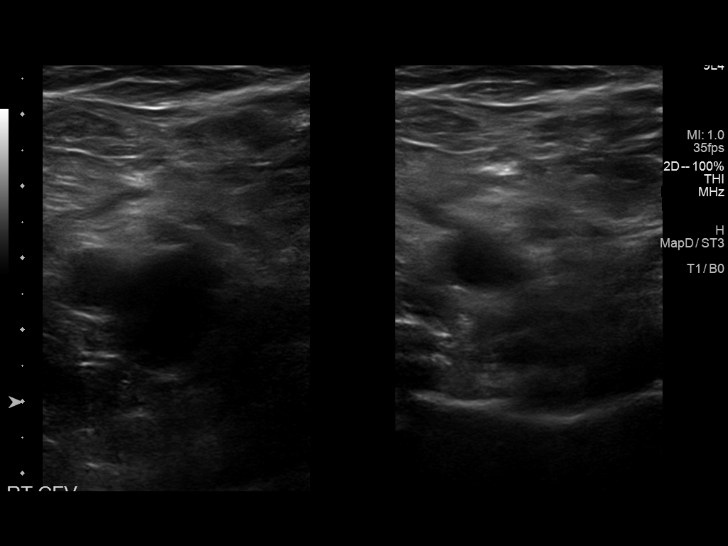
[im 3/27]
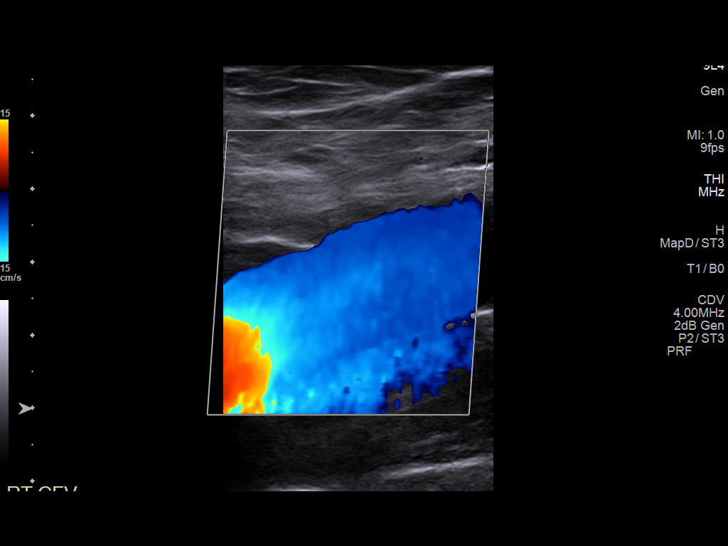
[im 5/27]
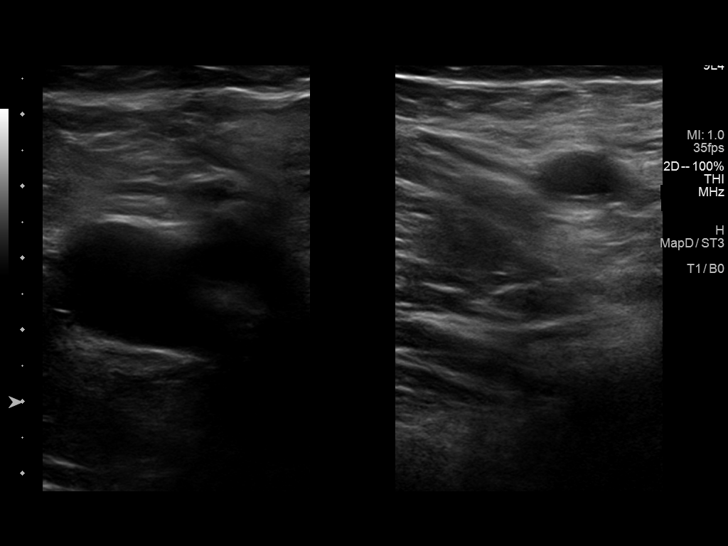
[im 7/27]
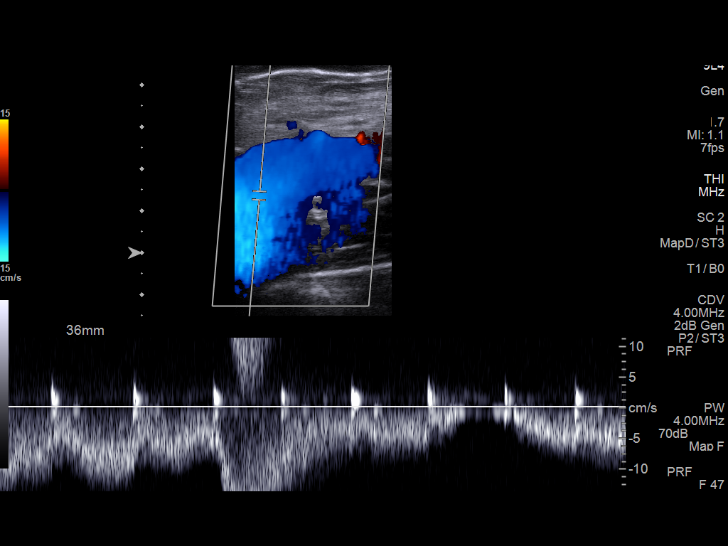
[im 10/27]
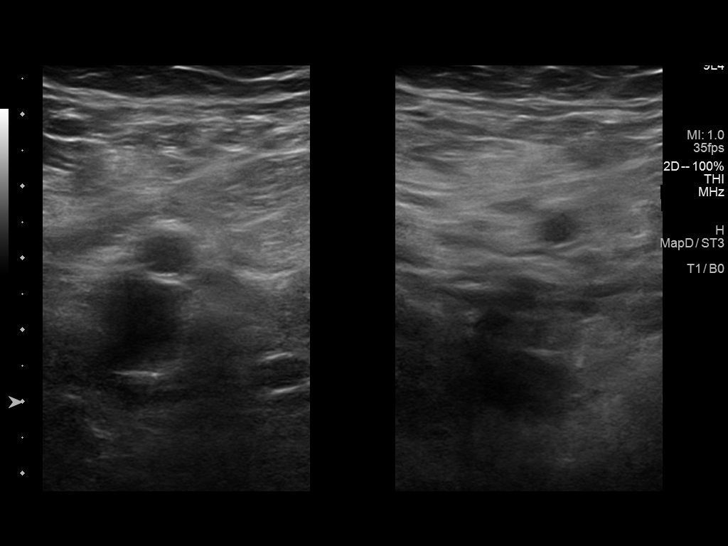
[im 12/27]
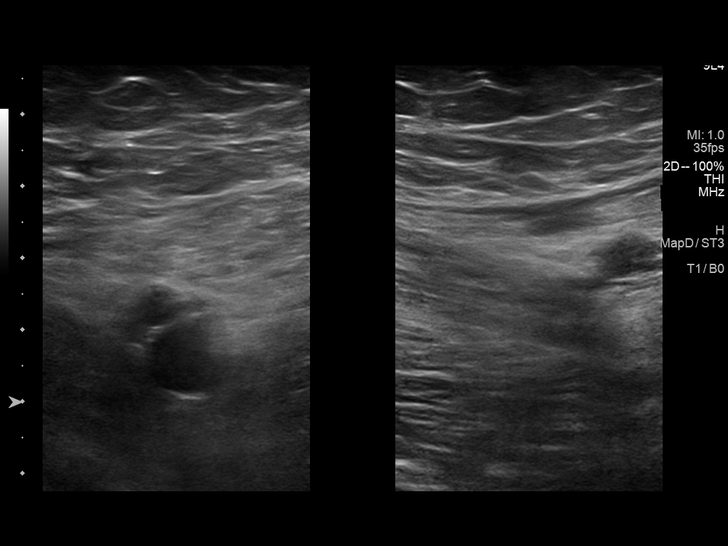
[im 15/27]
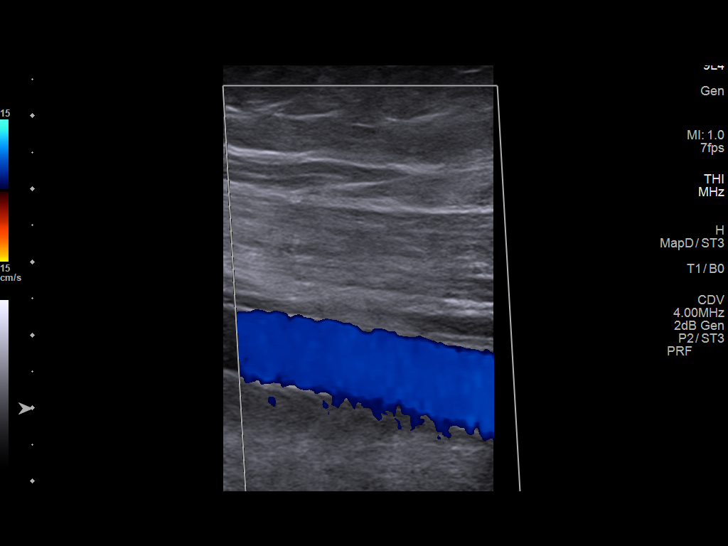
[im 16/27]
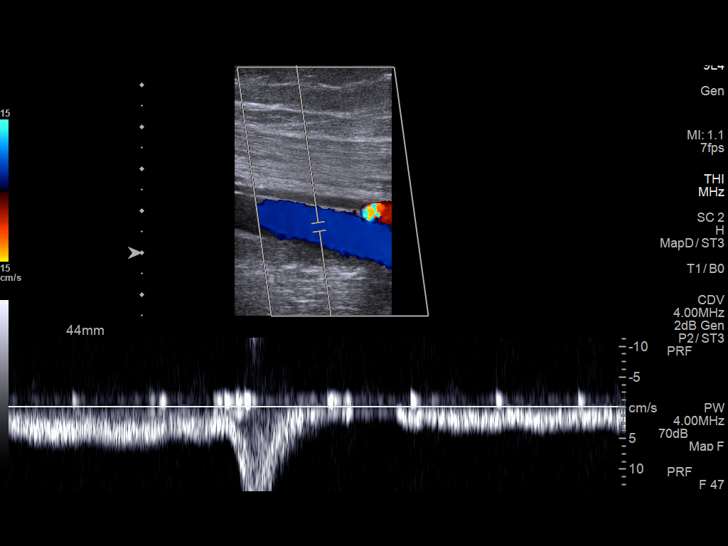
[im 19/27]
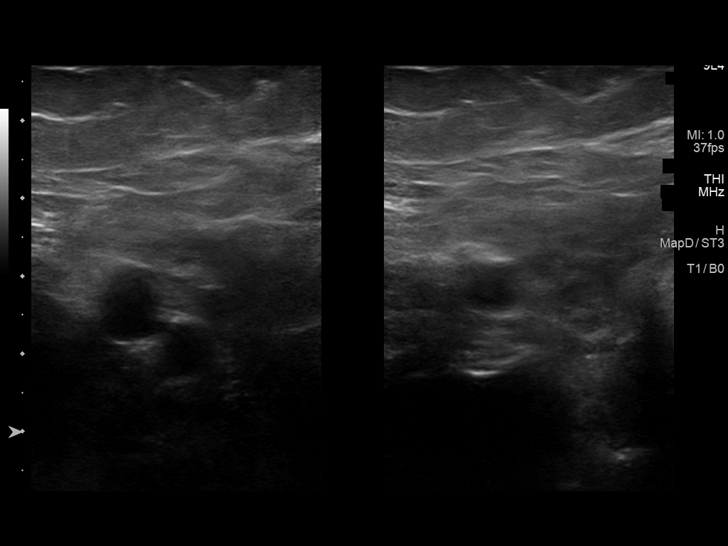
[im 21/27]
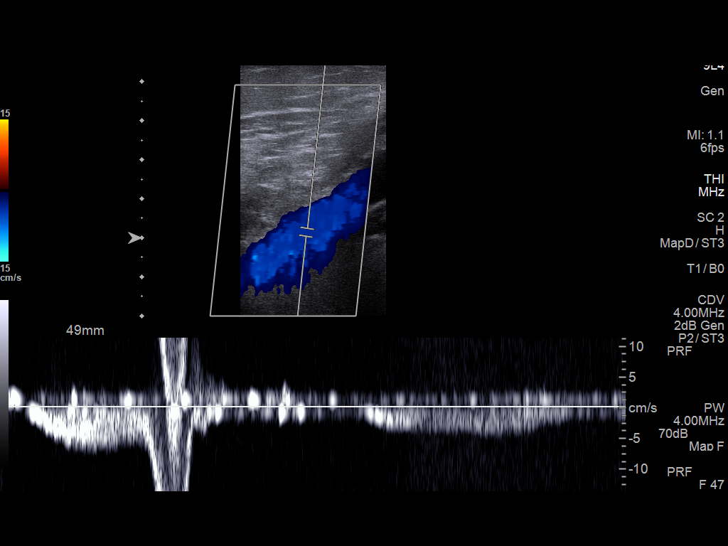
[im 23/27]
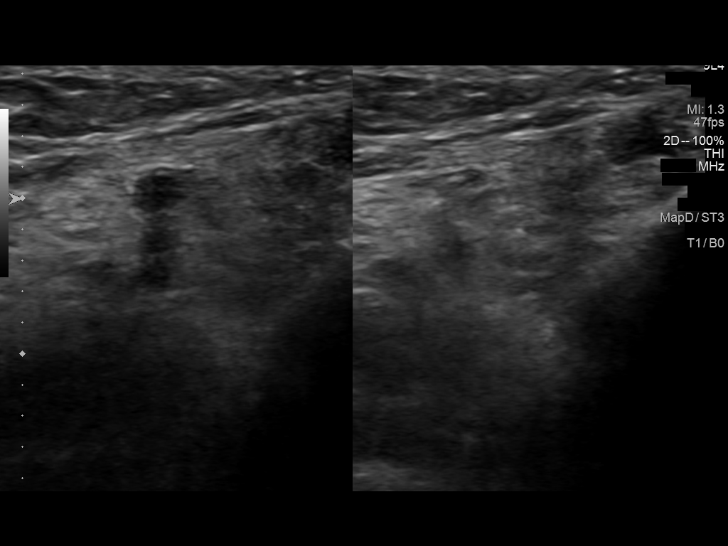
[im 24/27]
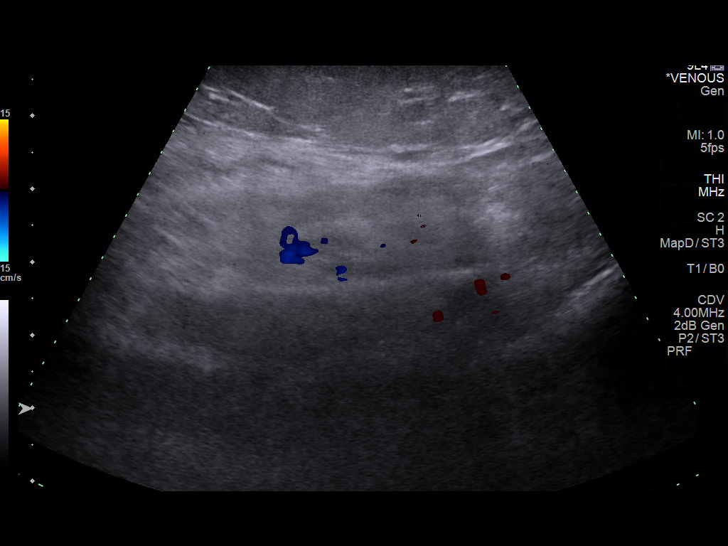
[im 27/27]
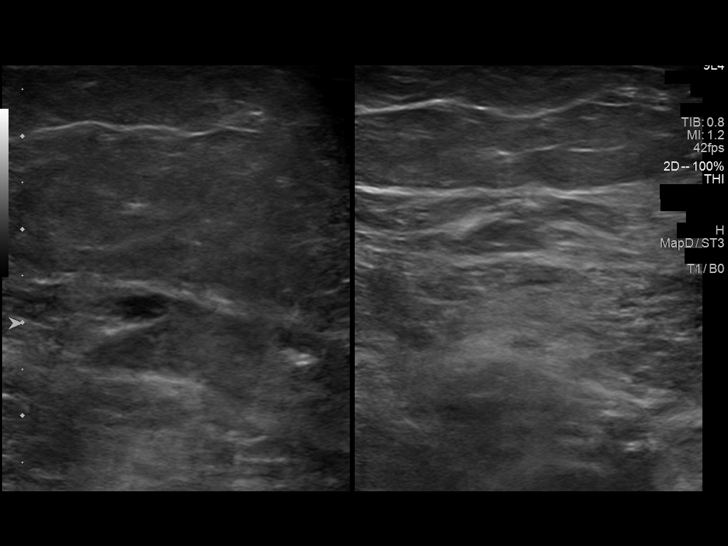

[13 of 24 positions shown; findings below may reference images not displayed]

FINDINGS: Contralateral Common Femoral Vein: Respiratory phasicity is normal
and symmetric with the symptomatic side. No evidence of thrombus.
Normal compressibility.

Common Femoral Vein: No evidence of thrombus. Normal
compressibility, respiratory phasicity and response to augmentation.

Saphenofemoral Junction: No evidence of thrombus.

Profunda Femoral Vein: No evidence of thrombus.

Femoral Vein: No evidence of thrombus.

Popliteal Vein: No evidence of thrombus.

Calf Veins (Limited due to patient body habitus, with
nonvisualization of the peroneal vein): No evidence of thrombus.

Superficial Great Saphenous Vein: No evidence of thrombus.
IMPRESSION: No evidence of left lower extremity deep venous thrombosis.

## 2017-03-19 NOTE — Progress Notes (Signed)
HPI:   [name] is a 61 y.o. female, who is here today for 3 months follow up.   She was last seen on 12/16/16 for f/u on acute visit.  Since her last OV she has Dr Maple HudsonYoung, Hx of OSA and asthma.  Sleep study completed, pending report and follow up.  She was also in the ER on 01/28/17 because fall.    Asthma symptoms have improved, still using Albuterol inh once daily because wheezing, mainly at night.  She is on Symbicort 160-4.5 mcg bid and Singulair 10 mg daily.   She saw Dr Roda ShuttersXu, ortho, on 01/30/17. + Right knee pain, moderate. Pain is eexacerbated with movement, prolonged walking, and standing.    She states that she has been falling frequently, about 4 total since her last OV. She is not sure what is causing falls.  She has Hx of generalized OA.   Hypertension:   Currently on Lisinopril-HCTZ 20-12.5 mg daily. Last eye exam: recently, prescription changed. BP readings at home: Not checking it.   She is following low salt diet.  Dx over 10 years ago.  She is taking medications as instructed, no side effects reported.  She has not noted unusual headache, visual changes, exertional chest pain, or dyspnea. Right ankle edema after fall, worse at the end of the day and stable.   Lab Results  Component Value Date   CREATININE 0.73 09/25/2016   BUN 8 09/25/2016   NA 138 09/25/2016   K 3.7 09/25/2016   CL 107 09/25/2016   CO2 23 09/25/2016   Obesity:  Dietary changes since her last OV: Not consistently. Exercise: Trying to go out a walk, "a little"   Lab Results  Component Value Date   VITAMINB12 180 (L) 08/18/2016   She is not on B12 supplementation.  B12 has been low for about a year.   Sometimes numbness on RLE, around ankle area and for about 1-2 months, tingling and burning sensation. She thinks it is related with edema, denies pain or focal weakness.  01/28/17 knee X ray moderate OA, no fracture.  She has Hx of back pain but has not  had pain lately.   Review of Systems  Constitutional: Positive for fatigue. Negative for activity change, appetite change, fever and unexpected weight change.  HENT: Negative for mouth sores, nosebleeds and trouble swallowing.   Eyes: Negative for redness and visual disturbance.  Respiratory: Positive for wheezing. Negative for cough and shortness of breath.   Cardiovascular: Negative for chest pain, palpitations and leg swelling.  Gastrointestinal: Negative for abdominal pain, nausea and vomiting.       Negative for changes in bowel habits.  Endocrine: Negative for cold intolerance and heat intolerance.  Genitourinary: Negative for decreased urine volume, dysuria and hematuria.  Musculoskeletal: Positive for arthralgias and joint swelling.  Skin: Negative for rash and wound.  Allergic/Immunologic: Positive for environmental allergies.  Neurological: Positive for numbness. Negative for syncope, weakness and headaches.  Psychiatric/Behavioral: Negative for confusion. The patient is nervous/anxious.      Current Outpatient Prescriptions on File Prior to Visit  Medication Sig Dispense Refill  . albuterol (PROVENTIL HFA;VENTOLIN HFA) 108 (90 Base) MCG/ACT inhaler Inhale 1-2 puffs into the lungs every 6 (six) hours as needed for wheezing or shortness of breath. 1 Inhaler 2  . budesonide-formoterol (SYMBICORT) 160-4.5 MCG/ACT inhaler Inhale 2 puffs into the lungs 2 (two) times daily. 3 Inhaler 1  . cyclobenzaprine (FLEXERIL) 10 MG tablet Take  1 tablet (10 mg total) by mouth 2 (two) times daily as needed for muscle spasms. 20 tablet 0  . fluticasone (FLONASE) 50 MCG/ACT nasal spray Place 1 spray into both nostrils 2 (two) times daily. 16 g 3  . glycopyrrolate (ROBINUL) 2 MG tablet Take 1 tablet (2 mg total) by mouth 2 (two) times daily. 60 tablet 2  . levocetirizine (XYZAL) 5 MG tablet Take 1 tablet (5 mg total) by mouth every evening. 90 tablet 1  . methocarbamol (ROBAXIN) 500 MG tablet Take 1  tablet (500 mg total) by mouth every 6 (six) hours as needed for muscle spasms. 30 tablet 2  . montelukast (SINGULAIR) 10 MG tablet Take 1 tablet (10 mg total) by mouth at bedtime. 90 tablet 1  . pantoprazole (PROTONIX) 40 MG tablet Take 1 tablet (40 mg total) by mouth daily. (Patient taking differently: Take 40 mg by mouth daily as needed (reflux). ) 30 tablet 3  . traMADol (ULTRAM) 50 MG tablet Take 1-2 tablets (50-100 mg total) by mouth 3 (three) times daily as needed. 60 tablet 0   No current facility-administered medications on file prior to visit.      Past Medical History:  Diagnosis Date  . Abnormal EKG 06/2003   History of inverted T waves V1-V3. Normal 2D echo (07/23/2003): LVEF 65%.  . Anemia    BL Hgb 11-12. Ferritin 14 - low normal (08/2007). Colonoscopy 2009 - external hemorrhoids (excellent prep). Last anemia panel (12/2010) - Iron  24, TIBC 269,  B12  316, Folate 11.9, Ferritin 73.  . Asthma   . Degenerative joint disease    BL knees (L>R), lumbar spine. Followed by Sports Medicine, Dr. Jennette Kettle.  . History of multiple pulmonary nodules    Incidental finding: CT Abd/ Pelvis (04/2010) - Several small lower lobe lung nodules, including one pure ground-glass pulmonary nodule measuring 8 mm in the left lower lobe.  Recommend follow-up chest CT (IV contrast preferred) in 6 months to document stability. //  CT Abd/ Pelvis (07/2010) -  3 mm RLL and  8 mm LLL nodule stable.  Other nodules unchanged, likely benign.  . Hyperlipidemia   . Hypertension   . Incarcerated ventral hernia 04/2010   Noted on CT Abd/ pelvis (04/2010). Patient now s/p ventral hernia repair by Dr. Gerrit Friends (12/2010)  . 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 Lower extremity edema    Chronic. 2D echo (2005) - EF 65%.  . Obesity   . Seasonal allergies   . Wears dentures   . Wears glasses    Allergies  Allergen Reactions  . Ketorolac Tromethamine Shortness Of Breath and Palpitations    . Atrovent [Ipratropium] Hives and Rash  . Latex Other (See Comments)    Powdered. Confirm type of reaction with patient.    Social History   Social History  . Marital status: Married    Spouse name: N/A  . Number of children: 3  . Years of education: N/A   Social History Main Topics  . Smoking status: Former Smoker    Packs/day: 0.50    Years: 20.00    Types: Cigarettes    Quit date: 09/10/1998  . Smokeless tobacco: Never Used  . Alcohol use No  . Drug use: No  . Sexual activity: Yes    Partners: Male   Other Topics Concern  . None   Social History Narrative  . None    Vitals:   03/20/17 0910  BP: (!) 148/80  Pulse: 90  Resp: 12  SpO2: 96%   Body mass index is 58.1 kg/m.  Wt Readings from Last 3 Encounters:  03/20/17 (!) 349 lb 2 oz (158.4 kg)  12/22/16 (!) 339 lb 6.4 oz (154 kg)  12/16/16 (!) 336 lb 6 oz (152.6 kg)    Physical Exam  Nursing note and vitals reviewed. Constitutional: She is oriented to person, place, and time. She appears well-developed. No distress.  HENT:  Head: Normocephalic and atraumatic.  Mouth/Throat: Oropharynx is clear and moist and mucous membranes are normal.  Poor dentition.  Eyes: Pupils are equal, round, and reactive to light. Conjunctivae are normal.  Cardiovascular: Normal rate and regular rhythm.   No murmur heard. Pulses:      Dorsalis pedis pulses are 2+ on the right side, and 2+ on the left side.  Respiratory: Effort normal and breath sounds normal. No respiratory distress.  GI: Soft. She exhibits no mass. There is no hepatomegaly. There is no tenderness.  Musculoskeletal: She exhibits edema (trace pitting edema LE, bilateral.). She exhibits no tenderness.       Right knee: She exhibits decreased range of motion. She exhibits no deformity and no erythema.  Right knee pain with ROM, limited flexion due to pain, and mild crepitus.   Lymphadenopathy:    She has no cervical adenopathy.  Neurological: She is alert and  oriented to person, place, and time. She has normal strength. Coordination and gait normal.  Skin: Skin is warm. No rash noted. No erythema.  Psychiatric: Her mood appears anxious.  Well groomed, poor eye contact.    ASSESSMENT AND PLAN:   Ms. VAIDEHI BRADDY was seen today for 3 months follow-up.   Diagnoses and all orders for this visit:  Lab Results  Component Value Date   CREATININE 0.73 03/20/2017   BUN 10 03/20/2017   NA 141 03/20/2017   K 4.2 03/20/2017   CL 106 03/20/2017   CO2 24 03/20/2017   Lab Results  Component Value Date   VITAMINB12 >1500 (H) 03/20/2017    Knee injury, right, subsequent encounter  She agrees with trying topical NSAID. Continue following with Dr Roda Shutters if not improving. Wt loss may help.  -     diclofenac sodium (VOLTAREN) 1 % GEL; Apply 4 g topically 4 (four) times daily.  B12 deficiency  After verbal consent B12 1000 mcg IM given today. Further recommendations will be given according to lab results.  -     Vitamin B12 -     cyanocobalamin ((VITAMIN B-12)) injection 1,000 mcg; Inject 1 mL (1,000 mcg total) into the muscle once.  Class 3 severe obesity due to excess calories with serious comorbidity and body mass index (BMI) of 50.0 to 59.9 in adult [facility])  She has gained wt steadily, about 10 Lb sine 12/2016. We discussed benefits of wt loss as well as adverse effects of obesity. Consistency with healthy diet and physical activity recommended. Daily brisk walking for 15-30 min as tolerated.  Essential hypertension  Mildly elevated. HCTZ increased from 12.5 mg to 25 mg. No changes in Lisinopril for now. Monitor BP at home. DASH-low salt diet recommended. Possible complications of elevated BP discussed. Annual eye examination. BP check in 6 weeks and f/u in 3-4 months.  -     lisinopril-hydrochlorothiazide (PRINZIDE,ZESTORETIC) 20-25 MG tablet; Take 1 tablet by mouth daily. -     Basic metabolic panel  Mild intermittent asthma,  unspecified whether complicated  Improved. Still  having episodes of wheezing at night. Other possible causes of wheezing/asthma like symptoms discussed: OHS and COPD. No changes for now. Wt loss strongly recommended. Keep next appt with Dr Maple Hudson. I will see her back in 3-4 months , may consider adding Spiriva or Combivent.  Frequent falls  Fall precautions discussed. She prefers to hold on PT. Risk factors: OA,obesity.  Numbness and tingling of leg  Neurologic examination otherwise normal. Possible causes discussed: neuropathy,radicular,post traumatic among some. Instructed about warning signs. Continue monitoring, no further studies for now.   Need for influenza vaccination -     Flu Vaccine QUAD 36+ mos IM       -Ms. DARIANNE MURALLES was advised to return sooner than planned today if new concerns arise.       [name]. [name], MD  [facility]. [place] office.

## 2017-03-20 ENCOUNTER — Encounter: Payer: Self-pay | Admitting: Family Medicine

## 2017-03-20 ENCOUNTER — Ambulatory Visit (INDEPENDENT_AMBULATORY_CARE_PROVIDER_SITE_OTHER): Payer: Medicare Other | Admitting: Family Medicine

## 2017-03-20 VITALS — BP 148/80 | HR 90 | Resp 12 | Ht 65.0 in | Wt 349.1 lb

## 2017-03-20 DIAGNOSIS — E538 Deficiency of other specified B group vitamins: Secondary | ICD-10-CM

## 2017-03-20 DIAGNOSIS — Z23 Encounter for immunization: Secondary | ICD-10-CM | POA: Diagnosis not present

## 2017-03-20 DIAGNOSIS — R296 Repeated falls: Secondary | ICD-10-CM

## 2017-03-20 DIAGNOSIS — S8991XD Unspecified injury of right lower leg, subsequent encounter: Secondary | ICD-10-CM | POA: Diagnosis not present

## 2017-03-20 DIAGNOSIS — R200 Anesthesia of skin: Secondary | ICD-10-CM | POA: Diagnosis not present

## 2017-03-20 DIAGNOSIS — Z6841 Body Mass Index (BMI) 40.0 and over, adult: Secondary | ICD-10-CM

## 2017-03-20 DIAGNOSIS — I10 Essential (primary) hypertension: Secondary | ICD-10-CM

## 2017-03-20 DIAGNOSIS — R202 Paresthesia of skin: Secondary | ICD-10-CM

## 2017-03-20 DIAGNOSIS — J4520 Mild intermittent asthma, uncomplicated: Secondary | ICD-10-CM

## 2017-03-20 LAB — BASIC METABOLIC PANEL
BUN: 10 mg/dL (ref 6–23)
CO2: 24 mEq/L (ref 19–32)
Calcium: 9.6 mg/dL (ref 8.4–10.5)
Chloride: 106 mEq/L (ref 96–112)
Creatinine, Ser: 0.73 mg/dL (ref 0.40–1.20)
GFR: 104.59 mL/min (ref >60.00)
Glucose, Bld: 100 mg/dL — ABNORMAL HIGH (ref 70–99)
Potassium: 4.2 mEq/L (ref 3.5–5.1)
Sodium: 141 mEq/L (ref 135–145)

## 2017-03-20 LAB — VITAMIN B12: Vitamin B-12: >1500 pg/mL — ABNORMAL HIGH (ref 211–911)

## 2017-03-20 MED ORDER — LISINOPRIL-HYDROCHLOROTHIAZIDE 20-25 MG PO TABS: 1.0000 | ORAL_TABLET | Freq: Every day | ORAL | 1 refills | Status: DC

## 2017-03-20 MED ORDER — CYANOCOBALAMIN 1000 MCG/ML IJ SOLN
1000.0000 ug | Freq: Once | INTRAMUSCULAR | Status: AC
  Administered 2017-03-20: 1000 ug via INTRAMUSCULAR

## 2017-03-20 MED ORDER — DICLOFENAC SODIUM 1 % TD GEL: 4.0000 g | Freq: Four times a day (QID) | TRANSDERMAL | 3 refills | Status: DC

## 2017-03-20 NOTE — Patient Instructions (Addendum)
A few things to remember from today's visit:   B12 deficiency - Plan: Vitamin B12  Class 3 severe obesity due to excess calories with serious comorbidity and body mass index (BMI) of 50.0 to 59.9 in adult [facility])  Essential hypertension - Plan: lisinopril-hydrochlorothiazide (PRINZIDE,ZESTORETIC) 20-25 MG tablet, Basic metabolic panel  Knee injury, right, subsequent encounter - Plan: diclofenac sodium (VOLTAREN) 1 % GEL  Mild intermittent asthma, unspecified whether complicated  Numbness and tingling of leg  Fall Prevention in the Home Falls can cause injuries and can affect people from all age groups. There are many simple things that you can do to make your home safe and to help prevent falls. What can I do on the outside of my home?  Regularly repair the edges of walkways and driveways and fix any cracks.  Remove high doorway thresholds.  Trim any shrubbery on the main path into your home.  Use bright outdoor lighting.  Clear walkways of debris and clutter, including tools and rocks.  Regularly check that handrails are securely fastened and in good repair. Both sides of any steps should have handrails.  Install guardrails along the edges of any raised decks or porches.  Have leaves, snow, and ice cleared regularly.  Use sand or salt on walkways during winter months.  In the garage, clean up any spills right away, including grease or oil spills. What can I do in the bathroom?  Use night lights.  Install grab bars by the toilet and in the tub and shower. Do not use towel bars as grab bars.  Use non-skid mats or decals on the floor of the tub or shower.  If you need to sit down while you are in the shower, use a plastic, non-slip stool.  Keep the floor dry. Immediately clean up any water that spills on the floor.  Remove soap buildup in the tub or shower on a regular basis.  Attach bath mats securely with double-sided non-slip rug tape.  Remove throw rugs and other  tripping hazards from the floor. What can I do in the bedroom?  Use night lights.  Make sure that a bedside light is easy to reach.  Do not use oversized bedding that drapes onto the floor.  Have a firm chair that has side arms to use for getting dressed.  Remove throw rugs and other tripping hazards from the floor. What can I do in the kitchen?  Clean up any spills right away.  Avoid walking on wet floors.  Place frequently used items in easy-to-reach places.  If you need to reach for something above you, use a sturdy step stool that has a grab bar.  Keep electrical cables out of the way.  Do not use floor polish or wax that makes floors slippery. If you have to use wax, make sure that it is non-skid floor wax.  Remove throw rugs and other tripping hazards from the floor. What can I do in the stairways?  Do not leave any items on the stairs.  Make sure that there are handrails on both sides of the stairs. Fix handrails that are broken or loose. Make sure that handrails are as long as the stairways.  Check any carpeting to make sure that it is firmly attached to the stairs. Fix any carpet that is loose or worn.  Avoid having throw rugs at the top or bottom of stairways, or secure the rugs with carpet tape to prevent them from moving.  Make sure that you  have a light switch at the top of the stairs and the bottom of the stairs. If you do not have them, have them installed. What are some other fall prevention tips?  Wear closed-toe shoes that fit well and support your feet. Wear shoes that have rubber soles or low heels.  When you use a stepladder, make sure that it is completely opened and that the sides are firmly locked. Have someone hold the ladder while you are using it. Do not climb a closed stepladder.  Add color or contrast paint or tape to grab bars and handrails in your home. Place contrasting color strips on the first and last steps.  Use mobility aids as  needed, such as canes, walkers, scooters, and crutches.  Turn on lights if it is dark. Replace any light bulbs that burn out.  Set up furniture so that there are clear paths. Keep the furniture in the same spot.  Fix any uneven floor surfaces.  Choose a carpet design that does not hide the edge of steps of a stairway.  Be aware of any and all pets.  Review your medicines with your healthcare provider. Some medicines can cause dizziness or changes in blood pressure, which increase your risk of falling. Talk with your health care provider about other ways that you can decrease your risk of falls. This may include working with a physical therapist or trainer to improve your strength, balance, and endurance. This information is not intended to replace advice given to you by your health care provider. Make sure you discuss any questions you have with your health care provider. Document Released: 06/17/2002 Document Revised: 11/24/2015 Document Reviewed: 08/01/2014 Elsevier Interactive Patient Education  2017 [employer].  Please be sure medication list is accurate. If a new problem present, please set up appointment sooner than planned today.

## 2017-03-23 ENCOUNTER — Encounter: Payer: Self-pay | Admitting: Family Medicine

## 2017-03-24 ENCOUNTER — Other Ambulatory Visit: Payer: Self-pay | Admitting: Family Medicine

## 2017-03-24 DIAGNOSIS — M159 Polyosteoarthritis, unspecified: Secondary | ICD-10-CM

## 2017-03-24 DIAGNOSIS — M8949 Other hypertrophic osteoarthropathy, multiple sites: Secondary | ICD-10-CM

## 2017-03-24 DIAGNOSIS — M150 Primary generalized (osteo)arthritis: Principal | ICD-10-CM

## 2017-03-24 MED ORDER — ACETAMINOPHEN 500 MG PO TABS: 500.0000 mg | ORAL_TABLET | Freq: Four times a day (QID) | ORAL | 1 refills | Status: DC | PRN

## 2017-03-30 ENCOUNTER — Encounter: Payer: Self-pay | Admitting: Family Medicine

## 2017-03-30 ENCOUNTER — Ambulatory Visit (INDEPENDENT_AMBULATORY_CARE_PROVIDER_SITE_OTHER): Payer: Medicare Other | Admitting: Internal Medicine

## 2017-03-30 ENCOUNTER — Encounter: Payer: Self-pay | Admitting: Internal Medicine

## 2017-03-30 VITALS — BP 144/84 | HR 99 | Ht 65.0 in | Wt 350.8 lb

## 2017-03-30 DIAGNOSIS — Z6841 Body Mass Index (BMI) 40.0 and over, adult: Secondary | ICD-10-CM | POA: Diagnosis not present

## 2017-03-30 DIAGNOSIS — G4733 Obstructive sleep apnea (adult) (pediatric): Secondary | ICD-10-CM | POA: Diagnosis not present

## 2017-03-30 DIAGNOSIS — E66813 Obesity, class 3: Secondary | ICD-10-CM

## 2017-03-30 NOTE — Progress Notes (Signed)
12/22/16- 61 year old female former smoker for sleep evaluation. CONSULT SLEEP patient has never had a sleep study and does not have a CPAP machine referring doctor is Dr. Betty [name] patient does snore very loudly. Medical problems include morbid obesity, allergic rhinitis, asthma Husband confirms loud snoring and witnessed apneas. She admits daytime sleepiness not helped by caffeine. ENT surgery-denied. Asthma controlled. Prednisone for exacerbations is blamed for weight gain. She says she has gained over 100 pounds in the last 2 years and considers it fluid retention. Denies history of thyroid disease. No heart problems but has been told she has a murmur. Bedtime variable with short sleep latency, waking 4 times before up at 7:30 AM. [place] 17/24  03/30/17- 61 year old female former smoker followed for OSA, complicated by morbid obesity, allergic rhinitis, asthma Unattended Home Sleep Test 01/19/17-AHI 8.9/hour, desaturation to 92%, body weight 339 pounds FOLLOWS FOR: review HST with patient. We reviewed her sleep study and discussed treatment options aimed at symptoms. We discussed the importance of regaining normal weight. She is going to start CPAP auto. She has multiple missing teeth and I think an oral appliance might aggravate this She notices a little wheezing at times not helped by Symbicort/albuterol rescue inhaler.  ROS-see HPI    "+" = pos Constitutional:    weight loss, night sweats, fevers, chills, fatigue, lassitude. HEENT:    headaches, difficulty swallowing, tooth/dental problems, sore throat,       sneezing, +itching, ear ache, nasal congestion, post nasal drip, snoring CV:    chest pain, orthopnea, PND, swelling in lower extremities, anasarca,                                                 dizziness, palpitations Resp:   +shortness of breath with exertion or at rest.               + productive cough,   non-productive cough, coughing up of blood.              +change in color  of mucus.  + wheezing.   Skin:    rash or lesions. GI:  No-   heartburn, indigestion, abdominal pain, nausea, vomiting, diarrhea,                 change in bowel habits, loss of appetite GU: dysuria, change in color of urine, no urgency or frequency.   flank pain. MS:   +joint pain, stiffness, decreased range of motion, back pain. Neuro-     nothing unusual Psych:  change in mood or affect.  depression or anxiety.   memory loss.  OBJ- Physical Exam General- Alert, Oriented, Affect-appropriate, Distress- none acute, + Morbidly obese Skin- rash-none, lesions- none, excoriation- none Lymphadenopathy- none Head- atraumatic            Eyes- Gross vision intact, PERRLA, conjunctivae and secretions clear            Ears- Hearing, canals-normal            Nose- Clear, no-Septal dev, mucus, polyps, erosion, perforation             Throat- Mallampati II-III , mucosa clear , drainage- none, tonsils- atrophic, + missing teeth Neck- flexible , trachea midline, no stridor , thyroid nl, carotid no bruit Chest - symmetrical excursion , unlabored  Heart/CV- RRR , no murmur heard , no gallop  , no rub, nl s1 s2                           - JVD- none , edema- none, stasis changes- none, varices- none           Lung- clear to P&A, +light cough with deep breath, wheeze- none, cough- none , dullness-none, rub- none           Chest wall-  Abd-  Br/ Gen/ Rectal- Not done, not indicated Extrem- cyanosis- none, clubbing, none, atrophy- none, strength- nl Neuro- grossly intact to observation

## 2017-03-30 NOTE — Patient Instructions (Signed)
Order- new DME, new CPAP auto 5-20, mask of choice, humidifier, supplies, AirView     Dx OSA  Please call as needed   

## 2017-04-02 NOTE — Assessment & Plan Note (Signed)
CPAP is an appropriate initial intervention. Over long term weight loss should be the goal. Plan-initial CPAP auto 5-20

## 2017-04-02 NOTE — Assessment & Plan Note (Signed)
She should be directed towards dietary education and counseling. She may be a bariatric candidate.

## 2017-04-10 ENCOUNTER — Encounter: Payer: Self-pay | Admitting: Internal Medicine

## 2017-04-10 NOTE — Telephone Encounter (Signed)
Patient was seen by CY on 03/30/17 and an order was placed to [facility] for a CPAP machine. Patient reports that she has not heard anything from them. I do not see any documentation of where there was an issue with the order.   Will call [facility] in the morning since they are currently closed.

## 2017-04-12 DIAGNOSIS — I10 Essential (primary) hypertension: Secondary | ICD-10-CM | POA: Diagnosis not present

## 2017-04-12 DIAGNOSIS — G4733 Obstructive sleep apnea (adult) (pediatric): Secondary | ICD-10-CM | POA: Diagnosis not present

## 2017-04-12 DIAGNOSIS — M519 Unspecified thoracic, thoracolumbar and lumbosacral intervertebral disc disorder: Secondary | ICD-10-CM | POA: Diagnosis not present

## 2017-04-12 DIAGNOSIS — M79609 Pain in unspecified limb: Secondary | ICD-10-CM | POA: Diagnosis not present

## 2017-04-19 ENCOUNTER — Telehealth (INDEPENDENT_AMBULATORY_CARE_PROVIDER_SITE_OTHER): Payer: Self-pay | Admitting: Orthopaedic Surgery

## 2017-04-19 NOTE — Telephone Encounter (Signed)
[facility] pharmacy called asking about the patients tramadol. They are only able to prescribe it for 7 days for acute pain, unless its chronic. They were wondering if a new RX could be sent over for her. CB # [phone]

## 2017-04-19 NOTE — Telephone Encounter (Signed)
Please advise thanks.

## 2017-04-19 NOTE — Telephone Encounter (Signed)
sure

## 2017-04-20 MED ORDER — TRAMADOL HCL 50 MG PO TABS: 50.0000 mg | ORAL_TABLET | Freq: Three times a day (TID) | ORAL | 0 refills | Status: DC | PRN

## 2017-04-20 NOTE — Telephone Encounter (Signed)
IC rx to pharm.

## 2017-04-20 NOTE — Addendum Note (Signed)
Addended byPrescott Parma on: 04/20/2017 08:15 AM   Modules accepted: Orders

## 2017-05-02 DIAGNOSIS — M79609 Pain in unspecified limb: Secondary | ICD-10-CM | POA: Diagnosis not present

## 2017-05-02 DIAGNOSIS — M519 Unspecified thoracic, thoracolumbar and lumbosacral intervertebral disc disorder: Secondary | ICD-10-CM | POA: Diagnosis not present

## 2017-05-02 DIAGNOSIS — G4733 Obstructive sleep apnea (adult) (pediatric): Secondary | ICD-10-CM | POA: Diagnosis not present

## 2017-05-02 DIAGNOSIS — I10 Essential (primary) hypertension: Secondary | ICD-10-CM | POA: Diagnosis not present

## 2017-05-13 DIAGNOSIS — G4733 Obstructive sleep apnea (adult) (pediatric): Secondary | ICD-10-CM | POA: Diagnosis not present

## 2017-06-12 DIAGNOSIS — G4733 Obstructive sleep apnea (adult) (pediatric): Secondary | ICD-10-CM | POA: Diagnosis not present

## 2017-06-19 ENCOUNTER — Ambulatory Visit: Payer: Medicare Other | Admitting: Family Medicine

## 2017-07-07 ENCOUNTER — Ambulatory Visit (INDEPENDENT_AMBULATORY_CARE_PROVIDER_SITE_OTHER): Payer: Medicare Other | Admitting: Internal Medicine

## 2017-07-07 ENCOUNTER — Encounter: Payer: Self-pay | Admitting: Internal Medicine

## 2017-07-07 VITALS — BP 134/82 | HR 78 | Ht 65.0 in | Wt 360.6 lb

## 2017-07-07 DIAGNOSIS — J45901 Unspecified asthma with (acute) exacerbation: Secondary | ICD-10-CM

## 2017-07-07 DIAGNOSIS — G4733 Obstructive sleep apnea (adult) (pediatric): Secondary | ICD-10-CM | POA: Diagnosis not present

## 2017-07-07 DIAGNOSIS — J4521 Mild intermittent asthma with (acute) exacerbation: Secondary | ICD-10-CM | POA: Diagnosis not present

## 2017-07-07 MED ORDER — METHYLPREDNISOLONE ACETATE 80 MG/ML IJ SUSP
80.0000 mg | Freq: Once | INTRAMUSCULAR | Status: AC
  Administered 2017-07-07: 80 mg via INTRAMUSCULAR

## 2017-07-07 MED ORDER — LEVALBUTEROL HCL 0.63 MG/3ML IN NEBU
0.6300 mg | INHALATION_SOLUTION | Freq: Once | RESPIRATORY_TRACT | Status: AC
  Administered 2017-07-07: 0.63 mg via RESPIRATORY_TRACT

## 2017-07-07 MED ORDER — AZITHROMYCIN 250 MG PO TABS
ORAL_TABLET | ORAL | 0 refills | Status: DC
Start: 2017-07-07 — End: 2017-09-15

## 2017-07-07 NOTE — Progress Notes (Signed)
HPI female former smoker followed for OSA, complicated by morbid obesity, allergic rhinitis, asthma Unattended Home Sleep Test 01/19/17-AHI 8.9/hour, desaturation to 92%, body weight 339 pounds  --------------------------------------------------------------------------------------------- 03/30/17- 61 year old female former smoker followed for OSA, complicated by morbid obesity, allergic rhinitis, asthma Unattended Home Sleep Test 01/19/17-AHI 8.9/hour, desaturation to 92%, body weight 339 pounds FOLLOWS FOR: review HST with patient. We reviewed her sleep study and discussed treatment options aimed at symptoms. We discussed the importance of regaining normal weight. She is going to start CPAP auto. She has multiple missing teeth and I think an oral appliance might aggravate this She notices a little wheezing at times not helped by Symbicort/albuterol rescue inhaler.  07/07/17-61 year old female former smoker followed for OSA, complicated by morbid obesity, allergic rhinitis, asthma CPAP auto 5-20/[facility] ---follow up for CPAP. Uses [facility]. States that her mask has not fitting correctly since she has been sick.  Download 73% compliance, AHI 1.2/hour.  Pressure is averaging between 8.7-14.8.  She has not worn CPAP in the last few days because of bad cold with active cough which is aggravated her asthma.  Denies fever but says sputum is green.  Had been sleeping better with CPAP before she got sick.  ROS-see HPI    "+" = pos Constitutional:    weight loss, night sweats, fevers, chills, fatigue, lassitude. HEENT:    headaches, difficulty swallowing, tooth/dental problems, sore throat,       sneezing, +itching, ear ache, nasal congestion, post nasal drip, snoring CV:    chest pain, orthopnea, PND, swelling in lower extremities, anasarca,                                                 dizziness, palpitations Resp:   +shortness of breath with exertion or at rest.               + productive cough,    non-productive cough, coughing up of blood.              +change in color of mucus.  + wheezing.   Skin:    rash or lesions. GI:  No-   heartburn, indigestion, abdominal pain, nausea, vomiting, diarrhea,                 change in bowel habits, loss of appetite GU: dysuria, change in color of urine, no urgency or frequency.   flank pain. MS:   +joint pain, stiffness, decreased range of motion, back pain. Neuro-     nothing unusual Psych:  change in mood or affect.  depression or anxiety.   memory loss.  OBJ- Physical Exam General- Alert, Oriented, Affect-appropriate, Distress- none acute, + Morbidly obese, + mask Skin- rash-none, lesions- none, excoriation- none Lymphadenopathy- none Head- atraumatic            Eyes- Gross vision intact, PERRLA, conjunctivae and secretions clear            Ears- Hearing, canals-normal            Nose- Clear, no-Septal dev, mucus, polyps, erosion, perforation             Throat- Mallampati II-III , mucosa clear , drainage- none, tonsils- atrophic, + missing teeth Neck- flexible , trachea midline, no stridor , thyroid nl, carotid no bruit Chest - symmetrical excursion , unlabored  Heart/CV- RRR , no murmur heard , no gallop  , no rub, nl s1 s2                           - JVD- none , edema- none, stasis changes- none, varices- none           Lung-  + wheezy cough,  dullness-none, rub- none           Chest wall-  Abd-  Br/ Gen/ Rectal- Not done, not indicated Extrem- cyanosis- none, clubbing, none, atrophy- none, strength- nl Neuro- grossly intact to observation

## 2017-07-07 NOTE — Patient Instructions (Signed)
We can continue CPAP auto 5-20, mask of choice, humidifier, supplies, AirView   Dx OSA  Order- neb xop 0.63    Dx asthma exacerbation              Depo 80   Script sent for Z pak antibiotic

## 2017-07-07 NOTE — Assessment & Plan Note (Signed)
Asthmatic bronchitis-acute exacerbation consistent with viral respiratory infection trigger. Plan-nebulizer treatments Xopenex, Depo-Medrol, Z-Pak

## 2017-07-07 NOTE — Assessment & Plan Note (Signed)
She had made a fairly good start with CPAP demonstrating satisfactory compliance and excellent control.  There are routine comfort issues which we reviewed and we discussed compliance goals.  We can continue auto 5-20 for now.

## 2017-07-13 DIAGNOSIS — G4733 Obstructive sleep apnea (adult) (pediatric): Secondary | ICD-10-CM | POA: Diagnosis not present

## 2017-07-17 DIAGNOSIS — M79609 Pain in unspecified limb: Secondary | ICD-10-CM | POA: Diagnosis not present

## 2017-07-17 DIAGNOSIS — G4733 Obstructive sleep apnea (adult) (pediatric): Secondary | ICD-10-CM | POA: Diagnosis not present

## 2017-07-17 DIAGNOSIS — M519 Unspecified thoracic, thoracolumbar and lumbosacral intervertebral disc disorder: Secondary | ICD-10-CM | POA: Diagnosis not present

## 2017-07-17 DIAGNOSIS — I10 Essential (primary) hypertension: Secondary | ICD-10-CM | POA: Diagnosis not present

## 2017-08-13 DIAGNOSIS — G4733 Obstructive sleep apnea (adult) (pediatric): Secondary | ICD-10-CM | POA: Diagnosis not present

## 2017-09-10 DIAGNOSIS — G4733 Obstructive sleep apnea (adult) (pediatric): Secondary | ICD-10-CM | POA: Diagnosis not present

## 2017-09-14 NOTE — Progress Notes (Signed)
HPI:  Chief Complaint  Patient presents with  . Follow-up    boil on back for 2 weeks that is painful    [name] is a 62 y.o. female, who is here today for 6 months follow up.   She was last seen on 03/20/17.  Since her last OV she has followed with Dr Maple HudsonYoung (pulmonologist) and Dr Roda ShuttersXu (ortho).  Asthma: Currently she is on Symbicort 160-4.5 mcg twice daily and albuterol inhaler as needed. She usually needs Albuterol inhaler about twice per week for wheezing. Currently she is not having cough, dyspnea, or wheezing.  OSA, she has not worn her CPAP for the past couple weeks.  HTN: Dx 10+ years ago.  Currently she is on Lisinopril-HCTZ 20-25 mg daily.  Lab Results  Component Value Date   CREATININE 0.73 03/20/2017   BUN 10 03/20/2017   NA 141 03/20/2017   K 4.2 03/20/2017   CL 106 03/20/2017   CO2 24 03/20/2017    Denies severe/frequent headache, visual changes, chest pain, dyspnea, palpitation,or focal weakness. Last eye exam within a year ago.    Back "wound": About 2 weeks ago she started with tender area on lower back, a couple days ago she noted drainage on shirt. She has been applying warm compresses. Pain is severe, burning sensation, exacerbated by leaning back when sitting or when lying in bed.  No Hx of trauma.  She has no noted fever but has some chills. A few weeks ago she had a "boil" in right axilla, it resolved after lesion was drained.   Requesting refill on Methocarbamol, which is helping with muscle spasms. Medications was fist prescribed by Dr Roda ShuttersXu in 10/2016. She has had intermittent muscles spasm on back. Exacerbated by movement.  Tolerating medication well.   Review of Systems  Constitutional: Positive for fatigue. Negative for activity change, appetite change and fever.  HENT: Negative for mouth sores, nosebleeds and sore throat.   Eyes: Negative for redness and visual disturbance.  Respiratory: Negative for cough,  shortness of breath and wheezing.   Cardiovascular: Negative for chest pain, palpitations and leg swelling.  Gastrointestinal: Negative for abdominal pain, nausea and vomiting.       Negative for changes in bowel habits.  Genitourinary: Negative for decreased urine volume, dysuria and hematuria.  Musculoskeletal: Positive for arthralgias and back pain.  Skin: Positive for wound. Negative for rash.  Neurological: Negative for syncope, weakness and headaches.  Psychiatric/Behavioral: Positive for sleep disturbance. Negative for confusion. The patient is nervous/anxious.      Current Outpatient Medications on File Prior to Visit  Medication Sig Dispense Refill  . acetaminophen (TYLENOL) 500 MG tablet Take 1 tablet (500 mg total) by mouth every 6 (six) hours as needed. 120 tablet 1  . albuterol (PROVENTIL HFA;VENTOLIN HFA) 108 (90 Base) MCG/ACT inhaler Inhale 1-2 puffs into the lungs every 6 (six) hours as needed for wheezing or shortness of breath. 1 Inhaler 2  . budesonide-formoterol (SYMBICORT) 160-4.5 MCG/ACT inhaler Inhale 2 puffs into the lungs 2 (two) times daily. 3 Inhaler 1  . diclofenac sodium (VOLTAREN) 1 % GEL Apply 4 g topically 4 (four) times daily. 4 Tube 3  . fluticasone (FLONASE) 50 MCG/ACT nasal spray Place 1 spray into both nostrils 2 (two) times daily. 16 g 3  . glycopyrrolate (ROBINUL) 2 MG tablet Take 1 tablet (2 mg total) by mouth 2 (two) times daily. 60 tablet 2  . levocetirizine (XYZAL) 5 MG tablet Take  1 tablet (5 mg total) by mouth every evening. 90 tablet 1  . lisinopril-hydrochlorothiazide (PRINZIDE,ZESTORETIC) 20-25 MG tablet Take 1 tablet by mouth daily. 90 tablet 1  . montelukast (SINGULAIR) 10 MG tablet Take 1 tablet (10 mg total) by mouth at bedtime. 90 tablet 1  . pantoprazole (PROTONIX) 40 MG tablet Take 1 tablet (40 mg total) by mouth daily. (Patient taking differently: Take 40 mg by mouth daily as needed (reflux). ) 30 tablet 3  . traMADol (ULTRAM) 50 MG  tablet Take 1-2 tablets (50-100 mg total) by mouth 3 (three) times daily as needed. 60 tablet 0   No current facility-administered medications on file prior to visit.      Past Medical History:  Diagnosis Date  . Abnormal EKG 06/2003   History of inverted T waves V1-V3. Normal 2D echo (07/23/2003): LVEF 65%.  . Anemia    BL Hgb 11-12. Ferritin 14 - low normal (08/2007). Colonoscopy 2009 - external hemorrhoids (excellent prep). Last anemia panel (12/2010) - Iron  24, TIBC 269,  B12  316, Folate 11.9, Ferritin 73.  . Asthma   . Degenerative joint disease    BL knees (L>R), lumbar spine. Followed by Sports Medicine, Dr. Jennette Kettle.  . History of multiple pulmonary nodules    Incidental finding: CT Abd/ Pelvis (04/2010) - Several small lower lobe lung nodules, including one pure ground-glass pulmonary nodule measuring 8 mm in the left lower lobe.  Recommend follow-up chest CT (IV contrast preferred) in 6 months to document stability. //  CT Abd/ Pelvis (07/2010) -  3 mm RLL and  8 mm LLL nodule stable.  Other nodules unchanged, likely benign.  . Hyperlipidemia   . Hypertension   . Incarcerated ventral hernia 04/2010   Noted on CT Abd/ pelvis (04/2010). Patient now s/p ventral hernia repair by Dr. Gerrit Friends (12/2010)  . 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 Lower extremity edema    Chronic. 2D echo (2005) - EF 65%.  . Obesity   . Seasonal allergies   . Wears dentures   . Wears glasses    Allergies  Allergen Reactions  . Ketorolac Tromethamine Shortness Of Breath and Palpitations  . Atrovent [Ipratropium] Hives and Rash  . Latex Other (See Comments)    Powdered. Confirm type of reaction with patient.    Social History   Socioeconomic History  . Marital status: Married    Spouse name: None  . Number of children: 3  . Years of education: None  . Highest education level: None  Social Needs  . Financial resource strain: None  . Food insecurity - worry: None    . Food insecurity - inability: None  . Transportation needs - medical: None  . Transportation needs - non-medical: None  Occupational History  . None  Tobacco Use  . Smoking status: Former Smoker    Packs/day: 0.50    Years: 20.00    Pack years: 10.00    Types: Cigarettes    Last attempt to quit: 09/10/1998    Years since quitting: 19.0  . Smokeless tobacco: Never Used  Substance and Sexual Activity  . Alcohol use: No    Alcohol/week: 0.0 oz  . Drug use: No  . Sexual activity: Yes    Partners: Male  Other Topics Concern  . None  Social History Narrative  . None    Vitals:   09/15/17 1452  BP: 132/84  Pulse: 75  Resp: 12  Temp: 98.4  F (36.9 C)  SpO2: 99%   Body mass index is 59.95 kg/m.    Physical Exam  Nursing note and vitals reviewed. Constitutional: She is oriented to person, place, and time. She appears well-developed. No distress.  HENT:  Head: Normocephalic and atraumatic.  Mouth/Throat: Oropharynx is clear and moist and mucous membranes are normal.  Eyes: Conjunctivae are normal. Pupils are equal, round, and reactive to light.  Cardiovascular: Normal rate and regular rhythm.  No murmur heard. Respiratory: Effort normal and breath sounds normal. No respiratory distress.  GI: Soft. She exhibits no mass. There is no tenderness.  Musculoskeletal: She exhibits edema (Trace pitting LE edema, bilateral.) and tenderness.  Lymphadenopathy:    She has no cervical adenopathy.  Neurological: She is alert and oriented to person, place, and time. She has normal strength. Coordination normal.  Skin: Skin is warm. No erythema.     On right lower back there is a indurated area, about 5-6 cm with wound actively draining purulent discharge. Local heat and tenderness upon palpation.  Psychiatric: She has a normal mood and affect.  Well groomed, good eye contact.      ASSESSMENT AND PLAN:   [name] was seen today for 6 months follow-up.  Orders  Placed This Encounter  Procedures  . Basic metabolic panel    Abscess of lower back  Small amount of purulent material drained upon applying pressure.Wound cleaned with alcohol, abx oint ap[plied, and covered with bandage. Continue warm compresses. Bactrim DS bid for 7 days. F/U in 7-10 days.   Essential hypertension Adequately controlled. No changes in current management. Low salt diet recommended. Eye exam recommended annually. F/U in 6 months, before if needed.   Asthma In general problem has improved and is now better controlled. No changes in current management. Follow-up in 6 months.  Obstructive sleep apnea Encouraged to wear CPAP machine. Continue following with Dr. Maple Hudson.  Low back pain Methocarbamol 500 mg refills, recommend taking it tid prn. Some side effects discussed. She is following with Dr Roda Shutters.     -[name] was advised to return sooner than planned today if new concerns arise.       Chase Arnall G. [name], MD  [facility]. [place] office.

## 2017-09-15 ENCOUNTER — Encounter: Payer: Self-pay | Admitting: Family Medicine

## 2017-09-15 ENCOUNTER — Ambulatory Visit (INDEPENDENT_AMBULATORY_CARE_PROVIDER_SITE_OTHER): Payer: Medicare Other | Admitting: Family Medicine

## 2017-09-15 VITALS — BP 132/84 | HR 75 | Temp 98.4°F | Resp 12 | Ht 65.0 in | Wt 360.2 lb

## 2017-09-15 DIAGNOSIS — G8929 Other chronic pain: Secondary | ICD-10-CM | POA: Diagnosis not present

## 2017-09-15 DIAGNOSIS — I10 Essential (primary) hypertension: Secondary | ICD-10-CM

## 2017-09-15 DIAGNOSIS — L02212 Cutaneous abscess of back [any part, except buttock]: Secondary | ICD-10-CM | POA: Diagnosis not present

## 2017-09-15 DIAGNOSIS — J4521 Mild intermittent asthma with (acute) exacerbation: Secondary | ICD-10-CM

## 2017-09-15 DIAGNOSIS — M545 Low back pain: Secondary | ICD-10-CM

## 2017-09-15 DIAGNOSIS — G4733 Obstructive sleep apnea (adult) (pediatric): Secondary | ICD-10-CM

## 2017-09-15 MED ORDER — SULFAMETHOXAZOLE-TRIMETHOPRIM 800-160 MG PO TABS: 1.0000 | ORAL_TABLET | Freq: Two times a day (BID) | ORAL | 0 refills | Status: AC

## 2017-09-15 MED ORDER — METHOCARBAMOL 500 MG PO TABS: 500.0000 mg | ORAL_TABLET | Freq: Three times a day (TID) | ORAL | 2 refills | Status: DC | PRN

## 2017-09-15 NOTE — Assessment & Plan Note (Signed)
Methocarbamol 500 mg refills, recommend taking it tid prn. Some side effects discussed. She is following with Dr Roda ShuttersXu.

## 2017-09-15 NOTE — Assessment & Plan Note (Signed)
Adequately controlled. No changes in current management. Low salt diet recommended. Eye exam recommended annually. F/U in 6 months, before if needed.  

## 2017-09-15 NOTE — Assessment & Plan Note (Signed)
In general problem has improved and is now better controlled. No changes in current management. Follow-up in 6 months.

## 2017-09-15 NOTE — Patient Instructions (Addendum)
A few things to remember from today's visit:   Mild intermittent asthma with acute exacerbation  Essential hypertension - Plan: Basic metabolic panel  Abscess of lower back - Plan: sulfamethoxazole-trimethoprim (BACTRIM DS,SEPTRA DS) 800-160 MG tablet    Skin Abscess A skin abscess is an infected area on or under your skin that contains pus and other material. An abscess can happen almost anywhere on your body. Some abscesses break open (rupture) on their own. Most continue to get worse unless they are treated. The infection can spread deeper into the body and into your blood, which can make you feel sick. Treatment usually involves draining the abscess. Follow these instructions at home: Abscess Care  If you have an abscess that has not drained, place a warm, clean, wet washcloth over the abscess several times a day. Do this as told by your doctor.  Follow instructions from your doctor about how to take care of your abscess. Make sure you: ? Cover the abscess with a bandage (dressing). ? Change your bandage or gauze as told by your doctor. ? Wash your hands with soap and water before you change the bandage or gauze. If you cannot use soap and water, use hand sanitizer.  Check your abscess every day for signs that the infection is getting worse. Check for: ? More redness, swelling, or pain. ? More fluid or blood. ? Warmth. ? More pus or a bad smell. Medicines   Take over-the-counter and prescription medicines only as told by your doctor.  If you were prescribed an antibiotic medicine, take it as told by your doctor. Do not stop taking the antibiotic even if you start to feel better. General instructions  To avoid spreading the infection: ? Do not share personal care items, towels, or hot tubs with others. ? Avoid making skin-to-skin contact with other people.  Keep all follow-up visits as told by your doctor. This is important. Contact a doctor if:  You have more redness,  swelling, or pain around your abscess.  You have more fluid or blood coming from your abscess.  Your abscess feels warm when you touch it.  You have more pus or a bad smell coming from your abscess.  You have a fever.  Your muscles ache.  You have chills.  You feel sick. Get help right away if:  You have very bad (severe) pain.  You see red streaks on your skin spreading away from the abscess. This information is not intended to replace advice given to you by your health care provider. Make sure you discuss any questions you have with your health care provider. Document Released: 12/14/2007 Document Revised: 02/21/2016 Document Reviewed: 05/06/2015 Elsevier Interactive Patient Education  2018 [employer]  Please be sure medication list is accurate. If a new problem present, please set up appointment sooner than planned today.

## 2017-09-15 NOTE — Assessment & Plan Note (Signed)
Encouraged to wear CPAP machine. Continue following with Dr. Maple HudsonYoung.

## 2017-09-16 ENCOUNTER — Encounter: Payer: Self-pay | Admitting: Family Medicine

## 2017-09-16 LAB — BASIC METABOLIC PANEL
BUN: 11 mg/dL (ref 7–25)
CO2: 25 mmol/L (ref 20–32)
Calcium: 9.5 mg/dL (ref 8.6–10.4)
Chloride: 109 mmol/L (ref 98–110)
Creat: 0.74 mg/dL (ref 0.50–0.99)
Glucose, Bld: 83 mg/dL (ref 65–99)
Potassium: 4.4 mmol/L (ref 3.5–5.3)
Sodium: 142 mmol/L (ref 135–146)

## 2017-09-21 ENCOUNTER — Encounter: Payer: Self-pay | Admitting: Internal Medicine

## 2017-09-22 ENCOUNTER — Ambulatory Visit (INDEPENDENT_AMBULATORY_CARE_PROVIDER_SITE_OTHER): Payer: Medicare Other | Admitting: Family Medicine

## 2017-09-22 ENCOUNTER — Encounter: Payer: Self-pay | Admitting: Family Medicine

## 2017-09-22 VITALS — BP 130/72 | HR 84 | Temp 98.8°F | Resp 12 | Ht 65.0 in | Wt 359.5 lb

## 2017-09-22 DIAGNOSIS — Z1211 Encounter for screening for malignant neoplasm of colon: Secondary | ICD-10-CM | POA: Diagnosis not present

## 2017-09-22 DIAGNOSIS — L0292 Furuncle, unspecified: Secondary | ICD-10-CM

## 2017-09-22 MED ORDER — DOXYCYCLINE HYCLATE 100 MG PO TABS: 100.0000 mg | ORAL_TABLET | Freq: Two times a day (BID) | ORAL | 0 refills | Status: AC

## 2017-09-22 NOTE — Patient Instructions (Signed)
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  This is a free benefit under Medicare that may help us find additional ways to help you. Some highlights are reviewing medications, lifestyle, and doing a dementia screen.   Please be sure medication list is accurate. If a new problem present, please set up appointment sooner than planned today.

## 2017-09-22 NOTE — Progress Notes (Signed)
HPI:   [name] is a 61 y.o. female, who is here today to follow on recent OV.   She was seen on 09/15/17 when she started abx treatment for back abscess. She has 3 days left of Bactrim DS, which she has tolerated well, no side effects reported.  Lesion has improved but she has noted new lesions on right arm and lower back. Lesions are tender, the one on her arm drained some. Pain is exacerbated by rubbing against clothes.  She has not had fever, some chills.   She is also due for her colonoscopy, has not received appt information yet. Last colonoscopy in 09/2007.    Review of Systems  Constitutional: Positive for chills and fatigue (no moe than usual). Negative for appetite change and fever.  HENT: Negative for mouth sores and sore throat.   Eyes: Negative for discharge and redness.  Respiratory: Negative for shortness of breath and wheezing.   Gastrointestinal: Negative for abdominal pain, diarrhea, nausea and vomiting.  Musculoskeletal: Positive for back pain (chronic). Negative for gait problem.  Skin: Positive for rash.  Allergic/Immunologic: Positive for environmental allergies.  Neurological: Negative for syncope, weakness and headaches.      Current Outpatient Medications on File Prior to Visit  Medication Sig Dispense Refill  . acetaminophen (TYLENOL) 500 MG tablet Take 1 tablet (500 mg total) by mouth every 6 (six) hours as needed. 120 tablet 1  . albuterol (PROVENTIL HFA;VENTOLIN HFA) 108 (90 Base) MCG/ACT inhaler Inhale 1-2 puffs into the lungs every 6 (six) hours as needed for wheezing or shortness of breath. 1 Inhaler 2  . budesonide-formoterol (SYMBICORT) 160-4.5 MCG/ACT inhaler Inhale 2 puffs into the lungs 2 (two) times daily. 3 Inhaler 1  . diclofenac sodium (VOLTAREN) 1 % GEL Apply 4 g topically 4 (four) times daily. 4 Tube 3  . fluticasone (FLONASE) 50 MCG/ACT nasal spray Place 1 spray into both nostrils 2 (two) times daily. 16 g 3  .  glycopyrrolate (ROBINUL) 2 MG tablet Take 1 tablet (2 mg total) by mouth 2 (two) times daily. 60 tablet 2  . levocetirizine (XYZAL) 5 MG tablet Take 1 tablet (5 mg total) by mouth every evening. 90 tablet 1  . lisinopril-hydrochlorothiazide (PRINZIDE,ZESTORETIC) 20-25 MG tablet Take 1 tablet by mouth daily. 90 tablet 1  . methocarbamol (ROBAXIN) 500 MG tablet Take 1 tablet (500 mg total) by mouth every 8 (eight) hours as needed for muscle spasms. 45 tablet 2  . montelukast (SINGULAIR) 10 MG tablet Take 1 tablet (10 mg total) by mouth at bedtime. 90 tablet 1  . pantoprazole (PROTONIX) 40 MG tablet Take 1 tablet (40 mg total) by mouth daily. (Patient taking differently: Take 40 mg by mouth daily as needed (reflux). ) 30 tablet 3  . traMADol (ULTRAM) 50 MG tablet Take 1-2 tablets (50-100 mg total) by mouth 3 (three) times daily as needed. 60 tablet 0   No current facility-administered medications on file prior to visit.      Past Medical History:  Diagnosis Date  . Abnormal EKG 06/2003   History of inverted T waves V1-V3. Normal 2D echo (07/23/2003): LVEF 65%.  . Anemia    BL Hgb 11-12. Ferritin 14 - low normal (08/2007). Colonoscopy 2009 - external hemorrhoids (excellent prep). Last anemia panel (12/2010) - Iron  24, TIBC 269,  B12  316, Folate 11.9, Ferritin 73.  . Asthma   . Degenerative joint disease    BL knees (L>R), lumbar spine.  Followed by Sports Medicine, [name].  . History of multiple pulmonary nodules    Incidental finding: CT Abd/ Pelvis (04/2010) - Several small lower lobe lung nodules, including one pure ground-glass pulmonary nodule measuring 8 mm in the left lower lobe.  Recommend follow-up chest CT (IV contrast preferred) in 6 months to document stability. //  CT Abd/ Pelvis (07/2010) -  3 mm RLL and  8 mm LLL nodule stable.  Other nodules unchanged, likely benign.  . Hyperlipidemia   . Hypertension   . Incarcerated ventral hernia 04/2010   Noted on CT Abd/ pelvis  (04/2010). Patient now s/p ventral hernia repair by [name] (12/2010)  . 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 Lower extremity edema    Chronic. 2D echo (2005) - EF 65%.  . Obesity   . Seasonal allergies   . Wears dentures   . Wears glasses    Allergies  Allergen Reactions  . Ketorolac Tromethamine Shortness Of Breath and Palpitations  . Atrovent [Ipratropium] Hives and Rash  . Latex Other (See Comments)    Powdered. Confirm type of reaction with patient.    Social History   Socioeconomic History  . Marital status: Married    Spouse name: None  . Number of children: 3  . Years of education: None  . Highest education level: None  Social Needs  . Financial resource strain: None  . Food insecurity - worry: None  . Food insecurity - inability: None  . Transportation needs - medical: None  . Transportation needs - non-medical: None  Occupational History  . None  Tobacco Use  . Smoking status: Former Smoker    Packs/day: 0.50    Years: 20.00    Pack years: 10.00    Types: Cigarettes    Last attempt to quit: 09/10/1998    Years since quitting: 19.0  . Smokeless tobacco: Never Used  Substance and Sexual Activity  . Alcohol use: No    Alcohol/week: 0.0 oz  . Drug use: No  . Sexual activity: Yes    Partners: Male  Other Topics Concern  . None  Social History Narrative  . None    Vitals:   09/22/17 1447  BP: 130/72  Pulse: 84  Resp: 12  Temp: 98.8 F (37.1 C)  SpO2: 98%   Body mass index is 59.82 kg/m.    Physical Exam  Nursing note and vitals reviewed. Constitutional: She is oriented to person, place, and time. She appears well-developed. No distress.  HENT:  Head: Normocephalic and atraumatic.  Mouth/Throat: Oropharynx is clear and moist and mucous membranes are normal.  Eyes: Conjunctivae are normal.  Cardiovascular: Normal rate and regular rhythm.  Respiratory: Effort normal and breath sounds normal. No respiratory  distress.  Musculoskeletal: She exhibits no edema.  Lymphadenopathy:    She has no cervical adenopathy.       Right axillary: No pectoral and no lateral adenopathy present.  Neurological: She is alert and oriented to person, place, and time.  Skin: Skin is warm. Rash noted. Rash is pustular.     Right lower back with about 1.5 cm wound,no erythema or induration. No tenderness and no drainage.  2 new lesions, about 2-2.5 cm each one. Tender with palpation, no fluctuant area. Right arm, a few cm from axillae with 1.5 cm,tender nodular lesion. Pustula in the center.  Psychiatric: She has a normal mood and affect. Her speech is normal.  Well groomed, good eye  contact.    ASSESSMENT AND PLAN:  [name] was seen today for follow-up.  Diagnoses and all orders for this visit:  Forunculosis -     doxycycline (VIBRA-TABS) 100 MG tablet; Take 1 tablet (100 mg total) by mouth 2 (two) times daily.  Colon cancer screening -     Ambulatory referral to Gastroenterology   Because recurrent skin abscess, recommend Doxycycline for 3-4 weeks. Wt loss may help. Also recommend scrubbing skin while showering a few times per week. Side effects of abx discussed. Keep next f/u appt. Instructed about warning signs.   Holly G. [name], MD  [facility]. [place] office.

## 2017-09-26 NOTE — Progress Notes (Signed)
Subjective:   [name] is a 62 y.o. female who presents for Medicare Annual (Subsequent) preventive examination.  Reports health as  Last OV 09/22/2017 Has htn  Diet More mobile when she lost weight before Over a year; states legs started bothering her, particular left knee TRK replacement "is out of place" per her report  Feels like she would like to lose weight  Mother is in hospice and she is caring for her 10:30 to 8:30  1. Will return to the gym; pick something easier on her knee 2. With volteran she is more mobile Typical day; takes care of mother; she is bedridden  In Hospice care 6 brothers and sisters and 3 are assisting with mother but most of the care giving is falling on her   Wt 359lb  BMI 59 BMI 60 today and challenged by weight gain over the last year  Started crying in the office today   Exercise On hold  Has sharp pain in left side  Jabbing pain; feels like an 8  Happens sporadically, started crying spontaneously and pain resolved. Tried to get her an apt today, but had to reschedule for another day prior to Friday   Cardiac Risk Factors include: advanced age (>4355men, 40>65 women)   Health Maintenance Due  Topic Date Due  . PAP SMEAR  03/30/2015  . COLONOSCOPY  09/25/2017   Colonoscopy  Due 09/2017 - Dr. Leone PayorGessner  Mammogram 09/2016 - due as well  < 65; dexa not due  Pap smears; states it is time  Her plan is to go [facility] to get her mammogram and will schedule her PAP there  May postpone colonoscopy due to her care giving role but plans to have one this year      Objective:     Vitals: BP 130/80   Pulse 73   Ht 5\' 5"  (1.651 m)   Wt (!) 363 lb 8 oz (164.9 kg)   LMP 09/09/2009   SpO2 97%   BMI 60.49 kg/m   Body mass index is 60.49 kg/m.  Advanced Directives 09/27/2017 01/28/2017 09/25/2016 08/18/2016 06/10/2016 04/02/2016 12/04/2014  Does Patient Have a Medical Advance Directive? No No No No No No No  Would patient like information  on creating a medical advance directive? - No - Patient declined - No - Patient declined - - No - patient declined information    Advanced Directive; Reviewed advanced directive and agreed to receipt of information and discussion.  Focused face to face x  20 minutes discussing HCPOA and Living will and reviewed all the questions in the [facility] forms. The patient voices understanding of HCPOA; LW reviewed and information provided on each question. Educated on how to revoke this HCPOA or LW at any time.   Also  discussed life prolonging measures (given a few examples) and where she could choose to initiate or not;  the ability to given the HCPOA power to change her living will or not if she cannot speak for herself; as well as finalizing the will by 2 unrelated witnesses and notary.  Will call for questions and given information on [facility] pastoral department for further assistance.     Tobacco Social History   Tobacco Use  Smoking Status Former Smoker  . Packs/day: 0.50  . Years: 20.00  . Pack years: 10.00  . Types: Cigarettes  . Last attempt to quit: 09/10/1998  . Years since quitting: 19.0  Smokeless Tobacco Never Used  Tobacco Comment   62 yo      Counseling given: Yes Comment: 62 yo    Clinical Intake:   Past Medical History:  Diagnosis Date  . Abnormal EKG 06/2003   History of inverted T waves V1-V3. Normal 2D echo (07/23/2003): LVEF 65%.  . Anemia    BL Hgb 11-12. Ferritin 14 - low normal (08/2007). Colonoscopy 2009 - external hemorrhoids (excellent prep). Last anemia panel (12/2010) - Iron  24, TIBC 269,  B12  316, Folate 11.9, Ferritin 73.  . Asthma   . Degenerative joint disease    BL knees (L>R), lumbar spine. Followed by Sports Medicine, Dr. Jennette Kettle.  . History of multiple pulmonary nodules    Incidental finding: CT Abd/ Pelvis (04/2010) - Several small lower lobe lung nodules, including one pure ground-glass pulmonary nodule measuring 8 mm in the left lower lobe.   Recommend follow-up chest CT (IV contrast preferred) in 6 months to document stability. //  CT Abd/ Pelvis (07/2010) -  3 mm RLL and  8 mm LLL nodule stable.  Other nodules unchanged, likely benign.  . Hyperlipidemia   . Hypertension   . Incarcerated ventral hernia 04/2010   Noted on CT Abd/ pelvis (04/2010). Patient now s/p ventral hernia repair by Dr. Gerrit Friends (12/2010)  . 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 Lower extremity edema    Chronic. 2D echo (2005) - EF 65%.  . Obesity   . Seasonal allergies   . Wears dentures   . Wears glasses    Past Surgical History:  Procedure Laterality Date  . COLONOSCOPY  2009  . FOOT SURGERY  2004    left  . INCISION AND DRAINAGE ABSCESS ANAL  07/2008   I&D and debridgement of anorectal abscess  . INCISIONAL HERNIA REPAIR  12/2010   Repair of incarcerated ventral incisional hernia with Ethicon mesh patch - performed by Dr. Gerrit Friends.   . INGUINAL HERNIA REPAIR    . JOINT REPLACEMENT Left 2013   left knee  . TOTAL KNEE ARTHROPLASTY  06/16/2011   Left TOTAL KNEE ARTHROPLASTY;  Surgeon: Kennieth Rad;  Location: [facility] OR;  Service: Orthopedics;  Laterality: Left;  left total knee arthroplasty  . TUBAL LIGATION     Family History  Problem Relation Age of Onset  . Diabetes Mother   . Hypertension Mother   . Stroke Mother   . Dementia Father   . Heart disease Father   . Hypertension Sister   . Diabetes Brother   . Hypertension Brother   . Rashes / Skin problems Maternal Grandfather   . Heart attack Neg Hx    Social History   Socioeconomic History  . Marital status: Married    Spouse name: Not on file  . Number of children: 3  . Years of education: Not on file  . Highest education level: Not on file  Social Needs  . Financial resource strain: Not on file  . Food insecurity - worry: Not on file  . Food insecurity - inability: Not on file  . Transportation needs - medical: Not on file  . Transportation needs -  non-medical: Not on file  Occupational History  . Not on file  Tobacco Use  . Smoking status: Former Smoker    Packs/day: 0.50    Years: 20.00    Pack years: 10.00    Types: Cigarettes    Last attempt to quit: 09/10/1998    Years since quitting: 19.0  .  Smokeless tobacco: Never Used  . Tobacco comment: 62 yo   Substance and Sexual Activity  . Alcohol use: No    Alcohol/week: 0.0 oz  . Drug use: No  . Sexual activity: Yes    Partners: Male  Other Topics Concern  . Not on file  Social History Narrative  . Not on file    Outpatient Encounter Medications as of 09/27/2017  Medication Sig  . acetaminophen (TYLENOL) 500 MG tablet Take 1 tablet (500 mg total) by mouth every 6 (six) hours as needed.  [name] albuterol (PROVENTIL HFA;VENTOLIN HFA) 108 (90 Base) MCG/ACT inhaler Inhale 1-2 puffs into the lungs every 6 (six) hours as needed for wheezing or shortness of breath.  . budesonide-formoterol (SYMBICORT) 160-4.5 MCG/ACT inhaler Inhale 2 puffs into the lungs 2 (two) times daily.  . diclofenac sodium (VOLTAREN) 1 % GEL Apply 4 g topically 4 (four) times daily.  . fluticasone (FLONASE) 50 MCG/ACT nasal spray Place 1 spray into both nostrils 2 (two) times daily.  [name] levocetirizine (XYZAL) 5 MG tablet Take 1 tablet (5 mg total) by mouth every evening.  [name] lisinopril-hydrochlorothiazide (PRINZIDE,ZESTORETIC) 20-25 MG tablet Take 1 tablet by mouth daily.  . methocarbamol (ROBAXIN) 500 MG tablet Take 1 tablet (500 mg total) by mouth every 8 (eight) hours as needed for muscle spasms.  . montelukast (SINGULAIR) 10 MG tablet Take 1 tablet (10 mg total) by mouth at bedtime.  . pantoprazole (PROTONIX) 40 MG tablet Take 1 tablet (40 mg total) by mouth daily. (Patient taking differently: Take 40 mg by mouth daily as needed (reflux). )  . traMADol (ULTRAM) 50 MG tablet Take 1-2 tablets (50-100 mg total) by mouth 3 (three) times daily as needed.  . doxycycline (VIBRA-TABS) 100 MG tablet Take 1 tablet (100 mg  total) by mouth 2 (two) times daily. (Patient not taking: Reported on 09/27/2017)  . glycopyrrolate (ROBINUL) 2 MG tablet Take 1 tablet (2 mg total) by mouth 2 (two) times daily. (Patient not taking: Reported on 09/27/2017)   No facility-administered encounter medications on file as of 09/27/2017.     Activities of Daily Living In your present state of health, do you have any difficulty performing the following activities: 09/27/2017  Hearing? N  Vision? N  Difficulty concentrating or making decisions? N  Walking or climbing stairs? Y  Dressing or bathing? N  Doing errands, shopping? N  Preparing Food and eating ? N  Using the Toilet? N  In the past six months, have you accidently leaked urine? N  Do you have problems with loss of bowel control? N  Managing your Medications? Y  Comment issues with new med  Managing your Finances? N  Housekeeping or managing your Housekeeping? N  Some recent data might be hidden    Patient Care Team: [name], [name], MD as PCP - General (Family Medicine)    Assessment:   This is a routine wellness examination for Elenora.  Exercise Activities and Dietary recommendations    Goals    . LDL CALC < 130    . Patient Stated     Sits with your mother 10: 30 to 8:30  During the week May try Dr. Dalbert Garnet, [phone]       . Weight (lb) < 317 lb (143.8 kg)     Process of to the [facility] water aerobics       Fall Risk Fall Risk  09/27/2017 08/18/2016 05/13/2014 07/22/2013 06/21/2013  Falls in the past year? Yes Yes  No No No  Number falls in past yr: 2 or more 2 or more - - -  Injury with Fall? - No - - -  Risk Factor Category  - High Fall Risk - - -  Risk for fall due to : Impaired mobility;History of fall(s) History of fall(s);Impaired balance/gait;Impaired mobility - - -  Risk for fall due to: Comment 363lbs with knee issues and balance issues  several falls within the last few months, patient states her knee gives ou without warning - - -  Follow  up (No Data) Falls prevention discussed;Education provided - - -  Comment loses her balance  - - - -   Still has pain in right knee from fall Tries to be careful;   Depression Screen PHQ 2/9 Scores 09/27/2017 08/18/2016 05/13/2014 07/22/2013  PHQ - 2 Score 2 0 0 0  PHQ- 9 Score 5 - - -    The patient c/o of pain in upper left chest and then started crying spontaneously. States her mother is in hospice. She is feeling overwhelmed. Spent 20 minutes assessing and educating and agreed to go to counseling. Given information from [facility] where a counselor is in this office, as well as other community practices. The patient agreed to fup. Stopped crying; agreed to make an apt with Dr. [name] and did so for Friday 3/22 for fup.  Cognitive Function     Ad8 score reviewed for issues:  Issues making decisions:  Less interest in hobbies / activities:  Repeats questions, stories (family complaining):  Trouble using ordinary gadgets (microwave, computer, phone):  Forgets the month or year:   Mismanaging finances:   Remembering appts:  Daily problems with thinking and/or memory: Ad8 score is=0      Immunization History  Administered Date(s) Administered  . Influenza Split 03/29/2012  . Influenza,inj,Quad PF,6+ Mos 04/09/2013, 09/23/2014, 05/04/2015, 04/04/2016, 03/20/2017  . Pneumococcal Polysaccharide-23 03/29/2012, 04/04/2016  . Tdap 10/14/2010   Educated regarding the shingrix   Screening Tests Health Maintenance  Topic Date Due  . PAP SMEAR  03/30/2015  . COLONOSCOPY  09/25/2017  . MAMMOGRAM  09/09/2018  . TETANUS/TDAP  10/13/2020  . INFLUENZA VACCINE  Completed  . Hepatitis C Screening  Completed  . HIV Screening  Completed         Plan:      PCP Notes   Health Maintenance Plans to have a repeat mammogram this year Plans to repeat PAP as well Will have these at [facility]  Will schedule an eye apt  Also plans to have her colonoscopy  this year;   Educated on the shingrix and has tricare, so plans to take this as well   Abnormal Screens  Morbid obesity- BMI 60   knees are worse over the last year and cannot exercise as well; staying with mother in hospice and [facility]  [facility] and [facility]  Phq 5; agreed to counseling for stress and grief   Referrals  Discussed with the patient barriers to weight loss, ongoing stress due to care giving role. Agrees to counseling; given [facility]  Patient concerns; Has sharp pain in left upper chest while here. States this happens occasionally; states it is an 8.  Started crying spontaneously and pain resolved. Tried to get her an apt with Dr. [name] today, but had to reschedule for this Friday am Phq 5. Agreed to consider counseling for stress and again,  given resources.   Having issues filling rx med; vibra tabs and voltaren gel;  Request po voltaren and she will check on her antibiotics at the pharmacy [facility] note sent for fup as needed)   Nurse Concerns; As noted;   Next PCP apt  will fup on Friday 3/22      I have personally reviewed and noted the following in the patient's chart:   . Medical and social history . Use of alcohol, tobacco or illicit drugs  . Current medications and supplements . Functional ability and status . Nutritional status . Physical activity . Advanced directives . List of other physicians . Hospitalizations, surgeries, and ER visits in previous 12 months . Vitals . Screenings to include cognitive, depression, and falls . Referrals and appointments  In addition, I have reviewed and discussed with patient certain preventive protocols, quality metrics, and best practice recommendations. A written personalized care plan for preventive services as well as general preventive health recommendations were provided to patient.     Montine Circle, RN  09/27/2017

## 2017-09-27 ENCOUNTER — Encounter: Payer: Medicare Other | Admitting: Family Medicine

## 2017-09-27 ENCOUNTER — Telehealth: Payer: Self-pay

## 2017-09-27 ENCOUNTER — Ambulatory Visit (INDEPENDENT_AMBULATORY_CARE_PROVIDER_SITE_OTHER): Payer: Medicare Other

## 2017-09-27 VITALS — BP 130/80 | HR 73 | Ht 65.0 in | Wt 363.5 lb

## 2017-09-27 DIAGNOSIS — Z0000 Encounter for general adult medical examination without abnormal findings: Secondary | ICD-10-CM

## 2017-09-27 DIAGNOSIS — Z1331 Encounter for screening for depression: Secondary | ICD-10-CM | POA: Diagnosis not present

## 2017-09-27 NOTE — Progress Notes (Deleted)
ACUTE VISIT   HPI:  No chief complaint on file.   [name] is a 62 y.o. female, who is here today complaining of ***  Low risk stress test on 04/19/2016. Echo in 03/2016: LVEF 65% -70%, grade 1 diastolic dysfunction. She has followed with Dr. Mayford Knife in the past, 2017.  History of GERD, currently she is on Protonix 40 mg daily as needed.  Review of Systems    Current Outpatient Medications on File Prior to Visit  Medication Sig Dispense Refill  . acetaminophen (TYLENOL) 500 MG tablet Take 1 tablet (500 mg total) by mouth every 6 (six) hours as needed. 120 tablet 1  . albuterol (PROVENTIL HFA;VENTOLIN HFA) 108 (90 Base) MCG/ACT inhaler Inhale 1-2 puffs into the lungs every 6 (six) hours as needed for wheezing or shortness of breath. 1 Inhaler 2  . budesonide-formoterol (SYMBICORT) 160-4.5 MCG/ACT inhaler Inhale 2 puffs into the lungs 2 (two) times daily. 3 Inhaler 1  . diclofenac sodium (VOLTAREN) 1 % GEL Apply 4 g topically 4 (four) times daily. 4 Tube 3  . doxycycline (VIBRA-TABS) 100 MG tablet Take 1 tablet (100 mg total) by mouth 2 (two) times daily. (Patient not taking: Reported on 09/27/2017) 60 tablet 0  . fluticasone (FLONASE) 50 MCG/ACT nasal spray Place 1 spray into both nostrils 2 (two) times daily. 16 g 3  . glycopyrrolate (ROBINUL) 2 MG tablet Take 1 tablet (2 mg total) by mouth 2 (two) times daily. (Patient not taking: Reported on 09/27/2017) 60 tablet 2  . levocetirizine (XYZAL) 5 MG tablet Take 1 tablet (5 mg total) by mouth every evening. 90 tablet 1  . lisinopril-hydrochlorothiazide (PRINZIDE,ZESTORETIC) 20-25 MG tablet Take 1 tablet by mouth daily. 90 tablet 1  . methocarbamol (ROBAXIN) 500 MG tablet Take 1 tablet (500 mg total) by mouth every 8 (eight) hours as needed for muscle spasms. 45 tablet 2  . montelukast (SINGULAIR) 10 MG tablet Take 1 tablet (10 mg total) by mouth at bedtime. 90 tablet 1  . pantoprazole (PROTONIX) 40 MG tablet Take 1 tablet  (40 mg total) by mouth daily. (Patient taking differently: Take 40 mg by mouth daily as needed (reflux). ) 30 tablet 3  . traMADol (ULTRAM) 50 MG tablet Take 1-2 tablets (50-100 mg total) by mouth 3 (three) times daily as needed. 60 tablet 0   No current facility-administered medications on file prior to visit.      Past Medical History:  Diagnosis Date  . Abnormal EKG 06/2003   History of inverted T waves V1-V3. Normal 2D echo (07/23/2003): LVEF 65%.  . Anemia    BL Hgb 11-12. Ferritin 14 - low normal (08/2007). Colonoscopy 2009 - external hemorrhoids (excellent prep). Last anemia panel (12/2010) - Iron  24, TIBC 269,  B12  316, Folate 11.9, Ferritin 73.  . Asthma   . Degenerative joint disease    BL knees (L>R), lumbar spine. Followed by Sports Medicine, Dr. Jennette Kettle.  . History of multiple pulmonary nodules    Incidental finding: CT Abd/ Pelvis (04/2010) - Several small lower lobe lung nodules, including one pure ground-glass pulmonary nodule measuring 8 mm in the left lower lobe.  Recommend follow-up chest CT (IV contrast preferred) in 6 months to document stability. //  CT Abd/ Pelvis (07/2010) -  3 mm RLL and  8 mm LLL nodule stable.  Other nodules unchanged, likely benign.  . Hyperlipidemia   . Hypertension   . Incarcerated ventral hernia 04/2010  Noted on CT Abd/ pelvis (04/2010). Patient now s/p ventral hernia repair by Dr. Gerrit FriendsGerkin (12/2010)  . 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 Lower extremity edema    Chronic. 2D echo (2005) - EF 65%.  . Obesity   . Seasonal allergies   . Wears dentures   . Wears glasses    Allergies  Allergen Reactions  . Ketorolac Tromethamine Shortness Of Breath and Palpitations  . Atrovent [Ipratropium] Hives and Rash  . Latex Other (See Comments)    Powdered. Confirm type of reaction with patient.    Social History   Socioeconomic History  . Marital status: Married    Spouse name: Not on file  . Number of  children: 3  . Years of education: Not on file  . Highest education level: Not on file  Social Needs  . Financial resource strain: Not on file  . Food insecurity - worry: Not on file  . Food insecurity - inability: Not on file  . Transportation needs - medical: Not on file  . Transportation needs - non-medical: Not on file  Occupational History  . Not on file  Tobacco Use  . Smoking status: Former Smoker    Packs/day: 0.50    Years: 20.00    Pack years: 10.00    Types: Cigarettes    Last attempt to quit: 09/10/1998    Years since quitting: 19.0  . Smokeless tobacco: Never Used  . Tobacco comment: 62 yo   Substance and Sexual Activity  . Alcohol use: No    Alcohol/week: 0.0 oz  . Drug use: No  . Sexual activity: Yes    Partners: Male  Other Topics Concern  . Not on file  Social History Narrative  . Not on file    There were no vitals filed for this visit. There is no height or weight on file to calculate BMI.      Physical Exam    ASSESSMENT AND PLAN:     There are no diagnoses linked to this encounter.      No Follow-up on file.     -[name] was advised to seek immediate medical attention if sudden worsening symptoms or to follow if they persist or if new concerns arise.       Ardyth Kelso G. SwazilandJordan, MD  [facility]. [place] office.

## 2017-09-27 NOTE — Telephone Encounter (Signed)
The patient was in for AWV 3/20  States she has medicare and Tricare, but could not get her  Voltaren gel filled;  Request pill form  Also did ont start on Vibra-tabs, as she stated these were not covered. She agreed to fup with the pharmacy; apt attempted with Dr. SwazilandJordan this am, but the patient could not stay. Is scheduled to see Dr. SwazilandJordan on Friday 3/22 and will discuss with her then   To note, as not started antibiotic but the patient agreed to check with the pharmacy as tricare generally covers her meds.    Just FYI or advise  Tks

## 2017-09-27 NOTE — Patient Instructions (Addendum)
[name] , Thank you for taking time to come for your Medicare Wellness Visit. I appreciate your ongoing commitment to your health goals. Please review the following plan we discussed and let me know if I can assist you in the future  Will make an apt with one of the [facility] counselor or counselor of choice  Will schedule your colonoscopy   Will get your mammogram in March at [facility] to do PAP as well   Shingrix is a vaccine for the prevention of Shingles in Adults 50 and older.  If you are on Medicare, you can request a prescription from your doctor to be filled at a pharmacy.  Please check with your benefits regarding applicable copays or out of pocket expenses.  The Shingrix is given in 2 vaccines approx 8 weeks apart. You must receive the 2nd dose prior to 6 months from receipt of the first.   These are the goals we discussed: Goals    . LDL CALC < 130    . Patient Stated     Sits with your mother 10: 30 to 8:30  During the week May try Dr. Leafy Ro, [phone]       . Weight (lb) < 317 lb (143.8 kg)     Process of to the [facility] water aerobics       This is a list of the screening recommended for you and due dates:  Health Maintenance  Topic Date Due  . Pap Smear  03/30/2015  . Colon Cancer Screening  09/25/2017  . Mammogram  09/09/2018  . Tetanus Vaccine  10/13/2020  . Flu Shot  Completed  .  Hepatitis C: One time screening is recommended by Center for Disease Control  (CDC) for  adults born from [birth year] through 1964.   Completed  . HIV Screening  Completed     Stress and Stress Management Stress is a normal reaction to life events. It is what you feel when life demands more than you are used to or more than you can handle. Some stress can be useful. For example, the stress reaction can help you catch the last bus of the day, study for a test, or meet a deadline at work. But stress that occurs too often or for too long can cause problems. It can affect  your emotional health and interfere with relationships and normal daily activities. Too much stress can weaken your immune system and increase your risk for physical illness. If you already have a medical problem, stress can make it worse. What are the causes? All sorts of life events may cause stress. An event that causes stress for one person may not be stressful for another person. Major life events commonly cause stress. These may be positive or negative. Examples include losing your job, moving into a new home, getting married, having a baby, or losing a loved one. Less obvious life events may also cause stress, especially if they occur day after day or in combination. Examples include working long hours, driving in traffic, caring for children, being in debt, or being in a difficult relationship. What are the signs or symptoms? Stress may cause emotional symptoms including, the following:  Anxiety. This is feeling worried, afraid, on edge, overwhelmed, or out of control.  Anger. This is feeling irritated or impatient.  Depression. This is feeling sad, down, helpless, or guilty.  Difficulty focusing, remembering, or making decisions.  Stress may cause physical symptoms, including the following:  Aches and pains. These may affect your head, neck, back, stomach, or other areas of your body.  Tight muscles or clenched jaw.  Low energy or trouble sleeping.  Stress may cause unhealthy behaviors, including the following:  Eating to feel better (overeating) or skipping meals.  Sleeping too little, too much, or both.  Working too much or putting off tasks (procrastination).  Smoking, drinking alcohol, or using drugs to feel better.  How is this diagnosed? Stress is diagnosed through an assessment by your health care provider. Your health care provider will ask questions about your symptoms and any stressful life events.Your health care provider will also ask about your medical history  and may order blood tests or other tests. Certain medical conditions and medicine can cause physical symptoms similar to stress. Mental illness can cause emotional symptoms and unhealthy behaviors similar to stress. Your health care provider may refer you to a mental health professional for further evaluation. How is this treated? Stress management is the recommended treatment for stress.The goals of stress management are reducing stressful life events and coping with stress in healthy ways. Techniques for reducing stressful life events include the following:  Stress identification. Self-monitor for stress and identify what causes stress for you. These skills may help you to avoid some stressful events.  Time management. Set your priorities, keep a calendar of events, and learn to say "no." These tools can help you avoid making too many commitments.  Techniques for coping with stress include the following:  Rethinking the problem. Try to think realistically about stressful events rather than ignoring them or overreacting. Try to find the positives in a stressful situation rather than focusing on the negatives.  Exercise. Physical exercise can release both physical and emotional tension. The key is to find a form of exercise you enjoy and do it regularly.  Relaxation techniques. These relax the body and mind. Examples include yoga, meditation, tai chi, biofeedback, deep breathing, progressive muscle relaxation, listening to music, being out in nature, journaling, and other hobbies. Again, the key is to find one or more that you enjoy and can do regularly.  Healthy lifestyle. Eat a balanced diet, get plenty of sleep, and do not smoke. Avoid using alcohol or drugs to relax.  Strong support network. Spend time with family, friends, or other people you enjoy being around.Express your feelings and talk things over with someone you trust.  Counseling or talktherapy with a mental health professional  may be helpful if you are having difficulty managing stress on your own. Medicine is typically not recommended for the treatment of stress.Talk to your health care provider if you think you need medicine for symptoms of stress. Follow these instructions at home:  Keep all follow-up visits as directed by your health care provider.  Take all medicines as directed by your health care provider. Contact a health care provider if:  Your symptoms get worse or you start having new symptoms.  You feel overwhelmed by your problems and can no longer manage them on your own. Get help right away if:  You feel like hurting yourself or someone else. This information is not intended to replace advice given to you by your health care provider. Make sure you discuss any questions you have with your health care provider. Document Released: 12/21/2000 Document Revised: 12/03/2015 Document Reviewed: 02/19/2013 Elsevier Interactive Patient Education  2017 [place].   Colonoscopy, Adult A colonoscopy is an exam to look at the entire large intestine. During the exam,  a lubricated, bendable tube is inserted into the anus and then passed into the rectum, colon, and other parts of the large intestine. A colonoscopy is often done as a part of normal colorectal screening or in response to certain symptoms, such as anemia, persistent diarrhea, abdominal pain, and blood in the stool. The exam can help screen for and diagnose medical problems, including:  Tumors.  Polyps.  Inflammation.  Areas of bleeding.  Tell a health care provider about:  Any allergies you have.  All medicines you are taking, including vitamins, herbs, eye drops, creams, and over-the-counter medicines.  Any problems you or family members have had with anesthetic medicines.  Any blood disorders you have.  Any surgeries you have had.  Any medical conditions you have.  Any problems you have had passing stool. What are the  risks? Generally, this is a safe procedure. However, problems may occur, including:  Bleeding.  A tear in the intestine.  A reaction to medicines given during the exam.  Infection (rare).  What happens before the procedure? Eating and drinking restrictions Follow instructions from your health care provider about eating and drinking, which may include:  A few days before the procedure - follow a low-fiber diet. Avoid nuts, seeds, dried fruit, raw fruits, and vegetables.  1-3 days before the procedure - follow a clear liquid diet. Drink only clear liquids, such as clear broth or bouillon, black coffee or tea, clear juice, clear soft drinks or sports drinks, gelatin dessert, and popsicles. Avoid any liquids that contain red or purple dye.  On the day of the procedure - do not eat or drink anything during the 2 hours before the procedure, or within the time period that your health care provider recommends.  Bowel prep If you were prescribed an oral bowel prep to clean out your colon:  Take it as told by your health care provider. Starting the day before your procedure, you will need to drink a large amount of medicated liquid. The liquid will cause you to have multiple loose stools until your stool is almost clear or light green.  If your skin or anus gets irritated from diarrhea, you may use these to relieve the irritation: ? Medicated wipes, such as adult wet wipes with aloe and vitamin E. ? A skin soothing-product like petroleum jelly.  If you vomit while drinking the bowel prep, take a break for up to 60 minutes and then begin the bowel prep again. If vomiting continues and you cannot take the bowel prep without vomiting, call your health care provider.  General instructions  Ask your health care provider about changing or stopping your regular medicines. This is especially important if you are taking diabetes medicines or blood thinners.  Plan to have someone take you home from  the hospital or clinic. What happens during the procedure?  An IV tube may be inserted into one of your veins.  You will be given medicine to help you relax (sedative).  To reduce your risk of infection: ? Your health care team will wash or sanitize their hands. ? Your anal area will be washed with soap.  You will be asked to lie on your side with your knees bent.  Your health care provider will lubricate a long, thin, flexible tube. The tube will have a camera and a light on the end.  The tube will be inserted into your anus.  The tube will be gently eased through your rectum and colon.  Air will  be delivered into your colon to keep it open. You may feel some pressure or cramping.  The camera will be used to take images during the procedure.  A small tissue sample may be removed from your body to be examined under a microscope (biopsy). If any potential problems are found, the tissue will be sent to a lab for testing.  If small polyps are found, your health care provider may remove them and have them checked for cancer cells.  The tube that was inserted into your anus will be slowly removed. The procedure may vary among health care providers and hospitals. What happens after the procedure?  Your blood pressure, heart rate, breathing rate, and blood oxygen level will be monitored until the medicines you were given have worn off.  Do not drive for 24 hours after the exam.  You may have a small amount of blood in your stool.  You may pass gas and have mild abdominal cramping or bloating due to the air that was used to inflate your colon during the exam.  It is up to you to get the results of your procedure. Ask your health care provider, or the department performing the procedure, when your results will be ready. This information is not intended to replace advice given to you by your health care provider. Make sure you discuss any questions you have with your health care  provider. Document Released: 06/24/2000 Document Revised: 04/27/2016 Document Reviewed: 09/08/2015 Elsevier Interactive Patient Education  2018 [place] Prevention in the [place] can cause injuries. They can happen to people of all ages. There are many things you can do to make your home safe and to help prevent falls. What can I do on the outside of my home?  Regularly fix the edges of walkways and driveways and fix any cracks.  Remove anything that might make you trip as you walk through a door, such as a raised step or threshold.  Trim any bushes or trees on the path to your home.  Use bright outdoor lighting.  Clear any walking paths of anything that might make someone trip, such as rocks or tools.  Regularly check to see if handrails are loose or broken. Make sure that both sides of any steps have handrails.  Any raised decks and porches should have guardrails on the edges.  Have any leaves, snow, or ice cleared regularly.  Use sand or salt on walking paths during winter.  Clean up any spills in your garage right away. This includes oil or grease spills. What can I do in the bathroom?  Use night lights.  Install grab bars by the toilet and in the tub and shower. Do not use towel bars as grab bars.  Use non-skid mats or decals in the tub or shower.  If you need to sit down in the shower, use a plastic, non-slip stool.  Keep the floor dry. Clean up any water that spills on the floor as soon as it happens.  Remove soap buildup in the tub or shower regularly.  Attach bath mats securely with double-sided non-slip rug tape.  Do not have throw rugs and other things on the floor that can make you trip. What can I do in the bedroom?  Use night lights.  Make sure that you have a light by your bed that is easy to reach.  Do not use any sheets or blankets that are too big for your bed. They should not  hang down onto the floor.  Have a firm chair that has  side arms. You can use this for support while you get dressed.  Do not have throw rugs and other things on the floor that can make you trip. What can I do in the kitchen?  Clean up any spills right away.  Avoid walking on wet floors.  Keep items that you use a lot in easy-to-reach places.  If you need to reach something above you, use a strong step stool that has a grab bar.  Keep electrical cords out of the way.  Do not use floor polish or wax that makes floors slippery. If you must use wax, use non-skid floor wax.  Do not have throw rugs and other things on the floor that can make you trip. What can I do with my stairs?  Do not leave any items on the stairs.  Make sure that there are handrails on both sides of the stairs and use them. Fix handrails that are broken or loose. Make sure that handrails are as long as the stairways.  Check any carpeting to make sure that it is firmly attached to the stairs. Fix any carpet that is loose or worn.  Avoid having throw rugs at the top or bottom of the stairs. If you do have throw rugs, attach them to the floor with carpet tape.  Make sure that you have a light switch at the top of the stairs and the bottom of the stairs. If you do not have them, ask someone to add them for you. What else can I do to help prevent falls?  Wear shoes that: ? Do not have high heels. ? Have rubber bottoms. ? Are comfortable and fit you well. ? Are closed at the toe. Do not wear sandals.  If you use a stepladder: ? Make sure that it is fully opened. Do not climb a closed stepladder. ? Make sure that both sides of the stepladder are locked into place. ? Ask someone to hold it for you, if possible.  Clearly mark and make sure that you can see: ? Any grab bars or handrails. ? First and last steps. ? Where the edge of each step is.  Use tools that help you move around (mobility aids) if they are needed. These  include: ? Canes. ? Walkers. ? Scooters. ? Crutches.  Turn on the lights when you go into a dark area. Replace any light bulbs as soon as they burn out.  Set up your furniture so you have a clear path. Avoid moving your furniture around.  If any of your floors are uneven, fix them.  If there are any pets around you, be aware of where they are.  Review your medicines with your doctor. Some medicines can make you feel dizzy. This can increase your chance of falling. Ask your doctor what other things that you can do to help prevent falls. This information is not intended to replace advice given to you by your health care provider. Make sure you discuss any questions you have with your health care provider. Document Released: 04/23/2009 Document Revised: 12/03/2015 Document Reviewed: 08/01/2014 Elsevier Interactive Patient Education  2018 [place] Maintenance, Female Adopting a healthy lifestyle and getting preventive care can go a long way to promote health and wellness. Talk with your health care provider about what schedule of regular examinations is right for you. This is a good chance for you to check in with  your provider about disease prevention and staying healthy. In between checkups, there are plenty of things you can do on your own. Experts have done a lot of research about which lifestyle changes and preventive measures are most likely to keep you healthy. Ask your health care provider for more information. Weight and diet Eat a healthy diet  Be sure to include plenty of vegetables, fruits, low-fat dairy products, and lean protein.  Do not eat a lot of foods high in solid fats, added sugars, or salt.  Get regular exercise. This is one of the most important things you can do for your health. ? Most adults should exercise for at least 150 minutes each week. The exercise should increase your heart rate and make you sweat (moderate-intensity exercise). ? Most adults  should also do strengthening exercises at least twice a week. This is in addition to the moderate-intensity exercise.  Maintain a healthy weight  Body mass index (BMI) is a measurement that can be used to identify possible weight problems. It estimates body fat based on height and weight. Your health care provider can help determine your BMI and help you achieve or maintain a healthy weight.  For females 18 years of age and older: ? A BMI below 18.5 is considered underweight. ? A BMI of 18.5 to 24.9 is normal. ? A BMI of 25 to 29.9 is considered overweight. ? A BMI of 30 and above is considered obese.  Watch levels of cholesterol and blood lipids  You should start having your blood tested for lipids and cholesterol at 61 years of age, then have this test every 5 years.  You may need to have your cholesterol levels checked more often if: ? Your lipid or cholesterol levels are high. ? You are older than 61 years of age. ? You are at high risk for heart disease.  Cancer screening Lung Cancer  Lung cancer screening is recommended for adults 4-21 years old who are at high risk for lung cancer because of a history of smoking.  A yearly low-dose CT scan of the lungs is recommended for people who: ? Currently smoke. ? Have quit within the past 15 years. ? Have at least a 30-pack-year history of smoking. A pack year is smoking an average of one pack of cigarettes a day for 1 year.  Yearly screening should continue until it has been 15 years since you quit.  Yearly screening should stop if you develop a health problem that would prevent you from having lung cancer treatment.  Breast Cancer  Practice breast self-awareness. This means understanding how your breasts normally appear and feel.  It also means doing regular breast self-exams. Let your health care provider know about any changes, no matter how small.  If you are in your 20s or 30s, you should have a clinical breast exam (CBE)  by a health care provider every 1-3 years as part of a regular health exam.  If you are 64 or older, have a CBE every year. Also consider having a breast X-ray (mammogram) every year.  If you have a family history of breast cancer, talk to your health care provider about genetic screening.  If you are at high risk for breast cancer, talk to your health care provider about having an MRI and a mammogram every year.  Breast cancer gene (BRCA) assessment is recommended for women who have family members with BRCA-related cancers. BRCA-related cancers include: ? Breast. ? Ovarian. ? Tubal. ? Peritoneal cancers.  Results of the assessment will determine the need for genetic counseling and BRCA1 and BRCA2 testing.  Cervical Cancer Your health care provider may recommend that you be screened regularly for cancer of the pelvic organs (ovaries, uterus, and vagina). This screening involves a pelvic examination, including checking for microscopic changes to the surface of your cervix (Pap test). You may be encouraged to have this screening done every 3 years, beginning at age 48.  For women ages 57-65, health care providers may recommend pelvic exams and Pap testing every 3 years, or they may recommend the Pap and pelvic exam, combined with testing for human papilloma virus (HPV), every 5 years. Some types of HPV increase your risk of cervical cancer. Testing for HPV may also be done on women of any age with unclear Pap test results.  Other health care providers may not recommend any screening for nonpregnant women who are considered low risk for pelvic cancer and who do not have symptoms. Ask your health care provider if a screening pelvic exam is right for you.  If you have had past treatment for cervical cancer or a condition that could lead to cancer, you need Pap tests and screening for cancer for at least 20 years after your treatment. If Pap tests have been discontinued, your risk factors (such as  having a new sexual partner) need to be reassessed to determine if screening should resume. Some women have medical problems that increase the chance of getting cervical cancer. In these cases, your health care provider may recommend more frequent screening and Pap tests.  Colorectal Cancer  This type of cancer can be detected and often prevented.  Routine colorectal cancer screening usually begins at 61 years of age and continues through 61 years of age.  Your health care provider may recommend screening at an earlier age if you have risk factors for colon cancer.  Your health care provider may also recommend using home test kits to check for hidden blood in the stool.  A small camera at the end of a tube can be used to examine your colon directly (sigmoidoscopy or colonoscopy). This is done to check for the earliest forms of colorectal cancer.  Routine screening usually begins at age 31.  Direct examination of the colon should be repeated every 5-10 years through 61 years of age. However, you may need to be screened more often if early forms of precancerous polyps or small growths are found.  Skin Cancer  Check your skin from head to toe regularly.  Tell your health care provider about any new moles or changes in moles, especially if there is a change in a mole's shape or color.  Also tell your health care provider if you have a mole that is larger than the size of a pencil eraser.  Always use sunscreen. Apply sunscreen liberally and repeatedly throughout the day.  Protect yourself by wearing long sleeves, pants, a wide-brimmed hat, and sunglasses whenever you are outside.  Heart disease, diabetes, and high blood pressure  High blood pressure causes heart disease and increases the risk of stroke. High blood pressure is more likely to develop in: ? People who have blood pressure in the high end of the normal range (130-139/85-89 mm Hg). ? People who are overweight or  obese. ? People who are African American.  If you are 60-104 years of age, have your blood pressure checked every 3-5 years. If you are 72 years of age or older, have your blood pressure  checked every year. You should have your blood pressure measured twice-once when you are at a hospital or clinic, and once when you are not at a hospital or clinic. Record the average of the two measurements. To check your blood pressure when you are not at a hospital or clinic, you can use: ? An automated blood pressure machine at a pharmacy. ? A home blood pressure monitor.  If you are between 53 years and 93 years old, ask your health care provider if you should take aspirin to prevent strokes.  Have regular diabetes screenings. This involves taking a blood sample to check your fasting blood sugar level. ? If you are at a normal weight and have a low risk for diabetes, have this test once every three years after 61 years of age. ? If you are overweight and have a high risk for diabetes, consider being tested at a younger age or more often. Preventing infection Hepatitis B  If you have a higher risk for hepatitis B, you should be screened for this virus. You are considered at high risk for hepatitis B if: ? You were born in a country where hepatitis B is common. Ask your health care provider which countries are considered high risk. ? Your parents were born in a high-risk country, and you have not been immunized against hepatitis B (hepatitis B vaccine). ? You have HIV or AIDS. ? You use needles to inject street drugs. ? You live with someone who has hepatitis B. ? You have had sex with someone who has hepatitis B. ? You get hemodialysis treatment. ? You take certain medicines for conditions, including cancer, organ transplantation, and autoimmune conditions.  Hepatitis C  Blood testing is recommended for: ? Everyone born from [birth year] through 1964. ? Anyone with known risk factors for hepatitis  C.  Sexually transmitted infections (STIs)  You should be screened for sexually transmitted infections (STIs) including gonorrhea and chlamydia if: ? You are sexually active and are younger than 61 years of age. ? You are older than 61 years of age and your health care provider tells you that you are at risk for this type of infection. ? Your sexual activity has changed since you were last screened and you are at an increased risk for chlamydia or gonorrhea. Ask your health care provider if you are at risk.  If you do not have HIV, but are at risk, it may be recommended that you take a prescription medicine daily to prevent HIV infection. This is called pre-exposure prophylaxis (PrEP). You are considered at risk if: ? You are sexually active and do not regularly use condoms or know the HIV status of your partner(s). ? You take drugs by injection. ? You are sexually active with a partner who has HIV.  Talk with your health care provider about whether you are at high risk of being infected with HIV. If you choose to begin PrEP, you should first be tested for HIV. You should then be tested every 3 months for as long as you are taking PrEP. Pregnancy  If you are premenopausal and you may become pregnant, ask your health care provider about preconception counseling.  If you may become pregnant, take 400 to 800 micrograms (mcg) of folic acid every day.  If you want to prevent pregnancy, talk to your health care provider about birth control (contraception). Osteoporosis and menopause  Osteoporosis is a disease in which the bones lose minerals and strength with aging. This can  result in serious bone fractures. Your risk for osteoporosis can be identified using a bone density scan.  If you are 42 years of age or older, or if you are at risk for osteoporosis and fractures, ask your health care provider if you should be screened.  Ask your health care provider whether you should take a calcium or  vitamin D supplement to lower your risk for osteoporosis.  Menopause may have certain physical symptoms and risks.  Hormone replacement therapy may reduce some of these symptoms and risks. Talk to your health care provider about whether hormone replacement therapy is right for you. Follow these instructions at home:  Schedule regular health, dental, and eye exams.  Stay current with your immunizations.  Do not use any tobacco products including cigarettes, chewing tobacco, or electronic cigarettes.  If you are pregnant, do not drink alcohol.  If you are breastfeeding, limit how much and how often you drink alcohol.  Limit alcohol intake to no more than 1 drink per day for nonpregnant women. One drink equals 12 ounces of beer, 5 ounces of wine, or 1 ounces of hard liquor.  Do not use street drugs.  Do not share needles.  Ask your health care provider for help if you need support or information about quitting drugs.  Tell your health care provider if you often feel depressed.  Tell your health care provider if you have ever been abused or do not feel safe at home. This information is not intended to replace advice given to you by your health care provider. Make sure you discuss any questions you have with your health care provider. Document Released: 01/10/2011 Document Revised: 12/03/2015 Document Reviewed: 03/31/2015 Elsevier Interactive Patient Education  [employer].

## 2017-09-29 ENCOUNTER — Ambulatory Visit (INDEPENDENT_AMBULATORY_CARE_PROVIDER_SITE_OTHER): Payer: Medicare Other | Admitting: Family Medicine

## 2017-09-29 ENCOUNTER — Encounter: Payer: Self-pay | Admitting: Family Medicine

## 2017-09-29 VITALS — BP 126/70 | HR 83 | Temp 98.4°F | Resp 12 | Ht 65.0 in | Wt 363.1 lb

## 2017-09-29 DIAGNOSIS — R600 Localized edema: Secondary | ICD-10-CM

## 2017-09-29 DIAGNOSIS — M545 Low back pain: Secondary | ICD-10-CM | POA: Diagnosis not present

## 2017-09-29 DIAGNOSIS — G8929 Other chronic pain: Secondary | ICD-10-CM | POA: Diagnosis not present

## 2017-09-29 DIAGNOSIS — F325 Major depressive disorder, single episode, in full remission: Secondary | ICD-10-CM | POA: Insufficient documentation

## 2017-09-29 DIAGNOSIS — F321 Major depressive disorder, single episode, moderate: Secondary | ICD-10-CM | POA: Diagnosis not present

## 2017-09-29 MED ORDER — TIZANIDINE HCL 2 MG PO TABS: 2.0000 mg | ORAL_TABLET | Freq: Three times a day (TID) | ORAL | 1 refills | Status: DC | PRN

## 2017-09-29 MED ORDER — DICLOFENAC SODIUM 75 MG PO TBEC: 75.0000 mg | DELAYED_RELEASE_TABLET | Freq: Two times a day (BID) | ORAL | 1 refills | Status: DC | PRN

## 2017-09-29 MED ORDER — DULOXETINE HCL 30 MG PO CPEP: 30.0000 mg | ORAL_CAPSULE | Freq: Every day | ORAL | 1 refills | Status: DC

## 2017-09-29 MED ORDER — FUROSEMIDE 20 MG PO TABS: 20.0000 mg | ORAL_TABLET | Freq: Every day | ORAL | 2 refills | Status: DC | PRN

## 2017-09-29 NOTE — Patient Instructions (Addendum)
A few things to remember from today's visit:   Chronic low back pain, unspecified back pain laterality, with sciatica presence unspecified - Plan: diclofenac (VOLTAREN) 75 MG EC tablet, DULoxetine (CYMBALTA) 30 MG capsule, tiZANidine (ZANAFLEX) 2 MG tablet  Depression, major, single episode, moderate (HCC) - Plan: DULoxetine (CYMBALTA) 30 MG capsule  Bilateral lower extremity edema - Plan: furosemide (LASIX) 20 MG tablet  Keep next appt. Start medication one at the time with 3-5 days in between.  Do not take Tramadol and Cymbalta together.   Please be sure medication list is accurate. If a new problem present, please set up appointment sooner than planned today.

## 2017-09-29 NOTE — Progress Notes (Signed)
ACUTE VISIT   HPI:  Chief Complaint  Patient presents with  . Follow-up    anxiety, depression    [name] is a 62 y.o. female, who is here today complaining of feeling anxious and depressed for the past 8 weeks.  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 She denies suicidal thoughts. She denies prior history of psychiatric problems.    Depression screen [facility] 2/9 09/29/2017  Decreased Interest 3  Down, Depressed, Hopeless 1  PHQ - 2 Score 4  Altered sleeping 2  Tired, decreased energy 2  Change in appetite 3  Feeling bad or failure about yourself  1  Trouble concentrating 2  Moving slowly or fidgety/restless 1  Suicidal thoughts 0  PHQ-9 Score 15  Difficult doing work/chores Very difficult   She was seen recently for back pain, history of OA, mainly knees.  She could not for Voltaren gel, she is requesting prescription for oral Diclofenac. Pain is exacerbated by prolonged walking, standing, getting up after prolonged sitting. She follows with orthopedist, Dr. Roda Shutters.  She takes Tramadol 50 mg as needed, states that she does not take it often.  Last office visit I recommended methocarbamol for back pain, medication was not covered by her insurance.   She is also complaining of lower extremity edema, chronic. It seems to be worse at the end of the day and alleviated by elevation of lower extremities. She denies erythema or calf pain.  She denies orthopnea or PND. She has no noted gross hematuria, foam in urine, or decreased urine output.   Review of Systems  Constitutional: Positive for fatigue. Negative for activity change, appetite change and unexpected weight change.  Respiratory: Negative for shortness of breath and wheezing.   Cardiovascular: Negative for chest pain, palpitations and leg swelling.  Gastrointestinal: Negative for abdominal pain, diarrhea, nausea and vomiting.  Endocrine: Negative  for cold intolerance and heat intolerance.  Genitourinary: Negative for decreased urine volume and hematuria.  Musculoskeletal: Positive for arthralgias and back pain. Negative for gait problem and myalgias.  Skin: Negative for rash.  Allergic/Immunologic: Positive for environmental allergies.  Neurological: Negative for tremors, seizures and headaches.  Psychiatric/Behavioral: Positive for sleep disturbance. Negative for confusion, hallucinations and suicidal ideas. The patient is nervous/anxious.       Current Outpatient Medications on File Prior to Visit  Medication Sig Dispense Refill  . acetaminophen (TYLENOL) 500 MG tablet Take 1 tablet (500 mg total) by mouth every 6 (six) hours as needed. 120 tablet 1  . albuterol (PROVENTIL HFA;VENTOLIN HFA) 108 (90 Base) MCG/ACT inhaler Inhale 1-2 puffs into the lungs every 6 (six) hours as needed for wheezing or shortness of breath. 1 Inhaler 2  . budesonide-formoterol (SYMBICORT) 160-4.5 MCG/ACT inhaler Inhale 2 puffs into the lungs 2 (two) times daily. 3 Inhaler 1  . diclofenac sodium (VOLTAREN) 1 % GEL Apply 4 g topically 4 (four) times daily. 4 Tube 3  . fluticasone (FLONASE) 50 MCG/ACT nasal spray Place 1 spray into both nostrils 2 (two) times daily. 16 g 3  . levocetirizine (XYZAL) 5 MG tablet Take 1 tablet (5 mg total) by mouth every evening. 90 tablet 1  . lisinopril-hydrochlorothiazide (PRINZIDE,ZESTORETIC) 20-25 MG tablet Take 1 tablet by mouth daily. 90 tablet 1  . montelukast (SINGULAIR) 10 MG tablet Take 1 tablet (10 mg total) by mouth at bedtime. 90 tablet 1  . pantoprazole (PROTONIX) 40 MG tablet Take 1  tablet (40 mg total) by mouth daily. (Patient taking differently: Take 40 mg by mouth daily as needed (reflux). ) 30 tablet 3  . traMADol (ULTRAM) 50 MG tablet Take 1-2 tablets (50-100 mg total) by mouth 3 (three) times daily as needed. 60 tablet 0  . doxycycline (VIBRA-TABS) 100 MG tablet Take 1 tablet (100 mg total) by mouth 2 (two)  times daily. (Patient not taking: Reported on 09/27/2017) 60 tablet 0  . glycopyrrolate (ROBINUL) 2 MG tablet Take 1 tablet (2 mg total) by mouth 2 (two) times daily. (Patient not taking: Reported on 09/27/2017) 60 tablet 2   No current facility-administered medications on file prior to visit.      Past Medical History:  Diagnosis Date  . Abnormal EKG 06/2003   History of inverted T waves V1-V3. Normal 2D echo (07/23/2003): LVEF 65%.  . Anemia    BL Hgb 11-12. Ferritin 14 - low normal (08/2007). Colonoscopy 2009 - external hemorrhoids (excellent prep). Last anemia panel (12/2010) - Iron  24, TIBC 269,  B12  316, Folate 11.9, Ferritin 73.  . Asthma   . Degenerative joint disease    BL knees (L>R), lumbar spine. Followed by Sports Medicine, Dr. Jennette KettleNeal.  . History of multiple pulmonary nodules    Incidental finding: CT Abd/ Pelvis (04/2010) - Several small lower lobe lung nodules, including one pure ground-glass pulmonary nodule measuring 8 mm in the left lower lobe.  Recommend follow-up chest CT (IV contrast preferred) in 6 months to document stability. //  CT Abd/ Pelvis (07/2010) -  3 mm RLL and  8 mm LLL nodule stable.  Other nodules unchanged, likely benign.  . Hyperlipidemia   . Hypertension   . Incarcerated ventral hernia 04/2010   Noted on CT Abd/ pelvis (04/2010). Patient now s/p ventral hernia repair by Dr. Gerrit FriendsGerkin (12/2010)  . 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 Lower extremity edema    Chronic. 2D echo (2005) - EF 65%.  . Obesity   . Seasonal allergies   . Wears dentures   . Wears glasses    Allergies  Allergen Reactions  . Ketorolac Tromethamine Shortness Of Breath and Palpitations  . Atrovent [Ipratropium] Hives and Rash  . Latex Other (See Comments)    Powdered. Confirm type of reaction with patient.    Social History   Socioeconomic History  . Marital status: Married    Spouse name: Not on file  . Number of children: 3  . Years of  education: Not on file  . Highest education level: Not on file  Occupational History  . Not on file  Social Needs  . Financial resource strain: Not on file  . Food insecurity:    Worry: Not on file    Inability: Not on file  . Transportation needs:    Medical: Not on file    Non-medical: Not on file  Tobacco Use  . Smoking status: Former Smoker    Packs/day: 0.50    Years: 20.00    Pack years: 10.00    Types: Cigarettes    Last attempt to quit: 09/10/1998    Years since quitting: 19.0  . Smokeless tobacco: Never Used  . Tobacco comment: 62 yo   Substance and Sexual Activity  . Alcohol use: No    Alcohol/week: 0.0 oz  . Drug use: No  . Sexual activity: Yes    Partners: Male  Lifestyle  . Physical activity:    Days per  week: Not on file    Minutes per session: Not on file  . Stress: Not on file  Relationships  . Social connections:    Talks on phone: Not on file    Gets together: Not on file    Attends religious service: Not on file    Active member of club or organization: Not on file    Attends meetings of clubs or organizations: Not on file    Relationship status: Not on file  Other Topics Concern  . Not on file  Social History Narrative  . Not on file    Vitals:   09/29/17 1036  BP: 126/70  Pulse: 83  Resp: 12  Temp: 98.4 F (36.9 C)  SpO2: 95%   Body mass index is 60.43 kg/m.    Physical Exam  Nursing note and vitals reviewed. Constitutional: She is oriented to person, place, and time. She appears well-developed. No distress.  HENT:  Head: Normocephalic and atraumatic.  Mouth/Throat: Oropharynx is clear and moist and mucous membranes are normal.  Eyes: Pupils are equal, round, and reactive to light. Conjunctivae are normal.  Cardiovascular: Normal rate and regular rhythm.  No murmur heard. Pulses:      Dorsalis pedis pulses are 2+ on the right side, and 2+ on the left side.  Respiratory: Effort normal and breath sounds normal. No respiratory  distress.  GI: Soft. She exhibits no mass. There is no tenderness.  Musculoskeletal: She exhibits edema (2+ pitting LE bilateral.) and tenderness (pretibial area,bilateral).       Thoracic back: She exhibits tenderness. She exhibits no bony tenderness.       Right lower leg: She exhibits no tenderness.       Left lower leg: She exhibits no tenderness.  Lymphadenopathy:    She has no cervical adenopathy.  Neurological: She is alert and oriented to person, place, and time. She has normal strength. Coordination normal.  Skin: Skin is warm. No erythema.  Psychiatric: Her mood appears anxious. Her affect is labile.  Well groomed, good eye contact.      ASSESSMENT AND PLAN:   [name] was seen today for follow-up.  Diagnoses and all orders for this visit:  Chronic low back pain, unspecified back pain laterality, with sciatica presence unspecified  Methocarbamol changed for Zanaflex 2 mg 3 times per day as needed, side effects discussed. Weight loss will help. Cymbalta 30 mg started today, will increase dose to 60 mg next visit if well tolerated. Continue following with orthopedist.  -     Diclofenac (VOLTAREN) 75 MG EC tablet; Take 1 tablet (75 mg total) by mouth 2 (two) times daily as needed. -     DULoxetine (CYMBALTA) 30 MG capsule; Take 1 capsule (30 mg total) by mouth daily. -     tiZANidine (ZANAFLEX) 2 MG tablet; Take 1 tablet (2 mg total) by mouth every 8 (eight) hours as needed for muscle spasms.  Depression, major, single episode, moderate (HCC)  After discussion of pharmacologic treatment options as well as side effects, I think Seen but there is a good option for her given her history of chronic pain. We discussed some side effects, including risk of interaction with Tramadol. She will Cymbalta 30 mg daily. Follow-up in 4 weeks, before if needed. Instructed about warning signs. Psychotherapy also recommended, information given, so she can arrange appointment.  -      DULoxetine (CYMBALTA) 30 MG capsule; Take 1 capsule (30 mg total) by mouth daily.  Bilateral  lower extremity edema  Possible etiologies discussed. Most likely related with vein disease and aggravated by obesity. Compression stockings will help. Elevation of lower extremity above heart level a few times per day. Furosemide 20 mg daily as needed recommended.  Some side effects of diuretic discussed.  -     furosemide (LASIX) 20 MG tablet; Take 1 tablet (20 mg total) by mouth daily as needed.        Return in about 1 month (around 10/27/2017).     -[name] was advised to seek immediate medical attention if sudden worsening symptoms.      [name]. [name], MD  [facility]. [place] office.

## 2017-10-01 NOTE — Progress Notes (Signed)
I have reviewed documentation from this visit and I agree with recommendations given.  [name]. [place], MD  [facility]. [place] office.   

## 2017-10-03 ENCOUNTER — Ambulatory Visit: Payer: Medicare Other

## 2017-10-11 DIAGNOSIS — G4733 Obstructive sleep apnea (adult) (pediatric): Secondary | ICD-10-CM | POA: Diagnosis not present

## 2017-10-18 DIAGNOSIS — G4733 Obstructive sleep apnea (adult) (pediatric): Secondary | ICD-10-CM | POA: Diagnosis not present

## 2017-10-18 DIAGNOSIS — I10 Essential (primary) hypertension: Secondary | ICD-10-CM | POA: Diagnosis not present

## 2017-10-18 DIAGNOSIS — M79609 Pain in unspecified limb: Secondary | ICD-10-CM | POA: Diagnosis not present

## 2017-10-18 DIAGNOSIS — M519 Unspecified thoracic, thoracolumbar and lumbosacral intervertebral disc disorder: Secondary | ICD-10-CM | POA: Diagnosis not present

## 2017-10-24 DIAGNOSIS — R079 Chest pain, unspecified: Secondary | ICD-10-CM | POA: Diagnosis not present

## 2017-10-25 ENCOUNTER — Observation Stay (HOSPITAL_COMMUNITY)
Admission: EM | Admit: 2017-10-25 | Discharge: 2017-10-27 | Disposition: A | Discharge status: Home Health | Payer: Medicare Other | Attending: Internal Medicine | Admitting: Internal Medicine

## 2017-10-25 ENCOUNTER — Emergency Department (HOSPITAL_COMMUNITY): Payer: Medicare Other

## 2017-10-25 ENCOUNTER — Encounter (HOSPITAL_COMMUNITY): Payer: Self-pay | Admitting: *Deleted

## 2017-10-25 ENCOUNTER — Ambulatory Visit (HOSPITAL_BASED_OUTPATIENT_CLINIC_OR_DEPARTMENT_OTHER): Payer: Medicare Other

## 2017-10-25 ENCOUNTER — Other Ambulatory Visit: Payer: Self-pay

## 2017-10-25 DIAGNOSIS — Z91040 Latex allergy status: Secondary | ICD-10-CM | POA: Insufficient documentation

## 2017-10-25 DIAGNOSIS — Z8249 Family history of ischemic heart disease and other diseases of the circulatory system: Secondary | ICD-10-CM | POA: Insufficient documentation

## 2017-10-25 DIAGNOSIS — Z6841 Body Mass Index (BMI) 40.0 and over, adult: Secondary | ICD-10-CM | POA: Insufficient documentation

## 2017-10-25 DIAGNOSIS — S8991XD Unspecified injury of right lower leg, subsequent encounter: Secondary | ICD-10-CM

## 2017-10-25 DIAGNOSIS — R0789 Other chest pain: Secondary | ICD-10-CM | POA: Diagnosis not present

## 2017-10-25 DIAGNOSIS — M1990 Unspecified osteoarthritis, unspecified site: Secondary | ICD-10-CM | POA: Diagnosis not present

## 2017-10-25 DIAGNOSIS — R079 Chest pain, unspecified: Secondary | ICD-10-CM | POA: Diagnosis not present

## 2017-10-25 DIAGNOSIS — R7989 Other specified abnormal findings of blood chemistry: Secondary | ICD-10-CM | POA: Diagnosis present

## 2017-10-25 DIAGNOSIS — I10 Essential (primary) hypertension: Secondary | ICD-10-CM | POA: Diagnosis present

## 2017-10-25 DIAGNOSIS — E785 Hyperlipidemia, unspecified: Secondary | ICD-10-CM | POA: Diagnosis present

## 2017-10-25 DIAGNOSIS — Z9119 Patient's noncompliance with other medical treatment and regimen: Secondary | ICD-10-CM | POA: Diagnosis not present

## 2017-10-25 DIAGNOSIS — I519 Heart disease, unspecified: Secondary | ICD-10-CM | POA: Diagnosis present

## 2017-10-25 DIAGNOSIS — J45909 Unspecified asthma, uncomplicated: Secondary | ICD-10-CM | POA: Diagnosis not present

## 2017-10-25 DIAGNOSIS — J4520 Mild intermittent asthma, uncomplicated: Secondary | ICD-10-CM | POA: Diagnosis present

## 2017-10-25 DIAGNOSIS — D649 Anemia, unspecified: Secondary | ICD-10-CM | POA: Diagnosis not present

## 2017-10-25 DIAGNOSIS — R072 Precordial pain: Secondary | ICD-10-CM | POA: Diagnosis not present

## 2017-10-25 DIAGNOSIS — G4733 Obstructive sleep apnea (adult) (pediatric): Secondary | ICD-10-CM

## 2017-10-25 DIAGNOSIS — Z7951 Long term (current) use of inhaled steroids: Secondary | ICD-10-CM | POA: Insufficient documentation

## 2017-10-25 DIAGNOSIS — I5030 Unspecified diastolic (congestive) heart failure: Secondary | ICD-10-CM

## 2017-10-25 DIAGNOSIS — R1310 Dysphagia, unspecified: Secondary | ICD-10-CM | POA: Diagnosis not present

## 2017-10-25 DIAGNOSIS — R110 Nausea: Secondary | ICD-10-CM | POA: Diagnosis not present

## 2017-10-25 DIAGNOSIS — E538 Deficiency of other specified B group vitamins: Secondary | ICD-10-CM | POA: Diagnosis not present

## 2017-10-25 DIAGNOSIS — R918 Other nonspecific abnormal finding of lung field: Secondary | ICD-10-CM | POA: Insufficient documentation

## 2017-10-25 DIAGNOSIS — R05 Cough: Secondary | ICD-10-CM | POA: Diagnosis not present

## 2017-10-25 DIAGNOSIS — Z87891 Personal history of nicotine dependence: Secondary | ICD-10-CM | POA: Diagnosis not present

## 2017-10-25 LAB — RETICULOCYTES
RBC.: 3.8 MIL/uL — ABNORMAL LOW (ref 3.87–5.11)
Retic Count, Absolute: 57 10*3/uL (ref 19.0–186.0)
Retic Ct Pct: 1.5 % (ref 0.4–3.1)

## 2017-10-25 LAB — RAPID URINE DRUG SCREEN, HOSP PERFORMED
Amphetamines: NOT DETECTED
Barbiturates: NOT DETECTED
Benzodiazepines: NOT DETECTED
Cocaine: NOT DETECTED
Opiates: POSITIVE — AB
Tetrahydrocannabinol: NOT DETECTED

## 2017-10-25 LAB — LIPID PANEL
Cholesterol: 189 mg/dL (ref 0–200)
HDL: 41 mg/dL (ref >40)
LDL Cholesterol: 138 mg/dL — ABNORMAL HIGH (ref 0–99)
Total CHOL/HDL Ratio: 4.6 RATIO
Triglycerides: 50 mg/dL (ref <150)
VLDL: 10 mg/dL (ref 0–40)

## 2017-10-25 LAB — BASIC METABOLIC PANEL
Anion gap: 8 (ref 5–15)
BUN: 14 mg/dL (ref 6–20)
CO2: 21 mmol/L — ABNORMAL LOW (ref 22–32)
Calcium: 9 mg/dL (ref 8.9–10.3)
Chloride: 111 mmol/L (ref 101–111)
Creatinine, Ser: 0.82 mg/dL (ref 0.44–1.00)
GFR calc Af Amer: >60 mL/min (ref >60)
GFR calc non Af Amer: >60 mL/min (ref >60)
Glucose, Bld: 112 mg/dL — ABNORMAL HIGH (ref 65–99)
Potassium: 3.7 mmol/L (ref 3.5–5.1)
Sodium: 140 mmol/L (ref 135–145)

## 2017-10-25 LAB — TROPONIN I
Troponin I: <0.03 ng/mL (ref <0.03)
Troponin I: <0.03 ng/mL (ref <0.03)
Troponin I: <0.03 ng/mL (ref <0.03)

## 2017-10-25 LAB — CBC
HCT: 34 % — ABNORMAL LOW (ref 36.0–46.0)
Hemoglobin: 10.5 g/dL — ABNORMAL LOW (ref 12.0–15.0)
MCH: 27.7 pg (ref 26.0–34.0)
MCHC: 30.9 g/dL (ref 30.0–36.0)
MCV: 89.7 fL (ref 78.0–100.0)
Platelets: 312 10*3/uL (ref 150–400)
RBC: 3.79 MIL/uL — ABNORMAL LOW (ref 3.87–5.11)
RDW: 15 % (ref 11.5–15.5)
WBC: 5.3 10*3/uL (ref 4.0–10.5)

## 2017-10-25 LAB — I-STAT TROPONIN, ED: Troponin i, poc: 0 ng/mL (ref 0.00–0.08)

## 2017-10-25 LAB — ECHOCARDIOGRAM COMPLETE
Height: 65 in
Weight: 5600 oz

## 2017-10-25 LAB — D-DIMER, QUANTITATIVE: D-Dimer, Quant: 0.82 ug/mL-FEU — ABNORMAL HIGH (ref 0.00–0.50)

## 2017-10-25 LAB — HEMOGLOBIN A1C
Hgb A1c MFr Bld: 5.3 % (ref 4.8–5.6)
Mean Plasma Glucose: 105.41 mg/dL

## 2017-10-25 LAB — TSH: TSH: 3.44 u[IU]/mL (ref 0.350–4.500)

## 2017-10-25 LAB — HIV ANTIBODY (ROUTINE TESTING W REFLEX): HIV Screen 4th Generation wRfx: NONREACTIVE

## 2017-10-25 LAB — IRON AND TIBC
Iron: 47 ug/dL (ref 28–170)
Saturation Ratios: 14 % (ref 10.4–31.8)
TIBC: 330 ug/dL (ref 250–450)
UIBC: 283 ug/dL

## 2017-10-25 LAB — VITAMIN B12: Vitamin B-12: 89 pg/mL — ABNORMAL LOW (ref 180–914)

## 2017-10-25 LAB — FOLATE: Folate: 8.9 ng/mL (ref >5.9)

## 2017-10-25 LAB — FERRITIN: Ferritin: 15 ng/mL (ref 11–307)

## 2017-10-25 LAB — BRAIN NATRIURETIC PEPTIDE: B Natriuretic Peptide: 18.4 pg/mL (ref 0.0–100.0)

## 2017-10-25 MED ORDER — LISINOPRIL 20 MG PO TABS
20.0000 mg | ORAL_TABLET | Freq: Every day | ORAL | Status: DC
  Administered 2017-10-25 – 2017-10-27 (×3): 20 mg via ORAL
  Filled 2017-10-25 (×3): qty 1

## 2017-10-25 MED ORDER — ACETAMINOPHEN 325 MG PO TABS: 650.0000 mg | ORAL_TABLET | ORAL | Status: DC | PRN

## 2017-10-25 MED ORDER — MORPHINE SULFATE (PF) 4 MG/ML IV SOLN: 2.0000 mg | INTRAVENOUS | Status: DC | PRN

## 2017-10-25 MED ORDER — MORPHINE SULFATE (PF) 4 MG/ML IV SOLN
1.0000 mg | INTRAVENOUS | Status: DC | PRN
  Administered 2017-10-25: 2 mg via INTRAVENOUS
  Filled 2017-10-25: qty 1

## 2017-10-25 MED ORDER — ASPIRIN EC 81 MG PO TBEC
81.0000 mg | DELAYED_RELEASE_TABLET | Freq: Every day | ORAL | Status: DC
  Administered 2017-10-25 – 2017-10-27 (×3): 81 mg via ORAL
  Filled 2017-10-25 (×3): qty 1

## 2017-10-25 MED ORDER — IOPAMIDOL (ISOVUE-370) INJECTION 76%
100.0000 mL | Freq: Once | INTRAVENOUS | Status: AC | PRN
  Administered 2017-10-25: 100 mL via INTRAVENOUS

## 2017-10-25 MED ORDER — DICLOFENAC SODIUM 1 % TD GEL
4.0000 g | Freq: Four times a day (QID) | TRANSDERMAL | Status: DC
  Administered 2017-10-25 – 2017-10-27 (×6): 4 g via TOPICAL
  Filled 2017-10-25 (×2): qty 100

## 2017-10-25 MED ORDER — ACETAMINOPHEN 325 MG PO TABS: 650.0000 mg | ORAL_TABLET | Freq: Four times a day (QID) | ORAL | Status: DC | PRN

## 2017-10-25 MED ORDER — MOMETASONE FURO-FORMOTEROL FUM 200-5 MCG/ACT IN AERO
2.0000 | INHALATION_SPRAY | Freq: Two times a day (BID) | RESPIRATORY_TRACT | Status: DC
  Administered 2017-10-25 – 2017-10-27 (×4): 2 via RESPIRATORY_TRACT
  Filled 2017-10-25: qty 8.8

## 2017-10-25 MED ORDER — SODIUM CHLORIDE 0.9 % IV SOLN
INTRAVENOUS | Status: DC
  Administered 2017-10-25 – 2017-10-27 (×3): via INTRAVENOUS

## 2017-10-25 MED ORDER — ENOXAPARIN SODIUM 40 MG/0.4ML ~~LOC~~ SOLN
40.0000 mg | SUBCUTANEOUS | Status: DC
  Administered 2017-10-26 – 2017-10-27 (×2): 40 mg via SUBCUTANEOUS
  Filled 2017-10-25 (×4): qty 0.4

## 2017-10-25 MED ORDER — TRAMADOL HCL 50 MG PO TABS
50.0000 mg | ORAL_TABLET | Freq: Three times a day (TID) | ORAL | Status: DC | PRN
  Administered 2017-10-25 – 2017-10-26 (×2): 100 mg via ORAL
  Administered 2017-10-26: 50 mg via ORAL
  Filled 2017-10-25: qty 1
  Filled 2017-10-25 (×2): qty 2

## 2017-10-25 MED ORDER — PANTOPRAZOLE SODIUM 40 MG PO TBEC: 40.0000 mg | DELAYED_RELEASE_TABLET | Freq: Every day | ORAL | Status: DC | PRN

## 2017-10-25 MED ORDER — MONTELUKAST SODIUM 10 MG PO TABS: 10.0000 mg | ORAL_TABLET | Freq: Every day | ORAL | Status: DC | PRN

## 2017-10-25 MED ORDER — NITROGLYCERIN 0.4 MG SL SUBL: 0.4000 mg | SUBLINGUAL_TABLET | SUBLINGUAL | Status: DC | PRN

## 2017-10-25 MED ORDER — LISINOPRIL-HYDROCHLOROTHIAZIDE 20-25 MG PO TABS: 1.0000 | ORAL_TABLET | Freq: Every day | ORAL | Status: DC

## 2017-10-25 MED ORDER — FLUTICASONE PROPIONATE 50 MCG/ACT NA SUSP
1.0000 | Freq: Two times a day (BID) | NASAL | Status: DC | PRN
  Filled 2017-10-25: qty 16

## 2017-10-25 MED ORDER — HYDROCHLOROTHIAZIDE 25 MG PO TABS
25.0000 mg | ORAL_TABLET | Freq: Every day | ORAL | Status: DC
  Administered 2017-10-25 – 2017-10-27 (×3): 25 mg via ORAL
  Filled 2017-10-25 (×3): qty 1

## 2017-10-25 MED ORDER — ALBUTEROL SULFATE HFA 108 (90 BASE) MCG/ACT IN AERS: 1.0000 | INHALATION_SPRAY | Freq: Four times a day (QID) | RESPIRATORY_TRACT | Status: DC | PRN

## 2017-10-25 MED ORDER — IOPAMIDOL (ISOVUE-370) INJECTION 76%
INTRAVENOUS | Status: AC
  Filled 2017-10-25: qty 100

## 2017-10-25 MED ORDER — NITROGLYCERIN 2 % TD OINT
1.0000 [in_us] | TOPICAL_OINTMENT | Freq: Once | TRANSDERMAL | Status: AC
  Administered 2017-10-25: 1 [in_us] via TOPICAL
  Filled 2017-10-25: qty 1

## 2017-10-25 MED ORDER — CYANOCOBALAMIN 1000 MCG/ML IJ SOLN
1000.0000 ug | Freq: Once | INTRAMUSCULAR | Status: AC
  Administered 2017-10-27: 1000 ug via INTRAMUSCULAR
  Filled 2017-10-25 (×2): qty 1

## 2017-10-25 MED ORDER — HYDROCODONE-ACETAMINOPHEN 5-325 MG PO TABS
1.0000 | ORAL_TABLET | Freq: Once | ORAL | Status: AC
  Administered 2017-10-25: 1 via ORAL
  Filled 2017-10-25: qty 1

## 2017-10-25 MED ORDER — ALBUTEROL SULFATE (2.5 MG/3ML) 0.083% IN NEBU: 2.5000 mg | INHALATION_SOLUTION | RESPIRATORY_TRACT | Status: DC | PRN

## 2017-10-25 MED ORDER — CETIRIZINE HCL 10 MG PO TABS: 10.0000 mg | ORAL_TABLET | Freq: Every day | ORAL | Status: DC | PRN

## 2017-10-25 MED ORDER — TIZANIDINE HCL 4 MG PO TABS
2.0000 mg | ORAL_TABLET | Freq: Three times a day (TID) | ORAL | Status: DC | PRN
  Administered 2017-10-27 (×2): 2 mg via ORAL
  Filled 2017-10-25 (×2): qty 1

## 2017-10-25 MED ORDER — ONDANSETRON HCL 4 MG/2ML IJ SOLN
4.0000 mg | Freq: Four times a day (QID) | INTRAMUSCULAR | Status: DC | PRN
  Administered 2017-10-25: 4 mg via INTRAVENOUS
  Filled 2017-10-25: qty 2

## 2017-10-25 NOTE — ED Triage Notes (Signed)
Pt was in the kitchen tonight, started having chest tightness with numbness to arm. Pt given 324mg  ASA and nitro x 1 without improvement of pain.

## 2017-10-25 NOTE — ED Notes (Signed)
Family at bedside. 

## 2017-10-25 NOTE — Care Management (Signed)
This is a no charge note  Pending admission per Dr. Wilkie AyeHorton.  62 year old lady with a past medical history of morbid obesity, hypertension, hyperlipidemia, asthma, GERD, depression, anemia, dCHF, who presents with chest pain started at 10 PM. Initially 7 out of 10 in severity, subsided to 0/10 after treated with aspirin 324 mg and nitroglycerin patch. Initial trop negative. EKG showed T flattening and nonspecific change (see Dr. Winnifred FriarHorton's note for scanned EKG). D-dimer 0.82. CTA is poor in quality, but did not show central PE, with questionable filling defect in the left lower lobe branches). Pt does not have shortness of breath or oxygen saturation. I did not start IV heparin. Patient is placed on telemetry bed for observation.  Lorretta HarpXilin Liara Holm, MD  [facility] Pager [phone]  If 7PM-7AM, please contact night-coverage [email or web address] Password [email or web address] 10/25/2017, 6:14 AM

## 2017-10-25 NOTE — Consult Note (Addendum)
Cardiology Consultation:   Patient ID: [name]; [id number]; August 14, 1955   Admit date: 10/25/2017 Date of Consult: 10/25/2017  Primary Care Provider: [name], [name], MD Primary Cardiologist: Dr Mayford Knife   Patient Profile:   [name] is a 62 y.o. female with a hx of HTN, HLD, and morbid obesity who is being seen today for the evaluation of chest pain at the request of [name].  History of Present Illness:   [name] is a pleasant 62 y/o morbidly obese (BMI 69) AA female who was seen by cardiology in Sept 2017 for chest pain. An echo and Myoview then were without significant abnormality. Her chest pain then had some atypical features but she had positive risk factors and an abnormal EKG with NSST changes. We have not seen her since her stress test Oct 2017.  She presented to the ED just after midnight last night with complaints of left sided chest pain. She describes a sharp shooting pain Lt upper chest lasting "seconds".  She initially noted it around supper time but after she went to bed she had a "bad" episode causing her to clutch her chest. She had some associated nausea with this. Movement and deep inspiration made her pain worse. EMS was called. The pt thinks the SL NTG did help her symptoms. On admission to the ED her D-dimer was elevated. A chest CT was negative for PE but showed "mild" coronary Ca++.    Past Medical History:  Diagnosis Date  . Abnormal EKG 06/2003   History of inverted T waves V1-V3. Normal 2D echo (07/23/2003): LVEF 65%.  . Anemia    BL Hgb 11-12. Ferritin 14 - low normal (08/2007). Colonoscopy 2009 - external hemorrhoids (excellent prep). Last anemia panel (12/2010) - Iron  24, TIBC 269,  B12  316, Folate 11.9, Ferritin 73.  . Asthma   . Degenerative joint disease    BL knees (L>R), lumbar spine. Followed by Sports Medicine, Dr. Jennette Kettle.  . History of multiple pulmonary nodules    Incidental finding: CT Abd/ Pelvis (04/2010) - Several small lower lobe lung  nodules, including one pure ground-glass pulmonary nodule measuring 8 mm in the left lower lobe.  Recommend follow-up chest CT (IV contrast preferred) in 6 months to document stability. //  CT Abd/ Pelvis (07/2010) -  3 mm RLL and  8 mm LLL nodule stable.  Other nodules unchanged, likely benign.  . Hyperlipidemia   . Hypertension   . Incarcerated ventral hernia 04/2010   Noted on CT Abd/ pelvis (04/2010). Patient now s/p ventral hernia repair by Dr. Gerrit Friends (12/2010)  . 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 Lower extremity edema    Chronic. 2D echo (2005) - EF 65%.  . Obesity   . Seasonal allergies   . Wears dentures   . Wears glasses     Past Surgical History:  Procedure Laterality Date  . COLONOSCOPY  2009  . FOOT SURGERY  2004    left  . INCISION AND DRAINAGE ABSCESS ANAL  07/2008   I&D and debridgement of anorectal abscess  . INCISIONAL HERNIA REPAIR  12/2010   Repair of incarcerated ventral incisional hernia with Ethicon mesh patch - performed by Dr. Gerrit Friends.   . INGUINAL HERNIA REPAIR    . JOINT REPLACEMENT Left 2013   left knee  . TOTAL KNEE ARTHROPLASTY  06/16/2011   Left TOTAL KNEE ARTHROPLASTY;  Surgeon: Kennieth Rad;  Location: [facility] OR;  Service:  Orthopedics;  Laterality: Left;  left total knee arthroplasty  . TUBAL LIGATION       Home Medications:  Prior to Admission medications   Medication Sig Start Date End Date Taking? Authorizing Provider  acetaminophen (TYLENOL) 500 MG tablet Take 1 tablet (500 mg total) by mouth every 6 (six) hours as needed. Patient taking differently: Take 500 mg by mouth every 6 (six) hours as needed for mild pain.  03/24/17  Yes [name], [name], MD  albuterol (PROVENTIL HFA;VENTOLIN HFA) 108 (90 Base) MCG/ACT inhaler Inhale 1-2 puffs into the lungs every 6 (six) hours as needed for wheezing or shortness of breath. 12/16/16  Yes [name], [name], MD  budesonide-formoterol [facility]) 160-4.5 MCG/ACT inhaler Inhale 2  puffs into the lungs 2 (two) times daily. 12/06/16  Yes [name], [name], MD  diclofenac sodium (VOLTAREN) 1 % GEL Apply 4 g topically 4 (four) times daily. 03/20/17  Yes [name], [name], MD  DULoxetine (CYMBALTA) 30 MG capsule Take 1 capsule (30 mg total) by mouth daily. Patient taking differently: Take 30 mg by mouth daily as needed (pain).  09/29/17  Yes [name], [name], MD  fluticasone [facility]) 50 MCG/ACT nasal spray Place 1 spray into both nostrils 2 (two) times daily. Patient taking differently: Place 1 spray into both nostrils 2 (two) times daily as needed for allergies.  12/02/16  Yes [name], [name], MD  furosemide (LASIX) 20 MG tablet Take 1 tablet (20 mg total) by mouth daily as needed. Patient taking differently: Take 20 mg by mouth daily as needed for fluid.  09/29/17  Yes [name], [name], MD  levocetirizine (XYZAL) 5 MG tablet Take 1 tablet (5 mg total) by mouth every evening. Patient taking differently: Take 5 mg by mouth daily as needed for allergies.  12/06/16  Yes [name], [name], MD  lisinopril-hydrochlorothiazide (PRINZIDE,ZESTORETIC) 20-25 MG tablet Take 1 tablet by mouth daily. 03/20/17  Yes [name], [name], MD  montelukast (SINGULAIR) 10 MG tablet Take 1 tablet (10 mg total) by mouth at bedtime. Patient taking differently: Take 10 mg by mouth daily as needed (Asthma).  12/06/16  Yes [name], [name], MD  pantoprazole (PROTONIX) 40 MG tablet Take 1 tablet (40 mg total) by mouth daily. Patient taking differently: Take 40 mg by mouth daily as needed (reflux).  07/05/16  Yes Veryl Speak, FNP  tiZANidine (ZANAFLEX) 2 MG tablet Take 1 tablet (2 mg total) by mouth every 8 (eight) hours as needed for muscle spasms. 09/29/17  Yes [name], [name], MD  traMADol (ULTRAM) 50 MG tablet Take 1-2 tablets (50-100 mg total) by mouth 3 (three) times daily as needed. Patient taking differently: Take 50-100 mg by mouth 3 (three) times daily as needed for moderate pain.  04/20/17  Yes Tarry Kos, MD   glycopyrrolate (ROBINUL) 2 MG tablet Take 1 tablet (2 mg total) by mouth 2 (two) times daily. Patient not taking: Reported on 09/27/2017 08/18/16   Iva Boop, MD    Inpatient Medications: Scheduled Meds: . diclofenac sodium  4 g Topical QID  . enoxaparin (LOVENOX) injection  40 mg Subcutaneous Q24H  . iopamidol      . mometasone-formoterol  2 puff Inhalation BID   Continuous Infusions: . sodium chloride 75 mL/hr at 10/25/17 0738   PRN Meds: acetaminophen, acetaminophen, albuterol, cetirizine, fluticasone, montelukast, morphine injection, morphine injection, nitroGLYCERIN, ondansetron (ZOFRAN) IV, pantoprazole, tiZANidine, traMADol  Allergies:    Allergies  Allergen Reactions  . Ketorolac Tromethamine Shortness Of  Breath and Palpitations  . Atrovent [Ipratropium] Hives and Rash  . Latex Other (See Comments)    Powdered. Confirm type of reaction with patient.    Social History:   Social History   Socioeconomic History  . Marital status: Married    Spouse name: Not on file  . Number of children: 3  . Years of education: Not on file  . Highest education level: Not on file  Occupational History  . Not on file  Social Needs  . Financial resource strain: Not on file  . Food insecurity:    Worry: Not on file    Inability: Not on file  . Transportation needs:    Medical: Not on file    Non-medical: Not on file  Tobacco Use  . Smoking status: Former Smoker    Packs/day: 0.50    Years: 20.00    Pack years: 10.00    Types: Cigarettes    Last attempt to quit: 09/10/1998    Years since quitting: 19.1  . Smokeless tobacco: Never Used  . Tobacco comment: 62 yo   Substance and Sexual Activity  . Alcohol use: No    Alcohol/week: 0.0 oz  . Drug use: No  . Sexual activity: Yes    Partners: Male  Lifestyle  . Physical activity:    Days per week: Not on file    Minutes per session: Not on file  . Stress: Not on file  Relationships  . Social connections:    Talks on  phone: Not on file    Gets together: Not on file    Attends religious service: Not on file    Active member of club or organization: Not on file    Attends meetings of clubs or organizations: Not on file    Relationship status: Not on file  . Intimate partner violence:    Fear of current or ex partner: Not on file    Emotionally abused: Not on file    Physically abused: Not on file    Forced sexual activity: Not on file  Other Topics Concern  . Not on file  Social History Narrative  . Not on file    Family History:    Family History  Problem Relation Age of Onset  . Diabetes Mother   . Hypertension Mother   . Stroke Mother   . Dementia Father   . Heart disease Father   . Hypertension Sister   . Diabetes Brother   . Hypertension Brother   . Rashes / Skin problems Maternal Grandfather   . Heart attack Neg Hx      ROS:  Please see the history of present illness.  Recent OP antibiotic course (Bactrim) for back abscess. All other ROS reviewed and negative.     Physical Exam/Data:   Vitals:   10/25/17 0245 10/25/17 0430 10/25/17 0738 10/25/17 0739  BP: 138/82 134/79 119/62   Pulse: 78 80 77   Resp: 17 16 17    SpO2: 98% 98% 99%   Weight:    (!) 350 lb (158.8 kg)  Height:    5\' 5"  (1.651 m)   No intake or output data in the 24 hours ending 10/25/17 0925 Filed Weights   10/25/17 0739  Weight: (!) 350 lb (158.8 kg)   Body mass index is 58.24 kg/m.  General:  Well nourished, well developed, in no acute distress HEENT: normal Lymph: no adenopathy Neck: no JVD Endocrine:  No thryomegaly Vascular: No carotid bruits; FA pulses  2+ bilaterally without bruits  Cardiac:  normal S1, S2; RRR; no murmur  Lungs:  clear to auscultation bilaterally, no wheezing, rhonchi or rales  Abd: soft, nontender, no hepatomegaly  Ext: no edema Musculoskeletal:  No deformities, BUE and BLE strength normal and equal Skin: warm and dry  Neuro:  CNs 2-12 intact, no focal abnormalities  noted Psych:  Normal affect   EKG:  The EKG is pending Telemetry:  Telemetry was personally reviewed and demonstrates:  NSR  Laboratory Data:  Chemistry Recent Labs  Lab 10/25/17 0207  NA 140  K 3.7  CL 111  CO2 21*  GLUCOSE 112*  BUN 14  CREATININE 0.82  CALCIUM 9.0  GFRNONAA >60  GFRAA >60  ANIONGAP 8    No results for input(s): PROT, ALBUMIN, AST, ALT, ALKPHOS, BILITOT in the last 168 hours. Hematology Recent Labs  Lab 10/25/17 0207  WBC 5.3  RBC 3.79*  HGB 10.5*  HCT 34.0*  MCV 89.7  MCH 27.7  MCHC 30.9  RDW 15.0  PLT 312   Cardiac EnzymesNo results for input(s): TROPONINI in the last 168 hours.  Recent Labs  Lab 10/25/17 0210  TROPIPOC 0.00    BNPNo results for input(s): BNP, PROBNP in the last 168 hours.  DDimer  Recent Labs  Lab 10/25/17 0256  DDIMER 0.82*    Radiology/Studies:  Dg Chest 2 View  Result Date: 10/25/2017 CLINICAL DATA:  Chest tightness EXAM: CHEST - 2 VIEW COMPARISON:  09/25/2016 FINDINGS: Borderline heart size. No acute airspace disease or pleural effusion. No pneumothorax. Aortic atherosclerosis. IMPRESSION: No active cardiopulmonary disease.  Borderline cardiomegaly. Electronically Signed   By: Jasmine PangKim  Fujinaga M.D.   On: 10/25/2017 01:21   Ct Angio Chest Pe W And/or Wo Contrast  Result Date: 10/25/2017 CLINICAL DATA:  Acute onset of generalized chest tightness and arm numbness. Elevated D-dimer. EXAM: CT ANGIOGRAPHY CHEST WITH CONTRAST TECHNIQUE: Multidetector CT imaging of the chest was performed using the standard protocol during bolus administration of intravenous contrast. Multiplanar CT image reconstructions and MIPs were obtained to evaluate the vascular anatomy. CONTRAST:  100mL ISOVUE-370 IOPAMIDOL (ISOVUE-370) INJECTION 76% COMPARISON:  Chest radiograph performed earlier today at 1:05 a.m., and CTA of the chest performed 06/24/2014 FINDINGS: Cardiovascular: There is question of filling defect within the pulmonary artery  branches to the left lower lobe, though this may be artifactual in nature given limitations in the timing of the contrast bolus. No central pulmonary embolus is seen. The heart is normal in size. Calcification is noted at the mitral valve. Mild coronary artery calcification is noted. Calcification is noted at the aortic arch and proximal great vessels. Mediastinum/Nodes: No pericardial effusion is seen. No mediastinal lymphadenopathy is appreciated. The visualized portions of the thyroid gland are unremarkable. No axillary lymphadenopathy is identified. Lungs/Pleura: A 7 mm nodule is again noted at the left lung base (image eighty-eight of 129). Smaller right-sided pulmonary nodules are again noted. These are relatively stable from 2015 and likely benign. No pleural effusion or pneumothorax is seen. No suspicious masses are identified. Upper Abdomen: The visualized portions of the liver and spleen are unremarkable. The visualized portions of the pancreas and gallbladder are within normal limits. Musculoskeletal: No acute osseous abnormalities are identified. The visualized musculature is unremarkable in appearance. Review of the MIP images confirms the above findings. IMPRESSION: 1. No central pulmonary embolus seen. Question of filling defect within the pulmonary artery branches to the left lower lobe, though this may be artifactual in nature given limitations  in the timing of the contrast bolus. 2. Small bilateral pulmonary nodules are likely benign, given stability from 2015. 3. Mild coronary artery calcification. Calcification at the mitral valve. These results were called by telephone at the time of interpretation on 10/25/2017 at 5:24 am to Sharilyn Sites PA, who verbally acknowledged these results. Electronically Signed   By: Roanna Raider M.D.   On: 10/25/2017 05:27    Assessment and Plan:   1. Atypical Chest Pain  - Some typical but mostly atypical features. Would cycle enzymes before deciding on  further work up. Super morbid obesity does complicate evaluation.   2. HLD - Lipid Panel with LDL of 132 Sept 2017 - Not currently on statin therapy. Followed by PCP.  3. HTN - BP well-controlled. Continue current medication regimen.   4. Super morbid obesity - BMI 59  5. Sleep apnea - pt says she is using her C-pap  6. CAD -Mild coronary Ca++ seen on CTA  Plan: Consider trial of NSAID. Will review further testing with MD, Myoview would have to be two day protocol, echo for WMA may be difficult secondary to her size as would coronary CT. Doubt coronary angiogram indicated unless she rules in.    For questions or updates, please contact [facility] Please consult [email or web address] for contact info under Cardiology/STEMI.   Signed, Corine Shelter, PA-C  10/25/2017 9:25 AM   History and all data above reviewed.  Patient examined.  I agree with the findings as above.  The patient presents with sharp and dull chest pain that started at rest.  This was worse with certain movements.  She presented the ED with findings and history as above.  I agree with documentation.  No objective evidence of ischemia.  EKG no acute ST changes and enzymes negative.  Pain is atypical greater than typical even though the pain did ease slowly after NTG paste The patient exam reveals COR:RRR  ,  Lungs: Clear  ,  Abd: Positive bowel sounds, no rebound no guarding, Ext No edema  .  All available labs, radiology testing, previous records reviewed. Agree with documented assessment and plan.   Chest pain:  Atypical.  No objective evidence if ischemia.  I would suggest cycling another set of enzymes.  If negative no further in patient work up and she could follow up in our office and be considered for non invasive testing if she has recurrent otherwise unexplained pain.      Alexiz Sustaita  11:00 AM  10/25/2017

## 2017-10-25 NOTE — ED Provider Notes (Signed)
[facility] EMERGENCY DEPARTMENT Provider Note   CSN: [id number] Arrival date & time: 10/25/17  0057     History   Chief Complaint Chief Complaint  Patient presents with  . Chest Pain    HPI [name] is a 62 y.o. female.  HPI  This is a 62 year old female with a history of hypertension, hyperlipidemia, obesity who presents with chest pain.  Patient reports that she was in the kitchen tonight at approximately 10 PM when she had anterior chest pain.  She describes it as sharp.  She reports dull aching in her left shoulder.  Currently she rates her pain at 7 out of 10.  She took an aspirin and a nitroglycerin without significant improvement.  No known history of heart disease.  Denies any recent fevers.  Has a nonproductive cough.  She is not a smoker.  No history of leg swelling or blood clots.  Past Medical History:  Diagnosis Date  . Abnormal EKG 06/2003   History of inverted T waves V1-V3. Normal 2D echo (07/23/2003): LVEF 65%.  . Anemia    BL Hgb 11-12. Ferritin 14 - low normal (08/2007). Colonoscopy 2009 - external hemorrhoids (excellent prep). Last anemia panel (12/2010) - Iron  24, TIBC 269,  B12  316, Folate 11.9, Ferritin 73.  . Asthma   . Degenerative joint disease    BL knees (L>R), lumbar spine. Followed by Sports Medicine, Dr. Jennette Kettle.  . History of multiple pulmonary nodules    Incidental finding: CT Abd/ Pelvis (04/2010) - Several small lower lobe lung nodules, including one pure ground-glass pulmonary nodule measuring 8 mm in the left lower lobe.  Recommend follow-up chest CT (IV contrast preferred) in 6 months to document stability. //  CT Abd/ Pelvis (07/2010) -  3 mm RLL and  8 mm LLL nodule stable.  Other nodules unchanged, likely benign.  . Hyperlipidemia   . Hypertension   . Incarcerated ventral hernia 04/2010   Noted on CT Abd/ pelvis (04/2010). Patient now s/p ventral hernia repair by Dr. Gerrit Friends (12/2010)  . Knee osteoarthritis    s/p  Left total knee replacement (06/2011)  . Lower extremity edema    Chronic. 2D echo (2005) - EF 65%.  . Obesity   . Seasonal allergies   . Wears dentures   . Wears glasses     Patient Active Problem List   Diagnosis Date Noted  . Chest pain 10/25/2017  . Depression, major, single episode, moderate (HCC) 09/29/2017  . Obstructive sleep apnea 12/22/2016  . 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 B12 deficiency 09/27/2016  . Hyperlipidemia 04/02/2012  . Class 3 obesity with serious comorbidity and body mass index (BMI) of 50.0 to 59.9 in adult 11/21/2011  . Lower abdominal pain 12/30/2010  . Preventative health care 09/10/2010  . LUNG NODULE 05/10/2010  . Low back pain 01/20/2009  . Osteoarthritis 06/02/2008  . HYPERLIPIDEMIA 08/15/2006  . ANEMIA-NOS 08/15/2006  . Essential hypertension 08/11/2006  . Allergic rhinitis 08/11/2006  . Asthma 08/11/2006    Past Surgical History:  Procedure Laterality Date  . COLONOSCOPY  2009  . FOOT SURGERY  2004    left  . INCISION AND DRAINAGE ABSCESS ANAL  07/2008   I&D and debridgement of anorectal abscess  . INCISIONAL HERNIA REPAIR  12/2010   Repair of incarcerated ventral incisional hernia with Ethicon mesh patch - performed by Dr. Gerrit Friends.   . INGUINAL HERNIA REPAIR    .  JOINT REPLACEMENT Left 2013   left knee  . TOTAL KNEE ARTHROPLASTY  06/16/2011   Left TOTAL KNEE ARTHROPLASTY;  Surgeon: Kennieth Rad;  Location: [facility] OR;  Service: Orthopedics;  Laterality: Left;  left total knee arthroplasty  . TUBAL LIGATION       OB History   None      Home Medications    Prior to Admission medications   Medication Sig Start Date End Date Taking? Authorizing Provider  acetaminophen (TYLENOL) 500 MG tablet Take 1 tablet (500 mg total) by mouth every 6 (six) hours as needed. Patient taking differently: Take 500 mg by mouth every 6 (six) hours as needed for mild pain.  03/24/17  Yes [name], [name], MD  albuterol  (PROVENTIL HFA;VENTOLIN HFA) 108 (90 Base) MCG/ACT inhaler Inhale 1-2 puffs into the lungs every 6 (six) hours as needed for wheezing or shortness of breath. 12/16/16  Yes [name], [name], MD  budesonide-formoterol [facility]) 160-4.5 MCG/ACT inhaler Inhale 2 puffs into the lungs 2 (two) times daily. 12/06/16  Yes [name], [name], MD  diclofenac sodium (VOLTAREN) 1 % GEL Apply 4 g topically 4 (four) times daily. 03/20/17  Yes [name], [name], MD  DULoxetine (CYMBALTA) 30 MG capsule Take 1 capsule (30 mg total) by mouth daily. Patient taking differently: Take 30 mg by mouth daily as needed (pain).  09/29/17  Yes [name], [name], MD  fluticasone [facility]) 50 MCG/ACT nasal spray Place 1 spray into both nostrils 2 (two) times daily. Patient taking differently: Place 1 spray into both nostrils 2 (two) times daily as needed for allergies.  12/02/16  Yes [name], [name], MD  furosemide (LASIX) 20 MG tablet Take 1 tablet (20 mg total) by mouth daily as needed. Patient taking differently: Take 20 mg by mouth daily as needed for fluid.  09/29/17  Yes [name], [name], MD  levocetirizine (XYZAL) 5 MG tablet Take 1 tablet (5 mg total) by mouth every evening. Patient taking differently: Take 5 mg by mouth daily as needed for allergies.  12/06/16  Yes [name], [name], MD  lisinopril-hydrochlorothiazide (PRINZIDE,ZESTORETIC) 20-25 MG tablet Take 1 tablet by mouth daily. 03/20/17  Yes [name], [name], MD  montelukast (SINGULAIR) 10 MG tablet Take 1 tablet (10 mg total) by mouth at bedtime. Patient taking differently: Take 10 mg by mouth daily as needed (Asthma).  12/06/16  Yes [name], [name], MD  pantoprazole (PROTONIX) 40 MG tablet Take 1 tablet (40 mg total) by mouth daily. Patient taking differently: Take 40 mg by mouth daily as needed (reflux).  07/05/16  Yes Veryl Speak, FNP  tiZANidine (ZANAFLEX) 2 MG tablet Take 1 tablet (2 mg total) by mouth every 8 (eight) hours as needed for muscle spasms. 09/29/17  Yes [name],  [name], MD  traMADol (ULTRAM) 50 MG tablet Take 1-2 tablets (50-100 mg total) by mouth 3 (three) times daily as needed. Patient taking differently: Take 50-100 mg by mouth 3 (three) times daily as needed for moderate pain.  04/20/17  Yes Tarry Kos, MD  glycopyrrolate (ROBINUL) 2 MG tablet Take 1 tablet (2 mg total) by mouth 2 (two) times daily. Patient not taking: Reported on 09/27/2017 08/18/16   Iva Boop, MD    Family History Family History  Problem Relation Age of Onset  . Diabetes Mother   . Hypertension Mother   . Stroke Mother   . Dementia Father   . Heart disease Father   . Hypertension Sister   .  Diabetes Brother   . Hypertension Brother   . Rashes / Skin problems Maternal Grandfather   . Heart attack Neg Hx     Social History Social History   Tobacco Use  . Smoking status: Former Smoker    Packs/day: 0.50    Years: 20.00    Pack years: 10.00    Types: Cigarettes    Last attempt to quit: 09/10/1998    Years since quitting: 19.1  . Smokeless tobacco: Never Used  . Tobacco comment: 62 yo   Substance Use Topics  . Alcohol use: No    Alcohol/week: 0.0 oz  . Drug use: No     Allergies   Ketorolac tromethamine; Atrovent [ipratropium]; and Latex   Review of Systems Review of Systems  Constitutional: Negative for fever.  Respiratory: Positive for cough. Negative for shortness of breath.   Cardiovascular: Positive for chest pain. Negative for leg swelling.  Gastrointestinal: Positive for nausea. Negative for abdominal pain, diarrhea and vomiting.  Genitourinary: Negative for dysuria.  Neurological: Negative for numbness.  All other systems reviewed and are negative.    Physical Exam Updated Vital Signs BP 134/79   Pulse 80   Resp 16   LMP 09/09/2009   SpO2 98%   Physical Exam  Constitutional: She is oriented to person, place, and time. She appears well-developed and well-nourished.  Elderly obese, no acute distress  HENT:  Head:  Normocephalic and atraumatic.  Cardiovascular: Normal rate, regular rhythm, normal heart sounds and normal pulses.  Pulmonary/Chest: Effort normal. No respiratory distress. She has no wheezes.  Distant breath sounds, limited by body habitus  Abdominal: Soft. Bowel sounds are normal.  Musculoskeletal:  Trace bilateral lower extremity edema  Neurological: She is alert and oriented to person, place, and time.  Skin: Skin is warm and dry.  Psychiatric: She has a normal mood and affect.  Nursing note and vitals reviewed.    ED Treatments / Results  Labs (all labs ordered are listed, but only abnormal results are displayed) Labs Reviewed  BASIC METABOLIC PANEL - Abnormal; Notable for the following components:      Result Value   CO2 21 (*)    Glucose, Bld 112 (*)    All other components within normal limits  CBC - Abnormal; Notable for the following components:   RBC 3.79 (*)    Hemoglobin 10.5 (*)    HCT 34.0 (*)    All other components within normal limits  D-DIMER, QUANTITATIVE (NOT AT [facility]) - Abnormal; Notable for the following components:   D-Dimer, Quant 0.82 (*)    All other components within normal limits  TROPONIN I  TROPONIN I  TROPONIN I  BRAIN NATRIURETIC PEPTIDE  RAPID URINE DRUG SCREEN, HOSP PERFORMED  I-STAT TROPONIN, ED    EKG None    ED ECG REPORT   Date: 10/25/2017  Rate: 80  Rhythm: normal sinus rhythm  QRS Axis: normal  Intervals: normal  ST/T Wave abnormalities: normal  Conduction Disutrbances:none  Narrative Interpretation:   Old EKG Reviewed: unchanged  I have personally reviewed the EKG tracing and agree with the computerized printout as noted.   Radiology Dg Chest 2 View  Result Date: 10/25/2017 CLINICAL DATA:  Chest tightness EXAM: CHEST - 2 VIEW COMPARISON:  09/25/2016 FINDINGS: Borderline heart size. No acute airspace disease or pleural effusion. No pneumothorax. Aortic atherosclerosis. IMPRESSION: No active cardiopulmonary disease.   Borderline cardiomegaly. Electronically Signed   By: Jasmine Pang M.D.   On: 10/25/2017 01:21  Ct Angio Chest Pe W And/or Wo Contrast  Result Date: 10/25/2017 CLINICAL DATA:  Acute onset of generalized chest tightness and arm numbness. Elevated D-dimer. EXAM: CT ANGIOGRAPHY CHEST WITH CONTRAST TECHNIQUE: Multidetector CT imaging of the chest was performed using the standard protocol during bolus administration of intravenous contrast. Multiplanar CT image reconstructions and MIPs were obtained to evaluate the vascular anatomy. CONTRAST:  100mL ISOVUE-370 IOPAMIDOL (ISOVUE-370) INJECTION 76% COMPARISON:  Chest radiograph performed earlier today at 1:05 a.m., and CTA of the chest performed 06/24/2014 FINDINGS: Cardiovascular: There is question of filling defect within the pulmonary artery branches to the left lower lobe, though this may be artifactual in nature given limitations in the timing of the contrast bolus. No central pulmonary embolus is seen. The heart is normal in size. Calcification is noted at the mitral valve. Mild coronary artery calcification is noted. Calcification is noted at the aortic arch and proximal great vessels. Mediastinum/Nodes: No pericardial effusion is seen. No mediastinal lymphadenopathy is appreciated. The visualized portions of the thyroid gland are unremarkable. No axillary lymphadenopathy is identified. Lungs/Pleura: A 7 mm nodule is again noted at the left lung base (image eighty-eight of 129). Smaller right-sided pulmonary nodules are again noted. These are relatively stable from 2015 and likely benign. No pleural effusion or pneumothorax is seen. No suspicious masses are identified. Upper Abdomen: The visualized portions of the liver and spleen are unremarkable. The visualized portions of the pancreas and gallbladder are within normal limits. Musculoskeletal: No acute osseous abnormalities are identified. The visualized musculature is unremarkable in appearance. Review of  the MIP images confirms the above findings. IMPRESSION: 1. No central pulmonary embolus seen. Question of filling defect within the pulmonary artery branches to the left lower lobe, though this may be artifactual in nature given limitations in the timing of the contrast bolus. 2. Small bilateral pulmonary nodules are likely benign, given stability from 2015. 3. Mild coronary artery calcification. Calcification at the mitral valve. These results were called by telephone at the time of interpretation on 10/25/2017 at 5:24 am to Sharilyn SitesLisa Sanders PA, who verbally acknowledged these results. Electronically Signed   By: Roanna RaiderJeffery  Chang M.D.   On: 10/25/2017 05:27    Procedures Procedures (including critical care time)  Medications Ordered in ED Medications  iopamidol (ISOVUE-370) 76 % injection (has no administration in time range)  nitroGLYCERIN (NITROSTAT) SL tablet 0.4 mg (has no administration in time range)  morphine 4 MG/ML injection 2 mg (has no administration in time range)  0.9 %  sodium chloride infusion (has no administration in time range)  albuterol (PROVENTIL) (2.5 MG/3ML) 0.083% nebulizer solution 2.5 mg (has no administration in time range)  acetaminophen (TYLENOL) tablet 650 mg (has no administration in time range)  nitroGLYCERIN (NITROGLYN) 2 % ointment 1 inch (1 inch Topical Given 10/25/17 0305)  HYDROcodone-acetaminophen (NORCO/VICODIN) 5-325 MG per tablet 1 tablet (1 tablet Oral Given 10/25/17 0413)  iopamidol (ISOVUE-370) 76 % injection 100 mL (100 mLs Intravenous Contrast Given 10/25/17 0442)     Initial Impression / Assessment and Plan / ED Course  I have reviewed the triage vital signs and the nursing notes.  Pertinent labs & imaging results that were available during my care of the patient were reviewed by me and considered in my medical decision making (see chart for details).     Presents with chest pain.  She is overall nontoxic appearing on exam.  She is having active chest  pain.  EKG is nonischemic.  Vital signs are  reassuring.  She has risk factors for ACS including obesity, hypertension, hyperlipidemia.  Nitroglycerin paste was applied.  Patient had complete resolution of her symptoms.  D-dimer was also elevated.  CT scan was obtained but was poor quality.  Given risk factors, will admit for chest pain rule out.   Final Clinical Impressions(s) / ED Diagnoses   Final diagnoses:  Precordial pain    ED Discharge Orders    None       Dorrian Doggett, Mayer Masker, MD 10/25/17 [phone]

## 2017-10-25 NOTE — H&P (Addendum)
History and Physical    [name]:[id number] DOB: [birth year] DOA: 10/25/2017  **Will admit patient based on the expectation that the patient will need hospitalization/ hospital care that crosses at least 2 midnights  PCP: [name], [name], MD   Attending physician: Ophelia Charter  Patient coming from/Resides with: Private residence  Chief Complaint: Chest pain  HPI: [name] is a 62 y.o. female with medical history significant for morbid obesity, hypertension, dyslipidemia, sleep apnea noncompliant with CPAP.  Patient reports for the past several weeks she has been having dizziness and weakness with activity.  She saw her PCP last week.  The only adjustment of medications was the addition of prn Lasix in the context of reported increasing lower extremity edema.  Last night the patient had just finished preparing supper and with movement she noticed sharp pain across her left anterior chest radiating into the shoulder she described this as stabbing and having difficulty catching her breath and worsening with breathing.  She noted every time she made physical movement and turned her head that she would have this discomfort.  It was also associated with shortness of breath nausea and dizziness.  She reportedly was given aspirin and 1 nitroglycerin without improvement of pain.  Currently describes the pain as nagging and lingering when occurring.  Since arrival to the ER she has been given Vicodin tabs and 1 inch of nitroglycerin ointment has been placed with resolution of pain at rest.  Of note I was able to reproduce left anterior chest wall pain with palpation.  Medicare troponin was negative but d-dimer was slightly elevated at 0.82 so CTA of the chest was obtained no definitive central pulmonary embolus was seen although there was a question of a filling defect within the pulmonary artery branches to the left lower lobe though this may be artifactual in nature given limitations in the timing of  the contrast bolus.  Also reidentified were small bilateral pulmonary nodules likely benign since they have have been stable since 2015.  There is also noted to be mild coronary artery calcification and calcification of the mitral valve.  Of note patient has not been hypoxemic or tachycardic.  He does not have any peripheral edema.  ED Course:  Vital Signs: BP 119/62 (BP Location: Right Wrist)   Pulse 77   Resp 17   Ht 5\' 5"  (1.651 m)   Wt (!) 158.8 kg (350 lb)   LMP 09/09/2009   SpO2 99%   BMI 58.24 kg/m  CT angio of chest: As above Chest x-ray: Negative Lab data: Sodium 140, potassium 3.7, chloride 111, CO2 21, glucose 112, BUN 14, creatinine 0.82, anion gap of 8, poc: 0.00, white count 5300 differential not obtained, hemoglobin 10.5, platelets 312,000, d-dimer 0.82 Medications and treatments: 2% NTG paste 1 inch x1, Vicodin 5-3 25 1  tablet x1  Review of Systems:  In addition to the HPI above,  No Fever-chills, myalgias or other constitutional symptoms No Headache, changes with Vision or hearing, new weakness, tingling, numbness in any extremity, dysarthria or word finding difficulty, gait disturbance or imbalance, tremors or seizure activity No problems swallowing food or Liquids, indigestion/reflux, choking or coughing while eating, abdominal pain with or after eating No Cough, palpitations, orthopnea or DOE No Abdominal pain, emesis, melena,hematochezia, dark tarry stools, constipation No dysuria, malodorous urine, hematuria or flank pain No new skin rashes, lesions, masses or bruises, No new joint pains, aches, swelling or redness No recent unintentional weight gain or loss No  polyuria, polydypsia or polyphagia   Past Medical History:  Diagnosis Date  . Abnormal EKG 06/2003   History of inverted T waves V1-V3. Normal 2D echo (07/23/2003): LVEF 65%.  . Anemia    BL Hgb 11-12. Ferritin 14 - low normal (08/2007). Colonoscopy 2009 - external hemorrhoids (excellent prep). Last  anemia panel (12/2010) - Iron  24, TIBC 269,  B12  316, Folate 11.9, Ferritin 73.  . Asthma   . Degenerative joint disease    BL knees (L>R), lumbar spine. Followed by Sports Medicine, Dr. Jennette Kettle.  . History of multiple pulmonary nodules    Incidental finding: CT Abd/ Pelvis (04/2010) - Several small lower lobe lung nodules, including one pure ground-glass pulmonary nodule measuring 8 mm in the left lower lobe.  Recommend follow-up chest CT (IV contrast preferred) in 6 months to document stability. //  CT Abd/ Pelvis (07/2010) -  3 mm RLL and  8 mm LLL nodule stable.  Other nodules unchanged, likely benign.  . Hyperlipidemia   . Hypertension   . Incarcerated ventral hernia 04/2010   Noted on CT Abd/ pelvis (04/2010). Patient now s/p ventral hernia repair by Dr. Gerrit Friends (12/2010)  . 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 Lower extremity edema    Chronic. 2D echo (2005) - EF 65%.  . Obesity   . Seasonal allergies   . Wears dentures   . Wears glasses     Past Surgical History:  Procedure Laterality Date  . COLONOSCOPY  2009  . FOOT SURGERY  2004    left  . INCISION AND DRAINAGE ABSCESS ANAL  07/2008   I&D and debridgement of anorectal abscess  . INCISIONAL HERNIA REPAIR  12/2010   Repair of incarcerated ventral incisional hernia with Ethicon mesh patch - performed by Dr. Gerrit Friends.   . INGUINAL HERNIA REPAIR    . JOINT REPLACEMENT Left 2013   left knee  . TOTAL KNEE ARTHROPLASTY  06/16/2011   Left TOTAL KNEE ARTHROPLASTY;  Surgeon: Kennieth Rad;  Location: [facility] OR;  Service: Orthopedics;  Laterality: Left;  left total knee arthroplasty  . TUBAL LIGATION      Social History   Socioeconomic History  . Marital status: Married    Spouse name: Not on file  . Number of children: 3  . Years of education: Not on file  . Highest education level: Not on file  Occupational History  . Not on file  Social Needs  . Financial resource strain: Not on file  . Food  insecurity:    Worry: Not on file    Inability: Not on file  . Transportation needs:    Medical: Not on file    Non-medical: Not on file  Tobacco Use  . Smoking status: Former Smoker    Packs/day: 0.50    Years: 20.00    Pack years: 10.00    Types: Cigarettes    Last attempt to quit: 09/10/1998    Years since quitting: 19.1  . Smokeless tobacco: Never Used  . Tobacco comment: 62 yo   Substance and Sexual Activity  . Alcohol use: No    Alcohol/week: 0.0 oz  . Drug use: No  . Sexual activity: Yes    Partners: Male  Lifestyle  . Physical activity:    Days per week: Not on file    Minutes per session: Not on file  . Stress: Not on file  Relationships  . Social connections:  Talks on phone: Not on file    Gets together: Not on file    Attends religious service: Not on file    Active member of club or organization: Not on file    Attends meetings of clubs or organizations: Not on file    Relationship status: Not on file  . Intimate partner violence:    Fear of current or ex partner: Not on file    Emotionally abused: Not on file    Physically abused: Not on file    Forced sexual activity: Not on file  Other Topics Concern  . Not on file  Social History Narrative  . Not on file    Mobility: Utilizes a cane Work history: Not obtained   Allergies  Allergen Reactions  . Ketorolac Tromethamine Shortness Of Breath and Palpitations  . Atrovent [Ipratropium] Hives and Rash  . Latex Other (See Comments)    Powdered. Confirm type of reaction with patient.    Family History  Problem Relation Age of Onset  . Diabetes Mother   . Hypertension Mother   . Stroke Mother   . Dementia Father   . Heart disease Father   . Hypertension Sister   . Diabetes Brother   . Hypertension Brother   . Rashes / Skin problems Maternal Grandfather   . Heart attack Neg Hx      Prior to Admission medications   Medication Sig Start Date End Date Taking? Authorizing Provider    acetaminophen (TYLENOL) 500 MG tablet Take 1 tablet (500 mg total) by mouth every 6 (six) hours as needed. Patient taking differently: Take 500 mg by mouth every 6 (six) hours as needed for mild pain.  03/24/17  Yes [name], [name], MD  albuterol (PROVENTIL HFA;VENTOLIN HFA) 108 (90 Base) MCG/ACT inhaler Inhale 1-2 puffs into the lungs every 6 (six) hours as needed for wheezing or shortness of breath. 12/16/16  Yes [name], [name], MD  budesonide-formoterol [facility]) 160-4.5 MCG/ACT inhaler Inhale 2 puffs into the lungs 2 (two) times daily. 12/06/16  Yes [name], [name], MD  diclofenac sodium (VOLTAREN) 1 % GEL Apply 4 g topically 4 (four) times daily. 03/20/17  Yes [name], [name], MD  DULoxetine (CYMBALTA) 30 MG capsule Take 1 capsule (30 mg total) by mouth daily. Patient taking differently: Take 30 mg by mouth daily as needed (pain).  09/29/17  Yes [name], [name], MD  fluticasone [facility]) 50 MCG/ACT nasal spray Place 1 spray into both nostrils 2 (two) times daily. Patient taking differently: Place 1 spray into both nostrils 2 (two) times daily as needed for allergies.  12/02/16  Yes [name], [name], MD  furosemide (LASIX) 20 MG tablet Take 1 tablet (20 mg total) by mouth daily as needed. Patient taking differently: Take 20 mg by mouth daily as needed for fluid.  09/29/17  Yes [name], [name], MD  levocetirizine (XYZAL) 5 MG tablet Take 1 tablet (5 mg total) by mouth every evening. Patient taking differently: Take 5 mg by mouth daily as needed for allergies.  12/06/16  Yes [name], [name], MD  lisinopril-hydrochlorothiazide (PRINZIDE,ZESTORETIC) 20-25 MG tablet Take 1 tablet by mouth daily. 03/20/17  Yes [name], [name], MD  montelukast (SINGULAIR) 10 MG tablet Take 1 tablet (10 mg total) by mouth at bedtime. Patient taking differently: Take 10 mg by mouth daily as needed (Asthma).  12/06/16  Yes [name], [name], MD  pantoprazole (PROTONIX) 40 MG tablet Take 1 tablet (40 mg total) by mouth  daily. Patient taking differently: Take 40 mg by mouth daily as needed (reflux).  07/05/16  Yes Veryl Speak, FNP  tiZANidine (ZANAFLEX) 2 MG tablet Take 1 tablet (2 mg total) by mouth every 8 (eight) hours as needed for muscle spasms. 09/29/17  Yes [name], [name], MD  traMADol (ULTRAM) 50 MG tablet Take 1-2 tablets (50-100 mg total) by mouth 3 (three) times daily as needed. Patient taking differently: Take 50-100 mg by mouth 3 (three) times daily as needed for moderate pain.  04/20/17  Yes Tarry Kos, MD  glycopyrrolate (ROBINUL) 2 MG tablet Take 1 tablet (2 mg total) by mouth 2 (two) times daily. Patient not taking: Reported on 09/27/2017 08/18/16   Iva Boop, MD    Physical Exam: Vitals:   10/25/17 0245 10/25/17 0430 10/25/17 0738 10/25/17 0739  BP: 138/82 134/79 119/62   Pulse: 78 80 77   Resp: 17 16 17    SpO2: 98% 98% 99%   Weight:    (!) 158.8 kg (350 lb)  Height:    5\' 5"  (1.651 m)      Constitutional: NAD, calm, comfortable Eyes: PERRL, lids and conjunctivae normal ENMT: Mucous membranes are moist. Posterior pharynx clear of any exudate or lesions.Normal dentition.  Neck: normal, supple, no masses, no thyromegaly Respiratory: clear to auscultation bilaterally, no wheezing, no crackles. Normal respiratory effort. No accessory muscle use.  Cardiovascular: Regular rate and rhythm, no murmurs / rubs / gallops. No extremity edema. 2+ pedal pulses. No carotid bruits.  Left anterior chest pain reproducible with palpation over left anterior chest wall and movement of left upper extremity. Abdomen: no tenderness, no masses palpated. No hepatosplenomegaly. Bowel sounds positive.  Musculoskeletal: no clubbing / cyanosis. No joint deformity upper and lower extremities. Good ROM, no contractures. Normal muscle tone.  Skin: no rashes, lesions, ulcers. No induration Neurologic: CN 2-12 grossly intact. Sensation intact, DTR normal. Strength 5/5 x all 4 extremities.  Psychiatric:  Normal judgment and insight. Alert and oriented x 3. Normal mood.    Labs on Admission: I have personally reviewed following labs and imaging studies  CBC: Recent Labs  Lab 10/25/17 0207  WBC 5.3  HGB 10.5*  HCT 34.0*  MCV 89.7  PLT 312   Basic Metabolic Panel: Recent Labs  Lab 10/25/17 0207  NA 140  K 3.7  CL 111  CO2 21*  GLUCOSE 112*  BUN 14  CREATININE 0.82  CALCIUM 9.0   GFR: Estimated Creatinine Clearance: 112.5 mL/min (by C-G formula based on SCr of 0.82 mg/dL). Liver Function Tests: No results for input(s): AST, ALT, ALKPHOS, BILITOT, PROT, ALBUMIN in the last 168 hours. No results for input(s): LIPASE, AMYLASE in the last 168 hours. No results for input(s): AMMONIA in the last 168 hours. Coagulation Profile: No results for input(s): INR, PROTIME in the last 168 hours. Cardiac Enzymes: No results for input(s): CKTOTAL, CKMB, CKMBINDEX, TROPONINI in the last 168 hours. BNP (last 3 results) No results for input(s): PROBNP in the last 8760 hours. HbA1C: No results for input(s): HGBA1C in the last 72 hours. CBG: No results for input(s): GLUCAP in the last 168 hours. Lipid Profile: No results for input(s): CHOL, HDL, LDLCALC, TRIG, CHOLHDL, LDLDIRECT in the last 72 hours. Thyroid Function Tests: No results for input(s): TSH, T4TOTAL, FREET4, T3FREE, THYROIDAB in the last 72 hours. Anemia Panel: No results for input(s): VITAMINB12, FOLATE, FERRITIN, TIBC, IRON, RETICCTPCT in the last 72 hours. Urine analysis:    Component Value Date/Time  COLORURINE YELLOW 06/10/2016 2018   APPEARANCEUR CLEAR 06/10/2016 2018   LABSPEC 1.019 06/10/2016 2018   PHURINE 6.5 06/10/2016 2018   GLUCOSEU NEGATIVE 06/10/2016 2018   GLUCOSEU NEG mg/dL 16/10/960402/11/2007 54092119   HGBUR NEGATIVE 06/10/2016 2018   HGBUR negative 01/20/2009 1517   BILIRUBINUR NEGATIVE 06/10/2016 2018   BILIRUBINUR neg 09/16/2014 1054   KETONESUR NEGATIVE 06/10/2016 2018   PROTEINUR NEGATIVE 06/10/2016 2018    UROBILINOGEN 0.2 05/14/2015 2030   NITRITE NEGATIVE 06/10/2016 2018   LEUKOCYTESUR MODERATE (A) 06/10/2016 2018   Sepsis Labs: @LABRCNTIP (procalcitonin:4,lacticidven:4) )No results found for this or any previous visit (from the past 240 hour(s)).   Radiological Exams on Admission: Dg Chest 2 View  Result Date: 10/25/2017 CLINICAL DATA:  Chest tightness EXAM: CHEST - 2 VIEW COMPARISON:  09/25/2016 FINDINGS: Borderline heart size. No acute airspace disease or pleural effusion. No pneumothorax. Aortic atherosclerosis. IMPRESSION: No active cardiopulmonary disease.  Borderline cardiomegaly. Electronically Signed   By: Jasmine PangKim  Fujinaga M.D.   On: 10/25/2017 01:21   Ct Angio Chest Pe W And/or Wo Contrast  Result Date: 10/25/2017 CLINICAL DATA:  Acute onset of generalized chest tightness and arm numbness. Elevated D-dimer. EXAM: CT ANGIOGRAPHY CHEST WITH CONTRAST TECHNIQUE: Multidetector CT imaging of the chest was performed using the standard protocol during bolus administration of intravenous contrast. Multiplanar CT image reconstructions and MIPs were obtained to evaluate the vascular anatomy. CONTRAST:  100mL ISOVUE-370 IOPAMIDOL (ISOVUE-370) INJECTION 76% COMPARISON:  Chest radiograph performed earlier today at 1:05 a.m., and CTA of the chest performed 06/24/2014 FINDINGS: Cardiovascular: There is question of filling defect within the pulmonary artery branches to the left lower lobe, though this may be artifactual in nature given limitations in the timing of the contrast bolus. No central pulmonary embolus is seen. The heart is normal in size. Calcification is noted at the mitral valve. Mild coronary artery calcification is noted. Calcification is noted at the aortic arch and proximal great vessels. Mediastinum/Nodes: No pericardial effusion is seen. No mediastinal lymphadenopathy is appreciated. The visualized portions of the thyroid gland are unremarkable. No axillary lymphadenopathy is identified.  Lungs/Pleura: A 7 mm nodule is again noted at the left lung base (image eighty-eight of 129). Smaller right-sided pulmonary nodules are again noted. These are relatively stable from 2015 and likely benign. No pleural effusion or pneumothorax is seen. No suspicious masses are identified. Upper Abdomen: The visualized portions of the liver and spleen are unremarkable. The visualized portions of the pancreas and gallbladder are within normal limits. Musculoskeletal: No acute osseous abnormalities are identified. The visualized musculature is unremarkable in appearance. Review of the MIP images confirms the above findings. IMPRESSION: 1. No central pulmonary embolus seen. Question of filling defect within the pulmonary artery branches to the left lower lobe, though this may be artifactual in nature given limitations in the timing of the contrast bolus. 2. Small bilateral pulmonary nodules are likely benign, given stability from 2015. 3. Mild coronary artery calcification. Calcification at the mitral valve. These results were called by telephone at the time of interpretation on 10/25/2017 at 5:24 am to Sharilyn SitesLisa Sanders PA, who verbally acknowledged these results. Electronically Signed   By: Roanna RaiderJeffery  Chang M.D.   On: 10/25/2017 05:27    EKG: (Independently reviewed) was scanned into the EDP's note.  Ventricular rate 80 bpm with QTC 491 ms, normal R wave rotation, no evidence of LVH by voltage criteria, otherwise normal EKG  Assessment/Plan Principal Problem:   Chest pain -Patient presents with acute onset of  chest pain that was preceded by 2 weeks of dizziness and weakness with activity -Chest pain has both typical and atypical features noting sharp stabbing chest pain was associated with shortness of breath, nausea and dizziness with normal EKG and normal initial troponin but was also reproducible with anterior wall palpation which is more consistent with skeletal skeletal etiology -Family history significant for  CVA and hypertension but no MI history -Continue to cycle troponin -Patient had a low risk Myoview stress test in 2017 with preserved LV function -Cardiology consulted -We will pursue echocardiogram if recommended by cardiology -HEART Score for Major Cardiac Event History: Moderately suspicious (1 point) ECG: Normal (0 points) Age: 25 to 64 years (1 point) Risk factors: At least 3 risk factors or history of atherosclerotic disease (2 points) Troponin: Normal limit or below (0 points) Total: 4 points. 13% risk of MACE.  Active Problems:   Positive D dimer -CTA chest without any central PE but ? perfusion defect left lower lobe which was likely related to artifact based on radiologist interpretation -She is not tachycardic or hypoxemic making PE less likely    Asthma -Currently no active wheezing -Continue preadmission SABA and LABA    Hypertension -Initial blood pressure normotensive with systolic blood pressure dropping into the low 100s after application of Nitropaste -Hold preadmission ACE I/HCTZ combo for now    Left ventricular diastolic dysfunction -Documented per echocardiogram from 2017 -Patient reports recent issues with LE edema and was started on Lasix prn by PCP-no edema noted on today's exam    HLD -Not on omega-3 fatty acids or statin prior to admission -Obtain lipid panel; last lipid panel 2017 with LDL cholesterol 132    Morbid obesity with BMI of 50.0-59.9, adult -Weight reduction strategies to be addressed by PCP    OSA on CPAP -Noncompliant with CPAP    Normochromic anemia -Current hemoglobin 10.5 with baseline hemoglobin of 12.4-11.3 -Anemia panel and TSH    **Additional lab, imaging and/or diagnostic evaluation at discretion of supervising physician  DVT prophylaxis: Lovenox Code Status: Full Family Communication: Multiple family members at bedside Disposition Plan: Home Consults called: Cardiology/[facility] on-call physician    Russella Dar  ANP-BC [facility] Pager [phone]   If 7PM-7AM, please contact night-coverage [email or web address] Password [facility]  10/25/2017, 8:51 AM

## 2017-10-25 NOTE — ED Notes (Signed)
Food Tray delivered and at bedside.  

## 2017-10-25 NOTE — ED Notes (Signed)
Heart Healthy Breakfast Tray Ordered @ 0817-per RN-called by Marylene LandAngela

## 2017-10-25 NOTE — Progress Notes (Signed)
*  PRELIMINARY RESULTS* Echocardiogram 2D Echocardiogram has been performed.  Jeryl Columbialliott, Aileen Amore 10/25/2017, 4:30 PM

## 2017-10-25 NOTE — ED Notes (Signed)
Assumed care at this time

## 2017-10-26 DIAGNOSIS — E785 Hyperlipidemia, unspecified: Secondary | ICD-10-CM | POA: Diagnosis not present

## 2017-10-26 DIAGNOSIS — I10 Essential (primary) hypertension: Secondary | ICD-10-CM | POA: Diagnosis not present

## 2017-10-26 DIAGNOSIS — J45909 Unspecified asthma, uncomplicated: Secondary | ICD-10-CM | POA: Diagnosis not present

## 2017-10-26 DIAGNOSIS — E538 Deficiency of other specified B group vitamins: Secondary | ICD-10-CM

## 2017-10-26 DIAGNOSIS — R0789 Other chest pain: Secondary | ICD-10-CM | POA: Diagnosis not present

## 2017-10-26 DIAGNOSIS — I519 Heart disease, unspecified: Secondary | ICD-10-CM | POA: Diagnosis not present

## 2017-10-26 DIAGNOSIS — R079 Chest pain, unspecified: Secondary | ICD-10-CM | POA: Diagnosis not present

## 2017-10-26 LAB — GROUP A STREP BY PCR: Group A Strep by PCR: NOT DETECTED

## 2017-10-26 MED ORDER — ALBUTEROL SULFATE (2.5 MG/3ML) 0.083% IN NEBU
2.5000 mg | INHALATION_SOLUTION | Freq: Four times a day (QID) | RESPIRATORY_TRACT | Status: DC
  Administered 2017-10-26 – 2017-10-27 (×6): 2.5 mg via RESPIRATORY_TRACT
  Filled 2017-10-26 (×6): qty 3

## 2017-10-26 MED ORDER — AMOXICILLIN-POT CLAVULANATE 875-125 MG PO TABS
1.0000 | ORAL_TABLET | Freq: Two times a day (BID) | ORAL | Status: DC
  Administered 2017-10-26 – 2017-10-27 (×3): 1 via ORAL
  Filled 2017-10-26 (×4): qty 1

## 2017-10-26 NOTE — Progress Notes (Signed)
Pt educated about safety and importance of bed alarm during the night however pt refuses to be on bed alarm. Will continue to round on patient.   Isla Sabree, RN    

## 2017-10-26 NOTE — Progress Notes (Addendum)
PROGRESS NOTE  [name] ZOX:[id number] DOB: May 19, 1956 DOA: 10/25/2017 PCP: [name], [name], MD   LOS: 0 days   Brief Narrative / Interim history: 62 yo F with supermorbid obesity, HTN, asthma, OSA, who was admitted to the hospital on 4/17 with chest pain. Cardiology was consulted.   Assessment & Plan: Principal Problem:   Chest pain Active Problems:   Essential hypertension   Hyperlipidemia   Morbid obesity with BMI of 50.0-59.9, adult (HCC)   Asthma   Hypertension   OSA on CPAP   Left ventricular diastolic dysfunction   Positive D dimer   Normochromic anemia   Low vitamin B12 level   Chest pain -cardiology consulted, symptoms atypical -echo reassuring with EF 65-70%, grade 1 DD -reproducible with palpation -cardiac enzymes negative, EKG non ischemic  Dysphagia -patient with progressive sore throat that started last night, worse this morning, feels like her throat is swelling and has difficulties with swallowing solids -has tender LAD -few bouts of non productive cough -Augmentin x 3 days -continue to monitor given rapid symptom progression since last night  Weakness -complains of feeling generally weaker today -PT consult  Super morbid obesity -counseled for weight loss  HTN -stable, continue Lisinopril / HCTZ  Elevated D dimer -no central PE seen on CT scan  Asthma -slight end expiratory wheezing today, schedule albuterol   B12 deficiency -s/p IM B12 4/17. Needs weekly shots for a month then monthly  DVT prophylaxis: Lovenox Code Status: Full code Family Communication: daughter bedside Disposition Plan: home 1 day  Consultants:   Cardiology   Procedures:   2D echo  Study Conclusions - Left ventricle: The cavity size was normal. Wall thickness was increased in a pattern of mild LVH. Systolic function was vigorous. The estimated ejection fraction was in the range of 65% to 70%. Wall motion was normal; there were no regional wall motion  abnormalities. Doppler parameters are consistent with abnormal left ventricular relaxation (grade 1 diastolic dysfunction). - Mitral valve: Moderately calcified annulus.  Antimicrobials:  Augmentin 4/18 >>   Subjective: - has intermittent chest pain, no shortness of breath, no abdominal pain, nausea or vomiting. Complains of rapid progressing sore throat and painful neck swelling  Objective: Vitals:   10/26/17 0033 10/26/17 0425 10/26/17 0853 10/26/17 1200  BP: 114/62 (!) 117/56 (!) 100/50 (!) 113/51  Pulse: 71 (!) 58 61 (!) 57  Resp: 17 18  20   Temp: 98.2 F (36.8 C) 98 F (36.7 C)  98.1 F (36.7 C)  TempSrc: Oral Oral  Oral  SpO2: 98% 96% 97% 97%  Weight:  (!) 163.7 kg (360 lb 14.4 oz)    Height:        Intake/Output Summary (Last 24 hours) at 10/26/2017 1354 Last data filed at 10/26/2017 0500 Gross per 24 hour  Intake 1722.5 ml  Output 300 ml  Net 1422.5 ml   Filed Weights   10/25/17 2046 10/25/17 2100 10/26/17 0425  Weight: (!) 163.9 kg (361 lb 7 oz) (!) 164.1 kg (361 lb 11.2 oz) (!) 163.7 kg (360 lb 14.4 oz)    Examination:  Constitutional: NAD Eyes:  lids and conjunctivae normal ENMT: Mucous membranes are moist. No visible exudates, tender neck LAD Respiratory: clear to auscultation bilaterally, faint end expiratory wheezing, no crackles. Normal respiratory effort. No accessory muscle use.  Cardiovascular: Regular rate and rhythm, no murmurs / rubs / gallops. No LE edema. Abdomen: no tenderness. Bowel sounds positive.  Skin: no rashes Neurologic: CN 2-12 grossly intact.  Strength 5/5 in all 4.   Data Reviewed: I have independently reviewed following labs and imaging studies   CBC: Recent Labs  Lab 10/25/17 0207  WBC 5.3  HGB 10.5*  HCT 34.0*  MCV 89.7  PLT 312   Basic Metabolic Panel: Recent Labs  Lab 10/25/17 0207  NA 140  K 3.7  CL 111  CO2 21*  GLUCOSE 112*  BUN 14  CREATININE 0.82  CALCIUM 9.0   GFR: Estimated Creatinine Clearance:  114.8 mL/min (by C-G formula based on SCr of 0.82 mg/dL). Liver Function Tests: No results for input(s): AST, ALT, ALKPHOS, BILITOT, PROT, ALBUMIN in the last 168 hours. No results for input(s): LIPASE, AMYLASE in the last 168 hours. No results for input(s): AMMONIA in the last 168 hours. Coagulation Profile: No results for input(s): INR, PROTIME in the last 168 hours. Cardiac Enzymes: Recent Labs  Lab 10/25/17 0859 10/25/17 1208 10/25/17 1915  TROPONINI <0.03 <0.03 <0.03   BNP (last 3 results) No results for input(s): PROBNP in the last 8760 hours. HbA1C: Recent Labs    10/25/17 1208  HGBA1C 5.3   CBG: No results for input(s): GLUCAP in the last 168 hours. Lipid Profile: Recent Labs    10/25/17 1208  CHOL 189  HDL 41  LDLCALC 138*  TRIG 50  CHOLHDL 4.6   Thyroid Function Tests: Recent Labs    10/25/17 0859  TSH 3.440   Anemia Panel: Recent Labs    10/25/17 0859  VITAMINB12 89*  FOLATE 8.9  FERRITIN 15  TIBC 330  IRON 47  RETICCTPCT 1.5   Urine analysis:    Component Value Date/Time   COLORURINE YELLOW 06/10/2016 2018   APPEARANCEUR CLEAR 06/10/2016 2018   LABSPEC 1.019 06/10/2016 2018   PHURINE 6.5 06/10/2016 2018   GLUCOSEU NEGATIVE 06/10/2016 2018   GLUCOSEU NEG mg/dL 40/98/119102/11/2007 47822119   HGBUR NEGATIVE 06/10/2016 2018   HGBUR negative 01/20/2009 1517   BILIRUBINUR NEGATIVE 06/10/2016 2018   BILIRUBINUR neg 09/16/2014 1054   KETONESUR NEGATIVE 06/10/2016 2018   PROTEINUR NEGATIVE 06/10/2016 2018   UROBILINOGEN 0.2 05/14/2015 2030   NITRITE NEGATIVE 06/10/2016 2018   LEUKOCYTESUR MODERATE (A) 06/10/2016 2018   Sepsis Labs: Invalid input(s): PROCALCITONIN, LACTICIDVEN  No results found for this or any previous visit (from the past 240 hour(s)).    Radiology Studies: Dg Chest 2 View  Result Date: 10/25/2017 CLINICAL DATA:  Chest tightness EXAM: CHEST - 2 VIEW COMPARISON:  09/25/2016 FINDINGS: Borderline heart size. No acute airspace disease  or pleural effusion. No pneumothorax. Aortic atherosclerosis. IMPRESSION: No active cardiopulmonary disease.  Borderline cardiomegaly. Electronically Signed   By: Jasmine PangKim  Fujinaga M.D.   On: 10/25/2017 01:21   Ct Angio Chest Pe W And/or Wo Contrast  Result Date: 10/25/2017 CLINICAL DATA:  Acute onset of generalized chest tightness and arm numbness. Elevated D-dimer. EXAM: CT ANGIOGRAPHY CHEST WITH CONTRAST TECHNIQUE: Multidetector CT imaging of the chest was performed using the standard protocol during bolus administration of intravenous contrast. Multiplanar CT image reconstructions and MIPs were obtained to evaluate the vascular anatomy. CONTRAST:  100mL ISOVUE-370 IOPAMIDOL (ISOVUE-370) INJECTION 76% COMPARISON:  Chest radiograph performed earlier today at 1:05 a.m., and CTA of the chest performed 06/24/2014 FINDINGS: Cardiovascular: There is question of filling defect within the pulmonary artery branches to the left lower lobe, though this may be artifactual in nature given limitations in the timing of the contrast bolus. No central pulmonary embolus is seen. The heart is normal in size. Calcification is  noted at the mitral valve. Mild coronary artery calcification is noted. Calcification is noted at the aortic arch and proximal great vessels. Mediastinum/Nodes: No pericardial effusion is seen. No mediastinal lymphadenopathy is appreciated. The visualized portions of the thyroid gland are unremarkable. No axillary lymphadenopathy is identified. Lungs/Pleura: A 7 mm nodule is again noted at the left lung base (image eighty-eight of 129). Smaller right-sided pulmonary nodules are again noted. These are relatively stable from 2015 and likely benign. No pleural effusion or pneumothorax is seen. No suspicious masses are identified. Upper Abdomen: The visualized portions of the liver and spleen are unremarkable. The visualized portions of the pancreas and gallbladder are within normal limits. Musculoskeletal: No  acute osseous abnormalities are identified. The visualized musculature is unremarkable in appearance. Review of the MIP images confirms the above findings. IMPRESSION: 1. No central pulmonary embolus seen. Question of filling defect within the pulmonary artery branches to the left lower lobe, though this may be artifactual in nature given limitations in the timing of the contrast bolus. 2. Small bilateral pulmonary nodules are likely benign, given stability from 2015. 3. Mild coronary artery calcification. Calcification at the mitral valve. These results were called by telephone at the time of interpretation on 10/25/2017 at 5:24 am to Sharilyn Sites PA, who verbally acknowledged these results. Electronically Signed   By: Roanna Raider M.D.   On: 10/25/2017 05:27     Scheduled Meds: . albuterol  2.5 mg Nebulization Q6H  . amoxicillin-clavulanate  1 tablet Oral Q12H  . aspirin EC  81 mg Oral Daily  . cyanocobalamin  1,000 mcg Intramuscular Once  . diclofenac sodium  4 g Topical QID  . enoxaparin (LOVENOX) injection  40 mg Subcutaneous Q24H  . lisinopril  20 mg Oral Daily   And  . hydrochlorothiazide  25 mg Oral Daily  . mometasone-formoterol  2 puff Inhalation BID   Continuous Infusions: . sodium chloride 75 mL/hr at 10/25/17 2228     Pamella Pert, MD, PhD [facility] Pager [phone]  If 7PM-7AM, please contact night-coverage [email or web address] Password [email or web address] 10/26/2017, 1:54 PM

## 2017-10-26 NOTE — Care Management Obs Status (Signed)
MEDICARE OBSERVATION STATUS NOTIFICATION   Patient Details  Name: [name] MRN: [id number] Date of Birth: [birth year]   Medicare Observation Status Notification Given:  Yes    Lawerance Sabalebbie Carola Viramontes, RN 10/26/2017, 10:12 AM

## 2017-10-26 NOTE — Evaluation (Signed)
Physical Therapy Evaluation Patient Details Name: [name] MRN: [id number] DOB: May 05, 1956 Today's Date: 10/26/2017   History of Present Illness  Pt is a 62 y.o. female admitted 10/25/17 with c/o chest pain. Per cardiology, chest pain atypical and reproducible with palpitation, EKG non-ischemic; echo 4/16 with EF 65-70%. Elevated D dimer; CT shows no PE. PMH includes HTN, HLD, obesity, OSA on CPAP, asthma.     Clinical Impression  Pt presents with an overall decrease in functional mobility secondary to above. PTA, pt mod indep with SPC or RW, reports increased difficulty ascending/descending steps; intermittent assist from family for ADLs. Today, pt amb 45' with RW and intermittent min guard for balance. Limited by c/o fatigue and feeling lightheaded/uneasy. Seated BP 136/80, standing BP 134/78. Pt would benefit from continued acute PT services to maximize functional mobility and independence prior to d/c with HHPT services.     Follow Up Recommendations Home health PT;Supervision - Intermittent    Equipment Recommendations  None recommended by PT    Recommendations for Other Services OT consult     Precautions / Restrictions Precautions Precautions: Fall Restrictions Weight Bearing Restrictions: No      Mobility  Bed Mobility Overal bed mobility: Needs Assistance Bed Mobility: Supine to Sit;Sit to Supine     Supine to sit: HOB elevated;Modified independent (Device/Increase time) Sit to supine: Modified independent (Device/Increase time)   General bed mobility comments: Increased time and effort, but no physical assist required  Transfers Overall transfer level: Needs assistance Equipment used: Rolling walker (2 wheeled) Transfers: Sit to/from Stand Sit to Stand: Supervision         General transfer comment: Performed 3x sit<>stand from bed with RW and supervision for safety; educ on correct hand placement  Ambulation/Gait Ambulation/Gait assistance: Min  guard Ambulation Distance (Feet): 80 Feet Assistive device: Rolling walker (2 wheeled) Gait Pattern/deviations: Step-through pattern;Decreased stride length;Wide base of support Gait velocity: Decreased   General Gait Details: Slow, steady amb with RW and intermittent min guard for balance; 2x standing rest break secondary to fatigue and c/o lightheadedness. VSS  Stairs            Wheelchair Mobility    Modified Rankin (Stroke Patients Only)       Balance Overall balance assessment: Needs assistance   Sitting balance-Leahy Scale: Fair     Standing balance support: No upper extremity supported;Bilateral upper extremity supported Standing balance-Leahy Scale: Fair Standing balance comment: Can static stand with no UE support; dynamic stability improved with BUE support                             Pertinent Vitals/Pain Pain Assessment: 0-10 Pain Score: 5  Pain Location: Chest Pain Descriptors / Indicators: Sore Pain Intervention(s): Monitored during session    Home Living Family/patient expects to be discharged to:: Private residence Living Arrangements: Spouse/significant other;Children Available Help at Discharge: Family;Available PRN/intermittently Type of Home: House Home Access: Stairs to enter Entrance Stairs-Rails: Doctor, general practice of Steps: 5 Home Layout: Two level;1/2 bath on main level Home Equipment: Walker - 2 wheels;Cane - single point Additional Comments: Family works during day. Bedroom and bathroom on main floor, but shower is upstairs    Prior Function Level of Independence: Needs assistance   Gait / Transfers Assistance Needed: Mod indep with ambulation using SPC or RW depending on extent of L knee pain. Increased effort ascending stairs to shower (pt reports "I have to crawl up them")  ADL's / Homemaking Assistance Needed: Intermittent assist for ADLs        Hand Dominance        Extremity/Trunk Assessment    Upper Extremity Assessment Upper Extremity Assessment: Overall WFL for tasks assessed    Lower Extremity Assessment Lower Extremity Assessment: Generalized weakness       Communication   Communication: No difficulties  Cognition Arousal/Alertness: Awake/alert Behavior During Therapy: WFL for tasks assessed/performed Overall Cognitive Status: Within Functional Limits for tasks assessed                                        General Comments General comments (skin integrity, edema, etc.): Husband and daughter present during session    Exercises     Assessment/Plan    PT Assessment Patient needs continued PT services  PT Problem List Decreased strength;Decreased activity tolerance;Decreased balance;Decreased mobility       PT Treatment Interventions DME instruction;Gait training;Stair training;Functional mobility training;Therapeutic activities;Therapeutic exercise;Balance training;Patient/family education    PT Goals (Current goals can be found in the Care Plan section)  Acute Rehab PT Goals Patient Stated Goal: Return home PT Goal Formulation: With patient Time For Goal Achievement: 11/09/17 Potential to Achieve Goals: Good    Frequency Min 3X/week   Barriers to discharge        Co-evaluation               AM-PAC PT "6 Clicks" Daily Activity  Outcome Measure Difficulty turning over in bed (including adjusting bedclothes, sheets and blankets)?: A Little Difficulty moving from lying on back to sitting on the side of the bed? : A Little Difficulty sitting down on and standing up from a chair with arms (e.g., wheelchair, bedside commode, etc,.)?: A Little Help needed moving to and from a bed to chair (including a wheelchair)?: A Little Help needed walking in hospital room?: A Little Help needed climbing 3-5 steps with a railing? : A Lot 6 Click Score: 17    End of Session Equipment Utilized During Treatment: Gait belt Activity Tolerance:  Patient tolerated treatment well;Patient limited by fatigue Patient left: in bed;with call bell/phone within reach;with family/visitor present Nurse Communication: Mobility status PT Visit Diagnosis: Other abnormalities of gait and mobility (R26.89)    Time: [id number] PT Time Calculation (min) (ACUTE ONLY): 25 min   Charges:   PT Evaluation $PT Eval Moderate Complexity: 1 Mod PT Treatments $Therapeutic Activity: 8-22 mins   PT G Codes:       Ina HomesJaclyn Romond Pipkins, PT, DPT Acute Rehab Services  Pager: [phone]  Malachy ChamberJaclyn L Gethsemane Fischler 10/26/2017, 3:05 PM

## 2017-10-26 NOTE — Care Management Note (Signed)
Case Management Note  Patient Details  Name: [name] MRN: [id number] Date of Birth: 1955/11/13  Subjective/Objective:                 Spoke to patient at the bedside. [name] lives at home w family. Denies barriers to getting medications or getting to MD. Patient has walker and cane. NO CM needs identified.    Action/Plan:   Expected Discharge Date:                  Expected Discharge Plan:  Home/Self Care  In-House Referral:     Discharge planning Services  CM Consult  Post Acute Care Choice:    Choice offered to:     DME Arranged:    DME Agency:     HH Arranged:    HH Agency:     Status of Service:  Completed, signed off  If discussed at [employer] Length of Stay Meetings, dates discussed:    Additional Comments:  Lawerance SabalDebbie Chenika Nevils, RN 10/26/2017, 10:12 AM

## 2017-10-26 NOTE — Progress Notes (Addendum)
Nutrition Brief Note  RD consulted for assessment of nutritional needs/status. Per H&P, MD concerned about obesity.   Wt Readings from Last 15 Encounters:  10/26/17 (!) 360 lb 14.4 oz (163.7 kg)  09/29/17 (!) 363 lb 2 oz (164.7 kg)  09/27/17 (!) 363 lb 8 oz (164.9 kg)  09/22/17 (!) 359 lb 8 oz (163.1 kg)  09/15/17 (!) 360 lb 4 oz (163.4 kg)  07/07/17 (!) 360 lb 9.6 oz (163.6 kg)  03/30/17 (!) 350 lb 12.8 oz (159.1 kg)  03/20/17 (!) 349 lb 2 oz (158.4 kg)  12/22/16 (!) 339 lb 6.4 oz (154 kg)  12/16/16 (!) 336 lb 6 oz (152.6 kg)  12/02/16 (!) 332 lb 6 oz (150.8 kg)  10/12/16 (!) 331 lb (150.1 kg)  10/11/16 (!) 330 lb (149.7 kg)  09/27/16 (!) 337 lb (152.9 kg)  08/18/16 (!) 336 lb 6.4 oz (152.6 kg)   Antony Blackbirdngela W Blust is a 62 y.o. female with medical history significant for morbid obesity, hypertension, dyslipidemia, sleep apnea noncompliant with CPAP.  Patient reports for the past several weeks she has been having dizziness and weakness with activity.   Pt admitted with chest pain.  Spoke with pt and daughter at bedside. Pt reports that she did not eat much of her breakfast, due to a sore throat. She has been drinking a lot of liquids lately.   PTA, pt consumes 1 large meal per day (usally late afternoon or early evening). She also snacks multiple times throughout the day. Per her report, she has been modifying her diet over the past few weeks and has started making healthier choices. She used to consume more heavy foods and snack on chips, however, now meals consist of either grilled chicken salad or baked chicken, rice, and vegetables. She now snacks on fruit. She has limited soda intake and now consumes mainly water. She reports that she does not think she has lost weight since making this changes, but overall feels better when consuming healthier food choices. 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  Nutrition-Focused physical exam completed. Findings are no fat depletion, no muscle depletion, and mild edema.   Provided pt with support regarding making lifestyle changes and encouraged her to continue. Weight loss is not a desireable goal for an acute hospitalization, however, continue to recommend slow, moderate weight loss once pt recovers from hospitalization. If further work-up and/or intervention for obesity is desired, recommend referral to [facility] services and/or outpatient referral for bariatric service/medical weight loss management.   Labs reviewed: Hgb A1c: 5.3.   Body mass index is 60.06 kg/m. Patient meets criteria for extreme obesity, class III based on current BMI.   Current diet order is soft, patient is consuming approximately 50% of meals at this time. Labs and medications reviewed.   No nutrition interventions warranted at this time. If nutrition issues arise, please consult RD.   Palmyra Rogacki A. Mayford KnifeWilliams, RD, LDN, CDE Pager: [phone] After hours Pager: [phone]

## 2017-10-26 NOTE — Progress Notes (Addendum)
Progress Note  Patient Name: [name] Date of Encounter: 10/26/2017  Primary Cardiologist: Armanda Magic, MD   Subjective   Continue to have intermittent chest pain, no SOB. Has been having dysphagia since yesterday  Inpatient Medications    Scheduled Meds: . albuterol  2.5 mg Nebulization Q6H  . amoxicillin-clavulanate  1 tablet Oral Q12H  . aspirin EC  81 mg Oral Daily  . cyanocobalamin  1,000 mcg Intramuscular Once  . diclofenac sodium  4 g Topical QID  . enoxaparin (LOVENOX) injection  40 mg Subcutaneous Q24H  . lisinopril  20 mg Oral Daily   And  . hydrochlorothiazide  25 mg Oral Daily  . mometasone-formoterol  2 puff Inhalation BID   Continuous Infusions: . sodium chloride 75 mL/hr at 10/25/17 2228   PRN Meds: acetaminophen, acetaminophen, cetirizine, fluticasone, montelukast, morphine injection, nitroGLYCERIN, ondansetron (ZOFRAN) IV, pantoprazole, tiZANidine, traMADol   Vital Signs    Vitals:   10/26/17 0033 10/26/17 0425 10/26/17 0853 10/26/17 1200  BP: 114/62 (!) 117/56 (!) 100/50 (!) 113/51  Pulse: 71 (!) 58 61 (!) 57  Resp: 17 18  20   Temp: 98.2 F (36.8 C) 98 F (36.7 C)  98.1 F (36.7 C)  TempSrc: Oral Oral  Oral  SpO2: 98% 96% 97% 97%  Weight:  (!) 360 lb 14.4 oz (163.7 kg)    Height:        Intake/Output Summary (Last 24 hours) at 10/26/2017 1209 Last data filed at 10/26/2017 0500 Gross per 24 hour  Intake 1722.5 ml  Output 300 ml  Net 1422.5 ml   Filed Weights   10/25/17 2046 10/25/17 2100 10/26/17 0425  Weight: (!) 361 lb 7 oz (163.9 kg) (!) 361 lb 11.2 oz (164.1 kg) (!) 360 lb 14.4 oz (163.7 kg)    Telemetry    NSR without ventricular ectopy - Personally Reviewed  ECG    NSR with nonspecific changes - Personally Reviewed  Physical Exam   GEN: No acute distress.   Neck: No JVD Cardiac: RRR, no murmurs, rubs, or gallops.  Respiratory: Clear to auscultation bilaterally. GI: Soft, nontender, non-distended  MS: No edema;  No deformity. Neuro:  Nonfocal  Psych: Normal affect   Labs    Chemistry Recent Labs  Lab 10/25/17 0207  NA 140  K 3.7  CL 111  CO2 21*  GLUCOSE 112*  BUN 14  CREATININE 0.82  CALCIUM 9.0  GFRNONAA >60  GFRAA >60  ANIONGAP 8     Hematology Recent Labs  Lab 10/25/17 0207 10/25/17 0859  WBC 5.3  --   RBC 3.79* 3.80*  HGB 10.5*  --   HCT 34.0*  --   MCV 89.7  --   MCH 27.7  --   MCHC 30.9  --   RDW 15.0  --   PLT 312  --     Cardiac Enzymes Recent Labs  Lab 10/25/17 0859 10/25/17 1208 10/25/17 1915  TROPONINI <0.03 <0.03 <0.03    Recent Labs  Lab 10/25/17 0210  TROPIPOC 0.00     BNP Recent Labs  Lab 10/25/17 0859  BNP 18.4     DDimer  Recent Labs  Lab 10/25/17 0256  DDIMER 0.82*     Radiology    Dg Chest 2 View  Result Date: 10/25/2017 CLINICAL DATA:  Chest tightness EXAM: CHEST - 2 VIEW COMPARISON:  09/25/2016 FINDINGS: Borderline heart size. No acute airspace disease or pleural effusion. No pneumothorax. Aortic atherosclerosis. IMPRESSION: No active cardiopulmonary disease.  Borderline cardiomegaly. Electronically Signed   By: Jasmine PangKim  Fujinaga M.D.   On: 10/25/2017 01:21   Ct Angio Chest Pe W And/or Wo Contrast  Result Date: 10/25/2017 CLINICAL DATA:  Acute onset of generalized chest tightness and arm numbness. Elevated D-dimer. EXAM: CT ANGIOGRAPHY CHEST WITH CONTRAST TECHNIQUE: Multidetector CT imaging of the chest was performed using the standard protocol during bolus administration of intravenous contrast. Multiplanar CT image reconstructions and MIPs were obtained to evaluate the vascular anatomy. CONTRAST:  100mL ISOVUE-370 IOPAMIDOL (ISOVUE-370) INJECTION 76% COMPARISON:  Chest radiograph performed earlier today at 1:05 a.m., and CTA of the chest performed 06/24/2014 FINDINGS: Cardiovascular: There is question of filling defect within the pulmonary artery branches to the left lower lobe, though this may be artifactual in nature given  limitations in the timing of the contrast bolus. No central pulmonary embolus is seen. The heart is normal in size. Calcification is noted at the mitral valve. Mild coronary artery calcification is noted. Calcification is noted at the aortic arch and proximal great vessels. Mediastinum/Nodes: No pericardial effusion is seen. No mediastinal lymphadenopathy is appreciated. The visualized portions of the thyroid gland are unremarkable. No axillary lymphadenopathy is identified. Lungs/Pleura: A 7 mm nodule is again noted at the left lung base (image eighty-eight of 129). Smaller right-sided pulmonary nodules are again noted. These are relatively stable from 2015 and likely benign. No pleural effusion or pneumothorax is seen. No suspicious masses are identified. Upper Abdomen: The visualized portions of the liver and spleen are unremarkable. The visualized portions of the pancreas and gallbladder are within normal limits. Musculoskeletal: No acute osseous abnormalities are identified. The visualized musculature is unremarkable in appearance. Review of the MIP images confirms the above findings. IMPRESSION: 1. No central pulmonary embolus seen. Question of filling defect within the pulmonary artery branches to the left lower lobe, though this may be artifactual in nature given limitations in the timing of the contrast bolus. 2. Small bilateral pulmonary nodules are likely benign, given stability from 2015. 3. Mild coronary artery calcification. Calcification at the mitral valve. These results were called by telephone at the time of interpretation on 10/25/2017 at 5:24 am to Sharilyn SitesLisa Sanders PA, who verbally acknowledged these results. Electronically Signed   By: Roanna RaiderJeffery  Chang M.D.   On: 10/25/2017 05:27    Cardiac Studies   Echo 10/25/2017 LV EF: 65% -   70% Study Conclusions  - Left ventricle: The cavity size was normal. Wall thickness was   increased in a pattern of mild LVH. Systolic function was   vigorous. The  estimated ejection fraction was in the range of 65%   to 70%. Wall motion was normal; there were no regional wall   motion abnormalities. Doppler parameters are consistent with   abnormal left ventricular relaxation (grade 1 diastolic   dysfunction). - Mitral valve: Moderately calcified annulus.   Patient Profile     62 y.o. female with a hx of HTN, HLD, and morbid obesity who is being seen today for the evaluation of chest pain at the request of Dr. Clyde LundborgNiu  Assessment & Plan    1. Atypical chest pain  - Echo 10/25/2017 EF 65-70%, grade 1 DD  - continue to intermittent chest pain overnight, chest is sore with palpitation. Symptom atypical.   2. Dysphagia: she says has has been having intermittent bouts of dysphagia, most recent episode started yesterday. Did not notice obvious thrush with light.  - she has not eaten much today due to pain with swallowing  3. HTN: BP well controlled  4. HLD: lipid panel showed LDL 138, HDL 41  5. Super morbid obesity  6. Coronary calcification: low risk myoview 04/19/2016 EF 57%, no ischemia  7. OSA on CPAP  8. Elevated d-dimer: No central pulmonary embolus seen. Question of filling defect within the pulmonary artery branches to the left lower lobe, though this may be artifactual in nature given limitations in the timing of the contrast bolus.  For questions or updates, please contact [facility] Please consult [email or web address] for contact info under Cardiology/STEMI.      Ramond Dial, PA  10/26/2017, 12:09 PM    History and all data above reviewed.  Patient examined.  I agree with the findings as above. She has difficulty swallowing with a sore throat.  She has pain on her chest with palpation.  Enzymes are negative and echo is normal.  The patient exam reveals COR:RRR, no rub  Chest :  Tender to palpation  ,  Lungs: Decreased breath sounds without wheezing or crackles  ,  Abd: Positive bowel sounds, no rebound no guarding, Ext No edema  .   All available labs, radiology testing, previous records reviewed. Agree with documented assessment and plan. Chest pain:  Atypical  No suggestion of a cardiac etiology.  No further cardiac work up.  Please call us with further questions.    Fayrene Fearing Ayeshia Coppin  12:52 PM  10/26/2017

## 2017-10-27 DIAGNOSIS — I10 Essential (primary) hypertension: Secondary | ICD-10-CM

## 2017-10-27 DIAGNOSIS — R0789 Other chest pain: Secondary | ICD-10-CM | POA: Diagnosis not present

## 2017-10-27 DIAGNOSIS — R079 Chest pain, unspecified: Secondary | ICD-10-CM | POA: Diagnosis not present

## 2017-10-27 LAB — BASIC METABOLIC PANEL
Anion gap: 10 (ref 5–15)
BUN: 9 mg/dL (ref 6–20)
CO2: 19 mmol/L — ABNORMAL LOW (ref 22–32)
Calcium: 8.9 mg/dL (ref 8.9–10.3)
Chloride: 108 mmol/L (ref 101–111)
Creatinine, Ser: 0.72 mg/dL (ref 0.44–1.00)
GFR calc Af Amer: >60 mL/min (ref >60)
GFR calc non Af Amer: >60 mL/min (ref >60)
Glucose, Bld: 118 mg/dL — ABNORMAL HIGH (ref 65–99)
Potassium: 4.3 mmol/L (ref 3.5–5.1)
Sodium: 137 mmol/L (ref 135–145)

## 2017-10-27 MED ORDER — ENOXAPARIN SODIUM 80 MG/0.8ML ~~LOC~~ SOLN: 80.0000 mg | SUBCUTANEOUS | Status: DC

## 2017-10-27 MED ORDER — DICLOFENAC SODIUM 1 % TD GEL: 4.0000 g | Freq: Four times a day (QID) | TRANSDERMAL | 0 refills | Status: DC

## 2017-10-27 MED ORDER — ASPIRIN 81 MG PO TBEC: 81.0000 mg | DELAYED_RELEASE_TABLET | Freq: Every day | ORAL | Status: DC

## 2017-10-27 MED ORDER — AMOXICILLIN-POT CLAVULANATE 875-125 MG PO TABS: 1.0000 | ORAL_TABLET | Freq: Two times a day (BID) | ORAL | 0 refills | Status: DC

## 2017-10-27 NOTE — Progress Notes (Signed)
Pt giving discharge instructions with understanding. Pt has no questions at this time. Iv and monitor d/c. Family member awaiting for pickup at entrance.

## 2017-10-27 NOTE — Progress Notes (Signed)
PT Cancellation Note  Patient Details Name: [name] MRN: [id number] DOB: 12/18/1955   Cancelled Treatment:    Reason Eval/Treat Not Completed: Patient declined, no reason specified. Pt being d/c'd soon and declining PT session at this time.   [name] 10/27/2017, 1:53 PM

## 2017-10-27 NOTE — Discharge Summary (Signed)
Physician Discharge Summary  [name] ZOX:[id number] DOB: [birth year] DOA: 10/25/2017  PCP: [name], [name], MD  Admit date: 10/25/2017 Discharge date: 10/27/2017   Recommendations for Outpatient Follow-Up:   1. B12 injections 2. Outpatient anemia follow up and further work up as needed   Discharge Diagnosis:   Principal Problem:   Chest pain Active Problems:   Essential hypertension   Hyperlipidemia   Morbid obesity with BMI of 50.0-59.9, adult (HCC)   Asthma   Hypertension   OSA on CPAP   Left ventricular diastolic dysfunction   Positive D dimer   Normochromic anemia   Low vitamin B12 level   Discharge disposition:  Home  Discharge Condition: Improved.  Diet recommendation: Low sodium, heart healthy  Wound care: None.   History of Present Illness:   62 yo F with supermorbid obesity, HTN, asthma, OSA, who was admitted to the hospital on 4/17 with chest pain. Cardiology was consulted.      Hospital Course by Problem:   Chest pain -cardiology consulted, symptoms atypical -echo reassuring with EF 65-70%, grade 1 DD -reproducible with palpation -cardiac enzymes negative, EKG non ischemic  Dysphagia -Augmentin x 3 days due to sore throat -much improved today  Weakness -seen by PT-- home PT recommended -seems improved on day of discharge  Super morbid obesity -counseled for weight loss Body mass index is 60.32 kg/m.  HTN -stable, continue Lisinopril / HCTZ  Elevated D dimer -no central PE seen on CT scan  Asthma -nebs  B12 deficiency -s/p IM B12 4/19. Needs weekly shots for a month then monthly      Medical Consultants:    None.   Discharge Exam:   Vitals:   10/27/17 1007 10/27/17 1125  BP: 129/62 120/89  Pulse: 71 68  Resp:    Temp:  98.4 F (36.9 C)  SpO2: 95%    Vitals:   10/27/17 0514 10/27/17 0837 10/27/17 1007 10/27/17 1125  BP: (!) 111/56  129/62 120/89  Pulse: 66  71 68  Resp: 18     Temp: 98.1 F  (36.7 C)   98.4 F (36.9 C)  TempSrc: Oral   Oral  SpO2: 96% 99% 95%   Weight: (!) 164.4 kg (362 lb 8 oz)     Height:        Gen:  NAD-- up walking throughout the night  The results of significant diagnostics from this hospitalization (including imaging, microbiology, ancillary and laboratory) are listed below for reference.     Procedures and Diagnostic Studies:   Dg Chest 2 View  Result Date: 10/25/2017 CLINICAL DATA:  Chest tightness EXAM: CHEST - 2 VIEW COMPARISON:  09/25/2016 FINDINGS: Borderline heart size. No acute airspace disease or pleural effusion. No pneumothorax. Aortic atherosclerosis. IMPRESSION: No active cardiopulmonary disease.  Borderline cardiomegaly. Electronically Signed   By: Jasmine Pang M.D.   On: 10/25/2017 01:21   Ct Angio Chest Pe W And/or Wo Contrast  Result Date: 10/25/2017 CLINICAL DATA:  Acute onset of generalized chest tightness and arm numbness. Elevated D-dimer. EXAM: CT ANGIOGRAPHY CHEST WITH CONTRAST TECHNIQUE: Multidetector CT imaging of the chest was performed using the standard protocol during bolus administration of intravenous contrast. Multiplanar CT image reconstructions and MIPs were obtained to evaluate the vascular anatomy. CONTRAST:  ISOVUE-370 IOPAMIDOL (ISOVUE-370) INJECTION 76% COMPARISON:  Chest radiograph performed earlier today at 1:05 a.m., and CTA of the chest performed 06/24/2014 FINDINGS: Cardiovascular: There is question of filling defect within the pulmonary artery branches to  the left lower lobe, though this may be artifactual in nature given limitations in the timing of the contrast bolus. No central pulmonary embolus is seen. The heart is normal in size. Calcification is noted at the mitral valve. Mild coronary artery calcification is noted. Calcification is noted at the aortic arch and proximal great vessels. Mediastinum/Nodes: No pericardial effusion is seen. No mediastinal lymphadenopathy is appreciated. The visualized  portions of the thyroid gland are unremarkable. No axillary lymphadenopathy is identified. Lungs/Pleura: A 7 mm nodule is again noted at the left lung base (image eighty-eight of 129). Smaller right-sided pulmonary nodules are again noted. These are relatively stable from 2015 and likely benign. No pleural effusion or pneumothorax is seen. No suspicious masses are identified. Upper Abdomen: The visualized portions of the liver and spleen are unremarkable. The visualized portions of the pancreas and gallbladder are within normal limits. Musculoskeletal: No acute osseous abnormalities are identified. The visualized musculature is unremarkable in appearance. Review of the MIP images confirms the above findings. IMPRESSION: 1. No central pulmonary embolus seen. Question of filling defect within the pulmonary artery branches to the left lower lobe, though this may be artifactual in nature given limitations in the timing of the contrast bolus. 2. Small bilateral pulmonary nodules are likely benign, given stability from 2015. 3. Mild coronary artery calcification. Calcification at the mitral valve. These results were called by telephone at the time of interpretation on 10/25/2017 at 5:24 am to Sharilyn SitesLisa Sanders PA, who verbally acknowledged these results. Electronically Signed   By: Roanna RaiderJeffery  Chang M.D.   On: 10/25/2017 05:27     Labs:   Basic Metabolic Panel: Recent Labs  Lab 10/25/17 0207 10/27/17 1010  NA 140 137  K 3.7 4.3  CL 111 108  CO2 21* 19*  GLUCOSE 112* 118*  BUN 14 9  CREATININE 0.82 0.72  CALCIUM 9.0 8.9   GFR Estimated Creatinine Clearance: 118.1 mL/min (by C-G formula based on SCr of 0.72 mg/dL). Liver Function Tests: No results for input(s): AST, ALT, ALKPHOS, BILITOT, PROT, ALBUMIN in the last 168 hours. No results for input(s): LIPASE, AMYLASE in the last 168 hours. No results for input(s): AMMONIA in the last 168 hours. Coagulation profile No results for input(s): INR, PROTIME in the  last 168 hours.  CBC: Recent Labs  Lab 10/25/17 0207  WBC 5.3  HGB 10.5*  HCT 34.0*  MCV 89.7  PLT 312   Cardiac Enzymes: Recent Labs  Lab 10/25/17 0859 10/25/17 1208 10/25/17 1915  TROPONINI <0.03 <0.03 <0.03   BNP: Invalid input(s): POCBNP CBG: No results for input(s): GLUCAP in the last 168 hours. D-Dimer Recent Labs    10/25/17 0256  DDIMER 0.82*   Hgb A1c Recent Labs    10/25/17 1208  HGBA1C 5.3   Lipid Profile Recent Labs    10/25/17 1208  CHOL 189  HDL 41  LDLCALC 138*  TRIG 50  CHOLHDL 4.6   Thyroid function studies Recent Labs    10/25/17 0859  TSH 3.440   Anemia work up Recent Labs    10/25/17 0859  VITAMINB12 89*  FOLATE 8.9  FERRITIN 15  TIBC 330  IRON 47  RETICCTPCT 1.5   Microbiology Recent Results (from the past 240 hour(s))  Group A Strep by PCR     Status: None   Collection Time: 10/26/17  1:17 PM  Result Value Ref Range Status   Group A Strep by PCR NOT DETECTED NOT DETECTED Final    Comment: Performed  at [facility], [address], [place], [place] [address]     Discharge Instructions:   Discharge Instructions    Diet - low sodium heart healthy   Complete by:  As directed    Discharge instructions   Complete by:  As directed    Will need to follow up with PCP for B12 replacement and further work up   Increase activity slowly   Complete by:  As directed      Allergies as of 10/27/2017      Reactions   Ketorolac Tromethamine Shortness Of Breath, Palpitations   Atrovent [ipratropium] Hives, Rash   Latex Other (See Comments)   Powdered. Confirm type of reaction with patient.      Medication List    STOP taking these medications   glycopyrrolate 2 MG tablet Commonly known as:  ROBINUL     TAKE these medications   acetaminophen 500 MG tablet Commonly known as:  TYLENOL Take 1 tablet (500 mg total) by mouth every 6 (six) hours as needed. What changed:  reasons to take this   albuterol 108 (90  Base) MCG/ACT inhaler Commonly known as:  PROVENTIL HFA;VENTOLIN HFA Inhale 1-2 puffs into the lungs every 6 (six) hours as needed for wheezing or shortness of breath.   amoxicillin-clavulanate 875-125 MG tablet Commonly known as:  AUGMENTIN Take 1 tablet by mouth every 12 (twelve) hours.   aspirin 81 MG EC tablet Take 1 tablet (81 mg total) by mouth daily. Start taking on:  10/28/2017   budesonide-formoterol 160-4.5 MCG/ACT inhaler Commonly known as:  SYMBICORT Inhale 2 puffs into the lungs 2 (two) times daily.   diclofenac sodium 1 % Gel Commonly known as:  VOLTAREN Apply 4 g topically 4 (four) times daily.   DULoxetine 30 MG capsule Commonly known as:  CYMBALTA Take 1 capsule (30 mg total) by mouth daily. What changed:    when to take this  reasons to take this   fluticasone 50 MCG/ACT nasal spray Commonly known as:  FLONASE Place 1 spray into both nostrils 2 (two) times daily. What changed:    when to take this  reasons to take this   furosemide 20 MG tablet Commonly known as:  LASIX Take 1 tablet (20 mg total) by mouth daily as needed. What changed:  reasons to take this   levocetirizine 5 MG tablet Commonly known as:  XYZAL Take 1 tablet (5 mg total) by mouth every evening. What changed:    when to take this  reasons to take this   lisinopril-hydrochlorothiazide 20-25 MG tablet Commonly known as:  PRINZIDE,ZESTORETIC Take 1 tablet by mouth daily.   montelukast 10 MG tablet Commonly known as:  SINGULAIR Take 1 tablet (10 mg total) by mouth at bedtime. What changed:    when to take this  reasons to take this   pantoprazole 40 MG tablet Commonly known as:  PROTONIX Take 1 tablet (40 mg total) by mouth daily. What changed:    when to take this  reasons to take this   tiZANidine 2 MG tablet Commonly known as:  ZANAFLEX Take 1 tablet (2 mg total) by mouth every 8 (eight) hours as needed for muscle spasms.   traMADol 50 MG tablet Commonly  known as:  ULTRAM Take 1-2 tablets (50-100 mg total) by mouth 3 (three) times daily as needed. What changed:  reasons to take this      Follow-up Information    [name], [name], MD Follow up  in 1 week(s).   Specialty:  Family Medicine Contact information: [address] [place] [address] [phone]            Time coordinating discharge: 35 min  Signed:  Joseph Art   [facility] 10/27/2017, 12:36 PM

## 2017-10-30 ENCOUNTER — Telehealth: Payer: Self-pay | Admitting: Family Medicine

## 2017-10-30 NOTE — Telephone Encounter (Signed)
Transition Care Management Follow-up Telephone Call   Per Discharge Summary Admit date: 10/25/2017 Discharge date: 10/27/2017   Recommendations for Outpatient Follow-Up:   2. B12 injections 3. Outpatient anemia follow up and further work up as needed     --   How have you been since you were released from the hospital? Pt doing well but complains of fatigue when walking.  Still having aches and pains along with spasms.   Do you understand why you were in the hospital? yes   Do you understand the discharge instructions? yes   Where were you discharged to? Home   Items Reviewed:  Medications reviewed: yes  Allergies reviewed: yes  Dietary changes reviewed: yes, no appetite.  Advised to eat light meals  Referrals reviewed: yes   Functional Questionnaire:   Activities of Daily Living (ADLs):   She states they are independent in the following: ambulation, bathing and hygiene, feeding, continence, grooming, toileting and dressing States they require assistance with the following: N/A   Any transportation issues/concerns?: no   Any patient concerns? no   Confirmed importance and date/time of follow-up visits scheduled yes  Provider Appointment booked with Dr. [place] on 11/07/17 @ 3:30 PM  Confirmed with patient if condition begins to worsen call PCP or go to the ER.  Patient was given the office number and encouraged to call back with question or concerns.  : yes

## 2017-11-02 ENCOUNTER — Encounter: Payer: Self-pay | Admitting: Internal Medicine

## 2017-11-06 ENCOUNTER — Encounter: Payer: Self-pay | Admitting: Internal Medicine

## 2017-11-06 ENCOUNTER — Ambulatory Visit (INDEPENDENT_AMBULATORY_CARE_PROVIDER_SITE_OTHER): Payer: Medicare Other | Admitting: Internal Medicine

## 2017-11-06 VITALS — BP 124/82 | HR 63 | Ht 65.0 in | Wt 354.2 lb

## 2017-11-06 DIAGNOSIS — G4733 Obstructive sleep apnea (adult) (pediatric): Secondary | ICD-10-CM

## 2017-11-06 DIAGNOSIS — J4520 Mild intermittent asthma, uncomplicated: Secondary | ICD-10-CM

## 2017-11-06 DIAGNOSIS — F5101 Primary insomnia: Secondary | ICD-10-CM | POA: Diagnosis not present

## 2017-11-06 DIAGNOSIS — Z9989 Dependence on other enabling machines and devices: Secondary | ICD-10-CM | POA: Diagnosis not present

## 2017-11-06 DIAGNOSIS — Z6841 Body Mass Index (BMI) 40.0 and over, adult: Secondary | ICD-10-CM | POA: Diagnosis not present

## 2017-11-06 DIAGNOSIS — G4700 Insomnia, unspecified: Secondary | ICD-10-CM | POA: Insufficient documentation

## 2017-11-06 MED ORDER — ZOLPIDEM TARTRATE 5 MG PO TABS: ORAL_TABLET | ORAL | 5 refills | Status: DC

## 2017-11-06 NOTE — Assessment & Plan Note (Signed)
Difficulty initiating and maintaining sleep which seems separate from her OSA.  Suspect part of the problem is sedentary lifestyle. Plan-emphasis on good sleep habits, regular exercise, basic health maintenance.  May try Ambien 5 mg with discussion done.

## 2017-11-06 NOTE — Patient Instructions (Addendum)
Script printed to try ambien for sleep if needed  Order- [facility]- please change CPAP auto range to 8-15, and please work with patient to refit/ change mask style as needed for fit and comfort. Continue mask of choice, humidifier, supplies  Please call as needed

## 2017-11-06 NOTE — Progress Notes (Signed)
HPI female former smoker followed for OSA, complicated by morbid obesity, allergic rhinitis, asthma, HBP, hyperlipidemia, diastolic dysfunction, anemia Unattended Home Sleep Test 01/19/17-AHI 8.9/hour, desaturation to 92%, body weight 339 pounds  -------------------------------------------------------------------------------------------- 07/07/17-62 year old female former smoker followed for OSA, complicated by morbid obesity, allergic rhinitis, asthma CPAP auto 5-20/[facility] ---follow up for CPAP. Uses [facility]. States that her mask has not fitting correctly since she has been sick.  Download 73% compliance, AHI 1.2/hour.  Pressure is averaging between 8.7-14.8.  She has not worn CPAP in the last few days because of bad cold with active cough which is aggravated her asthma.  Denies fever but says sputum is green.  Had been sleeping better with CPAP before she got sick.  11/06/2017- 62 year old female former smoker followed for OSA, complicated by morbid obesity, allergic rhinitis, asthma, HBP, hyperlipidemia, diastolic dysfunction, anemia CPAP auto 5-20/[facility] ----4 month follow up for OSA. Per patient, she has been feeling ok. She does have nights where she can not sleep even with the cpap machine.  Weight today 354 pounds She reports staying in "to avoid heat and pollen".  Has been having to go to her mother's home frequently at night to care for her, leaving CPAP.  We reviewed compliance issues.  Download 23% AHI 1.5/hour. She has awakened at times smothered/panicked.  Episodes are self-limited after she sits up but she does not put the mask back on when she lies down and has not been introspective about what triggered the sensation.  Apparently there is not sustained wheeze or cough.  ROS-see HPI    "+" = positive Constitutional:    weight loss, night sweats, fevers, chills, fatigue, lassitude. HEENT:    headaches, difficulty swallowing, tooth/dental problems, sore throat,       sneezing,  +itching, ear ache, nasal congestion, post nasal drip, snoring CV:    chest pain, orthopnea, PND, swelling in lower extremities, anasarca,                                                 dizziness, palpitations Resp:   +shortness of breath with exertion or at rest.                productive cough,   non-productive cough, coughing up of blood.              change in color of mucus.  + wheezing.   Skin:    rash or lesions. GI:  No-   heartburn, indigestion, abdominal pain, nausea, vomiting, diarrhea,                 change in bowel habits, loss of appetite GU: dysuria, change in color of urine, no urgency or frequency.   flank pain. MS:   +joint pain, stiffness, decreased range of motion, back pain. Neuro-     nothing unusual Psych:  change in mood or affect.  depression or anxiety.   memory loss.  OBJ- Physical Exam General- Alert, Oriented, Affect-appropriate, Distress- none acute, + Morbidly obese,  Skin- rash-none, lesions- none, excoriation- none Lymphadenopathy- none Head- atraumatic            Eyes- Gross vision intact, PERRLA, conjunctivae and secretions clear            Ears- Hearing, canals-normal            Nose- Clear, no-Septal dev,  mucus, polyps, erosion, perforation             Throat- Mallampati II-III , mucosa clear , drainage- none, tonsils- atrophic, + missing teeth Neck- flexible , trachea midline, no stridor , thyroid nl, carotid no bruit Chest - symmetrical excursion , unlabored           Heart/CV- RRR , no murmur heard , no gallop  , no rub, nl s1 s2                           - JVD- none , edema- none, stasis changes- none, varices- none           Lung-clear, wheeze-none, cough-none,  dullness-none, rub- none           Chest wall-  Abd-  Br/ Gen/ Rectal- Not done, not indicated Extrem- cyanosis- none, clubbing, none, atrophy- none, strength- nl Neuro- grossly intact to observation

## 2017-11-06 NOTE — Assessment & Plan Note (Signed)
She denies need for inhaler refills at this time.  We discussed maintenance and rescue.  Rescue was used only occasionally.

## 2017-11-06 NOTE — Assessment & Plan Note (Signed)
I reviewed her download with her, again educated compliance goals, discussed mask comfort. Plan-try narrowing pressure range to 8-15 at work on mask fit/comfort.

## 2017-11-06 NOTE — Assessment & Plan Note (Signed)
Effective weight loss could improve her quality of life and management of medical problems.  She can discuss bariatric referral with PCP

## 2017-11-07 ENCOUNTER — Encounter: Payer: Self-pay | Admitting: Family Medicine

## 2017-11-07 ENCOUNTER — Ambulatory Visit (INDEPENDENT_AMBULATORY_CARE_PROVIDER_SITE_OTHER): Payer: Medicare Other | Admitting: Family Medicine

## 2017-11-07 VITALS — BP 130/84 | HR 75 | Temp 98.6°F | Ht 65.0 in | Wt 353.2 lb

## 2017-11-07 DIAGNOSIS — E538 Deficiency of other specified B group vitamins: Secondary | ICD-10-CM | POA: Diagnosis not present

## 2017-11-07 DIAGNOSIS — R0789 Other chest pain: Secondary | ICD-10-CM | POA: Diagnosis not present

## 2017-11-07 DIAGNOSIS — I10 Essential (primary) hypertension: Secondary | ICD-10-CM | POA: Diagnosis not present

## 2017-11-07 DIAGNOSIS — Z6841 Body Mass Index (BMI) 40.0 and over, adult: Secondary | ICD-10-CM | POA: Diagnosis not present

## 2017-11-07 DIAGNOSIS — D649 Anemia, unspecified: Secondary | ICD-10-CM

## 2017-11-07 MED ORDER — CYANOCOBALAMIN 1000 MCG/ML IJ SOLN
1000.0000 ug | Freq: Once | INTRAMUSCULAR | Status: AC
  Administered 2017-11-07: 1000 ug via INTRAMUSCULAR

## 2017-11-07 NOTE — Patient Instructions (Signed)
A few things to remember from today's visit:   B12 deficiency  Essential hypertension  Morbid obesity with BMI of 50.0-59.9, adult (HCC)  Chest pain, atypical  No changes today. B12 injection weekly x 3 then every 4-5 weeks.   Please be sure medication list is accurate. If a new problem present, please set up appointment sooner than planned today.

## 2017-11-07 NOTE — Progress Notes (Signed)
HPI:   [name] is a 62 y.o. female, who is here today to follow on recent hospitalization.  Hospitalized from 10/25/17 to 10/27/17. TCM call 10/30/17.   She presented to the ER complaining of chest pain. Cardiology was consulted during hospitalization. Echo: LVEF 65 to 70%, grade 1 diastolic dysfunction.  Cardiac enzymes negative and EKG did not show ischemic changes.  Chest pain was reproducible upon palpation, felt to be musculoskeletal. She is reporting improvement of chest pain, states that she still having some "muscle spasm" on chest wall. She has not noted dyspnea, palpitations, or diaphoresis.  D-dimer positive. Chest CTA: Negative for PE.  Small bilateral pulmonary nodules, likely benign, stable from 2015.   Lab Results  Component Value Date   WBC 5.3 10/25/2017   HGB 10.5 (L) 10/25/2017   HCT 34.0 (L) 10/25/2017   MCV 89.7 10/25/2017   PLT 312 10/25/2017   Colonoscopy scheduled for 11/2016. She has not noted gross hematuria or blood in his stool. No changes in bowel habits.  B12 deficiency: She received a dose of B12 1000 units before discharge.  Lab Results  Component Value Date   VITAMINB12 89 (L) 10/25/2017    Lower extremities "soreness" has also improved.  She has not noted erythema or worsening edema.  Home PT was recommended, she does not feel like she needs it at this time.  She has been walking daily and feels like she is closer to her baseline, still having mild fatigue.  OSA: she follows with Dr. Maple Hudson yesterday, pulmonologist.  Ambien 5 mg was prescribed.  Obesity: She has been walking daily and has decreased portions, trying to follow a healthier diet. She has an appointment with Dr. Tonette Bihari to discuss obesity treatments.   Review of Systems  Constitutional: Positive for fatigue. Negative for activity change, appetite change and fever.  HENT: Negative for mouth sores, nosebleeds and trouble swallowing.   Eyes: Negative for  redness and visual disturbance.  Respiratory: Negative for cough, shortness of breath and wheezing.   Cardiovascular: Negative for chest pain, palpitations and leg swelling.  Gastrointestinal: Negative for abdominal pain, nausea and vomiting.       Negative for changes in bowel habits.  Genitourinary: Negative for decreased urine volume and hematuria.  Musculoskeletal: Positive for arthralgias and myalgias. Negative for gait problem.  Skin: Negative for rash and wound.  Allergic/Immunologic: Positive for environmental allergies.  Neurological: Negative for syncope, weakness and headaches.  Psychiatric/Behavioral: Negative for confusion.      Current Outpatient Medications on File Prior to Visit  Medication Sig Dispense Refill  . acetaminophen (TYLENOL) 500 MG tablet Take 1 tablet (500 mg total) by mouth every 6 (six) hours as needed. (Patient taking differently: Take 500 mg by mouth every 6 (six) hours as needed for mild pain. ) 120 tablet 1  . albuterol (PROVENTIL HFA;VENTOLIN HFA) 108 (90 Base) MCG/ACT inhaler Inhale 1-2 puffs into the lungs every 6 (six) hours as needed for wheezing or shortness of breath. 1 Inhaler 2  . budesonide-formoterol (SYMBICORT) 160-4.5 MCG/ACT inhaler Inhale 2 puffs into the lungs 2 (two) times daily. 3 Inhaler 1  . diclofenac (VOLTAREN) 75 MG EC tablet   1  . diclofenac sodium (VOLTAREN) 1 % GEL Apply 4 g topically 4 (four) times daily. 100 g 0  . DULoxetine (CYMBALTA) 30 MG capsule Take 1 capsule (30 mg total) by mouth daily. (Patient taking differently: Take 30 mg by mouth daily as needed (pain). )  30 capsule 1  . fluticasone (FLONASE) 50 MCG/ACT nasal spray Place 1 spray into both nostrils 2 (two) times daily. (Patient taking differently: Place 1 spray into both nostrils 2 (two) times daily as needed for allergies. ) 16 g 3  . levocetirizine (XYZAL) 5 MG tablet Take 1 tablet (5 mg total) by mouth every evening. (Patient taking differently: Take 5 mg by mouth  daily as needed for allergies. ) 90 tablet 1  . lisinopril-hydrochlorothiazide (PRINZIDE,ZESTORETIC) 20-25 MG tablet Take 1 tablet by mouth daily. 90 tablet 1  . montelukast (SINGULAIR) 10 MG tablet Take 1 tablet (10 mg total) by mouth at bedtime. (Patient taking differently: Take 10 mg by mouth daily as needed (Asthma). ) 90 tablet 1  . pantoprazole (PROTONIX) 40 MG tablet Take 1 tablet (40 mg total) by mouth daily. 30 tablet 3  . tiZANidine (ZANAFLEX) 2 MG tablet Take 1 tablet (2 mg total) by mouth every 8 (eight) hours as needed for muscle spasms. 45 tablet 1  . traMADol (ULTRAM) 50 MG tablet Take 1-2 tablets (50-100 mg total) by mouth 3 (three) times daily as needed. (Patient taking differently: Take 50-100 mg by mouth 3 (three) times daily as needed for moderate pain. ) 60 tablet 0  . zolpidem (AMBIEN) 5 MG tablet 1 at bedtime if needed for sleep 30 tablet 5   No current facility-administered medications on file prior to visit.      Past Medical History:  Diagnosis Date  . Abnormal EKG 06/2003   History of inverted T waves V1-V3. Normal 2D echo (07/23/2003): LVEF 65%.  . Anemia    BL Hgb 11-12. Ferritin 14 - low normal (08/2007). Colonoscopy 2009 - external hemorrhoids (excellent prep). Last anemia panel (12/2010) - Iron  24, TIBC 269,  B12  316, Folate 11.9, Ferritin 73.  . Asthma   . Degenerative joint disease    BL knees (L>R), lumbar spine. Followed by Sports Medicine, Dr. Jennette Kettle.  . History of multiple pulmonary nodules    Incidental finding: CT Abd/ Pelvis (04/2010) - Several small lower lobe lung nodules, including one pure ground-glass pulmonary nodule measuring 8 mm in the left lower lobe.  Recommend follow-up chest CT (IV contrast preferred) in 6 months to document stability. //  CT Abd/ Pelvis (07/2010) -  3 mm RLL and  8 mm LLL nodule stable.  Other nodules unchanged, likely benign.  . Hyperlipidemia   . Hypertension   . Incarcerated ventral hernia 04/2010   Noted on CT Abd/  pelvis (04/2010). Patient now s/p ventral hernia repair by Dr. Gerrit Friends (12/2010)  . 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 Lower extremity edema    Chronic. 2D echo (2005) - EF 65%.  . Obesity    Allergies  Allergen Reactions  . Ketorolac Tromethamine Shortness Of Breath and Palpitations  . Atrovent [Ipratropium] Hives and Rash  . Latex Other (See Comments)    Powdered. Confirm type of reaction with patient.    Social History   Socioeconomic History  . Marital status: Married    Spouse name: Not on file  . Number of children: 3  . Years of education: Not on file  . Highest education level: Not on file  Occupational History  . Not on file  Social Needs  . Financial resource strain: Not on file  . Food insecurity:    Worry: Not on file    Inability: Not on file  . Transportation needs:  Medical: Not on file    Non-medical: Not on file  Tobacco Use  . Smoking status: Former Smoker    Packs/day: 0.50    Years: 20.00    Pack years: 10.00    Types: Cigarettes    Last attempt to quit: 09/10/1998    Years since quitting: 19.1  . Smokeless tobacco: Never Used  . Tobacco comment: 62 yo   Substance and Sexual Activity  . Alcohol use: No    Alcohol/week: 0.0 oz  . Drug use: No  . Sexual activity: Yes    Partners: Male  Lifestyle  . Physical activity:    Days per week: Not on file    Minutes per session: Not on file  . Stress: Not on file  Relationships  . Social connections:    Talks on phone: Not on file    Gets together: Not on file    Attends religious service: Not on file    Active member of club or organization: Not on file    Attends meetings of clubs or organizations: Not on file    Relationship status: Not on file  Other Topics Concern  . Not on file  Social History Narrative  . Not on file    Vitals:   11/07/17 1525  BP: 130/84  Pulse: 75  Temp: 98.6 F (37 C)  SpO2: 99%   Body mass index is 58.78 kg/m.  Wt  Readings from Last 3 Encounters:  11/07/17 (!) 353 lb 4 oz (160.2 kg)  11/06/17 (!) 354 lb 3.2 oz (160.7 kg)  10/27/17 (!) 362 lb 8 oz (164.4 kg)    Physical Exam  Nursing note and vitals reviewed. Constitutional: She is oriented to person, place, and time. She appears well-developed. No distress.  HENT:  Head: Normocephalic and atraumatic.  Mouth/Throat: Oropharynx is clear and moist and mucous membranes are normal.  Eyes: Pupils are equal, round, and reactive to light. Conjunctivae are normal.  Cardiovascular: Normal rate and regular rhythm.  No murmur heard. Pulses:      Dorsalis pedis pulses are 2+ on the right side, and 2+ on the left side.  Respiratory: Effort normal and breath sounds normal. No respiratory distress.  GI: Soft. She exhibits no mass. There is no tenderness.  Musculoskeletal: She exhibits no edema.  Lymphadenopathy:    She has no cervical adenopathy.  Neurological: She is alert and oriented to person, place, and time. She has normal strength. Coordination normal.  Skin: Skin is warm. No rash noted. No erythema.  Psychiatric: She has a normal mood and affect.  Well groomed, good eye contact.    ASSESSMENT AND PLAN:  [name] was seen today for hospitalization follow-up.  Orders Placed This Encounter  Procedures  . CBC with Differential/Platelet   Lab Results  Component Value Date   WBC 5.3 11/07/2017   HGB 11.8 (L) 11/07/2017   HCT 36.4 11/07/2017   MCV 87.7 11/07/2017   PLT 363.0 11/07/2017     B12 deficiency  B12 1000 mcg IM given today and will repeat weekly for 2 more to complete weekly x 4,then monthly.  -     cyanocobalamin ((VITAMIN B-12)) injection 1,000 mcg -     CBC with Differential/Platelet  Essential hypertension  BP adequately controlled today. No changes in current management. Continue low salt diet. F/U in 4-5 months.  Morbid obesity with BMI of 50.0-59.9, adult (HCC)  Since her last OV, 09/2017,she has lost about 10  Lb (363 Lb 2  Oz). We discussed benefits of wt loss as well as adverse effects of obesity. Consistency with healthy diet and physical activity recommended. She has an appt with Dr Malena Edman on 11/13/17.  Chest pain, atypical  Negative work up. Reporting improvement. She will monitor for warning signs.  Anemia, unspecified type  Further recommendations will be given according to CBC results.  She has appt with GI for colonoscopy in a couple weeks.    Emilea Goga G. [name], MD  [facility]. [place] office.

## 2017-11-08 ENCOUNTER — Encounter: Payer: Self-pay | Admitting: Family Medicine

## 2017-11-08 LAB — CBC WITH DIFFERENTIAL/PLATELET
Basophils Absolute: 0.1 10*3/uL (ref 0.0–0.1)
Basophils Relative: 1.1 % (ref 0.0–3.0)
Eosinophils Absolute: 0.2 10*3/uL (ref 0.0–0.7)
Eosinophils Relative: 4.6 % (ref 0.0–5.0)
HCT: 36.4 % (ref 36.0–46.0)
Hemoglobin: 11.8 g/dL — ABNORMAL LOW (ref 12.0–15.0)
Lymphocytes Relative: 40.3 % (ref 12.0–46.0)
Lymphs Abs: 2.1 10*3/uL (ref 0.7–4.0)
MCHC: 32.4 g/dL (ref 30.0–36.0)
MCV: 87.7 fl (ref 78.0–100.0)
Monocytes Absolute: 0.4 10*3/uL (ref 0.1–1.0)
Monocytes Relative: 7.7 % (ref 3.0–12.0)
Neutro Abs: 2.5 10*3/uL (ref 1.4–7.7)
Neutrophils Relative %: 46.3 % (ref 43.0–77.0)
Platelets: 363 10*3/uL (ref 150.0–400.0)
RBC: 4.16 Mil/uL (ref 3.87–5.11)
RDW: 14.9 % (ref 11.5–15.5)
WBC: 5.3 10*3/uL (ref 4.0–10.5)

## 2017-11-09 ENCOUNTER — Encounter (INDEPENDENT_AMBULATORY_CARE_PROVIDER_SITE_OTHER): Payer: Medicare Other

## 2017-11-10 ENCOUNTER — Encounter: Payer: Self-pay | Admitting: Family Medicine

## 2017-11-10 DIAGNOSIS — G4733 Obstructive sleep apnea (adult) (pediatric): Secondary | ICD-10-CM | POA: Diagnosis not present

## 2017-11-13 ENCOUNTER — Encounter (INDEPENDENT_AMBULATORY_CARE_PROVIDER_SITE_OTHER): Payer: Self-pay

## 2017-11-13 ENCOUNTER — Ambulatory Visit (INDEPENDENT_AMBULATORY_CARE_PROVIDER_SITE_OTHER): Payer: Self-pay | Admitting: Family Medicine

## 2017-11-14 ENCOUNTER — Ambulatory Visit (INDEPENDENT_AMBULATORY_CARE_PROVIDER_SITE_OTHER): Payer: Medicare Other | Admitting: *Deleted

## 2017-11-14 ENCOUNTER — Ambulatory Visit: Payer: Medicare Other

## 2017-11-14 DIAGNOSIS — E538 Deficiency of other specified B group vitamins: Secondary | ICD-10-CM | POA: Diagnosis not present

## 2017-11-14 MED ORDER — CYANOCOBALAMIN 1000 MCG/ML IJ SOLN
1000.0000 ug | Freq: Once | INTRAMUSCULAR | Status: AC
  Administered 2017-11-14: 1000 ug via INTRAMUSCULAR

## 2017-11-14 NOTE — Progress Notes (Signed)
Per orders of Dr. Jordan, injection of B12 given by Elica Almas J Geisha Abernathy. Patient tolerated injection well. 

## 2017-11-20 ENCOUNTER — Encounter: Payer: Medicare Other | Admitting: Internal Medicine

## 2017-11-21 ENCOUNTER — Ambulatory Visit (INDEPENDENT_AMBULATORY_CARE_PROVIDER_SITE_OTHER): Payer: Self-pay | Admitting: Family Medicine

## 2017-11-27 ENCOUNTER — Ambulatory Visit (INDEPENDENT_AMBULATORY_CARE_PROVIDER_SITE_OTHER): Payer: Medicare Other | Admitting: Internal Medicine

## 2017-11-27 ENCOUNTER — Encounter: Payer: Self-pay | Admitting: Internal Medicine

## 2017-11-27 VITALS — BP 130/80 | HR 74 | Ht 65.0 in | Wt 361.4 lb

## 2017-11-27 DIAGNOSIS — R151 Fecal smearing: Secondary | ICD-10-CM | POA: Diagnosis not present

## 2017-11-27 DIAGNOSIS — Z1211 Encounter for screening for malignant neoplasm of colon: Secondary | ICD-10-CM

## 2017-11-27 NOTE — H&P (View-Only) (Signed)
[name] 62 y.o. [birth year] [id number]  Assessment & Plan:   Encounter Diagnoses  Name Primary?  . Fecal smearing Yes  . Special screening for malignant neoplasms, colon   . Morbid obesity (HCC)     She needs a screening colonoscopy.  She could have hemorrhoids or some other issue leading to the fecal smearing.  Nothing obvious on rectal exam today.  She had normal anorectal function as far as I could tell.  The risks and benefits as well as alternatives of endoscopic procedure(s) have been discussed and reviewed. All questions answered. The patient agrees to proceed.   I have encouraged her to investigate bariatric surgery which I think is in her best interest.  We gave her the number so she could attend a seminar.  I appreciate the opportunity to care for this patient. CC: [name], [name], MD     Subjective:   Chief Complaint: Fecal leakage time for screening colonoscopy HPI The patient is a 62 year old African-American woman with morbid obesity who has problems with fecal smearing, she describes finding small amounts of feces in the gluteal crease after wiping for urination.  Does not have frank full incontinence.  She has loose to soft stools 3-4 times a day which I think is stable.  She had a screening colonoscopy in 2009 that was negative.  She was seen in February and April 2018 for some diarrhea and abdominal pain symptoms and some haziness in the mesentery on a CT scan.  I thought she probably had an infectious and then postinfectious diarrhea.  She said those problems have all resolved.  She is aware she is obese, and she has a lot of joint and leg complaints and uses a cane.  She is interested in weight loss but has not really followed through.  She was due to see the medical bariatric practice but had to rearrange and has not done so. Allergies  Allergen Reactions  . Ketorolac Tromethamine Shortness Of Breath and Palpitations  . Atrovent [Ipratropium] Hives  and Rash  . Latex Other (See Comments)    Powdered. Confirm type of reaction with patient.   Current Meds  Medication Sig  . acetaminophen (TYLENOL) 500 MG tablet Take 1 tablet (500 mg total) by mouth every 6 (six) hours as needed. (Patient taking differently: Take 500 mg by mouth every 6 (six) hours as needed for mild pain. )  . albuterol (PROVENTIL HFA;VENTOLIN HFA) 108 (90 Base) MCG/ACT inhaler Inhale 1-2 puffs into the lungs every 6 (six) hours as needed for wheezing or shortness of breath.  . budesonide-formoterol (SYMBICORT) 160-4.5 MCG/ACT inhaler Inhale 2 puffs into the lungs 2 (two) times daily.  . diclofenac (VOLTAREN) 75 MG EC tablet   . diclofenac sodium (VOLTAREN) 1 % GEL Apply 4 g topically 4 (four) times daily.  . DULoxetine (CYMBALTA) 30 MG capsule Take 1 capsule (30 mg total) by mouth daily. (Patient taking differently: Take 30 mg by mouth daily as needed (pain). )  . fluticasone (FLONASE) 50 MCG/ACT nasal spray Place 1 spray into both nostrils 2 (two) times daily. (Patient taking differently: Place 1 spray into both nostrils 2 (two) times daily as needed for allergies. )  . levocetirizine (XYZAL) 5 MG tablet Take 1 tablet (5 mg total) by mouth every evening. (Patient taking differently: Take 5 mg by mouth daily as needed for allergies. )  . lisinopril-hydrochlorothiazide (PRINZIDE,ZESTORETIC) 20-25 MG tablet Take 1 tablet by mouth daily.  . montelukast (SINGULAIR) 10 MG tablet  Take 1 tablet (10 mg total) by mouth at bedtime. (Patient taking differently: Take 10 mg by mouth daily as needed (Asthma). )  . pantoprazole (PROTONIX) 40 MG tablet Take 1 tablet (40 mg total) by mouth daily. (Patient taking differently: Take 40 mg by mouth daily as needed. )  . tiZANidine (ZANAFLEX) 2 MG tablet Take 1 tablet (2 mg total) by mouth every 8 (eight) hours as needed for muscle spasms.  . traMADol (ULTRAM) 50 MG tablet Take 1-2 tablets (50-100 mg total) by mouth 3 (three) times daily as needed.  (Patient taking differently: Take 50-100 mg by mouth 3 (three) times daily as needed for moderate pain. )  . zolpidem (AMBIEN) 5 MG tablet 1 at bedtime if needed for sleep   Past Medical History:  Diagnosis Date  . Abnormal EKG 06/2003   History of inverted T waves V1-V3. Normal 2D echo (07/23/2003): LVEF 65%.  . Anemia    BL Hgb 11-12. Ferritin 14 - low normal (08/2007). Colonoscopy 2009 - external hemorrhoids (excellent prep). Last anemia panel (12/2010) - Iron  24, TIBC 269,  B12  316, Folate 11.9, Ferritin 73.  . Asthma   . Degenerative joint disease    BL knees (L>R), lumbar spine. Followed by Sports Medicine, Dr. Jennette Kettle.  . History of multiple pulmonary nodules    Incidental finding: CT Abd/ Pelvis (04/2010) - Several small lower lobe lung nodules, including one pure ground-glass pulmonary nodule measuring 8 mm in the left lower lobe.  Recommend follow-up chest CT (IV contrast preferred) in 6 months to document stability. //  CT Abd/ Pelvis (07/2010) -  3 mm RLL and  8 mm LLL nodule stable.  Other nodules unchanged, likely benign.  . Hyperlipidemia   . Hypertension   . Incarcerated ventral hernia 04/2010   Noted on CT Abd/ pelvis (04/2010). Patient now s/p ventral hernia repair by Dr. Gerrit Friends (12/2010)  . 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 Lower extremity edema    Chronic. 2D echo (2005) - EF 65%.  . Obesity    Past Surgical History:  Procedure Laterality Date  . COLONOSCOPY  2009  . FOOT SURGERY  2004    left  . INCISION AND DRAINAGE ABSCESS ANAL  07/2008   I&D and debridgement of anorectal abscess  . INCISIONAL HERNIA REPAIR  12/2010   Repair of incarcerated ventral incisional hernia with Ethicon mesh patch - performed by Dr. Gerrit Friends.   . INGUINAL HERNIA REPAIR    . JOINT REPLACEMENT Left 2013   left knee  . TOTAL KNEE ARTHROPLASTY  06/16/2011   Left TOTAL KNEE ARTHROPLASTY;  Surgeon: Kennieth Rad;  Location: [facility] OR;  Service: Orthopedics;   Laterality: Left;  left total knee arthroplasty  . TUBAL LIGATION     Social History   Social History Narrative   Patient is married and has 3 children   No alcohol tobacco or drug use reported   family history includes Dementia in her father; Diabetes in her brother and mother; Heart disease in her father; Hypertension in her brother, mother, and sister; Rashes / Skin problems in her maternal grandfather; Stroke in her mother.   Review of Systems See HPI.  Objective:   Physical Exam BP 130/80   Pulse 74   Ht  (1.651 m)   Wt (!) 361 lb 6 oz (163.9 kg)   LMP 09/09/2009   BMI 60.14 kg/m  Morbidly obese African-American woman no acute distress,  most of the weight is carried in hip/buttock and thighs. Eyes anicteric  lungs clear Heart S1-S2 no rubs murmurs or gallops Abdomen is obese  Rectal exam with female staff present, Morrie Sheldon CMA  Normal anoderm, normal resting tone no mass soft stool brown, no significant rectocele the exam is nontender appropriate voluntary squeeze, appropriate simulated defecation with abdominal contraction and descent and relaxation

## 2017-11-27 NOTE — Progress Notes (Signed)
 [name] 62 y.o. 09/04/1955 [id number]  Assessment & Plan:   Encounter Diagnoses  Name Primary?  . Fecal smearing Yes  . Special screening for malignant neoplasms, colon   . Morbid obesity (HCC)     She needs a screening colonoscopy.  She could have hemorrhoids or some other issue leading to the fecal smearing.  Nothing obvious on rectal exam today.  She had normal anorectal function as far as I could tell.  The risks and benefits as well as alternatives of endoscopic procedure(s) have been discussed and reviewed. All questions answered. The patient agrees to proceed.   I have encouraged her to investigate bariatric surgery which I think is in her best interest.  We gave her the number so she could attend a seminar.  I appreciate the opportunity to care for this patient. CC: Jordan, [name], MD     Subjective:   Chief Complaint: Fecal leakage time for screening colonoscopy HPI The patient is a 62-year-old African-American woman with morbid obesity who has problems with fecal smearing, she describes finding small amounts of feces in the gluteal crease after wiping for urination.  Does not have frank full incontinence.  She has loose to soft stools 3-4 times a day which I think is stable.  She had a screening colonoscopy in 2009 that was negative.  She was seen in February and April 2018 for some diarrhea and abdominal pain symptoms and some haziness in the mesentery on a CT scan.  I thought she probably had an infectious and then postinfectious diarrhea.  She said those problems have all resolved.  She is aware she is obese, and she has a lot of joint and leg complaints and uses a cane.  She is interested in weight loss but has not really followed through.  She was due to see the medical bariatric practice but had to rearrange and has not done so. Allergies  Allergen Reactions  . Ketorolac Tromethamine Shortness Of Breath and Palpitations  . Atrovent [Ipratropium] Hives  and Rash  . Latex Other (See Comments)    Powdered. Confirm type of reaction with patient.   Current Meds  Medication Sig  . acetaminophen (TYLENOL) 500 MG tablet Take 1 tablet (500 mg total) by mouth every 6 (six) hours as needed. (Patient taking differently: Take 500 mg by mouth every 6 (six) hours as needed for mild pain. )  . albuterol (PROVENTIL HFA;VENTOLIN HFA) 108 (90 Base) MCG/ACT inhaler Inhale 1-2 puffs into the lungs every 6 (six) hours as needed for wheezing or shortness of breath.  . budesonide-formoterol (SYMBICORT) 160-4.5 MCG/ACT inhaler Inhale 2 puffs into the lungs 2 (two) times daily.  . diclofenac (VOLTAREN) 75 MG EC tablet   . diclofenac sodium (VOLTAREN) 1 % GEL Apply 4 g topically 4 (four) times daily.  . DULoxetine (CYMBALTA) 30 MG capsule Take 1 capsule (30 mg total) by mouth daily. (Patient taking differently: Take 30 mg by mouth daily as needed (pain). )  . fluticasone (FLONASE) 50 MCG/ACT nasal spray Place 1 spray into both nostrils 2 (two) times daily. (Patient taking differently: Place 1 spray into both nostrils 2 (two) times daily as needed for allergies. )  . levocetirizine (XYZAL) 5 MG tablet Take 1 tablet (5 mg total) by mouth every evening. (Patient taking differently: Take 5 mg by mouth daily as needed for allergies. )  . lisinopril-hydrochlorothiazide (PRINZIDE,ZESTORETIC) 20-25 MG tablet Take 1 tablet by mouth daily.  . montelukast (SINGULAIR) 10 MG tablet   Take 1 tablet (10 mg total) by mouth at bedtime. (Patient taking differently: Take 10 mg by mouth daily as needed (Asthma). )  . pantoprazole (PROTONIX) 40 MG tablet Take 1 tablet (40 mg total) by mouth daily. (Patient taking differently: Take 40 mg by mouth daily as needed. )  . tiZANidine (ZANAFLEX) 2 MG tablet Take 1 tablet (2 mg total) by mouth every 8 (eight) hours as needed for muscle spasms.  . traMADol (ULTRAM) 50 MG tablet Take 1-2 tablets (50-100 mg total) by mouth 3 (three) times daily as needed.  (Patient taking differently: Take 50-100 mg by mouth 3 (three) times daily as needed for moderate pain. )  . zolpidem (AMBIEN) 5 MG tablet 1 at bedtime if needed for sleep   Past Medical History:  Diagnosis Date  . Abnormal EKG 06/2003   History of inverted T waves V1-V3. Normal 2D echo (07/23/2003): LVEF 65%.  . Anemia    BL Hgb 11-12. Ferritin 14 - low normal (08/2007). Colonoscopy 2009 - external hemorrhoids (excellent prep). Last anemia panel (12/2010) - Iron  24, TIBC 269,  B12  316, Folate 11.9, Ferritin 73.  . Asthma   . Degenerative joint disease    BL knees (L>R), lumbar spine. Followed by Sports Medicine, Dr. Neal.  . History of multiple pulmonary nodules    Incidental finding: CT Abd/ Pelvis (04/2010) - Several small lower lobe lung nodules, including one pure ground-glass pulmonary nodule measuring 8 mm in the left lower lobe.  Recommend follow-up chest CT (IV contrast preferred) in 6 months to document stability. //  CT Abd/ Pelvis (07/2010) -  3 mm RLL and  8 mm LLL nodule stable.  Other nodules unchanged, likely benign.  . Hyperlipidemia   . Hypertension   . Incarcerated ventral hernia 04/2010   Noted on CT Abd/ pelvis (04/2010). Patient now s/p ventral hernia repair by Dr. Gerkin (12/2010)  . 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 Lower extremity edema    Chronic. 2D echo (2005) - EF 65%.  . Obesity    Past Surgical History:  Procedure Laterality Date  . COLONOSCOPY  2009  . FOOT SURGERY  2004    left  . INCISION AND DRAINAGE ABSCESS ANAL  07/2008   I&D and debridgement of anorectal abscess  . INCISIONAL HERNIA REPAIR  12/2010   Repair of incarcerated ventral incisional hernia with Ethicon mesh patch - performed by Dr. Gerkin.   . INGUINAL HERNIA REPAIR    . JOINT REPLACEMENT Left 2013   left knee  . TOTAL KNEE ARTHROPLASTY  06/16/2011   Left TOTAL KNEE ARTHROPLASTY;  Surgeon: Arthur F Carter;  Location: [facility] OR;  Service: Orthopedics;   Laterality: Left;  left total knee arthroplasty  . TUBAL LIGATION     Social History   Social History Narrative   Patient is married and has 3 children   No alcohol tobacco or drug use reported   family history includes Dementia in her father; Diabetes in her brother and mother; Heart disease in her father; Hypertension in her brother, mother, and sister; Rashes / Skin problems in her maternal grandfather; Stroke in her mother.   Review of Systems See HPI.  Objective:   Physical Exam BP 130/80   Pulse 74   Ht 5' 5" (1.651 m)   Wt (!) 361 lb 6 oz (163.9 kg)   LMP 09/09/2009   BMI 60.14 kg/m  Morbidly obese African-American woman no acute distress,   most of the weight is carried in hip/buttock and thighs. Eyes anicteric  lungs clear Heart S1-S2 no rubs murmurs or gallops Abdomen is obese  Rectal exam with female staff present, Ashley CMA  Normal anoderm, normal resting tone no mass soft stool brown, no significant rectocele the exam is nontender appropriate voluntary squeeze, appropriate simulated defecation with abdominal contraction and descent and relaxation    

## 2017-11-27 NOTE — Patient Instructions (Signed)
  Call [facility] at [phone] to inquire about their weight loss seminars.   You have been scheduled for a colonoscopy. Please follow written instructions given to you at your visit today.  Please pick up your prep supplies at the pharmacy. If you use inhalers (even only as needed), please bring them with you on the day of your procedure.   I appreciate the opportunity to care for you. Stan Head, MD, [facility]

## 2017-12-11 DIAGNOSIS — G4733 Obstructive sleep apnea (adult) (pediatric): Secondary | ICD-10-CM | POA: Diagnosis not present

## 2017-12-15 ENCOUNTER — Other Ambulatory Visit: Payer: Self-pay

## 2017-12-15 ENCOUNTER — Encounter (HOSPITAL_COMMUNITY): Payer: Self-pay | Admitting: Emergency Medicine

## 2017-12-18 ENCOUNTER — Encounter (HOSPITAL_COMMUNITY)
Admission: RE | Disposition: A | Discharge status: Home / Self Care | Payer: Self-pay | Source: Ambulatory Visit | Attending: Internal Medicine

## 2017-12-18 ENCOUNTER — Ambulatory Visit (HOSPITAL_COMMUNITY)
Admission: RE | Admit: 2017-12-18 | Discharge: 2017-12-18 | Disposition: A | Discharge status: Home / Self Care | Payer: Medicare Other | Source: Ambulatory Visit | Attending: Internal Medicine | Admitting: Internal Medicine

## 2017-12-18 ENCOUNTER — Ambulatory Visit (HOSPITAL_COMMUNITY): Payer: Medicare Other | Admitting: Anesthesiology

## 2017-12-18 ENCOUNTER — Encounter (HOSPITAL_COMMUNITY): Payer: Self-pay | Admitting: *Deleted

## 2017-12-18 ENCOUNTER — Other Ambulatory Visit: Payer: Self-pay

## 2017-12-18 DIAGNOSIS — Z8249 Family history of ischemic heart disease and other diseases of the circulatory system: Secondary | ICD-10-CM | POA: Insufficient documentation

## 2017-12-18 DIAGNOSIS — Z1211 Encounter for screening for malignant neoplasm of colon: Secondary | ICD-10-CM | POA: Diagnosis not present

## 2017-12-18 DIAGNOSIS — Z8489 Family history of other specified conditions: Secondary | ICD-10-CM | POA: Diagnosis not present

## 2017-12-18 DIAGNOSIS — R1030 Lower abdominal pain, unspecified: Secondary | ICD-10-CM

## 2017-12-18 DIAGNOSIS — Z91040 Latex allergy status: Secondary | ICD-10-CM | POA: Insufficient documentation

## 2017-12-18 DIAGNOSIS — G4730 Sleep apnea, unspecified: Secondary | ICD-10-CM | POA: Diagnosis not present

## 2017-12-18 DIAGNOSIS — M170 Bilateral primary osteoarthritis of knee: Secondary | ICD-10-CM | POA: Diagnosis not present

## 2017-12-18 DIAGNOSIS — Z6841 Body Mass Index (BMI) 40.0 and over, adult: Secondary | ICD-10-CM | POA: Insufficient documentation

## 2017-12-18 DIAGNOSIS — I10 Essential (primary) hypertension: Secondary | ICD-10-CM | POA: Diagnosis not present

## 2017-12-18 DIAGNOSIS — Z87891 Personal history of nicotine dependence: Secondary | ICD-10-CM | POA: Diagnosis not present

## 2017-12-18 DIAGNOSIS — Z79899 Other long term (current) drug therapy: Secondary | ICD-10-CM | POA: Diagnosis not present

## 2017-12-18 DIAGNOSIS — Z823 Family history of stroke: Secondary | ICD-10-CM | POA: Insufficient documentation

## 2017-12-18 DIAGNOSIS — Z833 Family history of diabetes mellitus: Secondary | ICD-10-CM | POA: Diagnosis not present

## 2017-12-18 DIAGNOSIS — M1990 Unspecified osteoarthritis, unspecified site: Secondary | ICD-10-CM | POA: Diagnosis not present

## 2017-12-18 DIAGNOSIS — E785 Hyperlipidemia, unspecified: Secondary | ICD-10-CM | POA: Diagnosis not present

## 2017-12-18 DIAGNOSIS — Z7951 Long term (current) use of inhaled steroids: Secondary | ICD-10-CM | POA: Diagnosis not present

## 2017-12-18 DIAGNOSIS — Z820 Family history of epilepsy and other diseases of the nervous system: Secondary | ICD-10-CM | POA: Diagnosis not present

## 2017-12-18 DIAGNOSIS — E669 Obesity, unspecified: Secondary | ICD-10-CM | POA: Diagnosis not present

## 2017-12-18 DIAGNOSIS — J45909 Unspecified asthma, uncomplicated: Secondary | ICD-10-CM | POA: Diagnosis not present

## 2017-12-18 DIAGNOSIS — Z888 Allergy status to other drugs, medicaments and biological substances status: Secondary | ICD-10-CM | POA: Insufficient documentation

## 2017-12-18 DIAGNOSIS — Z96652 Presence of left artificial knee joint: Secondary | ICD-10-CM | POA: Insufficient documentation

## 2017-12-18 HISTORY — PX: COLONOSCOPY WITH PROPOFOL: SHX5780

## 2017-12-18 SURGERY — COLONOSCOPY WITH PROPOFOL
Anesthesia: Monitor Anesthesia Care

## 2017-12-18 MED ORDER — PSYLLIUM 28.3 % PO POWD: ORAL | Status: DC

## 2017-12-18 MED ORDER — PANTOPRAZOLE SODIUM 40 MG PO TBEC: 40.0000 mg | DELAYED_RELEASE_TABLET | Freq: Every day | ORAL | Status: DC | PRN

## 2017-12-18 MED ORDER — LEVOCETIRIZINE DIHYDROCHLORIDE 5 MG PO TABS: 5.0000 mg | ORAL_TABLET | Freq: Every day | ORAL | Status: DC | PRN

## 2017-12-18 MED ORDER — PROPOFOL 10 MG/ML IV BOLUS
INTRAVENOUS | Status: DC | PRN
  Administered 2017-12-18: 30 mg via INTRAVENOUS
  Administered 2017-12-18: 20 mg via INTRAVENOUS

## 2017-12-18 MED ORDER — LACTATED RINGERS IV SOLN
INTRAVENOUS | Status: DC
  Administered 2017-12-18: 10:00:00 via INTRAVENOUS

## 2017-12-18 MED ORDER — LIDOCAINE 2% (20 MG/ML) 5 ML SYRINGE
INTRAMUSCULAR | Status: DC | PRN
  Administered 2017-12-18: 100 mg via INTRAVENOUS

## 2017-12-18 MED ORDER — PROPOFOL 10 MG/ML IV BOLUS
INTRAVENOUS | Status: AC
  Filled 2017-12-18: qty 40

## 2017-12-18 MED ORDER — SODIUM CHLORIDE 0.9 % IV SOLN: INTRAVENOUS | Status: DC

## 2017-12-18 MED ORDER — PROPOFOL 500 MG/50ML IV EMUL
INTRAVENOUS | Status: DC | PRN
  Administered 2017-12-18: 125 ug/kg/min via INTRAVENOUS

## 2017-12-18 MED ORDER — PROPOFOL 10 MG/ML IV BOLUS
INTRAVENOUS | Status: AC
  Filled 2017-12-18: qty 20

## 2017-12-18 SURGICAL SUPPLY — 22 items

## 2017-12-18 NOTE — Anesthesia Preprocedure Evaluation (Addendum)
Anesthesia Evaluation  Patient identified by MRN, date of birth, ID band Patient awake    Reviewed: Allergy & Precautions, H&P , NPO status , Patient's Chart, lab work & pertinent test results  History of Anesthesia Complications Negative for: history of anesthetic complications  Airway Mallampati: II   Neck ROM: full    Dental   Pulmonary asthma , sleep apnea , former smoker,    Pulmonary exam normal breath sounds clear to auscultation       Cardiovascular hypertension, Pt. on medications Normal cardiovascular exam Rhythm:Regular Rate:Normal  Echo 4/19  Study Conclusions  - Left ventricle: The cavity size was normal. Wall thickness was   increased in a pattern of mild LVH. Systolic function was   vigorous. The estimated ejection fraction was in the range of 65%   to 70%. Wall motion was normal; there were no regional wall   motion abnormalities. Doppler parameters are consistent with   abnormal left ventricular relaxation (grade 1 diastolic   dysfunction). - Mitral valve: Moderately calcified annulus.   Neuro/Psych PSYCHIATRIC DISORDERS Depression    GI/Hepatic   Endo/Other    Renal/GU      Musculoskeletal  (+) Arthritis , Osteoarthritis,    Abdominal   Peds  Hematology  (+) anemia ,   Anesthesia Other Findings   Reproductive/Obstetrics                            Anesthesia Physical  Anesthesia Plan  ASA: III  Anesthesia Plan: MAC   Post-op Pain Management:  Regional for Post-op pain   Induction:   PONV Risk Score and Plan: 2 and Ondansetron and Treatment may vary due to age or medical condition  Airway Management Planned: Nasal Cannula and Natural Airway  Additional Equipment:   Intra-op Plan:   Post-operative Plan:   Informed Consent: I have reviewed the patients History and Physical, chart, labs and discussed the procedure including the risks, benefits and  alternatives for the proposed anesthesia with the patient or authorized representative who has indicated his/her understanding and acceptance.     Plan Discussed with: CRNA, Surgeon and Anesthesiologist  Anesthesia Plan Comments:         Anesthesia Quick Evaluation

## 2017-12-18 NOTE — Anesthesia Postprocedure Evaluation (Signed)
Anesthesia Post Note  Patient: [name]  Procedure(s) Performed: COLONOSCOPY WITH PROPOFOL (N/A )     Patient location during evaluation: PACU Anesthesia Type: MAC Level of consciousness: awake and alert Pain management: pain level controlled Vital Signs Assessment: post-procedure vital signs reviewed and stable Respiratory status: spontaneous breathing, nonlabored ventilation, respiratory function stable and patient connected to nasal cannula oxygen Cardiovascular status: stable and blood pressure returned to baseline Postop Assessment: no apparent nausea or vomiting Anesthetic complications: no    Last Vitals:  Vitals:   12/18/17 1109 12/18/17 1110  BP:    Pulse: 73 73  Resp: 18 18  Temp:    SpO2: 99% 99%    Last Pain:  Vitals:   12/18/17 1109  TempSrc:   PainSc: 0-No pain                 Ryden Wainer

## 2017-12-18 NOTE — Interval H&P Note (Signed)
History and Physical Interval Note:  12/18/2017 10:13 AM  Holly BlackbirdAngela W Haney  has presented today for surgery, with the diagnosis of screening  The various methods of treatment have been discussed with the patient and family. After consideration of risks, benefits and other options for treatment, the patient has consented to  Procedure(s): COLONOSCOPY WITH PROPOFOL (N/A) as a surgical intervention .  The patient's history has been reviewed, patient examined, no change in status, stable for surgery.  I have reviewed the patient's chart and labs.  Questions were answered to the patient's satisfaction.     [name] [name]

## 2017-12-18 NOTE — Op Note (Addendum)
[facility] Patient Name: [name] Procedure Date: 12/18/2017 MRN: [id number] Attending MD: Iva Boop , MD Date of Birth: 10-26-1955 CSN: [id number] Age: 62 Admit Type: Outpatient Procedure:                Colonoscopy Indications:              Screening for colorectal malignant neoplasm, Last                            colonoscopy: 2009 Providers:                Iva Boop, MD, Norman Clay, RN, Verita Schneiders,                            Technician, [facility] CRNA Referring MD:             [name]. [name] Medicines:                Propofol per Anesthesia, Monitored Anesthesia Care Complications:            No immediate complications. Estimated Blood Loss:     Estimated blood loss: none. Procedure:                Pre-Anesthesia Assessment:                           - Prior to the procedure, a History and Physical                            was performed, and patient medications and                            allergies were reviewed. The patient's tolerance of                            previous anesthesia was also reviewed. The risks                            and benefits of the procedure and the sedation                            options and risks were discussed with the patient.                            All questions were answered, and informed consent                            was obtained. Prior Anticoagulants: The patient has                            taken no previous anticoagulant or antiplatelet                            agents. ASA Grade Assessment: III - A patient with  severe systemic disease. After reviewing the risks                            and benefits, the patient was deemed in                            satisfactory condition to undergo the procedure.                           After obtaining informed consent, the colonoscope                            was passed under direct vision. Throughout the                             procedure, the patient's blood pressure, pulse, and                            oxygen saturations were monitored continuously. The                            EC-[id number] ([id number] scope was introduced through                            the anus and advanced to the the cecum, identified                            by appendiceal orifice and ileocecal valve. The                            colonoscopy was somewhat difficult due to                            significant looping. Successful completion of the                            procedure was aided by applying abdominal pressure.                            The patient tolerated the procedure well. The                            quality of the bowel preparation was good. The                            bowel preparation used was Miralax. The ileocecal                            valve, appendiceal orifice, and rectum were                            photographed. Scope In: 10:23:54 AM Scope Out: 10:40:39 AM Scope Withdrawal Time: 0 hours 11 minutes 26 seconds  Total Procedure Duration: 0 hours 16 minutes  45 seconds  Findings:      The perianal and digital rectal examinations were normal.      The entire examined colon appeared normal on direct and retroflexion       views. Impression:               - The entire examined colon is normal on direct and                            retroflexion views. The cecal views were slightly                            limited (body habitus and looping effects)                           - No specimens collected. Moderate Sedation:      N/A- Per Anesthesia Care Recommendation:           - Patient has a contact number available for                            emergencies. The signs and symptoms of potential                            delayed complications were discussed with the                            patient. Return to normal activities tomorrow.                             Written discharge instructions were provided to the                            patient.                           - Resume previous diet.                           - Continue present medications.                           - She has some fecal smearing and 3-4 soft                            stools/day - ? if could be related to pantoprazole                            so will change to omeprazole and have her give me                            an update by phone or my chart                           If that does not work will consider fiber  supplementation WILL TRY METAMUCIL 1 TBSP/DAY AS                            NOT USING PANTOPRAZOLE BUT RARE PRN                           - Repeat colonoscopy in 10 years for screening                            purposes.                           - She is again encouraged to go to a bariatric                            surgery seminar. Procedure Code(s):        --- Professional ---                           N5621, Colorectal cancer screening; colonoscopy on                            individual not meeting criteria for high risk Diagnosis Code(s):        --- Professional ---                           Z12.11, Encounter for screening for malignant                            neoplasm of colon CPT copyright 2017 [employer] Medical Association. All rights reserved. The codes documented in this report are preliminary and upon coder review may  be revised to meet current compliance requirements. Iva Boop, MD 12/18/2017 10:50:31 AM This report has been signed electronically. Number of Addenda: 0

## 2017-12-18 NOTE — Transfer of Care (Signed)
Immediate Anesthesia Transfer of Care Note  Patient: [name]  Procedure(s) Performed: COLONOSCOPY WITH PROPOFOL (N/A )  Patient Location: PACU  Anesthesia Type:MAC  Level of Consciousness: awake, alert  and oriented  Airway & Oxygen Therapy: Patient Spontanous Breathing  Post-op Assessment: Report given to RN  Post vital signs: Reviewed and stable  Last Vitals:  Vitals Value Taken Time  BP    Temp    Pulse 78 12/18/2017 10:47 AM  Resp 23 12/18/2017 10:47 AM  SpO2 98 % 12/18/2017 10:47 AM  Vitals shown include unvalidated device data.  Last Pain:  Vitals:   12/18/17 0930  TempSrc: Oral  PainSc: 0-No pain         Complications: No apparent anesthesia complications

## 2017-12-18 NOTE — Interval H&P Note (Signed)
History and Physical Interval Note:  12/18/2017 10:13 AM  [name]  has presented today for surgery, with the diagnosis of screening  The various methods of treatment have been discussed with the patient and family. After consideration of risks, benefits and other options for treatment, the patient has consented to  Procedure(s): COLONOSCOPY WITH PROPOFOL (N/A) as a surgical intervention .  The patient's history has been reviewed, patient examined, no change in status, stable for surgery.  I have reviewed the patient's chart and labs.  Questions were answered to the patient's satisfaction.     [name]   

## 2017-12-18 NOTE — Discharge Instructions (Signed)
° °  The colon was normal. Next routine colonoscopy or other screening test in 10 years - 2029  Try taking 1 tbsp Metamucil each night - if that does not fix the stool leakage let me know.  I appreciate the opportunity to care for you. Iva Booparl E. Treva Huyett, MD, FACG  YOU HAD AN ENDOSCOPIC PROCEDURE TODAY: Refer to the procedure report and other information in the discharge instructions given to you for any specific questions about what was found during the examination. If this information does not answer your questions, please call Dr. Marvell FullerGessner's office at [phone] to clarify.   YOU SHOULD EXPECT: Some feelings of bloating in the abdomen. Passage of more gas than usual. Walking can help get rid of the air that was put into your GI tract during the procedure and reduce the bloating. If you had a lower endoscopy (such as a colonoscopy or flexible sigmoidoscopy) you may notice spotting of blood in your stool or on the toilet paper. Some abdominal soreness may be present for a day or two, also.  DIET: Your first meal following the procedure should be a light meal and then it is ok to progress to your normal diet. A half-sandwich or bowl of soup is an example of a good first meal. Heavy or fried foods are harder to digest and may make you feel nauseous or bloated. Drink plenty of fluids but you should avoid alcoholic beverages for 24 hours.   ACTIVITY: Your care partner should take you home directly after the procedure. You should plan to take it easy, moving slowly for the rest of the day. You can resume normal activity the day after the procedure however YOU SHOULD NOT DRIVE, use power tools, machinery or perform tasks that involve climbing or major physical exertion for 24 hours (because of the sedation medicines used during the test).   SYMPTOMS TO REPORT IMMEDIATELY: A gastroenterologist can be reached at any hour. Please call [phone]  for any of the following symptoms:  Following lower  endoscopy (colonoscopy, flexible sigmoidoscopy) Excessive amounts of blood in the stool  Significant tenderness, worsening of abdominal pains  Swelling of the abdomen that is new, acute  Fever of 100 or higher  Following upper endoscopy (EGD, EUS, ERCP, esophageal dilation) Vomiting of blood or coffee ground material  New, significant abdominal pain  New, significant chest pain or pain under the shoulder blades  Painful or persistently difficult swallowing  New shortness of breath  Black, tarry-looking or red, bloody stools  FOLLOW UP:  If any biopsies were taken you will be contacted by phone or by letter within the next 1-3 weeks. Call [phone]  if you have not heard about the biopsies in 3 weeks.  Please also call with any specific questions about appointments or follow up tests.

## 2017-12-19 ENCOUNTER — Encounter (HOSPITAL_COMMUNITY): Payer: Self-pay | Admitting: Internal Medicine

## 2018-01-10 DIAGNOSIS — G4733 Obstructive sleep apnea (adult) (pediatric): Secondary | ICD-10-CM | POA: Diagnosis not present

## 2018-01-25 ENCOUNTER — Encounter: Payer: Self-pay | Admitting: Internal Medicine

## 2018-01-29 DIAGNOSIS — L03213 Periorbital cellulitis: Secondary | ICD-10-CM | POA: Diagnosis not present

## 2018-01-29 DIAGNOSIS — H10413 Chronic giant papillary conjunctivitis, bilateral: Secondary | ICD-10-CM | POA: Diagnosis not present

## 2018-01-29 DIAGNOSIS — H01024 Squamous blepharitis left upper eyelid: Secondary | ICD-10-CM | POA: Diagnosis not present

## 2018-01-29 DIAGNOSIS — H01021 Squamous blepharitis right upper eyelid: Secondary | ICD-10-CM | POA: Diagnosis not present

## 2018-01-29 DIAGNOSIS — H01022 Squamous blepharitis right lower eyelid: Secondary | ICD-10-CM | POA: Diagnosis not present

## 2018-01-31 DIAGNOSIS — H04322 Acute dacryocystitis of left lacrimal passage: Secondary | ICD-10-CM | POA: Diagnosis not present

## 2018-01-31 DIAGNOSIS — H01022 Squamous blepharitis right lower eyelid: Secondary | ICD-10-CM | POA: Diagnosis not present

## 2018-01-31 DIAGNOSIS — H01024 Squamous blepharitis left upper eyelid: Secondary | ICD-10-CM | POA: Diagnosis not present

## 2018-01-31 DIAGNOSIS — H01021 Squamous blepharitis right upper eyelid: Secondary | ICD-10-CM | POA: Diagnosis not present

## 2018-01-31 DIAGNOSIS — L03213 Periorbital cellulitis: Secondary | ICD-10-CM | POA: Diagnosis not present

## 2018-02-14 ENCOUNTER — Encounter: Payer: Self-pay | Admitting: Internal Medicine

## 2018-02-14 ENCOUNTER — Ambulatory Visit (INDEPENDENT_AMBULATORY_CARE_PROVIDER_SITE_OTHER): Payer: Medicare Other | Admitting: Internal Medicine

## 2018-02-14 VITALS — BP 122/78 | HR 70 | Ht 65.0 in | Wt 362.6 lb

## 2018-02-14 DIAGNOSIS — J4520 Mild intermittent asthma, uncomplicated: Secondary | ICD-10-CM

## 2018-02-14 DIAGNOSIS — G4733 Obstructive sleep apnea (adult) (pediatric): Secondary | ICD-10-CM

## 2018-02-14 DIAGNOSIS — J4540 Moderate persistent asthma, uncomplicated: Secondary | ICD-10-CM

## 2018-02-14 DIAGNOSIS — Z6841 Body Mass Index (BMI) 40.0 and over, adult: Secondary | ICD-10-CM

## 2018-02-14 DIAGNOSIS — J45901 Unspecified asthma with (acute) exacerbation: Secondary | ICD-10-CM | POA: Diagnosis not present

## 2018-02-14 DIAGNOSIS — Z9989 Dependence on other enabling machines and devices: Secondary | ICD-10-CM

## 2018-02-14 MED ORDER — ALBUTEROL SULFATE HFA 108 (90 BASE) MCG/ACT IN AERS: 1.0000 | INHALATION_SPRAY | Freq: Four times a day (QID) | RESPIRATORY_TRACT | 4 refills | Status: DC | PRN

## 2018-02-14 MED ORDER — BUDESONIDE-FORMOTEROL FUMARATE 160-4.5 MCG/ACT IN AERO: 2.0000 | INHALATION_SPRAY | Freq: Two times a day (BID) | RESPIRATORY_TRACT | 4 refills | Status: DC

## 2018-02-14 NOTE — Patient Instructions (Addendum)
We can continue CPAP auto 5-15, mask of choice, humidifier, supplies, AirView  Please try to use your CPAP all night, every night  [facility]   Need to try a nasal pillows style to avoid pressure near her eye.  Refill script sent for Symbicort and albuterol  Please call call if we can help

## 2018-02-14 NOTE — Assessment & Plan Note (Signed)
Good control without need for frequent rescue inhaler, or associated sleep disturbance.  Medications refilled at her request.

## 2018-02-14 NOTE — Assessment & Plan Note (Signed)
She has never really established a regular use pattern meeting compliance goals.  I reviewed this with her again today.  Her mask may be part of the problem if it was contributing to development of the cyst on her eyelid's we will try changing to a nasal pillows designed. Plan-mask fitting.  Emphasis on compliance.

## 2018-02-14 NOTE — Progress Notes (Signed)
HPI female former smoker followed for OSA, complicated by morbid obesity, allergic rhinitis, asthma, HBP, hyperlipidemia, diastolic dysfunction, anemia Unattended Home Sleep Test 01/19/17-AHI 8.9/hour, desaturation to 92%, body weight 339 pounds  -------------------------------------------------------------------------------------------- 11/06/2017- 62 year old female former smoker followed for OSA, complicated by morbid obesity, allergic rhinitis, asthma, HBP, hyperlipidemia, diastolic dysfunction, anemia CPAP auto 5-20/Advanced ----4 month follow up for OSA. Per patient, she has been feeling ok. She does have nights where she can not sleep even with the cpap machine.  Weight today 354 pounds She reports staying in "to avoid heat and pollen".  Has been having to go to her mother's home frequently at night to care for her, leaving CPAP.  We reviewed compliance issues.  Download 23% AHI 1.5/hour. She has awakened at times smothered/panicked.  Episodes are self-limited after she sits up but she does not put the mask back on when she lies down and has not been introspective about what triggered the sensation.  Apparently there is not sustained wheeze or cough.  02/14/2018- 62 year old female former smoker followed for OSA, complicated by morbid obesity, allergic rhinitis, asthma, HBP, hyperlipidemia, diastolic dysfunction, anemia CPAP auto 5-15/Advanced -----OSA: DME: [facility]. Pt has not been able to wear CPAP due to cyst on bridge of nose-on abx and other meds to help clear it up. This has been going on for about 1 month. DL attached.  Weight today is 362 pounds Ambien 5 mg, Singulair, albuterol HFA, Symbicort 160, Xyzal She had a cyst on her left eyelid possibly related to her full facemask and had to stop using CPAP.  Cyst is now improving with antibiotics.  Download compliance 40% AHI 0.9/hour No bad asthma as she continues Symbicort.  Using rescue inhaler 2 or 3 times a week with rare need to use at  nighttime.  CT chest 10/25/2017- ordered and followed elsewhyere IMPRESSION: 1. No central pulmonary embolus seen. Question of filling defect within the pulmonary artery branches to the left lower lobe, though this may be artifactual in nature given limitations in the timing of the contrast bolus. 2. Small bilateral pulmonary nodules are likely benign, given stability from 2015. 3. Mild coronary artery calcification. Calcification at the mitral valve  ROS-see HPI    "+" = positive Constitutional:    weight loss, night sweats, fevers, chills, fatigue, lassitude. HEENT:    headaches, difficulty swallowing, tooth/dental problems, sore throat,       sneezing, +itching, ear ache, nasal congestion, post nasal drip, snoring CV:    chest pain, orthopnea, PND, swelling in lower extremities, anasarca,                                                 dizziness, palpitations Resp:   +shortness of breath with exertion or at rest.                productive cough,   non-productive cough, coughing up of blood.              change in color of mucus.  + wheezing.   Skin:    rash or lesions. GI:  No-   heartburn, indigestion, abdominal pain, nausea, vomiting, diarrhea,                 change in bowel habits, loss of appetite GU: dysuria, change in color of urine, no urgency or frequency.  flank pain. MS:   +joint pain, stiffness, decreased range of motion, back pain. Neuro-     nothing unusual Psych:  change in mood or affect.  depression or anxiety.   memory loss.  OBJ- Physical Exam General- Alert, Oriented, Affect-appropriate, Distress- none acute, + Morbidly obese,  Skin- rash-none, lesions- none, excoriation- none Lymphadenopathy- none Head- atraumatic            Eyes- Gross vision intact, PERRLA, conjunctivae and secretions clear. ? Minimal lesion medial L upper lid.             Ears- Hearing, canals-normal            Nose- Clear, no-Septal dev, mucus, polyps, erosion, perforation              Throat- Mallampati II-III , mucosa clear , drainage- none, tonsils- atrophic, + missing teeth Neck- flexible , trachea midline, no stridor , thyroid nl, carotid no bruit Chest - symmetrical excursion , unlabored           Heart/CV- RRR , no murmur heard , no gallop  , no rub, nl s1 s2                           - JVD- none , edema- none, stasis changes- none, varices- none           Lung-clear, wheeze-none, cough-none,  dullness-none, rub- none           Chest wall-  Abd-  Br/ Gen/ Rectal- Not done, not indicated Extrem- cyanosis- none, clubbing, none, atrophy- none, strength- nl Neuro- grossly intact to observation

## 2018-02-14 NOTE — Assessment & Plan Note (Signed)
She is really morbidly obese and has gained more weight since last visit.  Her PCP may want to make bariatric referral.

## 2018-02-19 DIAGNOSIS — H01024 Squamous blepharitis left upper eyelid: Secondary | ICD-10-CM | POA: Diagnosis not present

## 2018-02-19 DIAGNOSIS — H01022 Squamous blepharitis right lower eyelid: Secondary | ICD-10-CM | POA: Diagnosis not present

## 2018-02-19 DIAGNOSIS — H04322 Acute dacryocystitis of left lacrimal passage: Secondary | ICD-10-CM | POA: Diagnosis not present

## 2018-02-19 DIAGNOSIS — H01021 Squamous blepharitis right upper eyelid: Secondary | ICD-10-CM | POA: Diagnosis not present

## 2018-02-19 DIAGNOSIS — H01025 Squamous blepharitis left lower eyelid: Secondary | ICD-10-CM | POA: Diagnosis not present

## 2018-03-08 ENCOUNTER — Ambulatory Visit: Payer: Medicare Other | Admitting: Internal Medicine

## 2018-03-13 ENCOUNTER — Encounter (INDEPENDENT_AMBULATORY_CARE_PROVIDER_SITE_OTHER): Payer: Self-pay | Admitting: Orthopaedic Surgery

## 2018-03-13 ENCOUNTER — Ambulatory Visit (INDEPENDENT_AMBULATORY_CARE_PROVIDER_SITE_OTHER): Payer: Self-pay

## 2018-03-13 ENCOUNTER — Ambulatory Visit (INDEPENDENT_AMBULATORY_CARE_PROVIDER_SITE_OTHER): Payer: Medicare Other | Admitting: Orthopaedic Surgery

## 2018-03-13 ENCOUNTER — Ambulatory Visit (HOSPITAL_BASED_OUTPATIENT_CLINIC_OR_DEPARTMENT_OTHER): Payer: Medicare Other | Attending: Internal Medicine | Admitting: Radiology

## 2018-03-13 DIAGNOSIS — M1612 Unilateral primary osteoarthritis, left hip: Secondary | ICD-10-CM | POA: Diagnosis not present

## 2018-03-13 DIAGNOSIS — M25562 Pain in left knee: Secondary | ICD-10-CM

## 2018-03-13 DIAGNOSIS — G4733 Obstructive sleep apnea (adult) (pediatric): Secondary | ICD-10-CM

## 2018-03-13 DIAGNOSIS — G8929 Other chronic pain: Secondary | ICD-10-CM

## 2018-03-13 DIAGNOSIS — Z6841 Body Mass Index (BMI) 40.0 and over, adult: Secondary | ICD-10-CM | POA: Diagnosis not present

## 2018-03-13 NOTE — Progress Notes (Signed)
Office Visit Note   Patient: [name]           Date of Birth: [birth year]           MRN: [id number] Visit Date: 03/13/2018              Requested by: [name], [name], MD [address] [place], [place] [address] PCP: [name], [name], MD   Assessment & Plan: Visit Diagnoses:  1. Primary osteoarthritis of left hip   2. Chronic pain of left knee   3. Morbid obesity (HCC)   4. Body mass index 60.0-69.9, adult (HCC)     Plan: Overall her main issue is bone-on-bone advanced left hip degenerative joint disease.  Unfortunately she is not a surgical candidate at this time.  We will arrange to have her get a left hip injection in hopes of giving her some pain relief so that this can facilitate exercise and weight loss.  We will again place a referral for Dr. Dalbert Garnet for weight loss.  Unfortunately her BMI is greater than 60 and I stressed the importance of weight loss with her today. The patient meets the AMA guidelines for Morbid (severe) obesity with a BMI > 40.0 and I have recommended weight loss.  Follow-Up Instructions: Return if symptoms worsen or fail to improve.   Orders:  Orders Placed This Encounter  Procedures  . XR HIP UNILAT W OR W/O PELVIS 2-3 VIEWS LEFT  . XR KNEE 3 VIEW LEFT  . Amb Ref to Medical Weight Management  . Ambulatory referral to Physical Medicine Rehab   No orders of the defined types were placed in this encounter.     Procedures: No procedures performed   Clinical Data: No additional findings.   Subjective: Chief Complaint  Patient presents with  . Left Hip - Pain  . Left Knee - Pain    [name] is a very pleasant 62 year old female comes in with mainly left hip pain that radiates to the left knee.  She is status post left total knee replacement several years ago by Dr. Myrtie Neither.  She states that the hip is significantly limiting her physical capacity.  She has constant groin pain that is worse with standing and activity.  She feels  a locking sensation.  She takes ibuprofen.   Review of Systems  Constitutional: Negative.   HENT: Negative.   Eyes: Negative.   Respiratory: Negative.   Cardiovascular: Negative.   Endocrine: Negative.   Musculoskeletal: Negative.   Neurological: Negative.   Hematological: Negative.   Psychiatric/Behavioral: Negative.   All other systems reviewed and are negative.    Objective: Vital Signs: LMP 09/09/2009   Physical Exam  Constitutional: She is oriented to person, place, and time. She appears well-developed and well-nourished.  Pulmonary/Chest: Effort normal.  Neurological: She is alert and oriented to person, place, and time.  Skin: Skin is warm. Capillary refill takes less than 2 seconds.  Psychiatric: She has a normal mood and affect. Her behavior is normal. Judgment and thought content normal.  Nursing note and vitals reviewed.   Ortho Exam Left hip exam shows minimal range of motion with significant catching and pain with rotation. Left knee exam shows a fully healed surgical scar.  Range of motion is normal.  She does have some slight varus and valgus jog. Specialty Comments:  No specialty comments available.  Imaging: Xr Hip Unilat W Or W/o Pelvis 2-3 Views Left  Result Date: 03/13/2018 Bone-on-bone joint space narrowing  consistent with advanced degenerative joint disease  Xr Knee 3 View Left  Result Date: 03/13/2018 Stable left total knee replacement without evidence of complication.    PMFS History: Patient Active Problem List   Diagnosis Date Noted  . Insomnia 11/06/2017  . Chest pain 10/25/2017  . Morbid obesity with BMI of 50.0-59.9, adult (HCC) 10/25/2017  . Asthma in adult, mild intermittent, uncomplicated 10/25/2017  . Hypertension 10/25/2017  . OSA on CPAP 10/25/2017  . Left ventricular diastolic dysfunction 10/25/2017  . Positive D dimer 10/25/2017  . Normochromic anemia 10/25/2017  . Low vitamin B12 level 10/25/2017  . Depression, major,  single episode, moderate (HCC) 09/29/2017  . 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 B12 deficiency 09/27/2016  . Hyperlipidemia 04/02/2012  . Lower abdominal pain 12/30/2010  . Preventative health care 09/10/2010  . LUNG NODULE 05/10/2010  . Low back pain 01/20/2009  . Osteoarthritis 06/02/2008  . ANEMIA-NOS 08/15/2006  . Essential hypertension 08/11/2006  . Allergic rhinitis 08/11/2006   Past Medical History:  Diagnosis Date  . Abnormal EKG 06/2003   History of inverted T waves V1-V3. Normal 2D echo (07/23/2003): LVEF 65%.  . Anemia    BL Hgb 11-12. Ferritin 14 - low normal (08/2007). Colonoscopy 2009 - external hemorrhoids (excellent prep). Last anemia panel (12/2010) - Iron  24, TIBC 269,  B12  316, Folate 11.9, Ferritin 73.  . Asthma   . Degenerative joint disease    BL knees (L>R), lumbar spine. Followed by Sports Medicine, Dr. Jennette Kettle.  . History of multiple pulmonary nodules    Incidental finding: CT Abd/ Pelvis (04/2010) - Several small lower lobe lung nodules, including one pure ground-glass pulmonary nodule measuring 8 mm in the left lower lobe.  Recommend follow-up chest CT (IV contrast preferred) in 6 months to document stability. //  CT Abd/ Pelvis (07/2010) -  3 mm RLL and  8 mm LLL nodule stable.  Other nodules unchanged, likely benign.  . Hyperlipidemia   . Hypertension   . Incarcerated ventral hernia 04/2010   Noted on CT Abd/ pelvis (04/2010). Patient now s/p ventral hernia repair by Dr. Gerrit Friends (12/2010)  . 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 Lower extremity edema    Chronic. 2D echo (2005) - EF 65%.  . Obesity     Family History  Problem Relation Age of Onset  . Diabetes Mother   . Hypertension Mother   . Stroke Mother   . Dementia Father   . Heart disease Father   . Hypertension Sister   . Diabetes Brother   . Hypertension Brother   . Rashes / Skin problems Maternal Grandfather   . Heart attack Neg Hx       Past Surgical History:  Procedure Laterality Date  . COLONOSCOPY  2009  . COLONOSCOPY WITH PROPOFOL N/A 12/18/2017   Procedure: COLONOSCOPY WITH PROPOFOL;  Surgeon: Iva Boop, MD;  Location: [facility];  Service: Endoscopy;  Laterality: N/A;  . FOOT SURGERY  2004    left  . INCISION AND DRAINAGE ABSCESS ANAL  07/2008   I&D and debridgement of anorectal abscess  . INCISIONAL HERNIA REPAIR  12/2010   Repair of incarcerated ventral incisional hernia with Ethicon mesh patch - performed by Dr. Gerrit Friends.   . INGUINAL HERNIA REPAIR    . JOINT REPLACEMENT Left 2013   left knee  . TOTAL KNEE ARTHROPLASTY  06/16/2011   Left TOTAL KNEE ARTHROPLASTY;  Surgeon: Kennieth Rad;  Location: [facility] OR;  Service: Orthopedics;  Laterality: Left;  left total knee arthroplasty  . TUBAL LIGATION     Social History   Occupational History  . Not on file  Tobacco Use  . Smoking status: Former Smoker    Packs/day: 0.50    Years: 20.00    Pack years: 10.00    Types: Cigarettes    Last attempt to quit: 09/10/1998    Years since quitting: 19.5  . Smokeless tobacco: Never Used  . Tobacco comment: 62 yo   Substance and Sexual Activity  . Alcohol use: No    Alcohol/week: 0.0 standard drinks  . Drug use: No  . Sexual activity: Yes    Partners: Male

## 2018-03-15 DIAGNOSIS — I10 Essential (primary) hypertension: Secondary | ICD-10-CM | POA: Diagnosis not present

## 2018-03-15 DIAGNOSIS — G4733 Obstructive sleep apnea (adult) (pediatric): Secondary | ICD-10-CM | POA: Diagnosis not present

## 2018-03-15 DIAGNOSIS — R269 Unspecified abnormalities of gait and mobility: Secondary | ICD-10-CM | POA: Diagnosis not present

## 2018-03-15 DIAGNOSIS — M79609 Pain in unspecified limb: Secondary | ICD-10-CM | POA: Diagnosis not present

## 2018-03-15 DIAGNOSIS — M519 Unspecified thoracic, thoracolumbar and lumbosacral intervertebral disc disorder: Secondary | ICD-10-CM | POA: Diagnosis not present

## 2018-03-26 ENCOUNTER — Ambulatory Visit (INDEPENDENT_AMBULATORY_CARE_PROVIDER_SITE_OTHER): Payer: Self-pay | Admitting: Physical Medicine and Rehabilitation

## 2018-04-08 NOTE — Progress Notes (Deleted)
HPI:   [name] is a 62 y.o. female, who is here today for 6 months follow up.   She was last seen on 11/07/17.  Since her last OV she has seen pulmonologist and orthopedist. She also had her colonoscopy in 12/2017.  B12 deficiency: She is on B12 1000 mcg IM montly.   Lab Results  Component Value Date   VITAMINB12 89 (L) 10/25/2017    {4+ HPI elements (or status of 3 or more chronic diseases)} ***  Chronic back pain ,OA, ,and depression. She is on Cymbalta 30 mg daily.  Also on Voltaren 75 mg bid and Zanaflex 2 mg tid prn.  *** Hypertension:    Currently on Lisinopril-HCTZ 20-25 mg daily.    ***taking medications as instructed, no side effects reported.  ***has not noted unusual headache, visual changes, exertional chest pain, dyspnea,  focal weakness, or edema.   Lab Results  Component Value Date   CREATININE 0.72 10/27/2017   BUN 9 10/27/2017   NA 137 10/27/2017   K 4.3 10/27/2017   CL 108 10/27/2017   CO2 19 (L) 10/27/2017        Review of Systems  [review of 2 to 9 systems] ***  Current Outpatient Medications on File Prior to Visit  Medication Sig Dispense Refill  . albuterol (PROVENTIL HFA;VENTOLIN HFA) 108 (90 Base) MCG/ACT inhaler Inhale 1-2 puffs into the lungs every 6 (six) hours as needed for wheezing or shortness of breath. 3 Inhaler 4  . amoxicillin-clavulanate (AUGMENTIN) 875-125 MG tablet Take 1 tablet by mouth 2 (two) times daily.  0  . budesonide-formoterol (SYMBICORT) 160-4.5 MCG/ACT inhaler Inhale 2 puffs into the lungs 2 (two) times daily. 3 Inhaler 4  . diclofenac (VOLTAREN) 75 MG EC tablet Take 75 mg by mouth 2 (two) times daily.   1  . DULoxetine (CYMBALTA) 30 MG capsule Take 1 capsule (30 mg total) by mouth daily. 30 capsule 1  . levocetirizine (XYZAL) 5 MG tablet Take 1 tablet (5 mg total) by mouth daily as needed for allergies.    [name] lisinopril-hydrochlorothiazide (PRINZIDE,ZESTORETIC) 20-25 MG tablet Take 1  tablet by mouth daily. 90 tablet 1  . montelukast (SINGULAIR) 10 MG tablet Take 1 tablet (10 mg total) by mouth at bedtime. (Patient taking differently: Take 10 mg by mouth daily as needed (Asthma). ) 90 tablet 1  . neomycin-polymyxin b-dexamethasone (MAXITROL) 3.5-10000-0.1 OINT APPPLY A THIN LAYER ON EYELID FOUR TIMES A DAY  0  . pantoprazole (PROTONIX) 40 MG tablet Take 1 tablet (40 mg total) by mouth daily as needed (for acid reflux).    . Psyllium (METAMUCIL) 28.3 % POWD 1 tablespoon nightly    . tiZANidine (ZANAFLEX) 2 MG tablet Take 1 tablet (2 mg total) by mouth every 8 (eight) hours as needed for muscle spasms. 45 tablet 1  . zolpidem (AMBIEN) 5 MG tablet 1 at bedtime if needed for sleep (Patient taking differently: Take 5 mg by mouth at bedtime as needed for sleep. ) 30 tablet 5   No current facility-administered medications on file prior to visit.      Past Medical History:  Diagnosis Date  . Abnormal EKG 06/2003   History of inverted T waves V1-V3. Normal 2D echo (07/23/2003): LVEF 65%.  . Anemia    BL Hgb 11-12. Ferritin 14 - low normal (08/2007). Colonoscopy 2009 - external hemorrhoids (excellent prep). Last anemia panel (12/2010) - Iron  24, TIBC 269,  B12  316, Folate  11.9, Ferritin 73.  . Asthma   . Degenerative joint disease    BL knees (L>R), lumbar spine. Followed by Sports Medicine, Dr. Jennette Kettle.  . History of multiple pulmonary nodules    Incidental finding: CT Abd/ Pelvis (04/2010) - Several small lower lobe lung nodules, including one pure ground-glass pulmonary nodule measuring 8 mm in the left lower lobe.  Recommend follow-up chest CT (IV contrast preferred) in 6 months to document stability. //  CT Abd/ Pelvis (07/2010) -  3 mm RLL and  8 mm LLL nodule stable.  Other nodules unchanged, likely benign.  . Hyperlipidemia   . Hypertension   . Incarcerated ventral hernia 04/2010   Noted on CT Abd/ pelvis (04/2010). Patient now s/p ventral hernia repair by Dr. Gerrit Friends  (12/2010)  . 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 Lower extremity edema    Chronic. 2D echo (2005) - EF 65%.  . Obesity    Allergies  Allergen Reactions  . Ketorolac Tromethamine Shortness Of Breath and Palpitations  . Atrovent [Ipratropium] Hives and Rash  . Latex Other (See Comments)    Powdered. Confirm type of reaction with patient.    Social History   Socioeconomic History  . Marital status: Married    Spouse name: Not on file  . Number of children: 3  . Years of education: Not on file  . Highest education level: Not on file  Occupational History  . Not on file  Social Needs  . Financial resource strain: Not on file  . Food insecurity:    Worry: Not on file    Inability: Not on file  . Transportation needs:    Medical: Not on file    Non-medical: Not on file  Tobacco Use  . Smoking status: Former Smoker    Packs/day: 0.50    Years: 20.00    Pack years: 10.00    Types: Cigarettes    Last attempt to quit: 09/10/1998    Years since quitting: 19.5  . Smokeless tobacco: Never Used  . Tobacco comment: 62 yo   Substance and Sexual Activity  . Alcohol use: No    Alcohol/week: 0.0 standard drinks  . Drug use: No  . Sexual activity: Yes    Partners: Male  Lifestyle  . Physical activity:    Days per week: Not on file    Minutes per session: Not on file  . Stress: Not on file  Relationships  . Social connections:    Talks on phone: Not on file    Gets together: Not on file    Attends religious service: Not on file    Active member of club or organization: Not on file    Attends meetings of clubs or organizations: Not on file    Relationship status: Not on file  Other Topics Concern  . Not on file  Social History Narrative   Patient is married and has 3 children   No alcohol tobacco or drug use reported    There were no vitals filed for this visit. There is no height or weight on file to calculate BMI.      Physical  Exam  {[12+ exam elements]} ***  ASSESSMENT AND PLAN:   [name] was seen today for *** months follow-up.  No orders of the defined types were placed in this encounter.   No problem-specific Assessment & Plan notes found for this encounter.           -  Ms. KEYATTA TOLLES was advised to return sooner than planned today if new concerns arise.       Christie Viscomi G. [name], MD  [facility]. [place] office.

## 2018-04-09 ENCOUNTER — Ambulatory Visit: Payer: Medicare Other | Admitting: Family Medicine

## 2018-04-11 ENCOUNTER — Ambulatory Visit (INDEPENDENT_AMBULATORY_CARE_PROVIDER_SITE_OTHER): Payer: Medicare Other | Admitting: Physical Medicine and Rehabilitation

## 2018-04-11 ENCOUNTER — Ambulatory Visit (INDEPENDENT_AMBULATORY_CARE_PROVIDER_SITE_OTHER): Payer: Self-pay

## 2018-04-11 ENCOUNTER — Encounter (INDEPENDENT_AMBULATORY_CARE_PROVIDER_SITE_OTHER): Payer: Self-pay | Admitting: Physical Medicine and Rehabilitation

## 2018-04-11 DIAGNOSIS — M25552 Pain in left hip: Secondary | ICD-10-CM

## 2018-04-11 DIAGNOSIS — M25551 Pain in right hip: Secondary | ICD-10-CM

## 2018-04-11 NOTE — Progress Notes (Signed)
   [name] - 62 y.o. female MRN [id number]  Date of birth: October 20, 1955  Office Visit Note: Visit Date: 04/11/2018 PCP: [name], [name], MD Referred by: [name], [name], MD  Subjective: Chief Complaint  Patient presents with  . Left Hip - Pain  . Left Leg - Pain   HPI: Planned anesthetic diagnostic and therapeutic hip arthrogram on the left.  Patient has severe osteoarthritis of the left hip.  BMI is greater than 60.  She will continue to follow-up with Dr. Glee Arvin.   ROS Otherwise per HPI.  Assessment & Plan: Visit Diagnoses:  1. Pain in left hip     Plan: Findings:  Patient did get some relief during the anesthetic phase of the injection.    Meds & Orders: No orders of the defined types were placed in this encounter.   Orders Placed This Encounter  Procedures  . Large Joint Inj: L hip joint  . XR C-ARM NO REPORT    Follow-up: Return if symptoms worsen or fail to improve, for Dr. Roda Shutters.   Procedures: Large Joint Inj: L hip joint on 04/11/2018 10:51 AM Indications: pain and diagnostic evaluation Details: 22 G needle, anterior approach  Arthrogram: Yes  Medications: 3 mL bupivacaine 0.5 %; 80 mg triamcinolone acetonide 40 MG/ML Outcome: tolerated well, no immediate complications  Arthrogram demonstrated excellent flow of contrast throughout the joint surface without extravasation or obvious defect.  The patient had relief of symptoms during the anesthetic phase of the injection.  Procedure, treatment alternatives, risks and benefits explained, specific risks discussed. Consent was given by the patient. Immediately prior to procedure a time out was called to verify the correct patient, procedure, equipment, support staff and site/side marked as required. Patient was prepped and draped in the usual sterile fashion.      No notes on file   Clinical History: No specialty comments available.     Objective:  VS:  HT:    WT:   BMI:     BP:   HR: bpm  TEMP: ( )   RESP:  Physical Exam  Ortho Exam Imaging: No results found.

## 2018-04-11 NOTE — Progress Notes (Signed)
 .  Numeric Pain Rating Scale and Functional Assessment Average Pain 8   In the last MONTH (on 0-10 scale) has pain interfered with the following?  1. General activity like being  able to carry out your everyday physical activities such as walking, climbing stairs, carrying groceries, or moving a chair?  Rating(4)   -Dye Allergies.  

## 2018-04-11 NOTE — Patient Instructions (Signed)

## 2018-04-14 DIAGNOSIS — R269 Unspecified abnormalities of gait and mobility: Secondary | ICD-10-CM | POA: Diagnosis not present

## 2018-04-14 DIAGNOSIS — I10 Essential (primary) hypertension: Secondary | ICD-10-CM | POA: Diagnosis not present

## 2018-04-19 MED ORDER — BUPIVACAINE HCL 0.5 % IJ SOLN
3.0000 mL | INTRAMUSCULAR | Status: AC | PRN
  Administered 2018-04-11: 3 mL via INTRA_ARTICULAR

## 2018-04-19 MED ORDER — TRIAMCINOLONE ACETONIDE 40 MG/ML IJ SUSP
80.0000 mg | INTRAMUSCULAR | Status: AC | PRN
  Administered 2018-04-11: 80 mg via INTRA_ARTICULAR

## 2018-05-02 ENCOUNTER — Telehealth: Payer: Self-pay | Admitting: Family Medicine

## 2018-05-02 ENCOUNTER — Ambulatory Visit (INDEPENDENT_AMBULATORY_CARE_PROVIDER_SITE_OTHER): Payer: Medicare Other | Admitting: Family Medicine

## 2018-05-02 ENCOUNTER — Encounter: Payer: Self-pay | Admitting: Family Medicine

## 2018-05-02 VITALS — BP 140/80 | HR 63 | Temp 98.4°F | Resp 16 | Ht 65.0 in | Wt 367.5 lb

## 2018-05-02 DIAGNOSIS — E538 Deficiency of other specified B group vitamins: Secondary | ICD-10-CM | POA: Diagnosis not present

## 2018-05-02 DIAGNOSIS — I10 Essential (primary) hypertension: Secondary | ICD-10-CM | POA: Diagnosis not present

## 2018-05-02 DIAGNOSIS — M545 Low back pain, unspecified: Secondary | ICD-10-CM

## 2018-05-02 DIAGNOSIS — R739 Hyperglycemia, unspecified: Secondary | ICD-10-CM

## 2018-05-02 DIAGNOSIS — Z9989 Dependence on other enabling machines and devices: Secondary | ICD-10-CM

## 2018-05-02 DIAGNOSIS — Z23 Encounter for immunization: Secondary | ICD-10-CM | POA: Diagnosis not present

## 2018-05-02 DIAGNOSIS — G4733 Obstructive sleep apnea (adult) (pediatric): Secondary | ICD-10-CM

## 2018-05-02 DIAGNOSIS — G8929 Other chronic pain: Secondary | ICD-10-CM

## 2018-05-02 DIAGNOSIS — Z6841 Body Mass Index (BMI) 40.0 and over, adult: Secondary | ICD-10-CM

## 2018-05-02 LAB — COMPREHENSIVE METABOLIC PANEL
ALT: 10 U/L (ref 0–35)
AST: 14 U/L (ref 0–37)
Albumin: 4 g/dL (ref 3.5–5.2)
Alkaline Phosphatase: 99 U/L (ref 39–117)
BUN: 11 mg/dL (ref 6–23)
CO2: 27 mEq/L (ref 19–32)
Calcium: 9.4 mg/dL (ref 8.4–10.5)
Chloride: 107 mEq/L (ref 96–112)
Creatinine, Ser: 0.71 mg/dL (ref 0.40–1.20)
GFR: 107.59 mL/min (ref >60.00)
Glucose, Bld: 92 mg/dL (ref 70–99)
Potassium: 4 mEq/L (ref 3.5–5.1)
Sodium: 141 mEq/L (ref 135–145)
Total Bilirubin: 0.4 mg/dL (ref 0.2–1.2)
Total Protein: 6.8 g/dL (ref 6.0–8.3)

## 2018-05-02 LAB — HEMOGLOBIN A1C: Hgb A1c MFr Bld: 5.7 % (ref 4.6–6.5)

## 2018-05-02 LAB — VITAMIN B12: Vitamin B-12: >1525 pg/mL — ABNORMAL HIGH (ref 211–911)

## 2018-05-02 MED ORDER — LIRAGLUTIDE -WEIGHT MANAGEMENT 18 MG/3ML ~~LOC~~ SOPN: 3.0000 mg | PEN_INJECTOR | Freq: Every day | SUBCUTANEOUS | 2 refills | Status: DC

## 2018-05-02 MED ORDER — DULOXETINE HCL 60 MG PO CPEP: 60.0000 mg | ORAL_CAPSULE | Freq: Every day | ORAL | 2 refills | Status: DC

## 2018-05-02 MED ORDER — CYANOCOBALAMIN 1000 MCG/ML IJ SOLN
1000.0000 ug | Freq: Once | INTRAMUSCULAR | Status: AC
  Administered 2018-05-02: 1000 ug via INTRAMUSCULAR

## 2018-05-02 NOTE — Assessment & Plan Note (Signed)
After verbal consent she received B12 1000 mcg IM here in the office. Further recommendation will be given according to lab results.

## 2018-05-02 NOTE — Patient Instructions (Addendum)
A few things to remember from today's visit:   B12 deficiency  Essential hypertension  Morbid obesity with BMI of 50.0-59.9, adult (HCC) - Plan: Liraglutide -Weight Management (SAXENDA) 18 MG/3ML SOPN  Chronic low back pain, unspecified back pain laterality, unspecified whether sciatica present - Plan: DULoxetine (CYMBALTA) 60 MG capsule Wt Readings from Last 3 Encounters:  05/02/18 (!) 367 lb 8 oz (166.7 kg)  02/14/18 (!) 362 lb 9.6 oz (164.5 kg)  12/18/17 (!) 361 lb (163.7 kg)    Liraglutide injection (Weight Management) What is this medicine? LIRAGLUTIDE (LIR a GLOO tide) is used with a reduced calorie diet and exercise to help you lose weight. This medicine may be used for other purposes; ask your health care provider or pharmacist if you have questions. COMMON BRAND NAME(S): Saxenda What should I tell my health care provider before I take this medicine? They need to know if you have any of these conditions: -endocrine tumors (MEN 2) or if someone in your family had these tumors -gallbladder disease -high cholesterol -history of alcohol abuse problem -history of pancreatitis -kidney disease or if you are on dialysis -liver disease -previous swelling of the tongue, face, or lips with difficulty breathing, difficulty swallowing, hoarseness, or tightening of the throat -stomach problems -suicidal thoughts, plans, or attempt; a previous suicide attempt by you or a family member -thyroid cancer or if someone in your family had thyroid cancer -an unusual or allergic reaction to liraglutide, other medicines, foods, dyes, or preservatives -pregnant or trying to get pregnant -breast-feeding How should I use this medicine? This medicine is for injection under the skin of your upper leg, stomach area, or upper arm. You will be taught how to prepare and give this medicine. Use exactly as directed. Take your medicine at regular intervals. Do not take it more often than directed. It is  important that you put your used needles and syringes in a special sharps container. Do not put them in a trash can. If you do not have a sharps container, call your pharmacist or healthcare provider to get one. A special MedGuide will be given to you by the pharmacist with each prescription and refill. Be sure to read this information carefully each time. Talk to your pediatrician regarding the use of this medicine in children. Special care may be needed. Overdosage: If you think you have taken too much of this medicine contact a poison control center or emergency room at once. NOTE: This medicine is only for you. Do not share this medicine with others. What if I miss a dose? If you miss a dose, take it as soon as you can. If it is almost time for your next dose, take only that dose. Do not take double or extra doses. If you miss your dose for 3 days or more, call your doctor or health care professional to talk about how to restart this medicine. What may interact with this medicine? -insulin and other medicines for diabetes This list may not describe all possible interactions. Give your health care provider a list of all the medicines, herbs, non-prescription drugs, or dietary supplements you use. Also tell them if you smoke, drink alcohol, or use illegal drugs. Some items may interact with your medicine. What should I watch for while using this medicine? Visit your doctor or health care professional for regular checks on your progress. This medicine is intended to be used in addition to a healthy diet and appropriate exercise. The best results are achieved this  way. Do not increase or in any way change your dose without consulting your doctor or health care professional. Drink plenty of fluids while taking this medicine. Check with your doctor or health care professional if you get an attack of severe diarrhea, nausea, and vomiting. The loss of too much body fluid can make it dangerous for you to take  this medicine. This medicine may affect blood sugar levels. If you have diabetes, check with your doctor or health care professional before you change your diet or the dose of your diabetic medicine. Patients and their families should watch out for worsening depression or thoughts of suicide. Also watch out for sudden changes in feelings such as feeling anxious, agitated, panicky, irritable, hostile, aggressive, impulsive, severely restless, overly excited and hyperactive, or not being able to sleep. If this happens, especially at the beginning of treatment or after a change in dose, call your health care professional. What side effects may I notice from receiving this medicine? Side effects that you should report to your doctor or health care professional as soon as possible: -allergic reactions like skin rash, itching or hives, swelling of the face, lips, or tongue -breathing problems -diarrhea that continues or is severe -lump or swelling on the neck -severe nausea -signs and symptoms of infection like fever or chills; cough; sore throat; pain or trouble passing urine -signs and symptoms of low blood sugar such as feeling anxious, confusion, dizziness, increased hunger, unusually weak or tired, sweating, shakiness, cold, irritable, headache, blurred vision, fast heartbeat, loss of consciousness -signs and symptoms of kidney injury like trouble passing urine or change in the amount of urine -trouble swallowing -unusual stomach upset or pain -vomiting Side effects that usually do not require medical attention (report to your doctor or health care professional if they continue or are bothersome): -constipation -decreased appetite -diarrhea -fatigue -headache -nausea -pain, redness, or irritation at site where injected -stomach upset -stuffy or runny nose This list may not describe all possible side effects. Call your doctor for medical advice about side effects. You may report side effects  to FDA at 1-800-FDA-1088. Where should I keep my medicine? Keep out of the reach of children. Store unopened pen in a refrigerator between 2 and 8 degrees C (36 and 46 degrees F). Do not freeze or use if the medicine has been frozen. Protect from light and excessive heat. After you first use the pen, it can be stored at room temperature between 15 and 30 degrees C (59 and 86 degrees F) or in a refrigerator. Throw away your used pen after 30 days or after the expiration date, whichever comes first. Do not store your pen with the needle attached. If the needle is left on, medicine may leak from the pen. NOTE: This sheet is a summary. It may not cover all possible information. If you have questions about this medicine, talk to your doctor, pharmacist, or health care provider.  2018 Elsevier/Gold Standard (2016-07-14 14:41:37)  Please be sure medication list is accurate. If a new problem present, please set up appointment sooner than planned today.

## 2018-05-02 NOTE — Assessment & Plan Note (Signed)
Gaining weight steadily, since her last visit she gained about 13 pounds (April/2019 she was 53 pounds). We discussed pharmacologic treatment options, she agrees with trying Saxenda.  If insurance does not cover Saxenda we can try Victoza. We discussed some side effects. Low impact regular physical activity recommended given the fact she has history of OA. We discussed benefits of wt loss as well as adverse effects of obesity. Consistency with healthy diet and physical activity recommended.

## 2018-05-02 NOTE — Progress Notes (Signed)
HPI:   [name] is a 62 y.o. female, who is here today for 6 months follow up.   She was last seen on 11/07/17.  Since her last OV she has seen pulmonologist and orthopedist. She also had her colonoscopy in Jan 15, 2018.  Her mother passed away a couple months ago.  B12 deficiency: She is on B12 1000 mcg IM montly. Last B12 injection 10/2017.   Lab Results  Component Value Date   VITAMINB12 89 (L) 10/25/2017   Lab Results  Component Value Date   WBC 5.3 11/07/2017   HGB 11.8 (L) 11/07/2017   HCT 36.4 11/07/2017   MCV 87.7 11/07/2017   PLT 363.0 11/07/2017    Chronic back pain ,OA, ,and depression. She is on Cymbalta 30 mg but taking medication prn, 1-2 times per week, when she has pain. She denies side effects.  Also on Voltaren 75 mg bid and Zanaflex 2 mg tid prn.   Hypertension:  Currently on Lisinopril-HCTZ 20-25 mg daily.  She is taking medications as instructed, no side effects reported. She is not checking BP's homr.  She has not noted unusual headache, visual changes, exertional chest pain, dyspnea,  focal weakness, or edema.   Lab Results  Component Value Date   CREATININE 0.72 10/27/2017   BUN 9 10/27/2017   NA 137 10/27/2017   K 4.3 10/27/2017   CL 108 10/27/2017   CO2 19 (L) 10/27/2017   Glucose 118 in 10/2017  OSA, she is not wearing CPAP, she "cannot sleep." Planning gon trying it again. + Fatigue.  -She is not exercising regularly,planning on doing so.  Trying to bake more and eat at home.   Review of Systems  Constitutional: Positive for fatigue. Negative for activity change, appetite change and fever.  HENT: Negative for mouth sores, nosebleeds and trouble swallowing.   Eyes: Negative for redness and visual disturbance.  Respiratory: Positive for apnea. Negative for cough, shortness of breath and wheezing.   Cardiovascular: Negative for chest pain, palpitations and leg swelling.  Gastrointestinal: Negative for abdominal  pain, nausea and vomiting.       Negative for changes in bowel habits.  Genitourinary: Negative for decreased urine volume, dysuria and hematuria.  Musculoskeletal: Positive for arthralgias, back pain and gait problem.  Neurological: Negative for seizures, syncope, weakness, numbness and headaches.  Psychiatric/Behavioral: Positive for sleep disturbance. Negative for confusion. The patient is nervous/anxious.      Current Outpatient Medications on File Prior to Visit  Medication Sig Dispense Refill  . albuterol (PROVENTIL HFA;VENTOLIN HFA) 108 (90 Base) MCG/ACT inhaler Inhale 1-2 puffs into the lungs every 6 (six) hours as needed for wheezing or shortness of breath. 3 Inhaler 4  . amoxicillin-clavulanate (AUGMENTIN) 875-125 MG tablet Take 1 tablet by mouth 2 (two) times daily.  0  . budesonide-formoterol (SYMBICORT) 160-4.5 MCG/ACT inhaler Inhale 2 puffs into the lungs 2 (two) times daily. 3 Inhaler 4  . diclofenac (VOLTAREN) 75 MG EC tablet Take 75 mg by mouth 2 (two) times daily.   1  . levocetirizine (XYZAL) 5 MG tablet Take 1 tablet (5 mg total) by mouth daily as needed for allergies.    [name] lisinopril-hydrochlorothiazide (PRINZIDE,ZESTORETIC) 20-25 MG tablet Take 1 tablet by mouth daily. 90 tablet 1  . montelukast (SINGULAIR) 10 MG tablet Take 1 tablet (10 mg total) by mouth at bedtime. (Patient taking differently: Take 10 mg by mouth daily as needed (Asthma). ) 90 tablet 1  . pantoprazole (PROTONIX)  40 MG tablet Take 1 tablet (40 mg total) by mouth daily as needed (for acid reflux).    [name] tiZANidine (ZANAFLEX) 2 MG tablet Take 1 tablet (2 mg total) by mouth every 8 (eight) hours as needed for muscle spasms. 45 tablet 1  . zolpidem (AMBIEN) 5 MG tablet 1 at bedtime if needed for sleep (Patient taking differently: Take 5 mg by mouth at bedtime as needed for sleep. ) 30 tablet 5   No current facility-administered medications on file prior to visit.      Past Medical History:  Diagnosis  Date  . Abnormal EKG 06/2003   History of inverted T waves V1-V3. Normal 2D echo (07/23/2003): LVEF 65%.  . Anemia    BL Hgb 11-12. Ferritin 14 - low normal (08/2007). Colonoscopy 2009 - external hemorrhoids (excellent prep). Last anemia panel (12/2010) - Iron  24, TIBC 269,  B12  316, Folate 11.9, Ferritin 73.  . Asthma   . Degenerative joint disease    BL knees (L>R), lumbar spine. Followed by Sports Medicine, Dr. Jennette Kettle.  . History of multiple pulmonary nodules    Incidental finding: CT Abd/ Pelvis (04/2010) - Several small lower lobe lung nodules, including one pure ground-glass pulmonary nodule measuring 8 mm in the left lower lobe.  Recommend follow-up chest CT (IV contrast preferred) in 6 months to document stability. //  CT Abd/ Pelvis (07/2010) -  3 mm RLL and  8 mm LLL nodule stable.  Other nodules unchanged, likely benign.  . Hyperlipidemia   . Hypertension   . Incarcerated ventral hernia 04/2010   Noted on CT Abd/ pelvis (04/2010). Patient now s/p ventral hernia repair by Dr. Gerrit Friends (12/2010)  . 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 Lower extremity edema    Chronic. 2D echo (2005) - EF 65%.  . Obesity    Allergies  Allergen Reactions  . Ketorolac Tromethamine Shortness Of Breath and Palpitations  . Atrovent [Ipratropium] Hives and Rash  . Latex Other (See Comments)    Powdered. Confirm type of reaction with patient.    Social History   Socioeconomic History  . Marital status: Married    Spouse name: Not on file  . Number of children: 3  . Years of education: Not on file  . Highest education level: Not on file  Occupational History  . Not on file  Social Needs  . Financial resource strain: Not on file  . Food insecurity:    Worry: Not on file    Inability: Not on file  . Transportation needs:    Medical: Not on file    Non-medical: Not on file  Tobacco Use  . Smoking status: Former Smoker    Packs/day: 0.50    Years: 20.00     Pack years: 10.00    Types: Cigarettes    Last attempt to quit: 09/10/1998    Years since quitting: 19.6  . Smokeless tobacco: Never Used  . Tobacco comment: 62 yo   Substance and Sexual Activity  . Alcohol use: No    Alcohol/week: 0.0 standard drinks  . Drug use: No  . Sexual activity: Yes    Partners: Male  Lifestyle  . Physical activity:    Days per week: Not on file    Minutes per session: Not on file  . Stress: Not on file  Relationships  . Social connections:    Talks on phone: Not on file    Gets together:  Not on file    Attends religious service: Not on file    Active member of club or organization: Not on file    Attends meetings of clubs or organizations: Not on file    Relationship status: Not on file  Other Topics Concern  . Not on file  Social History Narrative   Patient is married and has 3 children   No alcohol tobacco or drug use reported    Vitals:   05/02/18 1035  BP: 140/80  Pulse: 63  Resp: 16  Temp: 98.4 F (36.9 C)  SpO2: 98%   Body mass index is 61.16 kg/m.  Wt Readings from Last 3 Encounters:  05/02/18 (!) 367 lb 8 oz (166.7 kg)  02/14/18 (!) 362 lb 9.6 oz (164.5 kg)  12/18/17 (!) 361 lb (163.7 kg)    Physical Exam  Nursing note and vitals reviewed. Constitutional: She is oriented to person, place, and time. She appears well-developed. No distress.  HENT:  Head: Normocephalic and atraumatic.  Mouth/Throat: Oropharynx is clear and moist and mucous membranes are normal.  Eyes: Pupils are equal, round, and reactive to light. Conjunctivae are normal.  Cardiovascular: Normal rate and regular rhythm.  No murmur heard. Pulses:      Dorsalis pedis pulses are 2+ on the right side, and 2+ on the left side.  Respiratory: Effort normal and breath sounds normal. No respiratory distress.  GI: Soft. She exhibits no mass. There is no tenderness.  Musculoskeletal: She exhibits edema (Trace edema LE,bilateral.).  Lymphadenopathy:    She has no  cervical adenopathy.  Neurological: She is alert and oriented to person, place, and time. She has normal strength. No cranial nerve deficit. Gait normal.  Skin: Skin is warm. No rash noted. No erythema.  Psychiatric: She has a normal mood and affect.  Well groomed, good eye contact.      ASSESSMENT AND PLAN:   Ms. SHIRA BOBST was seen today for 5 months follow-up.  Orders Placed This Encounter  Procedures  . Flu Vaccine QUAD 36+ mos IM  . Comprehensive metabolic panel  . Hemoglobin A1c  . Vitamin B12   Lab Results  Component Value Date   VITAMINB12 >1525 (H) 05/02/2018   Lab Results  Component Value Date   ALT 10 05/02/2018   AST 14 05/02/2018   ALKPHOS 99 05/02/2018   BILITOT 0.4 05/02/2018   Lab Results  Component Value Date   CREATININE 0.71 05/02/2018   BUN 11 05/02/2018   NA 141 05/02/2018   K 4.0 05/02/2018   CL 107 05/02/2018   CO2 27 05/02/2018    Chronic low back pain, unspecified back pain laterality, unspecified whether sciatica present Cymbalta increased from 30 mg to 60 mg and recommend taking it daily. Continue Zanaflex. Side effects discussed. Wt loss also will help.  Hyperglycemia Healthy life style for primary prevention recommended. Further recommendations will be given according to HgA1C results.  Essential hypertension BP slightly elevated. For now no changes in current management. Recommend monitoring BP at home. Low-salt diet. Eye exam is current. Follow-up in 3 to 4 months.  Morbid obesity with BMI of 50.0-59.9, adult (HCC) Gaining weight steadily, since her last visit she gained about 13 pounds (April/2019 she was 53 pounds). We discussed pharmacologic treatment options, she agrees with trying Saxenda.  If insurance does not cover Saxenda we can try Victoza. We discussed some side effects. Low impact regular physical activity recommended given the fact she has history of OA. We  discussed benefits of wt loss as well as adverse  effects of obesity. Consistency with healthy diet and physical activity recommended.    B12 deficiency After verbal consent she received B12 1000 mcg IM here in the office. Further recommendation will be given according to lab results.  OSA on CPAP Strongly recommend compliance with CPAP. Adverse effects of OSA discussed. Continue following with Dr Maple Hudson.    Nevin Kozuch G. [name], MD  [facility]. [place] office.

## 2018-05-02 NOTE — Telephone Encounter (Signed)
Copied from CRM [id number]. Topic: Quick Communication - See Telephone Encounter >> May 02, 2018  4:14 PM [name] wrote: CRM for notification. See Telephone encounter for: 05/02/18.  Patient states the pharmacy is needing a prior authorization on her Liraglutide -Weight Management (SAXENDA) 18 MG/3ML SOPN. Please update patient as you can.

## 2018-05-02 NOTE — Assessment & Plan Note (Signed)
BP slightly elevated. For now no changes in current management. Recommend monitoring BP at home. Low-salt diet. Eye exam is current. Follow-up in 3 to 4 months.

## 2018-05-04 NOTE — Telephone Encounter (Signed)
Prior auth for [place] sent to [email or web address]-key [id number].

## 2018-05-05 ENCOUNTER — Encounter: Payer: Self-pay | Admitting: Family Medicine

## 2018-05-09 ENCOUNTER — Ambulatory Visit (INDEPENDENT_AMBULATORY_CARE_PROVIDER_SITE_OTHER): Payer: Medicare Other | Admitting: Family Medicine

## 2018-05-09 ENCOUNTER — Encounter (INDEPENDENT_AMBULATORY_CARE_PROVIDER_SITE_OTHER): Payer: Self-pay | Admitting: Family Medicine

## 2018-05-09 VITALS — BP 128/69 | HR 61 | Temp 98.1°F | Ht 65.0 in | Wt 355.0 lb

## 2018-05-09 DIAGNOSIS — I10 Essential (primary) hypertension: Secondary | ICD-10-CM

## 2018-05-09 DIAGNOSIS — Z6841 Body Mass Index (BMI) 40.0 and over, adult: Secondary | ICD-10-CM

## 2018-05-09 DIAGNOSIS — R739 Hyperglycemia, unspecified: Secondary | ICD-10-CM

## 2018-05-09 DIAGNOSIS — E639 Nutritional deficiency, unspecified: Secondary | ICD-10-CM | POA: Diagnosis not present

## 2018-05-09 DIAGNOSIS — H01024 Squamous blepharitis left upper eyelid: Secondary | ICD-10-CM | POA: Diagnosis not present

## 2018-05-09 DIAGNOSIS — H01022 Squamous blepharitis right lower eyelid: Secondary | ICD-10-CM | POA: Diagnosis not present

## 2018-05-09 DIAGNOSIS — H01021 Squamous blepharitis right upper eyelid: Secondary | ICD-10-CM | POA: Diagnosis not present

## 2018-05-09 DIAGNOSIS — H04123 Dry eye syndrome of bilateral lacrimal glands: Secondary | ICD-10-CM | POA: Diagnosis not present

## 2018-05-09 DIAGNOSIS — E7849 Other hyperlipidemia: Secondary | ICD-10-CM | POA: Diagnosis not present

## 2018-05-09 DIAGNOSIS — R5383 Other fatigue: Secondary | ICD-10-CM | POA: Diagnosis not present

## 2018-05-09 DIAGNOSIS — H35413 Lattice degeneration of retina, bilateral: Secondary | ICD-10-CM | POA: Diagnosis not present

## 2018-05-09 DIAGNOSIS — Z1331 Encounter for screening for depression: Secondary | ICD-10-CM

## 2018-05-09 DIAGNOSIS — R0602 Shortness of breath: Secondary | ICD-10-CM

## 2018-05-09 DIAGNOSIS — Z0289 Encounter for other administrative examinations: Secondary | ICD-10-CM

## 2018-05-09 NOTE — Progress Notes (Signed)
Office: [phone]  /  Fax: [phone]   Dear Dr. [name],   Thank you for referring [name] to our clinic. The following note includes my evaluation and treatment recommendations.  HPI:   Chief Complaint: OBESITY    [name] has been referred by Holly G. [name], MD for consultation regarding her obesity and obesity related comorbidities.    [name] (MR# [id number]) is a 62 y.o. female who presents on 05/09/2018 for obesity evaluation and treatment. Current BMI is Body mass index is 59.08 kg/m.[name] [name] has been struggling with her weight for many years and has been unsuccessful in either losing weight, maintaining weight loss, or reaching her healthy weight goal. [name] states she doesn't eat much and that she usually skips breakfast.     [name] attended our information session and states she is currently in the action stage of change and ready to dedicate time achieving and maintaining a healthier weight. Truth is interested in becoming our patient and working on intensive lifestyle modifications including (but not limited to) diet, exercise and weight loss.    [name] states her family eats meals together she thinks her family will eat healthier with her her desired weight loss is 201 lbs she started gaining weight after her last child in 1998 her heaviest weight ever was 361 lbs. she is a picky eater and doesn't like to eat healthier foods  she has significant food cravings issues  she skips meals frequently she is trying to eat vegetarian or vegan sometimes she is frequently drinking liquids with calories she has problems with excessive hunger at times she struggles with emotional eating    Fatigue [name] feels her energy is lower than it should be. This has worsened with weight gain and has not worsened recently. [name] admits to daytime somnolence and admits to waking up still tired. Patient has a history of obstructive sleep apnea, which may contribute  to fatigue. Patent has a history of symptoms of daytime fatigue, morning fatigue, morning headache and hypertension. Patient generally gets 4 to 6 hours of sleep per night, and states they have somewhat restful sleep. Snoring is present. Apneic episodes are present. Epworth Sleepiness Score is 7  Dyspnea on exertion [name] notes increasing shortness of breath with exercising and seems to be worsening over time with weight gain. She notes getting out of breath sooner with activity than she used to. This has not gotten worse recently. [name] denies orthopnea.  Hypertension [name] is a 62 y.o. female with hypertension. [name] denies chest pain or headache. She is attempting to work on weight loss to help improve her blood pressure with the goal of decreasing her risk of heart attack and stroke. [name] blood pressure is controlled on Lisinopril-HCTZ.  Hyperlipidemia [name] has hyperlipidemia and she is not on a statin. She wants to control her cholesterol levels with intensive lifestyle modification including a low saturated fat diet, exercise and weight loss. She denies any chest pain today.  Nutritional Deficiencies [name] has a history of multiple vitamin deficiencies and she is eating mostly simple carbohydrates.  Vitamin D deficiency [name] has a diagnosis of vitamin D deficiency. [name] is not currently taking vit D and she admits to fatigue but she denies nausea, vomiting or muscle weakness.  Hyperglycemia [name] has an elevated Hgb A1c of 5.7. She admits to increased polyphagia in the evening.  Positive Depression Screen [name] had a PHQ-9 score of 12. She has guilt and self hate  regarding her weight and she felt judged about her weight while growing up. She eats for emotional reasons, especially while bored and she eats larger than normal portions at least 2 to 3 times per week. She shows no sign of suicidal or homicidal ideations.  [name]'s Food and Mood (modified  PHQ-9) score was  Depression screen PHQ 2/9 05/09/2018  Decreased Interest 3  Down, Depressed, Hopeless 1  PHQ - 2 Score 4  Altered sleeping 2  Tired, decreased energy 3  Change in appetite 2  Feeling bad or failure about yourself  0  Trouble concentrating 1  Moving slowly or fidgety/restless 0  Suicidal thoughts 0  PHQ-9 Score 12  Difficult doing work/chores Somewhat difficult    ALLERGIES: Allergies  Allergen Reactions  . Ketorolac Tromethamine Shortness Of Breath and Palpitations  . Atrovent [Ipratropium] Hives and Rash  . Latex Other (See Comments)    Powdered. Confirm type of reaction with patient.    MEDICATIONS: Current Outpatient Medications on File Prior to Visit  Medication Sig Dispense Refill  . albuterol (PROVENTIL HFA;VENTOLIN HFA) 108 (90 Base) MCG/ACT inhaler Inhale 1-2 puffs into the lungs every 6 (six) hours as needed for wheezing or shortness of breath. 3 Inhaler 4  . budesonide-formoterol (SYMBICORT) 160-4.5 MCG/ACT inhaler Inhale 2 puffs into the lungs 2 (two) times daily. 3 Inhaler 4  . diclofenac (VOLTAREN) 75 MG EC tablet Take 75 mg by mouth 2 (two) times daily.   1  . DULoxetine (CYMBALTA) 60 MG capsule Take 1 capsule (60 mg total) by mouth daily. 90 capsule 2  . levocetirizine (XYZAL) 5 MG tablet Take 1 tablet (5 mg total) by mouth daily as needed for allergies.    [name] lisinopril-hydrochlorothiazide (PRINZIDE,ZESTORETIC) 20-25 MG tablet Take 1 tablet by mouth daily. 90 tablet 1  . montelukast (SINGULAIR) 10 MG tablet Take 1 tablet (10 mg total) by mouth at bedtime. (Patient taking differently: Take 10 mg by mouth daily as needed (Asthma). ) 90 tablet 1  . pantoprazole (PROTONIX) 40 MG tablet Take 1 tablet (40 mg total) by mouth daily as needed (for acid reflux).    [name] tiZANidine (ZANAFLEX) 2 MG tablet Take 1 tablet (2 mg total) by mouth every 8 (eight) hours as needed for muscle spasms. 45 tablet 1  . zolpidem (AMBIEN) 5 MG tablet 1 at bedtime if needed for  sleep (Patient taking differently: Take 5 mg by mouth at bedtime as needed for sleep. ) 30 tablet 5   No current facility-administered medications on file prior to visit.     PAST MEDICAL HISTORY: Past Medical History:  Diagnosis Date  . Abnormal EKG 06/2003   History of inverted T waves V1-V3. Normal 2D echo (07/23/2003): LVEF 65%.  . Anemia    BL Hgb 11-12. Ferritin 14 - low normal (08/2007). Colonoscopy 2009 - external hemorrhoids (excellent prep). Last anemia panel (12/2010) - Iron  24, TIBC 269,  B12  316, Folate 11.9, Ferritin 73.  . Asthma   . B12 deficiency   . Back pain   . Chest pain   . Degenerative joint disease    BL knees (L>R), lumbar spine. Followed by Sports Medicine, Dr. Jennette Kettle.  . Dyspnea   . History of multiple pulmonary nodules    Incidental finding: CT Abd/ Pelvis (04/2010) - Several small lower lobe lung nodules, including one pure ground-glass pulmonary nodule measuring 8 mm in the left lower lobe.  Recommend follow-up chest CT (IV contrast preferred) in 6 months to document  stability. //  CT Abd/ Pelvis (07/2010) -  3 mm RLL and  8 mm LLL nodule stable.  Other nodules unchanged, likely benign.  . Hyperlipidemia   . Hypertension   . Incarcerated ventral hernia 04/2010   Noted on CT Abd/ pelvis (04/2010). Patient now s/p ventral hernia repair by Dr. Gerrit Friends (12/2010)  . Insomnia   . 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 Left hip pain   . Left ventricular diastolic dysfunction   . Leg edema   . Lower extremity edema    Chronic. 2D echo (2005) - EF 65%.  . Obesity   . OSA (obstructive sleep apnea)   . Palpitations   . Positive D dimer   . Right knee pain     PAST SURGICAL HISTORY: Past Surgical History:  Procedure Laterality Date  . COLONOSCOPY  2009  . COLONOSCOPY WITH PROPOFOL N/A 12/18/2017   Procedure: COLONOSCOPY WITH PROPOFOL;  Surgeon: Iva Boop, MD;  Location: [facility];  Service: Endoscopy;  Laterality: N/A;   . FOOT SURGERY  2004    left  . INCISION AND DRAINAGE ABSCESS ANAL  07/2008   I&D and debridgement of anorectal abscess  . INCISIONAL HERNIA REPAIR  12/2010   Repair of incarcerated ventral incisional hernia with Ethicon mesh patch - performed by Dr. Gerrit Friends.   . INGUINAL HERNIA REPAIR    . JOINT REPLACEMENT Left 2013   left knee  . TOTAL KNEE ARTHROPLASTY  06/16/2011   Left TOTAL KNEE ARTHROPLASTY;  Surgeon: Kennieth Rad;  Location: [facility] OR;  Service: Orthopedics;  Laterality: Left;  left total knee arthroplasty  . TUBAL LIGATION      SOCIAL HISTORY: Social History   Tobacco Use  . Smoking status: Former Smoker    Packs/day: 0.50    Years: 20.00    Pack years: 10.00    Types: Cigarettes    Last attempt to quit: 09/10/1998    Years since quitting: 19.6  . Smokeless tobacco: Never Used  . Tobacco comment: 62 yo   Substance Use Topics  . Alcohol use: No    Alcohol/week: 0.0 standard drinks  . Drug use: No    FAMILY HISTORY: Family History  Problem Relation Age of Onset  . Diabetes Mother   . Hypertension Mother   . Stroke Mother   . Hyperlipidemia Mother   . Heart disease Mother   . Sudden death Mother   . Kidney disease Mother   . Depression Mother   . Dementia Father   . Heart disease Father   . Hyperlipidemia Father   . Sudden death Father   . Depression Father   . Hypertension Sister   . Diabetes Brother   . Hypertension Brother   . Rashes / Skin problems Maternal Grandfather   . Heart attack Neg Hx     ROS: Review of Systems  Constitutional: Positive for malaise/fatigue.       + Fever/Chills  HENT: Positive for congestion (nasal stuffiness) and sinus pain.        + Dentures + Dry Mouth  Eyes: Positive for redness.       + Wear Glasses (reading) + Flashes of Light  Respiratory: Positive for shortness of breath and wheezing.   Cardiovascular: Positive for chest pain and palpitations. Negative for orthopnea.       + Shortness of Breath with  Activity + Sudden Awakening from Sleep with Shortness of Breath + Calf/Leg Pain with Walking +  Leg Cramping + Very Cold Fett or Hands  Gastrointestinal: Positive for heartburn and nausea. Negative for vomiting.  Musculoskeletal: Positive for back pain.       + Muscle or Joint Pain + Muscle Stiffness + Red or Swollen Joints Negative for muscle weakness  Skin: Positive for itching.  Neurological: Positive for weakness and headaches.  Endo/Heme/Allergies:       + Heat or Cold Intolerance + polyphagia  Psychiatric/Behavioral: The patient has insomnia.     PHYSICAL EXAM: Blood pressure 128/69, pulse 61, temperature 98.1 F (36.7 C), temperature source Oral, height 5\' 5"  (1.651 m), weight (!) 355 lb (161 kg), last menstrual period 09/09/2009, SpO2 97 %. Body mass index is 59.08 kg/m. Physical Exam  Constitutional: She is oriented to person, place, and time. She appears well-developed and well-nourished.  HENT:  Head: Normocephalic and atraumatic.  Nose: Nose normal.  Eyes: EOM are normal. No scleral icterus.  Neck: Normal range of motion. Neck supple. No thyromegaly present.  Cardiovascular: Normal rate and regular rhythm.  Pulmonary/Chest: Effort normal. No respiratory distress.  Abdominal: Soft. There is no tenderness.  + Obesity  Musculoskeletal: Normal range of motion.  Range of Motion normal in all 4 extremities  Neurological: She is alert and oriented to person, place, and time. Coordination normal.  Skin: Skin is warm and dry.  Psychiatric: She has a normal mood and affect. She expresses no homicidal and no suicidal ideation.  Vitals reviewed.   RECENT LABS AND TESTS: BMET    Component Value Date/Time   NA 141 05/02/2018 1124   K 4.0 05/02/2018 1124   CL 107 05/02/2018 1124   CO2 27 05/02/2018 1124   GLUCOSE 92 05/02/2018 1124   BUN 11 05/02/2018 1124   CREATININE 0.71 05/02/2018 1124   CREATININE 0.74 09/15/2017 1519   CALCIUM 9.4 05/02/2018 1124   GFRNONAA  >60 10/27/2017 1010   GFRNONAA 81 03/21/2013 1530   GFRAA >60 10/27/2017 1010   GFRAA >89 03/21/2013 1530   Lab Results  Component Value Date   HGBA1C 5.7 05/02/2018   No results found for: INSULIN CBC    Component Value Date/Time   WBC 5.3 11/07/2017 1635   RBC 4.16 11/07/2017 1635   HGB 11.8 (L) 11/07/2017 1635   HCT 36.4 11/07/2017 1635   PLT 363.0 11/07/2017 1635   MCV 87.7 11/07/2017 1635   MCH 27.7 10/25/2017 0207   MCHC 32.4 11/07/2017 1635   RDW 14.9 11/07/2017 1635   LYMPHSABS 2.1 11/07/2017 1635   MONOABS 0.4 11/07/2017 1635   EOSABS 0.2 11/07/2017 1635   BASOSABS 0.1 11/07/2017 1635   Iron/TIBC/Ferritin/ %Sat    Component Value Date/Time   IRON 47 10/25/2017 0859   TIBC 330 10/25/2017 0859   FERRITIN 15 10/25/2017 0859   IRONPCTSAT 14 10/25/2017 0859   IRONPCTSAT 17 (L) 11/17/2011 1656   Lipid Panel     Component Value Date/Time   CHOL 189 10/25/2017 1208   TRIG 50 10/25/2017 1208   HDL 41 10/25/2017 1208   CHOLHDL 4.6 10/25/2017 1208   VLDL 10 10/25/2017 1208   LDLCALC 138 (H) 10/25/2017 1208   Hepatic Function Panel     Component Value Date/Time   PROT 6.8 05/02/2018 1124   ALBUMIN 4.0 05/02/2018 1124   AST 14 05/02/2018 1124   ALT 10 05/02/2018 1124   ALKPHOS 99 05/02/2018 1124   BILITOT 0.4 05/02/2018 1124   BILIDIR 0.1 05/03/2010 0625   IBILI 0.3 05/03/2010 1610  Component Value Date/Time   TSH 3.440 10/25/2017 0859   TSH 1.307 07/16/2014 1202   TSH 1.821 11/02/2012 1235   TSH 2.105 02/23/2011 1236    ECG  shows NSR with a rate of 71 BPM INDIRECT CALORIMETER done today shows a VO2 of 243 and a REE of 1690.  Her calculated basal metabolic rate is 0981 thus her basal metabolic rate is worse than expected.    ASSESSMENT AND PLAN: Other fatigue - Plan: EKG 12-Lead, CBC With Differential, T3, T4, free, TSH  Shortness of breath on exertion - Plan: Lipid Panel With LDL/HDL Ratio  Essential hypertension  Other  hyperlipidemia  Hyperglycemia - Plan: Comprehensive metabolic panel, Insulin, random  Nutritional deficiency - Plan: VITAMIN D 25 Hydroxy (Vit-D Deficiency, Fractures), Folate  Positive depression screening  Class 3 severe obesity with serious comorbidity and body mass index (BMI) of 50.0 to 59.9 in adult, unspecified obesity type (HCC)  PLAN: Fatigue Linea was informed that her fatigue may be related to obesity, depression or many other causes. Labs will be ordered, and in the meanwhile Mackenzie has agreed to work on diet, exercise and weight loss to help with fatigue. Proper sleep hygiene was discussed including the need for 7-8 hours of quality sleep each night. A sleep study was not ordered based on symptoms and Epworth score.  Dyspnea on exertion Kellee's shortness of breath appears to be obesity related and exercise induced. She has agreed to work on weight loss and gradually increase exercise to treat her exercise induced shortness of breath. If Addelyn follows our instructions and loses weight without improvement of her shortness of breath, we will plan to refer to pulmonology. We will monitor this condition regularly. Aidaly agrees to this plan.  Hypertension We discussed sodium restriction, working on healthy weight loss, and a regular exercise program as the means to achieve improved blood pressure control. Priscila agreed with this plan and agreed to follow up as directed. We will check labs and will continue to monitor her blood pressure as well as her progress with the above lifestyle modifications. She will continue her medications as prescribed and will watch for signs of hypotension as she continues her lifestyle modifications.  Hyperlipidemia Kambrie was informed of the American Heart Association Guidelines emphasizing intensive lifestyle modifications as the first line treatment for hyperlipidemia. We discussed many lifestyle modifications today in depth, and Yanci will start to  work on decreasing saturated fats such as fatty red meat, butter and many fried foods. She will also increase vegetables and lean protein in her diet and start to work on exercise and weight loss efforts. We will check labs and follow.  Nutritional Deficiencies Labs will be obtained and results will be discussed with Lawanna Kobus in 2 weeks at her follow up visit. Lunden was started on a lower simple carbohydrate diet and will work on weight loss efforts.  Vitamin D Deficiency Taygan was informed that low vitamin D levels contributes to fatigue and are associated with obesity, breast, and colon cancer. We will check vitamin D level today and results will be discussed with [name] at her follow up visit in 2 weeks. She will follow up for routine testing of vitamin D, at least 2-3 times per year.   Hyperglycemia Fasting labs will be obtained and the results will be discussed with [name] in 2 weeks at her follow up visit. In the meanwhile Kamera was started on a lower simple carbohydrate diet and she will work on weight loss efforts.  Positive Depression Screen Emony had a moderately positive depression screening. Depression is commonly associated with obesity and often results in emotional eating behaviors. We will monitor this closely and work on CBT to help improve the non-hunger eating patterns. Keesha will continue Cymbalta as prescribed and follow up with our clinic in 2 weeks.  Obesity Azaliyah is currently in the action stage of change and her goal is to continue with weight loss efforts. I recommend Kristain begin the structured treatment plan as follows:  She has agreed to follow the Category 2 plan Casidy has been instructed to eventually work up to a goal of 150 minutes of combined cardio and strengthening exercise per week for weight loss and overall health benefits. We discussed the following Behavioral Modification Strategies today: increasing lean protein intake, decreasing simple carbohydrates   and work on meal planning and easy cooking plans   She was informed of the importance of frequent follow up visits to maximize her success with intensive lifestyle modifications for her multiple health conditions. She was informed we would discuss her lab results at her next visit unless there is a critical issue that needs to be addressed sooner. Akeia agreed to keep her next visit at the agreed upon time to discuss these results.    OBESITY BEHAVIORAL INTERVENTION VISIT  Today's visit was # 1   Starting weight: 355 lbs Starting date: 05/09/18 Today's weight : 355 lbs Today's date: 05/09/2018 Total lbs lost to date: 0 At least 15 minutes were spent on discussing the following behavioral intervention visit.   ASK: We discussed the diagnosis of obesity with [name] today and Alazay agreed to give [place] permission to discuss obesity behavioral modification therapy today.  ASSESS: Lizvet has the diagnosis of obesity and her BMI today is 59.08 Sonali is in the action stage of change   ADVISE: Christyne was educated on the multiple health risks of obesity as well as the benefit of weight loss to improve her health. She was advised of the need for long term treatment and the importance of lifestyle modifications to improve her current health and to decrease her risk of future health problems.  AGREE: Multiple dietary modification options and treatment options were discussed and  Andretta agreed to follow the recommendations documented in the above note.  ARRANGE: Jocie was educated on the importance of frequent visits to treat obesity as outlined per CMS and USPSTF guidelines and agreed to schedule her next follow up appointment today.  I, [place] Crane, am acting as transcriptionist for Quillian Quince, MD  I have reviewed the above documentation for accuracy and completeness, and I agree with the above. -Quillian Quince, MD

## 2018-05-10 DIAGNOSIS — H21342 Primary cyst of pars plana, left eye: Secondary | ICD-10-CM | POA: Diagnosis not present

## 2018-05-10 DIAGNOSIS — H33311 Horseshoe tear of retina without detachment, right eye: Secondary | ICD-10-CM | POA: Diagnosis not present

## 2018-05-10 DIAGNOSIS — H35412 Lattice degeneration of retina, left eye: Secondary | ICD-10-CM | POA: Diagnosis not present

## 2018-05-10 DIAGNOSIS — H35411 Lattice degeneration of retina, right eye: Secondary | ICD-10-CM | POA: Diagnosis not present

## 2018-05-10 DIAGNOSIS — H43393 Other vitreous opacities, bilateral: Secondary | ICD-10-CM | POA: Diagnosis not present

## 2018-05-10 LAB — CBC WITH DIFFERENTIAL
Basophils Absolute: 0.1 10*3/uL (ref 0.0–0.2)
Basos: 1 %
EOS (ABSOLUTE): 0.3 10*3/uL (ref 0.0–0.4)
Eos: 6 %
Hematocrit: 38.1 % (ref 34.0–46.6)
Hemoglobin: 11.9 g/dL (ref 11.1–15.9)
Immature Grans (Abs): 0 10*3/uL (ref 0.0–0.1)
Immature Granulocytes: 0 %
Lymphocytes Absolute: 1.7 10*3/uL (ref 0.7–3.1)
Lymphs: 34 %
MCH: 27.2 pg (ref 26.6–33.0)
MCHC: 31.2 g/dL — ABNORMAL LOW (ref 31.5–35.7)
MCV: 87 fL (ref 79–97)
Monocytes Absolute: 0.4 10*3/uL (ref 0.1–0.9)
Monocytes: 7 %
Neutrophils Absolute: 2.7 10*3/uL (ref 1.4–7.0)
Neutrophils: 52 %
RBC: 4.38 x10E6/uL (ref 3.77–5.28)
RDW: 13.6 % (ref 12.3–15.4)
WBC: 5.1 10*3/uL (ref 3.4–10.8)

## 2018-05-10 LAB — COMPREHENSIVE METABOLIC PANEL
ALT: 10 IU/L (ref 0–32)
AST: 13 IU/L (ref 0–40)
Albumin/Globulin Ratio: 1.6 (ref 1.2–2.2)
Albumin: 4.3 g/dL (ref 3.6–4.8)
Alkaline Phosphatase: 117 IU/L (ref 39–117)
BUN/Creatinine Ratio: 16 (ref 12–28)
BUN: 13 mg/dL (ref 8–27)
Bilirubin Total: 0.3 mg/dL (ref 0.0–1.2)
CO2: 23 mmol/L (ref 20–29)
Calcium: 10.1 mg/dL (ref 8.7–10.3)
Chloride: 104 mmol/L (ref 96–106)
Creatinine, Ser: 0.8 mg/dL (ref 0.57–1.00)
GFR calc Af Amer: 92 mL/min/{1.73_m2} (ref >59)
GFR calc non Af Amer: 80 mL/min/{1.73_m2} (ref >59)
Globulin, Total: 2.7 g/dL (ref 1.5–4.5)
Glucose: 99 mg/dL (ref 65–99)
Potassium: 4.6 mmol/L (ref 3.5–5.2)
Sodium: 141 mmol/L (ref 134–144)
Total Protein: 7 g/dL (ref 6.0–8.5)

## 2018-05-10 LAB — LIPID PANEL WITH LDL/HDL RATIO
Cholesterol, Total: 233 mg/dL — ABNORMAL HIGH (ref 100–199)
HDL: 47 mg/dL (ref >39)
LDL Calculated: 167 mg/dL — ABNORMAL HIGH (ref 0–99)
LDl/HDL Ratio: 3.6 ratio — ABNORMAL HIGH (ref 0.0–3.2)
Triglycerides: 95 mg/dL (ref 0–149)
VLDL Cholesterol Cal: 19 mg/dL (ref 5–40)

## 2018-05-10 LAB — TSH: TSH: 1.9 u[IU]/mL (ref 0.450–4.500)

## 2018-05-10 LAB — INSULIN, RANDOM: INSULIN: 12.2 u[IU]/mL (ref 2.6–24.9)

## 2018-05-10 LAB — T3: T3, Total: 131 ng/dL (ref 71–180)

## 2018-05-10 LAB — FOLATE: Folate: 3.8 ng/mL (ref >3.0)

## 2018-05-10 LAB — VITAMIN D 25 HYDROXY (VIT D DEFICIENCY, FRACTURES): Vit D, 25-Hydroxy: 10.6 ng/mL — ABNORMAL LOW (ref 30.0–100.0)

## 2018-05-10 LAB — T4, FREE: Free T4: 1.2 ng/dL (ref 0.82–1.77)

## 2018-05-11 NOTE — Telephone Encounter (Signed)
Message sent to Dr. Jordan for review. 

## 2018-05-11 NOTE — Telephone Encounter (Signed)
Fax received from [facility] stating the request was denied and this was given to Dr Jordan's asst. 

## 2018-05-15 DIAGNOSIS — R269 Unspecified abnormalities of gait and mobility: Secondary | ICD-10-CM | POA: Diagnosis not present

## 2018-05-15 DIAGNOSIS — I10 Essential (primary) hypertension: Secondary | ICD-10-CM | POA: Diagnosis not present

## 2018-05-24 ENCOUNTER — Ambulatory Visit (INDEPENDENT_AMBULATORY_CARE_PROVIDER_SITE_OTHER): Payer: Medicare Other | Admitting: Family Medicine

## 2018-05-24 VITALS — BP 148/82 | HR 80 | Temp 98.3°F | Ht 65.0 in | Wt 353.0 lb

## 2018-05-24 DIAGNOSIS — I10 Essential (primary) hypertension: Secondary | ICD-10-CM

## 2018-05-24 DIAGNOSIS — Z6841 Body Mass Index (BMI) 40.0 and over, adult: Secondary | ICD-10-CM | POA: Diagnosis not present

## 2018-05-24 DIAGNOSIS — R7303 Prediabetes: Secondary | ICD-10-CM

## 2018-05-24 DIAGNOSIS — E559 Vitamin D deficiency, unspecified: Secondary | ICD-10-CM

## 2018-05-24 DIAGNOSIS — E7849 Other hyperlipidemia: Secondary | ICD-10-CM

## 2018-05-24 MED ORDER — VITAMIN D (ERGOCALCIFEROL) 1.25 MG (50000 UNIT) PO CAPS: 50000.0000 [IU] | ORAL_CAPSULE | ORAL | 0 refills | Status: DC

## 2018-05-25 DIAGNOSIS — H33311 Horseshoe tear of retina without detachment, right eye: Secondary | ICD-10-CM | POA: Diagnosis not present

## 2018-05-29 NOTE — Progress Notes (Signed)
Office: [phone]  /  Fax: [phone]   HPI:   Chief Complaint: OBESITY [name] is here to discuss her progress with her obesity treatment plan. She is on the Category 2 plan and is following her eating plan approximately 70-75 % of the time. She states she is doing more walking lately. [name] did well with weight loss on her Category 2 plan. She really missed her mayo and added some extra calories, but was still mindful.  Her weight is (!) 353 lb (160.1 kg) today and has had a weight loss of 2 pounds over a period of 2 weeks since her last visit. She has lost 2 lbs since starting treatment with us.  Hyperlipidemia (Mixed) [name] has hyperlipidemia, she is not on statin and has been trying to control her cholesterol levels with intensive lifestyle modification including a low saturated fat diet, exercise and weight loss. She denies any chest pain, claudication or myalgias.  Vitamin D Deficiency [name] has a diagnosis of vitamin D deficiency. She is not on Vit D, she notes fatigue and denies nausea, vomiting or muscle weakness.  Pre-Diabetes [name] has a diagnosis of pre-diabetes based on her elevated Hgb A1c at 5.7 and was informed this puts her at greater risk of developing diabetes. She is not on metformin, she is doing well with diet and continues to work on exercise to decrease risk of diabetes. She notes decreased polyphagia and denies hypoglycemia.  Hypertension [name] is a 62 y.o. female with hypertension. Ma's blood pressure is elevated today, she states she has increase stress today. She denies chest pain or headaches. She is working weight loss to help control her blood pressure with the goal of decreasing her risk of heart attack and stroke. Holley's blood pressure is not currently controlled.  ALLERGIES: Allergies  Allergen Reactions  . Ketorolac Tromethamine Shortness Of Breath and Palpitations  . Atrovent [Ipratropium] Hives and Rash  . Latex Other (See  Comments)    Powdered. Confirm type of reaction with patient.    MEDICATIONS: Current Outpatient Medications on File Prior to Visit  Medication Sig Dispense Refill  . albuterol (PROVENTIL HFA;VENTOLIN HFA) 108 (90 Base) MCG/ACT inhaler Inhale 1-2 puffs into the lungs every 6 (six) hours as needed for wheezing or shortness of breath. 3 Inhaler 4  . budesonide-formoterol (SYMBICORT) 160-4.5 MCG/ACT inhaler Inhale 2 puffs into the lungs 2 (two) times daily. 3 Inhaler 4  . diclofenac (VOLTAREN) 75 MG EC tablet Take 75 mg by mouth 2 (two) times daily.   1  . DULoxetine (CYMBALTA) 60 MG capsule Take 1 capsule (60 mg total) by mouth daily. 90 capsule 2  . levocetirizine (XYZAL) 5 MG tablet Take 1 tablet (5 mg total) by mouth daily as needed for allergies.    [name]. lisinopril-hydrochlorothiazide (PRINZIDE,ZESTORETIC) 20-25 MG tablet Take 1 tablet by mouth daily. 90 tablet 1  . montelukast (SINGULAIR) 10 MG tablet Take 1 tablet (10 mg total) by mouth at bedtime. (Patient taking differently: Take 10 mg by mouth daily as needed (Asthma). ) 90 tablet 1  . pantoprazole (PROTONIX) 40 MG tablet Take 1 tablet (40 mg total) by mouth daily as needed (for acid reflux).    [name]. tiZANidine (ZANAFLEX) 2 MG tablet Take 1 tablet (2 mg total) by mouth every 8 (eight) hours as needed for muscle spasms. 45 tablet 1  . zolpidem (AMBIEN) 5 MG tablet 1 at bedtime if needed for sleep (Patient taking differently: Take 5 mg by mouth at bedtime as  needed for sleep. ) 30 tablet 5   No current facility-administered medications on file prior to visit.     PAST MEDICAL HISTORY: Past Medical History:  Diagnosis Date  . Abnormal EKG 06/2003   History of inverted T waves V1-V3. Normal 2D echo (07/23/2003): LVEF 65%.  . Anemia    BL Hgb 11-12. Ferritin 14 - low normal (08/2007). Colonoscopy 2009 - external hemorrhoids (excellent prep). Last anemia panel (12/2010) - Iron  24, TIBC 269,  B12  316, Folate 11.9, Ferritin 73.  . Asthma   .  B12 deficiency   . Back pain   . Chest pain   . Degenerative joint disease    BL knees (L>R), lumbar spine. Followed by Sports Medicine, Dr. Jennette Kettle.  . Dyspnea   . History of multiple pulmonary nodules    Incidental finding: CT Abd/ Pelvis (04/2010) - Several small lower lobe lung nodules, including one pure ground-glass pulmonary nodule measuring 8 mm in the left lower lobe.  Recommend follow-up chest CT (IV contrast preferred) in 6 months to document stability. //  CT Abd/ Pelvis (07/2010) -  3 mm RLL and  8 mm LLL nodule stable.  Other nodules unchanged, likely benign.  . Hyperlipidemia   . Hypertension   . Incarcerated ventral hernia 04/2010   Noted on CT Abd/ pelvis (04/2010). Patient now s/p ventral hernia repair by Dr. Gerrit Friends (12/2010)  . Insomnia   . 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 Left hip pain   . Left ventricular diastolic dysfunction   . Leg edema   . Lower extremity edema    Chronic. 2D echo (2005) - EF 65%.  . Obesity   . OSA (obstructive sleep apnea)   . Palpitations   . Positive D dimer   . Right knee pain     PAST SURGICAL HISTORY: Past Surgical History:  Procedure Laterality Date  . COLONOSCOPY  2009  . COLONOSCOPY WITH PROPOFOL N/A 12/18/2017   Procedure: COLONOSCOPY WITH PROPOFOL;  Surgeon: Iva Boop, MD;  Location: [facility];  Service: Endoscopy;  Laterality: N/A;  . FOOT SURGERY  2004    left  . INCISION AND DRAINAGE ABSCESS ANAL  07/2008   I&D and debridgement of anorectal abscess  . INCISIONAL HERNIA REPAIR  12/2010   Repair of incarcerated ventral incisional hernia with Ethicon mesh patch - performed by Dr. Gerrit Friends.   . INGUINAL HERNIA REPAIR    . JOINT REPLACEMENT Left 2013   left knee  . TOTAL KNEE ARTHROPLASTY  06/16/2011   Left TOTAL KNEE ARTHROPLASTY;  Surgeon: Kennieth Rad;  Location: [facility] OR;  Service: Orthopedics;  Laterality: Left;  left total knee arthroplasty  . TUBAL LIGATION      SOCIAL  HISTORY: Social History   Tobacco Use  . Smoking status: Former Smoker    Packs/day: 0.50    Years: 20.00    Pack years: 10.00    Types: Cigarettes    Last attempt to quit: 09/10/1998    Years since quitting: 19.7  . Smokeless tobacco: Never Used  . Tobacco comment: 62 yo   Substance Use Topics  . Alcohol use: No    Alcohol/week: 0.0 standard drinks  . Drug use: No    FAMILY HISTORY: Family History  Problem Relation Age of Onset  . Diabetes Mother   . Hypertension Mother   . Stroke Mother   . Hyperlipidemia Mother   . Heart disease Mother   .  Sudden death Mother   . Kidney disease Mother   . Depression Mother   . Dementia Father   . Heart disease Father   . Hyperlipidemia Father   . Sudden death Father   . Depression Father   . Hypertension Sister   . Diabetes Brother   . Hypertension Brother   . Rashes / Skin problems Maternal Grandfather   . Heart attack Neg Hx     ROS: Review of Systems  Constitutional: Positive for malaise/fatigue and weight loss.  Cardiovascular: Negative for chest pain and claudication.  Gastrointestinal: Negative for nausea and vomiting.  Musculoskeletal: Negative for myalgias.       Negative muscle weakness  Neurological: Negative for headaches.  Endo/Heme/Allergies:       Negative hypoglycemia Positive polyphagia    PHYSICAL EXAM: Blood pressure (!) 148/82, pulse 80, temperature 98.3 F (36.8 C), temperature source Oral, height 5\' 5"  (1.651 m), weight (!) 353 lb (160.1 kg), last menstrual period 09/09/2009, SpO2 98 %. Body mass index is 58.74 kg/m. Physical Exam  Constitutional: She is oriented to person, place, and time. She appears well-developed and well-nourished.  Cardiovascular: Normal rate.  Pulmonary/Chest: Effort normal.  Musculoskeletal: Normal range of motion.  Neurological: She is oriented to person, place, and time.  Skin: Skin is warm and dry.  Psychiatric: She has a normal mood and affect. Her behavior is  normal.  Vitals reviewed.   RECENT LABS AND TESTS: BMET    Component Value Date/Time   NA 141 05/09/2018 1007   K 4.6 05/09/2018 1007   CL 104 05/09/2018 1007   CO2 23 05/09/2018 1007   GLUCOSE 99 05/09/2018 1007   GLUCOSE 92 05/02/2018 1124   BUN 13 05/09/2018 1007   CREATININE 0.80 05/09/2018 1007   CREATININE 0.74 09/15/2017 1519   CALCIUM 10.1 05/09/2018 1007   GFRNONAA 80 05/09/2018 1007   GFRNONAA 81 03/21/2013 1530   GFRAA 92 05/09/2018 1007   GFRAA >89 03/21/2013 1530   Lab Results  Component Value Date   HGBA1C 5.7 05/02/2018   HGBA1C 5.3 10/25/2017   HGBA1C 5.6 12/02/2016   HGBA1C 5.7 (H) 04/02/2016   HGBA1C 5.7 05/04/2015   Lab Results  Component Value Date   INSULIN 12.2 05/09/2018   CBC    Component Value Date/Time   WBC 5.1 05/09/2018 1007   WBC 5.3 11/07/2017 1635   RBC 4.38 05/09/2018 1007   RBC 4.16 11/07/2017 1635   HGB 11.9 05/09/2018 1007   HCT 38.1 05/09/2018 1007   PLT 363.0 11/07/2017 1635   MCV 87 05/09/2018 1007   MCH 27.2 05/09/2018 1007   MCH 27.7 10/25/2017 0207   MCHC 31.2 (L) 05/09/2018 1007   MCHC 32.4 11/07/2017 1635   RDW 13.6 05/09/2018 1007   LYMPHSABS 1.7 05/09/2018 1007   MONOABS 0.4 11/07/2017 1635   EOSABS 0.3 05/09/2018 1007   BASOSABS 0.1 05/09/2018 1007   Iron/TIBC/Ferritin/ %Sat    Component Value Date/Time   IRON 47 10/25/2017 0859   TIBC 330 10/25/2017 0859   FERRITIN 15 10/25/2017 0859   IRONPCTSAT 14 10/25/2017 0859   IRONPCTSAT 17 (L) 11/17/2011 1656   Lipid Panel     Component Value Date/Time   CHOL 233 (H) 05/09/2018 1007   TRIG 95 05/09/2018 1007   HDL 47 05/09/2018 1007   CHOLHDL 4.6 10/25/2017 1208   VLDL 10 10/25/2017 1208   LDLCALC 167 (H) 05/09/2018 1007   Hepatic Function Panel     Component Value Date/Time  PROT 7.0 05/09/2018 1007   ALBUMIN 4.3 05/09/2018 1007   AST 13 05/09/2018 1007   ALT 10 05/09/2018 1007   ALKPHOS 117 05/09/2018 1007   BILITOT 0.3 05/09/2018 1007    BILIDIR 0.1 05/03/2010 0625   IBILI 0.3 05/03/2010 0625      Component Value Date/Time   TSH 1.900 05/09/2018 1007   TSH 3.440 10/25/2017 0859   TSH 1.307 07/16/2014 1202   TSH 1.821 11/02/2012 1235  Results for CYSTAL, SHANNAHAN (MRN [id number]) as of 05/29/2018 16:07  Ref. Range 05/09/2018 10:07  Vitamin D, 25-Hydroxy Latest Ref Range: 30.0 - 100.0 ng/mL 10.6 (L)    ASSESSMENT AND PLAN: Other hyperlipidemia  Vitamin D deficiency - Plan: Vitamin D, Ergocalciferol, (DRISDOL) 1.25 MG (50000 UT) CAPS capsule  Prediabetes  Essential hypertension  Class 3 severe obesity with serious comorbidity and body mass index (BMI) of 50.0 to 59.9 in adult, unspecified obesity type (HCC)  PLAN:  Hyperlipidemia (Mixed) [name] was informed of the American Heart Association Guidelines emphasizing intensive lifestyle modifications as the first line treatment for hyperlipidemia. We discussed many lifestyle modifications today in depth, and [name] will continue to work on decreasing saturated fats such as fatty red meat, butter and many fried foods. She will also increase vegetables and lean protein in her diet and continue to work on diet, exercise, and weight loss efforts. We will recheck labs in 3 months. [name] agrees to follow up with our clinic in [place], [place].  Vitamin D Deficiency [name] was informed that low vitamin D levels contributes to fatigue and are associated with obesity, breast, and colon cancer. [name] agrees to start prescription Vit D @50 ,000 IU every week #4 with no refills. She will follow up for routine testing of vitamin D, at least 2-3 times per year. She was informed of the risk of over-replacement of vitamin D and agrees to not increase her dose unless she discusses this with [place] first. We will recheck labs in 3 months. [name] agrees to follow up with our clinic in 3 weeks with Holly Salvage, FNP.  Pre-Diabetes [name] will continue to work on weight loss, diet, exercise, and  decreasing simple carbohydrates in her diet to help decrease the risk of diabetes. We dicussed metformin including benefits and risks. She was informed that eating too many simple carbohydrates or too many calories at one sitting increases the likelihood of GI side effects. [name] declined metformin for now and a prescription was not written today. We will recheck labs in 3 months. [name] agrees to follow up with our clinic in 3 weeks with Holly Salvage, FNP as directed to monitor her progress.  Hypertension We discussed sodium restriction, working on healthy weight loss, and a regular exercise program as the means to achieve improved blood pressure control. [name] agreed with this plan and agreed to follow up as directed. We will continue to monitor her blood pressure as well as her progress with the above lifestyle modifications. [name] agrees to continue taking lisinopril/hydrochlorothiazide, and she will continue with weight loss. She will watch for signs of hypotension as she continues her lifestyle modifications. We will recheck blood pressure at next visit. [name] agrees to follow up with our clinic in 3 weeks with Holly Salvage, FNP.  Obesity [name] is currently in the action stage of change. As such, her goal is to continue with weight loss efforts She has agreed to follow the Category 2 plan [name] has been instructed to work up to a goal of  150 minutes of combined cardio and strengthening exercise per week for weight loss and overall health benefits. We discussed the following Behavioral Modification Strategies today: increasing lean protein intake, decreasing simple carbohydrates  and work on meal planning and easy cooking plans   [name] has agreed to follow up with our clinic in 3 weeks with Holly Salvage, FNP. She was informed of the importance of frequent follow up visits to maximize her success with intensive lifestyle modifications for her multiple health conditions.   OBESITY  BEHAVIORAL INTERVENTION VISIT  Today's visit was # 2   Starting weight: 355 lbs Starting date: 05/09/18 Today's weight : 353 lbs  Today's date: 05/24/2018 Total lbs lost to date: 2 At least 15 minutes were spent on discussing the following behavioral intervention visit.   ASK: We discussed the diagnosis of obesity with Holly Blackbird today and Allecia agreed to give [place] permission to discuss obesity behavioral modification therapy today.  ASSESS: Kynsie has the diagnosis of obesity and her BMI today is 58.74 [place] is in the action stage of change   ADVISE: Kamrin was educated on the multiple health risks of obesity as well as the benefit of weight loss to improve her health. She was advised of the need for long term treatment and the importance of lifestyle modifications to improve her current health and to decrease her risk of future health problems.  AGREE: Multiple dietary modification options and treatment options were discussed and  Adriahna agreed to follow the recommendations documented in the above note.  ARRANGE: Atasha was educated on the importance of frequent visits to treat obesity as outlined per CMS and USPSTF guidelines and agreed to schedule her next follow up appointment today.  I, Burt Knack, am acting as transcriptionist for Quillian Quince, MD  I have reviewed the above documentation for accuracy and completeness, and I agree with the above. -Quillian Quince, MD

## 2018-06-12 ENCOUNTER — Telehealth (INDEPENDENT_AMBULATORY_CARE_PROVIDER_SITE_OTHER): Payer: Self-pay | Admitting: Physical Medicine and Rehabilitation

## 2018-06-12 NOTE — Telephone Encounter (Signed)
If it helped greatly then repeat and have Dr. Roda ShuttersXu follow up. If not much help then Dr. Roda ShuttersXu

## 2018-06-13 NOTE — Telephone Encounter (Signed)
Scheduled for 12/16 at 0845.

## 2018-06-14 ENCOUNTER — Encounter (INDEPENDENT_AMBULATORY_CARE_PROVIDER_SITE_OTHER): Payer: Self-pay | Admitting: Family Medicine

## 2018-06-14 ENCOUNTER — Ambulatory Visit (INDEPENDENT_AMBULATORY_CARE_PROVIDER_SITE_OTHER): Payer: Medicare Other | Admitting: Family Medicine

## 2018-06-14 VITALS — BP 135/75 | HR 70 | Temp 98.1°F | Ht 65.0 in | Wt 352.0 lb

## 2018-06-14 DIAGNOSIS — R7303 Prediabetes: Secondary | ICD-10-CM | POA: Diagnosis not present

## 2018-06-14 DIAGNOSIS — R269 Unspecified abnormalities of gait and mobility: Secondary | ICD-10-CM | POA: Diagnosis not present

## 2018-06-14 DIAGNOSIS — E559 Vitamin D deficiency, unspecified: Secondary | ICD-10-CM

## 2018-06-14 DIAGNOSIS — Z6841 Body Mass Index (BMI) 40.0 and over, adult: Secondary | ICD-10-CM | POA: Diagnosis not present

## 2018-06-14 DIAGNOSIS — I10 Essential (primary) hypertension: Secondary | ICD-10-CM | POA: Diagnosis not present

## 2018-06-14 MED ORDER — VITAMIN D (ERGOCALCIFEROL) 1.25 MG (50000 UNIT) PO CAPS: 50000.0000 [IU] | ORAL_CAPSULE | ORAL | 0 refills | Status: DC

## 2018-06-18 ENCOUNTER — Encounter (INDEPENDENT_AMBULATORY_CARE_PROVIDER_SITE_OTHER): Payer: Self-pay | Admitting: Family Medicine

## 2018-06-18 NOTE — Progress Notes (Signed)
Office: [phone]  /  Fax: [phone]   HPI:   Chief Complaint: OBESITY Analisse is here to discuss her progress with her obesity treatment plan. She is on the  follow the Category 2 plan and is following her eating plan approximately 90 % of the time. She states she is exercising 0 minutes 0 times per week. Safaa is not eating all of food on the plan. She often skips breakfast and has been eating cereal which is not on the plan.   Her weight is (!) 352 lb (159.7 kg) today and has had a weight loss of 1 pound over a period of 3 weeks since her last visit. She has lost 3 lbs since starting treatment with [place].  Pre-Diabetes Lissett has a diagnosis of prediabetes based on her elevated HgA1c and was informed this puts her at greater risk of developing diabetes. She is not taking metformin currently and continues to work on diet and exercise to decrease risk of diabetes. She denies nausea, polyphagia, or hypoglycemia. Lab Results  Component Value Date   HGBA1C 5.7 05/02/2018    Vitamin D deficiency Natividad has a diagnosis of vitamin D deficiency. She is currently taking vit D and denies nausea, vomiting or muscle weakness. She reports fatigue and insomnia secondary to chronic left hip pain.   Ref. Range 05/09/2018 10:07  Vitamin D, 25-Hydroxy Latest Ref Range: 30.0 - 100.0 ng/mL 10.6 (L)   ALLERGIES: Allergies  Allergen Reactions  . Ketorolac Tromethamine Shortness Of Breath and Palpitations  . Atrovent [Ipratropium] Hives and Rash  . Latex Other (See Comments)    Powdered. Confirm type of reaction with patient.    MEDICATIONS: Current Outpatient Medications on File Prior to Visit  Medication Sig Dispense Refill  . albuterol (PROVENTIL HFA;VENTOLIN HFA) 108 (90 Base) MCG/ACT inhaler Inhale 1-2 puffs into the lungs every 6 (six) hours as needed for wheezing or shortness of breath. 3 Inhaler 4  . budesonide-formoterol (SYMBICORT) 160-4.5 MCG/ACT inhaler Inhale 2 puffs into the lungs  2 (two) times daily. 3 Inhaler 4  . diclofenac (VOLTAREN) 75 MG EC tablet Take 75 mg by mouth 2 (two) times daily.   1  . DULoxetine (CYMBALTA) 60 MG capsule Take 1 capsule (60 mg total) by mouth daily. 90 capsule 2  . levocetirizine (XYZAL) 5 MG tablet Take 1 tablet (5 mg total) by mouth daily as needed for allergies.    [name] lisinopril-hydrochlorothiazide (PRINZIDE,ZESTORETIC) 20-25 MG tablet Take 1 tablet by mouth daily. 90 tablet 1  . montelukast (SINGULAIR) 10 MG tablet Take 1 tablet (10 mg total) by mouth at bedtime. (Patient taking differently: Take 10 mg by mouth daily as needed (Asthma). ) 90 tablet 1  . pantoprazole (PROTONIX) 40 MG tablet Take 1 tablet (40 mg total) by mouth daily as needed (for acid reflux).    [name] tiZANidine (ZANAFLEX) 2 MG tablet Take 1 tablet (2 mg total) by mouth every 8 (eight) hours as needed for muscle spasms. 45 tablet 1  . zolpidem (AMBIEN) 5 MG tablet 1 at bedtime if needed for sleep (Patient taking differently: Take 5 mg by mouth at bedtime as needed for sleep. ) 30 tablet 5   No current facility-administered medications on file prior to visit.     PAST MEDICAL HISTORY: Past Medical History:  Diagnosis Date  . Abnormal EKG 06/2003   History of inverted T waves V1-V3. Normal 2D echo (07/23/2003): LVEF 65%.  . Anemia    BL Hgb 11-12. Ferritin 14 -  low normal (08/2007). Colonoscopy 2009 - external hemorrhoids (excellent prep). Last anemia panel (12/2010) - Iron  24, TIBC 269,  B12  316, Folate 11.9, Ferritin 73.  . Asthma   . B12 deficiency   . Back pain   . Chest pain   . Degenerative joint disease    BL knees (L>R), lumbar spine. Followed by Sports Medicine, Dr. Jennette KettleNeal.  . Dyspnea   . History of multiple pulmonary nodules    Incidental finding: CT Abd/ Pelvis (04/2010) - Several small lower lobe lung nodules, including one pure ground-glass pulmonary nodule measuring 8 mm in the left lower lobe.  Recommend follow-up chest CT (IV contrast preferred) in 6  months to document stability. //  CT Abd/ Pelvis (07/2010) -  3 mm RLL and  8 mm LLL nodule stable.  Other nodules unchanged, likely benign.  . Hyperlipidemia   . Hypertension   . Incarcerated ventral hernia 04/2010   Noted on CT Abd/ pelvis (04/2010). Patient now s/p ventral hernia repair by Dr. Gerrit FriendsGerkin (12/2010)  . Insomnia   . 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 Left hip pain   . Left ventricular diastolic dysfunction   . Leg edema   . Lower extremity edema    Chronic. 2D echo (2005) - EF 65%.  . Obesity   . OSA (obstructive sleep apnea)   . Palpitations   . Positive D dimer   . Right knee pain     PAST SURGICAL HISTORY: Past Surgical History:  Procedure Laterality Date  . COLONOSCOPY  2009  . COLONOSCOPY WITH PROPOFOL N/A 12/18/2017   Procedure: COLONOSCOPY WITH PROPOFOL;  Surgeon: Iva BoopGessner, Carl E, MD;  Location: [facility];  Service: Endoscopy;  Laterality: N/A;  . FOOT SURGERY  2004    left  . INCISION AND DRAINAGE ABSCESS ANAL  07/2008   I&D and debridgement of anorectal abscess  . INCISIONAL HERNIA REPAIR  12/2010   Repair of incarcerated ventral incisional hernia with Ethicon mesh patch - performed by Dr. Gerrit FriendsGerkin.   . INGUINAL HERNIA REPAIR    . JOINT REPLACEMENT Left 2013   left knee  . TOTAL KNEE ARTHROPLASTY  06/16/2011   Left TOTAL KNEE ARTHROPLASTY;  Surgeon: Kennieth RadArthur F Carter;  Location: [facility] OR;  Service: Orthopedics;  Laterality: Left;  left total knee arthroplasty  . TUBAL LIGATION      SOCIAL HISTORY: Social History   Tobacco Use  . Smoking status: Former Smoker    Packs/day: 0.50    Years: 20.00    Pack years: 10.00    Types: Cigarettes    Last attempt to quit: 09/10/1998    Years since quitting: 19.7  . Smokeless tobacco: Never Used  . Tobacco comment: 62 yo   Substance Use Topics  . Alcohol use: No    Alcohol/week: 0.0 standard drinks  . Drug use: No    FAMILY HISTORY: Family History  Problem Relation Age of  Onset  . Diabetes Mother   . Hypertension Mother   . Stroke Mother   . Hyperlipidemia Mother   . Heart disease Mother   . Sudden death Mother   . Kidney disease Mother   . Depression Mother   . Dementia Father   . Heart disease Father   . Hyperlipidemia Father   . Sudden death Father   . Depression Father   . Hypertension Sister   . Diabetes Brother   . Hypertension Brother   . Rashes /  Skin problems Maternal Grandfather   . Heart attack Neg Hx     ROS: Review of Systems  Constitutional: Positive for malaise/fatigue and weight loss.  Gastrointestinal: Negative for nausea and vomiting.  Musculoskeletal:       Negative for muscle weakness  Endo/Heme/Allergies:       Negative for hypoglycemia  Negative for polyphagia  Psychiatric/Behavioral: The patient has insomnia.     PHYSICAL EXAM: Blood pressure 135/75, pulse 70, temperature 98.1 F (36.7 C), temperature source Oral, height 5\' 5"  (1.651 m), weight (!) 352 lb (159.7 kg), last menstrual period 09/09/2009, SpO2 96 %. Body mass index is 58.58 kg/m. Physical Exam  Constitutional: She is oriented to person, place, and time. She appears well-developed and well-nourished.  HENT:  Head: Normocephalic.  Eyes: Pupils are equal, round, and reactive to light.  Neck: Normal range of motion.  Cardiovascular: Normal rate.  Pulmonary/Chest: Effort normal.  Musculoskeletal: Normal range of motion.  Neurological: She is alert and oriented to person, place, and time.  Skin: Skin is warm and dry.  Psychiatric: She has a normal mood and affect. Her behavior is normal.  Vitals reviewed.   RECENT LABS AND TESTS: BMET    Component Value Date/Time   NA 141 05/09/2018 1007   K 4.6 05/09/2018 1007   CL 104 05/09/2018 1007   CO2 23 05/09/2018 1007   GLUCOSE 99 05/09/2018 1007   GLUCOSE 92 05/02/2018 1124   BUN 13 05/09/2018 1007   CREATININE 0.80 05/09/2018 1007   CREATININE 0.74 09/15/2017 1519   CALCIUM 10.1 05/09/2018 1007     GFRNONAA 80 05/09/2018 1007   GFRNONAA 81 03/21/2013 1530   GFRAA 92 05/09/2018 1007   GFRAA >89 03/21/2013 1530   Lab Results  Component Value Date   HGBA1C 5.7 05/02/2018   HGBA1C 5.3 10/25/2017   HGBA1C 5.6 12/02/2016   HGBA1C 5.7 (H) 04/02/2016   HGBA1C 5.7 05/04/2015   Lab Results  Component Value Date   INSULIN 12.2 05/09/2018   CBC    Component Value Date/Time   WBC 5.1 05/09/2018 1007   WBC 5.3 11/07/2017 1635   RBC 4.38 05/09/2018 1007   RBC 4.16 11/07/2017 1635   HGB 11.9 05/09/2018 1007   HCT 38.1 05/09/2018 1007   PLT 363.0 11/07/2017 1635   MCV 87 05/09/2018 1007   MCH 27.2 05/09/2018 1007   MCH 27.7 10/25/2017 0207   MCHC 31.2 (L) 05/09/2018 1007   MCHC 32.4 11/07/2017 1635   RDW 13.6 05/09/2018 1007   LYMPHSABS 1.7 05/09/2018 1007   MONOABS 0.4 11/07/2017 1635   EOSABS 0.3 05/09/2018 1007   BASOSABS 0.1 05/09/2018 1007   Iron/TIBC/Ferritin/ %Sat    Component Value Date/Time   IRON 47 10/25/2017 0859   TIBC 330 10/25/2017 0859   FERRITIN 15 10/25/2017 0859   IRONPCTSAT 14 10/25/2017 0859   IRONPCTSAT 17 (L) 11/17/2011 1656   Lipid Panel     Component Value Date/Time   CHOL 233 (H) 05/09/2018 1007   TRIG 95 05/09/2018 1007   HDL 47 05/09/2018 1007   CHOLHDL 4.6 10/25/2017 1208   VLDL 10 10/25/2017 1208   LDLCALC 167 (H) 05/09/2018 1007   Hepatic Function Panel     Component Value Date/Time   PROT 7.0 05/09/2018 1007   ALBUMIN 4.3 05/09/2018 1007   AST 13 05/09/2018 1007   ALT 10 05/09/2018 1007   ALKPHOS 117 05/09/2018 1007   BILITOT 0.3 05/09/2018 1007   BILIDIR 0.1 05/03/2010 1610  IBILI 0.3 05/03/2010 0625      Component Value Date/Time   TSH 1.900 05/09/2018 1007   TSH 3.440 10/25/2017 0859   TSH 1.307 07/16/2014 1202   TSH 1.821 11/02/2012 1235    Ref. Range 05/09/2018 10:07  Vitamin D, 25-Hydroxy Latest Ref Range: 30.0 - 100.0 ng/mL 10.6 (L)   ASSESSMENT AND PLAN: Prediabetes  Vitamin D deficiency - Plan: Vitamin  D, Ergocalciferol, (DRISDOL) 1.25 MG (50000 UT) CAPS capsule  Class 3 severe obesity with serious comorbidity and body mass index (BMI) of 50.0 to 59.9 in adult, unspecified obesity type [facility])  PLAN: Pre-Diabetes Velmer will continue to work on weight loss, exercise, and decreasing simple carbohydrates in her diet to help decrease the risk of diabetes. We dicussed metformin including benefits and risks. She was informed that eating too many simple carbohydrates or too many calories at one sitting increases the likelihood of GI side effects. Lawana agrees to continue with meal plan. Shardae agreed to follow up with [place] as directed to monitor her progress.  Vitamin D Deficiency Pinky was informed that low vitamin D levels contributes to fatigue and are associated with obesity, breast, and colon cancer. She agrees to continue to take prescription Vit D @50 ,000 IU every week #4 with no refills and will follow up for routine testing of vitamin D, at least 2-3 times per year. She was informed of the risk of over-replacement of vitamin D and agrees to not increase her dose unless she discusses this with [place] first. Agrees to follow up with our clinic as directed.   Obesity Tyera is currently in the action stage of change. As such, her goal is to continue with weight loss efforts She has agreed to follow the Category 2 plan with additional breakfast options.  Aubriegh has not been prescribed exercise at this time. We discussed the following Behavioral Modification Strategies today: increasing lean protein intake, no skipping meals, and planning for success.  We will add category 2 breakfast options to her plan.    Marymargaret has agreed to follow up with our clinic in 2 weeks. She was informed of the importance of frequent follow up visits to maximize her success with intensive lifestyle modifications for her multiple health conditions.   OBESITY BEHAVIORAL INTERVENTION VISIT  Today's visit was # 3    Starting weight: 355 lb Starting date: 05/09/18 Today's weight : Weight: (!) 352 lb (159.7 kg)  Today's date: 06/14/18 Total lbs lost to date: 3 lb At least 15 minutes were spent on discussing the following behavioral intervention visit.   ASK: We discussed the diagnosis of obesity with [name] today and Malena agreed to give [place] permission to discuss obesity behavioral modification therapy today.  ASSESS: Caoilainn has the diagnosis of obesity and her BMI today is 58.58 Dajae is in the action stage of change   ADVISE: Kechia was educated on the multiple health risks of obesity as well as the benefit of weight loss to improve her health. She was advised of the need for long term treatment and the importance of lifestyle modifications to improve her current health and to decrease her risk of future health problems.  AGREE: Multiple dietary modification options and treatment options were discussed and  Hassie agreed to follow the recommendations documented in the above note.  ARRANGE: Oluwatosin was educated on the importance of frequent visits to treat obesity as outlined per CMS and USPSTF guidelines and agreed to schedule her next follow up appointment today.  I, Jeralene Peters, am acting as Energy manager for [place], FNP-C.  I have reviewed the above documentation for accuracy and completeness, and I agree with the above.  - Dawn Whitmire, FNP-C.

## 2018-06-25 ENCOUNTER — Ambulatory Visit (INDEPENDENT_AMBULATORY_CARE_PROVIDER_SITE_OTHER): Payer: Self-pay

## 2018-06-25 ENCOUNTER — Ambulatory Visit (INDEPENDENT_AMBULATORY_CARE_PROVIDER_SITE_OTHER): Payer: Medicare Other | Admitting: Physical Medicine and Rehabilitation

## 2018-06-25 ENCOUNTER — Encounter (INDEPENDENT_AMBULATORY_CARE_PROVIDER_SITE_OTHER): Payer: Self-pay | Admitting: Physical Medicine and Rehabilitation

## 2018-06-25 DIAGNOSIS — M25552 Pain in left hip: Secondary | ICD-10-CM | POA: Diagnosis not present

## 2018-06-25 NOTE — Progress Notes (Signed)
.  Numeric Pain Rating Scale and Functional Assessment Average Pain 9   In the last MONTH (on 0-10 scale) has pain interfered with the following?  1. General activity like being  able to carry out your everyday physical activities such as walking, climbing stairs, carrying groceries, or moving a chair?  Rating(8)   +Driver, -BT, -Dye Allergies.  

## 2018-06-25 NOTE — Patient Instructions (Signed)

## 2018-06-25 NOTE — Progress Notes (Signed)
   [name] - 62 y.o. female MRN [id number]  Date of birth: February 01, 1956  Office Visit Note: Visit Date: 06/25/2018 PCP: [place], [name], MD Referred by: [place], [name], MD  Subjective: Chief Complaint  Patient presents with  . Left Hip - Pain  . Left Leg - Pain   HPI:  [name] is a 62 y.o. female who comes in today For planned repeat left intra-articular hip injection fluoroscopic guidance.  Prior injection in October gave her 90% relief up until 2 weeks ago.  She is had no new trauma or other injury.  She reports worsening with ambulation and standing but also lying down.  She gets left hip pain and groin pain.  She is morbidly obese with BMI greater than 50.  She is currently on Cymbalta.  She is working with bariatric group.  We will go ahead and repeat the injection today but prior to any other injections she will follow-up with Dr. Roda ShuttersXu.  ROS Otherwise per HPI.  Assessment & Plan: Visit Diagnoses:  1. Pain in left hip     Plan: No additional findings.   Meds & Orders: No orders of the defined types were placed in this encounter.   Orders Placed This Encounter  Procedures  . Large Joint Inj  . XR C-ARM NO REPORT    Follow-up: No follow-ups on file.   Procedures: Large Joint Inj on 06/25/2018 8:52 AM Indications: diagnostic evaluation and pain Details: 22 G 3.5 in needle, fluoroscopy-guided anterior approach  Arthrogram: No  Medications: 80 mg triamcinolone acetonide 40 MG/ML; 3 mL bupivacaine 0.5 % Outcome: tolerated well, no immediate complications  There was excellent flow of contrast producing a partial arthrogram of the hip. The patient did have relief of symptoms during the anesthetic phase of the injection. Procedure, treatment alternatives, risks and benefits explained, specific risks discussed. Consent was given by the patient. Immediately prior to procedure a time out was called to verify the correct patient, procedure, equipment, support staff and  site/side marked as required. Patient was prepped and draped in the usual sterile fashion.      No notes on file   Clinical History: No specialty comments available.     Objective:  VS:  HT:    WT:   BMI:     BP:   HR: bpm  TEMP: ( )  RESP:  Physical Exam  Ortho Exam Imaging: Xr C-arm No Report  Result Date: 06/25/2018 Please see Notes tab for imaging impression.

## 2018-06-26 MED ORDER — TRIAMCINOLONE ACETONIDE 40 MG/ML IJ SUSP
80.0000 mg | INTRAMUSCULAR | Status: AC | PRN
  Administered 2018-06-25: 80 mg via INTRA_ARTICULAR

## 2018-06-26 MED ORDER — BUPIVACAINE HCL 0.5 % IJ SOLN
3.0000 mL | INTRAMUSCULAR | Status: AC | PRN
  Administered 2018-06-25: 3 mL via INTRA_ARTICULAR

## 2018-06-28 ENCOUNTER — Ambulatory Visit (INDEPENDENT_AMBULATORY_CARE_PROVIDER_SITE_OTHER): Payer: Self-pay | Admitting: Family Medicine

## 2018-06-28 ENCOUNTER — Encounter (INDEPENDENT_AMBULATORY_CARE_PROVIDER_SITE_OTHER): Payer: Self-pay

## 2018-07-02 ENCOUNTER — Encounter (INDEPENDENT_AMBULATORY_CARE_PROVIDER_SITE_OTHER): Payer: Self-pay | Admitting: Family Medicine

## 2018-07-02 ENCOUNTER — Ambulatory Visit (INDEPENDENT_AMBULATORY_CARE_PROVIDER_SITE_OTHER): Payer: Medicare Other | Admitting: Family Medicine

## 2018-07-02 VITALS — BP 137/65 | HR 61 | Temp 98.4°F | Ht 65.0 in | Wt 347.0 lb

## 2018-07-02 DIAGNOSIS — E559 Vitamin D deficiency, unspecified: Secondary | ICD-10-CM

## 2018-07-02 DIAGNOSIS — Z6841 Body Mass Index (BMI) 40.0 and over, adult: Secondary | ICD-10-CM | POA: Diagnosis not present

## 2018-07-03 NOTE — Progress Notes (Signed)
Office: [phone]  /  Fax: [phone]   HPI:   Chief Complaint: OBESITY [name] is here to discuss her progress with her obesity treatment plan. She is on the  follow the Category 2 plan and is following her eating plan approximately 50 % of the time. She states she is exercising 0 minutes 0 times per week. [name] reports quick satiety. Tries to eat protein first. She is often not hungry and skips meals- especially breakfast.  Her weight is (!) 347 lb (157.4 kg) today and has had a weight loss of 5 pounds over a period of 2 weeks since her last visit. She has lost 8 lbs since starting treatment with us.  Vitamin D deficiency [name] has a diagnosis of vitamin D deficiency. Vitamin D level is not at goal.  She is currently taking vit D and denies nausea, vomiting or muscle weakness. She does report fatigue.  Results for Antony BlackbirdVANS, Jyla W (MRN [id number]) as of 07/09/2018 11:30  Ref. Range 05/09/2018 10:07  Vitamin D, 25-Hydroxy Latest Ref Range: 30.0 - 100.0 ng/mL 10.6 (L)    ASSESSMENT AND PLAN:  Vitamin D deficiency  Class 3 severe obesity with serious comorbidity and body mass index (BMI) of 50.0 to 59.9 in adult, unspecified obesity type (HCC)  PLAN:  Vitamin D Deficiency [name] was informed that low vitamin D levels contributes to fatigue and are associated with obesity, breast, and colon cancer. She agrees to continue to take prescription Vit D @50 ,000 IU every week and will follow up for routine testing of vitamin D, at least 2-3 times per year. She was informed of the risk of over-replacement of vitamin D and agrees to not increase her dose unless she discusses this with us first. She does report fatigue.   I spent > than 50% of the 15 minute visit on counseling as documented in the note.  Obesity [name] is currently in the action stage of change. As such, her goal is to continue with weight loss efforts She has agreed to follow the Category 2 plan [name] has not been  prescribed exercise at this time.  We discussed the following Behavioral Modification Stratagies today: increasing lean protein intake, no skipping meals, and planning for success. Advised to eat all of her food.    [name] has agreed to follow up with our clinic in 2 weeks. She was informed of the importance of frequent follow up visits to maximize her success with intensive lifestyle modifications for her multiple health conditions.  ALLERGIES: Allergies  Allergen Reactions  . Ketorolac Tromethamine Shortness Of Breath and Palpitations  . Atrovent [Ipratropium] Hives and Rash  . Latex Other (See Comments)    Powdered. Confirm type of reaction with patient.    MEDICATIONS: Current Outpatient Medications on File Prior to Visit  Medication Sig Dispense Refill  . albuterol (PROVENTIL HFA;VENTOLIN HFA) 108 (90 Base) MCG/ACT inhaler Inhale 1-2 puffs into the lungs every 6 (six) hours as needed for wheezing or shortness of breath. 3 Inhaler 4  . budesonide-formoterol (SYMBICORT) 160-4.5 MCG/ACT inhaler Inhale 2 puffs into the lungs 2 (two) times daily. 3 Inhaler 4  . diclofenac (VOLTAREN) 75 MG EC tablet Take 75 mg by mouth 2 (two) times daily.   1  . DULoxetine (CYMBALTA) 60 MG capsule Take 1 capsule (60 mg total) by mouth daily. 90 capsule 2  . levocetirizine (XYZAL) 5 MG tablet Take 1 tablet (5 mg total) by mouth daily as needed for allergies.    [name]. lisinopril-hydrochlorothiazide (  PRINZIDE,ZESTORETIC) 20-25 MG tablet Take 1 tablet by mouth daily. 90 tablet 1  . montelukast (SINGULAIR) 10 MG tablet Take 1 tablet (10 mg total) by mouth at bedtime. (Patient taking differently: Take 10 mg by mouth daily as needed (Asthma). ) 90 tablet 1  . pantoprazole (PROTONIX) 40 MG tablet Take 1 tablet (40 mg total) by mouth daily as needed (for acid reflux).    [name]. tiZANidine (ZANAFLEX) 2 MG tablet Take 1 tablet (2 mg total) by mouth every 8 (eight) hours as needed for muscle spasms. 45 tablet 1  . Vitamin D,  Ergocalciferol, (DRISDOL) 1.25 MG (50000 UT) CAPS capsule Take 1 capsule (50,000 Units total) by mouth every 7 (seven) days. 4 capsule 0  . zolpidem (AMBIEN) 5 MG tablet 1 at bedtime if needed for sleep (Patient taking differently: Take 5 mg by mouth at bedtime as needed for sleep. ) 30 tablet 5   No current facility-administered medications on file prior to visit.     PAST MEDICAL HISTORY: Past Medical History:  Diagnosis Date  . Abnormal EKG 06/2003   History of inverted T waves V1-V3. Normal 2D echo (07/23/2003): LVEF 65%.  . Anemia    BL Hgb 11-12. Ferritin 14 - low normal (08/2007). Colonoscopy 2009 - external hemorrhoids (excellent prep). Last anemia panel (12/2010) - Iron  24, TIBC 269,  B12  316, Folate 11.9, Ferritin 73.  . Asthma   . B12 deficiency   . Back pain   . Chest pain   . Degenerative joint disease    BL knees (L>R), lumbar spine. Followed by Sports Medicine, Dr. Jennette KettleNeal.  . Dyspnea   . History of multiple pulmonary nodules    Incidental finding: CT Abd/ Pelvis (04/2010) - Several small lower lobe lung nodules, including one pure ground-glass pulmonary nodule measuring 8 mm in the left lower lobe.  Recommend follow-up chest CT (IV contrast preferred) in 6 months to document stability. //  CT Abd/ Pelvis (07/2010) -  3 mm RLL and  8 mm LLL nodule stable.  Other nodules unchanged, likely benign.  . Hyperlipidemia   . Hypertension   . Incarcerated ventral hernia 04/2010   Noted on CT Abd/ pelvis (04/2010). Patient now s/p ventral hernia repair by Dr. Gerrit FriendsGerkin (12/2010)  . Insomnia   . 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 Left hip pain   . Left ventricular diastolic dysfunction   . Leg edema   . Lower extremity edema    Chronic. 2D echo (2005) - EF 65%.  . Obesity   . OSA (obstructive sleep apnea)   . Palpitations   . Positive D dimer   . Right knee pain     PAST SURGICAL HISTORY: Past Surgical History:  Procedure Laterality Date  .  COLONOSCOPY  2009  . COLONOSCOPY WITH PROPOFOL N/A 12/18/2017   Procedure: COLONOSCOPY WITH PROPOFOL;  Surgeon: Iva BoopGessner, Carl E, MD;  Location: [facility];  Service: Endoscopy;  Laterality: N/A;  . FOOT SURGERY  2004    left  . INCISION AND DRAINAGE ABSCESS ANAL  07/2008   I&D and debridgement of anorectal abscess  . INCISIONAL HERNIA REPAIR  12/2010   Repair of incarcerated ventral incisional hernia with Ethicon mesh patch - performed by Dr. Gerrit FriendsGerkin.   . INGUINAL HERNIA REPAIR    . JOINT REPLACEMENT Left 2013   left knee  . TOTAL KNEE ARTHROPLASTY  06/16/2011   Left TOTAL KNEE ARTHROPLASTY;  Surgeon: Kennieth RadArthur F Carter;  Location: [facility] OR;  Service: Orthopedics;  Laterality: Left;  left total knee arthroplasty  . TUBAL LIGATION      SOCIAL HISTORY: Social History   Tobacco Use  . Smoking status: Former Smoker    Packs/day: 0.50    Years: 20.00    Pack years: 10.00    Types: Cigarettes    Last attempt to quit: 09/10/1998    Years since quitting: 19.8  . Smokeless tobacco: Never Used  . Tobacco comment: 62 yo   Substance Use Topics  . Alcohol use: No    Alcohol/week: 0.0 standard drinks  . Drug use: No    FAMILY HISTORY: Family History  Problem Relation Age of Onset  . Diabetes Mother   . Hypertension Mother   . Stroke Mother   . Hyperlipidemia Mother   . Heart disease Mother   . Sudden death Mother   . Kidney disease Mother   . Depression Mother   . Dementia Father   . Heart disease Father   . Hyperlipidemia Father   . Sudden death Father   . Depression Father   . Hypertension Sister   . Diabetes Brother   . Hypertension Brother   . Rashes / Skin problems Maternal Grandfather   . Heart attack Neg Hx     ROS: Review of Systems  Constitutional: Positive for malaise/fatigue and weight loss.  Gastrointestinal: Negative for nausea and vomiting.  Neurological: Negative for weakness.    PHYSICAL EXAM: Blood pressure 137/65, pulse 61, temperature 98.4 F (36.9 C),  temperature source Oral, height 5\' 5"  (1.651 m), weight (!) 347 lb (157.4 kg), last menstrual period 09/09/2009, SpO2 97 %. Body mass index is 57.74 kg/m. Physical Exam Vitals signs reviewed.  HENT:     Head: Normocephalic.  Eyes:     Extraocular Movements: Extraocular movements intact.  Neck:     Musculoskeletal: Normal range of motion.  Cardiovascular:     Rate and Rhythm: Normal rate.  Pulmonary:     Effort: Pulmonary effort is normal.  Musculoskeletal: Normal range of motion.  Skin:    General: Skin is warm and dry.  Neurological:     Mental Status: She is alert and oriented to person, place, and time.     RECENT LABS AND TESTS: BMET    Component Value Date/Time   NA 141 05/09/2018 1007   K 4.6 05/09/2018 1007   CL 104 05/09/2018 1007   CO2 23 05/09/2018 1007   GLUCOSE 99 05/09/2018 1007   GLUCOSE 92 05/02/2018 1124   BUN 13 05/09/2018 1007   CREATININE 0.80 05/09/2018 1007   CREATININE 0.74 09/15/2017 1519   CALCIUM 10.1 05/09/2018 1007   GFRNONAA 80 05/09/2018 1007   GFRNONAA 81 03/21/2013 1530   GFRAA 92 05/09/2018 1007   GFRAA >89 03/21/2013 1530   Lab Results  Component Value Date   HGBA1C 5.7 05/02/2018   HGBA1C 5.3 10/25/2017   HGBA1C 5.6 12/02/2016   HGBA1C 5.7 (H) 04/02/2016   HGBA1C 5.7 05/04/2015   Lab Results  Component Value Date   INSULIN 12.2 05/09/2018   CBC    Component Value Date/Time   WBC 5.1 05/09/2018 1007   WBC 5.3 11/07/2017 1635   RBC 4.38 05/09/2018 1007   RBC 4.16 11/07/2017 1635   HGB 11.9 05/09/2018 1007   HCT 38.1 05/09/2018 1007   PLT 363.0 11/07/2017 1635   MCV 87 05/09/2018 1007   MCH 27.2 05/09/2018 1007   MCH 27.7 10/25/2017 0207  MCHC 31.2 (L) 05/09/2018 1007   MCHC 32.4 11/07/2017 1635   RDW 13.6 05/09/2018 1007   LYMPHSABS 1.7 05/09/2018 1007   MONOABS 0.4 11/07/2017 1635   EOSABS 0.3 05/09/2018 1007   BASOSABS 0.1 05/09/2018 1007   Iron/TIBC/Ferritin/ %Sat    Component Value Date/Time   IRON 47  10/25/2017 0859   TIBC 330 10/25/2017 0859   FERRITIN 15 10/25/2017 0859   IRONPCTSAT 14 10/25/2017 0859   IRONPCTSAT 17 (L) 11/17/2011 1656   Lipid Panel     Component Value Date/Time   CHOL 233 (H) 05/09/2018 1007   TRIG 95 05/09/2018 1007   HDL 47 05/09/2018 1007   CHOLHDL 4.6 10/25/2017 1208   VLDL 10 10/25/2017 1208   LDLCALC 167 (H) 05/09/2018 1007   Hepatic Function Panel     Component Value Date/Time   PROT 7.0 05/09/2018 1007   ALBUMIN 4.3 05/09/2018 1007   AST 13 05/09/2018 1007   ALT 10 05/09/2018 1007   ALKPHOS 117 05/09/2018 1007   BILITOT 0.3 05/09/2018 1007   BILIDIR 0.1 05/03/2010 0625   IBILI 0.3 05/03/2010 0625      Component Value Date/Time   TSH 1.900 05/09/2018 1007   TSH 3.440 10/25/2017 0859   TSH 1.307 07/16/2014 1202   TSH 1.821 11/02/2012 1235      OBESITY BEHAVIORAL INTERVENTION VISIT  Today's visit was # 4   Starting weight: 355 lbs Starting date: 05/09/18 Today's weight : Weight: (!) 347 lb (157.4 kg)  Today's date: 07/02/18 Total lbs lost to date: 8    ASK: We discussed the diagnosis of obesity with [name] today and Shaquisha agreed to give [place] permission to discuss obesity behavioral modification therapy today.  ASSESS: Letica has the diagnosis of obesity and her BMI today is 23.8 Hailynn is in the action stage of change   ADVISE: Lorielle was educated on the multiple health risks of obesity as well as the benefit of weight loss to improve her health. She was advised of the need for long term treatment and the importance of lifestyle modifications to improve her current health and to decrease her risk of future health problems.  AGREE: Multiple dietary modification options and treatment options were discussed and  Braeden agreed to follow the recommendations documented in the above note.  ARRANGE: Airiel was educated on the importance of frequent visits to treat obesity as outlined per CMS and USPSTF guidelines and agreed  to schedule her next follow up appointment today.  I, April Moore, am acting as Energy manager for [place].  I have reviewed the above documentation for accuracy and completeness, and I agree with the above.  - Walker Sitar, FNP-C.

## 2018-07-09 ENCOUNTER — Encounter (INDEPENDENT_AMBULATORY_CARE_PROVIDER_SITE_OTHER): Payer: Self-pay | Admitting: Family Medicine

## 2018-07-15 DIAGNOSIS — R269 Unspecified abnormalities of gait and mobility: Secondary | ICD-10-CM | POA: Diagnosis not present

## 2018-07-15 DIAGNOSIS — I10 Essential (primary) hypertension: Secondary | ICD-10-CM | POA: Diagnosis not present

## 2018-07-17 ENCOUNTER — Encounter (INDEPENDENT_AMBULATORY_CARE_PROVIDER_SITE_OTHER): Payer: Self-pay | Admitting: Family Medicine

## 2018-07-17 ENCOUNTER — Ambulatory Visit (INDEPENDENT_AMBULATORY_CARE_PROVIDER_SITE_OTHER): Payer: Medicare Other | Admitting: Family Medicine

## 2018-07-17 VITALS — BP 177/79 | HR 70 | Temp 98.5°F | Ht 65.0 in | Wt 349.0 lb

## 2018-07-17 DIAGNOSIS — Z6841 Body Mass Index (BMI) 40.0 and over, adult: Secondary | ICD-10-CM | POA: Diagnosis not present

## 2018-07-17 DIAGNOSIS — I10 Essential (primary) hypertension: Secondary | ICD-10-CM

## 2018-07-17 DIAGNOSIS — R7303 Prediabetes: Secondary | ICD-10-CM | POA: Diagnosis not present

## 2018-07-17 DIAGNOSIS — E559 Vitamin D deficiency, unspecified: Secondary | ICD-10-CM

## 2018-07-17 MED ORDER — VITAMIN D (ERGOCALCIFEROL) 1.25 MG (50000 UNIT) PO CAPS: 50000.0000 [IU] | ORAL_CAPSULE | ORAL | 0 refills | Status: DC

## 2018-07-18 ENCOUNTER — Encounter (INDEPENDENT_AMBULATORY_CARE_PROVIDER_SITE_OTHER): Payer: Self-pay | Admitting: Family Medicine

## 2018-07-18 DIAGNOSIS — R7303 Prediabetes: Secondary | ICD-10-CM | POA: Insufficient documentation

## 2018-07-18 DIAGNOSIS — Z6841 Body Mass Index (BMI) 40.0 and over, adult: Secondary | ICD-10-CM

## 2018-07-18 DIAGNOSIS — E559 Vitamin D deficiency, unspecified: Secondary | ICD-10-CM | POA: Insufficient documentation

## 2018-07-18 NOTE — Progress Notes (Signed)
Office: [phone]  /  Fax: [phone]   HPI:   Chief Complaint: OBESITY [name] is here to discuss her progress with her obesity treatment plan. She is on the Category 2 plan and is following her eating plan approximately 85 % of the time. She states she is walking 30 minutes 3 times per week. [name] was slightly off her plan over the holidays. She is now back on the plan. She is fasting for church for 3 days presently which consists of eating no meat. This fasting occurs for 3 days monthly.  Her weight is (!) 349 lb (158.3 kg) today and has had a weight gain of 2 pounds over a period of 2 weeks since her last visit. She has lost 6 lbs since starting treatment with us.  Vitamin D deficiency [name] has a diagnosis of vitamin D deficiency. She is currently taking vit D and is not at goal. Her last vitamin D level was 10.6 on 05/09/18. She denies nausea, vomiting, or muscle weakness.  Pre-Diabetes [name] has a diagnosis of pre-diabetes based on her elevated Hgb A1c and was informed this puts her at greater risk of developing diabetes. Her last A1c was 5.7 on 05/02/18. She is not taking metformin currently and continues to work on diet and exercise to decrease risk of diabetes. She denies polyphagia.  Hypertension Antony Blackbirdngela W Oravec is a 63 y.o. female with hypertension. Mendy's blood pressure is elevated today at 177/79 and runs 140's/80's at home. She is compliant with lisinopril/HCTZ. She is working on weight loss to help control her blood pressure with the goal of decreasing her risk of heart attack and stroke. [name] denies chest pain or shortness of breath on exertion.  ASSESSMENT AND PLAN:  Vitamin D deficiency - Plan: Vitamin D, Ergocalciferol, (DRISDOL) 1.25 MG (50000 UT) CAPS capsule  Prediabetes  Essential hypertension  Class 3 severe obesity with serious comorbidity and body mass index (BMI) of 50.0 to 59.9 in adult, unspecified obesity type (HCC)  PLAN:  Vitamin D  Deficiency [name] was informed that low vitamin D levels contributes to fatigue and are associated with obesity, breast, and colon cancer. She agrees to continue to take prescription Vit D @50 ,000 IU every week #4 with no refills and will follow up for routine testing of vitamin D, at least 2-3 times per year. She was informed of the risk of over-replacement of vitamin D and agrees to not increase her dose unless she discusses this with us first. [name] agrees to follow up in 2 weeks.  Pre-Diabetes [name] will continue to work on weight loss, exercise, and decreasing simple carbohydrates in her diet to help decrease the risk of diabetes. She was informed that eating too many simple carbohydrates or too many calories at one sitting increases the likelihood of GI side effects. [name] agreed to continue with her meal plan and to follow up with us as directed to monitor her progress.  Hypertension We discussed sodium restriction, working on healthy weight loss, and a regular exercise program as the means to achieve improved blood pressure control. We will continue to monitor her blood pressure as well as her progress with the above lifestyle modifications. She will continue her medications as prescribed and monitor her blood pressure at home. [name] agreed with this plan and agreed to follow up as directed.  Obesity [name] is currently in the action stage of change. As such, her goal is to continue with weight loss efforts. She has agreed to follow the Category  2 plan. We discussed protein substitutions when not eating out. Minahil has been instructed to continue walking 3 times per week. We discussed the following Behavioral Modification Strategies today: increasing lean protein intake and planning for success.  Zaleah has agreed to follow up with our clinic in 2 weeks. She was informed of the importance of frequent follow up visits to maximize her success with intensive lifestyle modifications for her  multiple health conditions.  ALLERGIES: Allergies  Allergen Reactions  . Ketorolac Tromethamine Shortness Of Breath and Palpitations  . Atrovent [Ipratropium] Hives and Rash  . Latex Other (See Comments)    Powdered. Confirm type of reaction with patient.    MEDICATIONS: Current Outpatient Medications on File Prior to Visit  Medication Sig Dispense Refill  . albuterol (PROVENTIL HFA;VENTOLIN HFA) 108 (90 Base) MCG/ACT inhaler Inhale 1-2 puffs into the lungs every 6 (six) hours as needed for wheezing or shortness of breath. 3 Inhaler 4  . budesonide-formoterol (SYMBICORT) 160-4.5 MCG/ACT inhaler Inhale 2 puffs into the lungs 2 (two) times daily. 3 Inhaler 4  . diclofenac (VOLTAREN) 75 MG EC tablet Take 75 mg by mouth 2 (two) times daily.   1  . DULoxetine (CYMBALTA) 60 MG capsule Take 1 capsule (60 mg total) by mouth daily. 90 capsule 2  . levocetirizine (XYZAL) 5 MG tablet Take 1 tablet (5 mg total) by mouth daily as needed for allergies.    [name] lisinopril-hydrochlorothiazide (PRINZIDE,ZESTORETIC) 20-25 MG tablet Take 1 tablet by mouth daily. 90 tablet 1  . montelukast (SINGULAIR) 10 MG tablet Take 1 tablet (10 mg total) by mouth at bedtime. (Patient taking differently: Take 10 mg by mouth daily as needed (Asthma). ) 90 tablet 1  . pantoprazole (PROTONIX) 40 MG tablet Take 1 tablet (40 mg total) by mouth daily as needed (for acid reflux).    [name] tiZANidine (ZANAFLEX) 2 MG tablet Take 1 tablet (2 mg total) by mouth every 8 (eight) hours as needed for muscle spasms. 45 tablet 1  . zolpidem (AMBIEN) 5 MG tablet 1 at bedtime if needed for sleep (Patient taking differently: Take 5 mg by mouth at bedtime as needed for sleep. ) 30 tablet 5   No current facility-administered medications on file prior to visit.     PAST MEDICAL HISTORY: Past Medical History:  Diagnosis Date  . Abnormal EKG 06/2003   History of inverted T waves V1-V3. Normal 2D echo (07/23/2003): LVEF 65%.  . Anemia    BL Hgb  11-12. Ferritin 14 - low normal (08/2007). Colonoscopy 2009 - external hemorrhoids (excellent prep). Last anemia panel (12/2010) - Iron  24, TIBC 269,  B12  316, Folate 11.9, Ferritin 73.  . Asthma   . B12 deficiency   . Back pain   . Chest pain   . Degenerative joint disease    BL knees (L>R), lumbar spine. Followed by Sports Medicine, Dr. Jennette Kettle.  . Dyspnea   . History of multiple pulmonary nodules    Incidental finding: CT Abd/ Pelvis (04/2010) - Several small lower lobe lung nodules, including one pure ground-glass pulmonary nodule measuring 8 mm in the left lower lobe.  Recommend follow-up chest CT (IV contrast preferred) in 6 months to document stability. //  CT Abd/ Pelvis (07/2010) -  3 mm RLL and  8 mm LLL nodule stable.  Other nodules unchanged, likely benign.  . Hyperlipidemia   . Hypertension   . Incarcerated ventral hernia 04/2010   Noted on CT Abd/ pelvis (04/2010). Patient now s/p  ventral hernia repair by Dr. Gerrit Friends (12/2010)  . Insomnia   . 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 Left hip pain   . Left ventricular diastolic dysfunction   . Leg edema   . Lower extremity edema    Chronic. 2D echo (2005) - EF 65%.  . Obesity   . OSA (obstructive sleep apnea)   . Palpitations   . Positive D dimer   . Right knee pain     PAST SURGICAL HISTORY: Past Surgical History:  Procedure Laterality Date  . COLONOSCOPY  2009  . COLONOSCOPY WITH PROPOFOL N/A 12/18/2017   Procedure: COLONOSCOPY WITH PROPOFOL;  Surgeon: Iva Boop, MD;  Location: [facility];  Service: Endoscopy;  Laterality: N/A;  . FOOT SURGERY  2004    left  . INCISION AND DRAINAGE ABSCESS ANAL  07/2008   I&D and debridgement of anorectal abscess  . INCISIONAL HERNIA REPAIR  12/2010   Repair of incarcerated ventral incisional hernia with Ethicon mesh patch - performed by Dr. Gerrit Friends.   . INGUINAL HERNIA REPAIR    . JOINT REPLACEMENT Left 2013   left knee  . TOTAL KNEE  ARTHROPLASTY  06/16/2011   Left TOTAL KNEE ARTHROPLASTY;  Surgeon: Kennieth Rad;  Location: [facility] OR;  Service: Orthopedics;  Laterality: Left;  left total knee arthroplasty  . TUBAL LIGATION      SOCIAL HISTORY: Social History   Tobacco Use  . Smoking status: Former Smoker    Packs/day: 0.50    Years: 20.00    Pack years: 10.00    Types: Cigarettes    Last attempt to quit: 09/10/1998    Years since quitting: 19.8  . Smokeless tobacco: Never Used  . Tobacco comment: 63 yo   Substance Use Topics  . Alcohol use: No    Alcohol/week: 0.0 standard drinks  . Drug use: No    FAMILY HISTORY: Family History  Problem Relation Age of Onset  . Diabetes Mother   . Hypertension Mother   . Stroke Mother   . Hyperlipidemia Mother   . Heart disease Mother   . Sudden death Mother   . Kidney disease Mother   . Depression Mother   . Dementia Father   . Heart disease Father   . Hyperlipidemia Father   . Sudden death Father   . Depression Father   . Hypertension Sister   . Diabetes Brother   . Hypertension Brother   . Rashes / Skin problems Maternal Grandfather   . Heart attack Neg Hx     ROS: Review of Systems  Constitutional: Negative for weight loss.  Respiratory: Negative for shortness of breath.   Cardiovascular: Negative for chest pain.  Gastrointestinal: Negative for nausea and vomiting.  Musculoskeletal:       Negative for muscle weakness.  Endo/Heme/Allergies:       Negative for polyphagia.    PHYSICAL EXAM: Blood pressure (!) 177/79, pulse 70, temperature 98.5 F (36.9 C), temperature source Oral, height 5\' 5"  (1.651 m), weight (!) 349 lb (158.3 kg), last menstrual period 09/09/2009, SpO2 99 %. Body mass index is 58.08 kg/m. Physical Exam Vitals signs reviewed.  Constitutional:      Appearance: Normal appearance. She is obese.  Cardiovascular:     Rate and Rhythm: Normal rate.  Pulmonary:     Effort: Pulmonary effort is normal.  Musculoskeletal:     Comments:  Positive for antalgic gait.  Skin:    General: Skin  is warm and dry.  Neurological:     Mental Status: She is alert and oriented to person, place, and time.  Psychiatric:        Mood and Affect: Mood normal.        Behavior: Behavior normal.     RECENT LABS AND TESTS: BMET    Component Value Date/Time   NA 141 05/09/2018 1007   K 4.6 05/09/2018 1007   CL 104 05/09/2018 1007   CO2 23 05/09/2018 1007   GLUCOSE 99 05/09/2018 1007   GLUCOSE 92 05/02/2018 1124   BUN 13 05/09/2018 1007   CREATININE 0.80 05/09/2018 1007   CREATININE 0.74 09/15/2017 1519   CALCIUM 10.1 05/09/2018 1007   GFRNONAA 80 05/09/2018 1007   GFRNONAA 81 03/21/2013 1530   GFRAA 92 05/09/2018 1007   GFRAA >89 03/21/2013 1530   Lab Results  Component Value Date   HGBA1C 5.7 05/02/2018   HGBA1C 5.3 10/25/2017   HGBA1C 5.6 12/02/2016   HGBA1C 5.7 (H) 04/02/2016   HGBA1C 5.7 05/04/2015   Lab Results  Component Value Date   INSULIN 12.2 05/09/2018   CBC    Component Value Date/Time   WBC 5.1 05/09/2018 1007   WBC 5.3 11/07/2017 1635   RBC 4.38 05/09/2018 1007   RBC 4.16 11/07/2017 1635   HGB 11.9 05/09/2018 1007   HCT 38.1 05/09/2018 1007   PLT 363.0 11/07/2017 1635   MCV 87 05/09/2018 1007   MCH 27.2 05/09/2018 1007   MCH 27.7 10/25/2017 0207   MCHC 31.2 (L) 05/09/2018 1007   MCHC 32.4 11/07/2017 1635   RDW 13.6 05/09/2018 1007   LYMPHSABS 1.7 05/09/2018 1007   MONOABS 0.4 11/07/2017 1635   EOSABS 0.3 05/09/2018 1007   BASOSABS 0.1 05/09/2018 1007   Iron/TIBC/Ferritin/ %Sat    Component Value Date/Time   IRON 47 10/25/2017 0859   TIBC 330 10/25/2017 0859   FERRITIN 15 10/25/2017 0859   IRONPCTSAT 14 10/25/2017 0859   IRONPCTSAT 17 (L) 11/17/2011 1656   Lipid Panel     Component Value Date/Time   CHOL 233 (H) 05/09/2018 1007   TRIG 95 05/09/2018 1007   HDL 47 05/09/2018 1007   CHOLHDL 4.6 10/25/2017 1208   VLDL 10 10/25/2017 1208   LDLCALC 167 (H) 05/09/2018 1007   Hepatic  Function Panel     Component Value Date/Time   PROT 7.0 05/09/2018 1007   ALBUMIN 4.3 05/09/2018 1007   AST 13 05/09/2018 1007   ALT 10 05/09/2018 1007   ALKPHOS 117 05/09/2018 1007   BILITOT 0.3 05/09/2018 1007   BILIDIR 0.1 05/03/2010 0625   IBILI 0.3 05/03/2010 0625      Component Value Date/Time   TSH 1.900 05/09/2018 1007   TSH 3.440 10/25/2017 0859   TSH 1.307 07/16/2014 1202   TSH 1.821 11/02/2012 1235   Results for AMBERIA, BAYLESS (MRN [id number]) as of 07/18/2018 09:46  Ref. Range 05/09/2018 10:07  Vitamin D, 25-Hydroxy Latest Ref Range: 30.0 - 100.0 ng/mL 10.6 (L)    OBESITY BEHAVIORAL INTERVENTION VISIT  Today's visit was # 5   Starting weight: 355 lbs Starting date: 05/09/18 Today's weight : Weight: (!) 349 lb (158.3 kg)  Today's date: 07/17/2018 Total lbs lost to date: 6  ASK: We discussed the diagnosis of obesity with [name] today and Zelina agreed to give [place] permission to discuss obesity behavioral modification therapy today.  ASSESS: Kalliope has the diagnosis of obesity and her BMI today is 58.0.  [name] is in the action stage of change.   ADVISE: [name] was educated on the multiple health risks of obesity as well as the benefit of weight loss to improve her health. She was advised of the need for long term treatment and the importance of lifestyle modifications to improve her current health and to decrease her risk of future health problems.  AGREE: Multiple dietary modification options and treatment options were discussed and [name] agreed to follow the recommendations documented in the above note.  ARRANGE: [name] was educated on the importance of frequent visits to treat obesity as outlined per CMS and USPSTF guidelines and agreed to schedule her next follow up appointment today.  I, Kirke Corinara Soares, am acting as Energy managertranscriptionist for Illinois Tool WorksDawn W. Byanca Kasper, FNP-C.  I have reviewed the above documentation for accuracy and completeness, and I agree with  the above.  - Chanie Soucek, FNP-C.

## 2018-07-31 ENCOUNTER — Telehealth (INDEPENDENT_AMBULATORY_CARE_PROVIDER_SITE_OTHER): Payer: Self-pay | Admitting: Orthopaedic Surgery

## 2018-07-31 NOTE — Telephone Encounter (Signed)
Patient called and stated that she would like to have some pain medication called in. Nothing is helping her.  Please call patient no specific medication given.

## 2018-08-01 ENCOUNTER — Encounter (INDEPENDENT_AMBULATORY_CARE_PROVIDER_SITE_OTHER): Payer: Self-pay | Admitting: Family Medicine

## 2018-08-01 ENCOUNTER — Ambulatory Visit (INDEPENDENT_AMBULATORY_CARE_PROVIDER_SITE_OTHER): Payer: Medicare Other | Admitting: Family Medicine

## 2018-08-01 VITALS — BP 148/80 | HR 76 | Temp 98.5°F | Ht 65.0 in | Wt 347.0 lb

## 2018-08-01 DIAGNOSIS — I10 Essential (primary) hypertension: Secondary | ICD-10-CM | POA: Diagnosis not present

## 2018-08-01 DIAGNOSIS — E66813 Obesity, class 3: Secondary | ICD-10-CM

## 2018-08-01 DIAGNOSIS — Z6841 Body Mass Index (BMI) 40.0 and over, adult: Secondary | ICD-10-CM

## 2018-08-01 DIAGNOSIS — E559 Vitamin D deficiency, unspecified: Secondary | ICD-10-CM

## 2018-08-01 MED ORDER — LISINOPRIL-HYDROCHLOROTHIAZIDE 20-25 MG PO TABS: 1.0000 | ORAL_TABLET | Freq: Every day | ORAL | 0 refills | Status: DC

## 2018-08-01 NOTE — Telephone Encounter (Signed)
See message.

## 2018-08-01 NOTE — Telephone Encounter (Signed)
Tramadol #[phone]

## 2018-08-02 ENCOUNTER — Other Ambulatory Visit (INDEPENDENT_AMBULATORY_CARE_PROVIDER_SITE_OTHER): Payer: Self-pay

## 2018-08-02 ENCOUNTER — Telehealth (INDEPENDENT_AMBULATORY_CARE_PROVIDER_SITE_OTHER): Payer: Self-pay | Admitting: Orthopaedic Surgery

## 2018-08-02 MED ORDER — TRAMADOL HCL 50 MG PO TABS: 50.0000 mg | ORAL_TABLET | Freq: Two times a day (BID) | ORAL | 0 refills | Status: DC | PRN

## 2018-08-02 NOTE — Telephone Encounter (Signed)
Patient called asked if she can get something for the pain in her lt hip and knee. Patient said the injection did not work. The number to contact patient is [phone]

## 2018-08-02 NOTE — Progress Notes (Signed)
Office: [phone]  /  Fax: [phone]  HPI:   Chief Complaint: OBESITY Sylwia is here to discuss her progress with her obesity treatment plan. She is on the Category 2 plan and is following her eating plan approximately 75 % of the time. She states she is exercising 0 minutes 0 times per week. Madgeline does not always eat all of her food on the plan. She often skips breakfast.  Her weight is (!) 347 lb (157.4 kg) today and has had a weight loss of 2 pounds over a period of 2 weeks since her last visit. She has lost 8 lbs since starting treatment with [place].  Hypertension ETHELL BLATCHFORD is a 63 y.o. female with hypertension. Soraiya's blood pressure is not well controlled. She reports severe knee pain, which she believes is raising her blood pressure. Her home blood pressures run 135/78. She is compliant with antihypertensives. She is working on weight loss to help control her blood pressure with the goal of decreasing her risk of heart attack and stroke. Chrishauna denies chest pain or shortness of breath.  Vitamin D deficiency Talina has a diagnosis of vitamin D deficiency. She is currently taking vit D and is not at goal. Her last vitamin D was 10.6 on 05/09/18. She denies nausea, vomiting, or muscle weakness.  ASSESSMENT AND PLAN:  Essential hypertension - Plan: lisinopril-hydrochlorothiazide (PRINZIDE,ZESTORETIC) 20-25 MG tablet  Vitamin D deficiency  Class 3 severe obesity with serious comorbidity and body mass index (BMI) of 50.0 to 59.9 in adult, unspecified obesity type (HCC)  PLAN:  Hypertension We discussed sodium restriction, working on healthy weight loss, and a regular exercise program as the means to achieve improved blood pressure control. We will continue to monitor her blood pressure as well as her progress with the above lifestyle modifications. She will continue her lisinopril-HCTZ 20-25mg  qd #30 with no refills and continue monitoring her blood pressure at home. She will  watch for signs of hypotension as she continues her lifestyle modifications. Deziyah agreed with this plan and agreed to follow up as directed in 2 weeks.  Vitamin D Deficiency Uliana was informed that low vitamin D levels contributes to fatigue and are associated with obesity, breast, and colon cancer. She agrees to continue to take prescription Vit D @50 ,000 IU every week and will follow up for routine testing of vitamin D, at least 2-3 times per year. She was informed of the risk of over-replacement of vitamin D and agrees to not increase her dose unless she discusses this with [place] first. Lolitha agreed to follow up as directed.  Obesity Tegan is currently in the action stage of change. As such, her goal is to continue with weight loss efforts. She has agreed to follow the Category 2 plan with Additional Breakfast Options. Bellamie has not been prescribed exercise at this time. We discussed the following Behavioral Modification Strategies today: no skipping meals and planning for success.  Rever has agreed to follow up with our clinic in 2 weeks. She was informed of the importance of frequent follow up visits to maximize her success with intensive lifestyle modifications for her multiple health conditions.  ALLERGIES: Allergies  Allergen Reactions  . Ketorolac Tromethamine Shortness Of Breath and Palpitations  . Atrovent [Ipratropium] Hives and Rash  . Latex Other (See Comments)    Powdered. Confirm type of reaction with patient.    MEDICATIONS: Current Outpatient Medications on File Prior to Visit  Medication Sig Dispense Refill  . albuterol (PROVENTIL HFA;VENTOLIN  HFA) 108 (90 Base) MCG/ACT inhaler Inhale 1-2 puffs into the lungs every 6 (six) hours as needed for wheezing or shortness of breath. 3 Inhaler 4  . budesonide-formoterol (SYMBICORT) 160-4.5 MCG/ACT inhaler Inhale 2 puffs into the lungs 2 (two) times daily. 3 Inhaler 4  . diclofenac (VOLTAREN) 75 MG EC tablet Take 75 mg by  mouth 2 (two) times daily.   1  . DULoxetine (CYMBALTA) 60 MG capsule Take 1 capsule (60 mg total) by mouth daily. 90 capsule 2  . levocetirizine (XYZAL) 5 MG tablet Take 1 tablet (5 mg total) by mouth daily as needed for allergies.    . montelukast (SINGULAIR) 10 MG tablet Take 1 tablet (10 mg total) by mouth at bedtime. (Patient taking differently: Take 10 mg by mouth daily as needed (Asthma). ) 90 tablet 1  . pantoprazole (PROTONIX) 40 MG tablet Take 1 tablet (40 mg total) by mouth daily as needed (for acid reflux).    [name]. tiZANidine (ZANAFLEX) 2 MG tablet Take 1 tablet (2 mg total) by mouth every 8 (eight) hours as needed for muscle spasms. 45 tablet 1  . Vitamin D, Ergocalciferol, (DRISDOL) 1.25 MG (50000 UT) CAPS capsule Take 1 capsule (50,000 Units total) by mouth every 7 (seven) days. 4 capsule 0  . zolpidem (AMBIEN) 5 MG tablet 1 at bedtime if needed for sleep (Patient taking differently: Take 5 mg by mouth at bedtime as needed for sleep. ) 30 tablet 5   No current facility-administered medications on file prior to visit.     PAST MEDICAL HISTORY: Past Medical History:  Diagnosis Date  . Abnormal EKG 06/2003   History of inverted T waves V1-V3. Normal 2D echo (07/23/2003): LVEF 65%.  . Anemia    BL Hgb 11-12. Ferritin 14 - low normal (08/2007). Colonoscopy 2009 - external hemorrhoids (excellent prep). Last anemia panel (12/2010) - Iron  24, TIBC 269,  B12  316, Folate 11.9, Ferritin 73.  . Asthma   . B12 deficiency   . Back pain   . Chest pain   . Degenerative joint disease    BL knees (L>R), lumbar spine. Followed by Sports Medicine, Dr. Jennette KettleNeal.  . Dyspnea   . History of multiple pulmonary nodules    Incidental finding: CT Abd/ Pelvis (04/2010) - Several small lower lobe lung nodules, including one pure ground-glass pulmonary nodule measuring 8 mm in the left lower lobe.  Recommend follow-up chest CT (IV contrast preferred) in 6 months to document stability. //  CT Abd/ Pelvis  (07/2010) -  3 mm RLL and  8 mm LLL nodule stable.  Other nodules unchanged, likely benign.  . Hyperlipidemia   . Hypertension   . Incarcerated ventral hernia 04/2010   Noted on CT Abd/ pelvis (04/2010). Patient now s/p ventral hernia repair by Dr. Gerrit FriendsGerkin (12/2010)  . Insomnia   . 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 Left hip pain   . Left ventricular diastolic dysfunction   . Leg edema   . Lower extremity edema    Chronic. 2D echo (2005) - EF 65%.  . Obesity   . OSA (obstructive sleep apnea)   . Palpitations   . Positive D dimer   . Right knee pain     PAST SURGICAL HISTORY: Past Surgical History:  Procedure Laterality Date  . COLONOSCOPY  2009  . COLONOSCOPY WITH PROPOFOL N/A 12/18/2017   Procedure: COLONOSCOPY WITH PROPOFOL;  Surgeon: Iva BoopGessner, Carl E, MD;  Location:  [facility];  Service: Endoscopy;  Laterality: N/A;  . FOOT SURGERY  2004    left  . INCISION AND DRAINAGE ABSCESS ANAL  07/2008   I&D and debridgement of anorectal abscess  . INCISIONAL HERNIA REPAIR  12/2010   Repair of incarcerated ventral incisional hernia with Ethicon mesh patch - performed by Dr. Gerrit FriendsGerkin.   . INGUINAL HERNIA REPAIR    . JOINT REPLACEMENT Left 2013   left knee  . TOTAL KNEE ARTHROPLASTY  06/16/2011   Left TOTAL KNEE ARTHROPLASTY;  Surgeon: Kennieth RadArthur F Carter;  Location: [facility] OR;  Service: Orthopedics;  Laterality: Left;  left total knee arthroplasty  . TUBAL LIGATION      SOCIAL HISTORY: Social History   Tobacco Use  . Smoking status: Former Smoker    Packs/day: 0.50    Years: 20.00    Pack years: 10.00    Types: Cigarettes    Last attempt to quit: 09/10/1998    Years since quitting: 19.9  . Smokeless tobacco: Never Used  . Tobacco comment: 63 yo   Substance Use Topics  . Alcohol use: No    Alcohol/week: 0.0 standard drinks  . Drug use: No    FAMILY HISTORY: Family History  Problem Relation Age of Onset  . Diabetes Mother   . Hypertension Mother    . Stroke Mother   . Hyperlipidemia Mother   . Heart disease Mother   . Sudden death Mother   . Kidney disease Mother   . Depression Mother   . Dementia Father   . Heart disease Father   . Hyperlipidemia Father   . Sudden death Father   . Depression Father   . Hypertension Sister   . Diabetes Brother   . Hypertension Brother   . Rashes / Skin problems Maternal Grandfather   . Heart attack Neg Hx    ROS: Review of Systems  Constitutional: Positive for weight loss. Negative for malaise/fatigue.  Respiratory: Negative for shortness of breath.   Cardiovascular: Negative for chest pain.  Gastrointestinal: Negative for nausea and vomiting.  Musculoskeletal:       Negative for muscle weakness. Positive for knee pain.  Endo/Heme/Allergies:       Negative for polyphagia.   PHYSICAL EXAM: Blood pressure (!) 148/80, pulse 76, temperature 98.5 F (36.9 C), temperature source Oral, height 5\' 5"  (1.651 m), weight (!) 347 lb (157.4 kg), last menstrual period 09/09/2009, SpO2 96 %. Body mass index is 57.74 kg/m. Physical Exam Vitals signs reviewed.  Constitutional:      Appearance: Normal appearance. She is obese.  Cardiovascular:     Rate and Rhythm: Normal rate.  Pulmonary:     Effort: Pulmonary effort is normal.  Musculoskeletal: Normal range of motion.  Skin:    General: Skin is warm and dry.  Neurological:     Mental Status: She is alert and oriented to person, place, and time.  Psychiatric:        Mood and Affect: Mood normal.        Behavior: Behavior normal.    RECENT LABS AND TESTS: BMET    Component Value Date/Time   NA 141 05/09/2018 1007   K 4.6 05/09/2018 1007   CL 104 05/09/2018 1007   CO2 23 05/09/2018 1007   GLUCOSE 99 05/09/2018 1007   GLUCOSE 92 05/02/2018 1124   BUN 13 05/09/2018 1007   CREATININE 0.80 05/09/2018 1007   CREATININE 0.74 09/15/2017 1519   CALCIUM 10.1 05/09/2018 1007   GFRNONAA 80  05/09/2018 1007   GFRNONAA 81 03/21/2013 1530    GFRAA 92 05/09/2018 1007   GFRAA >89 03/21/2013 1530   Lab Results  Component Value Date   HGBA1C 5.7 05/02/2018   HGBA1C 5.3 10/25/2017   HGBA1C 5.6 12/02/2016   HGBA1C 5.7 (H) 04/02/2016   HGBA1C 5.7 05/04/2015   Lab Results  Component Value Date   INSULIN 12.2 05/09/2018   CBC    Component Value Date/Time   WBC 5.1 05/09/2018 1007   WBC 5.3 11/07/2017 1635   RBC 4.38 05/09/2018 1007   RBC 4.16 11/07/2017 1635   HGB 11.9 05/09/2018 1007   HCT 38.1 05/09/2018 1007   PLT 363.0 11/07/2017 1635   MCV 87 05/09/2018 1007   MCH 27.2 05/09/2018 1007   MCH 27.7 10/25/2017 0207   MCHC 31.2 (L) 05/09/2018 1007   MCHC 32.4 11/07/2017 1635   RDW 13.6 05/09/2018 1007   LYMPHSABS 1.7 05/09/2018 1007   MONOABS 0.4 11/07/2017 1635   EOSABS 0.3 05/09/2018 1007   BASOSABS 0.1 05/09/2018 1007   Iron/TIBC/Ferritin/ %Sat    Component Value Date/Time   IRON 47 10/25/2017 0859   TIBC 330 10/25/2017 0859   FERRITIN 15 10/25/2017 0859   IRONPCTSAT 14 10/25/2017 0859   IRONPCTSAT 17 (L) 11/17/2011 1656   Lipid Panel     Component Value Date/Time   CHOL 233 (H) 05/09/2018 1007   TRIG 95 05/09/2018 1007   HDL 47 05/09/2018 1007   CHOLHDL 4.6 10/25/2017 1208   VLDL 10 10/25/2017 1208   LDLCALC 167 (H) 05/09/2018 1007   Hepatic Function Panel     Component Value Date/Time   PROT 7.0 05/09/2018 1007   ALBUMIN 4.3 05/09/2018 1007   AST 13 05/09/2018 1007   ALT 10 05/09/2018 1007   ALKPHOS 117 05/09/2018 1007   BILITOT 0.3 05/09/2018 1007   BILIDIR 0.1 05/03/2010 0625   IBILI 0.3 05/03/2010 0625      Component Value Date/Time   TSH 1.900 05/09/2018 1007   TSH 3.440 10/25/2017 0859   TSH 1.307 07/16/2014 1202   TSH 1.821 11/02/2012 1235   Results for TRAMEKA, KOOI (MRN [id number]) as of 08/02/2018 09:46  Ref. Range 05/09/2018 10:07  Vitamin D, 25-Hydroxy Latest Ref Range: 30.0 - 100.0 ng/mL 10.6 (L)    OBESITY BEHAVIORAL INTERVENTION VISIT  Today's visit was # 6    Starting weight: 355 lbs Starting date: 05/09/18 Today's weight : Weight: (!) 347 lb (157.4 kg)  Today's date: 08/01/2018 Total lbs lost to date: 8 At least 15 minutes were spent on discussing the following behavioral intervention visit.  ASK: We discussed the diagnosis of obesity with [name] today and Doua agreed to give [place] permission to discuss obesity behavioral modification therapy today.  ASSESS: Lana has the diagnosis of obesity and her BMI today is 57.7. Nedra is in the action stage of change.   ADVISE: Claudetta was educated on the multiple health risks of obesity as well as the benefit of weight loss to improve her health. She was advised of the need for long term treatment and the importance of lifestyle modifications to improve her current health and to decrease her risk of future health problems.  AGREE: Multiple dietary modification options and treatment options were discussed and Kylar agreed to follow the recommendations documented in the above note.  ARRANGE: Tressa was educated on the importance of frequent visits to treat obesity as outlined per CMS and USPSTF guidelines and agreed to schedule  her next follow up appointment today.  I, Kirke Corin, am acting as Energy manager for [employer], FNP-C.  I have reviewed the above documentation for accuracy and completeness, and I agree with the above.  -  , FNP-C.

## 2018-08-02 NOTE — Telephone Encounter (Signed)
Rx called into pharm 

## 2018-08-02 NOTE — Telephone Encounter (Signed)
SEE OTHER MSG 

## 2018-08-03 ENCOUNTER — Ambulatory Visit: Payer: Self-pay | Admitting: Family Medicine

## 2018-08-03 DIAGNOSIS — Z0289 Encounter for other administrative examinations: Secondary | ICD-10-CM

## 2018-08-06 ENCOUNTER — Encounter (INDEPENDENT_AMBULATORY_CARE_PROVIDER_SITE_OTHER): Payer: Self-pay | Admitting: Family Medicine

## 2018-08-08 ENCOUNTER — Ambulatory Visit (INDEPENDENT_AMBULATORY_CARE_PROVIDER_SITE_OTHER): Payer: Medicare Other | Admitting: Family Medicine

## 2018-08-08 ENCOUNTER — Encounter: Payer: Self-pay | Admitting: Family Medicine

## 2018-08-08 VITALS — BP 126/70 | HR 75 | Temp 98.8°F | Resp 16 | Ht 65.0 in | Wt 354.2 lb

## 2018-08-08 DIAGNOSIS — Z9989 Dependence on other enabling machines and devices: Secondary | ICD-10-CM

## 2018-08-08 DIAGNOSIS — Z6841 Body Mass Index (BMI) 40.0 and over, adult: Secondary | ICD-10-CM

## 2018-08-08 DIAGNOSIS — M545 Low back pain, unspecified: Secondary | ICD-10-CM

## 2018-08-08 DIAGNOSIS — G4733 Obstructive sleep apnea (adult) (pediatric): Secondary | ICD-10-CM

## 2018-08-08 DIAGNOSIS — F5101 Primary insomnia: Secondary | ICD-10-CM | POA: Diagnosis not present

## 2018-08-08 DIAGNOSIS — M159 Polyosteoarthritis, unspecified: Secondary | ICD-10-CM

## 2018-08-08 DIAGNOSIS — Z0000 Encounter for general adult medical examination without abnormal findings: Secondary | ICD-10-CM | POA: Diagnosis not present

## 2018-08-08 DIAGNOSIS — I10 Essential (primary) hypertension: Secondary | ICD-10-CM

## 2018-08-08 DIAGNOSIS — G8929 Other chronic pain: Secondary | ICD-10-CM

## 2018-08-08 MED ORDER — ZOSTER VAC RECOMB ADJUVANTED 50 MCG/0.5ML IM SUSR: INTRAMUSCULAR | 1 refills | Status: DC

## 2018-08-08 MED ORDER — LIRAGLUTIDE -WEIGHT MANAGEMENT 18 MG/3ML ~~LOC~~ SOPN: 3.0000 mg | PEN_INJECTOR | Freq: Every day | SUBCUTANEOUS | 1 refills | Status: DC

## 2018-08-08 NOTE — Assessment & Plan Note (Signed)
She would like to try pharmacologic treatment. After discussion of some side effects, she agrees with trying Saxenda 3 mg daily.  If insurance does not cover this medication we can try Victoza instead. Continue a healthful diet. Regular exercise difficult due to leg pain. Continue following with weight management clinic. I will see her back in 2 months.

## 2018-08-08 NOTE — Assessment & Plan Note (Signed)
BP adequately controlled. No changes in current management. Continue low salt diet. 

## 2018-08-08 NOTE — Assessment & Plan Note (Signed)
She would like to try Cymbalta again. She has Cymbalta 60 mg at home. We discussed some side effects. Continue working on weight loss. BMI needs to be under 50 before hip surgery can be considered. Follow-up in 2 months.

## 2018-08-08 NOTE — Assessment & Plan Note (Signed)
Aggravated by pain. She has Ambien 5 mg at home, she is interested in trying again. We discussed some side effects of medication. Good sleep hygiene.

## 2018-08-08 NOTE — Progress Notes (Signed)
HPI:   [name] is a 63 y.o. female, who is here today for her routine physical.  Last CPE: 2016. Medicare preventive visit in 04/2018. Last Medicare Preventive visit 09/2017.   Regular exercise 3 or more time per week:  Not consistently,  Following a healthy diet: Yes. She is following with [facility], she has noted some wt loss already.  She lives with her daughters 68(25 and 62 yo) and her husband.  Chronic medical problems: OSA,B12 deficiency, chronic back pain and OA,allergic rhinitis,and asthma among some.  Independent ADL except for transfer, she uses a cane and also has a walker she uses at home. She is having difficulty driving due to LLE pain.  Pap smear 4 years ago, 03/2015. Hx of abnormal pap smears: Denies.   Immunization History  Administered Date(s) Administered  . Influenza Split 03/29/2012  . Influenza,inj,Quad PF,6+ Mos 04/09/2013, 09/23/2014, 05/04/2015, 04/04/2016, 03/20/2017, 05/02/2018  . Pneumococcal Polysaccharide-23 03/29/2012, 04/04/2016  . Tdap 10/14/2010    Mammogram: 09/2016,she received a letter for mammogram. Colonoscopy: 12/18/17 DEXA: N/A  Hep C screening: 08/2016 NR.  Chronic problem management:   Following with ortho for lower back pain radiated to LLE. According to pt,she needs to loss wt before surgery can be consider, BMI needs to be < 50.  She is not longer on Cymbalta, which was recommended for chronic back pain and OA (left hip and knees). She has not taken medication in a couple months. Cymbalta was helping with pain.She thought it was recommended because depression. She does not recall any side effect.  HTN: Currently she is on Lisinopril 20-25 mg daily. Denies severe/frequent headache, visual changes, chest pain, dyspnea, palpitation, claudication, focal weakness, or worsening edema.  LE edema worse at the end of the day,prolonged standing or walking. Improves with LE elevation and better  first thing in the morning.   Lab Results  Component Value Date   CREATININE 0.80 05/09/2018   BUN 13 05/09/2018   NA 141 05/09/2018   K 4.6 05/09/2018   CL 104 05/09/2018   CO2 23 05/09/2018   Insomnia: She has not taken Ambien intermittently. Problem exacerbated by pain. She does not sleep well, trouble falling asleep. Ambien has helped. No side effects from meds.  OSA: She follows with Dr Holly HudsonYoung. She stopped wearing CPAP because affecting her right eye. She follows with ophthalmologists and according to pt,laser treatment was recommended in right eye, ? Retinopathy. She thinks this was caused by pressure of CPAP machine.    Review of Systems  Constitutional: Positive for fatigue. Negative for appetite change and fever.  HENT: Negative for dental problem, hearing loss, mouth sores, sore throat, trouble swallowing and voice change.   Eyes: Negative for redness and visual disturbance.  Respiratory: Negative for cough, shortness of breath and wheezing.   Cardiovascular: Positive for leg swelling. Negative for chest pain.  Gastrointestinal: Negative for abdominal pain, nausea and vomiting.       No changes in bowel habits.  Endocrine: Negative for cold intolerance, heat intolerance, polydipsia, polyphagia and polyuria.  Genitourinary: Negative for decreased urine volume, dysuria, hematuria, vaginal bleeding and vaginal discharge.  Musculoskeletal: Positive for arthralgias, back pain and gait problem.  Skin: Negative for color change and rash.  Allergic/Immunologic: Positive for environmental allergies.  Neurological: Negative for syncope, weakness and headaches.  Hematological: Negative for adenopathy. Does not bruise/bleed easily.  Psychiatric/Behavioral: Positive for sleep disturbance. Negative for confusion. The patient is not nervous/anxious.  All other systems reviewed and are negative.     Current Outpatient Medications on File Prior to Visit  Medication Sig  Dispense Refill  . albuterol (PROVENTIL HFA;VENTOLIN HFA) 108 (90 Base) MCG/ACT inhaler Inhale 1-2 puffs into the lungs every 6 (six) hours as needed for wheezing or shortness of breath. 3 Inhaler 4  . budesonide-formoterol (SYMBICORT) 160-4.5 MCG/ACT inhaler Inhale 2 puffs into the lungs 2 (two) times daily. 3 Inhaler 4  . diclofenac (VOLTAREN) 75 MG EC tablet Take 75 mg by mouth 2 (two) times daily.   1  . DULoxetine (CYMBALTA) 60 MG capsule Take 1 capsule (60 mg total) by mouth daily. 90 capsule 2  . levocetirizine (XYZAL) 5 MG tablet Take 1 tablet (5 mg total) by mouth daily as needed for allergies.    [name] lisinopril-hydrochlorothiazide (PRINZIDE,ZESTORETIC) 20-25 MG tablet Take 1 tablet by mouth daily. 30 tablet 0  . montelukast (SINGULAIR) 10 MG tablet Take 1 tablet (10 mg total) by mouth at bedtime. (Patient taking differently: Take 10 mg by mouth daily as needed (Asthma). ) 90 tablet 1  . pantoprazole (PROTONIX) 40 MG tablet Take 1 tablet (40 mg total) by mouth daily as needed (for acid reflux).    [name] tiZANidine (ZANAFLEX) 2 MG tablet Take 1 tablet (2 mg total) by mouth every 8 (eight) hours as needed for muscle spasms. 45 tablet 1  . traMADol (ULTRAM) 50 MG tablet Take 1 tablet (50 mg total) by mouth 2 (two) times daily as needed. 30 tablet 0  . Vitamin D, Ergocalciferol, (DRISDOL) 1.25 MG (50000 UT) CAPS capsule Take 1 capsule (50,000 Units total) by mouth every 7 (seven) days. 4 capsule 0  . zolpidem (AMBIEN) 5 MG tablet 1 at bedtime if needed for sleep (Patient taking differently: Take 5 mg by mouth at bedtime as needed for sleep. ) 30 tablet 5   No current facility-administered medications on file prior to visit.      Past Medical History:  Diagnosis Date  . Abnormal EKG 06/2003   History of inverted T waves V1-V3. Normal 2D echo (07/23/2003): LVEF 65%.  . Anemia    BL Hgb 11-12. Ferritin 14 - low normal (08/2007). Colonoscopy 2009 - external hemorrhoids (excellent prep). Last anemia  panel (12/2010) - Iron  24, TIBC 269,  B12  316, Folate 11.9, Ferritin 73.  . Asthma   . B12 deficiency   . Back pain   . Chest pain   . Degenerative joint disease    BL knees (L>R), lumbar spine. Followed by Sports Medicine, Dr. Jennette Kettle.  . Dyspnea   . History of multiple pulmonary nodules    Incidental finding: CT Abd/ Pelvis (04/2010) - Several small lower lobe lung nodules, including one pure ground-glass pulmonary nodule measuring 8 mm in the left lower lobe.  Recommend follow-up chest CT (IV contrast preferred) in 6 months to document stability. //  CT Abd/ Pelvis (07/2010) -  3 mm RLL and  8 mm LLL nodule stable.  Other nodules unchanged, likely benign.  . Hyperlipidemia   . Hypertension   . Incarcerated ventral hernia 04/2010   Noted on CT Abd/ pelvis (04/2010). Patient now s/p ventral hernia repair by Dr. Gerrit Friends (12/2010)  . Insomnia   . 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 Left hip pain   . Left ventricular diastolic dysfunction   . Leg edema   . Lower extremity edema    Chronic. 2D echo (2005) - EF  65%.  . Obesity   . OSA (obstructive sleep apnea)   . Palpitations   . Positive D dimer   . Right knee pain     Past Surgical History:  Procedure Laterality Date  . COLONOSCOPY  2009  . COLONOSCOPY WITH PROPOFOL N/A 12/18/2017   Procedure: COLONOSCOPY WITH PROPOFOL;  Surgeon: Iva Boop, MD;  Location: [facility];  Service: Endoscopy;  Laterality: N/A;  . FOOT SURGERY  2004    left  . INCISION AND DRAINAGE ABSCESS ANAL  07/2008   I&D and debridgement of anorectal abscess  . INCISIONAL HERNIA REPAIR  12/2010   Repair of incarcerated ventral incisional hernia with Ethicon mesh patch - performed by Dr. Gerrit Friends.   . INGUINAL HERNIA REPAIR    . JOINT REPLACEMENT Left 2013   left knee  . TOTAL KNEE ARTHROPLASTY  06/16/2011   Left TOTAL KNEE ARTHROPLASTY;  Surgeon: Kennieth Rad;  Location: [facility] OR;  Service: Orthopedics;  Laterality: Left;   left total knee arthroplasty  . TUBAL LIGATION      Allergies  Allergen Reactions  . Ketorolac Tromethamine Shortness Of Breath and Palpitations  . Atrovent [Ipratropium] Hives and Rash  . Latex Other (See Comments)    Powdered. Confirm type of reaction with patient.    Family History  Problem Relation Age of Onset  . Diabetes Mother   . Hypertension Mother   . Stroke Mother   . Hyperlipidemia Mother   . Heart disease Mother   . Sudden death Mother   . Kidney disease Mother   . Depression Mother   . Dementia Father   . Heart disease Father   . Hyperlipidemia Father   . Sudden death Father   . Depression Father   . Hypertension Sister   . Diabetes Brother   . Hypertension Brother   . Rashes / Skin problems Maternal Grandfather   . Heart attack Neg Hx     Social History   Socioeconomic History  . Marital status: Married    Spouse name: Gerarda Fraction  . Number of children: 3  . Years of education: Not on file  . Highest education level: Not on file  Occupational History  . Occupation: Stay at home  Social Needs  . Financial resource strain: Not on file  . Food insecurity:    Worry: Not on file    Inability: Not on file  . Transportation needs:    Medical: Not on file    Non-medical: Not on file  Tobacco Use  . Smoking status: Former Smoker    Packs/day: 0.50    Years: 20.00    Pack years: 10.00    Types: Cigarettes    Last attempt to quit: 09/10/1998    Years since quitting: 19.9  . Smokeless tobacco: Never Used  . Tobacco comment: 63 yo   Substance and Sexual Activity  . Alcohol use: No    Alcohol/week: 0.0 standard drinks  . Drug use: No  . Sexual activity: Yes    Partners: Male  Lifestyle  . Physical activity:    Days per week: Not on file    Minutes per session: Not on file  . Stress: Not on file  Relationships  . Social connections:    Talks on phone: Not on file    Gets together: Not on file    Attends religious service: Not on file    Active  member of club or organization: Not on file    Attends meetings  of clubs or organizations: Not on file    Relationship status: Not on file  Other Topics Concern  . Not on file  Social History Narrative   Patient is married and has 3 children   No alcohol tobacco or drug use reported     Vitals:   08/08/18 1227  BP: 126/70  Pulse: 75  Resp: 16  Temp: 98.8 F (37.1 C)  SpO2: 98%   Body mass index is 58.95 kg/m.   Wt Readings from Last 3 Encounters:  08/08/18 (!) 354 lb 4 oz (160.7 kg)  08/01/18 (!) 347 lb (157.4 kg)  07/17/18 (!) 349 lb (158.3 kg)      Physical Exam  Nursing note and vitals reviewed. Constitutional: She is oriented to person, place, and time. She appears well-developed. No distress.  HENT:  Head: Normocephalic and atraumatic.  Right Ear: Hearing, tympanic membrane, external ear and ear canal normal.  Left Ear: Hearing, tympanic membrane, external ear and ear canal normal.  Mouth/Throat: Uvula is midline, oropharynx is clear and moist and mucous membranes are normal.  Eyes: Pupils are equal, round, and reactive to light. Conjunctivae and EOM are normal.  Neck: No tracheal deviation present.  Cardiovascular: Normal rate and regular rhythm.  No murmur heard. Pulses:      Dorsalis pedis pulses are 2+ on the right side and 2+ on the left side.  Respiratory: Effort normal and breath sounds normal. No respiratory distress.  GI: Soft. She exhibits no mass. There is no hepatomegaly. There is no abdominal tenderness.  Genitourinary:    Genitourinary Comments: Deferred to next visit.   Musculoskeletal:        General: Edema (2+ pitting LE edema,bilateral.) present.     Comments: No major deformity or signs of synovitis appreciated.  Lymphadenopathy:    She has no cervical adenopathy.       Right: No supraclavicular adenopathy present.       Left: No supraclavicular adenopathy present.  Neurological: She is alert and oriented to person, place, and time. She  has normal strength. No cranial nerve deficit. Gait (assisted with a can,antalgic.) abnormal.  Reflex Scores:      Bicep reflexes are 2+ on the right side and 2+ on the left side. Skin: Skin is warm. No rash noted. No erythema.  Psychiatric: She has a normal mood and affect. Cognition and memory are normal.  Well groomed, good eye contact.      ASSESSMENT AND PLAN:  [name] was here today annual physical examination.  Diagnoses and all orders for this visit:  Routine general medical examination at a health care facility  We discussed the importance of regular physical activity and healthy diet for prevention of chronic illness and/or complications. Preventive guidelines reviewed. Vaccination is up to date. Rx for Shingrix given.  Ca++ and vit D supplementation recommended. Next CPE in a year.   Chronic low back pain, unspecified back pain laterality, unspecified whether sciatica present Continue following with Dr Roda ShuttersXu. She is working on wt loss,so she can have surgery.  Other orders -     Liraglutide -Weight Management (SAXENDA) 18 MG/3ML SOPN; Inject 3 mg into the skin daily. -     Zoster Vaccine Adjuvanted [facility](SHINGRIX) injection; 0.5 ml in muscle and repeat in 8 weeks   Morbid obesity with BMI of 50.0-59.9, adult [facility](HCC) She would like to try pharmacologic treatment. After discussion of some side effects, she agrees with trying Saxenda 3 mg daily.  If insurance  does not cover this medication we can try Victoza instead. Continue a healthful diet. Regular exercise difficult due to leg pain. Continue following with weight management clinic. I will see her back in 2 months.  Essential hypertension BP adequately controlled. No changes in current management. Continue low-salt diet.  Generalized osteoarthritis of multiple sites (left hip,knee,and lower back) She would like to try Cymbalta again. She has Cymbalta 60 mg at home. We discussed some side  effects. Continue working on weight loss. BMI needs to be under 50 before hip surgery can be considered. Follow-up in 2 months.  Insomnia Aggravated by pain. She has Ambien 5 mg at home, she is interested in trying again. We discussed some side effects of medication. Good sleep hygiene.   OSA on CPAP Adverse effects of OSA discussed. She has appt with Dr [name] to discuss options in regard to CPAP mask. Wt loss also will help.   She has had lab work recently through [employer] loss program, so no labs done today.    Return in 2 months (on 10/07/2018) for 30 min,pap.     Mariabelen Pressly G. [name], MD  [facility]. [place] office.

## 2018-08-08 NOTE — Patient Instructions (Addendum)
A few things to remember from today's visit:   Essential hypertension  Preventative health care  Chronic low back pain, unspecified back pain laterality, unspecified whether sciatica present  Routine general medical examination at a health care facility  Resume Cymbalta for your leg pain. Start Saxenda for weight loss. I will see her back in 2 months. Today you have you routine preventive visit.  At least 150 minutes of moderate exercise per week, daily brisk walking for 15-30 min is a good exercise option. Healthy diet low in saturated (animal) fats and sweets and consisting of fresh fruits and vegetables, lean meats such as fish and white chicken and whole grains.  These are some of recommendations for screening depending of age and risk factors:   - Vaccines:  Tdap vaccine every 10 years.  Shingles vaccine recommended at age 62, could be given after 62 years of age but not sure about insurance coverage.   Pneumonia vaccines:  Prevnar 13 at 65 and Pneumovax at 66. Sometimes Pneumovax is giving earlier if history of smoking, lung disease,diabetes,kidney disease among some.    Cervical cancer prevention:  Pap smear starts at 62 years of age and continues periodically until 62 years old in low risk women. Pap smear every 3 years between 7321 and 62 years old. Pap smear every 3-5 years between women 30 and older if pap smear negative and HPV screening negative.   -Breast cancer: Mammogram: There is disagreement between experts about when to start screening in low risk asymptomatic female but recent recommendations are to start screening at 7140 and not later than 62 years old , every 1-2 years and after 62 yo q 2 years. Screening is recommended until 62 years old but some women can continue screening depending of healthy issues.   Colon cancer screening: starts at 62 years old until 62 years old.    Also recommended:  1. Dental visit- Brush and floss your teeth twice daily;  visit your dentist twice a year. 2. Eye doctor- Get an eye exam at least every 2 years. 3. Helmet use- Always wear a helmet when riding a bicycle, motorcycle, rollerblading or skateboarding. 4. Safe sex- If you may be exposed to sexually transmitted infections, use a condom. 5. Seat belts- Seat belts can save your live; always wear one. 6. Smoke/Carbon Monoxide detectors- These detectors need to be installed on the appropriate level of your home. Replace batteries at least once a year. 7. Skin cancer- When out in the sun please cover up and use sunscreen 15 SPF or higher. 8. Violence- If anyone is threatening or hurting you, please tell your healthcare provider.  9. Drink alcohol in moderation- Limit alcohol intake to one drink or less per day. Never drink and drive.  Please be sure medication list is accurate. If a new problem present, please set up appointment sooner than planned today.

## 2018-08-09 ENCOUNTER — Telehealth: Payer: Self-pay | Admitting: *Deleted

## 2018-08-09 ENCOUNTER — Encounter: Payer: Self-pay | Admitting: Family Medicine

## 2018-08-09 NOTE — Telephone Encounter (Signed)
Insurance does not cover Saxenda 6mg /ml, Prior authorization needed.

## 2018-08-14 NOTE — Telephone Encounter (Signed)
Does insurance covers Victoza? This would be the other option. Thanks, BJ

## 2018-08-14 NOTE — Telephone Encounter (Signed)
Spoke with patient and she stated that if medication, Bernie Covey is put in as dx of as pre- diabetes them maybe insurance will cover. Patient also stated that she has had diarrhea since Saturday. Please advise.

## 2018-08-15 ENCOUNTER — Other Ambulatory Visit: Payer: Self-pay | Admitting: Family Medicine

## 2018-08-15 DIAGNOSIS — R269 Unspecified abnormalities of gait and mobility: Secondary | ICD-10-CM | POA: Diagnosis not present

## 2018-08-15 DIAGNOSIS — I10 Essential (primary) hypertension: Secondary | ICD-10-CM | POA: Diagnosis not present

## 2018-08-15 DIAGNOSIS — R7303 Prediabetes: Secondary | ICD-10-CM

## 2018-08-15 MED ORDER — LIRAGLUTIDE -WEIGHT MANAGEMENT 18 MG/3ML ~~LOC~~ SOPN: 3.0000 mg | PEN_INJECTOR | Freq: Every day | SUBCUTANEOUS | 1 refills | Status: DC

## 2018-08-15 NOTE — Telephone Encounter (Signed)
Saxenda prescription resent to her pharmacy, attached diagnosis of prediabetes. Thanks, BJ

## 2018-08-15 NOTE — Telephone Encounter (Signed)
Noted  

## 2018-08-15 NOTE — Addendum Note (Signed)
Addended by: [name], Blakely Gluth G on: 08/15/2018 04:53 PM   Modules accepted: Orders

## 2018-08-16 ENCOUNTER — Ambulatory Visit (INDEPENDENT_AMBULATORY_CARE_PROVIDER_SITE_OTHER): Payer: Medicare Other | Admitting: Physician Assistant

## 2018-08-16 ENCOUNTER — Encounter (INDEPENDENT_AMBULATORY_CARE_PROVIDER_SITE_OTHER): Payer: Self-pay | Admitting: Physician Assistant

## 2018-08-16 VITALS — BP 137/78 | HR 69 | Temp 98.4°F | Ht 65.0 in | Wt 343.0 lb

## 2018-08-16 DIAGNOSIS — Z6841 Body Mass Index (BMI) 40.0 and over, adult: Secondary | ICD-10-CM

## 2018-08-16 DIAGNOSIS — E7849 Other hyperlipidemia: Secondary | ICD-10-CM

## 2018-08-16 DIAGNOSIS — E8881 Metabolic syndrome: Secondary | ICD-10-CM

## 2018-08-16 DIAGNOSIS — E559 Vitamin D deficiency, unspecified: Secondary | ICD-10-CM | POA: Diagnosis not present

## 2018-08-16 NOTE — Progress Notes (Signed)
Office: [phone]  /  Fax: [phone]   HPI:   Chief Complaint: OBESITY [name] is here to discuss her progress with her obesity treatment plan. She is on the Category 2 plan with additional breakfast options and is following her eating plan approximately 50% of the time. She states she is exercising 0 minutes 0 times per week. [name] did well with weight loss. She reports that she has been doing better with getting her protein in especially at breakfast. She has had a GI virus the last 3 days and has not been able to eat all of her food. Her weight is (!) 343 lb (155.6 kg) today and has had a weight loss of 3 pounds over a period of 2 weeks since her last visit. She has lost 12 lbs since starting treatment with [place].  Vitamin D deficiency [name] has a diagnosis of Vtamin D deficiency. She is currently taking prescription Vit D and denies nausea, vomiting or muscle weakness.  Insulin Resistance [name] has a diagnosis of insulin resistance based on her elevated fasting insulin level >5. [name] does not report checking her blood sugars or having any hypoglycemic episodes.  She is not taking metformin currently and continues to work on diet and exercise to decrease risk of diabetes. No polyphagia.  Hyperlipidemia [name] has hyperlipidemia and has been trying to improve her cholesterol levels with intensive lifestyle modification including a low saturated fat diet, exercise and weight loss. She denies any chest pain. [name] is on no medication.  ASSESSMENT AND PLAN:  Vitamin D deficiency  Class 3 severe obesity with serious comorbidity and body mass index (BMI) of 50.0 to 59.9 in adult, unspecified obesity type (HCC)  PLAN:  Vitamin D Deficiency [name] was informed that low Vitamin D levels contributes to fatigue and are associated with obesity, breast, and colon cancer. She agrees to continue to take prescription Vit D @ 50,000 IU every week and will follow-up for routine testing of  Vitamin D, at least 2-3 times per year. She was informed of the risk of over-replacement of Vitamin D and agrees to not increase her dose unless she discusses this with [place] first. Labs will be drawn today. [name] agrees to follow-up with our clinic in 2 weeks.  Insulin Resistance [name] will continue to work on weight loss, exercise, and decreasing simple carbohydrates in her diet to help decrease the risk of diabetes. We dicussed metformin including benefits and risks. She was informed that eating too many simple carbohydrates or too many calories at one sitting increases the likelihood of GI side effects. [name] agreed to follow-up with [place] as directed to monitor her progress. Labs will be drawn today.  Hyperlipidemia [name] was informed of the American Heart Association Guidelines emphasizing intensive lifestyle modifications as the first line treatment for hyperlipidemia. We discussed many lifestyle modifications today in depth, and [name] will continue to work on decreasing saturated fats such as fatty red meat, butter and many fried foods. She will also increase vegetables and lean protein in her diet and continue to work on exercise and weight loss efforts. Labs will be drawn today.  Obesity [name] is currently in the action stage of change. As such, her goal is to continue with weight loss efforts. She has agreed to follow the Category 2 plan. [name] has been instructed to work up to a goal of 150 minutes of combined cardio and strengthening exercise per week for weight loss and overall health benefits. We discussed the following Behavioral Modification  Strategies today: increasing lean protein intake and work on meal planning and easy cooking plans.  [name] has agreed to follow up with our clinic in 2 weeks. She was informed of the importance of frequent follow-up visits to maximize her success with intensive lifestyle modifications for her multiple health conditions.  ALLERGIES: Allergies    Allergen Reactions   Ketorolac Tromethamine Shortness Of Breath and Palpitations   Atrovent [Ipratropium] Hives and Rash   Latex Other (See Comments)    Powdered. Confirm type of reaction with patient.    MEDICATIONS: Current Outpatient Medications on File Prior to Visit  Medication Sig Dispense Refill   albuterol (PROVENTIL HFA;VENTOLIN HFA) 108 (90 Base) MCG/ACT inhaler Inhale 1-2 puffs into the lungs every 6 (six) hours as needed for wheezing or shortness of breath. 3 Inhaler 4   budesonide-formoterol (SYMBICORT) 160-4.5 MCG/ACT inhaler Inhale 2 puffs into the lungs 2 (two) times daily. 3 Inhaler 4   diclofenac (VOLTAREN) 75 MG EC tablet Take 75 mg by mouth 2 (two) times daily.   1   DULoxetine (CYMBALTA) 60 MG capsule Take 1 capsule (60 mg total) by mouth daily. 90 capsule 2   levocetirizine (XYZAL) 5 MG tablet Take 1 tablet (5 mg total) by mouth daily as needed for allergies.     Liraglutide -Weight Management (SAXENDA) 18 MG/3ML SOPN Inject 3 mg into the skin daily. 15 mL 1   lisinopril-hydrochlorothiazide (PRINZIDE,ZESTORETIC) 20-25 MG tablet Take 1 tablet by mouth daily. 30 tablet 0   montelukast (SINGULAIR) 10 MG tablet Take 1 tablet (10 mg total) by mouth at bedtime. (Patient taking differently: Take 10 mg by mouth daily as needed (Asthma). ) 90 tablet 1   pantoprazole (PROTONIX) 40 MG tablet Take 1 tablet (40 mg total) by mouth daily as needed (for acid reflux).     tiZANidine (ZANAFLEX) 2 MG tablet Take 1 tablet (2 mg total) by mouth every 8 (eight) hours as needed for muscle spasms. 45 tablet 1   traMADol (ULTRAM) 50 MG tablet Take 1 tablet (50 mg total) by mouth 2 (two) times daily as needed. 30 tablet 0   Vitamin D, Ergocalciferol, (DRISDOL) 1.25 MG (50000 UT) CAPS capsule Take 1 capsule (50,000 Units total) by mouth every 7 (seven) days. 4 capsule 0   zolpidem (AMBIEN) 5 MG tablet 1 at bedtime if needed for sleep (Patient taking differently: Take 5 mg by mouth  at bedtime as needed for sleep. ) 30 tablet 5   Zoster Vaccine Adjuvanted [facility]) injection 0.5 ml in muscle and repeat in 8 weeks 0.5 mL 1   No current facility-administered medications on file prior to visit.     PAST MEDICAL HISTORY: Past Medical History:  Diagnosis Date   Abnormal EKG 06/2003   History of inverted T waves V1-V3. Normal 2D echo (07/23/2003): LVEF 65%.   Anemia    BL Hgb 11-12. Ferritin 14 - low normal (08/2007). Colonoscopy 2009 - external hemorrhoids (excellent prep). Last anemia panel (12/2010) - Iron  24, TIBC 269,  B12  316, Folate 11.9, Ferritin 73.   Asthma    B12 deficiency    Back pain    Chest pain    Degenerative joint disease    BL knees (L>R), lumbar spine. Followed by Sports Medicine, Dr. Jennette Kettle.   Dyspnea    History of multiple pulmonary nodules    Incidental finding: CT Abd/ Pelvis (04/2010) - Several small lower lobe lung nodules, including one pure ground-glass pulmonary nodule measuring 8 mm in  the left lower lobe.  Recommend follow-up chest CT (IV contrast preferred) in 6 months to document stability. //  CT Abd/ Pelvis (07/2010) -  3 mm RLL and  8 mm LLL nodule stable.  Other nodules unchanged, likely benign.   Hyperlipidemia    Hypertension    Incarcerated ventral hernia 04/2010   Noted on CT Abd/ pelvis (04/2010). Patient now s/p ventral hernia repair by Dr. Gerrit FriendsGerkin (12/2010)   Insomnia    Knee osteoarthritis    s/p Left total knee replacement (06/2011)   Left hip pain    Left ventricular diastolic dysfunction    Leg edema    Lower extremity edema    Chronic. 2D echo (2005) - EF 65%.   Obesity    OSA (obstructive sleep apnea)    Palpitations    Positive D dimer    Right knee pain     PAST SURGICAL HISTORY: Past Surgical History:  Procedure Laterality Date   COLONOSCOPY  2009   COLONOSCOPY WITH PROPOFOL N/A 12/18/2017   Procedure: COLONOSCOPY WITH PROPOFOL;  Surgeon: Iva BoopGessner, Carl E, MD;  Location: [facility]  ENDOSCOPY;  Service: Endoscopy;  Laterality: N/A;   FOOT SURGERY  2004    left   INCISION AND DRAINAGE ABSCESS ANAL  07/2008   I&D and debridgement of anorectal abscess   INCISIONAL HERNIA REPAIR  12/2010   Repair of incarcerated ventral incisional hernia with Ethicon mesh patch - performed by Dr. Gerrit FriendsGerkin.    INGUINAL HERNIA REPAIR     JOINT REPLACEMENT Left 2013   left knee   TOTAL KNEE ARTHROPLASTY  06/16/2011   Left TOTAL KNEE ARTHROPLASTY;  Surgeon: Kennieth RadArthur F Carter;  Location: [facility] OR;  Service: Orthopedics;  Laterality: Left;  left total knee arthroplasty   TUBAL LIGATION      SOCIAL HISTORY: Social History   Tobacco Use   Smoking status: Former Smoker    Packs/day: 0.50    Years: 20.00    Pack years: 10.00    Types: Cigarettes    Last attempt to quit: 09/10/1998    Years since quitting: 19.9   Smokeless tobacco: Never Used   Tobacco comment: 63 yo   Substance Use Topics   Alcohol use: No    Alcohol/week: 0.0 standard drinks   Drug use: No    FAMILY HISTORY: Family History  Problem Relation Age of Onset   Diabetes Mother    Hypertension Mother    Stroke Mother    Hyperlipidemia Mother    Heart disease Mother    Sudden death Mother    Kidney disease Mother    Depression Mother    Dementia Father    Heart disease Father    Hyperlipidemia Father    Sudden death Father    Depression Father    Hypertension Sister    Diabetes Brother    Hypertension Brother    Rashes / Skin problems Maternal Grandfather    Heart attack Neg Hx    ROS: Review of Systems  Constitutional: Positive for weight loss.  Gastrointestinal: Negative for nausea and vomiting.  Musculoskeletal:       Negative muscle weakness  Endo/Heme/Allergies:       Negative hypoglycemia   PHYSICAL EXAM: Blood pressure 137/78, pulse 69, temperature 98.4 F (36.9 C), temperature source Oral, height 5\' 5"  (1.651 m), weight (!) 343 lb (155.6 kg), last menstrual period  09/09/2009, SpO2 98 %. Body mass index is 57.08 kg/m. Physical Exam Vitals signs reviewed.  Constitutional:  Appearance: Normal appearance. She is obese.  Cardiovascular:     Rate and Rhythm: Normal rate.     Pulses: Normal pulses.  Pulmonary:     Effort: Pulmonary effort is normal.     Breath sounds: Normal breath sounds.  Musculoskeletal: Normal range of motion.  Skin:    General: Skin is warm and dry.  Neurological:     Mental Status: She is alert and oriented to person, place, and time.  Psychiatric:        Mood and Affect: Mood normal.        Behavior: Behavior normal.   RECENT LABS AND TESTS: BMET    Component Value Date/Time   NA 141 05/09/2018 1007   K 4.6 05/09/2018 1007   CL 104 05/09/2018 1007   CO2 23 05/09/2018 1007   GLUCOSE 99 05/09/2018 1007   GLUCOSE 92 05/02/2018 1124   BUN 13 05/09/2018 1007   CREATININE 0.80 05/09/2018 1007   CREATININE 0.74 09/15/2017 1519   CALCIUM 10.1 05/09/2018 1007   GFRNONAA 80 05/09/2018 1007   GFRNONAA 81 03/21/2013 1530   GFRAA 92 05/09/2018 1007   GFRAA >89 03/21/2013 1530   Lab Results  Component Value Date   HGBA1C 5.7 05/02/2018   HGBA1C 5.3 10/25/2017   HGBA1C 5.6 12/02/2016   HGBA1C 5.7 (H) 04/02/2016   HGBA1C 5.7 05/04/2015   Lab Results  Component Value Date   INSULIN 12.2 05/09/2018   CBC    Component Value Date/Time   WBC 5.1 05/09/2018 1007   WBC 5.3 11/07/2017 1635   RBC 4.38 05/09/2018 1007   RBC 4.16 11/07/2017 1635   HGB 11.9 05/09/2018 1007   HCT 38.1 05/09/2018 1007   PLT 363.0 11/07/2017 1635   MCV 87 05/09/2018 1007   MCH 27.2 05/09/2018 1007   MCH 27.7 10/25/2017 0207   MCHC 31.2 (L) 05/09/2018 1007   MCHC 32.4 11/07/2017 1635   RDW 13.6 05/09/2018 1007   LYMPHSABS 1.7 05/09/2018 1007   MONOABS 0.4 11/07/2017 1635   EOSABS 0.3 05/09/2018 1007   BASOSABS 0.1 05/09/2018 1007   Iron/TIBC/Ferritin/ %Sat    Component Value Date/Time   IRON 47 10/25/2017 0859   TIBC 330  10/25/2017 0859   FERRITIN 15 10/25/2017 0859   IRONPCTSAT 14 10/25/2017 0859   IRONPCTSAT 17 (L) 11/17/2011 1656   Lipid Panel     Component Value Date/Time   CHOL 233 (H) 05/09/2018 1007   TRIG 95 05/09/2018 1007   HDL 47 05/09/2018 1007   CHOLHDL 4.6 10/25/2017 1208   VLDL 10 10/25/2017 1208   LDLCALC 167 (H) 05/09/2018 1007   Hepatic Function Panel     Component Value Date/Time   PROT 7.0 05/09/2018 1007   ALBUMIN 4.3 05/09/2018 1007   AST 13 05/09/2018 1007   ALT 10 05/09/2018 1007   ALKPHOS 117 05/09/2018 1007   BILITOT 0.3 05/09/2018 1007   BILIDIR 0.1 05/03/2010 0625   IBILI 0.3 05/03/2010 0625      Component Value Date/Time   TSH 1.900 05/09/2018 1007   TSH 3.440 10/25/2017 0859   TSH 1.307 07/16/2014 1202   TSH 1.821 11/02/2012 1235   Results for GLODINE, ELICK (MRN [id number]) as of 08/16/2018 11:42  Ref. Range 05/09/2018 10:07  Vitamin D, 25-Hydroxy Latest Ref Range: 30.0 - 100.0 ng/mL 10.6 (L)   OBESITY BEHAVIORAL INTERVENTION VISIT  Today's visit was #7  Starting weight: 355 lbs Starting date: 05/09/2018 Today's weight: 343 lbs Today's date: 08/16/2018 Total  lbs lost to date: 12 At least 15 minutes were spent on discussing the following behavioral intervention visit.  ASK: We discussed the diagnosis of obesity with [name] today and [name] agreed to give [place] permission to discuss obesity behavioral modification therapy today.  ASSESS: [name] has the diagnosis of obesity and her BMI today is @ 57.08. [name] is in the action stage of change.  ADVISE: [name] was educated on the multiple health risks of obesity as well as the benefit of weight loss to improve her health. She was advised of the need for long term treatment and the importance of lifestyle modifications to improve her current health and to decrease her risk of future health problems.  AGREE: Multiple dietary modification options and treatment options were discussed and  [name]  agreed to follow the recommendations documented in the above note.  ARRANGE: [name] was educated on the importance of frequent visits to treat obesity as outlined per CMS and USPSTF guidelines and agreed to schedule her next follow up appointment today.  Fernanda Drum, am acting as transcriptionist for Alois Cliche, PA-C I, Alois Cliche, PA-C have reviewed above note and agree with its content

## 2018-08-17 ENCOUNTER — Encounter: Payer: Self-pay | Admitting: Internal Medicine

## 2018-08-17 ENCOUNTER — Telehealth: Payer: Self-pay | Admitting: *Deleted

## 2018-08-17 ENCOUNTER — Ambulatory Visit (INDEPENDENT_AMBULATORY_CARE_PROVIDER_SITE_OTHER): Payer: Medicare Other | Admitting: Internal Medicine

## 2018-08-17 VITALS — BP 136/74 | HR 62 | Ht 65.0 in | Wt 348.2 lb

## 2018-08-17 DIAGNOSIS — G4733 Obstructive sleep apnea (adult) (pediatric): Secondary | ICD-10-CM

## 2018-08-17 DIAGNOSIS — J4520 Mild intermittent asthma, uncomplicated: Secondary | ICD-10-CM | POA: Diagnosis not present

## 2018-08-17 LAB — LIPID PANEL WITH LDL/HDL RATIO
Cholesterol, Total: 195 mg/dL (ref 100–199)
HDL: 45 mg/dL (ref >39)
LDL Calculated: 133 mg/dL — ABNORMAL HIGH (ref 0–99)
LDl/HDL Ratio: 3 ratio (ref 0.0–3.2)
Triglycerides: 85 mg/dL (ref 0–149)
VLDL Cholesterol Cal: 17 mg/dL (ref 5–40)

## 2018-08-17 LAB — COMPREHENSIVE METABOLIC PANEL
ALT: 10 IU/L (ref 0–32)
AST: 11 IU/L (ref 0–40)
Albumin/Globulin Ratio: 1.4 (ref 1.2–2.2)
Albumin: 4 g/dL (ref 3.8–4.8)
Alkaline Phosphatase: 117 IU/L (ref 39–117)
BUN/Creatinine Ratio: 8 — ABNORMAL LOW (ref 12–28)
BUN: 6 mg/dL — ABNORMAL LOW (ref 8–27)
Bilirubin Total: 0.3 mg/dL (ref 0.0–1.2)
CO2: 23 mmol/L (ref 20–29)
Calcium: 9.8 mg/dL (ref 8.7–10.3)
Chloride: 100 mmol/L (ref 96–106)
Creatinine, Ser: 0.78 mg/dL (ref 0.57–1.00)
GFR calc Af Amer: 95 mL/min/{1.73_m2} (ref >59)
GFR calc non Af Amer: 82 mL/min/{1.73_m2} (ref >59)
Globulin, Total: 2.9 g/dL (ref 1.5–4.5)
Glucose: 99 mg/dL (ref 65–99)
Potassium: 4 mmol/L (ref 3.5–5.2)
Sodium: 143 mmol/L (ref 134–144)
Total Protein: 6.9 g/dL (ref 6.0–8.5)

## 2018-08-17 LAB — VITAMIN D 25 HYDROXY (VIT D DEFICIENCY, FRACTURES): Vit D, 25-Hydroxy: 22.2 ng/mL — ABNORMAL LOW (ref 30.0–100.0)

## 2018-08-17 LAB — INSULIN, RANDOM: INSULIN: 10.7 u[IU]/mL (ref 2.6–24.9)

## 2018-08-17 LAB — HEMOGLOBIN A1C
Est. average glucose Bld gHb Est-mCnc: 117 mg/dL
Hgb A1c MFr Bld: 5.7 % — ABNORMAL HIGH (ref 4.8–5.6)

## 2018-08-17 NOTE — Telephone Encounter (Signed)
Prior auth for Saxenda 18mg /61ml pen injectors sent to [email or web address].

## 2018-08-17 NOTE — Assessment & Plan Note (Signed)
She has not been using inhalers appropriately and I educated her today on my recommendations for daily maintenance use of Symbicort with as needed use of rescue inhaler.  The episode she describes sitting on the edge of the bed does not sound like asthma, but has not recurred.  If it happens again I suggest she go to the ER for evaluation.

## 2018-08-17 NOTE — Progress Notes (Signed)
HPI female former smoker followed for OSA, complicated by morbid obesity, allergic rhinitis, asthma, HBP, hyperlipidemia, diastolic dysfunction, anemia Unattended Home Sleep Test 01/19/17-AHI 8.9/hour, desaturation to 92%, body weight 339 pounds Office Spirometry 08/17/2018-mild restriction, possible mild obstruction.  FVC 2.0/75%, FEV1 1.7/79%, ratio 1.83, FEF 25-75% 2.1/101% --------------------------------------------------------------------------------------------   02/14/2018- 63 year old female former smoker followed for OSA, complicated by morbid obesity, allergic rhinitis, asthma, HBP, hyperlipidemia, diastolic dysfunction, anemia CPAP auto 5-15/Advanced -----OSA: DME: [facility]. Pt has not been able to wear CPAP due to cyst on bridge of nose-on abx and other meds to help clear it up. This has been going on for about 1 month. DL attached.  Weight today is 362 pounds Ambien 5 mg, Singulair, albuterol HFA, Symbicort 160, Xyzal She had a cyst on her left eyelid possibly related to her full facemask and had to stop using CPAP.  Cyst is now improving with antibiotics.  Download compliance 40% AHI 0.9/hour No bad asthma as she continues Symbicort.  Using rescue inhaler 2 or 3 times a week with rare need to use at nighttime. CT chest 10/25/2017- ordered and followed elsewhyere IMPRESSION: 1. No central pulmonary embolus seen. Question of filling defect within the pulmonary artery branches to the left lower lobe, though this may be artifactual in nature given limitations in the timing of the contrast bolus. 2. Small bilateral pulmonary nodules are likely benign, given stability from 2015. 3. Mild coronary artery calcification. Calcification at the mitral Valve  08/17/2018- [id number] year old female former smoker followed for OSA, complicated by morbid obesity, allergic rhinitis, asthma, HBP, hyperlipidemia, diastolic dysfunction, anemia CPAP auto 5-15/Advanced ----- Has not been using CPAP - family stress etc.   not sleeping well and feels increased SOB lately Body weight today 348 pounds Albuterol HFA, Symbicort 160, Singulair, She owns her machine, but says she was getting blisters on her face under the mask, looser fit did not seal, "bothersome".  Husband here and does say she snores loudly with some witnessed apnea.  She had recorded weight loss since her diagnostic sleep study. She has not usually felt short of breath but describes an episode last week.  While sitting up on the edge of the bed she felt "clogged" in her upper chest, short of breath, sweaty and dizzy with rapid heartbeat briefly.  Self-limited.  No chest pain. She is using her Symbicort inhaler intermittently, as needed and if it is not helpful she will try her rescue inhaler.  Medical education done. Office Spirometry 08/17/2018-mild restriction, possible mild obstruction.  FVC 2.0/75%, FEV1 1.7/79%, ratio 1.83, FEF 25-75% 2.1/101%  ROS-see HPI    "+" = positive Constitutional:    weight loss, night sweats, fevers, chills, fatigue, lassitude. HEENT:    headaches, difficulty swallowing, tooth/dental problems, sore throat,       sneezing, +itching, ear ache, nasal congestion, post nasal drip, snoring CV:    chest pain, orthopnea, PND, swelling in lower extremities, anasarca,                                                 dizziness, palpitations Resp:   +shortness of breath with exertion or at rest.                productive cough,   non-productive cough, coughing up of blood.  change in color of mucus.  + wheezing.   Skin:    rash or lesions. GI:  No-   heartburn, indigestion, abdominal pain, nausea, vomiting, diarrhea,                 change in bowel habits, loss of appetite GU: dysuria, change in color of urine, no urgency or frequency.   flank pain. MS:   +joint pain, stiffness, decreased range of motion, back pain. Neuro-     nothing unusual Psych:  change in mood or affect.  depression or anxiety.   memory  loss.  OBJ- Physical Exam General- Alert, Oriented, Affect-appropriate, Distress- none acute, + Morbidly obese,  Skin- rash-none, lesions- none, excoriation- none Lymphadenopathy- none Head- atraumatic            Eyes- Gross vision intact, PERRLA, conjunctivae and secretions clear.              Ears- Hearing, canals-normal            Nose- Clear, no-Septal dev, mucus, polyps, erosion, perforation             Throat- Mallampati II-III , mucosa clear , drainage- none, tonsils- atrophic, +upper denture. Neck- flexible , trachea midline, no stridor , thyroid nl, carotid no bruit Chest - symmetrical excursion , unlabored           Heart/CV- RRR , no murmur heard , no gallop  , no rub, nl s1 s2                           - JVD- none , edema- none, stasis changes- none, varices- none           Lung-clear, wheeze-none, cough-none,  dullness-none, rub- none           Chest wall-  Abd-  Br/ Gen/ Rectal- Not done, not indicated Extrem- cyanosis- none, clubbing, none, atrophy- none, strength- nl Neuro- grossly intact to observation

## 2018-08-17 NOTE — Telephone Encounter (Signed)
Copied from CRM [id number]. Topic: General - Other >> Aug 17, 2018  3:25 PM [name] wrote: Reason for CRM: Pt called in requesting that the prior authorization be resubmitted. Pt stated she was informed that the prior authorization had not been received.

## 2018-08-17 NOTE — Assessment & Plan Note (Signed)
Not using CPAP.  Husband describes ongoing apnea events.  Her upper denture prevents use of a fitted oral appliance.  Her original sleep study showed very mild sleep apnea and she apparently has lost weight since then. When she returns for next visit we will look again at this issue and probably schedule a new sleep study.

## 2018-08-17 NOTE — Patient Instructions (Signed)
Order- office spirometry   Dx asthma mild intermittent  Please use your Symbicort every day   Inhale 2 puffs, then rinse mouth, twice daily  Use the albuterol rescue inhaler if you need extra help during the day. This is the one you carry with you, and use if you have a bad spell with shortness of breath, wheeze.  We will talk about your sleep apnea again wen you come back next time.

## 2018-08-21 NOTE — Telephone Encounter (Signed)
Fax received from [facility] stating the request was denied and this was given to Dr Jordan's asst. 

## 2018-08-21 NOTE — Telephone Encounter (Signed)
Faxed placed on provider's desk for review.

## 2018-08-22 ENCOUNTER — Encounter: Payer: Self-pay | Admitting: Family Medicine

## 2018-08-22 ENCOUNTER — Ambulatory Visit (INDEPENDENT_AMBULATORY_CARE_PROVIDER_SITE_OTHER): Payer: Medicare Other | Admitting: Family Medicine

## 2018-08-22 VITALS — BP 126/82 | HR 79 | Temp 98.1°F | Resp 16 | Ht 65.0 in | Wt 350.5 lb

## 2018-08-22 DIAGNOSIS — M545 Low back pain, unspecified: Secondary | ICD-10-CM

## 2018-08-22 DIAGNOSIS — R079 Chest pain, unspecified: Secondary | ICD-10-CM | POA: Diagnosis not present

## 2018-08-22 DIAGNOSIS — M159 Polyosteoarthritis, unspecified: Secondary | ICD-10-CM

## 2018-08-22 DIAGNOSIS — M542 Cervicalgia: Secondary | ICD-10-CM | POA: Diagnosis not present

## 2018-08-22 DIAGNOSIS — Z6841 Body Mass Index (BMI) 40.0 and over, adult: Secondary | ICD-10-CM

## 2018-08-22 DIAGNOSIS — G8929 Other chronic pain: Secondary | ICD-10-CM

## 2018-08-22 MED ORDER — TRAMADOL HCL 50 MG PO TABS: 50.0000 mg | ORAL_TABLET | Freq: Two times a day (BID) | ORAL | 0 refills | Status: AC | PRN

## 2018-08-22 MED ORDER — DULOXETINE HCL 60 MG PO CPEP: 60.0000 mg | ORAL_CAPSULE | Freq: Every day | ORAL | 1 refills | Status: DC

## 2018-08-22 NOTE — Assessment & Plan Note (Signed)
She has noticed some improvement in regard to pain level. Continue following with orthopedist. No changes in Cymbalta 60 mg daily.

## 2018-08-22 NOTE — Assessment & Plan Note (Addendum)
She is tolerating Cymbalta 60 mg well and she feels like medication is helping, she would like to continue it. We discussed some side effects and the risk of medication interaction with tramadol. Weight loss certainly will help. No changes in diclofenac 75 mg twice daily. Follow-up in 5 months, before if needed.

## 2018-08-22 NOTE — Progress Notes (Signed)
HPI:   [name] is a 63 y.o. female, who is here today to follow on recent OV.  I saw her last on 08/08/2018 for her CPE, she also has some concerns at that time. Since her last visit she has follow-up with healthy weight and wellness clinic.  Last visit she was concerned about not be able to lose weight. She is following dietary recommendations, trying to eat small portions around 4 times per day.  States that sometimes she "forces" herself to eat even if she is not hungry. She has not been able to exercise due to chronic pain.  I did recommend Saxenda but her insurance did not approve prescription. She has not inquired about Victoza coverage, which can be used for weight loss.  She is interested in bariatric procedure, she wonders if she can have this done even though she has a hernia and has a mesh.  Chronic pain: Upper and lower back pain, knees, and left hip pain. Last visit she agrees with resume Cymbalta at 60 mg daily. She has noted improvement in general pain level with Cymbalta. She has not noted side effects.  Today she is complaining about left-sided neck pain, shoulder, and upper chest.  She wonders if it was caused by the CPAP mask. She has not had any injury. Pain happened at rest, "pulling" sensation on left-sided upper chest and neck with cervical rotation.  Pain is alleviated by rest.  Pain is 7-8/10, intermittent. No associated dyspnea, palpitations, or wheezing.  She is recovering from a URI, she has been feeling much better for the past 3 days. She has some intermittent wheezing (history of asthma).  Currently she is on diclofenac 25 mg twice daily. She also takes tramadol as needed, she has 2 tablets left.  She try Zanaflex but it did not help.  Review of Systems  Constitutional: Positive for fatigue. Negative for appetite change and fever.  HENT: Positive for postnasal drip and rhinorrhea. Negative for sinus pressure and sore throat.     Respiratory: Positive for wheezing. Negative for cough and shortness of breath.   Cardiovascular: Positive for chest pain and leg swelling (Stable.).  Gastrointestinal: Negative for abdominal pain, nausea and vomiting.  Musculoskeletal: Positive for arthralgias, back pain, gait problem and neck pain.  Allergic/Immunologic: Positive for environmental allergies.      Current Outpatient Medications on File Prior to Visit  Medication Sig Dispense Refill  . albuterol (PROVENTIL HFA;VENTOLIN HFA) 108 (90 Base) MCG/ACT inhaler Inhale 1-2 puffs into the lungs every 6 (six) hours as needed for wheezing or shortness of breath. 3 Inhaler 4  . budesonide-formoterol (SYMBICORT) 160-4.5 MCG/ACT inhaler Inhale 2 puffs into the lungs 2 (two) times daily. 3 Inhaler 4  . diclofenac (VOLTAREN) 75 MG EC tablet Take 75 mg by mouth 2 (two) times daily.   1  . levocetirizine (XYZAL) 5 MG tablet Take 1 tablet (5 mg total) by mouth daily as needed for allergies.    [name]. lisinopril-hydrochlorothiazide (PRINZIDE,ZESTORETIC) 20-25 MG tablet Take 1 tablet by mouth daily. 30 tablet 0  . montelukast (SINGULAIR) 10 MG tablet Take 1 tablet (10 mg total) by mouth at bedtime. (Patient taking differently: Take 10 mg by mouth daily as needed (Asthma). ) 90 tablet 1  . pantoprazole (PROTONIX) 40 MG tablet Take 1 tablet (40 mg total) by mouth daily as needed (for acid reflux).    [name]. tiZANidine (ZANAFLEX) 2 MG tablet Take 1 tablet (2 mg total) by mouth  every 8 (eight) hours as needed for muscle spasms. 45 tablet 1  . Vitamin D, Ergocalciferol, (DRISDOL) 1.25 MG (50000 UT) CAPS capsule Take 1 capsule (50,000 Units total) by mouth every 7 (seven) days. 4 capsule 0  . zolpidem (AMBIEN) 5 MG tablet 1 at bedtime if needed for sleep (Patient taking differently: Take 5 mg by mouth at bedtime as needed for sleep. ) 30 tablet 5  . Zoster Vaccine Adjuvanted [facility]) injection 0.5 ml in muscle and repeat in 8 weeks 0.5 mL 1   No current  facility-administered medications on file prior to visit.      Past Medical History:  Diagnosis Date  . Abnormal EKG 06/2003   History of inverted T waves V1-V3. Normal 2D echo (07/23/2003): LVEF 65%.  . Anemia    BL Hgb 11-12. Ferritin 14 - low normal (08/2007). Colonoscopy 2009 - external hemorrhoids (excellent prep). Last anemia panel (12/2010) - Iron  24, TIBC 269,  B12  316, Folate 11.9, Ferritin 73.  . Asthma   . B12 deficiency   . Back pain   . Chest pain   . Degenerative joint disease    BL knees (L>R), lumbar spine. Followed by Sports Medicine, Dr. Jennette Kettle.  . Dyspnea   . History of multiple pulmonary nodules    Incidental finding: CT Abd/ Pelvis (04/2010) - Several small lower lobe lung nodules, including one pure ground-glass pulmonary nodule measuring 8 mm in the left lower lobe.  Recommend follow-up chest CT (IV contrast preferred) in 6 months to document stability. //  CT Abd/ Pelvis (07/2010) -  3 mm RLL and  8 mm LLL nodule stable.  Other nodules unchanged, likely benign.  . Hyperlipidemia   . Hypertension   . Incarcerated ventral hernia 04/2010   Noted on CT Abd/ pelvis (04/2010). Patient now s/p ventral hernia repair by Dr. Gerrit Friends (12/2010)  . Insomnia   . 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 Left hip pain   . Left ventricular diastolic dysfunction   . Leg edema   . Lower extremity edema    Chronic. 2D echo (2005) - EF 65%.  . Obesity   . OSA (obstructive sleep apnea)   . Palpitations   . Positive D dimer   . Right knee pain    Allergies  Allergen Reactions  . Ketorolac Tromethamine Shortness Of Breath and Palpitations  . Atrovent [Ipratropium] Hives and Rash  . Latex Other (See Comments)    Powdered. Confirm type of reaction with patient.    Social History   Socioeconomic History  . Marital status: Married    Spouse name: Gerarda Fraction  . Number of children: 3  . Years of education: Not on file  . Highest education level: Not  on file  Occupational History  . Occupation: Stay at home  Social Needs  . Financial resource strain: Not on file  . Food insecurity:    Worry: Not on file    Inability: Not on file  . Transportation needs:    Medical: Not on file    Non-medical: Not on file  Tobacco Use  . Smoking status: Former Smoker    Packs/day: 0.50    Years: 20.00    Pack years: 10.00    Types: Cigarettes    Last attempt to quit: 09/10/1998    Years since quitting: 19.9  . Smokeless tobacco: Never Used  . Tobacco comment: 63 yo   Substance and Sexual Activity  .  Alcohol use: No    Alcohol/week: 0.0 standard drinks  . Drug use: No  . Sexual activity: Yes    Partners: Male  Lifestyle  . Physical activity:    Days per week: Not on file    Minutes per session: Not on file  . Stress: Not on file  Relationships  . Social connections:    Talks on phone: Not on file    Gets together: Not on file    Attends religious service: Not on file    Active member of club or organization: Not on file    Attends meetings of clubs or organizations: Not on file    Relationship status: Not on file  Other Topics Concern  . Not on file  Social History Narrative   Patient is married and has 3 children   No alcohol tobacco or drug use reported    Vitals:   08/22/18 1212  BP: 126/82  Pulse: 79  Resp: 16  Temp: 98.1 F (36.7 C)  SpO2: 98%   Body mass index is 58.33 kg/m.  Wt Readings from Last 3 Encounters:  08/22/18 (!) 350 lb 8 oz (159 kg)  08/17/18 (!) 348 lb 3.2 oz (157.9 kg)  08/16/18 (!) 343 lb (155.6 kg)    Physical Exam  Nursing note and vitals reviewed. Constitutional: She is oriented to person, place, and time. She appears well-developed. No distress.  HENT:  Head: Normocephalic and atraumatic.  Mouth/Throat: Oropharynx is clear and moist and mucous membranes are normal.  Eyes: Pupils are equal, round, and reactive to light. Conjunctivae are normal.  Cardiovascular: Normal rate and regular  rhythm.  No murmur heard. Respiratory: Effort normal and breath sounds normal. No respiratory distress.  Musculoskeletal:        General: Edema (2+ pitting LE edema, bilateral.) present.     Left shoulder: She exhibits tenderness. She exhibits normal range of motion and no swelling.     Cervical back: She exhibits tenderness and spasm.       Back:       Arms:     Comments: Tenderness upon palpation of left paraspinal cervical muscles, trapezium, and infraclavicular area. She also has pain upon right-sided head movement, no significant limitation.  Lymphadenopathy:    She has no cervical adenopathy.  Neurological: She is alert and oriented to person, place, and time. She has normal strength. No cranial nerve deficit. Gait normal.  Skin: Skin is warm. No rash noted. No erythema.  Psychiatric: Her mood appears anxious.  Well groomed, good eye contact.    ASSESSMENT AND PLAN:  Ms. Emerson was seen today for follow-up.  Diagnoses and all orders for this visit:  Orders Placed This Encounter  Procedures  . Ambulatory referral to General Surgery    Neck pain Musculoskeletal pain. Because no history of trauma, I do not think imaging is needed at this time. Recommend local massage and heat. She can take tramadol 50 mg twice daily for up to 5 days. She can continue diclofenac 75 mg twice daily. If she is still having pain in 10 to 14 days, we need to consider PT evaluation.  -     traMADol (ULTRAM) 50 MG tablet; Take 1 tablet (50 mg total) by mouth every 12 (twelve) hours as needed for up to 5 days.   Generalized osteoarthritis of multiple sites (left hip,knee,and lower back) She is tolerating Cymbalta 60 mg well and she feels like medication is helping, she would like to continue it. We  discussed some side effects and the risk of medication interaction with tramadol. Weight loss certainly will help. No changes in diclofenac 75 mg twice daily. Follow-up in 5 months, before if  needed.  Chest pain She is concerned about cardiac etiology of upper left-sided chest pulling sensation. History and findings on examination today suggests a musculoskeletal etiology. Explained that the likelihood of this chest pain being cardiac is low but never 0.  She needs to monitor for worsening symptoms. She has followed with cardiologist. Nuclear stress test in 04/2016: Low risk stress nuclear study with no ischemia or infarction, LVEF 57%, normal wall motion, mild LVE.  Echo in 10/2017 LVEF 65 to 70%, grade 1 diastolic dysfunction.  Wall motion was normal and no regional wall motion abnormalities. Clearly instructed about warning signs.  Class 3 severe obesity with serious comorbidity and body mass index (BMI) of 50.0 to 59.9 in adult [facility]) We discussed different options for weight loss, including meds and surgical procedures. Recommend continuing dietary recommendations. She is interested in exploring the possibility of bariatric surgery, referral placed. She has an appointment with weight clinic next week.  Explained that pharmacologic options are recommended for short period of time,so can interact with some oral medications. I gave her a list of pharmacologic options for weight loss, she will let me know which one her health insurance will cover.   Low back pain She has noticed some improvement in regard to pain level. Continue following with orthopedist. No changes in Cymbalta 60 mg daily.   Return in about 5 months (around 01/20/2019) for HTN,OA.   Deem Marmol G. [name], MD  [facility]. [place] office.

## 2018-08-22 NOTE — Patient Instructions (Addendum)
A few things to remember from today's visit:   Chest pain, unspecified type  Class 3 severe obesity with serious comorbidity and body mass index (BMI) of 50.0 to 59.9 in adult, unspecified obesity type (HCC) - Plan: Ambulatory referral to General Surgery  Generalized osteoarthritis of multiple sites (left hip,knee,and lower back)  Neck pain There are some pharmacologic options for weight loss.  Orlistat 120 mg 3 times per day with fat-containing meals. This medication can cause cramps, gassy feeling, stool incontinence/spotting. Rarely liver or kidney problems.  Bupropion-Naltrexone:    Liraglutide 3 mg (Saxenda or Victoza).    Caution with tramadol and Cymbalta, they both can interact. Local heat and range of motion exercises may help with left-sided neck pain. Continue following with weight clinic. Let me know if your insurance covers any of above medications for weight loss.  No changes in Cymbalta.  Please be sure medication list is accurate. If a new problem present, please set up appointment sooner than planned today.

## 2018-08-22 NOTE — Assessment & Plan Note (Signed)
We discussed different options for weight loss, including meds and surgical procedures. Recommend continuing dietary recommendations. She is interested in exploring the possibility of bariatric surgery, referral placed. She has an appointment with weight clinic next week.  Explained that pharmacologic options are recommended for short period of time,so can interact with some oral medications. I gave her a list of pharmacologic options for weight loss, she will let me know which one her health insurance will cover.

## 2018-08-22 NOTE — Assessment & Plan Note (Signed)
She is concerned about cardiac etiology of upper left-sided chest pulling sensation. History and findings on examination today suggests a musculoskeletal etiology. Explained that the likelihood of this chest pain being cardiac is low but never 0.  She needs to monitor for worsening symptoms. She has followed with cardiologist. Nuclear stress test in 04/2016: Low risk stress nuclear study with no ischemia or infarction, LVEF 57%, normal wall motion, mild LVE.  Echo in 10/2017 LVEF 65 to 70%, grade 1 diastolic dysfunction.  Wall motion was normal and no regional wall motion abnormalities. Clearly instructed about warning signs.

## 2018-08-30 ENCOUNTER — Ambulatory Visit (INDEPENDENT_AMBULATORY_CARE_PROVIDER_SITE_OTHER): Payer: Medicare Other | Admitting: Family Medicine

## 2018-08-30 ENCOUNTER — Encounter (INDEPENDENT_AMBULATORY_CARE_PROVIDER_SITE_OTHER): Payer: Self-pay | Admitting: Family Medicine

## 2018-08-30 VITALS — BP 149/73 | HR 69 | Temp 98.5°F | Ht 65.0 in | Wt 344.0 lb

## 2018-08-30 DIAGNOSIS — E559 Vitamin D deficiency, unspecified: Secondary | ICD-10-CM

## 2018-08-30 DIAGNOSIS — E538 Deficiency of other specified B group vitamins: Secondary | ICD-10-CM

## 2018-08-30 DIAGNOSIS — Z6841 Body Mass Index (BMI) 40.0 and over, adult: Secondary | ICD-10-CM | POA: Diagnosis not present

## 2018-08-31 LAB — VITAMIN B12: Vitamin B-12: 205 pg/mL — ABNORMAL LOW (ref 232–1245)

## 2018-09-03 ENCOUNTER — Encounter (INDEPENDENT_AMBULATORY_CARE_PROVIDER_SITE_OTHER): Payer: Self-pay | Admitting: Family Medicine

## 2018-09-03 NOTE — Progress Notes (Signed)
Office: [phone]  /  Fax: [phone]   HPI:   Chief Complaint: OBESITY [name] is here to discuss her progress with her obesity treatment plan. She is on the Category 2 plan and is following her eating plan approximately 60 % of the time. She states she is exercising 0 minutes 0 times per week. [name] is not getting all of her protein. She is trying to cut back on carbohydrates. She had been eating fried potatoes but has cut back on her portions. [name] is not sticking to her plan well. Her weight is (!) 344 lb (156 kg) today and has gained 1 lbs since her last visit. She has lost 11 lbs since starting treatment with us.  B12 deficiency [name] has a diagnosis of B12 insufficiency and has been on supplementation in the past but level was high at last check. She is having pain and tingling in the back of her left thigh and wonders if this is from B12 deficiency. She is not currently on B12.    Vitamin D deficiency [name] has a diagnosis of vitamin D deficiency. She is currently taking Vit D and denies nausea, vomiting or muscle weakness.She is not at goal.  ASSESSMENT AND PLAN:  B12 nutritional deficiency - Plan: Vitamin B12  Vitamin D deficiency  Class 3 severe obesity with serious comorbidity and body mass index (BMI) of 50.0 to 59.9 in adult, unspecified obesity type (HCC)  PLAN:  B12 Deficiency [name] will work on increasing B12 rich foods in her diet. B12 supplementation was not prescribed today. We will check labs today. [name] agrees to follow up with our clinic in 2-3 weeks.  Vitamin D Deficiency [name] was informed that low vitamin D levels contributes to fatigue and are associated with obesity, breast, and colon cancer. She agrees to continue to take prescription Vit D 50,000 IU every week and will follow up for routine testing of vitamin D, at least 2-3 times per year. She was informed of the risk of over-replacement of vitamin D and agrees to not increase her dose  unless she discusses this with us first. [name] agrees to follow up with our clinic in 2-3 weeks.  Obesity [name] is currently in the action stage of change. As such, her goal is to continue with weight loss efforts She has agreed to follow the Category 2 plan and journaling 300-400 calories and 30+ grams of protein daily at lunch  [name] has not been prescribed exercise at this time. We discussed the following Behavioral Modification Strategies today: increasing lean protein intake, decreasing simple carbohydrates, planning for success and keeping a strict food journal  [name] has agreed to follow up with our clinic in 2-3 weeks. She was informed of the importance of frequent follow up visits to maximize her success with intensive lifestyle modifications for her multiple health conditions.  ALLERGIES: Allergies  Allergen Reactions  . Ketorolac Tromethamine Shortness Of Breath and Palpitations  . Atrovent [Ipratropium] Hives and Rash  . Latex Other (See Comments)    Powdered. Confirm type of reaction with patient.    MEDICATIONS: Current Outpatient Medications on File Prior to Visit  Medication Sig Dispense Refill  . albuterol (PROVENTIL HFA;VENTOLIN HFA) 108 (90 Base) MCG/ACT inhaler Inhale 1-2 puffs into the lungs every 6 (six) hours as needed for wheezing or shortness of breath. 3 Inhaler 4  . budesonide-formoterol (SYMBICORT) 160-4.5 MCG/ACT inhaler Inhale 2 puffs into the lungs 2 (two) times daily. 3 Inhaler 4  . diclofenac (VOLTAREN) 75 MG  EC tablet Take 75 mg by mouth 2 (two) times daily.   1  . DULoxetine (CYMBALTA) 60 MG capsule Take 1 capsule (60 mg total) by mouth daily. 90 capsule 1  . levocetirizine (XYZAL) 5 MG tablet Take 1 tablet (5 mg total) by mouth daily as needed for allergies.    [name] lisinopril-hydrochlorothiazide (PRINZIDE,ZESTORETIC) 20-25 MG tablet Take 1 tablet by mouth daily. 30 tablet 0  . montelukast (SINGULAIR) 10 MG tablet Take 1 tablet (10 mg total) by mouth  at bedtime. (Patient taking differently: Take 10 mg by mouth daily as needed (Asthma). ) 90 tablet 1  . pantoprazole (PROTONIX) 40 MG tablet Take 1 tablet (40 mg total) by mouth daily as needed (for acid reflux).    [name] tiZANidine (ZANAFLEX) 2 MG tablet Take 1 tablet (2 mg total) by mouth every 8 (eight) hours as needed for muscle spasms. 45 tablet 1  . Vitamin D, Ergocalciferol, (DRISDOL) 1.25 MG (50000 UT) CAPS capsule Take 1 capsule (50,000 Units total) by mouth every 7 (seven) days. 4 capsule 0  . zolpidem (AMBIEN) 5 MG tablet 1 at bedtime if needed for sleep (Patient taking differently: Take 5 mg by mouth at bedtime as needed for sleep. ) 30 tablet 5  . Zoster Vaccine Adjuvanted [facility]) injection 0.5 ml in muscle and repeat in 8 weeks 0.5 mL 1   No current facility-administered medications on file prior to visit.     PAST MEDICAL HISTORY: Past Medical History:  Diagnosis Date  . Abnormal EKG 06/2003   History of inverted T waves V1-V3. Normal 2D echo (07/23/2003): LVEF 65%.  . Anemia    BL Hgb 11-12. Ferritin 14 - low normal (08/2007). Colonoscopy 2009 - external hemorrhoids (excellent prep). Last anemia panel (12/2010) - Iron  24, TIBC 269,  B12  316, Folate 11.9, Ferritin 73.  . Asthma   . B12 deficiency   . Back pain   . Chest pain   . Degenerative joint disease    BL knees (L>R), lumbar spine. Followed by Sports Medicine, Dr. Jennette Kettle.  . Dyspnea   . History of multiple pulmonary nodules    Incidental finding: CT Abd/ Pelvis (04/2010) - Several small lower lobe lung nodules, including one pure ground-glass pulmonary nodule measuring 8 mm in the left lower lobe.  Recommend follow-up chest CT (IV contrast preferred) in 6 months to document stability. //  CT Abd/ Pelvis (07/2010) -  3 mm RLL and  8 mm LLL nodule stable.  Other nodules unchanged, likely benign.  . Hyperlipidemia   . Hypertension   . Incarcerated ventral hernia 04/2010   Noted on CT Abd/ pelvis (04/2010). Patient now  s/p ventral hernia repair by Dr. Gerrit Friends (12/2010)  . Insomnia   . 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 Left hip pain   . Left ventricular diastolic dysfunction   . Leg edema   . Lower extremity edema    Chronic. 2D echo (2005) - EF 65%.  . Obesity   . OSA (obstructive sleep apnea)   . Palpitations   . Positive D dimer   . Right knee pain     PAST SURGICAL HISTORY: Past Surgical History:  Procedure Laterality Date  . COLONOSCOPY  2009  . COLONOSCOPY WITH PROPOFOL N/A 12/18/2017   Procedure: COLONOSCOPY WITH PROPOFOL;  Surgeon: Iva Boop, MD;  Location: [facility];  Service: Endoscopy;  Laterality: N/A;  . FOOT SURGERY  2004    left  .  INCISION AND DRAINAGE ABSCESS ANAL  07/2008   I&D and debridgement of anorectal abscess  . INCISIONAL HERNIA REPAIR  12/2010   Repair of incarcerated ventral incisional hernia with Ethicon mesh patch - performed by Dr. Gerrit Friends.   . INGUINAL HERNIA REPAIR    . JOINT REPLACEMENT Left 2013   left knee  . TOTAL KNEE ARTHROPLASTY  06/16/2011   Left TOTAL KNEE ARTHROPLASTY;  Surgeon: Kennieth Rad;  Location: [facility] OR;  Service: Orthopedics;  Laterality: Left;  left total knee arthroplasty  . TUBAL LIGATION      SOCIAL HISTORY: Social History   Tobacco Use  . Smoking status: Former Smoker    Packs/day: 0.50    Years: 20.00    Pack years: 10.00    Types: Cigarettes    Last attempt to quit: 09/10/1998    Years since quitting: 19.9  . Smokeless tobacco: Never Used  . Tobacco comment: 63 yo   Substance Use Topics  . Alcohol use: No    Alcohol/week: 0.0 standard drinks  . Drug use: No    FAMILY HISTORY: Family History  Problem Relation Age of Onset  . Diabetes Mother   . Hypertension Mother   . Stroke Mother   . Hyperlipidemia Mother   . Heart disease Mother   . Sudden death Mother   . Kidney disease Mother   . Depression Mother   . Dementia Father   . Heart disease Father   . Hyperlipidemia Father    . Sudden death Father   . Depression Father   . Hypertension Sister   . Diabetes Brother   . Hypertension Brother   . Rashes / Skin problems Maternal Grandfather   . Heart attack Neg Hx     ROS: Review of Systems  Constitutional: Negative for weight loss.  Gastrointestinal: Negative for nausea and vomiting.  Musculoskeletal:       Negative for muscle weakness    PHYSICAL EXAM: Blood pressure (!) 149/73, pulse 69, temperature 98.5 F (36.9 C), height 5\' 5"  (1.651 m), weight (!) 344 lb (156 kg), last menstrual period 09/09/2009, SpO2 99 %. Body mass index is 57.24 kg/m. Physical Exam Vitals signs reviewed.  Constitutional:      Appearance: Normal appearance. She is obese.  Cardiovascular:     Rate and Rhythm: Normal rate.     Pulses: Normal pulses.  Pulmonary:     Effort: Pulmonary effort is normal.  Musculoskeletal: Normal range of motion.  Skin:    General: Skin is warm and dry.  Neurological:     Mental Status: She is alert and oriented to person, place, and time.  Psychiatric:        Mood and Affect: Mood normal.        Behavior: Behavior normal.     RECENT LABS AND TESTS: BMET    Component Value Date/Time   NA 143 08/16/2018 1207   K 4.0 08/16/2018 1207   CL 100 08/16/2018 1207   CO2 23 08/16/2018 1207   GLUCOSE 99 08/16/2018 1207   GLUCOSE 92 05/02/2018 1124   BUN 6 (L) 08/16/2018 1207   CREATININE 0.78 08/16/2018 1207   CREATININE 0.74 09/15/2017 1519   CALCIUM 9.8 08/16/2018 1207   GFRNONAA 82 08/16/2018 1207   GFRNONAA 81 03/21/2013 1530   GFRAA 95 08/16/2018 1207   GFRAA >89 03/21/2013 1530   Lab Results  Component Value Date   HGBA1C 5.7 (H) 08/16/2018   HGBA1C 5.7 05/02/2018   HGBA1C 5.3 10/25/2017  HGBA1C 5.6 12/02/2016   HGBA1C 5.7 (H) 04/02/2016   Lab Results  Component Value Date   INSULIN 10.7 08/16/2018   INSULIN 12.2 05/09/2018   CBC    Component Value Date/Time   WBC 5.1 05/09/2018 1007   WBC 5.3 11/07/2017 1635    RBC 4.38 05/09/2018 1007   RBC 4.16 11/07/2017 1635   HGB 11.9 05/09/2018 1007   HCT 38.1 05/09/2018 1007   PLT 363.0 11/07/2017 1635   MCV 87 05/09/2018 1007   MCH 27.2 05/09/2018 1007   MCH 27.7 10/25/2017 0207   MCHC 31.2 (L) 05/09/2018 1007   MCHC 32.4 11/07/2017 1635   RDW 13.6 05/09/2018 1007   LYMPHSABS 1.7 05/09/2018 1007   MONOABS 0.4 11/07/2017 1635   EOSABS 0.3 05/09/2018 1007   BASOSABS 0.1 05/09/2018 1007   Iron/TIBC/Ferritin/ %Sat    Component Value Date/Time   IRON 47 10/25/2017 0859   TIBC 330 10/25/2017 0859   FERRITIN 15 10/25/2017 0859   IRONPCTSAT 14 10/25/2017 0859   IRONPCTSAT 17 (L) 11/17/2011 1656   Lipid Panel     Component Value Date/Time   CHOL 195 08/16/2018 1207   TRIG 85 08/16/2018 1207   HDL 45 08/16/2018 1207   CHOLHDL 4.6 10/25/2017 1208   VLDL 10 10/25/2017 1208   LDLCALC 133 (H) 08/16/2018 1207   Hepatic Function Panel     Component Value Date/Time   PROT 6.9 08/16/2018 1207   ALBUMIN 4.0 08/16/2018 1207   AST 11 08/16/2018 1207   ALT 10 08/16/2018 1207   ALKPHOS 117 08/16/2018 1207   BILITOT 0.3 08/16/2018 1207   BILIDIR 0.1 05/03/2010 0625   IBILI 0.3 05/03/2010 0625      Component Value Date/Time   TSH 1.900 05/09/2018 1007   TSH 3.440 10/25/2017 0859   TSH 1.307 07/16/2014 1202   TSH 1.821 11/02/2012 1235     Ref. Range 08/16/2018 12:07  Vitamin D, 25-Hydroxy Latest Ref Range: 30.0 - 100.0 ng/mL 22.2 (L)     OBESITY BEHAVIORAL INTERVENTION VISIT  Today's visit was # 8   Starting weight: 355 lbs Starting date: 05/09/2018 Today's weight : 344 lb  Today's date: 08/30/2018 Total lbs lost to date: 11    08/30/2018  BP 149/73 (A)  Temp 98.5 F (36.9 C)  Pulse 69  SpO2 99 %  Weight 344 lb (A)  Height  (1.651 m)  BMI (Calculated) 57.24   At least 15 minutes were spent on discussing the following behavioral intervention visit.  ASK: We discussed the diagnosis of obesity with [name] today and  Alezandra agreed to give [place] permission to discuss obesity behavioral modification therapy today.  ASSESS: Coraline has the diagnosis of obesity and her BMI today is 57.24 Ellyce is in the action stage of change   ADVISE: Tasharra was educated on the multiple health risks of obesity as well as the benefit of weight loss to improve her health. She was advised of the need for long term treatment and the importance of lifestyle modifications to improve her current health and to decrease her risk of future health problems.  AGREE: Multiple dietary modification options and treatment options were discussed and  Wyatt agreed to follow the recommendations documented in the above note.  ARRANGE: Billye was educated on the importance of frequent visits to treat obesity as outlined per CMS and USPSTF guidelines and agreed to schedule her next follow up appointment today.  I, Tammy Wysor, am acting as Energy manager for  [employer], FNP-C.  I have reviewed the above documentation for accuracy and completeness, and I agree with the above.  - Owen Pratte, FNP-C.

## 2018-09-04 ENCOUNTER — Other Ambulatory Visit (INDEPENDENT_AMBULATORY_CARE_PROVIDER_SITE_OTHER): Payer: Self-pay | Admitting: Family Medicine

## 2018-09-04 DIAGNOSIS — I10 Essential (primary) hypertension: Secondary | ICD-10-CM

## 2018-09-04 DIAGNOSIS — M545 Low back pain: Secondary | ICD-10-CM | POA: Diagnosis not present

## 2018-09-04 DIAGNOSIS — M25552 Pain in left hip: Secondary | ICD-10-CM | POA: Diagnosis not present

## 2018-09-05 ENCOUNTER — Encounter: Payer: Self-pay | Admitting: Family Medicine

## 2018-09-05 DIAGNOSIS — H33321 Round hole, right eye: Secondary | ICD-10-CM | POA: Diagnosis not present

## 2018-09-06 ENCOUNTER — Other Ambulatory Visit (INDEPENDENT_AMBULATORY_CARE_PROVIDER_SITE_OTHER): Payer: Self-pay | Admitting: Family Medicine

## 2018-09-06 DIAGNOSIS — I10 Essential (primary) hypertension: Secondary | ICD-10-CM

## 2018-09-07 ENCOUNTER — Other Ambulatory Visit: Payer: Self-pay | Admitting: *Deleted

## 2018-09-07 ENCOUNTER — Other Ambulatory Visit: Payer: Self-pay | Admitting: Family Medicine

## 2018-09-07 DIAGNOSIS — I10 Essential (primary) hypertension: Secondary | ICD-10-CM

## 2018-09-07 DIAGNOSIS — G8929 Other chronic pain: Secondary | ICD-10-CM

## 2018-09-07 DIAGNOSIS — M545 Low back pain, unspecified: Secondary | ICD-10-CM

## 2018-09-07 MED ORDER — DULOXETINE HCL 60 MG PO CPEP: 60.0000 mg | ORAL_CAPSULE | Freq: Every day | ORAL | 1 refills | Status: DC

## 2018-09-07 MED ORDER — LISINOPRIL-HYDROCHLOROTHIAZIDE 20-25 MG PO TABS: 1.0000 | ORAL_TABLET | Freq: Every day | ORAL | 1 refills | Status: DC

## 2018-09-07 MED ORDER — ZOLPIDEM TARTRATE 5 MG PO TABS: ORAL_TABLET | ORAL | 1 refills | Status: DC

## 2018-09-07 MED ORDER — LISINOPRIL-HYDROCHLOROTHIAZIDE 20-25 MG PO TABS: 1.0000 | ORAL_TABLET | Freq: Every day | ORAL | 0 refills | Status: DC

## 2018-09-13 DIAGNOSIS — M79609 Pain in unspecified limb: Secondary | ICD-10-CM | POA: Diagnosis not present

## 2018-09-13 DIAGNOSIS — M519 Unspecified thoracic, thoracolumbar and lumbosacral intervertebral disc disorder: Secondary | ICD-10-CM | POA: Diagnosis not present

## 2018-09-13 DIAGNOSIS — I10 Essential (primary) hypertension: Secondary | ICD-10-CM | POA: Diagnosis not present

## 2018-09-13 DIAGNOSIS — G4733 Obstructive sleep apnea (adult) (pediatric): Secondary | ICD-10-CM | POA: Diagnosis not present

## 2018-09-17 ENCOUNTER — Ambulatory Visit (INDEPENDENT_AMBULATORY_CARE_PROVIDER_SITE_OTHER): Payer: Self-pay | Admitting: Physician Assistant

## 2018-09-17 ENCOUNTER — Encounter (INDEPENDENT_AMBULATORY_CARE_PROVIDER_SITE_OTHER): Payer: Self-pay

## 2018-09-18 ENCOUNTER — Encounter: Payer: Self-pay | Admitting: Family Medicine

## 2018-09-19 ENCOUNTER — Other Ambulatory Visit: Payer: Self-pay | Admitting: Family Medicine

## 2018-09-19 MED ORDER — DICLOFENAC SODIUM 75 MG PO TBEC: 75.0000 mg | DELAYED_RELEASE_TABLET | Freq: Two times a day (BID) | ORAL | 0 refills | Status: DC | PRN

## 2018-09-27 ENCOUNTER — Telehealth: Payer: Self-pay

## 2018-09-27 NOTE — Telephone Encounter (Signed)
Author phoned pt. To reschedule AWV per provider need. Appointment rescheduled for later in the day.

## 2018-10-01 ENCOUNTER — Telehealth: Payer: Self-pay

## 2018-10-01 NOTE — Telephone Encounter (Signed)
Author phoned pt. to attempt to reschedule awv during covid-19 pandemic. Pt. Stated she has a computer with camera and is open to doing virutal awv. Appointment rescheduled for 3/27.

## 2018-10-02 ENCOUNTER — Encounter (INDEPENDENT_AMBULATORY_CARE_PROVIDER_SITE_OTHER): Payer: Self-pay

## 2018-10-02 ENCOUNTER — Ambulatory Visit: Payer: Medicare Other

## 2018-10-03 ENCOUNTER — Other Ambulatory Visit: Payer: Self-pay

## 2018-10-03 ENCOUNTER — Encounter (INDEPENDENT_AMBULATORY_CARE_PROVIDER_SITE_OTHER): Payer: Self-pay | Admitting: Family Medicine

## 2018-10-03 ENCOUNTER — Ambulatory Visit (INDEPENDENT_AMBULATORY_CARE_PROVIDER_SITE_OTHER): Payer: Medicare Other | Admitting: Family Medicine

## 2018-10-03 DIAGNOSIS — Z6841 Body Mass Index (BMI) 40.0 and over, adult: Secondary | ICD-10-CM

## 2018-10-03 DIAGNOSIS — I10 Essential (primary) hypertension: Secondary | ICD-10-CM

## 2018-10-03 DIAGNOSIS — E66813 Obesity, class 3: Secondary | ICD-10-CM

## 2018-10-03 DIAGNOSIS — E559 Vitamin D deficiency, unspecified: Secondary | ICD-10-CM

## 2018-10-03 MED ORDER — VITAMIN D (ERGOCALCIFEROL) 1.25 MG (50000 UNIT) PO CAPS: 50000.0000 [IU] | ORAL_CAPSULE | ORAL | 0 refills | Status: DC

## 2018-10-04 NOTE — Progress Notes (Signed)
Office: [phone]  /  Fax: [phone] TeleHealth Visit:  [name] has consented to this TeleHealth visit today via skype. The patient is located at home, the provider is located at the [facility] office. The participants in this visit include the listed provider and patient and provider's assistant.   HPI:   Chief Complaint: OBESITY [name] is here to discuss her progress with her obesity treatment plan. She is on the keep a food journal with 300-400 calories and 30+ grams of protein at lunch daily and follow the Category 2 plan and is following her eating plan approximately 50 % of the time. She states she is exercising 0 minutes 0 times per week. Maigen feels she has maintained her weight. She is having to deviate from her plan more due to grocery store shortages and finances. Her hip has been hurting more and she is trying to get up and move around, but very limited. She has done some emotional eating, but is working on controlling this.  We were unable to weight the patient today for this TeleHealth visit. She feels as if she has maintained her weight since her last visit. She has lost 11 lbs since starting treatment with [place].  Vitamin D Deficiency [name] has a diagnosis of vitamin D deficiency. She is stable on prescription Vit D and denies nausea, vomiting or muscle weakness.  Hypertension [name] is a 63 y.o. female with hypertension. Charley's blood pressure was good at the pain clinic (117/76) last week. She is stable on medications and denies chest pain or headaches. She is not checking blood pressure at home. She is working on weight loss to help control her blood pressure with the goal of decreasing her risk of heart attack and stroke. [name]'s blood pressure is currently controlled.  ASSESSMENT AND PLAN:  Vitamin D deficiency - Plan: Vitamin D, Ergocalciferol, (DRISDOL) 1.25 MG (50000 UT) CAPS capsule  Essential hypertension  Class 3 severe  obesity with serious comorbidity and body mass index (BMI) of 50.0 to 59.9 in adult, unspecified obesity type (HCC)  PLAN:  Vitamin D Deficiency [name] was informed that low vitamin D levels contributes to fatigue and are associated with obesity, breast, and colon cancer. [name] agrees to continue taking prescription Vit D ,000 IU every week #4 and we will refill for 1 month. She will follow up for routine testing of vitamin D, at least 2-3 times per year. She was informed of the risk of over-replacement of vitamin D and agrees to not increase her dose unless she discusses this with [place] first. [name] agrees to follow up with our clinic in 3 weeks.  Hypertension We discussed sodium restriction, working on healthy weight loss, and a regular exercise program as the means to achieve improved blood pressure control. [name] agreed with this plan and agreed to follow up as directed. We will continue to monitor her blood pressure as well as her progress with the above lifestyle modifications. She will continue her diet and medications, and she is to try to check blood pressure at home 2 to 3 times per week. She will watch for signs of hypotension as she continues her lifestyle modifications. [name] agrees to follow up with our clinic in 3 weeks.  Obesity [name] is currently in the action stage of change. As such, her goal is to continue with weight loss efforts She has agreed to keep a food journal with 300-400 calories and 30+ grams of protein at lunch daily  and follow the Category 2 plan [name] has been instructed to work up to a goal of 150 minutes of combined cardio and strengthening exercise per week for weight loss and overall health benefits. We discussed the following Behavioral Modification Strategies today: increasing lean protein intake, decreasing simple carbohydrates, work on meal planning and easy cooking plans, emotional eating strategies, ways to avoid boredom eating, ways to avoid night  time snacking, keeping healthy foods in the home, and better snacking choices   [name] has agreed to follow up with our clinic in 3 weeks. She was informed of the importance of frequent follow up visits to maximize her success with intensive lifestyle modifications for her multiple health conditions.  ALLERGIES: Allergies  Allergen Reactions  . Ketorolac Tromethamine Shortness Of Breath and Palpitations  . Atrovent [Ipratropium] Hives and Rash  . Latex Other (See Comments)    Powdered. Confirm type of reaction with patient.    MEDICATIONS: Current Outpatient Medications on File Prior to Visit  Medication Sig Dispense Refill  . albuterol (PROVENTIL HFA;VENTOLIN HFA) 108 (90 Base) MCG/ACT inhaler Inhale 1-2 puffs into the lungs every 6 (six) hours as needed for wheezing or shortness of breath. 3 Inhaler 4  . budesonide-formoterol (SYMBICORT) 160-4.5 MCG/ACT inhaler Inhale 2 puffs into the lungs 2 (two) times daily. 3 Inhaler 4  . diclofenac (VOLTAREN) 75 MG EC tablet Take 1 tablet (75 mg total) by mouth 2 (two) times daily as needed. 40 tablet 0  . DULoxetine (CYMBALTA) 60 MG capsule Take 1 capsule (60 mg total) by mouth daily. 90 capsule 1  . levocetirizine (XYZAL) 5 MG tablet Take 1 tablet (5 mg total) by mouth daily as needed for allergies.    [name] lisinopril-hydrochlorothiazide (PRINZIDE,ZESTORETIC) 20-25 MG tablet Take 1 tablet by mouth daily. 90 tablet 1  . montelukast (SINGULAIR) 10 MG tablet Take 1 tablet (10 mg total) by mouth at bedtime. (Patient taking differently: Take 10 mg by mouth daily as needed (Asthma). ) 90 tablet 1  . pantoprazole (PROTONIX) 40 MG tablet Take 1 tablet (40 mg total) by mouth daily as needed (for acid reflux).    [name] tiZANidine (ZANAFLEX) 2 MG tablet Take 1 tablet (2 mg total) by mouth every 8 (eight) hours as needed for muscle spasms. 45 tablet 1  . zolpidem (AMBIEN) 5 MG tablet 1 at bedtime if needed for sleep 15 tablet 1  . Zoster Vaccine Adjuvanted  [facility]) injection 0.5 ml in muscle and repeat in 8 weeks 0.5 mL 1   No current facility-administered medications on file prior to visit.     PAST MEDICAL HISTORY: Past Medical History:  Diagnosis Date  . Abnormal EKG 06/2003   History of inverted T waves V1-V3. Normal 2D echo (07/23/2003): LVEF 65%.  . Anemia    BL Hgb 11-12. Ferritin 14 - low normal (08/2007). Colonoscopy 2009 - external hemorrhoids (excellent prep). Last anemia panel (12/2010) - Iron  24, TIBC 269,  B12  316, Folate 11.9, Ferritin 73.  . Asthma   . B12 deficiency   . Back pain   . Chest pain   . Degenerative joint disease    BL knees (L>R), lumbar spine. Followed by Sports Medicine, Dr. Jennette Kettle.  . Dyspnea   . History of multiple pulmonary nodules    Incidental finding: CT Abd/ Pelvis (04/2010) - Several small lower lobe lung nodules, including one pure ground-glass pulmonary nodule measuring 8 mm in the left lower lobe.  Recommend follow-up chest CT (IV contrast preferred) in  6 months to document stability. //  CT Abd/ Pelvis (07/2010) -  3 mm RLL and  8 mm LLL nodule stable.  Other nodules unchanged, likely benign.  . Hyperlipidemia   . Hypertension   . Incarcerated ventral hernia 04/2010   Noted on CT Abd/ pelvis (04/2010). Patient now s/p ventral hernia repair by Dr. Gerrit Friends (12/2010)  . Insomnia   . 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 Left hip pain   . Left ventricular diastolic dysfunction   . Leg edema   . Lower extremity edema    Chronic. 2D echo (2005) - EF 65%.  . Obesity   . OSA (obstructive sleep apnea)   . Palpitations   . Positive D dimer   . Right knee pain     PAST SURGICAL HISTORY: Past Surgical History:  Procedure Laterality Date  . COLONOSCOPY  2009  . COLONOSCOPY WITH PROPOFOL N/A 12/18/2017   Procedure: COLONOSCOPY WITH PROPOFOL;  Surgeon: Iva Boop, MD;  Location: [facility];  Service: Endoscopy;  Laterality: N/A;  . FOOT SURGERY  2004     left  . INCISION AND DRAINAGE ABSCESS ANAL  07/2008   I&D and debridgement of anorectal abscess  . INCISIONAL HERNIA REPAIR  12/2010   Repair of incarcerated ventral incisional hernia with Ethicon mesh patch - performed by Dr. Gerrit Friends.   . INGUINAL HERNIA REPAIR    . JOINT REPLACEMENT Left 2013   left knee  . TOTAL KNEE ARTHROPLASTY  06/16/2011   Left TOTAL KNEE ARTHROPLASTY;  Surgeon: Kennieth Rad;  Location: [facility] OR;  Service: Orthopedics;  Laterality: Left;  left total knee arthroplasty  . TUBAL LIGATION      SOCIAL HISTORY: Social History   Tobacco Use  . Smoking status: Former Smoker    Packs/day: 0.50    Years: 20.00    Pack years: 10.00    Types: Cigarettes    Last attempt to quit: 09/10/1998    Years since quitting: 20.0  . Smokeless tobacco: Never Used  . Tobacco comment: 63 yo   Substance Use Topics  . Alcohol use: No    Alcohol/week: 0.0 standard drinks  . Drug use: No    FAMILY HISTORY: Family History  Problem Relation Age of Onset  . Diabetes Mother   . Hypertension Mother   . Stroke Mother   . Hyperlipidemia Mother   . Heart disease Mother   . Sudden death Mother   . Kidney disease Mother   . Depression Mother   . Dementia Father   . Heart disease Father   . Hyperlipidemia Father   . Sudden death Father   . Depression Father   . Hypertension Sister   . Diabetes Brother   . Hypertension Brother   . Rashes / Skin problems Maternal Grandfather   . Heart attack Neg Hx     ROS: Review of Systems  Constitutional: Negative for weight loss.  Cardiovascular: Negative for chest pain.  Gastrointestinal: Negative for nausea and vomiting.  Musculoskeletal:       Negative muscle weakness  Neurological: Negative for headaches.    PHYSICAL EXAM: Pt in no acute distress  RECENT LABS AND TESTS: BMET    Component Value Date/Time   NA 143 08/16/2018 1207   K 4.0 08/16/2018 1207   CL 100 08/16/2018 1207   CO2 23 08/16/2018 1207   GLUCOSE 99 08/16/2018  1207   GLUCOSE 92 05/02/2018 1124   BUN 6 (  L) 08/16/2018 1207   CREATININE 0.78 08/16/2018 1207   CREATININE 0.74 09/15/2017 1519   CALCIUM 9.8 08/16/2018 1207   GFRNONAA 82 08/16/2018 1207   GFRNONAA 81 03/21/2013 1530   GFRAA 95 08/16/2018 1207   GFRAA >89 03/21/2013 1530   Lab Results  Component Value Date   HGBA1C 5.7 (H) 08/16/2018   HGBA1C 5.7 05/02/2018   HGBA1C 5.3 10/25/2017   HGBA1C 5.6 12/02/2016   HGBA1C 5.7 (H) 04/02/2016   Lab Results  Component Value Date   INSULIN 10.7 08/16/2018   INSULIN 12.2 05/09/2018   CBC    Component Value Date/Time   WBC 5.1 05/09/2018 1007   WBC 5.3 11/07/2017 1635   RBC 4.38 05/09/2018 1007   RBC 4.16 11/07/2017 1635   HGB 11.9 05/09/2018 1007   HCT 38.1 05/09/2018 1007   PLT 363.0 11/07/2017 1635   MCV 87 05/09/2018 1007   MCH 27.2 05/09/2018 1007   MCH 27.7 10/25/2017 0207   MCHC 31.2 (L) 05/09/2018 1007   MCHC 32.4 11/07/2017 1635   RDW 13.6 05/09/2018 1007   LYMPHSABS 1.7 05/09/2018 1007   MONOABS 0.4 11/07/2017 1635   EOSABS 0.3 05/09/2018 1007   BASOSABS 0.1 05/09/2018 1007   Iron/TIBC/Ferritin/ %Sat    Component Value Date/Time   IRON 47 10/25/2017 0859   TIBC 330 10/25/2017 0859   FERRITIN 15 10/25/2017 0859   IRONPCTSAT 14 10/25/2017 0859   IRONPCTSAT 17 (L) 11/17/2011 1656   Lipid Panel     Component Value Date/Time   CHOL 195 08/16/2018 1207   TRIG 85 08/16/2018 1207   HDL 45 08/16/2018 1207   CHOLHDL 4.6 10/25/2017 1208   VLDL 10 10/25/2017 1208   LDLCALC 133 (H) 08/16/2018 1207   Hepatic Function Panel     Component Value Date/Time   PROT 6.9 08/16/2018 1207   ALBUMIN 4.0 08/16/2018 1207   AST 11 08/16/2018 1207   ALT 10 08/16/2018 1207   ALKPHOS 117 08/16/2018 1207   BILITOT 0.3 08/16/2018 1207   BILIDIR 0.1 05/03/2010 0625   IBILI 0.3 05/03/2010 0625      Component Value Date/Time   TSH 1.900 05/09/2018 1007   TSH 3.440 10/25/2017 0859   TSH 1.307 07/16/2014 1202   TSH 1.821  11/02/2012 1235      I, Burt Knack, am acting as Energy manager for Quillian Quince, MD I have reviewed the above documentation for accuracy and completeness, and I agree with the above. -Quillian Quince, MD

## 2018-10-04 NOTE — Progress Notes (Signed)
Subjective:   [name] is a 63 y.o. female who presents for Medicare Annual (Subsequent) preventive examination.  I connected with Lenord Fellers on 10/05/18 at 11:00 AM EDT by a video enabled telemedicine application and verified that I am speaking with the correct person using two identifiers.   Review of Systems:  No ROS.  Medicare Wellness Visit. Additional risk factors are reflected in the social history.  Cardiac Risk Factors include: hypertension;sedentary lifestyle;obesity (BMI >30kg/m2);dyslipidemia Sleep patterns: has restless sleep, largely due to L knee/hip pain. Dr. [name] to be made aware. Uses CPAP HS.  Home Safety/Smoke Alarms: Feels safe in home. Smoke alarms in place.  Living environment; residence and Firearm Safety: 2-story house, equipment: Medical laboratory scientific officer, Type: Single Point [place] and Walkers, Type: Agricultural consultant. Getting ramp installed in home. Could use modifications to help with showering. Discussed use of tub bench, grab bars, etc. Resources to be mailed to pt's address. Seat Belt Safety/Bike Helmet: Wears seat belt. No longer drives.   Female:   Pap- 03/2012, overdue, Dr. [name] notified.      Mammo- 09/2016, overdue. Order placed.      Dexa scan- none on file. Order placed.        CCS- 12/2017, not due until 12/2027.       Objective:     Vitals: BP (!) 145/77 (BP Location: Left Arm, Patient Position: Sitting, Cuff Size: Normal) Comment: home BP cuff Comment (BP Location): lower L  Pulse (!) 59   Wt (!) 343 lb (155.6 kg) Comment: per pt. report  LMP 09/09/2009   BMI 57.08 kg/m   Body mass index is 57.08 kg/m.  Advanced Directives 10/05/2018 12/18/2017 10/25/2017 09/27/2017 01/28/2017 09/25/2016 08/18/2016  Does Patient Have a Medical Advance Directive? (No Data) No No No No No No  Would patient like information on creating a medical advance directive? - No - Patient declined No - Patient declined - No - Patient declined - No - Patient declined    Tobacco Social  History   Tobacco Use  Smoking Status Former Smoker  . Packs/day: 0.50  . Years: 20.00  . Pack years: 10.00  . Types: Cigarettes  . Last attempt to quit: 09/10/1998  . Years since quitting: 20.0  Smokeless Tobacco Never Used  Tobacco Comment   63 yo      Counseling given: Not Answered Comment: 63 yo    Past Medical History:  Diagnosis Date  . Abnormal EKG 06/2003   History of inverted T waves V1-V3. Normal 2D echo (07/23/2003): LVEF 65%.  . Anemia    BL Hgb 11-12. Ferritin 14 - low normal (08/2007). Colonoscopy 2009 - external hemorrhoids (excellent prep). Last anemia panel (12/2010) - Iron  24, TIBC 269,  B12  316, Folate 11.9, Ferritin 73.  . Asthma   . B12 deficiency   . Back pain   . Chest pain   . Degenerative joint disease    BL knees (L>R), lumbar spine. Followed by Sports Medicine, Dr. Jennette Kettle.  . Dyspnea   . History of multiple pulmonary nodules    Incidental finding: CT Abd/ Pelvis (04/2010) - Several small lower lobe lung nodules, including one pure ground-glass pulmonary nodule measuring 8 mm in the left lower lobe.  Recommend follow-up chest CT (IV contrast preferred) in 6 months to document stability. //  CT Abd/ Pelvis (07/2010) -  3 mm RLL and  8 mm LLL nodule stable.  Other nodules unchanged, likely benign.  . Hyperlipidemia   . Hypertension   .  Incarcerated ventral hernia 04/2010   Noted on CT Abd/ pelvis (04/2010). Patient now s/p ventral hernia repair by Dr. Gerrit Friends (12/2010)  . Insomnia   . 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 Left hip pain   . Left ventricular diastolic dysfunction   . Leg edema   . Lower extremity edema    Chronic. 2D echo (2005) - EF 65%.  . Obesity   . OSA (obstructive sleep apnea)   . Palpitations   . Positive D dimer   . Right knee pain    Past Surgical History:  Procedure Laterality Date  . COLONOSCOPY  2009  . COLONOSCOPY WITH PROPOFOL N/A 12/18/2017   Procedure: COLONOSCOPY WITH PROPOFOL;   Surgeon: Iva Boop, MD;  Location: [facility];  Service: Endoscopy;  Laterality: N/A;  . FOOT SURGERY  2004    left  . INCISION AND DRAINAGE ABSCESS ANAL  07/2008   I&D and debridgement of anorectal abscess  . INCISIONAL HERNIA REPAIR  12/2010   Repair of incarcerated ventral incisional hernia with Ethicon mesh patch - performed by Dr. Gerrit Friends.   . INGUINAL HERNIA REPAIR    . JOINT REPLACEMENT Left 2013   left knee  . TOTAL KNEE ARTHROPLASTY  06/16/2011   Left TOTAL KNEE ARTHROPLASTY;  Surgeon: Kennieth Rad;  Location: [facility] OR;  Service: Orthopedics;  Laterality: Left;  left total knee arthroplasty  . TUBAL LIGATION     Family History  Problem Relation Age of Onset  . Diabetes Mother   . Hypertension Mother   . Stroke Mother   . Hyperlipidemia Mother   . Heart disease Mother   . Sudden death Mother   . Kidney disease Mother   . Depression Mother   . Dementia Father   . Heart disease Father   . Hyperlipidemia Father   . Sudden death Father   . Depression Father   . Hypertension Sister   . Diabetes Brother   . Hypertension Brother   . Rashes / Skin problems Maternal Grandfather   . Heart attack Neg Hx    Social History   Socioeconomic History  . Marital status: Married    Spouse name: Gerarda Fraction  . Number of children: 3  . Years of education: Not on file  . Highest education level: Not on file  Occupational History  . Occupation: Stay at home  Social Needs  . Financial resource strain: Not hard at all  . Food insecurity:    Worry: Never true    Inability: Never true  . Transportation needs:    Medical: No    Non-medical: No  Tobacco Use  . Smoking status: Former Smoker    Packs/day: 0.50    Years: 20.00    Pack years: 10.00    Types: Cigarettes    Last attempt to quit: 09/10/1998    Years since quitting: 20.0  . Smokeless tobacco: Never Used  . Tobacco comment: 63 yo   Substance and Sexual Activity  . Alcohol use: No    Alcohol/week: 0.0 standard drinks   . Drug use: No  . Sexual activity: Yes    Partners: Male  Lifestyle  . Physical activity:    Days per week: 0 days    Minutes per session: 0 min  . Stress: Only a little  Relationships  . Social connections:    Talks on phone: Three times a week    Gets together: Twice a week  Attends religious service: Not on file    Active member of club or organization: Not on file    Attends meetings of clubs or organizations: Not on file    Relationship status: Married  Other Topics Concern  . Not on file  Social History Narrative   10/05/2018: Lives with husband, daughter, and 2 grandchildren in [place]. Lives on main level though mostly.   Has three children altogether, all local, supportive   Currently getting ramp installed in house d/t pt difficulty getting upstairs.       Outpatient Encounter Medications as of 10/05/2018  Medication Sig  . albuterol (PROVENTIL HFA;VENTOLIN HFA) 108 (90 Base) MCG/ACT inhaler Inhale 1-2 puffs into the lungs every 6 (six) hours as needed for wheezing or shortness of breath.  . budesonide-formoterol (SYMBICORT) 160-4.5 MCG/ACT inhaler Inhale 2 puffs into the lungs 2 (two) times daily.  . diclofenac (VOLTAREN) 75 MG EC tablet Take 1 tablet (75 mg total) by mouth 2 (two) times daily as needed.  . DULoxetine (CYMBALTA) 60 MG capsule Take 1 capsule (60 mg total) by mouth daily.  [name] levocetirizine (XYZAL) 5 MG tablet Take 1 tablet (5 mg total) by mouth daily as needed for allergies.  [name] lisinopril-hydrochlorothiazide (PRINZIDE,ZESTORETIC) 20-25 MG tablet Take 1 tablet by mouth daily.  . montelukast (SINGULAIR) 10 MG tablet Take 1 tablet (10 mg total) by mouth at bedtime. (Patient taking differently: Take 10 mg by mouth daily as needed (Asthma). )  . pantoprazole (PROTONIX) 40 MG tablet Take 1 tablet (40 mg total) by mouth daily as needed (for acid reflux).  [name] tiZANidine (ZANAFLEX) 2 MG tablet Take 1 tablet (2 mg total) by mouth every 8 (eight) hours as  needed for muscle spasms.  . Vitamin D, Ergocalciferol, (DRISDOL) 1.25 MG (50000 UT) CAPS capsule Take 1 capsule (50,000 Units total) by mouth every 7 (seven) days.  [name] zolpidem (AMBIEN) 5 MG tablet 1 at bedtime if needed for sleep  . Zoster Vaccine Adjuvanted [facility]) injection 0.5 ml in muscle and repeat in 8 weeks  . [DISCONTINUED] Vitamin D, Ergocalciferol, (DRISDOL) 1.25 MG (50000 UT) CAPS capsule Take 1 capsule (50,000 Units total) by mouth every 7 (seven) days.   No facility-administered encounter medications on file as of 10/05/2018.     Activities of Daily Living In your present state of health, do you have any difficulty performing the following activities: 10/05/2018 10/25/2017  Hearing? N N  Vision? N N  Difficulty concentrating or making decisions? N N  Walking or climbing stairs? Y Y  Comment - "get out of breath"  Dressing or bathing? N N  Doing errands, shopping? Y N  Preparing Food and eating ? N -  Using the Toilet? N -  Managing your Medications? N -  Managing your Finances? N -  Housekeeping or managing your Housekeeping? N -  Comment family assists -  Some recent data might be hidden    Patient Care Team: [name], [name], MD as PCP - General (Family Medicine) Quintella Reichert, MD as PCP - Cardiology (Cardiology)    Assessment:   This is a routine wellness examination for Oriana. Physical assessment deferred to PCP.   Exercise Activities and Dietary recommendations Current Exercise Habits: The patient does not participate in regular exercise at present, Exercise limited by: cardiac condition(s) Diet (meal preparation, eat out, water intake, caffeinated beverages, dairy products, fruits and vegetables): not asked. Pt. States she is following Dr. Francena Hanly plan of care. Has lost weight since  under her care, most recent weight 343lbs per pt. Report. Still needs/desires to lose more.      Goals    . LDL CALC < 130    . Patient Stated     Sits with your mother  10: 30 to 8:30  During the week May try Dr. Dalbert Garnet, [phone]       . Patient Stated     [place]obics for joint pain relief    . Weight (lb) < 317 lb (143.8 kg)     Process of to the [facility] water aerobics       Fall Risk Fall Risk  10/05/2018 08/11/2018 09/27/2017 08/18/2016 05/13/2014  Falls in the past year? 0 1 Yes Yes No  Number falls in past yr: - 1 2 or more 2 or more -  Injury with Fall? - 0 - No -  Risk Factor Category  - - - High Fall Risk -  Risk for fall due to : History of fall(s);Impaired balance/gait;Impaired mobility;Impaired vision;Medication side effect History of fall(s);Impaired balance/gait;Impaired mobility;Orthopedic patient Impaired mobility;History of fall(s) History of fall(s);Impaired balance/gait;Impaired mobility -  Risk for fall due to: Comment - - 363lbs with knee issues and balance issues  several falls within the last few months, patient states her knee gives ou without warning -  Follow up Education provided;Falls prevention discussed Education provided (No Data) Falls prevention discussed;Education provided -  Comment - - loses her balance  - -     Depression Screen PHQ 2/9 Scores 10/05/2018 05/09/2018 09/29/2017 09/27/2017  PHQ - 2 Score 2 4 4 2   PHQ- 9 Score 8 12 15 5     Author spoke to pt. About how she is coping with recent covid-19 pandemic, and pt. Stated "OK. I stay in my house anyway". Her main concern was pain control. PHQ-9 score improved since last visit. Will let PCP know.  Cognitive Function       Ad8 score reviewed for issues:  Issues making decisions: no  Less interest in hobbies / activities: no  Repeats questions, stories (family complaining): no  Trouble using ordinary gadgets (microwave, computer, phone):no  Forgets the month or year: no  Mismanaging finances: no  Remembering appts: no  Daily problems with thinking and/or memory: no Ad8 score is= 0    Immunization History   Administered Date(s) Administered  . Influenza Split 03/29/2012  . Influenza,inj,Quad PF,6+ Mos 04/09/2013, 09/23/2014, 05/04/2015, 04/04/2016, 03/20/2017, 05/02/2018  . Pneumococcal Polysaccharide-23 03/29/2012, 04/04/2016  . Tdap 10/14/2010    Qualifies for Shingles Vaccine? Yes, education provided.   Screening Tests Health Maintenance  Topic Date Due  . MAMMOGRAM  09/09/2018  . PAP SMEAR-Modifier  01/21/2019 (Originally 03/30/2015)  . TETANUS/TDAP  10/13/2020  . COLONOSCOPY  12/19/2027  . INFLUENZA VACCINE  Completed  . Hepatitis C Screening  Completed  . HIV Screening  Completed       Plan:    Refer to pain management resources provided. Follow-up with orthopedist as planned. Will ask Dr. [name] about adjusting voltaren if possible.  Refer to diet education provided. Some of the information regarding low-inflammation diet may be helpful with your joint pain as well.  Refer to housing modification resources provided. Senior resources of [employer] may also be a good place to enquire about assistance with home needs and virtually anything!  Consider getting shingles vaccine (shingrix) at your local pharmacy (not in office as it is more expensive in office). Information provided  below. It is 90% effective at protecting you from shingles.   Our records indicate that: You had your last pap smear completed in 03/2012 and you are overdue I will remind Dr. SwazilandJordan and best to complete at 7/13 appointment if possible.     You had a mammogram 09/2016, and you are overdue. Order placed per your permission.        You have not had a bone density (Dexa) scan. Order placed per your permission.        Your last colonoscopy was 12/2017. You should not need another until 12/2027, unless otherwise indicated by PCP or GI doctor.   Order for mammogram and bone density scan placed. You should be reached by GI breast center about scheduling these in the future.   Hang in there! We are in  this together! Thank you for taking the time to do the webex visit with me!   I have personally reviewed and noted the following in the patient's chart:   . Medical and social history . Use of alcohol, tobacco or illicit drugs  . Current medications and supplements . Functional ability and status . Nutritional status . Physical activity . Advanced directives . List of other physicians . Vitals . Screenings to include cognitive, depression, and falls . Referrals and appointments  In addition, I have reviewed and discussed with patient certain preventive protocols, quality metrics, and best practice recommendations. A written personalized care plan for preventive services as well as general preventive health recommendations were provided to patient.     Brayton LaymanEmily R Mellen, RN  10/05/2018

## 2018-10-05 ENCOUNTER — Telehealth: Payer: Self-pay

## 2018-10-05 ENCOUNTER — Other Ambulatory Visit: Payer: Self-pay

## 2018-10-05 ENCOUNTER — Ambulatory Visit (INDEPENDENT_AMBULATORY_CARE_PROVIDER_SITE_OTHER): Payer: Medicare Other

## 2018-10-05 VITALS — BP 145/77 | HR 59 | Wt 343.0 lb

## 2018-10-05 DIAGNOSIS — Z0000 Encounter for general adult medical examination without abnormal findings: Secondary | ICD-10-CM

## 2018-10-05 DIAGNOSIS — Z13820 Encounter for screening for osteoporosis: Secondary | ICD-10-CM

## 2018-10-05 DIAGNOSIS — Z1239 Encounter for other screening for malignant neoplasm of breast: Secondary | ICD-10-CM | POA: Diagnosis not present

## 2018-10-05 NOTE — Telephone Encounter (Signed)
During awv, pt. expressed great concern over L hip/knee pain that has worsened over past 6 months. Pt. States diclofenac and tramadol help, but not enough. Pt. wanted to know if she could take more of either, or try something else. Routed to PCP to advise.

## 2018-10-05 NOTE — Telephone Encounter (Signed)
She has chronic history of knee pain, osteoarthritis. Currently she is following with orthopedist, who is waiting for her to lose weight so surgery can be performed. Is further concern please advise patient to follow-up with her orthopedist.  Mccabe Gloria [name], MD

## 2018-10-05 NOTE — Patient Instructions (Addendum)
[name] , Thank you for taking time to come for your Medicare Wellness Visit. I appreciate your ongoing commitment to your health goals. Please review the following plan we discussed and let me know if I can assist you in the future.   These are the goals we discussed: Goals     LDL CALC < 130     Patient Stated     Sits with your mother 10: 30 to 8:30  During the week May try Dr. Leafy Ro, [phone]        Patient Stated     [place]obics for joint pain relief     Weight (lb) < 317 lb (143.8 kg)     Process of to the [facility] water aerobics       This is a list of the screening recommended for you and due dates:  Health Maintenance  Topic Date Due   Mammogram  09/09/2018   Pap Smear  01/21/2019*   Tetanus Vaccine  10/13/2020   Colon Cancer Screening  12/19/2027   Flu Shot  Completed    Hepatitis C: One time screening is recommended by Center for Disease Control  (CDC) for  adults born from [birth year] through 1964.   Completed   HIV Screening  Completed  *Topic was postponed. The date shown is not the original due date.     Refer to pain management resources provided. Follow-up with orthopedist as planned. Will ask Dr. [name] about adjusting voltaren if possible.  Refer to diet education provided. Some of the information regarding low-inflammation diet may be helpful with your joint pain as well.  Refer to housing modification resources provided. Senior resources of [employer] may also be a good place to enquire about assistance with home needs and virtually anything!  Consider getting shingles vaccine (shingrix) at your local pharmacy (not in office as it is more expensive in office). Information provided below. It is 90% effective at protecting you from shingles.   Our records indicate that: You had your last pap smear completed in 03/2012 and you are overdue I will remind Dr. [name] and best to complete at 7/13 appointment if  possible.     You had a mammogram 09/2016, and you are overdue. Order placed per your permission.        You have not had a bone density (Dexa) scan. Order placed per your permission.        Your last colonoscopy was 12/2017. You should not need another until 12/2027, unless otherwise indicated by PCP or GI doctor.   Order for mammogram and bone density scan placed. You should be reached by GI breast center about scheduling these in the future.   Hang in there! We are in this together! Thank you for taking the time to do the webex visit with me!   Shingles  Shingles is an infection. It gives you a painful skin rash and blisters that have fluid in them. Shingles is caused by the same germ (virus) that causes chickenpox. Shingles only happens in people who:  Have had chickenpox.  Have been given a shot of medicine (vaccine) to protect against chickenpox. Shingles is rare in this group. The first symptoms of shingles may be itching, tingling, or pain in an area on your skin. A rash will show on your skin a few days or weeks later. The rash is likely to be on one side of your body. The rash usually has  a shape like a belt or a band. Over time, the rash turns into fluid-filled blisters. The blisters will break open, change into scabs, and dry up. Medicines may:  Help with pain and itching.  Help you get better sooner.  Help to prevent long-term problems. Follow these instructions at home: Medicines  Take over-the-counter and prescription medicines only as told by your doctor.  Put on an anti-itch cream or numbing cream where you have a rash, blisters, or scabs. Do this as told by your doctor. Helping with itching and discomfort   Put cold, wet cloths (cold compresses) on the area of the rash or blisters as told by your doctor.  Cool baths can help you feel better. Try adding baking soda or dry oatmeal to the water to lessen itching. Do not bathe in hot water. Blister and rash  care  Keep your rash covered with a loose bandage (dressing).  Wear loose clothing that does not rub on your rash.  Keep your rash and blisters clean. To do this, wash the area with mild soap and cool water as told by your doctor.  Check your rash every day for signs of infection. Check for: ? More redness, swelling, or pain. ? Fluid or blood. ? Warmth. ? Pus or a bad smell.  Do not scratch your rash. Do not pick at your blisters. To help you to not scratch: ? Keep your fingernails clean and cut short. ? Wear gloves or mittens when you sleep, if scratching is a problem. General instructions  Rest as told by your doctor.  Keep all follow-up visits as told by your doctor. This is important.  Wash your hands often with soap and water. If soap and water are not available, use hand sanitizer. Doing this lowers your chance of getting a skin infection caused by germs (bacteria).  Your infection can cause chickenpox in people who have never had chickenpox or never got a shot of chickenpox vaccine. If you have blisters that did not change into scabs yet, try not to touch other people or be around other people, especially: ? Babies. ? Pregnant women. ? Children who have areas of red, itchy, or rough skin (eczema). ? Very old people who have transplants. ? People who have a long-term (chronic) sickness, like cancer or AIDS. Contact a doctor if:  Your pain does not get better with medicine.  Your pain does not get better after the rash heals.  You have any signs of infection in the rash area. These signs include: ? More redness, swelling, or pain around the rash. ? Fluid or blood coming from the rash. ? The rash area feeling warm to the touch. ? Pus or a bad smell coming from the rash. Get help right away if:  The rash is on your face or nose.  You have pain in your face or pain by your eye.  You lose feeling on one side of your face.  You have trouble seeing.  You have ear  pain, or you have ringing in your ear.  You have a loss of taste.  Your condition gets worse. Summary  Shingles gives you a painful skin rash and blisters that have fluid in them.  Shingles is an infection. It is caused by the same germ (virus) that causes chickenpox.  Keep your rash covered with a loose bandage (dressing). Wear loose clothing that does not rub on your rash.  If you have blisters that did not change into scabs yet,  try not to touch other people or be around people. This information is not intended to replace advice given to you by your health care provider. Make sure you discuss any questions you have with your health care provider. Document Released: 12/14/2007 Document Revised: 03/01/2017 Document Reviewed: 03/01/2017 Elsevier Interactive Patient Education  2019 Elsevier Inc.   Zoster Vaccine, Recombinant injection What is this medicine? ZOSTER VACCINE (ZOS ter vak SEEN) is used to prevent shingles in adults 62 years old and over. This vaccine is not used to treat shingles or nerve pain from shingles. This medicine may be used for other purposes; ask your health care provider or pharmacist if you have questions. COMMON BRAND NAME(S): [facility] What should I tell my health care provider before I take this medicine? They need to know if you have any of these conditions: -blood disorders or disease -cancer like leukemia or lymphoma -immune system problems or therapy -an unusual or allergic reaction to vaccines, other medications, foods, dyes, or preservatives -pregnant or trying to get pregnant -breast-feeding How should I use this medicine? This vaccine is for injection in a muscle. It is given by a health care professional. Talk to your pediatrician regarding the use of this medicine in children. This medicine is not approved for use in children. Overdosage: If you think you have taken too much of this medicine contact a poison control center or emergency room at  once. NOTE: This medicine is only for you. Do not share this medicine with others. What if I miss a dose? Keep appointments for follow-up (booster) doses as directed. It is important not to miss your dose. Call your doctor or health care professional if you are unable to keep an appointment. What may interact with this medicine? -medicines that suppress your immune system -medicines to treat cancer -steroid medicines like prednisone or cortisone This list may not describe all possible interactions. Give your health care provider a list of all the medicines, herbs, non-prescription drugs, or dietary supplements you use. Also tell them if you smoke, drink alcohol, or use illegal drugs. Some items may interact with your medicine. What should I watch for while using this medicine? Visit your doctor for regular check ups. This vaccine, like all vaccines, may not fully protect everyone. What side effects may I notice from receiving this medicine? Side effects that you should report to your doctor or health care professional as soon as possible: -allergic reactions like skin rash, itching or hives, swelling of the face, lips, or tongue -breathing problems Side effects that usually do not require medical attention (report these to your doctor or health care professional if they continue or are bothersome): -chills -headache -fever -nausea, vomiting -redness, warmth, pain, swelling or itching at site where injected -tiredness This list may not describe all possible side effects. Call your doctor for medical advice about side effects. You may report side effects to FDA at 1-800-FDA-1088. Where should I keep my medicine? This vaccine is only given in a clinic, pharmacy, doctor's office, or other health care setting and will not be stored at home. NOTE: This sheet is a summary. It may not cover all possible information. If you have questions about this medicine, talk to your doctor, pharmacist, or  health care provider.  2019 Elsevier/Gold Standard (2017-02-06 13:20:30)   Bone Density Test The bone density test uses a special type of X-ray to measure the amount of calcium and other minerals in your bones. It can measure bone density in the hip  and the spine. The test procedure is similar to having a regular X-ray. This test may also be called:  Bone densitometry.  Bone mineral density test.  Dual-energy X-ray absorptiometry (DEXA). You may have this test to:  Diagnose a condition that causes weak or thin bones (osteoporosis).  Screen you for osteoporosis.  Predict your risk for a broken bone (fracture).  Determine how well your osteoporosis treatment is working. Tell a health care provider about:  Any allergies you have.  All medicines you are taking, including vitamins, herbs, eye drops, creams, and over-the-counter medicines.  Any problems you or family members have had with anesthetic medicines.  Any blood disorders you have.  Any surgeries you have had.  Any medical conditions you have.  Whether you are pregnant or may be pregnant.  Any medical tests you have had within the past 14 days that used contrast material. What are the risks? Generally, this is a safe procedure. However, it does expose you to a small amount of radiation, which can slightly increase your cancer risk. What happens before the procedure?  Do not take any calcium supplements starting 24 hours before your test.  Remove all metal jewelry, eyeglasses, dental appliances, and any other metal objects. What happens during the procedure?   You will lie down on an exam table. There will be an X-ray generator below you and an imaging device above you.  Other devices, such as boxes or braces, may be used to position your body properly for the scan.  The machine will slowly scan your body. You will need to keep still.  The images will show up on a screen in the room. Images will be examined by  a specialist after your test is done. The procedure may vary among health care providers and hospitals. What happens after the procedure?  It is up to you to get your test results. Ask your health care provider, or the department that is doing the test, when your results will be ready. Summary  A bone density test is an imaging test that uses a type of X-ray to measure the amount of calcium and other minerals in your bones.  The test may be used to diagnose or screen you for a condition that causes weak or thin bones (osteoporosis), predict your risk for a broken bone (fracture), or determine how well your osteoporosis treatment is working.  Do not take any calcium supplements starting 24 hours before your test.  Ask your health care provider, or the department that is doing the test, when your results will be ready. This information is not intended to replace advice given to you by your health care provider. Make sure you discuss any questions you have with your health care provider. Document Released: 07/19/2004 Document Revised: 05/01/2017 Document Reviewed: 05/01/2017 Elsevier Interactive Patient Education  2019 [place] Prevention in the Home, Adult Falls can cause injuries. They can happen to people of all ages. There are many things you can do to make your home safe and to help prevent falls. Ask for help when making these changes, if needed. What actions can I take to prevent falls? General Instructions  Use good lighting in all rooms. Replace any light bulbs that burn out.  Turn on the lights when you go into a dark area. Use night-lights.  Keep items that you use often in easy-to-reach places. Lower the shelves around your home if necessary.  Set up your furniture so you have a  clear path. Avoid moving your furniture around.  Do not have throw rugs and other things on the floor that can make you trip.  Avoid walking on wet floors.  If any of your floors are  uneven, fix them.  Add color or contrast paint or tape to clearly mark and help you see: ? Any grab bars or handrails. ? First and last steps of stairways. ? Where the edge of each step is.  If you use a stepladder: ? Make sure that it is fully opened. Do not climb a closed stepladder. ? Make sure that both sides of the stepladder are locked into place. ? Ask someone to hold the stepladder for you while you use it.  If there are any pets around you, be aware of where they are. What can I do in the bathroom?      Keep the floor dry. Clean up any water that spills onto the floor as soon as it happens.  Remove soap buildup in the tub or shower regularly.  Use non-skid mats or decals on the floor of the tub or shower.  Attach bath mats securely with double-sided, non-slip rug tape.  If you need to sit down in the shower, use a plastic, non-slip stool.  Install grab bars by the toilet and in the tub and shower. Do not use towel bars as grab bars. What can I do in the bedroom?  Make sure that you have a light by your bed that is easy to reach.  Do not use any sheets or blankets that are too big for your bed. They should not hang down onto the floor.  Have a firm chair that has side arms. You can use this for support while you get dressed. What can I do in the kitchen?  Clean up any spills right away.  If you need to reach something above you, use a strong step stool that has a grab bar.  Keep electrical cords out of the way.  Do not use floor polish or wax that makes floors slippery. If you must use wax, use non-skid floor wax. What can I do with my stairs?  Do not leave any items on the stairs.  Make sure that you have a light switch at the top of the stairs and the bottom of the stairs. If you do not have them, ask someone to add them for you.  Make sure that there are handrails on both sides of the stairs, and use them. Fix handrails that are broken or loose. Make sure  that handrails are as long as the stairways.  Install non-slip stair treads on all stairs in your home.  Avoid having throw rugs at the top or bottom of the stairs. If you do have throw rugs, attach them to the floor with carpet tape.  Choose a carpet that does not hide the edge of the steps on the stairway.  Check any carpeting to make sure that it is firmly attached to the stairs. Fix any carpet that is loose or worn. What can I do on the outside of my home?  Use bright outdoor lighting.  Regularly fix the edges of walkways and driveways and fix any cracks.  Remove anything that might make you trip as you walk through a door, such as a raised step or threshold.  Trim any bushes or trees on the path to your home.  Regularly check to see if handrails are loose or broken. Make sure that  both sides of any steps have handrails.  Install guardrails along the edges of any raised decks and porches.  Clear walking paths of anything that might make someone trip, such as tools or rocks.  Have any leaves, snow, or ice cleared regularly.  Use sand or salt on walking paths during winter.  Clean up any spills in your garage right away. This includes grease or oil spills. What other actions can I take?  Wear shoes that: ? Have a low heel. Do not wear high heels. ? Have rubber bottoms. ? Are comfortable and fit you well. ? Are closed at the toe. Do not wear open-toe sandals.  Use tools that help you move around (mobility aids) if they are needed. These include: ? Canes. ? Walkers. ? Scooters. ? Crutches.  Review your medicines with your doctor. Some medicines can make you feel dizzy. This can increase your chance of falling. Ask your doctor what other things you can do to help prevent falls. Where to find more information  Centers for Disease Control and Prevention, STEADI: https://garcia.biz/  [employer] on Aging: BrainJudge.co.uk Contact a doctor if:  You are  afraid of falling at home.  You feel weak, drowsy, or dizzy at home.  You fall at home. Summary  There are many simple things that you can do to make your home safe and to help prevent falls.  Ways to make your home safe include removing tripping hazards and installing grab bars in the bathroom.  Ask for help when making these changes in your home. This information is not intended to replace advice given to you by your health care provider. Make sure you discuss any questions you have with your health care provider. Document Released: 04/23/2009 Document Revised: 02/09/2017 Document Reviewed: 02/09/2017 Elsevier Interactive Patient Education  2019 [place] Maintenance, Female Adopting a healthy lifestyle and getting preventive care can go a long way to promote health and wellness. Talk with your health care provider about what schedule of regular examinations is right for you. This is a good chance for you to check in with your provider about disease prevention and staying healthy. In between checkups, there are plenty of things you can do on your own. Experts have done a lot of research about which lifestyle changes and preventive measures are most likely to keep you healthy. Ask your health care provider for more information. Weight and diet Eat a healthy diet  Be sure to include plenty of vegetables, fruits, low-fat dairy products, and lean protein.  Do not eat a lot of foods high in solid fats, added sugars, or salt.  Get regular exercise. This is one of the most important things you can do for your health. ? Most adults should exercise for at least 150 minutes each week. The exercise should increase your heart rate and make you sweat (moderate-intensity exercise). ? Most adults should also do strengthening exercises at least twice a week. This is in addition to the moderate-intensity exercise. Maintain a healthy weight  Body mass index (BMI) is a measurement that can  be used to identify possible weight problems. It estimates body fat based on height and weight. Your health care provider can help determine your BMI and help you achieve or maintain a healthy weight.  For females 31 years of age and older: ? A BMI below 18.5 is considered underweight. ? A BMI of 18.5 to 24.9 is normal. ? A BMI of 25 to 29.9 is considered overweight. ?  A BMI of 30 and above is considered obese. Watch levels of cholesterol and blood lipids  You should start having your blood tested for lipids and cholesterol at 62 years of age, then have this test every 5 years.  You may need to have your cholesterol levels checked more often if: ? Your lipid or cholesterol levels are high. ? You are older than 62 years of age. ? You are at high risk for heart disease. Cancer screening Lung Cancer  Lung cancer screening is recommended for adults 37-17 years old who are at high risk for lung cancer because of a history of smoking.  A yearly low-dose CT scan of the lungs is recommended for people who: ? Currently smoke. ? Have quit within the past 15 years. ? Have at least a 30-pack-year history of smoking. A pack year is smoking an average of one pack of cigarettes a day for 1 year.  Yearly screening should continue until it has been 15 years since you quit.  Yearly screening should stop if you develop a health problem that would prevent you from having lung cancer treatment. Breast Cancer  Practice breast self-awareness. This means understanding how your breasts normally appear and feel.  It also means doing regular breast self-exams. Let your health care provider know about any changes, no matter how small.  If you are in your 20s or 30s, you should have a clinical breast exam (CBE) by a health care provider every 1-3 years as part of a regular health exam.  If you are 35 or older, have a CBE every year. Also consider having a breast X-ray (mammogram) every year.  If you have a  family history of breast cancer, talk to your health care provider about genetic screening.  If you are at high risk for breast cancer, talk to your health care provider about having an MRI and a mammogram every year.  Breast cancer gene (BRCA) assessment is recommended for women who have family members with BRCA-related cancers. BRCA-related cancers include: ? Breast. ? Ovarian. ? Tubal. ? Peritoneal cancers.  Results of the assessment will determine the need for genetic counseling and BRCA1 and BRCA2 testing. Cervical Cancer Your health care provider may recommend that you be screened regularly for cancer of the pelvic organs (ovaries, uterus, and vagina). This screening involves a pelvic examination, including checking for microscopic changes to the surface of your cervix (Pap test). You may be encouraged to have this screening done every 3 years, beginning at age 77.  For women ages 58-65, health care providers may recommend pelvic exams and Pap testing every 3 years, or they may recommend the Pap and pelvic exam, combined with testing for human papilloma virus (HPV), every 5 years. Some types of HPV increase your risk of cervical cancer. Testing for HPV may also be done on women of any age with unclear Pap test results.  Other health care providers may not recommend any screening for nonpregnant women who are considered low risk for pelvic cancer and who do not have symptoms. Ask your health care provider if a screening pelvic exam is right for you.  If you have had past treatment for cervical cancer or a condition that could lead to cancer, you need Pap tests and screening for cancer for at least 20 years after your treatment. If Pap tests have been discontinued, your risk factors (such as having a new sexual partner) need to be reassessed to determine if screening should resume. Some women  have medical problems that increase the chance of getting cervical cancer. In these cases, your health  care provider may recommend more frequent screening and Pap tests. Colorectal Cancer  This type of cancer can be detected and often prevented.  Routine colorectal cancer screening usually begins at 62 years of age and continues through 62 years of age.  Your health care provider may recommend screening at an earlier age if you have risk factors for colon cancer.  Your health care provider may also recommend using home test kits to check for hidden blood in the stool.  A small camera at the end of a tube can be used to examine your colon directly (sigmoidoscopy or colonoscopy). This is done to check for the earliest forms of colorectal cancer.  Routine screening usually begins at age 50.  Direct examination of the colon should be repeated every 5-10 years through 62 years of age. However, you may need to be screened more often if early forms of precancerous polyps or small growths are found. Skin Cancer  Check your skin from head to toe regularly.  Tell your health care provider about any new moles or changes in moles, especially if there is a change in a mole's shape or color.  Also tell your health care provider if you have a mole that is larger than the size of a pencil eraser.  Always use sunscreen. Apply sunscreen liberally and repeatedly throughout the day.  Protect yourself by wearing long sleeves, pants, a wide-brimmed hat, and sunglasses whenever you are outside. Heart disease, diabetes, and high blood pressure  High blood pressure causes heart disease and increases the risk of stroke. High blood pressure is more likely to develop in: ? People who have blood pressure in the high end of the normal range (130-139/85-89 mm Hg). ? People who are overweight or obese. ? People who are African American.  If you are 26-24 years of age, have your blood pressure checked every 3-5 years. If you are 31 years of age or older, have your blood pressure checked every year. You should have  your blood pressure measured twice--once when you are at a hospital or clinic, and once when you are not at a hospital or clinic. Record the average of the two measurements. To check your blood pressure when you are not at a hospital or clinic, you can use: ? An automated blood pressure machine at a pharmacy. ? A home blood pressure monitor.  If you are between 57 years and 67 years old, ask your health care provider if you should take aspirin to prevent strokes.  Have regular diabetes screenings. This involves taking a blood sample to check your fasting blood sugar level. ? If you are at a normal weight and have a low risk for diabetes, have this test once every three years after 62 years of age. ? If you are overweight and have a high risk for diabetes, consider being tested at a younger age or more often. Preventing infection Hepatitis B  If you have a higher risk for hepatitis B, you should be screened for this virus. You are considered at high risk for hepatitis B if: ? You were born in a country where hepatitis B is common. Ask your health care provider which countries are considered high risk. ? Your parents were born in a high-risk country, and you have not been immunized against hepatitis B (hepatitis B vaccine). ? You have HIV or AIDS. ? You use needles to  inject street drugs. ? You live with someone who has hepatitis B. ? You have had sex with someone who has hepatitis B. ? You get hemodialysis treatment. ? You take certain medicines for conditions, including cancer, organ transplantation, and autoimmune conditions. Hepatitis C  Blood testing is recommended for: ? Everyone born from [birth year] through 1964. ? Anyone with known risk factors for hepatitis C. Sexually transmitted infections (STIs)  You should be screened for sexually transmitted infections (STIs) including gonorrhea and chlamydia if: ? You are sexually active and are younger than 62 years of age. ? You are older than  62 years of age and your health care provider tells you that you are at risk for this type of infection. ? Your sexual activity has changed since you were last screened and you are at an increased risk for chlamydia or gonorrhea. Ask your health care provider if you are at risk.  If you do not have HIV, but are at risk, it may be recommended that you take a prescription medicine daily to prevent HIV infection. This is called pre-exposure prophylaxis (PrEP). You are considered at risk if: ? You are sexually active and do not regularly use condoms or know the HIV status of your partner(s). ? You take drugs by injection. ? You are sexually active with a partner who has HIV. Talk with your health care provider about whether you are at high risk of being infected with HIV. If you choose to begin PrEP, you should first be tested for HIV. You should then be tested every 3 months for as long as you are taking PrEP. Pregnancy  If you are premenopausal and you may become pregnant, ask your health care provider about preconception counseling.  If you may become pregnant, take 400 to 800 micrograms (mcg) of folic acid every day.  If you want to prevent pregnancy, talk to your health care provider about birth control (contraception). Osteoporosis and menopause  Osteoporosis is a disease in which the bones lose minerals and strength with aging. This can result in serious bone fractures. Your risk for osteoporosis can be identified using a bone density scan.  If you are 60 years of age or older, or if you are at risk for osteoporosis and fractures, ask your health care provider if you should be screened.  Ask your health care provider whether you should take a calcium or vitamin D supplement to lower your risk for osteoporosis.  Menopause may have certain physical symptoms and risks.  Hormone replacement therapy may reduce some of these symptoms and risks. Talk to your health care provider about whether  hormone replacement therapy is right for you. Follow these instructions at home:  Schedule regular health, dental, and eye exams.  Stay current with your immunizations.  Do not use any tobacco products including cigarettes, chewing tobacco, or electronic cigarettes.  If you are pregnant, do not drink alcohol.  If you are breastfeeding, limit how much and how often you drink alcohol.  Limit alcohol intake to no more than 1 drink per day for nonpregnant women. One drink equals 12 ounces of beer, 5 ounces of wine, or 1 ounces of hard liquor.  Do not use street drugs.  Do not share needles.  Ask your health care provider for help if you need support or information about quitting drugs.  Tell your health care provider if you often feel depressed.  Tell your health care provider if you have ever been abused or do not feel  safe at home. This information is not intended to replace advice given to you by your health care provider. Make sure you discuss any questions you have with your health care provider. Document Released: 01/10/2011 Document Revised: 12/03/2015 Document Reviewed: 03/31/2015 Elsevier Interactive Patient Education  2019 [employer].

## 2018-10-08 ENCOUNTER — Encounter (INDEPENDENT_AMBULATORY_CARE_PROVIDER_SITE_OTHER): Payer: Self-pay

## 2018-10-08 DIAGNOSIS — I10 Essential (primary) hypertension: Secondary | ICD-10-CM | POA: Diagnosis not present

## 2018-10-08 DIAGNOSIS — G4733 Obstructive sleep apnea (adult) (pediatric): Secondary | ICD-10-CM | POA: Diagnosis not present

## 2018-10-14 DIAGNOSIS — M79609 Pain in unspecified limb: Secondary | ICD-10-CM | POA: Diagnosis not present

## 2018-10-14 DIAGNOSIS — M519 Unspecified thoracic, thoracolumbar and lumbosacral intervertebral disc disorder: Secondary | ICD-10-CM | POA: Diagnosis not present

## 2018-10-14 DIAGNOSIS — I10 Essential (primary) hypertension: Secondary | ICD-10-CM | POA: Diagnosis not present

## 2018-10-14 DIAGNOSIS — G4733 Obstructive sleep apnea (adult) (pediatric): Secondary | ICD-10-CM | POA: Diagnosis not present

## 2018-10-22 ENCOUNTER — Ambulatory Visit (INDEPENDENT_AMBULATORY_CARE_PROVIDER_SITE_OTHER): Payer: Medicare Other | Admitting: Family Medicine

## 2018-10-22 ENCOUNTER — Encounter (INDEPENDENT_AMBULATORY_CARE_PROVIDER_SITE_OTHER): Payer: Self-pay | Admitting: Family Medicine

## 2018-10-22 ENCOUNTER — Other Ambulatory Visit: Payer: Self-pay

## 2018-10-22 DIAGNOSIS — Z6841 Body Mass Index (BMI) 40.0 and over, adult: Secondary | ICD-10-CM | POA: Diagnosis not present

## 2018-10-22 DIAGNOSIS — R7303 Prediabetes: Secondary | ICD-10-CM

## 2018-10-22 MED ORDER — METFORMIN HCL 500 MG PO TABS: 500.0000 mg | ORAL_TABLET | Freq: Every day | ORAL | 0 refills | Status: DC

## 2018-10-22 NOTE — Progress Notes (Signed)
Office: [phone]  /  Fax: [phone] TeleHealth Visit:  [name] has verbally consented to this TeleHealth visit today. The patient is located at home, the provider is located at the [facility] office. The participants in this visit include the listed provider and patient. Raimi was unable to use Skype and the Telehealth visit was conducted via telephone.  HPI:   Chief Complaint: OBESITY [name] is here to discuss her progress with her obesity treatment plan. She is on the Category 2 plan and is following her eating plan approximately 50 to 50 % of the time. She states she is exercising 0 minutes 0 times per week. [name] is currently struggling to stay on track mostly due to grocery store shortages and family issues. She feels like she has maintained her weight. She is not exercising due to her hip pain worsening.  We were unable to weigh the patient today for this TeleHealth visit. She feels as if she has maintained weight since her last visit. She has lost 11 lbs since starting treatment with [place].  Pre-Diabetes [name] has a diagnosis of pre-diabetes based on her elevated Hgb A1c of 5.7 on 08/26/18 and she is attempting to improve with diet, but this has been difficult due to COVID19 isolation. She notes some polyphagia, which is worse in the afternoon. She is not taking metformin currently and continues to work on diet and exercise to decrease risk of diabetes.   ASSESSMENT AND PLAN:  Prediabetes - Plan: metFORMIN (GLUCOPHAGE) 500 MG tablet  Class 3 severe obesity with serious comorbidity and body mass index (BMI) of 50.0 to 59.9 in adult, unspecified obesity type [facility])  PLAN:  Pre-Diabetes [name] will continue to work on weight loss, exercise, and decreasing simple carbohydrates in her diet to help decrease the risk of diabetes. She was informed that eating too many simple carbohydrates or too many calories at one sitting increases the likelihood of GI side  effects. [name] agreed to continue her diet and to start metformin 500 mg qAM #30 with no refills and a prescription was written today. [name] agreed to follow up with [place] as directed to monitor her progress in 2 weeks.   Obesity [name] is currently in the action stage of change. As such, her goal is to continue with weight loss efforts. She has agreed to follow the Category 2 plan. [name] has been instructed to work up to a goal of 150 minutes of combined cardio and strengthening exercise per week for weight loss and overall health benefits. We discussed the following Behavioral Modification Strategies today: decreasing sodium intake, work on meal planning and easy cooking plans, ways to avoid boredom eating, keeping healthy foods in the home, better snacking choices, and ways to avoid night time snacking.  [name] has agreed to follow up with our clinic in 2 weeks. She was informed of the importance of frequent follow up visits to maximize her success with intensive lifestyle modifications for her multiple health conditions.  ALLERGIES: Allergies  Allergen Reactions  . Ketorolac Tromethamine Shortness Of Breath and Palpitations  . Atrovent [Ipratropium] Hives and Rash  . Latex Other (See Comments)    Powdered. Confirm type of reaction with patient.    MEDICATIONS: Current Outpatient Medications on File Prior to Visit  Medication Sig Dispense Refill  . albuterol (PROVENTIL HFA;VENTOLIN HFA) 108 (90 Base) MCG/ACT inhaler Inhale 1-2 puffs into the lungs every 6 (six) hours as needed for wheezing or shortness of breath. 3 Inhaler 4  .  budesonide-formoterol (SYMBICORT) 160-4.5 MCG/ACT inhaler Inhale 2 puffs into the lungs 2 (two) times daily. 3 Inhaler 4  . diclofenac (VOLTAREN) 75 MG EC tablet Take 1 tablet (75 mg total) by mouth 2 (two) times daily as needed. 40 tablet 0  . DULoxetine (CYMBALTA) 60 MG capsule Take 1 capsule (60 mg total) by mouth daily. 90 capsule 1  . levocetirizine (XYZAL)  5 MG tablet Take 1 tablet (5 mg total) by mouth daily as needed for allergies.    [name] lisinopril-hydrochlorothiazide (PRINZIDE,ZESTORETIC) 20-25 MG tablet Take 1 tablet by mouth daily. 90 tablet 1  . montelukast (SINGULAIR) 10 MG tablet Take 1 tablet (10 mg total) by mouth at bedtime. (Patient taking differently: Take 10 mg by mouth daily as needed (Asthma). ) 90 tablet 1  . pantoprazole (PROTONIX) 40 MG tablet Take 1 tablet (40 mg total) by mouth daily as needed (for acid reflux).    [name] tiZANidine (ZANAFLEX) 2 MG tablet Take 1 tablet (2 mg total) by mouth every 8 (eight) hours as needed for muscle spasms. 45 tablet 1  . Vitamin D, Ergocalciferol, (DRISDOL) 1.25 MG (50000 UT) CAPS capsule Take 1 capsule (50,000 Units total) by mouth every 7 (seven) days. 4 capsule 0  . zolpidem (AMBIEN) 5 MG tablet 1 at bedtime if needed for sleep 15 tablet 1  . Zoster Vaccine Adjuvanted [facility]) injection 0.5 ml in muscle and repeat in 8 weeks 0.5 mL 1   No current facility-administered medications on file prior to visit.     PAST MEDICAL HISTORY: Past Medical History:  Diagnosis Date  . Abnormal EKG 06/2003   History of inverted T waves V1-V3. Normal 2D echo (07/23/2003): LVEF 65%.  . Anemia    BL Hgb 11-12. Ferritin 14 - low normal (08/2007). Colonoscopy 2009 - external hemorrhoids (excellent prep). Last anemia panel (12/2010) - Iron  24, TIBC 269,  B12  316, Folate 11.9, Ferritin 73.  . Asthma   . B12 deficiency   . Back pain   . Chest pain   . Degenerative joint disease    BL knees (L>R), lumbar spine. Followed by Sports Medicine, Dr. Jennette Kettle.  . Dyspnea   . History of multiple pulmonary nodules    Incidental finding: CT Abd/ Pelvis (04/2010) - Several small lower lobe lung nodules, including one pure ground-glass pulmonary nodule measuring 8 mm in the left lower lobe.  Recommend follow-up chest CT (IV contrast preferred) in 6 months to document stability. //  CT Abd/ Pelvis (07/2010) -  3 mm RLL and  8  mm LLL nodule stable.  Other nodules unchanged, likely benign.  . Hyperlipidemia   . Hypertension   . Incarcerated ventral hernia 04/2010   Noted on CT Abd/ pelvis (04/2010). Patient now s/p ventral hernia repair by Dr. Gerrit Friends (12/2010)  . Insomnia   . 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 Left hip pain   . Left ventricular diastolic dysfunction   . Leg edema   . Lower extremity edema    Chronic. 2D echo (2005) - EF 65%.  . Obesity   . OSA (obstructive sleep apnea)   . Palpitations   . Positive D dimer   . Right knee pain     PAST SURGICAL HISTORY: Past Surgical History:  Procedure Laterality Date  . COLONOSCOPY  2009  . COLONOSCOPY WITH PROPOFOL N/A 12/18/2017   Procedure: COLONOSCOPY WITH PROPOFOL;  Surgeon: Iva Boop, MD;  Location: [facility];  Service:  Endoscopy;  Laterality: N/A;  . FOOT SURGERY  2004    left  . INCISION AND DRAINAGE ABSCESS ANAL  07/2008   I&D and debridgement of anorectal abscess  . INCISIONAL HERNIA REPAIR  12/2010   Repair of incarcerated ventral incisional hernia with Ethicon mesh patch - performed by Dr. Gerrit FriendsGerkin.   . INGUINAL HERNIA REPAIR    . JOINT REPLACEMENT Left 2013   left knee  . TOTAL KNEE ARTHROPLASTY  06/16/2011   Left TOTAL KNEE ARTHROPLASTY;  Surgeon: Kennieth RadArthur F Carter;  Location: [facility] OR;  Service: Orthopedics;  Laterality: Left;  left total knee arthroplasty  . TUBAL LIGATION      SOCIAL HISTORY: Social History   Tobacco Use  . Smoking status: Former Smoker    Packs/day: 0.50    Years: 20.00    Pack years: 10.00    Types: Cigarettes    Last attempt to quit: 09/10/1998    Years since quitting: 20.1  . Smokeless tobacco: Never Used  . Tobacco comment: 63 yo   Substance Use Topics  . Alcohol use: No    Alcohol/week: 0.0 standard drinks  . Drug use: No    FAMILY HISTORY: Family History  Problem Relation Age of Onset  . Diabetes Mother   . Hypertension Mother   . Stroke Mother   .  Hyperlipidemia Mother   . Heart disease Mother   . Sudden death Mother   . Kidney disease Mother   . Depression Mother   . Dementia Father   . Heart disease Father   . Hyperlipidemia Father   . Sudden death Father   . Depression Father   . Hypertension Sister   . Diabetes Brother   . Hypertension Brother   . Rashes / Skin problems Maternal Grandfather   . Heart attack Neg Hx     ROS: Review of Systems  Endo/Heme/Allergies:       Positive for polyphagia.    PHYSICAL EXAM: Pt in no acute distress  RECENT LABS AND TESTS: BMET    Component Value Date/Time   NA 143 08/16/2018 1207   K 4.0 08/16/2018 1207   CL 100 08/16/2018 1207   CO2 23 08/16/2018 1207   GLUCOSE 99 08/16/2018 1207   GLUCOSE 92 05/02/2018 1124   BUN 6 (L) 08/16/2018 1207   CREATININE 0.78 08/16/2018 1207   CREATININE 0.74 09/15/2017 1519   CALCIUM 9.8 08/16/2018 1207   GFRNONAA 82 08/16/2018 1207   GFRNONAA 81 03/21/2013 1530   GFRAA 95 08/16/2018 1207   GFRAA >89 03/21/2013 1530   Lab Results  Component Value Date   HGBA1C 5.7 (H) 08/16/2018   HGBA1C 5.7 05/02/2018   HGBA1C 5.3 10/25/2017   HGBA1C 5.6 12/02/2016   HGBA1C 5.7 (H) 04/02/2016   Lab Results  Component Value Date   INSULIN 10.7 08/16/2018   INSULIN 12.2 05/09/2018   CBC    Component Value Date/Time   WBC 5.1 05/09/2018 1007   WBC 5.3 11/07/2017 1635   RBC 4.38 05/09/2018 1007   RBC 4.16 11/07/2017 1635   HGB 11.9 05/09/2018 1007   HCT 38.1 05/09/2018 1007   PLT 363.0 11/07/2017 1635   MCV 87 05/09/2018 1007   MCH 27.2 05/09/2018 1007   MCH 27.7 10/25/2017 0207   MCHC 31.2 (L) 05/09/2018 1007   MCHC 32.4 11/07/2017 1635   RDW 13.6 05/09/2018 1007   LYMPHSABS 1.7 05/09/2018 1007   MONOABS 0.4 11/07/2017 1635   EOSABS 0.3 05/09/2018 1007  BASOSABS 0.1 05/09/2018 1007   Iron/TIBC/Ferritin/ %Sat    Component Value Date/Time   IRON 47 10/25/2017 0859   TIBC 330 10/25/2017 0859   FERRITIN 15 10/25/2017 0859    IRONPCTSAT 14 10/25/2017 0859   IRONPCTSAT 17 (L) 11/17/2011 1656   Lipid Panel     Component Value Date/Time   CHOL 195 08/16/2018 1207   TRIG 85 08/16/2018 1207   HDL 45 08/16/2018 1207   CHOLHDL 4.6 10/25/2017 1208   VLDL 10 10/25/2017 1208   LDLCALC 133 (H) 08/16/2018 1207   Hepatic Function Panel     Component Value Date/Time   PROT 6.9 08/16/2018 1207   ALBUMIN 4.0 08/16/2018 1207   AST 11 08/16/2018 1207   ALT 10 08/16/2018 1207   ALKPHOS 117 08/16/2018 1207   BILITOT 0.3 08/16/2018 1207   BILIDIR 0.1 05/03/2010 0625   IBILI 0.3 05/03/2010 0625      Component Value Date/Time   TSH 1.900 05/09/2018 1007   TSH 3.440 10/25/2017 0859   TSH 1.307 07/16/2014 1202   TSH 1.821 11/02/2012 1235   Results for [name], [name] (MRN [id number]) as of 10/22/2018 14:51  Ref. Range 08/16/2018 12:07  Vitamin D, 25-Hydroxy Latest Ref Range: 30.0 - 100.0 ng/mL 22.2 (L)     I, [name], [name], am acting as transcriptionist for Wilder Glade, MD I have reviewed the above documentation for accuracy and completeness, and I agree with the above. -Holly Quince, MD

## 2018-11-08 ENCOUNTER — Other Ambulatory Visit: Payer: Self-pay

## 2018-11-08 ENCOUNTER — Ambulatory Visit (INDEPENDENT_AMBULATORY_CARE_PROVIDER_SITE_OTHER): Payer: Medicare Other | Admitting: Family Medicine

## 2018-11-08 ENCOUNTER — Encounter (INDEPENDENT_AMBULATORY_CARE_PROVIDER_SITE_OTHER): Payer: Self-pay | Admitting: Family Medicine

## 2018-11-08 DIAGNOSIS — Z6841 Body Mass Index (BMI) 40.0 and over, adult: Secondary | ICD-10-CM

## 2018-11-08 DIAGNOSIS — F3289 Other specified depressive episodes: Secondary | ICD-10-CM | POA: Diagnosis not present

## 2018-11-08 DIAGNOSIS — E559 Vitamin D deficiency, unspecified: Secondary | ICD-10-CM | POA: Diagnosis not present

## 2018-11-10 ENCOUNTER — Encounter (INDEPENDENT_AMBULATORY_CARE_PROVIDER_SITE_OTHER): Payer: Self-pay | Admitting: Family Medicine

## 2018-11-12 MED ORDER — VITAMIN D (ERGOCALCIFEROL) 1.25 MG (50000 UNIT) PO CAPS: 50000.0000 [IU] | ORAL_CAPSULE | ORAL | 0 refills | Status: DC

## 2018-11-12 MED ORDER — BUPROPION HCL ER (SR) 150 MG PO TB12: 150.0000 mg | ORAL_TABLET | Freq: Every day | ORAL | 0 refills | Status: DC

## 2018-11-12 NOTE — Progress Notes (Signed)
Office: [phone]  /  Fax: [phone] TeleHealth Visit:  LAURALEE WATERS has verbally consented to this TeleHealth visit today. The patient is located at home, the provider is located at the [facility] office. The participants in this visit include the listed provider and patient. Tyera was unable to use Face Time today and the telehealth visit was conducted via telephone.  HPI:   Chief Complaint: OBESITY Shresta is here to discuss her progress with her obesity treatment plan. She is on the Category 2 plan and is following her eating plan approximately 0 % of the time. She states she is exercising 0 minutes 0 times per week. Crystallee states that she is doing more stress and comfort eating. She is not following the plan well and is feeling guilty about this. She is very sedentary due to her severe hip pain and is unable to exercise.  We were unable to weigh the patient today for this TeleHealth visit. She feels as if she has gained weight since her last visit. She has lost 11 lbs since starting treatment with [place].  Vitamin D Deficiency Mahina has a diagnosis of vitamin D deficiency. She is currently on vit D, but is not yet at goal. Loann denies nausea, vomiting, or muscle weakness.  Depression with emotional eating behaviors Callahan's mood is decreased. She is doing more comfort eating and is frustrated with herself. She is not sleeping as well either. She is struggling with emotional eating and using food for comfort to the extent that it is negatively impacting her health. She often snacks when she is not hungry. Mette sometimes feels she is out of control and then feels guilty that she made poor food choices. She has been working on behavior modification techniques to help reduce her emotional eating and has been somewhat successful. She shows no sign of suicidal or homicidal ideations.  Depression screen [facility] 2/9 10/05/2018 05/09/2018 09/29/2017 09/27/2017 08/18/2016  Decreased  Interest 0  Down, Depressed, Hopeless 0 0  PHQ - 2 Score 0  Altered sleeping 0 -  Tired, decreased energy -  Change in appetite -  Feeling bad or failure about yourself  0 0 1 0 -  Trouble concentrating 0 1 2 0 -  Moving slowly or fidgety/restless 0 0 1 0 -  Suicidal thoughts 0 0 0 0 -  PHQ-9 Score -  Difficult doing work/chores - Somewhat difficult Very difficult Very difficult -  Some recent data might be hidden   ASSESSMENT AND PLAN:  Vitamin D deficiency - Plan: Vitamin D, Ergocalciferol, (DRISDOL) 1.25 MG (50000 UT) CAPS capsule  Other depression - with emotional eating - Plan: buPROPion (WELLBUTRIN SR) 150 MG 12 hr tablet  Class 3 severe obesity with serious comorbidity and body mass index (BMI) of 50.0 to 59.9 in adult, unspecified obesity type (HCC)  PLAN:  Vitamin D Deficiency Cyana was informed that low vitamin D levels contribute to fatigue and are associated with obesity, breast, and colon cancer. Stephany agrees to continue to take prescription Vit D ,000 IU every week #4 with no refills and will follow up for routine testing of vitamin D, at least 2-3 times per year. She was informed of the risk of over-replacement of vitamin D and agrees to not increase her dose unless she discusses this with [place] first. Ayzia agrees  to follow up in 2 weeks as directed.  Depression with Emotional Eating Behaviors We discussed behavior modification techniques today to help Earldine deal with her emotional eating and depression. She has agreed to continue Cymbalta and to start to take Wellbutrin SR 150 mg qAM #30 with no refills. Zaylyn also agrees to check her blood pressure daily and to follow up as directed.  Obesity Marlisa is currently in the action stage of change. As such, her goal is to maintain weight for now while we work on her depression and stress eating. She has agreed to follow the Category 2 plan. Denequa has been  instructed to work up to a goal of 150 minutes of combined cardio and strengthening exercise per week for weight loss and overall health benefits. We discussed the following Behavioral Modification Strategies today: emotional eating strategies, ways to avoid boredom eating, and ways to avoid night time snacking.  Ruqaiyah has agreed to follow up with our clinic in 2 weeks. She was informed of the importance of frequent follow up visits to maximize her success with intensive lifestyle modifications for her multiple health conditions.  ALLERGIES: Allergies  Allergen Reactions  . Ketorolac Tromethamine Shortness Of Breath and Palpitations  . Atrovent [Ipratropium] Hives and Rash  . Latex Other (See Comments)    Powdered. Confirm type of reaction with patient.    MEDICATIONS: Current Outpatient Medications on File Prior to Visit  Medication Sig Dispense Refill  . albuterol (PROVENTIL HFA;VENTOLIN HFA) 108 (90 Base) MCG/ACT inhaler Inhale 1-2 puffs into the lungs every 6 (six) hours as needed for wheezing or shortness of breath. 3 Inhaler 4  . budesonide-formoterol (SYMBICORT) 160-4.5 MCG/ACT inhaler Inhale 2 puffs into the lungs 2 (two) times daily. 3 Inhaler 4  . diclofenac (VOLTAREN) 75 MG EC tablet Take 1 tablet (75 mg total) by mouth 2 (two) times daily as needed. 40 tablet 0  . DULoxetine (CYMBALTA) 60 MG capsule Take 1 capsule (60 mg total) by mouth daily. 90 capsule 1  . levocetirizine (XYZAL) 5 MG tablet Take 1 tablet (5 mg total) by mouth daily as needed for allergies.    [name] lisinopril-hydrochlorothiazide (PRINZIDE,ZESTORETIC) 20-25 MG tablet Take 1 tablet by mouth daily. 90 tablet 1  . metFORMIN (GLUCOPHAGE) 500 MG tablet Take 1 tablet (500 mg total) by mouth daily with breakfast. 30 tablet 0  . montelukast (SINGULAIR) 10 MG tablet Take 1 tablet (10 mg total) by mouth at bedtime. (Patient taking differently: Take 10 mg by mouth daily as needed (Asthma). ) 90 tablet 1  . pantoprazole  (PROTONIX) 40 MG tablet Take 1 tablet (40 mg total) by mouth daily as needed (for acid reflux).    [name] tiZANidine (ZANAFLEX) 2 MG tablet Take 1 tablet (2 mg total) by mouth every 8 (eight) hours as needed for muscle spasms. 45 tablet 1  . zolpidem (AMBIEN) 5 MG tablet 1 at bedtime if needed for sleep 15 tablet 1  . Zoster Vaccine Adjuvanted [facility]) injection 0.5 ml in muscle and repeat in 8 weeks 0.5 mL 1   No current facility-administered medications on file prior to visit.     PAST MEDICAL HISTORY: Past Medical History:  Diagnosis Date  . Abnormal EKG 06/2003   History of inverted T waves V1-V3. Normal 2D echo (07/23/2003): LVEF 65%.  . Anemia    BL Hgb 11-12. Ferritin 14 - low normal (08/2007). Colonoscopy 2009 - external hemorrhoids (excellent prep). Last anemia panel (12/2010) - Iron  24, TIBC 269,  B12  316, Folate 11.9, Ferritin 73.  . Asthma   . B12 deficiency   . Back pain   . Chest pain   . Degenerative joint disease    BL knees (L>R), lumbar spine. Followed by Sports Medicine, Dr. Jennette Kettle.  . Dyspnea   . History of multiple pulmonary nodules    Incidental finding: CT Abd/ Pelvis (04/2010) - Several small lower lobe lung nodules, including one pure ground-glass pulmonary nodule measuring 8 mm in the left lower lobe.  Recommend follow-up chest CT (IV contrast preferred) in 6 months to document stability. //  CT Abd/ Pelvis (07/2010) -  3 mm RLL and  8 mm LLL nodule stable.  Other nodules unchanged, likely benign.  . Hyperlipidemia   . Hypertension   . Incarcerated ventral hernia 04/2010   Noted on CT Abd/ pelvis (04/2010). Patient now s/p ventral hernia repair by Dr. Gerrit Friends (12/2010)  . Insomnia   . 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 Left hip pain   . Left ventricular diastolic dysfunction   . Leg edema   . Lower extremity edema    Chronic. 2D echo (2005) - EF 65%.  . Obesity   . OSA (obstructive sleep apnea)   . Palpitations   . Positive  D dimer   . Right knee pain     PAST SURGICAL HISTORY: Past Surgical History:  Procedure Laterality Date  . COLONOSCOPY  2009  . COLONOSCOPY WITH PROPOFOL N/A 12/18/2017   Procedure: COLONOSCOPY WITH PROPOFOL;  Surgeon: Iva Boop, MD;  Location: [facility];  Service: Endoscopy;  Laterality: N/A;  . FOOT SURGERY  2004    left  . INCISION AND DRAINAGE ABSCESS ANAL  07/2008   I&D and debridgement of anorectal abscess  . INCISIONAL HERNIA REPAIR  12/2010   Repair of incarcerated ventral incisional hernia with Ethicon mesh patch - performed by Dr. Gerrit Friends.   . INGUINAL HERNIA REPAIR    . JOINT REPLACEMENT Left 2013   left knee  . TOTAL KNEE ARTHROPLASTY  06/16/2011   Left TOTAL KNEE ARTHROPLASTY;  Surgeon: Kennieth Rad;  Location: [facility];  Service: Orthopedics;  Laterality: Left;  left total knee arthroplasty  . TUBAL LIGATION      SOCIAL HISTORY: Social History   Tobacco Use  . Smoking status: Former Smoker    Packs/day: 0.50    Years: 20.00    Pack years: 10.00    Types: Cigarettes    Last attempt to quit: 09/10/1998    Years since quitting: 20.1  . Smokeless tobacco: Never Used  . Tobacco comment: 63 yo   Substance Use Topics  . Alcohol use: No    Alcohol/week: 0.0 standard drinks  . Drug use: No    FAMILY HISTORY: Family History  Problem Relation Age of Onset  . Diabetes Mother   . Hypertension Mother   . Stroke Mother   . Hyperlipidemia Mother   . Heart disease Mother   . Sudden death Mother   . Kidney disease Mother   . Depression Mother   . Dementia Father   . Heart disease Father   . Hyperlipidemia Father   . Sudden death Father   . Depression Father   . Hypertension Sister   . Diabetes Brother   . Hypertension Brother   . Rashes / Skin problems Maternal Grandfather   . Heart attack Neg Hx     ROS: Review of Systems  Gastrointestinal: Negative for nausea  and vomiting.  Musculoskeletal:       Negative for muscle weakness.   Psychiatric/Behavioral: Positive for depression. Negative for suicidal ideas.       Negative for homicidal ideations.    PHYSICAL EXAM: Pt in no acute distress  RECENT LABS AND TESTS: BMET    Component Value Date/Time   NA 143 08/16/2018 1207   K 4.0 08/16/2018 1207   CL 100 08/16/2018 1207   CO2 23 08/16/2018 1207   GLUCOSE 99 08/16/2018 1207   GLUCOSE 92 05/02/2018 1124   BUN 6 (L) 08/16/2018 1207   CREATININE 0.78 08/16/2018 1207   CREATININE 0.74 09/15/2017 1519   CALCIUM 9.8 08/16/2018 1207   GFRNONAA 82 08/16/2018 1207   GFRNONAA 81 03/21/2013 1530   GFRAA 95 08/16/2018 1207   GFRAA >89 03/21/2013 1530   Lab Results  Component Value Date   HGBA1C 5.7 (H) 08/16/2018   HGBA1C 5.7 05/02/2018   HGBA1C 5.3 10/25/2017   HGBA1C 5.6 12/02/2016   HGBA1C 5.7 (H) 04/02/2016   Lab Results  Component Value Date   INSULIN 10.7 08/16/2018   INSULIN 12.2 05/09/2018   CBC    Component Value Date/Time   WBC 5.1 05/09/2018 1007   WBC 5.3 11/07/2017 1635   RBC 4.38 05/09/2018 1007   RBC 4.16 11/07/2017 1635   HGB 11.9 05/09/2018 1007   HCT 38.1 05/09/2018 1007   PLT 363.0 11/07/2017 1635   MCV 87 05/09/2018 1007   MCH 27.2 05/09/2018 1007   MCH 27.7 10/25/2017 0207   MCHC 31.2 (L) 05/09/2018 1007   MCHC 32.4 11/07/2017 1635   RDW 13.6 05/09/2018 1007   LYMPHSABS 1.7 05/09/2018 1007   MONOABS 0.4 11/07/2017 1635   EOSABS 0.3 05/09/2018 1007   BASOSABS 0.1 05/09/2018 1007   Iron/TIBC/Ferritin/ %Sat    Component Value Date/Time   IRON 47 10/25/2017 0859   TIBC 330 10/25/2017 0859   FERRITIN 15 10/25/2017 0859   IRONPCTSAT 14 10/25/2017 0859   IRONPCTSAT 17 (L) 11/17/2011 1656   Lipid Panel     Component Value Date/Time   CHOL 195 08/16/2018 1207   TRIG 85 08/16/2018 1207   HDL 45 08/16/2018 1207   CHOLHDL 4.6 10/25/2017 1208   VLDL 10 10/25/2017 1208   LDLCALC 133 (H) 08/16/2018 1207   Hepatic Function Panel     Component Value Date/Time   PROT 6.9  08/16/2018 1207   ALBUMIN 4.0 08/16/2018 1207   AST 11 08/16/2018 1207   ALT 10 08/16/2018 1207   ALKPHOS 117 08/16/2018 1207   BILITOT 0.3 08/16/2018 1207   BILIDIR 0.1 05/03/2010 0625   IBILI 0.3 05/03/2010 0625      Component Value Date/Time   TSH 1.900 05/09/2018 1007   TSH 3.440 10/25/2017 0859   TSH 1.307 07/16/2014 1202   TSH 1.821 11/02/2012 1235   Results for Antony BlackbirdVANS, Tacarra W (MRN [id number]) as of 11/12/2018 09:43  Ref. Range 08/16/2018 12:07  Vitamin D, 25-Hydroxy Latest Ref Range: 30.0 - 100.0 ng/mL 22.2 (L)     I, Kirke Corinara Emidio Warrell, CMA, am acting as transcriptionist for Wilder Gladearen D. Beasley, MD I have reviewed the above documentation for accuracy and completeness, and I agree with the above. -Quillian Quincearen Beasley, MD I spent > than 50% of the 25 minute visit on counseling as documented in the note.

## 2018-11-13 DIAGNOSIS — M79609 Pain in unspecified limb: Secondary | ICD-10-CM | POA: Diagnosis not present

## 2018-11-13 DIAGNOSIS — M519 Unspecified thoracic, thoracolumbar and lumbosacral intervertebral disc disorder: Secondary | ICD-10-CM | POA: Diagnosis not present

## 2018-11-13 DIAGNOSIS — I10 Essential (primary) hypertension: Secondary | ICD-10-CM | POA: Diagnosis not present

## 2018-11-13 DIAGNOSIS — G4733 Obstructive sleep apnea (adult) (pediatric): Secondary | ICD-10-CM | POA: Diagnosis not present

## 2018-11-22 ENCOUNTER — Ambulatory Visit (INDEPENDENT_AMBULATORY_CARE_PROVIDER_SITE_OTHER): Payer: Medicare Other | Admitting: Family Medicine

## 2018-11-22 ENCOUNTER — Other Ambulatory Visit: Payer: Self-pay

## 2018-11-22 ENCOUNTER — Encounter (INDEPENDENT_AMBULATORY_CARE_PROVIDER_SITE_OTHER): Payer: Self-pay | Admitting: Family Medicine

## 2018-11-22 DIAGNOSIS — Z6841 Body Mass Index (BMI) 40.0 and over, adult: Secondary | ICD-10-CM | POA: Diagnosis not present

## 2018-11-22 DIAGNOSIS — R7303 Prediabetes: Secondary | ICD-10-CM

## 2018-11-26 MED ORDER — METFORMIN HCL 500 MG PO TABS: 500.0000 mg | ORAL_TABLET | Freq: Two times a day (BID) | ORAL | 0 refills | Status: DC

## 2018-11-27 NOTE — Progress Notes (Signed)
Office: [phone]  /  Fax: [phone] TeleHealth Visit:  [name] has verbally consented to this TeleHealth visit today. The patient is located at home, the provider is located at the [facility] office. The participants in this visit include the listed provider and patient. Natyia was unable to use Skype today and the telehealth visit was conducted via telephone.  HPI:   Chief Complaint: OBESITY [name] is here to discuss her progress with her obesity treatment plan. She is on the Category 2 plan and is following her eating plan approximately 70 % of the time. She states she is exercising 0 minutes 0 times per week. [name] feels that she is doing better with weight loss and thinks that she has lost another 3 pounds. She is doing better with meal planning and states that her hunger is controlled.  We were unable to weigh the patient today for this TeleHealth visit. She feels as if she has lost weight since her last visit. She has lost 11 lbs since starting treatment with [place].  Pre-Diabetes [name] has a diagnosis of pre-diabetes based on her elevated Hgb A1c and was informed this puts her at greater risk of developing diabetes. She is doing well on metformin, but is still struggling with some afternoon polyphagia. She continues to work on diet and exercise to decrease risk of diabetes. She denies nausea, vomiting, or hypoglycemia.   ASSESSMENT AND PLAN:  Prediabetes - Plan: metFORMIN (GLUCOPHAGE) 500 MG tablet  Class 3 severe obesity with serious comorbidity and body mass index (BMI) of 50.0 to 59.9 in adult, unspecified obesity type [name])  PLAN:  Pre-Diabetes [name] will continue to work on weight loss, exercise, and decreasing simple carbohydrates in her diet to help decrease the risk of diabetes. She was informed that eating too many simple carbohydrates or too many calories at one sitting increases the likelihood of GI side effects. [name] agreed to continue her  diet, exercise, and to increase metformin 500 mg BID #60 with no refills and a prescription was written today. [name] agreed to follow up with [place] as directed to monitor her progress in 2 weeks.   I spent > than 50% of the 25 minute visit on counseling as documented in the note.  Obesity [name] is currently in the action stage of change. As such, her goal is to continue with weight loss efforts. She has agreed to follow the Category 2 plan. [name] has been instructed to work up to a goal of 150 minutes of combined cardio and strengthening exercise per week for weight loss and overall health benefits. We discussed the following Behavioral Modification Strategies today: dealing with family or coworker sabotage, keeping healthy foods in the home, and ways to avoid boredom eating.  [name] has agreed to follow up with our clinic in 2 weeks. She was informed of the importance of frequent follow up visits to maximize her success with intensive lifestyle modifications for her multiple health conditions.  ALLERGIES: Allergies  Allergen Reactions  . Ketorolac Tromethamine Shortness Of Breath and Palpitations  . Atrovent [Ipratropium] Hives and Rash  . Latex Other (See Comments)    Powdered. Confirm type of reaction with patient.    MEDICATIONS: Current Outpatient Medications on File Prior to Visit  Medication Sig Dispense Refill  . albuterol (PROVENTIL HFA;VENTOLIN HFA) 108 (90 Base) MCG/ACT inhaler Inhale 1-2 puffs into the lungs every 6 (six) hours as needed for wheezing or shortness of breath. 3 Inhaler 4  . budesonide-formoterol (  SYMBICORT) 160-4.5 MCG/ACT inhaler Inhale 2 puffs into the lungs 2 (two) times daily. 3 Inhaler 4  . buPROPion (WELLBUTRIN SR) 150 MG 12 hr tablet Take 1 tablet (150 mg total) by mouth daily. 30 tablet 0  . diclofenac (VOLTAREN) 75 MG EC tablet Take 1 tablet (75 mg total) by mouth 2 (two) times daily as needed. 40 tablet 0  . DULoxetine (CYMBALTA) 60 MG capsule Take 1  capsule (60 mg total) by mouth daily. 90 capsule 1  . levocetirizine (XYZAL) 5 MG tablet Take 1 tablet (5 mg total) by mouth daily as needed for allergies.    [name]. lisinopril-hydrochlorothiazide (PRINZIDE,ZESTORETIC) 20-25 MG tablet Take 1 tablet by mouth daily. 90 tablet 1  . montelukast (SINGULAIR) 10 MG tablet Take 1 tablet (10 mg total) by mouth at bedtime. (Patient taking differently: Take 10 mg by mouth daily as needed (Asthma). ) 90 tablet 1  . pantoprazole (PROTONIX) 40 MG tablet Take 1 tablet (40 mg total) by mouth daily as needed (for acid reflux).    [name]. tiZANidine (ZANAFLEX) 2 MG tablet Take 1 tablet (2 mg total) by mouth every 8 (eight) hours as needed for muscle spasms. 45 tablet 1  . Vitamin D, Ergocalciferol, (DRISDOL) 1.25 MG (50000 UT) CAPS capsule Take 1 capsule (50,000 Units total) by mouth every 7 (seven) days. 4 capsule 0  . zolpidem (AMBIEN) 5 MG tablet 1 at bedtime if needed for sleep 15 tablet 1  . Zoster Vaccine Adjuvanted [facility](SHINGRIX) injection 0.5 ml in muscle and repeat in 8 weeks 0.5 mL 1   No current facility-administered medications on file prior to visit.     PAST MEDICAL HISTORY: Past Medical History:  Diagnosis Date  . Abnormal EKG 06/2003   History of inverted T waves V1-V3. Normal 2D echo (07/23/2003): LVEF 65%.  . Anemia    BL Hgb 11-12. Ferritin 14 - low normal (08/2007). Colonoscopy 2009 - external hemorrhoids (excellent prep). Last anemia panel (12/2010) - Iron  24, TIBC 269,  B12  316, Folate 11.9, Ferritin 73.  . Asthma   . B12 deficiency   . Back pain   . Chest pain   . Degenerative joint disease    BL knees (L>R), lumbar spine. Followed by Sports Medicine, Dr. Jennette KettleNeal.  . Dyspnea   . History of multiple pulmonary nodules    Incidental finding: CT Abd/ Pelvis (04/2010) - Several small lower lobe lung nodules, including one pure ground-glass pulmonary nodule measuring 8 mm in the left lower lobe.  Recommend follow-up chest CT (IV contrast preferred) in 6  months to document stability. //  CT Abd/ Pelvis (07/2010) -  3 mm RLL and  8 mm LLL nodule stable.  Other nodules unchanged, likely benign.  . Hyperlipidemia   . Hypertension   . Incarcerated ventral hernia 04/2010   Noted on CT Abd/ pelvis (04/2010). Patient now s/p ventral hernia repair by Dr. Gerrit FriendsGerkin (12/2010)  . Insomnia   . 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 Left hip pain   . Left ventricular diastolic dysfunction   . Leg edema   . Lower extremity edema    Chronic. 2D echo (2005) - EF 65%.  . Obesity   . OSA (obstructive sleep apnea)   . Palpitations   . Positive D dimer   . Right knee pain     PAST SURGICAL HISTORY: Past Surgical History:  Procedure Laterality Date  . COLONOSCOPY  2009  . COLONOSCOPY WITH  PROPOFOL N/A 12/18/2017   Procedure: COLONOSCOPY WITH PROPOFOL;  Surgeon: Iva Boop, MD;  Location: [facility];  Service: Endoscopy;  Laterality: N/A;  . FOOT SURGERY  2004    left  . INCISION AND DRAINAGE ABSCESS ANAL  07/2008   I&D and debridgement of anorectal abscess  . INCISIONAL HERNIA REPAIR  12/2010   Repair of incarcerated ventral incisional hernia with Ethicon mesh patch - performed by Dr. Gerrit Friends.   . INGUINAL HERNIA REPAIR    . JOINT REPLACEMENT Left 2013   left knee  . TOTAL KNEE ARTHROPLASTY  06/16/2011   Left TOTAL KNEE ARTHROPLASTY;  Surgeon: Kennieth Rad;  Location: [facility];  Service: Orthopedics;  Laterality: Left;  left total knee arthroplasty  . TUBAL LIGATION      SOCIAL HISTORY: Social History   Tobacco Use  . Smoking status: Former Smoker    Packs/day: 0.50    Years: 20.00    Pack years: 10.00    Types: Cigarettes    Last attempt to quit: 09/10/1998    Years since quitting: 20.2  . Smokeless tobacco: Never Used  . Tobacco comment: 63 yo   Substance Use Topics  . Alcohol use: No    Alcohol/week: 0.0 standard drinks  . Drug use: No    FAMILY HISTORY: Family History  Problem Relation Age of  Onset  . Diabetes Mother   . Hypertension Mother   . Stroke Mother   . Hyperlipidemia Mother   . Heart disease Mother   . Sudden death Mother   . Kidney disease Mother   . Depression Mother   . Dementia Father   . Heart disease Father   . Hyperlipidemia Father   . Sudden death Father   . Depression Father   . Hypertension Sister   . Diabetes Brother   . Hypertension Brother   . Rashes / Skin problems Maternal Grandfather   . Heart attack Neg Hx     ROS: Review of Systems  Gastrointestinal: Negative for nausea and vomiting.  Endo/Heme/Allergies:       Negative for hypoglycemia. Positive for polyphagia.    PHYSICAL EXAM: Pt in no acute distress  RECENT LABS AND TESTS: BMET    Component Value Date/Time   NA 143 08/16/2018 1207   K 4.0 08/16/2018 1207   CL 100 08/16/2018 1207   CO2 23 08/16/2018 1207   GLUCOSE 99 08/16/2018 1207   GLUCOSE 92 05/02/2018 1124   BUN 6 (L) 08/16/2018 1207   CREATININE 0.78 08/16/2018 1207   CREATININE 0.74 09/15/2017 1519   CALCIUM 9.8 08/16/2018 1207   GFRNONAA 82 08/16/2018 1207   GFRNONAA 81 03/21/2013 1530   GFRAA 95 08/16/2018 1207   GFRAA >89 03/21/2013 1530   Lab Results  Component Value Date   HGBA1C 5.7 (H) 08/16/2018   HGBA1C 5.7 05/02/2018   HGBA1C 5.3 10/25/2017   HGBA1C 5.6 12/02/2016   HGBA1C 5.7 (H) 04/02/2016   Lab Results  Component Value Date   INSULIN 10.7 08/16/2018   INSULIN 12.2 05/09/2018   CBC    Component Value Date/Time   WBC 5.1 05/09/2018 1007   WBC 5.3 11/07/2017 1635   RBC 4.38 05/09/2018 1007   RBC 4.16 11/07/2017 1635   HGB 11.9 05/09/2018 1007   HCT 38.1 05/09/2018 1007   PLT 363.0 11/07/2017 1635   MCV 87 05/09/2018 1007   MCH 27.2 05/09/2018 1007   MCH 27.7 10/25/2017 0207   MCHC 31.2 (L) 05/09/2018 1007  MCHC 32.4 11/07/2017 1635   RDW 13.6 05/09/2018 1007   LYMPHSABS 1.7 05/09/2018 1007   MONOABS 0.4 11/07/2017 1635   EOSABS 0.3 05/09/2018 1007   BASOSABS 0.1 05/09/2018  1007   Iron/TIBC/Ferritin/ %Sat    Component Value Date/Time   IRON 47 10/25/2017 0859   TIBC 330 10/25/2017 0859   FERRITIN 15 10/25/2017 0859   IRONPCTSAT 14 10/25/2017 0859   IRONPCTSAT 17 (L) 11/17/2011 1656   Lipid Panel     Component Value Date/Time   CHOL 195 08/16/2018 1207   TRIG 85 08/16/2018 1207   HDL 45 08/16/2018 1207   CHOLHDL 4.6 10/25/2017 1208   VLDL 10 10/25/2017 1208   LDLCALC 133 (H) 08/16/2018 1207   Hepatic Function Panel     Component Value Date/Time   PROT 6.9 08/16/2018 1207   ALBUMIN 4.0 08/16/2018 1207   AST 11 08/16/2018 1207   ALT 10 08/16/2018 1207   ALKPHOS 117 08/16/2018 1207   BILITOT 0.3 08/16/2018 1207   BILIDIR 0.1 05/03/2010 0625   IBILI 0.3 05/03/2010 0625      Component Value Date/Time   TSH 1.900 05/09/2018 1007   TSH 3.440 10/25/2017 0859   TSH 1.307 07/16/2014 1202   TSH 1.821 11/02/2012 1235   Results for LAURENA, VALKO (MRN [id number]) as of 11/27/2018 10:43  Ref. Range 08/16/2018 12:07  Vitamin D, 25-Hydroxy Latest Ref Range: 30.0 - 100.0 ng/mL 22.2 (L)     I, Kirke Corin, CMA, am acting as transcriptionist for Wilder Glade, MD I have reviewed the above documentation for accuracy and completeness, and I agree with the above. -Quillian Quince, MD

## 2018-12-10 ENCOUNTER — Ambulatory Visit (INDEPENDENT_AMBULATORY_CARE_PROVIDER_SITE_OTHER): Payer: Medicare Other | Admitting: Family Medicine

## 2018-12-10 ENCOUNTER — Encounter (INDEPENDENT_AMBULATORY_CARE_PROVIDER_SITE_OTHER): Payer: Self-pay | Admitting: Family Medicine

## 2018-12-10 ENCOUNTER — Other Ambulatory Visit: Payer: Self-pay

## 2018-12-10 DIAGNOSIS — F3289 Other specified depressive episodes: Secondary | ICD-10-CM | POA: Diagnosis not present

## 2018-12-10 DIAGNOSIS — I10 Essential (primary) hypertension: Secondary | ICD-10-CM | POA: Diagnosis not present

## 2018-12-10 DIAGNOSIS — Z6841 Body Mass Index (BMI) 40.0 and over, adult: Secondary | ICD-10-CM | POA: Diagnosis not present

## 2018-12-10 MED ORDER — BUPROPION HCL ER (SR) 150 MG PO TB12: 150.0000 mg | ORAL_TABLET | Freq: Every day | ORAL | 0 refills | Status: DC

## 2018-12-10 MED ORDER — AMLODIPINE BESYLATE 5 MG PO TABS: 5.0000 mg | ORAL_TABLET | Freq: Every day | ORAL | 0 refills | Status: DC

## 2018-12-11 DIAGNOSIS — F32A Depression, unspecified: Secondary | ICD-10-CM | POA: Insufficient documentation

## 2018-12-11 DIAGNOSIS — F329 Major depressive disorder, single episode, unspecified: Secondary | ICD-10-CM | POA: Insufficient documentation

## 2018-12-11 NOTE — Progress Notes (Signed)
Office: [phone]  /  Fax: [phone] TeleHealth Visit:  [name] has verbally consented to this TeleHealth visit today. The patient is located at home, the provider is located at the [facility] office. The participants in this visit include the listed provider and patient. [name] was unable to use realtime audiovisual technology today and the telehealth visit was conducted via telephone.   HPI:   Chief Complaint: OBESITY [name] is here to discuss her progress with her obesity treatment plan. She is on the  follow the Category 2 plan and is following her eating plan approximately 75 % of the time. She states she is exercising 0 minutes 0 times per week. [name] has done well with maintaining her weight. She is unable to follow her plan closely sue to grocery shortages and family preferences. She states her hunger is controlled. We were unable to weigh the patient today for this TeleHealth visit. She is unsure if she has lost or gained weight since her last visit. She has lost 11 lbs since starting treatment with [place].  Hypertension [name] is a 63 y.o. female with hypertension. [name]'s blood pressure is still not at goal. Her last reading at home was 144/80, which is consistent with what we have seen in the office. She denies chest pain or headaches. She is working on weight loss to help control her blood pressure with the goal of decreasing her risk of heart attack and stroke.   Depression with Emotional Eating Behaviors [name] states her mood is stable and she denies insomnia, change in blood pressure, or palpitations. She is still emotionally eating but feels she is more in control. [name] struggles with emotional eating and using food for comfort to the extent that it is negatively impacting her health. She often snacks when she is not hungry. [name] sometimes feels she is out of control and then feels guilty that she made poor food choices. She has been  working on behavior modification techniques to help reduce her emotional eating and has been somewhat successful. She shows no sign of suicidal or homicidal ideations.  Depression screen [facility] 2/9 10/05/2018 05/09/2018 09/29/2017 09/27/2017 08/18/2016  Decreased Interest 0  Down, Depressed, Hopeless 0 0  PHQ - 2 Score 0  Altered sleeping 0 -  Tired, decreased energy -  Change in appetite -  Feeling bad or failure about yourself  0 0 1 0 -  Trouble concentrating 0 1 2 0 -  Moving slowly or fidgety/restless 0 0 1 0 -  Suicidal thoughts 0 0 0 0 -  PHQ-9 Score -  Difficult doing work/chores - Somewhat difficult Very difficult Very difficult -  Some recent data might be hidden    ASSESSMENT AND PLAN:  Essential hypertension - Plan: amLODipine (NORVASC) 5 MG tablet  Other depression - with emotional eating - Plan: buPROPion (WELLBUTRIN SR) 150 MG 12 hr tablet  Class 3 severe obesity with serious comorbidity and body mass index (BMI) of 50.0 to 59.9 in adult, unspecified obesity type (HCC)  PLAN:  Hypertension We discussed sodium restriction, working on healthy weight loss, and a regular exercise program as the means to achieve improved blood pressure control. [name] agreed with this plan and agreed to follow up as directed. We will continue to monitor her blood pressure as well as her progress  with the above lifestyle modifications. [name] agrees to continue taking lisinopril-hydrochlorothiazide, and she agrees to start Norvasc 5 mg q daily #30 with no refills. She will watch for signs of hypotension as she continues her lifestyle modifications. [name] agrees to follow up with our clinic in 2 weeks.  Depression with Emotional Eating Behaviors We discussed behavior modification techniques today to help [name] deal with her emotional eating and depression. [name] agrees to continue taking Wellbutrin SR 150 mg qd #30 and we will refill for 1  month. [name] agrees to follow up with our clinic in 2 weeks.  I spent > than 50% of the 25 minute visit on counseling as documented in the note.  Obesity [name] is currently in the action stage of change. As such, her goal is to continue with weight loss efforts She has agreed to follow the Category 2 plan [name] has been instructed to work up to a goal of 150 minutes of combined cardio and strengthening exercise per week for weight loss and overall health benefits. We discussed the following Behavioral Modification Strategies today: increasing lean protein intake, decreasing simple carbohydrates  and decreasing sodium intake, and increase H20 intake   [name] has agreed to follow up with our clinic in 2 weeks. She was informed of the importance of frequent follow up visits to maximize her success with intensive lifestyle modifications for her multiple health conditions.  ALLERGIES: Allergies  Allergen Reactions   Ketorolac Tromethamine Shortness Of Breath and Palpitations   Atrovent [Ipratropium] Hives and Rash   Latex Other (See Comments)    Powdered. Confirm type of reaction with patient.    MEDICATIONS: Current Outpatient Medications on File Prior to Visit  Medication Sig Dispense Refill   albuterol (PROVENTIL HFA;VENTOLIN HFA) 108 (90 Base) MCG/ACT inhaler Inhale 1-2 puffs into the lungs every 6 (six) hours as needed for wheezing or shortness of breath. 3 Inhaler 4   budesonide-formoterol (SYMBICORT) 160-4.5 MCG/ACT inhaler Inhale 2 puffs into the lungs 2 (two) times daily. 3 Inhaler 4   diclofenac (VOLTAREN) 75 MG EC tablet Take 1 tablet (75 mg total) by mouth 2 (two) times daily as needed. 40 tablet 0   DULoxetine (CYMBALTA) 60 MG capsule Take 1 capsule (60 mg total) by mouth daily. 90 capsule 1   levocetirizine (XYZAL) 5 MG tablet Take 1 tablet (5 mg total) by mouth daily as needed for allergies.     lisinopril-hydrochlorothiazide (PRINZIDE,ZESTORETIC) 20-25 MG tablet  Take 1 tablet by mouth daily. 90 tablet 1   metFORMIN (GLUCOPHAGE) 500 MG tablet Take 1 tablet (500 mg total) by mouth 2 (two) times daily with a meal. 60 tablet 0   montelukast (SINGULAIR) 10 MG tablet Take 1 tablet (10 mg total) by mouth at bedtime. (Patient taking differently: Take 10 mg by mouth daily as needed (Asthma). ) 90 tablet 1   pantoprazole (PROTONIX) 40 MG tablet Take 1 tablet (40 mg total) by mouth daily as needed (for acid reflux).     tiZANidine (ZANAFLEX) 2 MG tablet Take 1 tablet (2 mg total) by mouth every 8 (eight) hours as needed for muscle spasms. 45 tablet 1   Vitamin D, Ergocalciferol, (DRISDOL) 1.25 MG (50000 UT) CAPS capsule Take 1 capsule (50,000 Units total) by mouth every 7 (seven) days. 4 capsule 0   zolpidem (AMBIEN) 5 MG tablet 1 at bedtime if needed for sleep 15 tablet 1   Zoster Vaccine Adjuvanted [facility](SHINGRIX) injection 0.5 ml in muscle and repeat in 8 weeks 0.5 mL  1   No current facility-administered medications on file prior to visit.     PAST MEDICAL HISTORY: Past Medical History:  Diagnosis Date   Abnormal EKG 06/2003   History of inverted T waves V1-V3. Normal 2D echo (07/23/2003): LVEF 65%.   Anemia    BL Hgb 11-12. Ferritin 14 - low normal (08/2007). Colonoscopy 2009 - external hemorrhoids (excellent prep). Last anemia panel (12/2010) - Iron  24, TIBC 269,  B12  316, Folate 11.9, Ferritin 73.   Asthma    B12 deficiency    Back pain    Chest pain    Degenerative joint disease    BL knees (L>R), lumbar spine. Followed by Sports Medicine, Dr. Jennette Kettle.   Dyspnea    History of multiple pulmonary nodules    Incidental finding: CT Abd/ Pelvis (04/2010) - Several small lower lobe lung nodules, including one pure ground-glass pulmonary nodule measuring 8 mm in the left lower lobe.  Recommend follow-up chest CT (IV contrast preferred) in 6 months to document stability. //  CT Abd/ Pelvis (07/2010) -  3 mm RLL and  8 mm LLL nodule stable.  Other  nodules unchanged, likely benign.   Hyperlipidemia    Hypertension    Incarcerated ventral hernia 04/2010   Noted on CT Abd/ pelvis (04/2010). Patient now s/p ventral hernia repair by Dr. 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 2D echo (2005) - EF 65%.   Obesity    OSA (obstructive sleep apnea)    Palpitations    Positive D dimer    Right knee pain     PAST SURGICAL HISTORY: Past Surgical History:  Procedure Laterality Date   COLONOSCOPY  2009   COLONOSCOPY WITH PROPOFOL N/A 12/18/2017   Procedure: COLONOSCOPY WITH PROPOFOL;  Surgeon: Iva Boop, MD;  Location: [facility];  Service: Endoscopy;  Laterality: N/A;   FOOT SURGERY  2004    left   INCISION AND DRAINAGE ABSCESS ANAL  07/2008   I&D and debridgement of anorectal abscess   INCISIONAL HERNIA REPAIR  12/2010   Repair of incarcerated ventral incisional hernia with Ethicon mesh patch - performed by Dr. Gerrit Friends.    INGUINAL HERNIA REPAIR     JOINT REPLACEMENT Left 2013   left knee   TOTAL KNEE ARTHROPLASTY  06/16/2011   Left TOTAL KNEE ARTHROPLASTY;  Surgeon: Kennieth Rad;  Location: [facility] OR;  Service: Orthopedics;  Laterality: Left;  left total knee arthroplasty   TUBAL LIGATION      SOCIAL HISTORY: Social History   Tobacco Use   Smoking status: Former Smoker    Packs/day: 0.50    Years: 20.00    Pack years: 10.00    Types: Cigarettes    Last attempt to quit: 09/10/1998    Years since quitting: 20.2   Smokeless tobacco: Never Used   Tobacco comment: 63 yo   Substance Use Topics   Alcohol use: No    Alcohol/week: 0.0 standard drinks   Drug use: No    FAMILY HISTORY: Family History  Problem Relation Age of Onset   Diabetes Mother    Hypertension Mother    Stroke Mother    Hyperlipidemia Mother    Heart  disease Mother    Sudden death Mother  Kidney disease Mother    Depression Mother    Dementia Father    Heart disease Father    Hyperlipidemia Father    Sudden death Father    Depression Father    Hypertension Sister    Diabetes Brother    Hypertension Brother    Rashes / Skin problems Maternal Grandfather    Heart attack Neg Hx     ROS: Review of Systems  Constitutional: Negative for weight loss.  Cardiovascular: Negative for chest pain and palpitations.  Neurological: Negative for headaches.  Psychiatric/Behavioral: Positive for depression. Negative for suicidal ideas. The patient does not have insomnia.     PHYSICAL EXAM: Pt in no acute distress  RECENT LABS AND TESTS: BMET    Component Value Date/Time   NA 143 08/16/2018 1207   K 4.0 08/16/2018 1207   CL 100 08/16/2018 1207   CO2 23 08/16/2018 1207   GLUCOSE 99 08/16/2018 1207   GLUCOSE 92 05/02/2018 1124   BUN 6 (L) 08/16/2018 1207   CREATININE 0.78 08/16/2018 1207   CREATININE 0.74 09/15/2017 1519   CALCIUM 9.8 08/16/2018 1207   GFRNONAA 82 08/16/2018 1207   GFRNONAA 81 03/21/2013 1530   GFRAA 95 08/16/2018 1207   GFRAA >89 03/21/2013 1530   Lab Results  Component Value Date   HGBA1C 5.7 (H) 08/16/2018   HGBA1C 5.7 05/02/2018   HGBA1C 5.3 10/25/2017   HGBA1C 5.6 12/02/2016   HGBA1C 5.7 (H) 04/02/2016   Lab Results  Component Value Date   INSULIN 10.7 08/16/2018   INSULIN 12.2 05/09/2018   CBC    Component Value Date/Time   WBC 5.1 05/09/2018 1007   WBC 5.3 11/07/2017 1635   RBC 4.38 05/09/2018 1007   RBC 4.16 11/07/2017 1635   HGB 11.9 05/09/2018 1007   HCT 38.1 05/09/2018 1007   PLT 363.0 11/07/2017 1635   MCV 87 05/09/2018 1007   MCH 27.2 05/09/2018 1007   MCH 27.7 10/25/2017 0207   MCHC 31.2 (L) 05/09/2018 1007   MCHC 32.4 11/07/2017 1635   RDW 13.6 05/09/2018 1007   LYMPHSABS 1.7 05/09/2018 1007   MONOABS 0.4 11/07/2017 1635   EOSABS 0.3 05/09/2018 1007   BASOSABS  0.1 05/09/2018 1007   Iron/TIBC/Ferritin/ %Sat    Component Value Date/Time   IRON 47 10/25/2017 0859   TIBC 330 10/25/2017 0859   FERRITIN 15 10/25/2017 0859   IRONPCTSAT 14 10/25/2017 0859   IRONPCTSAT 17 (L) 11/17/2011 1656   Lipid Panel     Component Value Date/Time   CHOL 195 08/16/2018 1207   TRIG 85 08/16/2018 1207   HDL 45 08/16/2018 1207   CHOLHDL 4.6 10/25/2017 1208   VLDL 10 10/25/2017 1208   LDLCALC 133 (H) 08/16/2018 1207   Hepatic Function Panel     Component Value Date/Time   PROT 6.9 08/16/2018 1207   ALBUMIN 4.0 08/16/2018 1207   AST 11 08/16/2018 1207   ALT 10 08/16/2018 1207   ALKPHOS 117 08/16/2018 1207   BILITOT 0.3 08/16/2018 1207   BILIDIR 0.1 05/03/2010 0625   IBILI 0.3 05/03/2010 0625      Component Value Date/Time   TSH 1.900 05/09/2018 1007   TSH 3.440 10/25/2017 0859   TSH 1.307 07/16/2014 1202   TSH 1.821 11/02/2012 1235      I, Burt Knack, am acting as Energy manager for Quillian Quince, MD I have reviewed the above documentation for accuracy and completeness, and I agree with the above. -Quillian Quince, MD

## 2018-12-14 DIAGNOSIS — M79609 Pain in unspecified limb: Secondary | ICD-10-CM | POA: Diagnosis not present

## 2018-12-14 DIAGNOSIS — M519 Unspecified thoracic, thoracolumbar and lumbosacral intervertebral disc disorder: Secondary | ICD-10-CM | POA: Diagnosis not present

## 2018-12-14 DIAGNOSIS — I10 Essential (primary) hypertension: Secondary | ICD-10-CM | POA: Diagnosis not present

## 2018-12-14 DIAGNOSIS — G4733 Obstructive sleep apnea (adult) (pediatric): Secondary | ICD-10-CM | POA: Diagnosis not present

## 2018-12-26 ENCOUNTER — Ambulatory Visit (INDEPENDENT_AMBULATORY_CARE_PROVIDER_SITE_OTHER): Payer: Medicare Other | Admitting: Family Medicine

## 2018-12-26 ENCOUNTER — Encounter (INDEPENDENT_AMBULATORY_CARE_PROVIDER_SITE_OTHER): Payer: Self-pay | Admitting: Family Medicine

## 2018-12-26 ENCOUNTER — Other Ambulatory Visit: Payer: Self-pay

## 2018-12-26 DIAGNOSIS — R7303 Prediabetes: Secondary | ICD-10-CM

## 2018-12-26 DIAGNOSIS — E559 Vitamin D deficiency, unspecified: Secondary | ICD-10-CM

## 2018-12-26 DIAGNOSIS — Z6841 Body Mass Index (BMI) 40.0 and over, adult: Secondary | ICD-10-CM

## 2018-12-26 DIAGNOSIS — E66813 Obesity, class 3: Secondary | ICD-10-CM

## 2018-12-26 MED ORDER — VITAMIN D (ERGOCALCIFEROL) 1.25 MG (50000 UNIT) PO CAPS: 50000.0000 [IU] | ORAL_CAPSULE | ORAL | 0 refills | Status: DC

## 2018-12-26 MED ORDER — METFORMIN HCL 500 MG PO TABS: 500.0000 mg | ORAL_TABLET | Freq: Two times a day (BID) | ORAL | 0 refills | Status: DC

## 2019-01-01 ENCOUNTER — Telehealth: Payer: Self-pay | Admitting: *Deleted

## 2019-01-01 NOTE — Progress Notes (Signed)
Office: [phone]  /  Fax: [phone] TeleHealth Visit:  [name] has verbally consented to this TeleHealth visit today. The patient is located at home, the provider is located at the [facility] office. The participants in this visit include the listed provider and patient. [name] was unable to use [email or web address] today and the telehealth visit was conducted via telephone.  HPI:   Chief Complaint: OBESITY [name] is here to discuss her progress with her obesity treatment plan. She is on the Category 2 plan and is following her eating plan approximately 50 % of the time. She states she is trying to get up and move around. [name] notes decreased motivation for weight loss in the last 2 weeks with increased boredom eating. She is skipping meals and snacking more, especially in the afternoon. [name] states that she is ready to get back on track.  We were unable to weigh the patient today for this TeleHealth visit. She feels as if she has gained weight since her last visit. She has lost 11 lbs since starting treatment with us.  Pre-Diabetes [name] has a diagnosis of pre-diabetes based on her elevated Hgb A1c and was informed this puts her at greater risk of developing diabetes. [name] is trying to follow her eating plan, but still notes afternoon polyphagia. She is taking metformin currently and continues to work on diet and exercise to decrease risk of diabetes. She denies nausea, vomiting, or hypoglycemia.   Vitamin D Deficiency [name] has a diagnosis of vitamin D deficiency. She is currently stable on vit D, but is not yet at goal. [name] denies nausea, vomiting, or muscle weakness.  ASSESSMENT AND PLAN:  Prediabetes - Plan: metFORMIN (GLUCOPHAGE) 500 MG tablet  Vitamin D deficiency - Plan: Vitamin D, Ergocalciferol, (DRISDOL) 1.25 MG (50000 UT) CAPS capsule  Class 3 severe obesity with serious comorbidity and body mass index (BMI) of 50.0 to 59.9 in adult, unspecified  obesity type [facility](HCC)  PLAN:  Pre-Diabetes [name] will continue to work on weight loss, exercise, and decreasing simple carbohydrates in her diet to help decrease the risk of diabetes. She was informed that eating too many simple carbohydrates or too many calories at one sitting increases the likelihood of GI side effects. [name] agreed to increase metformin 500 mg BID #60 with no refills and a prescription was written today. [name] agreed to follow up with us as directed to monitor her progress in 2 weeks.   Vitamin D Deficiency [name] was informed that low vitamin D levels contribute to fatigue and are associated with obesity, breast, and colon cancer. [name] agrees to continue to take prescription Vit D @50 ,000 IU every week #4 with no refills and will follow up for routine testing of vitamin D, at least 2-3 times per year. She was informed of the risk of over-replacement of vitamin D and agrees to not increase her dose unless she discusses this with us first. [name] agrees to follow up in 2 weeks as directed.  I spent > than 50% of the 25 minute visit on counseling as documented in the note.  Obesity [name] is currently in the action stage of change. As such, her goal is to continue with weight loss efforts. She has agreed to follow the Category 2 plan. [name] has been instructed to work up to a goal of 150 minutes of combined cardio and strengthening exercise per week for weight loss and overall health benefits. We discussed the following Behavioral Modification Strategies today: decreasing simple  carbohydrates, no skipping meals, better snacking choices, and work on meal planning and easy cooking plans.  [name] has agreed to follow up with our clinic in 2 weeks. She was informed of the importance of frequent follow up visits to maximize her success with intensive lifestyle modifications for her multiple health conditions.  ALLERGIES: Allergies  Allergen Reactions  . Ketorolac Tromethamine  Shortness Of Breath and Palpitations  . Atrovent [Ipratropium] Hives and Rash  . Latex Other (See Comments)    Powdered. Confirm type of reaction with patient.    MEDICATIONS: Current Outpatient Medications on File Prior to Visit  Medication Sig Dispense Refill  . albuterol (PROVENTIL HFA;VENTOLIN HFA) 108 (90 Base) MCG/ACT inhaler Inhale 1-2 puffs into the lungs every 6 (six) hours as needed for wheezing or shortness of breath. 3 Inhaler 4  . amLODipine (NORVASC) 5 MG tablet Take 1 tablet (5 mg total) by mouth daily. 30 tablet 0  . budesonide-formoterol (SYMBICORT) 160-4.5 MCG/ACT inhaler Inhale 2 puffs into the lungs 2 (two) times daily. 3 Inhaler 4  . buPROPion (WELLBUTRIN SR) 150 MG 12 hr tablet Take 1 tablet (150 mg total) by mouth daily. 30 tablet 0  . diclofenac (VOLTAREN) 75 MG EC tablet Take 1 tablet (75 mg total) by mouth 2 (two) times daily as needed. 40 tablet 0  . DULoxetine (CYMBALTA) 60 MG capsule Take 1 capsule (60 mg total) by mouth daily. 90 capsule 1  . levocetirizine (XYZAL) 5 MG tablet Take 1 tablet (5 mg total) by mouth daily as needed for allergies.    [name]. lisinopril-hydrochlorothiazide (PRINZIDE,ZESTORETIC) 20-25 MG tablet Take 1 tablet by mouth daily. 90 tablet 1  . montelukast (SINGULAIR) 10 MG tablet Take 1 tablet (10 mg total) by mouth at bedtime. (Patient taking differently: Take 10 mg by mouth daily as needed (Asthma). ) 90 tablet 1  . pantoprazole (PROTONIX) 40 MG tablet Take 1 tablet (40 mg total) by mouth daily as needed (for acid reflux).    [name]. tiZANidine (ZANAFLEX) 2 MG tablet Take 1 tablet (2 mg total) by mouth every 8 (eight) hours as needed for muscle spasms. 45 tablet 1  . zolpidem (AMBIEN) 5 MG tablet 1 at bedtime if needed for sleep 15 tablet 1  . Zoster Vaccine Adjuvanted [facility](SHINGRIX) injection 0.5 ml in muscle and repeat in 8 weeks 0.5 mL 1   No current facility-administered medications on file prior to visit.     PAST MEDICAL HISTORY: Past Medical  History:  Diagnosis Date  . Abnormal EKG 06/2003   History of inverted T waves V1-V3. Normal 2D echo (07/23/2003): LVEF 65%.  . Anemia    BL Hgb 11-12. Ferritin 14 - low normal (08/2007). Colonoscopy 2009 - external hemorrhoids (excellent prep). Last anemia panel (12/2010) - Iron  24, TIBC 269,  B12  316, Folate 11.9, Ferritin 73.  . Asthma   . B12 deficiency   . Back pain   . Chest pain   . Degenerative joint disease    BL knees (L>R), lumbar spine. Followed by Sports Medicine, Dr. Jennette KettleNeal.  . Dyspnea   . History of multiple pulmonary nodules    Incidental finding: CT Abd/ Pelvis (04/2010) - Several small lower lobe lung nodules, including one pure ground-glass pulmonary nodule measuring 8 mm in the left lower lobe.  Recommend follow-up chest CT (IV contrast preferred) in 6 months to document stability. //  CT Abd/ Pelvis (07/2010) -  3 mm RLL and  8 mm LLL nodule stable.  Other  nodules unchanged, likely benign.  . Hyperlipidemia   . Hypertension   . Incarcerated ventral hernia 04/2010   Noted on CT Abd/ pelvis (04/2010). Patient now s/p ventral hernia repair by Dr. Harlow Asa (12/2010)  . Insomnia   . 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 Left hip pain   . Left ventricular diastolic dysfunction   . Leg edema   . Lower extremity edema    Chronic. 2D echo (2005) - EF 65%.  . Obesity   . OSA (obstructive sleep apnea)   . Palpitations   . Positive D dimer   . Right knee pain     PAST SURGICAL HISTORY: Past Surgical History:  Procedure Laterality Date  . COLONOSCOPY  2009  . COLONOSCOPY WITH PROPOFOL N/A 12/18/2017   Procedure: COLONOSCOPY WITH PROPOFOL;  Surgeon: Gatha Mayer, MD;  Location: [facility];  Service: Endoscopy;  Laterality: N/A;  . FOOT SURGERY  2004    left  . INCISION AND DRAINAGE ABSCESS ANAL  07/2008   I&D and debridgement of anorectal abscess  . INCISIONAL HERNIA REPAIR  12/2010   Repair of incarcerated ventral incisional hernia  with Ethicon mesh patch - performed by Dr. Harlow Asa.   . INGUINAL HERNIA REPAIR    . JOINT REPLACEMENT Left 2013   left knee  . TOTAL KNEE ARTHROPLASTY  06/16/2011   Left TOTAL KNEE ARTHROPLASTY;  Surgeon: Sharmon Revere;  Location: [address];  Service: Orthopedics;  Laterality: Left;  left total knee arthroplasty  . TUBAL LIGATION      SOCIAL HISTORY: Social History   Tobacco Use  . Smoking status: Former Smoker    Packs/day: 0.50    Years: 20.00    Pack years: 10.00    Types: Cigarettes    Quit date: 09/10/1998    Years since quitting: 20.3  . Smokeless tobacco: Never Used  . Tobacco comment: 63 yo   Substance Use Topics  . Alcohol use: No    Alcohol/week: 0.0 standard drinks  . Drug use: No    FAMILY HISTORY: Family History  Problem Relation Age of Onset  . Diabetes Mother   . Hypertension Mother   . Stroke Mother   . Hyperlipidemia Mother   . Heart disease Mother   . Sudden death Mother   . Kidney disease Mother   . Depression Mother   . Dementia Father   . Heart disease Father   . Hyperlipidemia Father   . Sudden death Father   . Depression Father   . Hypertension Sister   . Diabetes Brother   . Hypertension Brother   . Rashes / Skin problems Maternal Grandfather   . Heart attack Neg Hx     ROS: Review of Systems  Gastrointestinal: Negative for nausea and vomiting.  Musculoskeletal:       Negative for muscle weakness.  Endo/Heme/Allergies:       Negative for hypoglycemia. Positive for polyphagia.    PHYSICAL EXAM: Pt in no acute distress  RECENT LABS AND TESTS: BMET    Component Value Date/Time   NA 143 08/16/2018 1207   K 4.0 08/16/2018 1207   CL 100 08/16/2018 1207   CO2 23 08/16/2018 1207   GLUCOSE 99 08/16/2018 1207   GLUCOSE 92 05/02/2018 1124   BUN 6 (L) 08/16/2018 1207   CREATININE 0.78 08/16/2018 1207   CREATININE 0.74 09/15/2017 1519   CALCIUM 9.8 08/16/2018 1207   GFRNONAA 82 08/16/2018 1207   GFRNONAA  81 03/21/2013 1530   GFRAA  95 08/16/2018 1207   GFRAA >89 03/21/2013 1530   Lab Results  Component Value Date   HGBA1C 5.7 (H) 08/16/2018   HGBA1C 5.7 05/02/2018   HGBA1C 5.3 10/25/2017   HGBA1C 5.6 12/02/2016   HGBA1C 5.7 (H) 04/02/2016   Lab Results  Component Value Date   INSULIN 10.7 08/16/2018   INSULIN 12.2 05/09/2018   CBC    Component Value Date/Time   WBC 5.1 05/09/2018 1007   WBC 5.3 11/07/2017 1635   RBC 4.38 05/09/2018 1007   RBC 4.16 11/07/2017 1635   HGB 11.9 05/09/2018 1007   HCT 38.1 05/09/2018 1007   PLT 363.0 11/07/2017 1635   MCV 87 05/09/2018 1007   MCH 27.2 05/09/2018 1007   MCH 27.7 10/25/2017 0207   MCHC 31.2 (L) 05/09/2018 1007   MCHC 32.4 11/07/2017 1635   RDW 13.6 05/09/2018 1007   LYMPHSABS 1.7 05/09/2018 1007   MONOABS 0.4 11/07/2017 1635   EOSABS 0.3 05/09/2018 1007   BASOSABS 0.1 05/09/2018 1007   Iron/TIBC/Ferritin/ %Sat    Component Value Date/Time   IRON 47 10/25/2017 0859   TIBC 330 10/25/2017 0859   FERRITIN 15 10/25/2017 0859   IRONPCTSAT 14 10/25/2017 0859   IRONPCTSAT 17 (L) 11/17/2011 1656   Lipid Panel     Component Value Date/Time   CHOL 195 08/16/2018 1207   TRIG 85 08/16/2018 1207   HDL 45 08/16/2018 1207   CHOLHDL 4.6 10/25/2017 1208   VLDL 10 10/25/2017 1208   LDLCALC 133 (H) 08/16/2018 1207   Hepatic Function Panel     Component Value Date/Time   PROT 6.9 08/16/2018 1207   ALBUMIN 4.0 08/16/2018 1207   AST 11 08/16/2018 1207   ALT 10 08/16/2018 1207   ALKPHOS 117 08/16/2018 1207   BILITOT 0.3 08/16/2018 1207   BILIDIR 0.1 05/03/2010 0625   IBILI 0.3 05/03/2010 0625      Component Value Date/Time   TSH 1.900 05/09/2018 1007   TSH 3.440 10/25/2017 0859   TSH 1.307 07/16/2014 1202   TSH 1.821 11/02/2012 1235   Results for Holly BlackbirdVANS, Lashai W (MRN [id number]) as of 01/01/2019 14:16  Ref. Range 08/16/2018 12:07  Vitamin D, 25-Hydroxy Latest Ref Range: 30.0 - 100.0 ng/mL 22.2 (L)     I, Kirke Corinara Soares, CMA, am acting as  transcriptionist for Wilder Gladearen D. Sylvie Mifsud, MD I have reviewed the above documentation for accuracy and completeness, and I agree with the above. -Quillian Quincearen Diamond Jentz, MD

## 2019-01-01 NOTE — Progress Notes (Signed)
[name] is a 63 y.o.female, who is here today concerned about elevated BP. Last seen on 08/22/2018 for chest pain, resolved.  Since her last visit she has follow-up with weight loss clinic. According to patient, BP has been elevated. She is on Amlodipine 5 mg daily, lisinopril-HCTZ 20-25 mg daily. BP readings: She is not checking.  She has not noted visual changes, exertional chest pain, dyspnea,  focal weakness, or edema.  "Nagging" frontal headache for the past week, mild.  No associated nausea, vomiting. History of allergy rhinitis, she has not have nasal congestion, rhinorrhea, or sore throat.  She takes Xyzal and Singulair 10 mg daily. Lab Results  Component Value Date   CREATININE 0.78 08/16/2018   BUN 6 (L) 08/16/2018   NA 143 08/16/2018   K 4.0 08/16/2018   CL 100 08/16/2018   CO2 23 08/16/2018   Requesting refill on diclofenac, which really helps with joint pain. Back,hips,and knees. Follows with Dr [name]  Pain is exacerbated by prolonged standing,walking,or when moving after prolonged sitting. Alleviated by rest. + Stiffness. A few months ago Cymbalta was recommended,she has not been taking it for a couple months. Medication was helping some,she would like to resume it.   Review of Systems  Constitutional: Negative for chills and fever.  HENT: Negative for nosebleeds and sore throat.   Gastrointestinal: Negative for abdominal pain, nausea and vomiting.  Genitourinary: Negative for decreased urine volume and hematuria.  Musculoskeletal: Positive for arthralgias, back pain and gait problem.  Skin: Negative for pallor and rash.  Allergic/Immunologic: Positive for environmental allergies.  Rest see pertinent positives and negatives per HPI.  Current Outpatient Medications on File Prior to Visit  Medication Sig Dispense Refill  . albuterol (PROVENTIL HFA;VENTOLIN HFA) 108 (90 Base) MCG/ACT inhaler Inhale 1-2 puffs into the lungs every 6 (six) hours as  needed for wheezing or shortness of breath. 3 Inhaler 4  . amLODipine (NORVASC) 5 MG tablet Take 1 tablet (5 mg total) by mouth daily. 30 tablet 0  . budesonide-formoterol (SYMBICORT) 160-4.5 MCG/ACT inhaler Inhale 2 puffs into the lungs 2 (two) times daily. 3 Inhaler 4  . buPROPion (WELLBUTRIN SR) 150 MG 12 hr tablet Take 1 tablet (150 mg total) by mouth daily. 30 tablet 0  . DULoxetine (CYMBALTA) 60 MG capsule Take 1 capsule (60 mg total) by mouth daily. 90 capsule 1  . levocetirizine (XYZAL) 5 MG tablet Take 1 tablet (5 mg total) by mouth daily as needed for allergies.    . metFORMIN (GLUCOPHAGE) 500 MG tablet Take 1 tablet (500 mg total) by mouth 2 (two) times daily with a meal. 60 tablet 0  . montelukast (SINGULAIR) 10 MG tablet Take 1 tablet (10 mg total) by mouth at bedtime. (Patient taking differently: Take 10 mg by mouth daily as needed (Asthma). ) 90 tablet 1  . pantoprazole (PROTONIX) 40 MG tablet Take 1 tablet (40 mg total) by mouth daily as needed (for acid reflux).    [name]. tiZANidine (ZANAFLEX) 2 MG tablet Take 1 tablet (2 mg total) by mouth every 8 (eight) hours as needed for muscle spasms. 45 tablet 1  . traMADol (ULTRAM) 50 MG tablet Take 50 mg by mouth 2 (two) times daily as needed. for pain    . Vitamin D, Ergocalciferol, (DRISDOL) 1.25 MG (50000 UT) CAPS capsule Take 1 capsule (50,000 Units total) by mouth every 7 (seven) days. 4 capsule 0  . zolpidem (AMBIEN) 5 MG tablet 1 at bedtime if  needed for sleep 15 tablet 1  . Zoster Vaccine Adjuvanted [name]) injection 0.5 ml in muscle and repeat in 8 weeks 0.5 mL 1   No current facility-administered medications on file prior to visit.      Past Medical History:  Diagnosis Date  . Abnormal EKG 06/2003   History of inverted T waves V1-V3. Normal 2D echo (07/23/2003): LVEF 65%.  . Anemia    BL Hgb 11-12. Ferritin 14 - low normal (08/2007). Colonoscopy 2009 - external hemorrhoids (excellent prep). Last anemia panel (12/2010) - Iron   24, TIBC 269,  B12  316, Folate 11.9, Ferritin 73.  . Asthma   . B12 deficiency   . Back pain   . Chest pain   . Degenerative joint disease    BL knees (L>R), lumbar spine. Followed by Sports Medicine, Dr. Nori Haney.  . Dyspnea   . History of multiple pulmonary nodules    Incidental finding: CT Abd/ Pelvis (04/2010) - Several small lower lobe lung nodules, including one pure ground-glass pulmonary nodule measuring 8 mm in the left lower lobe.  Recommend follow-up chest CT (IV contrast preferred) in 6 months to document stability. //  CT Abd/ Pelvis (07/2010) -  3 mm RLL and  8 mm LLL nodule stable.  Other nodules unchanged, likely benign.  . Hyperlipidemia   . Hypertension   . Incarcerated ventral hernia 04/2010   Noted on CT Abd/ pelvis (04/2010). Patient now s/p ventral hernia repair by Dr. Harlow Haney (12/2010)  . Insomnia   . 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 Left hip pain   . Left ventricular diastolic dysfunction   . Leg edema   . Lower extremity edema    Chronic. 2D echo (2005) - EF 65%.  . Obesity   . OSA (obstructive sleep apnea)   . Palpitations   . Positive D dimer   . Right knee pain     Allergies  Allergen Reactions  . Ketorolac Tromethamine Shortness Of Breath and Palpitations  . Atrovent [Ipratropium] Hives and Rash  . Latex Other (See Comments)    Powdered. Confirm type of reaction with patient.    Social History   Socioeconomic History  . Marital status: Married    Spouse name: [name]  . Number of children: 3  . Years of education: Not on file  . Highest education level: Not on file  Occupational History  . Occupation: Stay at home  Social Needs  . Financial resource strain: Not hard at all  . Food insecurity    Worry: Never true    Inability: Never true  . Transportation needs    Medical: No    Non-medical: No  Tobacco Use  . Smoking status: Former Smoker    Packs/day: 0.50    Years: 20.00    Pack years: 10.00     Types: Cigarettes    Quit date: 09/10/1998    Years since quitting: 20.3  . Smokeless tobacco: Never Used  . Tobacco comment: 63 yo   Substance and Sexual Activity  . Alcohol use: No    Alcohol/week: 0.0 standard drinks  . Drug use: No  . Sexual activity: Yes    Partners: Male  Lifestyle  . Physical activity    Days per week: 0 days    Minutes per session: 0 min  . Stress: Only a little  Relationships  . Social Herbalist on phone: Three times a week  Gets together: Twice a week    Attends religious service: Not on file    Active member of club or organization: Not on file    Attends meetings of clubs or organizations: Not on file    Relationship status: Married  Other Topics Concern  . Not on file  Social History Narrative   10/05/2018: Lives [place] husband, daughter, and 2 grandchildren [place]. Lives on main level though mostly.   Has three children altogether, all local, supportive   Currently getting ramp installed in house d/t pt difficulty getting upstairs.       Vitals:   01/02/19 0856  BP: (!) 150/88  Pulse: 67  Resp: 16  Temp: 98.2 F (36.8 C)  SpO2: 97%   Body mass index is 59.16 kg/m.    Physical Exam  Nursing note and vitals reviewed. Constitutional: She is oriented to person, place, and time. She appears well-developed. No distress.  HENT:  Head: Normocephalic and atraumatic.  Mouth/Throat: Oropharynx is clear and moist and mucous membranes are normal.  Eyes: Pupils are equal, round, and reactive to light. Conjunctivae are normal.  Cardiovascular: Normal rate and regular rhythm.  No murmur heard. Pulses:      Dorsalis pedis pulses are 2+ on the right side and 2+ on the left side.  Respiratory: Effort normal and breath sounds normal. No respiratory distress.  GI: Soft. There is no abdominal tenderness.  Musculoskeletal:        General: No edema.  Lymphadenopathy:    She has no cervical adenopathy.  Neurological: She is alert  and oriented to person, place, and time. She has normal strength. No cranial nerve deficit. Gait (Assisted with a walker) abnormal.  Skin: Skin is warm. No rash noted. No erythema.  Psychiatric: She has a normal mood and affect.  Well groomed, good eye contact.    ASSESSMENT AND PLAN:   Ms. Holly Landngela was seen today for hypertension.  Diagnoses and all orders for this visit:  Essential hypertension BP poorly controlled. Lisinopril increased from 20 mg to 40 mg. Continue HCTZ 25 mg daily and amlodipine 5 mg daily. We discussed side effects of medications. Adverse effects of chronic NSAID use also discussed. Recommend monitoring BP at home. Continue low-salt diet. Instructed about warning signs. Follow-up in 2 months.  Generalized osteoarthritis of multiple sites (left hip,knee,and lower back) We discussed side effects of NSAIDs, including risk of bleeding and CV disease. Diclofenac seems to be helping with pain, so she would like to continue it. She will resume Cymbalta 60 mg daily. Also recommend taking Tylenol 500 mg 3-4 times per day. Follow-up in 2 months.  Frontal headache Neurologic exam today does not suggest a serious process.I think it may be related to allergies. Nasal saline irrigations as needed. Instructed about warning signs.   Return in about 2 months (around 03/04/2019) for HTN.  -[name] advised to return sooner than planned today if new concerns arise.     Angalina Ante G. Swazil[place]Jordan, MD  [facility]. [place] office.

## 2019-01-01 NOTE — Telephone Encounter (Signed)
Patient scheduled for ov for 01/02/2019 with PCP.  Copied from [place] [id number]. Topic: Appointment Scheduling - Scheduling Inquiry for Clinic >> Jan 01, 2019 12:14 PM Richardo Priest, [place] wrote: Reason for CRM: Ms.Warrden, NP with [facility], calling in stating she would like the patient to have a virtual/in office visit in regards to her elevated BP. States it runs from 180-198/90-100. Please advise and call patient to schedule.

## 2019-01-02 ENCOUNTER — Encounter: Payer: Self-pay | Admitting: Family Medicine

## 2019-01-02 ENCOUNTER — Other Ambulatory Visit: Payer: Self-pay

## 2019-01-02 ENCOUNTER — Ambulatory Visit (INDEPENDENT_AMBULATORY_CARE_PROVIDER_SITE_OTHER): Payer: Medicare Other | Admitting: Family Medicine

## 2019-01-02 VITALS — BP 150/88 | HR 67 | Temp 98.2°F | Resp 16 | Ht 65.0 in | Wt 355.5 lb

## 2019-01-02 DIAGNOSIS — M159 Polyosteoarthritis, unspecified: Secondary | ICD-10-CM

## 2019-01-02 DIAGNOSIS — I10 Essential (primary) hypertension: Secondary | ICD-10-CM | POA: Diagnosis not present

## 2019-01-02 DIAGNOSIS — R51 Headache: Secondary | ICD-10-CM

## 2019-01-02 DIAGNOSIS — R519 Headache, unspecified: Secondary | ICD-10-CM

## 2019-01-02 MED ORDER — LISINOPRIL 40 MG PO TABS: 40.0000 mg | ORAL_TABLET | Freq: Every day | ORAL | 1 refills | Status: DC

## 2019-01-02 MED ORDER — DICLOFENAC SODIUM 75 MG PO TBEC: 75.0000 mg | DELAYED_RELEASE_TABLET | Freq: Two times a day (BID) | ORAL | 1 refills | Status: DC | PRN

## 2019-01-02 MED ORDER — HYDROCHLOROTHIAZIDE 25 MG PO TABS: 25.0000 mg | ORAL_TABLET | Freq: Every day | ORAL | 1 refills | Status: DC

## 2019-01-02 NOTE — Patient Instructions (Addendum)
A few things to remember from today's visit:   Essential hypertension - Plan: lisinopril (ZESTRIL) 40 MG tablet, hydrochlorothiazide (HYDRODIURIL) 25 MG tablet  Generalized osteoarthritis of multiple sites (left hip,knee,and lower back)  Resume Cymbalta 60 mg daily. Because diclofenac is helping with pain, you can continue it but remember it can increase your blood pressure" kidney problems. Also take Tylenol 500 mg 3-4 times per day. Lisinopril was increased from 20 mg to 40 mg. Hydrochlorothiazide 25 mg to continue. No changes in amlodipine. Monitor your blood pressure at home. I will see you back in 2 months.  Please be sure medication list is accurate. If a new problem present, please set up appointment sooner than planned today.

## 2019-01-02 NOTE — Assessment & Plan Note (Signed)
We discussed side effects of NSAIDs, including risk of bleeding and CV disease. Diclofenac seems to be helping with pain, so she would like to continue it. She will resume Cymbalta 60 mg daily. Also recommend taking Tylenol 500 mg 3-4 times per day. Follow-up in 2 months.

## 2019-01-02 NOTE — Assessment & Plan Note (Addendum)
BP poorly controlled. Lisinopril increased from 20 mg to 40 mg. Continue HCTZ 25 mg daily and amlodipine 5 mg daily. We discussed side effects of medications. Adverse effects of chronic NSAID use also discussed. Recommend monitoring BP at home. Continue low-salt diet. Instructed about warning signs. Follow-up in 2 months.

## 2019-01-09 ENCOUNTER — Telehealth (INDEPENDENT_AMBULATORY_CARE_PROVIDER_SITE_OTHER): Payer: Medicare Other | Admitting: Family Medicine

## 2019-01-09 ENCOUNTER — Other Ambulatory Visit: Payer: Self-pay

## 2019-01-09 ENCOUNTER — Encounter (INDEPENDENT_AMBULATORY_CARE_PROVIDER_SITE_OTHER): Payer: Self-pay | Admitting: Family Medicine

## 2019-01-09 DIAGNOSIS — F3289 Other specified depressive episodes: Secondary | ICD-10-CM | POA: Diagnosis not present

## 2019-01-09 DIAGNOSIS — I10 Essential (primary) hypertension: Secondary | ICD-10-CM

## 2019-01-09 DIAGNOSIS — Z6841 Body Mass Index (BMI) 40.0 and over, adult: Secondary | ICD-10-CM | POA: Diagnosis not present

## 2019-01-09 MED ORDER — AMLODIPINE BESYLATE 10 MG PO TABS: 10.0000 mg | ORAL_TABLET | Freq: Every day | ORAL | 3 refills | Status: DC

## 2019-01-09 MED ORDER — BUPROPION HCL ER (SR) 150 MG PO TB12: 150.0000 mg | ORAL_TABLET | Freq: Every day | ORAL | 0 refills | Status: DC

## 2019-01-13 DIAGNOSIS — M79609 Pain in unspecified limb: Secondary | ICD-10-CM | POA: Diagnosis not present

## 2019-01-13 DIAGNOSIS — I10 Essential (primary) hypertension: Secondary | ICD-10-CM | POA: Diagnosis not present

## 2019-01-13 DIAGNOSIS — M519 Unspecified thoracic, thoracolumbar and lumbosacral intervertebral disc disorder: Secondary | ICD-10-CM | POA: Diagnosis not present

## 2019-01-13 DIAGNOSIS — G4733 Obstructive sleep apnea (adult) (pediatric): Secondary | ICD-10-CM | POA: Diagnosis not present

## 2019-01-15 NOTE — Progress Notes (Signed)
Office: [phone]  /  Fax: [phone] TeleHealth Visit:  [name] has verbally consented to this TeleHealth visit today. The patient is located at home, the provider is located at the [facility] office. The participants in this visit include the listed provider and patient. The visit was conducted today via telephone call (Doxy.me failed - switched to telephone call).  HPI:   Chief Complaint: OBESITY [name] is here to discuss her progress with her obesity treatment plan. She is on the Category 2 plan and is following her eating plan approximately 60% of the time. She states she is exercising 0 minutes 0 times per week. [name] has done well maintaining her weight since her last visit and states she weighed 351 at home. She is deviating from her plan significantly, skipping some meals, and has increased her snacking. We were unable to weigh the patient today for this TeleHealth visit. She feels as if she has maintained her weight since her last visit. She has lost 11 lbs since starting treatment with us.  Hypertension Holly Blackbirdngela W Haney is a 63 y.o. female with hypertension. [name] is on amlodipine, lisinopril, and HCTZ and her blood pressure continues to increase.  Holly BlackbirdAngela W Oki denies chest pain or shortness of breath on exertion. She has uncontrolled sleep apnea and cannot use her mask. She is working weight loss to help control her blood pressure with the goal of decreasing her risk of heart attack and stroke. [name] states her blood pressure at home today was 183/104 and is not currently controlled.  Depression with emotional eating behaviors [name] is struggling with emotional eating and using food for comfort to the extent that it is negatively impacting her health. She often snacks when she is not hungry. [name] sometimes feels she is out of control and then feels guilty that she made poor food choices. She has been working on behavior modification techniques to help  reduce her emotional eating and has been somewhat successful. [name] reports her mood is stable. She is working on emotional eating but still struggles. She shows no sign of suicidal or homicidal ideations.  [facility] 10/05/2018 05/09/2018 09/29/2017 09/27/2017 08/18/2016  Decreased Interest 1 3 3 2  0  Down, Depressed, Hopeless 1 1 1  0 0  PHQ - 2 Score 2 4 4 2  0  Altered sleeping 2 2 2  0 -  Tired, decreased energy 2 3 2 2  -  Change in appetite 2 2 3 1  -  Feeling bad or failure about yourself  0 0 1 0 -  Trouble concentrating 0 1 2 0 -  Moving slowly or fidgety/restless 0 0 1 0 -  Suicidal thoughts 0 0 0 0 -  PHQ-9 Score 8 12 15 5  -  Difficult doing work/chores - Somewhat difficult Very difficult Very difficult -  Some recent data might be hidden   ASSESSMENT AND PLAN:  Essential hypertension  Other depression - with emotional eating - Plan: buPROPion (WELLBUTRIN SR) 150 MG 12 hr tablet  Class 3 severe obesity with serious comorbidity and body mass index (BMI) of 50.0 to 59.9 in adult, unspecified obesity type (HCC)  PLAN:  Hypertension We discussed sodium restriction, working on healthy weight loss, and a regular exercise program as the means to achieve improved blood pressure control. [name] agreed with this plan and agreed to follow up as directed. We will continue to monitor her blood pressure as well as her progress with the above lifestyle modifications. Holly LandAngela  will increase her Norvasc to 10 mg #90 with 3 refills. She will continue her other medications and will follow-up in 2 weeks. She will keep her upcoming sleep appointment to discuss other mask options. [name] will watch for signs of hypotension as she continues her lifestyle modifications.  Depression with Emotional Eating Behaviors We discussed behavior modification techniques today to help [name] deal with her emotional eating and depression. Chani was given a refill on her Wellbutrin 150 mg #30 with 0 refills  and agrees to follow-up with our clinic in 2 weeks.  I spent > than 50% of the 25 minute visit on counseling as documented in the note.  Obesity [name] is currently in the action stage of change. As such, her goal is to continue with weight loss efforts. She has agreed to follow the Category 2 plan. [name] has been instructed to work up to a goal of 150 minutes of combined cardio and strengthening exercise per week for weight loss and overall health benefits. We discussed the following Behavioral Modification Strategies today: increasing lean protein intake, increase H20 intake, decreasing sodium intake, no skipping meals, and emotional eating strategies.  [name] has agreed to follow-up with our clinic in 2 weeks. She was informed of the importance of frequent follow-up visits to maximize her success with intensive lifestyle modifications for her multiple health conditions.  ALLERGIES: Allergies  Allergen Reactions   Ketorolac Tromethamine Shortness Of Breath and Palpitations   Atrovent [Ipratropium] Hives and Rash   Latex Other (See Comments)    Powdered. Confirm type of reaction with patient.    MEDICATIONS: Current Outpatient Medications on File Prior to Visit  Medication Sig Dispense Refill   albuterol (PROVENTIL HFA;VENTOLIN HFA) 108 (90 Base) MCG/ACT inhaler Inhale 1-2 puffs into the lungs every 6 (six) hours as needed for wheezing or shortness of breath. 3 Inhaler 4   budesonide-formoterol (SYMBICORT) 160-4.5 MCG/ACT inhaler Inhale 2 puffs into the lungs 2 (two) times daily. 3 Inhaler 4   diclofenac (VOLTAREN) 75 MG EC tablet Take 1 tablet (75 mg total) by mouth 2 (two) times daily as needed. 40 tablet 1   DULoxetine (CYMBALTA) 60 MG capsule Take 1 capsule (60 mg total) by mouth daily. 90 capsule 1   hydrochlorothiazide (HYDRODIURIL) 25 MG tablet Take 1 tablet (25 mg total) by mouth daily. 90 tablet 1   levocetirizine (XYZAL) 5 MG tablet Take 1 tablet (5 mg total) by  mouth daily as needed for allergies.     lisinopril (ZESTRIL) 40 MG tablet Take 1 tablet (40 mg total) by mouth daily. 90 tablet 1   metFORMIN (GLUCOPHAGE) 500 MG tablet Take 1 tablet (500 mg total) by mouth 2 (two) times daily with a meal. 60 tablet 0   montelukast (SINGULAIR) 10 MG tablet Take 1 tablet (10 mg total) by mouth at bedtime. (Patient taking differently: Take 10 mg by mouth daily as needed (Asthma). ) 90 tablet 1   pantoprazole (PROTONIX) 40 MG tablet Take 1 tablet (40 mg total) by mouth daily as needed (for acid reflux).     tiZANidine (ZANAFLEX) 2 MG tablet Take 1 tablet (2 mg total) by mouth every 8 (eight) hours as needed for muscle spasms. 45 tablet 1   traMADol (ULTRAM) 50 MG tablet Take 50 mg by mouth 2 (two) times daily as needed. for pain     Vitamin D, Ergocalciferol, (DRISDOL) 1.25 MG (50000 UT) CAPS capsule Take 1 capsule (50,000 Units total) by mouth every 7 (seven) days. 4 capsule  0   zolpidem (AMBIEN) 5 MG tablet 1 at bedtime if needed for sleep 15 tablet 1   Zoster Vaccine Adjuvanted [facility]) injection 0.5 ml in muscle and repeat in 8 weeks 0.5 mL 1   No current facility-administered medications on file prior to visit.     PAST MEDICAL HISTORY: Past Medical History:  Diagnosis Date   Abnormal EKG 06/2003   History of inverted T waves V1-V3. Normal 2D echo (07/23/2003): LVEF 65%.   Anemia    BL Hgb 11-12. Ferritin 14 - low normal (08/2007). Colonoscopy 2009 - external hemorrhoids (excellent prep). Last anemia panel (12/2010) - Iron  24, TIBC 269,  B12  316, Folate 11.9, Ferritin 73.   Asthma    B12 deficiency    Back pain    Chest pain    Degenerative joint disease    BL knees (L>R), lumbar spine. Followed by Sports Medicine, Dr. Jennette Kettle.   Dyspnea    History of multiple pulmonary nodules    Incidental finding: CT Abd/ Pelvis (04/2010) - Several small lower lobe lung nodules, including one pure ground-glass pulmonary nodule measuring 8 mm in  the left lower lobe.  Recommend follow-up chest CT (IV contrast preferred) in 6 months to document stability. //  CT Abd/ Pelvis (07/2010) -  3 mm RLL and  8 mm LLL nodule stable.  Other nodules unchanged, likely benign.   Hyperlipidemia    Hypertension    Incarcerated ventral hernia 04/2010   Noted on CT Abd/ pelvis (04/2010). Patient now s/p ventral hernia repair by Dr. 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 2D echo (2005) - EF 65%.   Obesity    OSA (obstructive sleep apnea)    Palpitations    Positive D dimer    Right knee pain     PAST SURGICAL HISTORY: Past Surgical History:  Procedure Laterality Date   COLONOSCOPY  2009   COLONOSCOPY WITH PROPOFOL N/A 12/18/2017   Procedure: COLONOSCOPY WITH PROPOFOL;  Surgeon: Iva Boop, MD;  Location: [facility];  Service: Endoscopy;  Laterality: N/A;   FOOT SURGERY  2004    left   INCISION AND DRAINAGE ABSCESS ANAL  07/2008   I&D and debridgement of anorectal abscess   INCISIONAL HERNIA REPAIR  12/2010   Repair of incarcerated ventral incisional hernia with Ethicon mesh patch - performed by Dr. Gerrit Friends.    INGUINAL HERNIA REPAIR     JOINT REPLACEMENT Left 2013   left knee   TOTAL KNEE ARTHROPLASTY  06/16/2011   Left TOTAL KNEE ARTHROPLASTY;  Surgeon: Kennieth Rad;  Location: [facility] OR;  Service: Orthopedics;  Laterality: Left;  left total knee arthroplasty   TUBAL LIGATION      SOCIAL HISTORY: Social History   Tobacco Use   Smoking status: Former Smoker    Packs/day: 0.50    Years: 20.00    Pack years: 10.00    Types: Cigarettes    Quit date: 09/10/1998    Years since quitting: 20.3   Smokeless tobacco: Never Used   Tobacco comment: 63 yo   Substance Use Topics   Alcohol use: No    Alcohol/week: 0.0 standard drinks   Drug use:  No    FAMILY HISTORY: Family History  Problem Relation Age of  Onset   Diabetes Mother    Hypertension Mother    Stroke Mother    Hyperlipidemia Mother    Heart disease Mother    Sudden death Mother    Kidney disease Mother    Depression Mother    Dementia Father    Heart disease Father    Hyperlipidemia Father    Sudden death Father    Depression Father    Hypertension Sister    Diabetes Brother    Hypertension Brother    Rashes / Skin problems Maternal Grandfather    Heart attack Neg Hx    ROS: Review of Systems  Psychiatric/Behavioral: Positive for depression (emotional eating). Negative for suicidal ideas.       Negative for homicidal ideas.   PHYSICAL EXAM: Pt in no acute distress  RECENT LABS AND TESTS: BMET    Component Value Date/Time   NA 143 08/16/2018 1207   K 4.0 08/16/2018 1207   CL 100 08/16/2018 1207   CO2 23 08/16/2018 1207   GLUCOSE 99 08/16/2018 1207   GLUCOSE 92 05/02/2018 1124   BUN 6 (L) 08/16/2018 1207   CREATININE 0.78 08/16/2018 1207   CREATININE 0.74 09/15/2017 1519   CALCIUM 9.8 08/16/2018 1207   GFRNONAA 82 08/16/2018 1207   GFRNONAA 81 03/21/2013 1530   GFRAA 95 08/16/2018 1207   GFRAA >89 03/21/2013 1530   Lab Results  Component Value Date   HGBA1C 5.7 (H) 08/16/2018   HGBA1C 5.7 05/02/2018   HGBA1C 5.3 10/25/2017   HGBA1C 5.6 12/02/2016   HGBA1C 5.7 (H) 04/02/2016   Lab Results  Component Value Date   INSULIN 10.7 08/16/2018   INSULIN 12.2 05/09/2018   CBC    Component Value Date/Time   WBC 5.1 05/09/2018 1007   WBC 5.3 11/07/2017 1635   RBC 4.38 05/09/2018 1007   RBC 4.16 11/07/2017 1635   HGB 11.9 05/09/2018 1007   HCT 38.1 05/09/2018 1007   PLT 363.0 11/07/2017 1635   MCV 87 05/09/2018 1007   MCH 27.2 05/09/2018 1007   MCH 27.7 10/25/2017 0207   MCHC 31.2 (L) 05/09/2018 1007   MCHC 32.4 11/07/2017 1635   RDW 13.6 05/09/2018 1007   LYMPHSABS 1.7 05/09/2018 1007   MONOABS 0.4 11/07/2017  1635   EOSABS 0.3 05/09/2018 1007   BASOSABS 0.1 05/09/2018 1007   Iron/TIBC/Ferritin/ %Sat    Component Value Date/Time   IRON 47 10/25/2017 0859   TIBC 330 10/25/2017 0859   FERRITIN 15 10/25/2017 0859   IRONPCTSAT 14 10/25/2017 0859   IRONPCTSAT 17 (L) 11/17/2011 1656   Lipid Panel     Component Value Date/Time   CHOL 195 08/16/2018 1207   TRIG 85 08/16/2018 1207   HDL 45 08/16/2018 1207   CHOLHDL 4.6 10/25/2017 1208   VLDL 10 10/25/2017 1208   LDLCALC 133 (H) 08/16/2018 1207   Hepatic Function Panel     Component Value Date/Time   PROT 6.9 08/16/2018 1207   ALBUMIN 4.0 08/16/2018 1207   AST 11 08/16/2018 1207   ALT 10 08/16/2018 1207   ALKPHOS 117 08/16/2018 1207   BILITOT 0.3 08/16/2018 1207   BILIDIR 0.1 05/03/2010 0625   IBILI 0.3 05/03/2010 0625      Component Value Date/Time   TSH 1.900 05/09/2018 1007   TSH 3.440 10/25/2017 0859   TSH 1.307 07/16/2014 1202   TSH 1.821 11/02/2012 1235   Results for Holly BlackbirdVANS, Rhena W (MRN [id number]) as of 01/15/2019 08:13  Ref. Range 08/16/2018 12:07  Vitamin D, 25-Hydroxy Latest Ref Range: 30.0 - 100.0 ng/mL 22.2 (L)    I, Marianna Paymentenise Haag, am acting as Energy managertranscriptionist for Quillian Quincearen Acey [place], MD .Filbert Schildermmm

## 2019-01-17 ENCOUNTER — Other Ambulatory Visit: Payer: Self-pay | Admitting: Family Medicine

## 2019-01-17 ENCOUNTER — Ambulatory Visit
Admission: RE | Admit: 2019-01-17 | Discharge: 2019-01-17 | Disposition: A | Discharge status: Home / Self Care | Payer: Medicare Other | Source: Ambulatory Visit | Attending: Family Medicine | Admitting: Family Medicine

## 2019-01-17 ENCOUNTER — Other Ambulatory Visit: Payer: Self-pay

## 2019-01-17 DIAGNOSIS — N644 Mastodynia: Secondary | ICD-10-CM

## 2019-01-17 DIAGNOSIS — Z13820 Encounter for screening for osteoporosis: Secondary | ICD-10-CM | POA: Diagnosis not present

## 2019-01-17 DIAGNOSIS — Z780 Asymptomatic menopausal state: Secondary | ICD-10-CM | POA: Diagnosis not present

## 2019-01-17 DIAGNOSIS — Z1239 Encounter for other screening for malignant neoplasm of breast: Secondary | ICD-10-CM

## 2019-01-20 NOTE — Progress Notes (Signed)
HPI:   [name] is a 63 y.o. female, who is here today to follow on recent OV.  She was last seen on 01/02/2019, when antihypertensive medications were adjusted. Lisinopril was increased from 20 mg to 40 mg. Dr. Cira RueBesley recently increased her amlodipine from 5 mg to 10 mg. She reporting "better" BPs. 140s/90s. Negative for unusual headache, visual changes, chest pain, dyspnea, focal weakness, or worsening edema.   Lab Results  Component Value Date   CREATININE 0.78 08/16/2018   BUN 6 (L) 08/16/2018   NA 143 08/16/2018   K 4.0 08/16/2018   CL 100 08/16/2018   CO2 23 08/16/2018   Chronic pain/generalized OA: Currently she is on Cymbalta 60 mg daily, which she thinks has helped with pain intensity but pain is still affecting her daily activities. She has not been able to engage in regular physical activity due to pain. She is following dietary recommendations but has not noted significant changes in her weight.  Knee pain seems to be getting worse. Today she is in a wheelchair. Today she has an appointment with pain management. She is also following with healthy weight and wellness clinic.  Gynecologic preventive care:  Last pap smear: 03/2012. Abnormal pap smear: Denies. She lives with her husband. Currently she is not sexually active, due to pain.  Menarche at [age]. LMP 09/2009. G: 3 A: 0 L: 3 She did not breast-feed.  Last mammogram 09/09/2016, BI-RADS 1. She has an appointment for diagnostic mammogram 01/25/2019. She has been having some burning sensation around right nipple. Negative for nipple discharge, skin changes, or tenderness.   Review of Systems  Constitutional: Negative for chills and fever.  HENT: Negative for nosebleeds and sore throat.   Eyes: Negative for pain and redness.  Respiratory: Negative for cough and wheezing.   Gastrointestinal: Negative for abdominal pain, nausea and vomiting.  Endocrine: Negative for cold  intolerance, heat intolerance, polydipsia, polyphagia and polyuria.  Genitourinary: Negative for decreased urine volume, dysuria, hematuria, pelvic pain, vaginal bleeding, vaginal discharge and vaginal pain.  Musculoskeletal: Positive for gait problem.  Skin: Negative for rash and wound.  Allergic/Immunologic: Positive for environmental allergies.  Neurological: Negative for syncope and facial asymmetry.  Rest see pertinent positives and negatives per HPI.   Current Outpatient Medications on File Prior to Visit  Medication Sig Dispense Refill  . albuterol (PROVENTIL HFA;VENTOLIN HFA) 108 (90 Base) MCG/ACT inhaler Inhale 1-2 puffs into the lungs every 6 (six) hours as needed for wheezing or shortness of breath. 3 Inhaler 4  . amLODipine (NORVASC) 10 MG tablet Take 1 tablet (10 mg total) by mouth daily. 90 tablet 3  . budesonide-formoterol (SYMBICORT) 160-4.5 MCG/ACT inhaler Inhale 2 puffs into the lungs 2 (two) times daily. 3 Inhaler 4  . buPROPion (WELLBUTRIN SR) 150 MG 12 hr tablet Take 1 tablet (150 mg total) by mouth daily. 30 tablet 0  . diclofenac (VOLTAREN) 75 MG EC tablet Take 1 tablet (75 mg total) by mouth 2 (two) times daily as needed. 40 tablet 1  . DULoxetine (CYMBALTA) 60 MG capsule Take 1 capsule (60 mg total) by mouth daily. 90 capsule 1  . hydrochlorothiazide (HYDRODIURIL) 25 MG tablet Take 1 tablet (25 mg total) by mouth daily. 90 tablet 1  . levocetirizine (XYZAL) 5 MG tablet Take 1 tablet (5 mg total) by mouth daily as needed for allergies.    [name]. lisinopril (ZESTRIL) 40 MG tablet Take 1 tablet (40 mg total) by mouth daily.  90 tablet 1  . metFORMIN (GLUCOPHAGE) 500 MG tablet Take 1 tablet (500 mg total) by mouth 2 (two) times daily with a meal. 60 tablet 0  . montelukast (SINGULAIR) 10 MG tablet Take 1 tablet (10 mg total) by mouth at bedtime. (Patient taking differently: Take 10 mg by mouth daily as needed (Asthma). ) 90 tablet 1  . pantoprazole (PROTONIX) 40 MG tablet Take 1  tablet (40 mg total) by mouth daily as needed (for acid reflux).    [name]. tiZANidine (ZANAFLEX) 2 MG tablet Take 1 tablet (2 mg total) by mouth every 8 (eight) hours as needed for muscle spasms. 45 tablet 1  . traMADol (ULTRAM) 50 MG tablet Take 50 mg by mouth 2 (two) times daily as needed. for pain    . Vitamin D, Ergocalciferol, (DRISDOL) 1.25 MG (50000 UT) CAPS capsule Take 1 capsule (50,000 Units total) by mouth every 7 (seven) days. 4 capsule 0  . zolpidem (AMBIEN) 5 MG tablet 1 at bedtime if needed for sleep 15 tablet 1  . Zoster Vaccine Adjuvanted [facility](SHINGRIX) injection 0.5 ml in muscle and repeat in 8 weeks 0.5 mL 1   No current facility-administered medications on file prior to visit.      Past Medical History:  Diagnosis Date  . Abnormal EKG 06/2003   History of inverted T waves V1-V3. Normal 2D echo (07/23/2003): LVEF 65%.  . Anemia    BL Hgb 11-12. Ferritin 14 - low normal (08/2007). Colonoscopy 2009 - external hemorrhoids (excellent prep). Last anemia panel (12/2010) - Iron  24, TIBC 269,  B12  316, Folate 11.9, Ferritin 73.  . Asthma   . B12 deficiency   . Back pain   . Chest pain   . Degenerative joint disease    BL knees (L>R), lumbar spine. Followed by Sports Medicine, Dr. Jennette KettleNeal.  . Dyspnea   . History of multiple pulmonary nodules    Incidental finding: CT Abd/ Pelvis (04/2010) - Several small lower lobe lung nodules, including one pure ground-glass pulmonary nodule measuring 8 mm in the left lower lobe.  Recommend follow-up chest CT (IV contrast preferred) in 6 months to document stability. //  CT Abd/ Pelvis (07/2010) -  3 mm RLL and  8 mm LLL nodule stable.  Other nodules unchanged, likely benign.  . Hyperlipidemia   . Hypertension   . Incarcerated ventral hernia 04/2010   Noted on CT Abd/ pelvis (04/2010). Patient now s/p ventral hernia repair by Dr. Gerrit FriendsGerkin (12/2010)  . Insomnia   . 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 Left hip pain   .  Left ventricular diastolic dysfunction   . Leg edema   . Lower extremity edema    Chronic. 2D echo (2005) - EF 65%.  . Obesity   . OSA (obstructive sleep apnea)   . Palpitations   . Positive D dimer   . Right knee pain    Allergies  Allergen Reactions  . Ketorolac Tromethamine Shortness Of Breath and Palpitations  . Atrovent [Ipratropium] Hives and Rash  . Latex Other (See Comments)    Powdered. Confirm type of reaction with patient.    Social History   Socioeconomic History  . Marital status: Married    Spouse name: Gerarda FractionJeron  . Number of children: 3  . Years of education: Not on file  . Highest education level: Not on file  Occupational History  . Occupation: Stay at home  Social Needs  . Financial  resource strain: Not hard at all  . Food insecurity    Worry: Never true    Inability: Never true  . Transportation needs    Medical: No    Non-medical: No  Tobacco Use  . Smoking status: Former Smoker    Packs/day: 0.50    Years: 20.00    Pack years: 10.00    Types: Cigarettes    Quit date: 09/10/1998    Years since quitting: 20.3  . Smokeless tobacco: Never Used  . Tobacco comment: 63 yo   Substance and Sexual Activity  . Alcohol use: No    Alcohol/week: 0.0 standard drinks  . Drug use: No  . Sexual activity: Yes    Partners: Male  Lifestyle  . Physical activity    Days per week: 0 days    Minutes per session: 0 min  . Stress: Only a little  Relationships  . Social Herbalist on phone: Three times a week    Gets together: Twice a week    Attends religious service: Not on file    Active member of club or organization: Not on file    Attends meetings of clubs or organizations: Not on file    Relationship status: Married  Other Topics Concern  . Not on file  Social History Narrative   10/05/2018: Lives with husband, daughter, and 2 grandchildren in [place] house. Lives on main level though mostly.   Has three children altogether, all local,  supportive   Currently getting ramp installed in house d/t pt difficulty getting upstairs.       Vitals:   01/21/19 1214  BP: (!) 150/80  Pulse: 90  Resp: 16  Temp: 99.4 F (37.4 C)  SpO2: 96%   Body mass index is 60.74 kg/m.  Wt Readings from Last 3 Encounters:  01/21/19 (!) 365 lb (165.6 kg)  01/02/19 (!) 355 lb 8 oz (161.3 kg)  10/05/18 (!) 343 lb (155.6 kg)     Physical Exam  Nursing note and vitals reviewed. Constitutional: She is oriented to person, place, and time. She appears well-developed. No distress.  HENT:  Head: Normocephalic and atraumatic.  Mouth/Throat: Oropharynx is clear and moist and mucous membranes are normal.  Eyes: Pupils are equal, round, and reactive to light. Conjunctivae are normal.  Cardiovascular: Normal rate and regular rhythm.  No murmur heard. Pulses:      Dorsalis pedis pulses are 2+ on the right side and 2+ on the left side.  Respiratory: Effort normal and breath sounds normal. No respiratory distress. No breast swelling or tenderness.  GI: Soft. She exhibits no mass. There is no hepatomegaly. There is no abdominal tenderness.  Genitourinary: There is no tenderness or lesion on the right labia. There is no tenderness or lesion on the left labia. Uterus is not tender. Cervix exhibits no motion tenderness, no discharge and no friability. Right adnexum displays no tenderness and no fullness. Left adnexum displays no tenderness and no fullness.    No vaginal discharge, erythema or bleeding.  No erythema or bleeding in the vagina.    Genitourinary Comments: Breast: No masses, skin changes, or nipple discharge appreciated bilateral. Pap smear collected. Difficult pelvic exam due to body habits.   Musculoskeletal:        General: Edema (Trace pitting edema,bilateral) present.  Lymphadenopathy:    She has no cervical adenopathy.       Right: No inguinal adenopathy present.       Left: No inguinal  adenopathy present.  Neurological: She is  alert and oriented to person, place, and time. She has normal strength. No cranial nerve deficit. Gait abnormal.  She is in a wheel chair.  Skin: Skin is warm. No rash noted. No erythema.  Psychiatric: She has a normal mood and affect.  Well groomed, good eye contact.    ASSESSMENT AND PLAN:  Ms. Holly Landngela was seen today for follow-up and gynecologic exam.  Orders Placed This Encounter  Procedures  . Basic metabolic panel  . Ambulatory referral to General Surgery    Encounter for gynecological examination without abnormal finding Diagnoses and all orders for this visit: Encounter for gynecological examination without abnormal finding We discussed current recommendations in regard to breast and cervical cancer screening. Educated about the importance of regular physical activity and following a healthful diet for primary prevention.  Screening for malignant neoplasm of cervix -     Cytology - PAP ([place])   Generalized osteoarthritis of multiple sites (left hip,knee,and lower back) Problem is getting worse. Today she has an appointment with pain management. Weight loss will certainly help with slowing down progression.  Class 3 severe obesity with serious comorbidity and body mass index (BMI) of 50.0 to 59.9 in adult [facility](HCC) She is following dietary recommendations. Unable to exercise regularly due to pain. I think she will benefit from bariatric surgery, she agrees with having initial evaluation, referral placed. Continue following with Dr.Beasley.  Essential hypertension Improved but not yet at goal. Re-checked 160/90.  Today spironolactone 25 mg daily was added. Continue HCTZ 25 mg daily, amlodipine 10 mg daily, and lisinopril 40 mg daily. Continue monitoring BP regularly. Possible complications of elevated BP discussed. Low-salt diet. Follow-up in 4 months, before if needed.    Return in 4 months (on 05/24/2019) for HTN.   Izzy Courville G. Swazil[place], MD  [facility] [place] office.

## 2019-01-21 ENCOUNTER — Ambulatory Visit (INDEPENDENT_AMBULATORY_CARE_PROVIDER_SITE_OTHER): Payer: Medicare Other | Admitting: Family Medicine

## 2019-01-21 ENCOUNTER — Other Ambulatory Visit: Payer: Self-pay

## 2019-01-21 ENCOUNTER — Encounter: Payer: Self-pay | Admitting: Family Medicine

## 2019-01-21 ENCOUNTER — Other Ambulatory Visit (HOSPITAL_COMMUNITY)
Admission: RE | Admit: 2019-01-21 | Discharge: 2019-01-21 | Disposition: A | Discharge status: Home / Self Care | Payer: Medicare Other | Source: Ambulatory Visit | Attending: Family Medicine | Admitting: Family Medicine

## 2019-01-21 VITALS — BP 150/80 | HR 90 | Temp 99.4°F | Resp 16 | Ht 65.0 in | Wt 365.0 lb

## 2019-01-21 DIAGNOSIS — I10 Essential (primary) hypertension: Secondary | ICD-10-CM | POA: Diagnosis not present

## 2019-01-21 DIAGNOSIS — M159 Polyosteoarthritis, unspecified: Secondary | ICD-10-CM | POA: Diagnosis not present

## 2019-01-21 DIAGNOSIS — G894 Chronic pain syndrome: Secondary | ICD-10-CM | POA: Diagnosis not present

## 2019-01-21 DIAGNOSIS — Z124 Encounter for screening for malignant neoplasm of cervix: Secondary | ICD-10-CM | POA: Diagnosis not present

## 2019-01-21 DIAGNOSIS — Z01419 Encounter for gynecological examination (general) (routine) without abnormal findings: Secondary | ICD-10-CM | POA: Diagnosis not present

## 2019-01-21 DIAGNOSIS — M1612 Unilateral primary osteoarthritis, left hip: Secondary | ICD-10-CM | POA: Diagnosis not present

## 2019-01-21 DIAGNOSIS — M5416 Radiculopathy, lumbar region: Secondary | ICD-10-CM | POA: Diagnosis not present

## 2019-01-21 DIAGNOSIS — M47816 Spondylosis without myelopathy or radiculopathy, lumbar region: Secondary | ICD-10-CM | POA: Diagnosis not present

## 2019-01-21 DIAGNOSIS — Z780 Asymptomatic menopausal state: Secondary | ICD-10-CM | POA: Diagnosis not present

## 2019-01-21 DIAGNOSIS — Z1151 Encounter for screening for human papillomavirus (HPV): Secondary | ICD-10-CM | POA: Diagnosis not present

## 2019-01-21 DIAGNOSIS — Z6841 Body Mass Index (BMI) 40.0 and over, adult: Secondary | ICD-10-CM

## 2019-01-21 DIAGNOSIS — Z79891 Long term (current) use of opiate analgesic: Secondary | ICD-10-CM | POA: Diagnosis not present

## 2019-01-21 MED ORDER — SPIRONOLACTONE 25 MG PO TABS: 25.0000 mg | ORAL_TABLET | Freq: Every day | ORAL | 1 refills | Status: DC

## 2019-01-21 NOTE — Patient Instructions (Addendum)
A few things to remember from today's visit:   Essential hypertension  Screening for malignant neoplasm of cervix - Plan: Cytology - PAP ([place])  Today you have you routine preventive visit.  At least 150 minutes of moderate exercise per week, daily brisk walking for 15-30 min is a good exercise option. Healthy diet low in saturated (animal) fats and sweets and consisting of fresh fruits and vegetables, lean meats such as fish and white chicken and whole grains.  These are some of recommendations for screening depending of age and risk factors:   - Vaccines:  Tdap vaccine every 10 years.  Shingles vaccine recommended at age 53, could be given after 62 years of age but not sure about insurance coverage.   Pneumonia vaccines:  Prevnar 13 at 65 and Pneumovax at 63. Sometimes Pneumovax is giving earlier if history of smoking, lung disease,diabetes,kidney disease among some.    Screening for diabetes at age 43 and every 3 years.  Cervical cancer prevention:  Pap smear starts at 62 years of age and continues periodically until 62 years old in low risk women. Pap smear every 3 years between 29 and 89 years old. Pap smear every 3-5 years between women 18 and older if pap smear negative and HPV screening negative.   -Breast cancer: Mammogram: There is disagreement between experts about when to start screening in low risk asymptomatic female but recent recommendations are to start screening at 106 and not later than 62 years old , every 1-2 years and after 62 yo q 2 years. Screening is recommended until 62 years old but some women can continue screening depending of healthy issues.   Colon cancer screening: starts at 62 years old until 62 years old.  Also recommended:  1. Dental visit- Brush and floss your teeth twice daily; visit your dentist twice a year. 2. Eye doctor- Get an eye exam at least every 2 years. 3. Helmet use- Always wear a helmet when riding a bicycle, motorcycle,  rollerblading or skateboarding. 4. Safe sex- If you may be exposed to sexually transmitted infections, use a condom. 5. Seat belts- Seat belts can save your live; always wear one. 6. Smoke/Carbon Monoxide detectors- These detectors need to be installed on the appropriate level of your home. Replace batteries at least once a year. 7. Skin cancer- When out in the sun please cover up and use sunscreen 15 SPF or higher. 8. Violence- If anyone is threatening or hurting you, please tell your healthcare provider.  9. Drink alcohol in moderation- Limit alcohol intake to one drink or less per day. Never drink and drive.  Please be sure medication list is accurate. If a new problem present, please set up appointment sooner than planned today.

## 2019-01-21 NOTE — Assessment & Plan Note (Addendum)
She is following dietary recommendations. Unable to exercise regularly due to pain. I think she will benefit from bariatric surgery, she agrees with having initial evaluation, referral placed. Continue following with Dr.Beasley.

## 2019-01-21 NOTE — Assessment & Plan Note (Signed)
Problem is getting worse. Today she has an appointment with pain management. Weight loss will certainly help with slowing down progression.

## 2019-01-21 NOTE — Assessment & Plan Note (Addendum)
Improved but not yet at goal. Re-checked 160/90.  Today spironolactone 25 mg daily was added. Continue HCTZ 25 mg daily, amlodipine 10 mg daily, and lisinopril 40 mg daily. Continue monitoring BP regularly. Possible complications of elevated BP discussed. Low-salt diet. Follow-up in 4 months, before if needed.

## 2019-01-24 ENCOUNTER — Telehealth (INDEPENDENT_AMBULATORY_CARE_PROVIDER_SITE_OTHER): Payer: Medicare Other | Admitting: Family Medicine

## 2019-01-24 ENCOUNTER — Other Ambulatory Visit: Payer: Self-pay

## 2019-01-24 ENCOUNTER — Encounter (INDEPENDENT_AMBULATORY_CARE_PROVIDER_SITE_OTHER): Payer: Self-pay | Admitting: Family Medicine

## 2019-01-24 DIAGNOSIS — I10 Essential (primary) hypertension: Secondary | ICD-10-CM | POA: Diagnosis not present

## 2019-01-24 DIAGNOSIS — E559 Vitamin D deficiency, unspecified: Secondary | ICD-10-CM

## 2019-01-24 DIAGNOSIS — R7303 Prediabetes: Secondary | ICD-10-CM | POA: Diagnosis not present

## 2019-01-24 DIAGNOSIS — Z6841 Body Mass Index (BMI) 40.0 and over, adult: Secondary | ICD-10-CM

## 2019-01-24 LAB — CYTOLOGY - PAP
Diagnosis: NEGATIVE
HPV: NOT DETECTED

## 2019-01-24 MED ORDER — VITAMIN D (ERGOCALCIFEROL) 1.25 MG (50000 UNIT) PO CAPS: 50000.0000 [IU] | ORAL_CAPSULE | ORAL | 0 refills | Status: DC

## 2019-01-24 MED ORDER — METFORMIN HCL 500 MG PO TABS: 500.0000 mg | ORAL_TABLET | Freq: Two times a day (BID) | ORAL | 0 refills | Status: DC

## 2019-01-25 ENCOUNTER — Ambulatory Visit
Admission: RE | Admit: 2019-01-25 | Discharge: 2019-01-25 | Disposition: A | Discharge status: Home / Self Care | Payer: Medicare Other | Source: Ambulatory Visit | Attending: Family Medicine | Admitting: Family Medicine

## 2019-01-25 ENCOUNTER — Other Ambulatory Visit: Payer: Self-pay | Admitting: Family Medicine

## 2019-01-25 ENCOUNTER — Other Ambulatory Visit: Payer: Self-pay

## 2019-01-25 DIAGNOSIS — R599 Enlarged lymph nodes, unspecified: Secondary | ICD-10-CM

## 2019-01-25 DIAGNOSIS — N6459 Other signs and symptoms in breast: Secondary | ICD-10-CM | POA: Diagnosis not present

## 2019-01-25 DIAGNOSIS — N644 Mastodynia: Secondary | ICD-10-CM | POA: Diagnosis not present

## 2019-01-25 DIAGNOSIS — R928 Other abnormal and inconclusive findings on diagnostic imaging of breast: Secondary | ICD-10-CM | POA: Diagnosis not present

## 2019-01-28 ENCOUNTER — Encounter: Payer: Self-pay | Admitting: Family Medicine

## 2019-01-28 NOTE — Progress Notes (Signed)
Office: [phone]  /  Fax: [phone] TeleHealth Visit:  [name] has verbally consented to this TeleHealth visit today. The patient is located at home, the provider is located at the [facility] office. The participants in this visit include the listed provider and patient. The visit was conducted today via telephone call (FaceTime failed - changed to telephone call).  HPI:   Chief Complaint: OBESITY [name] is here to discuss her progress with her obesity treatment plan. She is on the Category 2 plan and is following her eating plan approximately 70% of the time. She states she is exercising 0 minutes 0 times per week. [name] still struggles to follow her plan. She states her hunger is controlled and she is trying to follow her plan, but can't list exactly what is on her plan. I suspect she is not really following her plan but trying to PC/[place] instead, which helps with weight maintenance but not weight loss. Her PCP, Dr. [name], referred her to have weight loss surgery. We were unable to weigh the patient today for this TeleHealth visit. She feels as if she has maintained her weight since her last visit. She has lost 11 lbs since starting treatment with [place].  Hypertension [name] is a 63 y.o. female with hypertension. She saw her PCP, Dr. [name], recently and had spironolactone added to her HCTZ 25, amlodipine 10, and lisinopril 40 as her blood pressure was 160/90 in her office. The patient states her blood pressure was 150/80 yesterday but hasn't started the spironolactone yet.  Pre-Diabetes [name] has a diagnosis of prediabetes based on her elevated Hgb A1c and was informed this puts her at greater risk of developing diabetes. She is working on diet and weight loss but has not been able to stay with the plan closely.  Vitamin D deficiency [name] has a diagnosis of Vitamin D deficiency, which is not yet at goal. She is currently stable on  prescription Vit D and denies nausea, vomiting or muscle weakness but does report fatigue.  ASSESSMENT AND PLAN:  Prediabetes - Plan: metFORMIN (GLUCOPHAGE) 500 MG tablet  Essential hypertension  Vitamin D deficiency - Plan: Vitamin D, Ergocalciferol, (DRISDOL) 1.25 MG (50000 UT) CAPS capsule  Class 3 severe obesity with serious comorbidity and body mass index (BMI) of 50.0 to 59.9 in adult, unspecified obesity type ([name])  PLAN:  Hypertension We discussed sodium restriction, working on healthy weight loss, and a regular exercise program as the means to achieve improved blood pressure control. [name] was strongly encouraged to pick up the medication and start taking it. I suspect she is struggling with compliance issues possibly due to finances or transportation. We will continue to follow and encourage her. She will watch for signs of hypotension as she continues her lifestyle modifications.  Pre-Diabetes [name] will continue to work on weight loss, exercise, and decreasing simple carbohydrates in her diet to help decrease the risk of diabetes. We dicussed metformin including benefits and risks. She was informed that eating too many simple carbohydrates or too many calories at one sitting increases the likelihood of GI side effects. [name] was given a refill on her metformin 500 mg #60 with 0 refills and agrees to follow-up with our clinic in 2 weeks. She will continue with diet and exercise.  Vitamin D Deficiency [name] was informed that low Vitamin D levels contributes to fatigue and are associated with obesity, breast, and colon cancer. She agrees to continue to take prescription Vit D @  50,000 IU every week #4 with 0 refills and will follow-up for routine testing of Vitamin D at her next in-office visit. She was informed of the risk of over-replacement of Vitamin D and agrees to not increase her dose unless she discusses this with us first. [name] agrees to follow-up with our clinic in 2  weeks.  I spent > than 50% of the 25 minute visit on counseling as documented in the note.  Obesity [name] is currently in the action stage of change. As such, her goal is to continue with weight loss efforts. She has agreed to follow the Category 2 plan. [name] has been instructed to work up to a goal of 150 minutes of combined cardio and strengthening exercise per week for weight loss and overall health benefits. We discussed the following Behavioral Modification Strategies today: increasing lean protein intake, increase H20 intake, decreasing sodium intake, work on meal planning and easy cooking plans, and planning for success.  [name] has agreed to follow-up with our clinic in 2 weeks. She was informed of the importance of frequent follow-up visits to maximize her success with intensive lifestyle modifications for her multiple health conditions.  ALLERGIES: Allergies  Allergen Reactions  . Ketorolac Tromethamine Shortness Of Breath and Palpitations  . Atrovent [Ipratropium] Hives and Rash  . Latex Other (See Comments)    Powdered. Confirm type of reaction with patient.    MEDICATIONS: Current Outpatient Medications on File Prior to Visit  Medication Sig Dispense Refill  . albuterol (PROVENTIL HFA;VENTOLIN HFA) 108 (90 Base) MCG/ACT inhaler Inhale 1-2 puffs into the lungs every 6 (six) hours as needed for wheezing or shortness of breath. 3 Inhaler 4  . amLODipine (NORVASC) 10 MG tablet Take 1 tablet (10 mg total) by mouth daily. 90 tablet 3  . budesonide-formoterol (SYMBICORT) 160-4.5 MCG/ACT inhaler Inhale 2 puffs into the lungs 2 (two) times daily. 3 Inhaler 4  . buPROPion (WELLBUTRIN SR) 150 MG 12 hr tablet Take 1 tablet (150 mg total) by mouth daily. 30 tablet 0  . diclofenac (VOLTAREN) 75 MG EC tablet Take 1 tablet (75 mg total) by mouth 2 (two) times daily as needed. 40 tablet 1  . DULoxetine (CYMBALTA) 60 MG capsule Take 1 capsule (60 mg total) by mouth daily. 90 capsule 1  .  hydrochlorothiazide (HYDRODIURIL) 25 MG tablet Take 1 tablet (25 mg total) by mouth daily. 90 tablet 1  . levocetirizine (XYZAL) 5 MG tablet Take 1 tablet (5 mg total) by mouth daily as needed for allergies.    [name]. lisinopril (ZESTRIL) 40 MG tablet Take 1 tablet (40 mg total) by mouth daily. 90 tablet 1  . montelukast (SINGULAIR) 10 MG tablet Take 1 tablet (10 mg total) by mouth at bedtime. (Patient taking differently: Take 10 mg by mouth daily as needed (Asthma). ) 90 tablet 1  . pantoprazole (PROTONIX) 40 MG tablet Take 1 tablet (40 mg total) by mouth daily as needed (for acid reflux).    [name]. spironolactone (ALDACTONE) 25 MG tablet Take 1 tablet (25 mg total) by mouth daily. 90 tablet 1  . tiZANidine (ZANAFLEX) 2 MG tablet Take 1 tablet (2 mg total) by mouth every 8 (eight) hours as needed for muscle spasms. 45 tablet 1  . traMADol (ULTRAM) 50 MG tablet Take 50 mg by mouth 2 (two) times daily as needed. for pain    . zolpidem (AMBIEN) 5 MG tablet 1 at bedtime if needed for sleep 15 tablet 1  . Zoster Vaccine Adjuvanted [facility](SHINGRIX)  injection 0.5 ml in muscle and repeat in 8 weeks 0.5 mL 1   No current facility-administered medications on file prior to visit.     PAST MEDICAL HISTORY: Past Medical History:  Diagnosis Date  . Abnormal EKG 06/2003   History of inverted T waves V1-V3. Normal 2D echo (07/23/2003): LVEF 65%.  . Anemia    BL Hgb 11-12. Ferritin 14 - low normal (08/2007). Colonoscopy 2009 - external hemorrhoids (excellent prep). Last anemia panel (12/2010) - Iron  24, TIBC 269,  B12  316, Folate 11.9, Ferritin 73.  . Asthma   . B12 deficiency   . Back pain   . Chest pain   . Degenerative joint disease    BL knees (L>R), lumbar spine. Followed by Sports Medicine, Dr. Jennette KettleNeal.  . Dyspnea   . History of multiple pulmonary nodules    Incidental finding: CT Abd/ Pelvis (04/2010) - Several small lower lobe lung nodules, including one pure ground-glass pulmonary nodule measuring 8 mm in the  left lower lobe.  Recommend follow-up chest CT (IV contrast preferred) in 6 months to document stability. //  CT Abd/ Pelvis (07/2010) -  3 mm RLL and  8 mm LLL nodule stable.  Other nodules unchanged, likely benign.  . Hyperlipidemia   . Hypertension   . Incarcerated ventral hernia 04/2010   Noted on CT Abd/ pelvis (04/2010). Patient now s/p ventral hernia repair by Dr. Gerrit FriendsGerkin (12/2010)  . Insomnia   . 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 Left hip pain   . Left ventricular diastolic dysfunction   . Leg edema   . Lower extremity edema    Chronic. 2D echo (2005) - EF 65%.  . Obesity   . OSA (obstructive sleep apnea)   . Palpitations   . Positive D dimer   . Right knee pain     PAST SURGICAL HISTORY: Past Surgical History:  Procedure Laterality Date  . COLONOSCOPY  2009  . COLONOSCOPY WITH PROPOFOL N/A 12/18/2017   Procedure: COLONOSCOPY WITH PROPOFOL;  Surgeon: Iva BoopGessner, Carl E, MD;  Location: [facility];  Service: Endoscopy;  Laterality: N/A;  . FOOT SURGERY  2004    left  . INCISION AND DRAINAGE ABSCESS ANAL  07/2008   I&D and debridgement of anorectal abscess  . INCISIONAL HERNIA REPAIR  12/2010   Repair of incarcerated ventral incisional hernia with Ethicon mesh patch - performed by Dr. Gerrit FriendsGerkin.   . INGUINAL HERNIA REPAIR    . JOINT REPLACEMENT Left 2013   left knee  . TOTAL KNEE ARTHROPLASTY  06/16/2011   Left TOTAL KNEE ARTHROPLASTY;  Surgeon: Kennieth RadArthur F Carter;  Location: [facility] OR;  Service: Orthopedics;  Laterality: Left;  left total knee arthroplasty  . TUBAL LIGATION      SOCIAL HISTORY: Social History   Tobacco Use  . Smoking status: Former Smoker    Packs/day: 0.50    Years: 20.00    Pack years: 10.00    Types: Cigarettes    Quit date: 09/10/1998    Years since quitting: 20.3  . Smokeless tobacco: Never Used  . Tobacco comment: 63 yo   Substance Use Topics  . Alcohol use: No    Alcohol/week: 0.0 standard drinks  . Drug use: No     FAMILY HISTORY: Family History  Problem Relation Age of Onset  . Diabetes Mother   . Hypertension Mother   . Stroke Mother   . Hyperlipidemia Mother   . Heart  disease Mother   . Sudden death Mother   . Kidney disease Mother   . Depression Mother   . Dementia Father   . Heart disease Father   . Hyperlipidemia Father   . Sudden death Father   . Depression Father   . Hypertension Sister   . Diabetes Brother   . Hypertension Brother   . Rashes / Skin problems Maternal Grandfather   . Heart attack Neg Hx    ROS: Review of Systems  Constitutional: Positive for malaise/fatigue.  Gastrointestinal: Negative for nausea and vomiting.  Musculoskeletal:       Negative for muscle weakness.   PHYSICAL EXAM: Pt in no acute distress  RECENT LABS AND TESTS: BMET    Component Value Date/Time   NA 143 08/16/2018 1207   K 4.0 08/16/2018 1207   CL 100 08/16/2018 1207   CO2 23 08/16/2018 1207   GLUCOSE 99 08/16/2018 1207   GLUCOSE 92 05/02/2018 1124   BUN 6 (L) 08/16/2018 1207   CREATININE 0.78 08/16/2018 1207   CREATININE 0.74 09/15/2017 1519   CALCIUM 9.8 08/16/2018 1207   GFRNONAA 82 08/16/2018 1207   GFRNONAA 81 03/21/2013 1530   GFRAA 95 08/16/2018 1207   GFRAA >89 03/21/2013 1530   Lab Results  Component Value Date   HGBA1C 5.7 (H) 08/16/2018   HGBA1C 5.7 05/02/2018   HGBA1C 5.3 10/25/2017   HGBA1C 5.6 12/02/2016   HGBA1C 5.7 (H) 04/02/2016   Lab Results  Component Value Date   INSULIN 10.7 08/16/2018   INSULIN 12.2 05/09/2018   CBC    Component Value Date/Time   WBC 5.1 05/09/2018 1007   WBC 5.3 11/07/2017 1635   RBC 4.38 05/09/2018 1007   RBC 4.16 11/07/2017 1635   HGB 11.9 05/09/2018 1007   HCT 38.1 05/09/2018 1007   PLT 363.0 11/07/2017 1635   MCV 87 05/09/2018 1007   MCH 27.2 05/09/2018 1007   MCH 27.7 10/25/2017 0207   MCHC 31.2 (L) 05/09/2018 1007   MCHC 32.4 11/07/2017 1635   RDW 13.6 05/09/2018 1007   LYMPHSABS 1.7 05/09/2018 1007    MONOABS 0.4 11/07/2017 1635   EOSABS 0.3 05/09/2018 1007   BASOSABS 0.1 05/09/2018 1007   Iron/TIBC/Ferritin/ %Sat    Component Value Date/Time   IRON 47 10/25/2017 0859   TIBC 330 10/25/2017 0859   FERRITIN 15 10/25/2017 0859   IRONPCTSAT 14 10/25/2017 0859   IRONPCTSAT 17 (L) 11/17/2011 1656   Lipid Panel     Component Value Date/Time   CHOL 195 08/16/2018 1207   TRIG 85 08/16/2018 1207   HDL 45 08/16/2018 1207   CHOLHDL 4.6 10/25/2017 1208   VLDL 10 10/25/2017 1208   LDLCALC 133 (H) 08/16/2018 1207   Hepatic Function Panel     Component Value Date/Time   PROT 6.9 08/16/2018 1207   ALBUMIN 4.0 08/16/2018 1207   AST 11 08/16/2018 1207   ALT 10 08/16/2018 1207   ALKPHOS 117 08/16/2018 1207   BILITOT 0.3 08/16/2018 1207   BILIDIR 0.1 05/03/2010 0625   IBILI 0.3 05/03/2010 0625      Component Value Date/Time   TSH 1.900 05/09/2018 1007   TSH 3.440 10/25/2017 0859   TSH 1.307 07/16/2014 1202   TSH 1.821 11/02/2012 1235   Results for Lajoyce LauberVANS, Cadance WEBSTER (MRN [id number]) as of 01/28/2019 11:55  Ref. Range 08/16/2018 12:07  Vitamin D, 25-Hydroxy Latest Ref Range: 30.0 - 100.0 ng/mL 22.2 (L)   I, Marianna Paymentenise Haag, am acting as  transcriptionist for Dennard Nip, MD  I have reviewed the above documentation for accuracy and completeness, and I agree with the above. -Dennard Nip, MD

## 2019-01-29 ENCOUNTER — Other Ambulatory Visit: Payer: Medicare Other

## 2019-01-31 ENCOUNTER — Other Ambulatory Visit: Payer: Self-pay | Admitting: Physical Medicine and Rehabilitation

## 2019-01-31 DIAGNOSIS — M5416 Radiculopathy, lumbar region: Secondary | ICD-10-CM

## 2019-02-05 ENCOUNTER — Ambulatory Visit
Admission: RE | Admit: 2019-02-05 | Discharge: 2019-02-05 | Disposition: A | Discharge status: Home / Self Care | Payer: Medicare Other | Source: Ambulatory Visit | Attending: Family Medicine | Admitting: Family Medicine

## 2019-02-05 ENCOUNTER — Other Ambulatory Visit (HOSPITAL_COMMUNITY): Admission: RE | Admit: 2019-02-05 | Payer: Medicare Other | Source: Ambulatory Visit

## 2019-02-05 ENCOUNTER — Other Ambulatory Visit: Payer: Self-pay

## 2019-02-05 DIAGNOSIS — R590 Localized enlarged lymph nodes: Secondary | ICD-10-CM | POA: Diagnosis not present

## 2019-02-05 DIAGNOSIS — R599 Enlarged lymph nodes, unspecified: Secondary | ICD-10-CM

## 2019-02-07 ENCOUNTER — Telehealth (INDEPENDENT_AMBULATORY_CARE_PROVIDER_SITE_OTHER): Payer: Medicare Other | Admitting: Family Medicine

## 2019-02-07 ENCOUNTER — Other Ambulatory Visit: Payer: Self-pay

## 2019-02-07 ENCOUNTER — Encounter (INDEPENDENT_AMBULATORY_CARE_PROVIDER_SITE_OTHER): Payer: Self-pay | Admitting: Family Medicine

## 2019-02-07 DIAGNOSIS — Z6841 Body Mass Index (BMI) 40.0 and over, adult: Secondary | ICD-10-CM | POA: Diagnosis not present

## 2019-02-07 DIAGNOSIS — F3289 Other specified depressive episodes: Secondary | ICD-10-CM | POA: Diagnosis not present

## 2019-02-07 MED ORDER — BUPROPION HCL ER (SR) 150 MG PO TB12: 150.0000 mg | ORAL_TABLET | Freq: Every day | ORAL | 0 refills | Status: DC

## 2019-02-11 NOTE — Progress Notes (Signed)
Office: [phone]  /  Fax: [phone] TeleHealth Visit:  [name] has verbally consented to this TeleHealth visit today. The patient is located at home, the provider is located at the [facility] office. The participants in this visit include the listed provider and patient. [name] was unable to use realtime audiovisual technology today and the telehealth visit was conducted via telephone.   HPI:   Chief Complaint: OBESITY [name] is here to discuss her progress with her obesity treatment plan. She is on the Category 2 plan and is following her eating plan approximately 70 % of the time. She states she is exercising 0 minutes 0 times per week. [name] states she continues to maintain her weight, but her last weight taken on 7/13 at her GYN shows a 20 lbs weight gain since her last in office visit 5 months ago. She states her hunger is controlled, but is deviating from her plan more.  We were unable to weigh the patient today for this TeleHealth visit. She feels as if she has maintained her weight since her last visit. She has lost 11 lbs since starting treatment with us.  Depression with Emotional Eating Behaviors [name] states she still struggles with emotional eating, but feels the Wellbutrin has helped. She was offered a slightly higher dose, but she preferred to keep it the same dose. She using food for comfort to the extent that it is negatively impacting her health. She often snacks when she is not hungry. [name] sometimes feels she is out of control and then feels guilty that she made poor food choices. She has been working on behavior modification techniques to help reduce her emotional eating and has been somewhat successful. She shows no sign of suicidal or homicidal ideations.  Depression screen [facility] 10/05/2018 05/09/2018 09/29/2017 09/27/2017 08/18/2016  Decreased Interest 1 3 3 2  0  Down, Depressed, Hopeless 1 1 1  0 0  PHQ - 2 Score 2 4 4 2  0  Altered  sleeping 2 2 2  0 -  Tired, decreased energy 2 3 2 2  -  Change in appetite 2 2 3 1  -  Feeling bad or failure about yourself  0 0 1 0 -  Trouble concentrating 0 1 2 0 -  Moving slowly or fidgety/restless 0 0 1 0 -  Suicidal thoughts 0 0 0 0 -  PHQ-9 Score 8 12 15 5  -  Difficult doing work/chores - Somewhat difficult Very difficult Very difficult -  Some recent data might be hidden    ASSESSMENT AND PLAN:  Other depression - with emotional eating - Plan: buPROPion (WELLBUTRIN SR) 150 MG 12 hr tablet  Class 3 severe obesity with serious comorbidity and body mass index (BMI) of 50.0 to 59.9 in adult, unspecified obesity type (HCC)  PLAN:  Depression with Emotional Eating Behaviors We discussed behavior modification techniques today to help [name] deal with her emotional eating and depression. [name] agrees to continue taking Wellbutrin SR 150 mg PO daily #30 and we will refill for 1 month, and she will continue to work on emotional eating. [name] agrees to follow up with our clinic in 2 weeks.  I spent > than 50% of the 15 minute visit on counseling as documented in the note.  Obesity [name] is currently in the action stage of change. As such, her goal is to continue with weight loss efforts She has agreed to follow the Category 2 plan [name] has been instructed to work up to  a goal of 150 minutes of combined cardio and strengthening exercise per week for weight loss and overall health benefits. We discussed the following Behavioral Modification Strategies today: work on meal planning and easy cooking plans, emotional eating strategies, keeping healthy foods in the home, and better snacking choices   Lauryl has agreed to follow up with our clinic in 2 weeks. She was informed of the importance of frequent follow up visits to maximize her success with intensive lifestyle modifications for her multiple health conditions.  ALLERGIES: Allergies  Allergen Reactions  . Ketorolac  Tromethamine Shortness Of Breath and Palpitations  . Atrovent [Ipratropium] Hives and Rash  . Latex Other (See Comments)    Powdered. Confirm type of reaction with patient.    MEDICATIONS: Current Outpatient Medications on File Prior to Visit  Medication Sig Dispense Refill  . albuterol (PROVENTIL HFA;VENTOLIN HFA) 108 (90 Base) MCG/ACT inhaler Inhale 1-2 puffs into the lungs every 6 (six) hours as needed for wheezing or shortness of breath. 3 Inhaler 4  . amLODipine (NORVASC) 10 MG tablet Take 1 tablet (10 mg total) by mouth daily. 90 tablet 3  . budesonide-formoterol (SYMBICORT) 160-4.5 MCG/ACT inhaler Inhale 2 puffs into the lungs 2 (two) times daily. 3 Inhaler 4  . diclofenac (VOLTAREN) 75 MG EC tablet Take 1 tablet (75 mg total) by mouth 2 (two) times daily as needed. 40 tablet 1  . DULoxetine (CYMBALTA) 60 MG capsule Take 1 capsule (60 mg total) by mouth daily. 90 capsule 1  . hydrochlorothiazide (HYDRODIURIL) 25 MG tablet Take 1 tablet (25 mg total) by mouth daily. 90 tablet 1  . levocetirizine (XYZAL) 5 MG tablet Take 1 tablet (5 mg total) by mouth daily as needed for allergies.    [name] lisinopril (ZESTRIL) 40 MG tablet Take 1 tablet (40 mg total) by mouth daily. 90 tablet 1  . metFORMIN (GLUCOPHAGE) 500 MG tablet Take 1 tablet (500 mg total) by mouth 2 (two) times daily with a meal. 60 tablet 0  . montelukast (SINGULAIR) 10 MG tablet Take 1 tablet (10 mg total) by mouth at bedtime. (Patient taking differently: Take 10 mg by mouth daily as needed (Asthma). ) 90 tablet 1  . pantoprazole (PROTONIX) 40 MG tablet Take 1 tablet (40 mg total) by mouth daily as needed (for acid reflux).    [name] spironolactone (ALDACTONE) 25 MG tablet Take 1 tablet (25 mg total) by mouth daily. 90 tablet 1  . tiZANidine (ZANAFLEX) 2 MG tablet Take 1 tablet (2 mg total) by mouth every 8 (eight) hours as needed for muscle spasms. 45 tablet 1  . traMADol (ULTRAM) 50 MG tablet Take 50 mg by mouth 2 (two) times daily as  needed. for pain    . Vitamin D, Ergocalciferol, (DRISDOL) 1.25 MG (50000 UT) CAPS capsule Take 1 capsule (50,000 Units total) by mouth every 7 (seven) days. 4 capsule 0  . zolpidem (AMBIEN) 5 MG tablet 1 at bedtime if needed for sleep 15 tablet 1  . Zoster Vaccine Adjuvanted [facility]) injection 0.5 ml in muscle and repeat in 8 weeks 0.5 mL 1   No current facility-administered medications on file prior to visit.     PAST MEDICAL HISTORY: Past Medical History:  Diagnosis Date  . Abnormal EKG 06/2003   History of inverted T waves V1-V3. Normal 2D echo (07/23/2003): LVEF 65%.  . Anemia    BL Hgb 11-12. Ferritin 14 - low normal (08/2007). Colonoscopy 2009 - external hemorrhoids (excellent prep). Last anemia panel (12/2010) -  Iron  24, TIBC 269,  B12  316, Folate 11.9, Ferritin 73.  . Asthma   . B12 deficiency   . Back pain   . Chest pain   . Degenerative joint disease    BL knees (L>R), lumbar spine. Followed by Sports Medicine, Dr. Jennette KettleNeal.  . Dyspnea   . History of multiple pulmonary nodules    Incidental finding: CT Abd/ Pelvis (04/2010) - Several small lower lobe lung nodules, including one pure ground-glass pulmonary nodule measuring 8 mm in the left lower lobe.  Recommend follow-up chest CT (IV contrast preferred) in 6 months to document stability. //  CT Abd/ Pelvis (07/2010) -  3 mm RLL and  8 mm LLL nodule stable.  Other nodules unchanged, likely benign.  . Hyperlipidemia   . Hypertension   . Incarcerated ventral hernia 04/2010   Noted on CT Abd/ pelvis (04/2010). Patient now s/p ventral hernia repair by Dr. Gerrit FriendsGerkin (12/2010)  . Insomnia   . 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 Left hip pain   . Left ventricular diastolic dysfunction   . Leg edema   . Lower extremity edema    Chronic. 2D echo (2005) - EF 65%.  . Obesity   . OSA (obstructive sleep apnea)   . Palpitations   . Positive D dimer   . Right knee pain     PAST SURGICAL HISTORY:  Past Surgical History:  Procedure Laterality Date  . COLONOSCOPY  2009  . COLONOSCOPY WITH PROPOFOL N/A 12/18/2017   Procedure: COLONOSCOPY WITH PROPOFOL;  Surgeon: Iva BoopGessner, Carl E, MD;  Location: [facility];  Service: Endoscopy;  Laterality: N/A;  . FOOT SURGERY  2004    left  . INCISION AND DRAINAGE ABSCESS ANAL  07/2008   I&D and debridgement of anorectal abscess  . INCISIONAL HERNIA REPAIR  12/2010   Repair of incarcerated ventral incisional hernia with Ethicon mesh patch - performed by Dr. Gerrit FriendsGerkin.   . INGUINAL HERNIA REPAIR    . JOINT REPLACEMENT Left 2013   left knee  . TOTAL KNEE ARTHROPLASTY  06/16/2011   Left TOTAL KNEE ARTHROPLASTY;  Surgeon: Kennieth RadArthur F Carter;  Location: [facility];  Service: Orthopedics;  Laterality: Left;  left total knee arthroplasty  . TUBAL LIGATION      SOCIAL HISTORY: Social History   Tobacco Use  . Smoking status: Former Smoker    Packs/day: 0.50    Years: 20.00    Pack years: 10.00    Types: Cigarettes    Quit date: 09/10/1998    Years since quitting: 20.4  . Smokeless tobacco: Never Used  . Tobacco comment: 63 yo   Substance Use Topics  . Alcohol use: No    Alcohol/week: 0.0 standard drinks  . Drug use: No    FAMILY HISTORY: Family History  Problem Relation Age of Onset  . Diabetes Mother   . Hypertension Mother   . Stroke Mother   . Hyperlipidemia Mother   . Heart disease Mother   . Sudden death Mother   . Kidney disease Mother   . Depression Mother   . Dementia Father   . Heart disease Father   . Hyperlipidemia Father   . Sudden death Father   . Depression Father   . Hypertension Sister   . Diabetes Brother   . Hypertension Brother   . Rashes / Skin problems Maternal Grandfather   . Heart attack Neg Hx     ROS: Review of  Systems  Constitutional: Negative for weight loss.  Psychiatric/Behavioral: Positive for depression. Negative for suicidal ideas.    PHYSICAL EXAM: Pt in no acute distress  RECENT LABS AND TESTS:  BMET    Component Value Date/Time   NA 143 08/16/2018 1207   K 4.0 08/16/2018 1207   CL 100 08/16/2018 1207   CO2 23 08/16/2018 1207   GLUCOSE 99 08/16/2018 1207   GLUCOSE 92 05/02/2018 1124   BUN 6 (L) 08/16/2018 1207   CREATININE 0.78 08/16/2018 1207   CREATININE 0.74 09/15/2017 1519   CALCIUM 9.8 08/16/2018 1207   GFRNONAA 82 08/16/2018 1207   GFRNONAA 81 03/21/2013 1530   GFRAA 95 08/16/2018 1207   GFRAA >89 03/21/2013 1530   Lab Results  Component Value Date   HGBA1C 5.7 (H) 08/16/2018   HGBA1C 5.7 05/02/2018   HGBA1C 5.3 10/25/2017   HGBA1C 5.6 12/02/2016   HGBA1C 5.7 (H) 04/02/2016   Lab Results  Component Value Date   INSULIN 10.7 08/16/2018   INSULIN 12.2 05/09/2018   CBC    Component Value Date/Time   WBC 5.1 05/09/2018 1007   WBC 5.3 11/07/2017 1635   RBC 4.38 05/09/2018 1007   RBC 4.16 11/07/2017 1635   HGB 11.9 05/09/2018 1007   HCT 38.1 05/09/2018 1007   PLT 363.0 11/07/2017 1635   MCV 87 05/09/2018 1007   MCH 27.2 05/09/2018 1007   MCH 27.7 10/25/2017 0207   MCHC 31.2 (L) 05/09/2018 1007   MCHC 32.4 11/07/2017 1635   RDW 13.6 05/09/2018 1007   LYMPHSABS 1.7 05/09/2018 1007   MONOABS 0.4 11/07/2017 1635   EOSABS 0.3 05/09/2018 1007   BASOSABS 0.1 05/09/2018 1007   Iron/TIBC/Ferritin/ %Sat    Component Value Date/Time   IRON 47 10/25/2017 0859   TIBC 330 10/25/2017 0859   FERRITIN 15 10/25/2017 0859   IRONPCTSAT 14 10/25/2017 0859   IRONPCTSAT 17 (L) 11/17/2011 1656   Lipid Panel     Component Value Date/Time   CHOL 195 08/16/2018 1207   TRIG 85 08/16/2018 1207   HDL 45 08/16/2018 1207   CHOLHDL 4.6 10/25/2017 1208   VLDL 10 10/25/2017 1208   LDLCALC 133 (H) 08/16/2018 1207   Hepatic Function Panel     Component Value Date/Time   PROT 6.9 08/16/2018 1207   ALBUMIN 4.0 08/16/2018 1207   AST 11 08/16/2018 1207   ALT 10 08/16/2018 1207   ALKPHOS 117 08/16/2018 1207   BILITOT 0.3 08/16/2018 1207   BILIDIR 0.1 05/03/2010 0625    IBILI 0.3 05/03/2010 0625      Component Value Date/Time   TSH 1.900 05/09/2018 1007   TSH 3.440 10/25/2017 0859   TSH 1.307 07/16/2014 1202   TSH 1.821 11/02/2012 1235      I, Burt KnackSharon Martin, am acting as Energy managertranscriptionist for Quillian Quincearen Jori Frerichs, MD I have reviewed the above documentation for accuracy and completeness, and I agree with the above. -Quillian Quincearen Aedon Deason, MD

## 2019-02-13 DIAGNOSIS — G4733 Obstructive sleep apnea (adult) (pediatric): Secondary | ICD-10-CM | POA: Diagnosis not present

## 2019-02-13 DIAGNOSIS — M79609 Pain in unspecified limb: Secondary | ICD-10-CM | POA: Diagnosis not present

## 2019-02-13 DIAGNOSIS — I10 Essential (primary) hypertension: Secondary | ICD-10-CM | POA: Diagnosis not present

## 2019-02-13 DIAGNOSIS — M519 Unspecified thoracic, thoracolumbar and lumbosacral intervertebral disc disorder: Secondary | ICD-10-CM | POA: Diagnosis not present

## 2019-02-15 ENCOUNTER — Ambulatory Visit: Payer: Medicare Other | Admitting: Internal Medicine

## 2019-02-21 ENCOUNTER — Encounter (INDEPENDENT_AMBULATORY_CARE_PROVIDER_SITE_OTHER): Payer: Self-pay | Admitting: Family Medicine

## 2019-02-21 ENCOUNTER — Telehealth (INDEPENDENT_AMBULATORY_CARE_PROVIDER_SITE_OTHER): Payer: Medicare Other | Admitting: Family Medicine

## 2019-02-21 ENCOUNTER — Other Ambulatory Visit: Payer: Self-pay

## 2019-02-21 ENCOUNTER — Ambulatory Visit
Admission: RE | Admit: 2019-02-21 | Discharge: 2019-02-21 | Disposition: A | Discharge status: Home / Self Care | Payer: Medicare Other | Source: Ambulatory Visit | Attending: Physical Medicine and Rehabilitation | Admitting: Physical Medicine and Rehabilitation

## 2019-02-21 DIAGNOSIS — M545 Low back pain, unspecified: Secondary | ICD-10-CM

## 2019-02-21 DIAGNOSIS — Z6841 Body Mass Index (BMI) 40.0 and over, adult: Secondary | ICD-10-CM

## 2019-02-21 DIAGNOSIS — M48061 Spinal stenosis, lumbar region without neurogenic claudication: Secondary | ICD-10-CM | POA: Diagnosis not present

## 2019-02-21 DIAGNOSIS — G8929 Other chronic pain: Secondary | ICD-10-CM

## 2019-02-21 DIAGNOSIS — M5416 Radiculopathy, lumbar region: Secondary | ICD-10-CM

## 2019-02-25 NOTE — Progress Notes (Signed)
Office: [phone]  /  Fax: [phone] TeleHealth Visit:  [name] has verbally consented to this TeleHealth visit today. The patient is located at home, the provider is located at the [facility]. The participants in this visit include the listed provider and patient. [name] was unable to use doxy.me today and the telehealth visit was conducted via telephone.  HPI:   Chief Complaint: OBESITY [name] is here to discuss her progress with her obesity treatment plan. She is on the Category 2 plan and is following her eating plan approximately 75 % of the time. She states she is trying to walk a bit more. [name] states that she is maintaining her weight and is trying to follow her plan at least in spirit, but is still deviating significantly. She has tried to increase vegetables and lean protein, but has increased snacking on sugary foods.  We were unable to weigh the patient today for this TeleHealth visit. She feels as if she her weight is stable since her last visit. She has lost 11 lbs since starting treatment with [place].  Chronic Back Pain [name] is unable to exercise due to back pain. She had an MRI this morning with results pending. [name] denies loss of bowel or bladder function.  ASSESSMENT AND PLAN:  Chronic low back pain, unspecified back pain laterality, unspecified whether sciatica present  Class 3 severe obesity with serious comorbidity and body mass index (BMI) of 50.0 to 59.9 in adult, unspecified obesity type ([name])  PLAN:  Chronic Back Pain [name] continues to work on weight loss. We will monitor and follow up with exercise recommendations once the MRI results are back. [name] agrees to follow up in 2 weeks.  I spent > than 50% of the 15 minute visit on counseling as documented in the note.  Obesity [name] is currently in the action stage of change. As such, her goal is to maintain weight for now. She has agreed to follow the Category 2  plan. [name] has been instructed to work up to a goal of 150 minutes of combined cardio and strengthening exercise per week for weight loss and overall health benefits. We discussed the following Behavioral Modification Strategies today: work on meal planning and easy cooking plans, better snacking choices, and emotional eating strategies.  [name] has agreed to follow up with our clinic in 2 weeks. She was informed of the importance of frequent follow up visits to maximize her success with intensive lifestyle modifications for her multiple health conditions.  ALLERGIES: Allergies  Allergen Reactions  . Ketorolac Tromethamine Shortness Of Breath and Palpitations  . Atrovent [Ipratropium] Hives and Rash  . Latex Other (See Comments)    Powdered. Confirm type of reaction with patient.    MEDICATIONS: Current Outpatient Medications on File Prior to Visit  Medication Sig Dispense Refill  . albuterol (PROVENTIL HFA;VENTOLIN HFA) 108 (90 Base) MCG/ACT inhaler Inhale 1-2 puffs into the lungs every 6 (six) hours as needed for wheezing or shortness of breath. 3 Inhaler 4  . amLODipine (NORVASC) 10 MG tablet Take 1 tablet (10 mg total) by mouth daily. 90 tablet 3  . budesonide-formoterol (SYMBICORT) 160-4.5 MCG/ACT inhaler Inhale 2 puffs into the lungs 2 (two) times daily. 3 Inhaler 4  . buPROPion (WELLBUTRIN SR) 150 MG 12 hr tablet Take 1 tablet (150 mg total) by mouth daily. 30 tablet 0  . diclofenac (VOLTAREN) 75 MG EC tablet Take 1 tablet (75 mg total) by mouth 2 (two) times daily  as needed. 40 tablet 1  . DULoxetine (CYMBALTA) 60 MG capsule Take 1 capsule (60 mg total) by mouth daily. 90 capsule 1  . hydrochlorothiazide (HYDRODIURIL) 25 MG tablet Take 1 tablet (25 mg total) by mouth daily. 90 tablet 1  . levocetirizine (XYZAL) 5 MG tablet Take 1 tablet (5 mg total) by mouth daily as needed for allergies.    [name]. lisinopril (ZESTRIL) 40 MG tablet Take 1 tablet (40 mg total) by mouth daily. 90 tablet  1  . metFORMIN (GLUCOPHAGE) 500 MG tablet Take 1 tablet (500 mg total) by mouth 2 (two) times daily with a meal. 60 tablet 0  . montelukast (SINGULAIR) 10 MG tablet Take 1 tablet (10 mg total) by mouth at bedtime. (Patient taking differently: Take 10 mg by mouth daily as needed (Asthma). ) 90 tablet 1  . pantoprazole (PROTONIX) 40 MG tablet Take 1 tablet (40 mg total) by mouth daily as needed (for acid reflux).    [name]. spironolactone (ALDACTONE) 25 MG tablet Take 1 tablet (25 mg total) by mouth daily. 90 tablet 1  . tiZANidine (ZANAFLEX) 2 MG tablet Take 1 tablet (2 mg total) by mouth every 8 (eight) hours as needed for muscle spasms. 45 tablet 1  . traMADol (ULTRAM) 50 MG tablet Take 50 mg by mouth 2 (two) times daily as needed. for pain    . Vitamin D, Ergocalciferol, (DRISDOL) 1.25 MG (50000 UT) CAPS capsule Take 1 capsule (50,000 Units total) by mouth every 7 (seven) days. 4 capsule 0  . zolpidem (AMBIEN) 5 MG tablet 1 at bedtime if needed for sleep 15 tablet 1  . Zoster Vaccine Adjuvanted [facility](SHINGRIX) injection 0.5 ml in muscle and repeat in 8 weeks 0.5 mL 1   No current facility-administered medications on file prior to visit.     PAST MEDICAL HISTORY: Past Medical History:  Diagnosis Date  . Abnormal EKG 06/2003   History of inverted T waves V1-V3. Normal 2D echo (07/23/2003): LVEF 65%.  . Anemia    BL Hgb 11-12. Ferritin 14 - low normal (08/2007). Colonoscopy 2009 - external hemorrhoids (excellent prep). Last anemia panel (12/2010) - Iron  24, TIBC 269,  B12  316, Folate 11.9, Ferritin 73.  . Asthma   . B12 deficiency   . Back pain   . Chest pain   . Degenerative joint disease    BL knees (L>R), lumbar spine. Followed by Sports Medicine, Dr. Jennette KettleNeal.  . Dyspnea   . History of multiple pulmonary nodules    Incidental finding: CT Abd/ Pelvis (04/2010) - Several small lower lobe lung nodules, including one pure ground-glass pulmonary nodule measuring 8 mm in the left lower lobe.  Recommend  follow-up chest CT (IV contrast preferred) in 6 months to document stability. //  CT Abd/ Pelvis (07/2010) -  3 mm RLL and  8 mm LLL nodule stable.  Other nodules unchanged, likely benign.  . Hyperlipidemia   . Hypertension   . Incarcerated ventral hernia 04/2010   Noted on CT Abd/ pelvis (04/2010). Patient now s/p ventral hernia repair by Dr. Gerrit FriendsGerkin (12/2010)  . Insomnia   . 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 Left hip pain   . Left ventricular diastolic dysfunction   . Leg edema   . Lower extremity edema    Chronic. 2D echo (2005) - EF 65%.  . Obesity   . OSA (obstructive sleep apnea)   . Palpitations   . Positive D dimer   .  Right knee pain     PAST SURGICAL HISTORY: Past Surgical History:  Procedure Laterality Date  . COLONOSCOPY  2009  . COLONOSCOPY WITH PROPOFOL N/A 12/18/2017   Procedure: COLONOSCOPY WITH PROPOFOL;  Surgeon: Iva BoopGessner, Carl E, MD;  Location: [facility];  Service: Endoscopy;  Laterality: N/A;  . FOOT SURGERY  2004    left  . INCISION AND DRAINAGE ABSCESS ANAL  07/2008   I&D and debridgement of anorectal abscess  . INCISIONAL HERNIA REPAIR  12/2010   Repair of incarcerated ventral incisional hernia with Ethicon mesh patch - performed by Dr. Gerrit FriendsGerkin.   . INGUINAL HERNIA REPAIR    . JOINT REPLACEMENT Left 2013   left knee  . TOTAL KNEE ARTHROPLASTY  06/16/2011   Left TOTAL KNEE ARTHROPLASTY;  Surgeon: Kennieth RadArthur F Carter;  Location: [facility];  Service: Orthopedics;  Laterality: Left;  left total knee arthroplasty  . TUBAL LIGATION      SOCIAL HISTORY: Social History   Tobacco Use  . Smoking status: Former Smoker    Packs/day: 0.50    Years: 20.00    Pack years: 10.00    Types: Cigarettes    Quit date: 09/10/1998    Years since quitting: 20.4  . Smokeless tobacco: Never Used  . Tobacco comment: 63 yo   Substance Use Topics  . Alcohol use: No    Alcohol/week: 0.0 standard drinks  . Drug use: No    FAMILY HISTORY: Family  History  Problem Relation Age of Onset  . Diabetes Mother   . Hypertension Mother   . Stroke Mother   . Hyperlipidemia Mother   . Heart disease Mother   . Sudden death Mother   . Kidney disease Mother   . Depression Mother   . Dementia Father   . Heart disease Father   . Hyperlipidemia Father   . Sudden death Father   . Depression Father   . Hypertension Sister   . Diabetes Brother   . Hypertension Brother   . Rashes / Skin problems Maternal Grandfather   . Heart attack Neg Hx     ROS: Review of Systems  Gastrointestinal:       Negative for loss of bowel function.  Genitourinary:       Negative for loss of bladder function.  Musculoskeletal: Positive for back pain.    PHYSICAL EXAM: Pt in no acute distress  RECENT LABS AND TESTS: BMET    Component Value Date/Time   NA 143 08/16/2018 1207   K 4.0 08/16/2018 1207   CL 100 08/16/2018 1207   CO2 23 08/16/2018 1207   GLUCOSE 99 08/16/2018 1207   GLUCOSE 92 05/02/2018 1124   BUN 6 (L) 08/16/2018 1207   CREATININE 0.78 08/16/2018 1207   CREATININE 0.74 09/15/2017 1519   CALCIUM 9.8 08/16/2018 1207   GFRNONAA 82 08/16/2018 1207   GFRNONAA 81 03/21/2013 1530   GFRAA 95 08/16/2018 1207   GFRAA >89 03/21/2013 1530   Lab Results  Component Value Date   HGBA1C 5.7 (H) 08/16/2018   HGBA1C 5.7 05/02/2018   HGBA1C 5.3 10/25/2017   HGBA1C 5.6 12/02/2016   HGBA1C 5.7 (H) 04/02/2016   Lab Results  Component Value Date   INSULIN 10.7 08/16/2018   INSULIN 12.2 05/09/2018   CBC    Component Value Date/Time   WBC 5.1 05/09/2018 1007   WBC 5.3 11/07/2017 1635   RBC 4.38 05/09/2018 1007   RBC 4.16 11/07/2017 1635   HGB 11.9 05/09/2018 1007  HCT 38.1 05/09/2018 1007   PLT 363.0 11/07/2017 1635   MCV 87 05/09/2018 1007   MCH 27.2 05/09/2018 1007   MCH 27.7 10/25/2017 0207   MCHC 31.2 (L) 05/09/2018 1007   MCHC 32.4 11/07/2017 1635   RDW 13.6 05/09/2018 1007   LYMPHSABS 1.7 05/09/2018 1007   MONOABS 0.4  11/07/2017 1635   EOSABS 0.3 05/09/2018 1007   BASOSABS 0.1 05/09/2018 1007   Iron/TIBC/Ferritin/ %Sat    Component Value Date/Time   IRON 47 10/25/2017 0859   TIBC 330 10/25/2017 0859   FERRITIN 15 10/25/2017 0859   IRONPCTSAT 14 10/25/2017 0859   IRONPCTSAT 17 (L) 11/17/2011 1656   Lipid Panel     Component Value Date/Time   CHOL 195 08/16/2018 1207   TRIG 85 08/16/2018 1207   HDL 45 08/16/2018 1207   CHOLHDL 4.6 10/25/2017 1208   VLDL 10 10/25/2017 1208   LDLCALC 133 (H) 08/16/2018 1207   Hepatic Function Panel     Component Value Date/Time   PROT 6.9 08/16/2018 1207   ALBUMIN 4.0 08/16/2018 1207   AST 11 08/16/2018 1207   ALT 10 08/16/2018 1207   ALKPHOS 117 08/16/2018 1207   BILITOT 0.3 08/16/2018 1207   BILIDIR 0.1 05/03/2010 0625   IBILI 0.3 05/03/2010 0625      Component Value Date/Time   TSH 1.900 05/09/2018 1007   TSH 3.440 10/25/2017 0859   TSH 1.307 07/16/2014 1202   TSH 1.821 11/02/2012 1235   Results for Curfman, Danijah WEBSTER "Tarryn Colasanti" (MRN [id number]) as of 02/25/2019 16:53  Ref. Range 08/16/2018 12:07  Vitamin D, 25-Hydroxy Latest Ref Range: 30.0 - 100.0 ng/mL 22.2 (L)    I, Kirke Corinara Soares, CMA, am acting as transcriptionist for Wilder Gladearen D. Beasley, MD I have reviewed the above documentation for accuracy and completeness, and I agree with the above. -Quillian Quincearen Beasley, MD

## 2019-03-01 DIAGNOSIS — M1612 Unilateral primary osteoarthritis, left hip: Secondary | ICD-10-CM | POA: Diagnosis not present

## 2019-03-01 DIAGNOSIS — G894 Chronic pain syndrome: Secondary | ICD-10-CM | POA: Diagnosis not present

## 2019-03-01 DIAGNOSIS — M5416 Radiculopathy, lumbar region: Secondary | ICD-10-CM | POA: Diagnosis not present

## 2019-03-01 DIAGNOSIS — M47816 Spondylosis without myelopathy or radiculopathy, lumbar region: Secondary | ICD-10-CM | POA: Diagnosis not present

## 2019-03-04 ENCOUNTER — Ambulatory Visit: Payer: Medicare Other | Admitting: Family Medicine

## 2019-03-06 ENCOUNTER — Encounter: Payer: Self-pay | Admitting: Family Medicine

## 2019-03-06 ENCOUNTER — Ambulatory Visit: Payer: Medicare Other | Admitting: Family Medicine

## 2019-03-08 ENCOUNTER — Other Ambulatory Visit: Payer: Self-pay

## 2019-03-08 ENCOUNTER — Telehealth: Payer: Self-pay | Admitting: *Deleted

## 2019-03-08 ENCOUNTER — Telehealth: Payer: Medicare Other | Admitting: Family Medicine

## 2019-03-08 NOTE — Telephone Encounter (Signed)
Tried calling patient because pcp tried to connect with her at 3 pm and patient didn't show. Returned call to patient and informed her that per last ov per Dr. [name] to follow-up in 4 months and that she did not need to discuss anything, she could follow-up in November. Patient stated that she did not have any new to discus with pcp. Nothing further needed at this time.  Copied from [place] [id number]. Topic: General - Call Back - No Documentation >> Mar 08, 2019  3:28 PM Erick Blinks wrote: Reason for CRM: Pt requesting call back from [place]. Please advise Best contact: [phone]

## 2019-03-13 ENCOUNTER — Other Ambulatory Visit: Payer: Self-pay

## 2019-03-13 ENCOUNTER — Telehealth (INDEPENDENT_AMBULATORY_CARE_PROVIDER_SITE_OTHER): Payer: Medicare Other | Admitting: Family Medicine

## 2019-03-13 ENCOUNTER — Encounter (INDEPENDENT_AMBULATORY_CARE_PROVIDER_SITE_OTHER): Payer: Self-pay

## 2019-03-13 ENCOUNTER — Encounter (INDEPENDENT_AMBULATORY_CARE_PROVIDER_SITE_OTHER): Payer: Self-pay | Admitting: Family Medicine

## 2019-03-13 DIAGNOSIS — Z6841 Body Mass Index (BMI) 40.0 and over, adult: Secondary | ICD-10-CM

## 2019-03-13 DIAGNOSIS — F3289 Other specified depressive episodes: Secondary | ICD-10-CM

## 2019-03-13 DIAGNOSIS — E559 Vitamin D deficiency, unspecified: Secondary | ICD-10-CM

## 2019-03-13 DIAGNOSIS — R600 Localized edema: Secondary | ICD-10-CM

## 2019-03-13 DIAGNOSIS — R7303 Prediabetes: Secondary | ICD-10-CM

## 2019-03-13 MED ORDER — BUPROPION HCL ER (SR) 150 MG PO TB12: 150.0000 mg | ORAL_TABLET | Freq: Every day | ORAL | 0 refills | Status: DC

## 2019-03-13 MED ORDER — VITAMIN D (ERGOCALCIFEROL) 1.25 MG (50000 UNIT) PO CAPS: 50000.0000 [IU] | ORAL_CAPSULE | ORAL | 0 refills | Status: DC

## 2019-03-13 MED ORDER — METFORMIN HCL 500 MG PO TABS: 500.0000 mg | ORAL_TABLET | Freq: Two times a day (BID) | ORAL | 0 refills | Status: DC

## 2019-03-14 ENCOUNTER — Encounter (INDEPENDENT_AMBULATORY_CARE_PROVIDER_SITE_OTHER): Payer: Self-pay | Admitting: Family Medicine

## 2019-03-14 DIAGNOSIS — H33311 Horseshoe tear of retina without detachment, right eye: Secondary | ICD-10-CM | POA: Diagnosis not present

## 2019-03-14 DIAGNOSIS — H35371 Puckering of macula, right eye: Secondary | ICD-10-CM | POA: Diagnosis not present

## 2019-03-14 DIAGNOSIS — H25813 Combined forms of age-related cataract, bilateral: Secondary | ICD-10-CM | POA: Diagnosis not present

## 2019-03-14 DIAGNOSIS — H21342 Primary cyst of pars plana, left eye: Secondary | ICD-10-CM | POA: Diagnosis not present

## 2019-03-14 DIAGNOSIS — H35412 Lattice degeneration of retina, left eye: Secondary | ICD-10-CM | POA: Diagnosis not present

## 2019-03-14 NOTE — Progress Notes (Signed)
Office: [phone]  /  Fax: [phone] TeleHealth Visit:  [name] has verbally consented to this TeleHealth visit today. The patient is located at home, the provider is located at the [facility] office. The participants in this visit include the listed provider and patient and any and all parties involved. The visit was conducted today via FaceTime.  HPI:   Chief Complaint: OBESITY [name] is here to discuss her progress with her obesity treatment plan. She is on the Category 2 plan and is following her eating plan approximately 70 to 75 % of the time. She states she is exercising 0 minutes 0 times per week. [name] feels she has maintained her weight (weight not reported). She has not been able to follow her plan closely and she would like to look at other eating plans. We were unable to weigh the patient today for this TeleHealth visit. She feels as if she has maintained weight since her last visit. She has lost 11 lbs since starting treatment with [place].  Pre-Diabetes [name] has a diagnosis of prediabetes based on her elevated Hgb A1c and was informed this puts her at greater risk of developing diabetes. Her last A1c was at 5.7 She is stable on low dose metformin. [name] is working on diet and exercise. She continues to work on diet and exercise to decrease the risk of diabetes. She denies nausea or hypoglycemia.  Vitamin D deficiency [name] has a diagnosis of vitamin D deficiency. She is currently taking vit D and her vitamin D level is slowly improving, but she is not yet at goal. She denies nausea, vomiting or muscle weakness.  Depression with emotional eating behaviors [name] is struggling with emotional eating and using food for comfort to the extent that it is negatively impacting her health. She often snacks when she is not hungry. [name] sometimes feels she is out of control and then feels guilty that she made poor food choices. Patient states her mood is  stable on wellbutrin. She has no worsening insomnia, and she denies palpitations. [name] is not sure how much it is helping with her emotional eating. She has been working on behavior modification techniques to help reduce her emotional eating and has been somewhat successful. She shows no sign of suicidal or homicidal ideations.  Bilateral Lower Extremity Edema [name] has a diagnosis of lower extremity edema with diastolic dysfunction. She has had worsening edema in her feet bilaterally for approximately one week with no improvement with elevation. She has been eating out more and she is likely increasing the sodium in her diet. [name] denies chest pain, shortness of breath at rest or orthopnea.  ASSESSMENT AND PLAN:  Prediabetes - Plan: metFORMIN (GLUCOPHAGE) 500 MG tablet  Other depression - with emotional eating - Plan: buPROPion (WELLBUTRIN SR) 150 MG 12 hr tablet  Vitamin D deficiency - Plan: Vitamin D, Ergocalciferol, (DRISDOL) 1.25 MG (50000 UT) CAPS capsule  Edema of both legs  Class 3 severe obesity with serious comorbidity and body mass index (BMI) of 50.0 to 59.9 in adult, unspecified obesity type [facility])  PLAN:  Pre-Diabetes [name] will continue to work on weight loss, exercise, and decreasing simple carbohydrates in her diet to help decrease the risk of diabetes. We dicussed metformin including benefits and risks. She was informed that eating too many simple carbohydrates or too many calories at one sitting increases the likelihood of GI side effects. [name] agreed to continue metformin 500 mg two times daily with a meal #  60 with no refills and follow up with [place] as directed to monitor her progress.  Vitamin D Deficiency [name] was informed that low vitamin D levels contributes to fatigue and are associated with obesity, breast, and colon cancer. [name] agrees to continue to take prescription Vit D @50 ,000 IU every week #4 with no refills and she will follow up for routine testing  of vitamin D, at least 2-3 times per year. She was informed of the risk of over-replacement of vitamin D and agrees to not increase her dose unless she discusses this with [place] first. [name] agrees to follow up with our clinic in 3 weeks.  Depression with Emotional Eating Behaviors We discussed behavior modification techniques today to help [name] deal with her emotional eating and depression. She has agreed to continue Wellbutrin SR 150 mg daily #30 with no refills and follow up as directed.  Bilateral Lower Extremity Edema [name] agreed to contact her PCP today to see if she needs to be seen for an evaluation. She will work on decreasing sodium intake in her diet in the meanwhile. [name] agrees to follow up with our clinic in 3 weeks.  Obesity [name] is currently in the action stage of change. As such, her goal is to continue with weight loss efforts She has agreed to change to the lower carbohydrate, vegetable and lean protein rich diet plan [name] has been instructed to work up to a goal of 150 minutes of combined cardio and strengthening exercise per week for weight loss and overall health benefits. We discussed the following Behavioral Modification Strategies today: decreasing sodium intake, decrease eating out and emotional eating strategies  [name] has agreed to follow up with our clinic in 3 weeks. She was informed of the importance of frequent follow up visits to maximize her success with intensive lifestyle modifications for her multiple health conditions.  ALLERGIES: Allergies  Allergen Reactions  . Ketorolac Tromethamine Shortness Of Breath and Palpitations  . Atrovent [Ipratropium] Hives and Rash  . Latex Other (See Comments)    Powdered. Confirm type of reaction with patient.    MEDICATIONS: Current Outpatient Medications on File Prior to Visit  Medication Sig Dispense Refill  . albuterol (PROVENTIL HFA;VENTOLIN HFA) 108 (90 Base) MCG/ACT inhaler Inhale 1-2 puffs into the  lungs every 6 (six) hours as needed for wheezing or shortness of breath. 3 Inhaler 4  . amLODipine (NORVASC) 10 MG tablet Take 1 tablet (10 mg total) by mouth daily. 90 tablet 3  . budesonide-formoterol (SYMBICORT) 160-4.5 MCG/ACT inhaler Inhale 2 puffs into the lungs 2 (two) times daily. 3 Inhaler 4  . diclofenac (VOLTAREN) 75 MG EC tablet Take 1 tablet (75 mg total) by mouth 2 (two) times daily as needed. 40 tablet 1  . DULoxetine (CYMBALTA) 60 MG capsule Take 1 capsule (60 mg total) by mouth daily. 90 capsule 1  . hydrochlorothiazide (HYDRODIURIL) 25 MG tablet Take 1 tablet (25 mg total) by mouth daily. 90 tablet 1  . levocetirizine (XYZAL) 5 MG tablet Take 1 tablet (5 mg total) by mouth daily as needed for allergies.    [name] lisinopril (ZESTRIL) 40 MG tablet Take 1 tablet (40 mg total) by mouth daily. 90 tablet 1  . montelukast (SINGULAIR) 10 MG tablet Take 1 tablet (10 mg total) by mouth at bedtime. (Patient taking differently: Take 10 mg by mouth daily as needed (Asthma). ) 90 tablet 1  . pantoprazole (PROTONIX) 40 MG tablet Take 1 tablet (40 mg total) by mouth daily as  needed (for acid reflux).    [name]. spironolactone (ALDACTONE) 25 MG tablet Take 1 tablet (25 mg total) by mouth daily. 90 tablet 1  . tiZANidine (ZANAFLEX) 2 MG tablet Take 1 tablet (2 mg total) by mouth every 8 (eight) hours as needed for muscle spasms. 45 tablet 1  . traMADol (ULTRAM) 50 MG tablet Take 50 mg by mouth 2 (two) times daily as needed. for pain    . zolpidem (AMBIEN) 5 MG tablet 1 at bedtime if needed for sleep 15 tablet 1  . Zoster Vaccine Adjuvanted [facility] [address](SHINGRIX) injection 0.5 ml in muscle and repeat in 8 weeks 0.5 mL 1   No current facility-administered medications on file prior to visit.     PAST MEDICAL HISTORY: Past Medical History:  Diagnosis Date  . Abnormal EKG 06/2003   History of inverted T waves V1-V3. Normal 2D echo (07/23/2003): LVEF 65%.  . Anemia    BL Hgb 11-12. Ferritin 14 - low normal (08/2007).  Colonoscopy 2009 - external hemorrhoids (excellent prep). Last anemia panel (12/2010) - Iron  24, TIBC 269,  B12  316, Folate 11.9, Ferritin 73.  . Asthma   . B12 deficiency   . Back pain   . Chest pain   . Degenerative joint disease    BL knees (L>R), lumbar spine. Followed by Sports Medicine, Dr. Jennette KettleNeal.  . Dyspnea   . History of multiple pulmonary nodules    Incidental finding: CT Abd/ Pelvis (04/2010) - Several small lower lobe lung nodules, including one pure ground-glass pulmonary nodule measuring 8 mm in the left lower lobe.  Recommend follow-up chest CT (IV contrast preferred) in 6 months to document stability. //  CT Abd/ Pelvis (07/2010) -  3 mm RLL and  8 mm LLL nodule stable.  Other nodules unchanged, likely benign.  . Hyperlipidemia   . Hypertension   . Incarcerated ventral hernia 04/2010   Noted on CT Abd/ pelvis (04/2010). Patient now s/p ventral hernia repair by Dr. Gerrit FriendsGerkin (12/2010)  . Insomnia   . 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 Left hip pain   . Left ventricular diastolic dysfunction   . Leg edema   . Lower extremity edema    Chronic. 2D echo (2005) - EF 65%.  . Obesity   . OSA (obstructive sleep apnea)   . Palpitations   . Positive D dimer   . Right knee pain     PAST SURGICAL HISTORY: Past Surgical History:  Procedure Laterality Date  . COLONOSCOPY  2009  . COLONOSCOPY WITH PROPOFOL N/A 12/18/2017   Procedure: COLONOSCOPY WITH PROPOFOL;  Surgeon: Iva BoopGessner, Carl E, MD;  Location: [facility];  Service: Endoscopy;  Laterality: N/A;  . FOOT SURGERY  2004    left  . INCISION AND DRAINAGE ABSCESS ANAL  07/2008   I&D and debridgement of anorectal abscess  . INCISIONAL HERNIA REPAIR  12/2010   Repair of incarcerated ventral incisional hernia with Ethicon mesh patch - performed by Dr. Gerrit FriendsGerkin.   . INGUINAL HERNIA REPAIR    . JOINT REPLACEMENT Left 2013   left knee  . TOTAL KNEE ARTHROPLASTY  06/16/2011   Left TOTAL KNEE  ARTHROPLASTY;  Surgeon: Kennieth RadArthur F Carter;  Location: [facility] OR;  Service: Orthopedics;  Laterality: Left;  left total knee arthroplasty  . TUBAL LIGATION      SOCIAL HISTORY: Social History   Tobacco Use  . Smoking status: Former Smoker    Packs/day: 0.50  Years: 20.00    Pack years: 10.00    Types: Cigarettes    Quit date: 09/10/1998    Years since quitting: 20.5  . Smokeless tobacco: Never Used  . Tobacco comment: 63 yo   Substance Use Topics  . Alcohol use: No    Alcohol/week: 0.0 standard drinks  . Drug use: No    FAMILY HISTORY: Family History  Problem Relation Age of Onset  . Diabetes Mother   . Hypertension Mother   . Stroke Mother   . Hyperlipidemia Mother   . Heart disease Mother   . Sudden death Mother   . Kidney disease Mother   . Depression Mother   . Dementia Father   . Heart disease Father   . Hyperlipidemia Father   . Sudden death Father   . Depression Father   . Hypertension Sister   . Diabetes Brother   . Hypertension Brother   . Rashes / Skin problems Maternal Grandfather   . Heart attack Neg Hx     ROS: Review of Systems  Constitutional: Negative for weight loss.  Cardiovascular: Negative for chest pain, palpitations and orthopnea.       Negative for shortness of breath at rest  Gastrointestinal: Negative for nausea and vomiting.  Musculoskeletal:       Negative for muscle weakness Positive for Edema (bilateral lower extremities)  Endo/Heme/Allergies:       Negative for hypoglycemia  Psychiatric/Behavioral: Positive for depression. Negative for suicidal ideas. The patient does not have insomnia.     PHYSICAL EXAM: Pt in no acute distress  RECENT LABS AND TESTS: BMET    Component Value Date/Time   NA 143 08/16/2018 1207   K 4.0 08/16/2018 1207   CL 100 08/16/2018 1207   CO2 23 08/16/2018 1207   GLUCOSE 99 08/16/2018 1207   GLUCOSE 92 05/02/2018 1124   BUN 6 (L) 08/16/2018 1207   CREATININE 0.78 08/16/2018 1207   CREATININE 0.74  09/15/2017 1519   CALCIUM 9.8 08/16/2018 1207   GFRNONAA 82 08/16/2018 1207   GFRNONAA 81 03/21/2013 1530   GFRAA 95 08/16/2018 1207   GFRAA >89 03/21/2013 1530   Lab Results  Component Value Date   HGBA1C 5.7 (H) 08/16/2018   HGBA1C 5.7 05/02/2018   HGBA1C 5.3 10/25/2017   HGBA1C 5.6 12/02/2016   HGBA1C 5.7 (H) 04/02/2016   Lab Results  Component Value Date   INSULIN 10.7 08/16/2018   INSULIN 12.2 05/09/2018   CBC    Component Value Date/Time   WBC 5.1 05/09/2018 1007   WBC 5.3 11/07/2017 1635   RBC 4.38 05/09/2018 1007   RBC 4.16 11/07/2017 1635   HGB 11.9 05/09/2018 1007   HCT 38.1 05/09/2018 1007   PLT 363.0 11/07/2017 1635   MCV 87 05/09/2018 1007   MCH 27.2 05/09/2018 1007   MCH 27.7 10/25/2017 0207   MCHC 31.2 (L) 05/09/2018 1007   MCHC 32.4 11/07/2017 1635   RDW 13.6 05/09/2018 1007   LYMPHSABS 1.7 05/09/2018 1007   MONOABS 0.4 11/07/2017 1635   EOSABS 0.3 05/09/2018 1007   BASOSABS 0.1 05/09/2018 1007   Iron/TIBC/Ferritin/ %Sat    Component Value Date/Time   IRON 47 10/25/2017 0859   TIBC 330 10/25/2017 0859   FERRITIN 15 10/25/2017 0859   IRONPCTSAT 14 10/25/2017 0859   IRONPCTSAT 17 (L) 11/17/2011 1656   Lipid Panel     Component Value Date/Time   CHOL 195 08/16/2018 1207   TRIG 85 08/16/2018 1207  HDL 45 08/16/2018 1207   CHOLHDL 4.6 10/25/2017 1208   VLDL 10 10/25/2017 1208   LDLCALC 133 (H) 08/16/2018 1207   Hepatic Function Panel     Component Value Date/Time   PROT 6.9 08/16/2018 1207   ALBUMIN 4.0 08/16/2018 1207   AST 11 08/16/2018 1207   ALT 10 08/16/2018 1207   ALKPHOS 117 08/16/2018 1207   BILITOT 0.3 08/16/2018 1207   BILIDIR 0.1 05/03/2010 0625   IBILI 0.3 05/03/2010 0625      Component Value Date/Time   TSH 1.900 05/09/2018 1007   TSH 3.440 10/25/2017 0859   TSH 1.307 07/16/2014 1202   TSH 1.821 11/02/2012 1235     Ref. Range 08/16/2018 12:07  Vitamin D, 25-Hydroxy Latest Ref Range: 30.0 - 100.0 ng/mL 22.2 (L)     I, [place] CraneJoanne Murray, am acting as transcriptionist for Quillian Quincearen Aaralynn Shepheard, MD I have reviewed the above documentation for accuracy and completeness, and I agree with the above. -Quillian Quincearen Taliya Mcclard, MD

## 2019-03-15 ENCOUNTER — Encounter: Payer: Self-pay | Admitting: Family Medicine

## 2019-03-16 DIAGNOSIS — M79609 Pain in unspecified limb: Secondary | ICD-10-CM | POA: Diagnosis not present

## 2019-03-16 DIAGNOSIS — G4733 Obstructive sleep apnea (adult) (pediatric): Secondary | ICD-10-CM | POA: Diagnosis not present

## 2019-03-16 DIAGNOSIS — I10 Essential (primary) hypertension: Secondary | ICD-10-CM | POA: Diagnosis not present

## 2019-03-16 DIAGNOSIS — M519 Unspecified thoracic, thoracolumbar and lumbosacral intervertebral disc disorder: Secondary | ICD-10-CM | POA: Diagnosis not present

## 2019-03-27 ENCOUNTER — Encounter (INDEPENDENT_AMBULATORY_CARE_PROVIDER_SITE_OTHER): Payer: Self-pay | Admitting: Family Medicine

## 2019-03-27 ENCOUNTER — Other Ambulatory Visit: Payer: Self-pay

## 2019-03-27 ENCOUNTER — Telehealth (INDEPENDENT_AMBULATORY_CARE_PROVIDER_SITE_OTHER): Payer: Medicare Other | Admitting: Family Medicine

## 2019-03-27 DIAGNOSIS — F3289 Other specified depressive episodes: Secondary | ICD-10-CM | POA: Diagnosis not present

## 2019-03-27 DIAGNOSIS — Z6841 Body Mass Index (BMI) 40.0 and over, adult: Secondary | ICD-10-CM

## 2019-03-28 DIAGNOSIS — M5416 Radiculopathy, lumbar region: Secondary | ICD-10-CM | POA: Diagnosis not present

## 2019-03-28 DIAGNOSIS — M1612 Unilateral primary osteoarthritis, left hip: Secondary | ICD-10-CM | POA: Diagnosis not present

## 2019-03-28 DIAGNOSIS — G894 Chronic pain syndrome: Secondary | ICD-10-CM | POA: Diagnosis not present

## 2019-03-28 DIAGNOSIS — M47816 Spondylosis without myelopathy or radiculopathy, lumbar region: Secondary | ICD-10-CM | POA: Diagnosis not present

## 2019-04-01 NOTE — Progress Notes (Signed)
Office: [phone]  /  Fax: [phone] TeleHealth Visit:  [name] has verbally consented to this TeleHealth visit today. The patient is located at home, the provider is located at the [facility] office. The participants in this visit include the listed provider and patient. [name] was unable to use realtime audiovisual technology today and the telehealth visit was conducted via telephone.   HPI:   Chief Complaint: OBESITY [name] is here to discuss her progress with her obesity treatment plan. She is on the lower carbohydrate, vegetable and lean protein rich diet plan and is following her eating plan approximately 75 % of the time. She states she is trying to be more active. [name] changed to a lower carbohydrate eating plan, and thinks she is doing better with her eating. She has not weighed herself, but she feels less bloated. She would like to continue this plan for a while.  We were unable to weigh the patient today for this TeleHealth visit. She feels as if she has gained weight since her last visit. She has lost 11 lbs since starting treatment with us.  Depression with Emotional Eating Behaviors [name] is still struggling with chronic pain in her back and legs, which has affected her mood. She is hopeful that the exercise she is doing will help. She still struggles with emotional eating, but feels she is doing somewhat better with comfort eating. She often snacks when she is not hungry. [name] sometimes feels she is out of control and then feels guilty that she made poor food choices. She has been working on behavior modification techniques to help reduce her emotional eating and has been somewhat successful. She shows no sign of suicidal or homicidal ideations.  [facility] 10/05/2018 05/09/2018 09/29/2017 09/27/2017 08/18/2016  Decreased Interest 1 3 3 2  0  Down, Depressed, Hopeless 1 1 1  0 0  PHQ - 2 Score 2 4 4 2  0  Altered sleeping 2 2 2  0 -   Tired, decreased energy 2 3 2 2  -  Change in appetite 2 2 3 1  -  Feeling bad or failure about yourself  0 0 1 0 -  Trouble concentrating 0 1 2 0 -  Moving slowly or fidgety/restless 0 0 1 0 -  Suicidal thoughts 0 0 0 0 -  PHQ-9 Score 8 12 15 5  -  Difficult doing work/chores - Somewhat difficult Very difficult Very difficult -  Some recent data might be hidden    ASSESSMENT AND PLAN:  No diagnosis found.  PLAN:  Depression with Emotional Eating Behaviors We discussed behavior modification techniques today to help [name] deal with her emotional eating and depression. [name] agrees to continue taking Wellbutrin as is, and will continue to monitor. [name] agrees to follow up with our clinic in 2 to 3 weeks.  I spent > than 50% of the 25 minute visit on counseling as documented in the note.  Obesity [name] is currently in the action stage of change. As such, her goal is to continue with weight loss efforts She has agreed to follow a lower carbohydrate, vegetable and lean protein rich diet plan [name] has been instructed to work up to a goal of 150 minutes of combined cardio and strengthening exercise per week for weight loss and overall health benefits. We discussed the following Behavioral Modification Strategies today: increasing vegetables, increase H20 intake, and decreasing sodium intake   [name] has agreed to follow up with our clinic in 2  to 3 weeks. She was informed of the importance of frequent follow up visits to maximize her success with intensive lifestyle modifications for her multiple health conditions.  ALLERGIES: Allergies  Allergen Reactions  . Ketorolac Tromethamine Shortness Of Breath and Palpitations  . Atrovent [Ipratropium] Hives and Rash  . Latex Other (See Comments)    Powdered. Confirm type of reaction with patient.    MEDICATIONS: Current Outpatient Medications on File Prior to Visit  Medication Sig Dispense Refill  . albuterol (PROVENTIL HFA;VENTOLIN  HFA) 108 (90 Base) MCG/ACT inhaler Inhale 1-2 puffs into the lungs every 6 (six) hours as needed for wheezing or shortness of breath. 3 Inhaler 4  . amLODipine (NORVASC) 10 MG tablet Take 1 tablet (10 mg total) by mouth daily. 90 tablet 3  . budesonide-formoterol (SYMBICORT) 160-4.5 MCG/ACT inhaler Inhale 2 puffs into the lungs 2 (two) times daily. 3 Inhaler 4  . buPROPion (WELLBUTRIN SR) 150 MG 12 hr tablet Take 1 tablet (150 mg total) by mouth daily. 30 tablet 0  . diclofenac (VOLTAREN) 75 MG EC tablet Take 1 tablet (75 mg total) by mouth 2 (two) times daily as needed. 40 tablet 1  . DULoxetine (CYMBALTA) 60 MG capsule Take 1 capsule (60 mg total) by mouth daily. 90 capsule 1  . hydrochlorothiazide (HYDRODIURIL) 25 MG tablet Take 1 tablet (25 mg total) by mouth daily. 90 tablet 1  . levocetirizine (XYZAL) 5 MG tablet Take 1 tablet (5 mg total) by mouth daily as needed for allergies.    [name] lisinopril (ZESTRIL) 40 MG tablet Take 1 tablet (40 mg total) by mouth daily. 90 tablet 1  . metFORMIN (GLUCOPHAGE) 500 MG tablet Take 1 tablet (500 mg total) by mouth 2 (two) times daily with a meal. 60 tablet 0  . montelukast (SINGULAIR) 10 MG tablet Take 1 tablet (10 mg total) by mouth at bedtime. (Patient taking differently: Take 10 mg by mouth daily as needed (Asthma). ) 90 tablet 1  . pantoprazole (PROTONIX) 40 MG tablet Take 1 tablet (40 mg total) by mouth daily as needed (for acid reflux).    [name] spironolactone (ALDACTONE) 25 MG tablet Take 1 tablet (25 mg total) by mouth daily. 90 tablet 1  . tiZANidine (ZANAFLEX) 2 MG tablet Take 1 tablet (2 mg total) by mouth every 8 (eight) hours as needed for muscle spasms. 45 tablet 1  . traMADol (ULTRAM) 50 MG tablet Take 50 mg by mouth 2 (two) times daily as needed. for pain    . Vitamin D, Ergocalciferol, (DRISDOL) 1.25 MG (50000 UT) CAPS capsule Take 1 capsule (50,000 Units total) by mouth every 7 (seven) days. 4 capsule 0  . zolpidem (AMBIEN) 5 MG tablet 1 at  bedtime if needed for sleep 15 tablet 1  . Zoster Vaccine Adjuvanted [facility]) injection 0.5 ml in muscle and repeat in 8 weeks 0.5 mL 1   No current facility-administered medications on file prior to visit.     PAST MEDICAL HISTORY: Past Medical History:  Diagnosis Date  . Abnormal EKG 06/2003   History of inverted T waves V1-V3. Normal 2D echo (07/23/2003): LVEF 65%.  . Anemia    BL Hgb 11-12. Ferritin 14 - low normal (08/2007). Colonoscopy 2009 - external hemorrhoids (excellent prep). Last anemia panel (12/2010) - Iron  24, TIBC 269,  B12  316, Folate 11.9, Ferritin 73.  . Asthma   . B12 deficiency   . Back pain   . Chest pain   . Degenerative joint disease  BL knees (L>R), lumbar spine. Followed by Sports Medicine, Dr. Jennette KettleNeal.  . Dyspnea   . History of multiple pulmonary nodules    Incidental finding: CT Abd/ Pelvis (04/2010) - Several small lower lobe lung nodules, including one pure ground-glass pulmonary nodule measuring 8 mm in the left lower lobe.  Recommend follow-up chest CT (IV contrast preferred) in 6 months to document stability. //  CT Abd/ Pelvis (07/2010) -  3 mm RLL and  8 mm LLL nodule stable.  Other nodules unchanged, likely benign.  . Hyperlipidemia   . Hypertension   . Incarcerated ventral hernia 04/2010   Noted on CT Abd/ pelvis (04/2010). Patient now s/p ventral hernia repair by Dr. Gerrit FriendsGerkin (12/2010)  . Insomnia   . 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 Left hip pain   . Left ventricular diastolic dysfunction   . Leg edema   . Lower extremity edema    Chronic. 2D echo (2005) - EF 65%.  . Obesity   . OSA (obstructive sleep apnea)   . Palpitations   . Positive D dimer   . Right knee pain     PAST SURGICAL HISTORY: Past Surgical History:  Procedure Laterality Date  . COLONOSCOPY  2009  . COLONOSCOPY WITH PROPOFOL N/A 12/18/2017   Procedure: COLONOSCOPY WITH PROPOFOL;  Surgeon: Iva BoopGessner, Carl E, MD;  Location: [facility];   Service: Endoscopy;  Laterality: N/A;  . FOOT SURGERY  2004    left  . INCISION AND DRAINAGE ABSCESS ANAL  07/2008   I&D and debridgement of anorectal abscess  . INCISIONAL HERNIA REPAIR  12/2010   Repair of incarcerated ventral incisional hernia with Ethicon mesh patch - performed by Dr. Gerrit FriendsGerkin.   . INGUINAL HERNIA REPAIR    . JOINT REPLACEMENT Left 2013   left knee  . TOTAL KNEE ARTHROPLASTY  06/16/2011   Left TOTAL KNEE ARTHROPLASTY;  Surgeon: Kennieth RadArthur F Carter;  Location: [facility] OR;  Service: Orthopedics;  Laterality: Left;  left total knee arthroplasty  . TUBAL LIGATION      SOCIAL HISTORY: Social History   Tobacco Use  . Smoking status: Former Smoker    Packs/day: 0.50    Years: 20.00    Pack years: 10.00    Types: Cigarettes    Quit date: 09/10/1998    Years since quitting: 20.5  . Smokeless tobacco: Never Used  . Tobacco comment: 63 yo   Substance Use Topics  . Alcohol use: No    Alcohol/week: 0.0 standard drinks  . Drug use: No    FAMILY HISTORY: Family History  Problem Relation Age of Onset  . Diabetes Mother   . Hypertension Mother   . Stroke Mother   . Hyperlipidemia Mother   . Heart disease Mother   . Sudden death Mother   . Kidney disease Mother   . Depression Mother   . Dementia Father   . Heart disease Father   . Hyperlipidemia Father   . Sudden death Father   . Depression Father   . Hypertension Sister   . Diabetes Brother   . Hypertension Brother   . Rashes / Skin problems Maternal Grandfather   . Heart attack Neg Hx     ROS: Review of Systems  Constitutional: Negative for weight loss.  Cardiovascular:       + Leg pain (both)  Musculoskeletal: Positive for back pain.  Psychiatric/Behavioral: Positive for depression. Negative for suicidal ideas.    PHYSICAL EXAM: Pt  in no acute distress  RECENT LABS AND TESTS: BMET    Component Value Date/Time   NA 143 08/16/2018 1207   K 4.0 08/16/2018 1207   CL 100 08/16/2018 1207   CO2 23  08/16/2018 1207   GLUCOSE 99 08/16/2018 1207   GLUCOSE 92 05/02/2018 1124   BUN 6 (L) 08/16/2018 1207   CREATININE 0.78 08/16/2018 1207   CREATININE 0.74 09/15/2017 1519   CALCIUM 9.8 08/16/2018 1207   GFRNONAA 82 08/16/2018 1207   GFRNONAA 81 03/21/2013 1530   GFRAA 95 08/16/2018 1207   GFRAA >89 03/21/2013 1530   Lab Results  Component Value Date   HGBA1C 5.7 (H) 08/16/2018   HGBA1C 5.7 05/02/2018   HGBA1C 5.3 10/25/2017   HGBA1C 5.6 12/02/2016   HGBA1C 5.7 (H) 04/02/2016   Lab Results  Component Value Date   INSULIN 10.7 08/16/2018   INSULIN 12.2 05/09/2018   CBC    Component Value Date/Time   WBC 5.1 05/09/2018 1007   WBC 5.3 11/07/2017 1635   RBC 4.38 05/09/2018 1007   RBC 4.16 11/07/2017 1635   HGB 11.9 05/09/2018 1007   HCT 38.1 05/09/2018 1007   PLT 363.0 11/07/2017 1635   MCV 87 05/09/2018 1007   MCH 27.2 05/09/2018 1007   MCH 27.7 10/25/2017 0207   MCHC 31.2 (L) 05/09/2018 1007   MCHC 32.4 11/07/2017 1635   RDW 13.6 05/09/2018 1007   LYMPHSABS 1.7 05/09/2018 1007   MONOABS 0.4 11/07/2017 1635   EOSABS 0.3 05/09/2018 1007   BASOSABS 0.1 05/09/2018 1007   Iron/TIBC/Ferritin/ %Sat    Component Value Date/Time   IRON 47 10/25/2017 0859   TIBC 330 10/25/2017 0859   FERRITIN 15 10/25/2017 0859   IRONPCTSAT 14 10/25/2017 0859   IRONPCTSAT 17 (L) 11/17/2011 1656   Lipid Panel     Component Value Date/Time   CHOL 195 08/16/2018 1207   TRIG 85 08/16/2018 1207   HDL 45 08/16/2018 1207   CHOLHDL 4.6 10/25/2017 1208   VLDL 10 10/25/2017 1208   LDLCALC 133 (H) 08/16/2018 1207   Hepatic Function Panel     Component Value Date/Time   PROT 6.9 08/16/2018 1207   ALBUMIN 4.0 08/16/2018 1207   AST 11 08/16/2018 1207   ALT 10 08/16/2018 1207   ALKPHOS 117 08/16/2018 1207   BILITOT 0.3 08/16/2018 1207   BILIDIR 0.1 05/03/2010 0625   IBILI 0.3 05/03/2010 0625      Component Value Date/Time   TSH 1.900 05/09/2018 1007   TSH 3.440 10/25/2017 0859    TSH 1.307 07/16/2014 1202   TSH 1.821 11/02/2012 1235      I, Trixie Dredge, am acting as Location manager for Dennard Nip, MD I have reviewed the above documentation for accuracy and completeness, and I agree with the above. -Dennard Nip, MD

## 2019-04-10 ENCOUNTER — Telehealth (INDEPENDENT_AMBULATORY_CARE_PROVIDER_SITE_OTHER): Payer: Medicare Other | Admitting: Family Medicine

## 2019-04-11 ENCOUNTER — Ambulatory Visit (INDEPENDENT_AMBULATORY_CARE_PROVIDER_SITE_OTHER): Payer: Medicare Other | Admitting: Family Medicine

## 2019-04-11 ENCOUNTER — Encounter (INDEPENDENT_AMBULATORY_CARE_PROVIDER_SITE_OTHER): Payer: Self-pay | Admitting: Family Medicine

## 2019-04-25 DIAGNOSIS — M5416 Radiculopathy, lumbar region: Secondary | ICD-10-CM | POA: Diagnosis not present

## 2019-04-25 DIAGNOSIS — M1612 Unilateral primary osteoarthritis, left hip: Secondary | ICD-10-CM | POA: Diagnosis not present

## 2019-04-25 DIAGNOSIS — Z79891 Long term (current) use of opiate analgesic: Secondary | ICD-10-CM | POA: Diagnosis not present

## 2019-04-25 DIAGNOSIS — G894 Chronic pain syndrome: Secondary | ICD-10-CM | POA: Diagnosis not present

## 2019-04-25 DIAGNOSIS — M47816 Spondylosis without myelopathy or radiculopathy, lumbar region: Secondary | ICD-10-CM | POA: Diagnosis not present

## 2019-04-29 ENCOUNTER — Other Ambulatory Visit: Payer: Self-pay

## 2019-04-29 NOTE — Patient Outreach (Signed)
[place] [facility]) Care Management  04/29/2019  Shaily Librizzi 01-14-1956 [id number]   Medication Adherence call to Mrs. Sammuel Cooper [facility] Identifiers Verify spoke with patient she is past due on Lisinopril 40 mg patient explain she takes 1 tablet daily,patient ask to call [facility].Mrs. Aden is showing past due under [place].   [place] Management Direct Dial [phone]  Fax [phone] [email or web address]

## 2019-06-17 DIAGNOSIS — G4733 Obstructive sleep apnea (adult) (pediatric): Secondary | ICD-10-CM | POA: Diagnosis not present

## 2019-06-17 DIAGNOSIS — I10 Essential (primary) hypertension: Secondary | ICD-10-CM | POA: Diagnosis not present

## 2019-07-23 ENCOUNTER — Other Ambulatory Visit: Payer: Self-pay

## 2019-07-23 ENCOUNTER — Encounter (HOSPITAL_COMMUNITY): Payer: Self-pay | Admitting: Emergency Medicine

## 2019-07-23 ENCOUNTER — Emergency Department (HOSPITAL_COMMUNITY)
Admission: EM | Admit: 2019-07-23 | Discharge: 2019-07-24 | Disposition: A | Discharge status: Home / Self Care | Payer: Medicare Other | Attending: Emergency Medicine | Admitting: Emergency Medicine

## 2019-07-23 DIAGNOSIS — J45909 Unspecified asthma, uncomplicated: Secondary | ICD-10-CM | POA: Diagnosis not present

## 2019-07-23 DIAGNOSIS — R0602 Shortness of breath: Secondary | ICD-10-CM | POA: Diagnosis present

## 2019-07-23 DIAGNOSIS — R05 Cough: Secondary | ICD-10-CM | POA: Diagnosis not present

## 2019-07-23 DIAGNOSIS — Z91040 Latex allergy status: Secondary | ICD-10-CM | POA: Insufficient documentation

## 2019-07-23 DIAGNOSIS — J209 Acute bronchitis, unspecified: Secondary | ICD-10-CM | POA: Insufficient documentation

## 2019-07-23 DIAGNOSIS — I10 Essential (primary) hypertension: Secondary | ICD-10-CM | POA: Diagnosis not present

## 2019-07-23 DIAGNOSIS — Z96652 Presence of left artificial knee joint: Secondary | ICD-10-CM | POA: Insufficient documentation

## 2019-07-23 DIAGNOSIS — Z87891 Personal history of nicotine dependence: Secondary | ICD-10-CM | POA: Insufficient documentation

## 2019-07-23 DIAGNOSIS — Z20822 Contact with and (suspected) exposure to covid-19: Secondary | ICD-10-CM

## 2019-07-23 NOTE — ED Triage Notes (Signed)
Patient reports productive cough with chest congestion and runny nose onset this week , denies fever or chills .

## 2019-07-24 ENCOUNTER — Emergency Department (HOSPITAL_COMMUNITY): Payer: Medicare Other

## 2019-07-24 DIAGNOSIS — Z20822 Contact with and (suspected) exposure to covid-19: Secondary | ICD-10-CM | POA: Diagnosis not present

## 2019-07-24 DIAGNOSIS — J209 Acute bronchitis, unspecified: Secondary | ICD-10-CM | POA: Diagnosis not present

## 2019-07-24 DIAGNOSIS — R05 Cough: Secondary | ICD-10-CM | POA: Diagnosis not present

## 2019-07-24 DIAGNOSIS — R0602 Shortness of breath: Secondary | ICD-10-CM | POA: Diagnosis not present

## 2019-07-24 LAB — RESPIRATORY PANEL BY RT PCR (FLU A&B, COVID)
Influenza A by PCR: NEGATIVE
Influenza B by PCR: NEGATIVE
SARS Coronavirus 2 by RT PCR: NEGATIVE

## 2019-07-24 LAB — POC SARS CORONAVIRUS 2 AG -  ED: SARS Coronavirus 2 Ag: NEGATIVE

## 2019-07-24 MED ORDER — DOXYCYCLINE HYCLATE 100 MG PO CAPS: 100.0000 mg | ORAL_CAPSULE | Freq: Two times a day (BID) | ORAL | 0 refills | Status: DC

## 2019-07-24 MED ORDER — ONDANSETRON 4 MG PO TBDP
4.0000 mg | ORAL_TABLET | Freq: Once | ORAL | Status: AC
  Administered 2019-07-24: 4 mg via ORAL
  Filled 2019-07-24: qty 1

## 2019-07-24 MED ORDER — DOXYCYCLINE HYCLATE 100 MG PO TABS
100.0000 mg | ORAL_TABLET | Freq: Once | ORAL | Status: AC
  Administered 2019-07-24: 100 mg via ORAL
  Filled 2019-07-24: qty 1

## 2019-07-24 MED ORDER — ALBUTEROL SULFATE HFA 108 (90 BASE) MCG/ACT IN AERS
4.0000 | INHALATION_SPRAY | Freq: Once | RESPIRATORY_TRACT | Status: AC
  Administered 2019-07-24: 09:00:00 4 via RESPIRATORY_TRACT
  Filled 2019-07-24: qty 6.7

## 2019-07-24 MED ORDER — PREDNISONE 10 MG (21) PO TBPK: ORAL_TABLET | ORAL | 0 refills | Status: DC

## 2019-07-24 MED ORDER — AEROCHAMBER PLUS FLO-VU LARGE MISC
1.0000 | Freq: Once | Status: AC
  Administered 2019-07-24: 1

## 2019-07-24 MED ORDER — DEXAMETHASONE SODIUM PHOSPHATE 10 MG/ML IJ SOLN
10.0000 mg | Freq: Once | INTRAMUSCULAR | Status: AC
  Administered 2019-07-24: 10 mg via INTRAMUSCULAR
  Filled 2019-07-24: qty 1

## 2019-07-24 NOTE — ED Notes (Signed)
Got patient on the monitor patient is resting with call bell in reach  ?

## 2019-07-24 NOTE — ED Provider Notes (Signed)
[facility] EMERGENCY DEPARTMENT Provider Note   CSN: [id number] Arrival date & time: 07/23/19  2308     History Chief Complaint  Patient presents with  . Cough/Chest Congestion/Runny Nose    [name] is a 64 y.o. female.  Pt presents to the ED today with sob and cough.  The pt has no known covid exposures.  Pt has asthma and has been using her inhaler.  Pt denies f/c.  She has not had fever.  No cp.  No calf pain.  Pt has also had some nausea.          Past Medical History:  Diagnosis Date  . Abnormal EKG 06/2003   History of inverted T waves V1-V3. Normal 2D echo (07/23/2003): LVEF 65%.  . Anemia    BL Hgb 11-12. Ferritin 14 - low normal (08/2007). Colonoscopy 2009 - external hemorrhoids (excellent prep). Last anemia panel (12/2010) - Iron  24, TIBC 269,  B12  316, Folate 11.9, Ferritin 73.  . Asthma   . B12 deficiency   . Back pain   . Chest pain   . Degenerative joint disease    BL knees (L>R), lumbar spine. Followed by Sports Medicine, Dr. Jennette Kettle.  . Dyspnea   . History of multiple pulmonary nodules    Incidental finding: CT Abd/ Pelvis (04/2010) - Several small lower lobe lung nodules, including one pure ground-glass pulmonary nodule measuring 8 mm in the left lower lobe.  Recommend follow-up chest CT (IV contrast preferred) in 6 months to document stability. //  CT Abd/ Pelvis (07/2010) -  3 mm RLL and  8 mm LLL nodule stable.  Other nodules unchanged, likely benign.  . Hyperlipidemia   . Hypertension   . Incarcerated ventral hernia 04/2010   Noted on CT Abd/ pelvis (04/2010). Patient now s/p ventral hernia repair by Dr. Gerrit Friends (12/2010)  . Insomnia   . 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 Left hip pain   . Left ventricular diastolic dysfunction   . Leg edema   . Lower extremity edema    Chronic. 2D echo (2005) - EF 65%.  . Obesity   . OSA (obstructive sleep apnea)   . Palpitations   . Positive D  dimer   . Right knee pain     Patient Active Problem List   Diagnosis Date Noted  . Depression 12/11/2018  . Vitamin D deficiency 07/18/2018  . Prediabetes 07/18/2018  . Class 3 severe obesity with serious comorbidity and body mass index (BMI) of 50.0 to 59.9 in adult (HCC) 07/18/2018  . Insomnia 11/06/2017  . Chest pain 10/25/2017  . Morbid obesity with BMI of 50.0-59.9, adult (HCC) 10/25/2017  . Asthma in adult, mild intermittent, uncomplicated 10/25/2017  . Hypertension 10/25/2017  . Obstructive sleep apnea 10/25/2017  . Left ventricular diastolic dysfunction 10/25/2017  . Positive D dimer 10/25/2017  . Normochromic anemia 10/25/2017  . Low vitamin B12 level 10/25/2017  . Depression, major, single episode, moderate (HCC) 09/29/2017  . 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 B12 nutritional deficiency 09/27/2016  . Hyperlipidemia 04/02/2012  . Lower abdominal pain 12/30/2010  . Preventative health care 09/10/2010  . LUNG NODULE 05/10/2010  . Low back pain 01/20/2009  . Generalized osteoarthritis of multiple sites (left hip,knee,and lower back) 06/02/2008  . ANEMIA-NOS 08/15/2006  . Essential hypertension 08/11/2006  . Allergic rhinitis 08/11/2006    Past Surgical History:  Procedure Laterality  Date  . COLONOSCOPY  2009  . COLONOSCOPY WITH PROPOFOL N/A 12/18/2017   Procedure: COLONOSCOPY WITH PROPOFOL;  Surgeon: Iva Boop, MD;  Location: [facility];  Service: Endoscopy;  Laterality: N/A;  . FOOT SURGERY  2004    left  . INCISION AND DRAINAGE ABSCESS ANAL  07/2008   I&D and debridgement of anorectal abscess  . INCISIONAL HERNIA REPAIR  12/2010   Repair of incarcerated ventral incisional hernia with Ethicon mesh patch - performed by Dr. Gerrit Friends.   . INGUINAL HERNIA REPAIR    . JOINT REPLACEMENT Left 2013   left knee  . TOTAL KNEE ARTHROPLASTY  06/16/2011   Left TOTAL KNEE ARTHROPLASTY;  Surgeon: Kennieth Rad;  Location: [facility];  Service:  Orthopedics;  Laterality: Left;  left total knee arthroplasty  . TUBAL LIGATION       OB History    Gravida  3   Para      Term      Preterm      AB      Living  3     SAB      TAB      Ectopic      Multiple      Live Births              Family History  Problem Relation Age of Onset  . Diabetes Mother   . Hypertension Mother   . Stroke Mother   . Hyperlipidemia Mother   . Heart disease Mother   . Sudden death Mother   . Kidney disease Mother   . Depression Mother   . Dementia Father   . Heart disease Father   . Hyperlipidemia Father   . Sudden death Father   . Depression Father   . Hypertension Sister   . Diabetes Brother   . Hypertension Brother   . Rashes / Skin problems Maternal Grandfather   . Heart attack Neg Hx     Social History   Tobacco Use  . Smoking status: Former Smoker    Packs/day: 0.50    Years: 20.00    Pack years: 10.00    Types: Cigarettes    Quit date: 09/10/1998    Years since quitting: 20.8  . Smokeless tobacco: Never Used  . Tobacco comment: 64 yo   Substance Use Topics  . Alcohol use: No    Alcohol/week: 0.0 standard drinks  . Drug use: No    Home Medications Prior to Admission medications   Medication Sig Start Date End Date Taking? Authorizing Provider  albuterol (PROVENTIL HFA;VENTOLIN HFA) 108 (90 Base) MCG/ACT inhaler Inhale 1-2 puffs into the lungs every 6 (six) hours as needed for wheezing or shortness of breath. 02/14/18  Yes Young, Joni Fears D, MD  amLODipine (NORVASC) 10 MG tablet Take 1 tablet (10 mg total) by mouth daily. 01/09/19  Yes Beasley, Caren D, MD  budesonide-formoterol [facility]) 160-4.5 MCG/ACT inhaler Inhale 2 puffs into the lungs 2 (two) times daily. 02/14/18  Yes Jetty Duhamel D, MD  buPROPion (WELLBUTRIN SR) 150 MG 12 hr tablet Take 1 tablet (150 mg total) by mouth daily. 03/13/19  Yes Quillian Quince D, MD  diclofenac (VOLTAREN) 75 MG EC tablet Take 1 tablet (75 mg total) by mouth 2 (two) times  daily as needed. 01/02/19  Yes [name], [name], MD  DULoxetine (CYMBALTA) 60 MG capsule Take 1 capsule (60 mg total) by mouth daily. 09/07/18  Yes [name], [name], MD  hydrochlorothiazide (HYDRODIURIL) 25  MG tablet Take 1 tablet (25 mg total) by mouth daily. 01/02/19  Yes [name], [name], MD  levocetirizine (XYZAL) 5 MG tablet Take 1 tablet (5 mg total) by mouth daily as needed for allergies. 12/18/17  Yes Gatha Mayer, MD  lisinopril (ZESTRIL) 40 MG tablet Take 1 tablet (40 mg total) by mouth daily. 01/02/19  Yes [name], [name], MD  metFORMIN (GLUCOPHAGE) 500 MG tablet Take 1 tablet (500 mg total) by mouth 2 (two) times daily with a meal. 03/13/19  Yes Leafy Ro, Caren D, MD  pantoprazole (PROTONIX) 40 MG tablet Take 1 tablet (40 mg total) by mouth daily as needed (for acid reflux). 12/18/17  Yes Gatha Mayer, MD  spironolactone (ALDACTONE) 25 MG tablet Take 1 tablet (25 mg total) by mouth daily. 01/21/19  Yes [name], [name], MD  tiZANidine (ZANAFLEX) 2 MG tablet Take 1 tablet (2 mg total) by mouth every 8 (eight) hours as needed for muscle spasms. 09/29/17  Yes [name], [name], MD  traMADol (ULTRAM) 50 MG tablet Take 50 mg by mouth 2 (two) times daily as needed. for pain 11/22/18  Yes [provider]  zolpidem (AMBIEN) 5 MG tablet 1 at bedtime if needed for sleep 09/07/18  Yes [name], [name], MD  doxycycline (VIBRAMYCIN) 100 MG capsule Take 1 capsule (100 mg total) by mouth 2 (two) times daily. 07/24/19   Isla Pence, MD  predniSONE (STERAPRED UNI-PAK 21 TAB) 10 MG (21) TBPK tablet Take 6 tabs for 2 days, then 5 for 2 days, then 4 for 2 days, then 3 for 2 days, 2 for 2 days, then 1 for 2 days 07/24/19   Isla Pence, MD  Zoster Vaccine Adjuvanted [facility]) injection 0.5 ml in muscle and repeat in 8 weeks 08/08/18   [name], [name], MD    Allergies    Ketorolac tromethamine, Atrovent [ipratropium], and Latex  Review of Systems   Review of Systems  Respiratory: Positive for cough and  shortness of breath.     Physical Exam Updated Vital Signs BP 128/71 (BP Location: Left Arm)   Pulse 77   Temp 99.7 F (37.6 C) (Oral)   Resp 20   LMP 09/09/2009   SpO2 99%   Physical Exam Vitals and nursing note reviewed.  Constitutional:      Appearance: Normal appearance. She is obese.  HENT:     Head: Normocephalic and atraumatic.     Right Ear: External ear normal.     Left Ear: External ear normal.     Nose: Nose normal.     Mouth/Throat:     Mouth: Mucous membranes are moist.     Pharynx: Oropharynx is clear.  Eyes:     Extraocular Movements: Extraocular movements intact.     Conjunctiva/sclera: Conjunctivae normal.     Pupils: Pupils are equal, round, and reactive to light.  Cardiovascular:     Rate and Rhythm: Normal rate and regular rhythm.     Pulses: Normal pulses.     Heart sounds: Normal heart sounds.  Pulmonary:     Effort: Pulmonary effort is normal.     Breath sounds: Wheezing present.  Abdominal:     General: Abdomen is flat. Bowel sounds are normal.     Palpations: Abdomen is soft.  Musculoskeletal:        General: Normal range of motion.     Cervical back: Normal range of motion and neck supple.  Skin:    General: Skin is warm.  Capillary Refill: Capillary refill takes less than 2 seconds.  Neurological:     General: No focal deficit present.     Mental Status: She is alert and oriented to person, place, and time.  Psychiatric:        Mood and Affect: Mood normal.        Behavior: Behavior normal.        Thought Content: Thought content normal.        Judgment: Judgment normal.     ED Results / Procedures / Treatments   Labs (all labs ordered are listed, but only abnormal results are displayed) Labs Reviewed  RESPIRATORY PANEL BY RT PCR (FLU A&B, COVID)  POC SARS CORONAVIRUS 2 AG -  ED    EKG None  Radiology DG Chest Portable 1 View  Result Date: 07/24/2019 CLINICAL DATA:  Productive cough EXAM: PORTABLE CHEST 1 VIEW  COMPARISON:  October 25, 2017 FINDINGS: The heart size and mediastinal contours are within normal limits. Aortic knob calcifications. Shallow degree of aeration with probable subsegmental atelectasis at both lung bases. The visualized skeletal structures are unremarkable. IMPRESSION: Shallow degree of aeration with probable subsegmental atelectasis. Electronically Signed   By: Jonna ClarkBindu  Avutu M.D.   On: 07/24/2019 00:50    Procedures Procedures (including critical care time)  Medications Ordered in ED Medications  dexamethasone (DECADRON) injection 10 mg (10 mg Intramuscular Given 07/24/19 0913)  ondansetron (ZOFRAN-ODT) disintegrating tablet 4 mg (4 mg Oral Given 07/24/19 0913)  albuterol (VENTOLIN HFA) 108 (90 Base) MCG/ACT inhaler 4 puff (4 puffs Inhalation Given 07/24/19 0914)  AeroChamber Plus Flo-Vu Large MISC 1 each (1 each Other Given 07/24/19 0942)  doxycycline (VIBRA-TABS) tablet 100 mg (100 mg Oral Given 07/24/19 0913)    ED Course  I have reviewed the triage vital signs and the nursing notes.  Pertinent labs & imaging results that were available during my care of the patient were reviewed by me and considered in my medical decision making (see chart for details).    MDM Rules/Calculators/A&P                      Pt's Covid Ag swab negative, so the RVP was ordered.  Pt given albuterol and decadron for her asthma.    Covid PCR is pending.  Pt oxygenating well.  She is stable for d/c.  Return if worse.  She is told to isolate until her covid test comes back.  [name] was evaluated in Emergency Department on 07/24/2019 for the symptoms described in the history of present illness. She was evaluated in the context of the global COVID-19 pandemic, which necessitated consideration that the patient might be at risk for infection with the SARS-CoV-2 virus that causes COVID-19. Institutional protocols and algorithms that pertain to the evaluation of patients at risk for COVID-19 are in  a state of rapid change based on information released by regulatory bodies including the CDC and federal and state organizations. These policies and algorithms were followed during the patient's care in the ED.  Final Clinical Impression(s) / ED Diagnoses Final diagnoses:  Acute bronchitis, unspecified organism  Suspected COVID-19 virus infection    Rx / DC Orders ED Discharge Orders         Ordered    predniSONE (STERAPRED UNI-PAK 21 TAB) 10 MG (21) TBPK tablet     07/24/19 0952    doxycycline (VIBRAMYCIN) 100 MG capsule  2 times daily     07/24/19 [id number]  Jacalyn Lefevre, MD 07/24/19 [phone]

## 2019-07-31 ENCOUNTER — Encounter: Payer: Self-pay | Admitting: Family Medicine

## 2019-08-06 ENCOUNTER — Encounter: Payer: Self-pay | Admitting: Family Medicine

## 2019-08-06 ENCOUNTER — Telehealth (INDEPENDENT_AMBULATORY_CARE_PROVIDER_SITE_OTHER): Payer: Medicare Other | Admitting: Family Medicine

## 2019-08-06 VITALS — BP 140/60

## 2019-08-06 DIAGNOSIS — G8929 Other chronic pain: Secondary | ICD-10-CM | POA: Diagnosis not present

## 2019-08-06 DIAGNOSIS — M549 Dorsalgia, unspecified: Secondary | ICD-10-CM | POA: Diagnosis not present

## 2019-08-06 DIAGNOSIS — M5116 Intervertebral disc disorders with radiculopathy, lumbar region: Secondary | ICD-10-CM

## 2019-08-06 DIAGNOSIS — I10 Essential (primary) hypertension: Secondary | ICD-10-CM | POA: Diagnosis not present

## 2019-08-06 MED ORDER — GABAPENTIN 300 MG PO CAPS: 300.0000 mg | ORAL_CAPSULE | Freq: Every day | ORAL | 1 refills | Status: DC

## 2019-08-06 NOTE — Progress Notes (Signed)
Virtual Visit via Video Note   I connected with [name] on 08/06/19 by a video enabled telemedicine application and verified that I am speaking with the correct person using two identifiers.  Location patient: home Location provider:work office Persons participating in the virtual visit: patient, provider  I discussed the limitations of evaluation and management by telemedicine and the availability of in person appointments. The patient expressed understanding and agreed to proceed.   HPI: [name] is a 64 yo female with hx of back pain,knee OA,asthma,and HTN among some requesting referral to neurologist. C/O LE tingling and burning like sensation,back of legs, L>R., left left heavy sensation. This problem has been getting going on for "several months" but gradually getting worse and more frequent. "Really painful" in lower back and buttocks. Exacerbated by walking and standing after long sitting,alleviated with rest. Pain in mid back radiated to lumbar area. Sharp, 8-9/10.  "Little" numbness sensation in perineal area when wiping. No changes in urinary continence or bowel function.  Negative for abdominal pain, changes in bowel habits, dysuria, gross hematuria, or decreased urine output. Urine urgency with incontinence if she tries to hold her urine for long.  Lumbar MRI 02/21/2019: Progressive facet degeneration at L2-3 now with mild spinal stenosis. Mild disc and facet degeneration L3-4 L4-5 without stenosis. Progressive facet degeneration L5-S1. Progressive subarticular stenosis bilaterally.  She has established with pain management.  She is on Cymbalta for generalized OA, it is helping.  HTN: She is on Amlodipine 10 mg daily,HCTZ 25 mg daily,spironolactone 25 mg daily,Lisinopril 40 mg daily. Denies severe/frequent headache, visual changes, chest pain, dyspnea, palpitation, claudication, focal weakness, or worsening edema. BP's are "good." 130-140/60's.  Lab Results   Component Value Date   CREATININE 0.78 08/16/2018   BUN 6 (L) 08/16/2018   NA 143 08/16/2018   K 4.0 08/16/2018   CL 100 08/16/2018   CO2 23 08/16/2018    ROS: See pertinent positives and negatives per HPI.  Past Medical History:  Diagnosis Date  . Abnormal EKG 06/2003   History of inverted T waves V1-V3. Normal 2D echo (07/23/2003): LVEF 65%.  . Anemia    BL Hgb 11-12. Ferritin 14 - low normal (08/2007). Colonoscopy 2009 - external hemorrhoids (excellent prep). Last anemia panel (12/2010) - Iron  24, TIBC 269,  B12  316, Folate 11.9, Ferritin 73.  . Asthma   . B12 deficiency   . Back pain   . Chest pain   . Degenerative joint disease    BL knees (L>R), lumbar spine. Followed by Sports Medicine, Dr. Jennette Kettle.  . Dyspnea   . History of multiple pulmonary nodules    Incidental finding: CT Abd/ Pelvis (04/2010) - Several small lower lobe lung nodules, including one pure ground-glass pulmonary nodule measuring 8 mm in the left lower lobe.  Recommend follow-up chest CT (IV contrast preferred) in 6 months to document stability. //  CT Abd/ Pelvis (07/2010) -  3 mm RLL and  8 mm LLL nodule stable.  Other nodules unchanged, likely benign.  . Hyperlipidemia   . Hypertension   . Incarcerated ventral hernia 04/2010   Noted on CT Abd/ pelvis (04/2010). Patient now s/p ventral hernia repair by Dr. Gerrit Friends (12/2010)  . Insomnia   . 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 Left hip pain   . Left ventricular diastolic dysfunction   . Leg edema   . Lower extremity edema    Chronic. 2D echo (2005) -  EF 65%.  . Obesity   . OSA (obstructive sleep apnea)   . Palpitations   . Positive D dimer   . Right knee pain    Past Surgical History:  Procedure Laterality Date  . COLONOSCOPY  2009  . COLONOSCOPY WITH PROPOFOL N/A 12/18/2017   Procedure: COLONOSCOPY WITH PROPOFOL;  Surgeon: Gatha Mayer, MD;  Location: [facility];  Service: Endoscopy;  Laterality: N/A;  .  FOOT SURGERY  2004    left  . INCISION AND DRAINAGE ABSCESS ANAL  07/2008   I&D and debridgement of anorectal abscess  . INCISIONAL HERNIA REPAIR  12/2010   Repair of incarcerated ventral incisional hernia with Ethicon mesh patch - performed by Dr. Harlow Asa.   . INGUINAL HERNIA REPAIR    . JOINT REPLACEMENT Left 2013   left knee  . TOTAL KNEE ARTHROPLASTY  06/16/2011   Left TOTAL KNEE ARTHROPLASTY;  Surgeon: Sharmon Revere;  Location: [place];  Service: Orthopedics;  Laterality: Left;  left total knee arthroplasty  . TUBAL LIGATION      Family History  Problem Relation Age of Onset  . Diabetes Mother   . Hypertension Mother   . Stroke Mother   . Hyperlipidemia Mother   . Heart disease Mother   . Sudden death Mother   . Kidney disease Mother   . Depression Mother   . Dementia Father   . Heart disease Father   . Hyperlipidemia Father   . Sudden death Father   . Depression Father   . Hypertension Sister   . Diabetes Brother   . Hypertension Brother   . Rashes / Skin problems Maternal Grandfather   . Heart attack Neg Hx     Social History   Socioeconomic History  . Marital status: Married    Spouse name: August Saucer  . Number of children: 3  . Years of education: Not on file  . Highest education level: Not on file  Occupational History  . Occupation: Stay at home  Tobacco Use  . Smoking status: Former Smoker    Packs/day: 0.50    Years: 20.00    Pack years: 10.00    Types: Cigarettes    Quit date: 09/10/1998    Years since quitting: 20.9  . Smokeless tobacco: Never Used  . Tobacco comment: 64 yo   Substance and Sexual Activity  . Alcohol use: No    Alcohol/week: 0.0 standard drinks  . Drug use: No  . Sexual activity: Yes    Partners: Male  Other Topics Concern  . Not on file  Social History Narrative   10/05/2018: Lives with husband, daughter, and 2 grandchildren in [place] house. Lives on main level though mostly.   Has three children altogether, all local,  supportive   Currently getting ramp installed in house d/t pt difficulty getting upstairs.      Social Determinants of Health   Financial Resource Strain: Low Risk   . Difficulty of Paying Living Expenses: Not hard at all  Food Insecurity: No Food Insecurity  . Worried About Charity fundraiser in the Last Year: Never true  . Ran Out of Food in the Last Year: Never true  Transportation Needs: No Transportation Needs  . Lack of Transportation (Medical): No  . Lack of Transportation (Non-Medical): No  Physical Activity: Inactive  . Days of Exercise per Week: 0 days  . Minutes of Exercise per Session: 0 min  Stress: No Stress Concern Present  . Feeling of Stress :  Only a little  Social Connections: Unknown  . Frequency of Communication with Friends and Family: Three times a week  . Frequency of Social Gatherings with Friends and Family: Twice a week  . Attends Religious Services: Not on file  . Active Member of Clubs or Organizations: Not on file  . Attends Banker Meetings: Not on file  . Marital Status: Married  Catering manager Violence: Not At Risk  . Fear of Current or Ex-Partner: No  . Emotionally Abused: No  . Physically Abused: No  . Sexually Abused: No    Current Outpatient Medications:  .  albuterol (PROVENTIL HFA;VENTOLIN HFA) 108 (90 Base) MCG/ACT inhaler, Inhale 1-2 puffs into the lungs every 6 (six) hours as needed for wheezing or shortness of breath., Disp: 3 Inhaler, Rfl: 4 .  amLODipine (NORVASC) 10 MG tablet, Take 1 tablet (10 mg total) by mouth daily., Disp: 90 tablet, Rfl: 3 .  budesonide-formoterol (SYMBICORT) 160-4.5 MCG/ACT inhaler, Inhale 2 puffs into the lungs 2 (two) times daily., Disp: 3 Inhaler, Rfl: 4 .  buPROPion (WELLBUTRIN SR) 150 MG 12 hr tablet, Take 1 tablet (150 mg total) by mouth daily., Disp: 30 tablet, Rfl: 0 .  diclofenac (VOLTAREN) 75 MG EC tablet, Take 1 tablet (75 mg total) by mouth 2 (two) times daily as needed., Disp: 40  tablet, Rfl: 1 .  DULoxetine (CYMBALTA) 60 MG capsule, Take 1 capsule (60 mg total) by mouth daily., Disp: 90 capsule, Rfl: 1 .  hydrochlorothiazide (HYDRODIURIL) 25 MG tablet, Take 1 tablet (25 mg total) by mouth daily., Disp: 90 tablet, Rfl: 1 .  levocetirizine (XYZAL) 5 MG tablet, Take 1 tablet (5 mg total) by mouth daily as needed for allergies., Disp: , Rfl:  .  lisinopril (ZESTRIL) 40 MG tablet, Take 1 tablet (40 mg total) by mouth daily., Disp: 90 tablet, Rfl: 1 .  metFORMIN (GLUCOPHAGE) 500 MG tablet, Take 1 tablet (500 mg total) by mouth 2 (two) times daily with a meal., Disp: 60 tablet, Rfl: 0 .  pantoprazole (PROTONIX) 40 MG tablet, Take 1 tablet (40 mg total) by mouth daily as needed (for acid reflux)., Disp: , Rfl:  .  predniSONE (STERAPRED UNI-PAK 21 TAB) 10 MG (21) TBPK tablet, Take 6 tabs for 2 days, then 5 for 2 days, then 4 for 2 days, then 3 for 2 days, 2 for 2 days, then 1 for 2 days, Disp: 42 tablet, Rfl: 0 .  spironolactone (ALDACTONE) 25 MG tablet, Take 1 tablet (25 mg total) by mouth daily., Disp: 90 tablet, Rfl: 1 .  traMADol (ULTRAM) 50 MG tablet, Take 50 mg by mouth 2 (two) times daily as needed. for pain, Disp: , Rfl:  .  zolpidem (AMBIEN) 5 MG tablet, 1 at bedtime if needed for sleep, Disp: 15 tablet, Rfl: 1 .  Zoster Vaccine Adjuvanted [facility]) injection, 0.5 ml in muscle and repeat in 8 weeks, Disp: 0.5 mL, Rfl: 1 .  gabapentin (NEURONTIN) 300 MG capsule, Take 1 capsule (300 mg total) by mouth at bedtime., Disp: 30 capsule, Rfl: 1  EXAM:  VITALS per patient if applicable:BP 140/60   LMP 09/09/2009   GENERAL: alert, oriented, appears well and in no acute distress  HEENT: atraumatic, conjunctiva clear, no obvious abnormalities on inspection.  NECK: normal movements of the head and neck  LUNGS: on inspection no signs of respiratory distress, breathing rate appears normal, no obvious gross SOB, gasping or wheezing  CV: no obvious cyanosis  PSYCH/NEURO:  pleasant and  cooperative, no obvious depression or anxiety, speech and thought processing grossly intact  ASSESSMENT AND PLAN:  Discussed the following assessment and plan:  Radiculopathy due to lumbar intervertebral disc disorder - Plan: Ambulatory referral to Neurosurgery, gabapentin (NEURONTIN) 300 MG capsule I do not think neurology referral is needed at this time, LE symptoms seem to be related to lumbar DDD. She agrees with having neurosurgeon consultation. Continue Cymbalta 60 mg daily. Agrees with adding Gabapentin 300 mg at bedtime. Some side effects discussed. F/U in 4 weeks.  Essential hypertension BP goal at least < 140/90. Continue monitoring BP. No changes in current management.  Chronic bilateral back pain, unspecified back location Low thoracic and lumbar pain. Continue Cymbalta 60 mg daily. Wt loss will help. Following with pain management.  I discussed the assessment and treatment plan with the patient. Ms Dayrit was provided an opportunity to ask questions and all were answered. She agreed with the plan and demonstrated an understanding of the instructions.    Return in about 4 weeks (around 09/03/2019), or LE numbness and tingling..    Betty [name], MD

## 2019-08-08 ENCOUNTER — Encounter: Payer: Self-pay | Admitting: Family Medicine

## 2019-08-25 ENCOUNTER — Encounter: Payer: Self-pay | Admitting: Family Medicine

## 2019-08-27 ENCOUNTER — Other Ambulatory Visit: Payer: Self-pay

## 2019-08-28 ENCOUNTER — Ambulatory Visit: Payer: Medicare Other | Admitting: Family Medicine

## 2019-08-30 ENCOUNTER — Other Ambulatory Visit: Payer: Self-pay

## 2019-08-30 DIAGNOSIS — M5116 Intervertebral disc disorders with radiculopathy, lumbar region: Secondary | ICD-10-CM

## 2019-08-30 DIAGNOSIS — I10 Essential (primary) hypertension: Secondary | ICD-10-CM

## 2019-08-30 MED ORDER — LISINOPRIL 40 MG PO TABS: 40.0000 mg | ORAL_TABLET | Freq: Every day | ORAL | 1 refills | Status: DC

## 2019-08-30 MED ORDER — GABAPENTIN 300 MG PO CAPS: 300.0000 mg | ORAL_CAPSULE | Freq: Every day | ORAL | 1 refills | Status: DC

## 2019-09-06 DIAGNOSIS — M1612 Unilateral primary osteoarthritis, left hip: Secondary | ICD-10-CM | POA: Diagnosis not present

## 2019-09-06 DIAGNOSIS — M25552 Pain in left hip: Secondary | ICD-10-CM | POA: Diagnosis not present

## 2019-09-19 DIAGNOSIS — H35411 Lattice degeneration of retina, right eye: Secondary | ICD-10-CM | POA: Diagnosis not present

## 2019-09-19 DIAGNOSIS — I10 Essential (primary) hypertension: Secondary | ICD-10-CM | POA: Diagnosis not present

## 2019-09-19 DIAGNOSIS — H31091 Other chorioretinal scars, right eye: Secondary | ICD-10-CM | POA: Diagnosis not present

## 2019-09-19 DIAGNOSIS — G4733 Obstructive sleep apnea (adult) (pediatric): Secondary | ICD-10-CM | POA: Diagnosis not present

## 2019-09-19 DIAGNOSIS — H33321 Round hole, right eye: Secondary | ICD-10-CM | POA: Diagnosis not present

## 2019-09-19 DIAGNOSIS — H35371 Puckering of macula, right eye: Secondary | ICD-10-CM | POA: Diagnosis not present

## 2019-09-19 DIAGNOSIS — H35412 Lattice degeneration of retina, left eye: Secondary | ICD-10-CM | POA: Diagnosis not present

## 2019-09-19 DIAGNOSIS — H21342 Primary cyst of pars plana, left eye: Secondary | ICD-10-CM | POA: Diagnosis not present

## 2019-09-26 DIAGNOSIS — M1612 Unilateral primary osteoarthritis, left hip: Secondary | ICD-10-CM | POA: Diagnosis not present

## 2019-09-26 DIAGNOSIS — H25813 Combined forms of age-related cataract, bilateral: Secondary | ICD-10-CM | POA: Diagnosis not present

## 2019-09-26 DIAGNOSIS — H5213 Myopia, bilateral: Secondary | ICD-10-CM | POA: Diagnosis not present

## 2019-09-26 DIAGNOSIS — H04123 Dry eye syndrome of bilateral lacrimal glands: Secondary | ICD-10-CM | POA: Diagnosis not present

## 2019-09-26 DIAGNOSIS — M25552 Pain in left hip: Secondary | ICD-10-CM | POA: Diagnosis not present

## 2019-09-26 DIAGNOSIS — H0102B Squamous blepharitis left eye, upper and lower eyelids: Secondary | ICD-10-CM | POA: Diagnosis not present

## 2019-09-26 DIAGNOSIS — H0102A Squamous blepharitis right eye, upper and lower eyelids: Secondary | ICD-10-CM | POA: Diagnosis not present

## 2019-09-27 ENCOUNTER — Encounter: Payer: Self-pay | Admitting: Family Medicine

## 2019-09-27 DIAGNOSIS — M25552 Pain in left hip: Secondary | ICD-10-CM | POA: Diagnosis not present

## 2019-10-02 ENCOUNTER — Encounter: Payer: Self-pay | Admitting: Family Medicine

## 2019-10-04 ENCOUNTER — Other Ambulatory Visit: Payer: Self-pay

## 2019-10-04 DIAGNOSIS — M159 Polyosteoarthritis, unspecified: Secondary | ICD-10-CM

## 2019-10-10 ENCOUNTER — Other Ambulatory Visit: Payer: Self-pay | Admitting: Family Medicine

## 2019-10-10 DIAGNOSIS — M159 Polyosteoarthritis, unspecified: Secondary | ICD-10-CM

## 2019-10-25 ENCOUNTER — Encounter: Payer: Self-pay | Admitting: Family Medicine

## 2019-11-05 DIAGNOSIS — M1612 Unilateral primary osteoarthritis, left hip: Secondary | ICD-10-CM | POA: Diagnosis not present

## 2019-11-05 DIAGNOSIS — M5416 Radiculopathy, lumbar region: Secondary | ICD-10-CM | POA: Diagnosis not present

## 2019-11-05 DIAGNOSIS — G894 Chronic pain syndrome: Secondary | ICD-10-CM | POA: Diagnosis not present

## 2019-11-05 DIAGNOSIS — M47816 Spondylosis without myelopathy or radiculopathy, lumbar region: Secondary | ICD-10-CM | POA: Diagnosis not present

## 2019-11-12 ENCOUNTER — Other Ambulatory Visit: Payer: Self-pay | Admitting: Family Medicine

## 2019-11-12 DIAGNOSIS — M5116 Intervertebral disc disorders with radiculopathy, lumbar region: Secondary | ICD-10-CM

## 2019-11-26 ENCOUNTER — Ambulatory Visit: Payer: Medicare Other | Admitting: Family Medicine

## 2019-11-27 ENCOUNTER — Telehealth: Payer: Self-pay | Admitting: Family Medicine

## 2019-11-27 NOTE — Telephone Encounter (Signed)
Spoke with Misty Stanley and she stated that [facility] from [employer] stated that they have not received information on lift chair. Rx and notes faxed to [employer] [facility] at [phone]. Previous information was sent to [employer] on 10/04/2019 by Maralyn Sago, CMA

## 2019-11-27 NOTE — Telephone Encounter (Signed)
Left message for [name] to return call.

## 2019-11-27 NOTE — Telephone Encounter (Signed)
Misty Stanley with [facility] is calling in about the pt getting a lift chair from [employer] and need more information about it.  Misty Stanley stated that if she does not answer you can leave a detail msg [phone]  Ext: [phone].

## 2019-11-29 ENCOUNTER — Other Ambulatory Visit: Payer: Self-pay

## 2019-12-02 ENCOUNTER — Encounter: Payer: Self-pay | Admitting: Family Medicine

## 2019-12-02 ENCOUNTER — Other Ambulatory Visit: Payer: Self-pay

## 2019-12-02 ENCOUNTER — Ambulatory Visit (INDEPENDENT_AMBULATORY_CARE_PROVIDER_SITE_OTHER): Payer: Medicare Other | Admitting: Family Medicine

## 2019-12-02 VITALS — BP 122/82 | HR 79 | Temp 97.3°F | Resp 16 | Ht 65.0 in | Wt 382.2 lb

## 2019-12-02 DIAGNOSIS — I10 Essential (primary) hypertension: Secondary | ICD-10-CM

## 2019-12-02 DIAGNOSIS — M5116 Intervertebral disc disorders with radiculopathy, lumbar region: Secondary | ICD-10-CM

## 2019-12-02 DIAGNOSIS — E785 Hyperlipidemia, unspecified: Secondary | ICD-10-CM | POA: Diagnosis not present

## 2019-12-02 DIAGNOSIS — R7303 Prediabetes: Secondary | ICD-10-CM

## 2019-12-02 DIAGNOSIS — E538 Deficiency of other specified B group vitamins: Secondary | ICD-10-CM | POA: Diagnosis not present

## 2019-12-02 DIAGNOSIS — Z6841 Body Mass Index (BMI) 40.0 and over, adult: Secondary | ICD-10-CM

## 2019-12-02 DIAGNOSIS — E559 Vitamin D deficiency, unspecified: Secondary | ICD-10-CM | POA: Diagnosis not present

## 2019-12-02 LAB — COMPREHENSIVE METABOLIC PANEL
ALT: 6 U/L (ref 0–35)
AST: 11 U/L (ref 0–37)
Albumin: 3.8 g/dL (ref 3.5–5.2)
Alkaline Phosphatase: 105 U/L (ref 39–117)
BUN: 12 mg/dL (ref 6–23)
CO2: 26 mEq/L (ref 19–32)
Calcium: 9.4 mg/dL (ref 8.4–10.5)
Chloride: 108 mEq/L (ref 96–112)
Creatinine, Ser: 0.69 mg/dL (ref 0.40–1.20)
GFR: 104.08 mL/min (ref >60.00)
Glucose, Bld: 94 mg/dL (ref 70–99)
Potassium: 4 mEq/L (ref 3.5–5.1)
Sodium: 140 mEq/L (ref 135–145)
Total Bilirubin: 0.3 mg/dL (ref 0.2–1.2)
Total Protein: 6.4 g/dL (ref 6.0–8.3)

## 2019-12-02 LAB — LIPID PANEL
Cholesterol: 202 mg/dL — ABNORMAL HIGH (ref 0–200)
HDL: 39.4 mg/dL (ref >39.00)
LDL Cholesterol: 145 mg/dL — ABNORMAL HIGH (ref 0–99)
NonHDL: 162.79
Total CHOL/HDL Ratio: 5
Triglycerides: 87 mg/dL (ref 0.0–149.0)
VLDL: 17.4 mg/dL (ref 0.0–40.0)

## 2019-12-02 LAB — VITAMIN B12: Vitamin B-12: 97 pg/mL — ABNORMAL LOW (ref 211–911)

## 2019-12-02 LAB — VITAMIN D 25 HYDROXY (VIT D DEFICIENCY, FRACTURES): VITD: 12.29 ng/mL — ABNORMAL LOW (ref 30.00–100.00)

## 2019-12-02 LAB — HEMOGLOBIN A1C: Hgb A1c MFr Bld: 5.7 % (ref 4.6–6.5)

## 2019-12-02 MED ORDER — GABAPENTIN 300 MG PO CAPS: ORAL_CAPSULE | ORAL | 2 refills | Status: DC

## 2019-12-02 MED ORDER — PHENTERMINE HCL 37.5 MG PO TABS: 37.5000 mg | ORAL_TABLET | Freq: Every day | ORAL | 1 refills | Status: DC

## 2019-12-02 NOTE — Progress Notes (Signed)
Chief Complaint  Patient presents with  . Follow-up    6 month follow-up   HPI: [name] is a 64 y.o. female, who is here today for chronic disease management.  She was last seen on 08/06/19 Joint (knee and hip mainly) and back pain getting worse. She is becoming less ambulatory,states in a wheel chair most of the time. Knee surgery cannot be done until she loses some wt,BMI < 50.  She has not been consistent with following a healthful diet. Examples of meals yesterday:  Breakfast: 1 cup of cereal with milk. Lunch: 6 fried wings and stir fried rice. Dinner: [place] burger. She is not counting calories. Cannot exercise due to pain.  HgA1C 5.7 in 08/2018.  Upper and lower back pain, the latter one radiated to LLE. Gabapentin 300 mg 2 caps at night has helped. Negative for saddle anesthesia or bowel/bladder dysfunction. She is also following with pain manager.  Stopped Cymbalta 3 months ago. She did not feel much difference in regard to pain or mood.  Vit D deficiency: She is not on vit D supplementation. 25 OH vit D in 08/2018 was 22.2.  B12 deficiency: She was on B12 1000 mcg daily. Last B12 was 205 in 08/2018.  HTN: BP's 130's/80. Negative for severe/frequent headache, visual changes, chest pain, dyspnea, palpitation, claudication, focal weakness, or worsening edema.  Component     Latest Ref Rng & Units 08/16/2018  Glucose     70 - 99 mg/dL 99  BUN     6 - 23 mg/dL 6 (L)  Creatinine     0.40 - 1.20 mg/dL 0.78  GFR, Est Non African American     >59 mL/min/1.73 82  GFR, Est African American     >59 mL/min/1.73 95  BUN/Creatinine Ratio     12 - 28 8 (L)  Sodium     135 - 145 mEq/L 143  Potassium     3.5 - 5.1 mEq/L 4.0  Chloride     96 - 112 mEq/L 100  CO2     19 - 32 mEq/L 23  Calcium     8.4 - 10.5 mg/dL 9.8   C/O soft stools, 1-2 daily,exacerbated by certain food intake. No associated abdominal pain,N/V,or blood in stool. Seems more  frequent but has been intermittent for a while.  HLD: She is not on pharmacologic treatment.  Component     Latest Ref Rng & Units 08/16/2018  Cholesterol, Total     100 - 199 mg/dL 195  Triglycerides     0.0 - 149.0 mg/dL 85  HDL Cholesterol     >39.00 mg/dL 45  VLDL Cholesterol Cal     5 - 40 mg/dL 17  LDL (calc)     0 - 99 mg/dL 133 (H)  LDL/HDL Ratio     0.0 - 3.2 ratio 3.0   Review of Systems  Constitutional: Positive for fatigue. Negative for activity change, appetite change and fever.  HENT: Negative for mouth sores, nosebleeds, sore throat and trouble swallowing.   Eyes: Negative for redness and visual disturbance.  Respiratory: Negative for cough and wheezing.   Gastrointestinal:       Negative for changes in bowel habits.  Endocrine: Negative for polydipsia, polyphagia and polyuria.  Genitourinary: Negative for decreased urine volume, dysuria and hematuria.  Musculoskeletal: Positive for arthralgias and back pain.  Skin: Negative for rash and wound.  Neurological: Negative for syncope and facial asymmetry.  Psychiatric/Behavioral: Negative for  confusion. The patient is nervous/anxious.   Rest of ROS, see pertinent positives sand negatives in HPI  Current Outpatient Medications on File Prior to Visit  Medication Sig Dispense Refill  . albuterol (PROVENTIL HFA;VENTOLIN HFA) 108 (90 Base) MCG/ACT inhaler Inhale 1-2 puffs into the lungs every 6 (six) hours as needed for wheezing or shortness of breath. 3 Inhaler 4  . budesonide-formoterol (SYMBICORT) 160-4.5 MCG/ACT inhaler Inhale 2 puffs into the lungs 2 (two) times daily. 3 Inhaler 4  . diclofenac (VOLTAREN) 75 MG EC tablet Take 1 tablet by mouth twice daily as needed 40 tablet 0  . hydrochlorothiazide (HYDRODIURIL) 25 MG tablet Take 1 tablet (25 mg total) by mouth daily. 90 tablet 1  . HYDROcodone-acetaminophen (NORCO/VICODIN) 5-325 MG tablet TAKE 1 TABLET BY MOUTH 4 TIMES DAILY AS NEEDED FOR PAIN    . lisinopril  (ZESTRIL) 40 MG tablet Take 1 tablet (40 mg total) by mouth daily. 90 tablet 1  . pantoprazole (PROTONIX) 40 MG tablet Take 1 tablet (40 mg total) by mouth daily as needed (for acid reflux).    [name] tiZANidine (ZANAFLEX) 2 MG tablet Take 2 mg by mouth 3 (three) times daily as needed.    . traMADol (ULTRAM) 50 MG tablet Take 50 mg by mouth 2 (two) times daily as needed. for pain    . Zoster Vaccine Adjuvanted [facility]) injection 0.5 ml in muscle and repeat in 8 weeks 0.5 mL 1   No current facility-administered medications on file prior to visit.   Past Medical History:  Diagnosis Date  . Abnormal EKG 06/2003   History of inverted T waves V1-V3. Normal 2D echo (07/23/2003): LVEF 65%.  . Anemia    BL Hgb 11-12. Ferritin 14 - low normal (08/2007). Colonoscopy 2009 - external hemorrhoids (excellent prep). Last anemia panel (12/2010) - Iron  24, TIBC 269,  B12  316, Folate 11.9, Ferritin 73.  . Asthma   . B12 deficiency   . Back pain   . Chest pain   . Degenerative joint disease    BL knees (L>R), lumbar spine. Followed by Sports Medicine, Dr. Jennette Kettle.  . Dyspnea   . History of multiple pulmonary nodules    Incidental finding: CT Abd/ Pelvis (04/2010) - Several small lower lobe lung nodules, including one pure ground-glass pulmonary nodule measuring 8 mm in the left lower lobe.  Recommend follow-up chest CT (IV contrast preferred) in 6 months to document stability. //  CT Abd/ Pelvis (07/2010) -  3 mm RLL and  8 mm LLL nodule stable.  Other nodules unchanged, likely benign.  . Hyperlipidemia   . Hypertension   . Incarcerated ventral hernia 04/2010   Noted on CT Abd/ pelvis (04/2010). Patient now s/p ventral hernia repair by Dr. Gerrit Friends (12/2010)  . Insomnia   . 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 Left hip pain   . Left ventricular diastolic dysfunction   . Leg edema   . Lower extremity edema    Chronic. 2D echo (2005) - EF 65%.  . Obesity   . OSA (obstructive  sleep apnea)   . Palpitations   . Positive D dimer   . Right knee pain    Allergies  Allergen Reactions  . Ketorolac Tromethamine Shortness Of Breath and Palpitations  . Atrovent [Ipratropium] Hives and Rash  . Latex Other (See Comments)    Powdered. Confirm type of reaction with patient.    Social History   Socioeconomic History  .  Marital status: Married    Spouse name: Gerarda Fraction  . Number of children: 3  . Years of education: Not on file  . Highest education level: Not on file  Occupational History  . Occupation: Stay at home  Tobacco Use  . Smoking status: Former Smoker    Packs/day: 0.50    Years: 20.00    Pack years: 10.00    Types: Cigarettes    Quit date: 09/10/1998    Years since quitting: 21.2  . Smokeless tobacco: Never Used  . Tobacco comment: 64 yo   Substance and Sexual Activity  . Alcohol use: No    Alcohol/week: 0.0 standard drinks  . Drug use: No  . Sexual activity: Yes    Partners: Male  Other Topics Concern  . Not on file  Social History Narrative   10/05/2018: Lives with husband, daughter, and 2 grandchildren in [place] house. Lives on main level though mostly.   Has three children altogether, all local, supportive   Currently getting ramp installed in house d/t pt difficulty getting upstairs.      Social Determinants of Health   Financial Resource Strain:   . Difficulty of Paying Living Expenses:   Food Insecurity:   . Worried About Programme researcher, broadcasting/film/video in the Last Year:   . Barista in the Last Year:   Transportation Needs:   . Freight forwarder (Medical):   [name] Lack of Transportation (Non-Medical):   Physical Activity:   . Days of Exercise per Week:   . Minutes of Exercise per Session:   Stress:   . Feeling of Stress :   Social Connections:   . Frequency of Communication with Friends and Family:   . Frequency of Social Gatherings with Friends and Family:   . Attends Religious Services:   . Active Member of Clubs or  Organizations:   . Attends Banker Meetings:   [name] Marital Status:     Vitals:   12/02/19 0827  BP: 122/82  Pulse: 79  Resp: 16  Temp: (!) 97.3 F (36.3 C)  SpO2: 96%   Wt Readings from Last 3 Encounters:  12/02/19 (!) 382 lb 4 oz (173.4 kg)  01/21/19 (!) 365 lb (165.6 kg)  01/02/19 (!) 355 lb 8 oz (161.3 kg)   Body mass index is 63.61 kg/m.  Physical Exam  Nursing note and vitals reviewed. Constitutional: She is oriented to person, place, and time. She appears well-developed. No distress.  HENT:  Head: Normocephalic and atraumatic.  Mouth/Throat: Oropharynx is clear and moist and mucous membranes are normal.  Eyes: Pupils are equal, round, and reactive to light. Conjunctivae are normal.  Cardiovascular: Normal rate and regular rhythm.  No murmur heard. Pulses:      Dorsalis pedis pulses are 2+ on the right side and 2+ on the left side.  Respiratory: Effort normal and breath sounds normal. No respiratory distress.  GI: Soft. She exhibits no mass. There is no hepatomegaly. There is no abdominal tenderness.  Musculoskeletal:        General: Edema (1 + pitting LE edema,bilateral.) present.     Lumbar back: No tenderness or bony tenderness. Decreased range of motion.  Lymphadenopathy:    She has no cervical adenopathy.  Neurological: She is alert and oriented to person, place, and time. She has normal strength. No cranial nerve deficit.  She is in a wheel chair.  Skin: Skin is warm. No rash noted. No erythema.  Psychiatric: She has  a normal mood and affect.  Well groomed, good eye contact.   ASSESSMENT AND PLAN:  Ms. Daquana Paddock was seen today for chronic disease management.  Orders Placed This Encounter  Procedures  . Comprehensive metabolic panel  . Hemoglobin A1c  . Lipid panel  . Vitamin B12  . VITAMIN D 25 Hydroxy (Vit-D Deficiency, Fractures)   Lab Results  Component Value Date   CHOL 202 (H) 12/02/2019   HDL 39.40 12/02/2019    LDLCALC 145 (H) 12/02/2019   TRIG 87.0 12/02/2019   CHOLHDL 5 12/02/2019   Lab Results  Component Value Date   CREATININE 0.69 12/02/2019   BUN 12 12/02/2019   NA 140 12/02/2019   K 4.0 12/02/2019   CL 108 12/02/2019   CO2 26 12/02/2019   Lab Results  Component Value Date   VITAMINB12 97 (L) 12/02/2019   Lab Results  Component Value Date   HGBA1C 5.7 12/02/2019   Lab Results  Component Value Date   ALT 6 12/02/2019   AST 11 12/02/2019   ALKPHOS 105 12/02/2019   BILITOT 0.3 12/02/2019   The 10-year ASCVD risk score Denman George DC Jr., et al., 2013) is: 7.9%   Values used to calculate the score:     Age: 37 years     Sex: Female     Is Non-Hispanic African American: Yes     Diabetic: No     Tobacco smoker: No     Systolic Blood Pressure: 122 mmHg     Is BP treated: Yes     HDL Cholesterol: 39.4 mg/dL     Total Cholesterol: 202 mg/dL   Morbid obesity with BMI of 50.0-59.9, adult (HCC) Has gained about 17 Lb sine 01/2020. We discussed benefits of wt loss as well as adverse effects of obesity. Consistency with healthy diet and low impact physical activity is recommended. Pharmacologic treatment options discussed, Saxenda not covered, so she agrees with trying Phentermine. She understand side effects,including constipation and elevation of BP.  B12 nutritional deficiency Further recommendations according to B12 numbers.  Essential hypertension BP adequately controlled. No changes in current management. Low salt diet recommended. Monitor BP regularly.  Vitamin D deficiency Not on vit D supplementation. Further recommendations according to 25 OH vit D result.  Prediabetes Healthy lifestyle for primary prevention discussed and recommended.  Radiculopathy due to lumbar intervertebral disc disorder Gabapentin helping. Side effects discussed. Instructed to titrate dose from 600 mg daily to 1200 mg daily.  Hyperlipidemia, unspecified hyperlipidemia type Low fat diet  recommended for now. Further recommendations according to FLP results.  Return in about 4 weeks (around 12/30/2019) for wt.   Markevion Lattin G. [name], MD  [facility]. [place] office.  A few things to remember from today's visit:  Phentermine started today. 1/2 tab for 2 weeks and then increase to the whole tab daily before breakfast. Count calories. Stand up a few times during the day to prevent skin ulcer.  If you need refills please call your pharmacy. Do not use My Chart to request refills or for acute issues that need immediate attention.    Please be sure medication list is accurate. If a new problem present, please set up appointment sooner than planned today.

## 2019-12-02 NOTE — Assessment & Plan Note (Signed)
Not on vit D supplementation. Further recommendations according to 25 OH vit D result. 

## 2019-12-02 NOTE — Patient Instructions (Addendum)
A few things to remember from today's visit:   Essential hypertension - Plan: Comprehensive metabolic panel  B12 nutritional deficiency - Plan: Vitamin B12  Hyperlipidemia, unspecified hyperlipidemia type - Plan: Comprehensive metabolic panel, Lipid panel  Morbid obesity with BMI of 50.0-59.9, adult (HCC) - Plan: phentermine (ADIPEX-P) 37.5 MG tablet  Vitamin D deficiency - Plan: Comprehensive metabolic panel, VITAMIN D 25 Hydroxy (Vit-D Deficiency, Fractures)  Prediabetes - Plan: Hemoglobin A1c  Phentermine started today. 1/2 tab for 2 weeks and then increase to the whole tab daily before breakfast. Count calories. Stand up a few times during the day to prevent skin ulcer.  If you need refills please call your pharmacy. Do not use My Chart to request refills or for acute issues that need immediate attention.    Please be sure medication list is accurate. If a new problem present, please set up appointment sooner than planned today.

## 2019-12-02 NOTE — Assessment & Plan Note (Signed)
Healthy lifestyle for primary prevention discussed and recommended.

## 2019-12-02 NOTE — Assessment & Plan Note (Addendum)
Further recommendations according to B12 numbers.

## 2019-12-02 NOTE — Assessment & Plan Note (Signed)
BP adequately controlled. No changes in current management. Low salt diet recommended. Monitor BP regularly.

## 2019-12-02 NOTE — Assessment & Plan Note (Signed)
Gabapentin helping. Side effects discussed. Instructed to titrate dose from 600 mg daily to 1200 mg daily.

## 2019-12-02 NOTE — Assessment & Plan Note (Addendum)
Has gained about 17 Lb sine 01/2020. We discussed benefits of wt loss as well as adverse effects of obesity. Consistency with healthy diet and low impact physical activity is recommended. Pharmacologic treatment options discussed, Saxenda not covered, so she agrees with trying Phentermine. She understand side effects,including constipation and elevation of BP.

## 2019-12-04 ENCOUNTER — Encounter: Payer: Self-pay | Admitting: Family Medicine

## 2019-12-07 MED ORDER — VITAMIN D (ERGOCALCIFEROL) 1.25 MG (50000 UNIT) PO CAPS: 50000.0000 [IU] | ORAL_CAPSULE | ORAL | 0 refills | Status: AC

## 2019-12-13 ENCOUNTER — Other Ambulatory Visit: Payer: Self-pay

## 2019-12-16 ENCOUNTER — Ambulatory Visit (INDEPENDENT_AMBULATORY_CARE_PROVIDER_SITE_OTHER): Payer: Medicare Other

## 2019-12-16 ENCOUNTER — Other Ambulatory Visit: Payer: Self-pay

## 2019-12-16 DIAGNOSIS — E538 Deficiency of other specified B group vitamins: Secondary | ICD-10-CM | POA: Diagnosis not present

## 2019-12-16 MED ORDER — CYANOCOBALAMIN 1000 MCG/ML IJ SOLN
1000.0000 ug | Freq: Once | INTRAMUSCULAR | Status: AC
  Administered 2019-12-16: 1000 ug via INTRAMUSCULAR

## 2019-12-16 NOTE — Progress Notes (Signed)
Per orders of Dr. Betty [name], patient was given her first round of B12 injections in her Right Deltoid and given by Alison Murray. Patient tolerated injection well.

## 2019-12-20 ENCOUNTER — Other Ambulatory Visit: Payer: Self-pay

## 2019-12-23 ENCOUNTER — Ambulatory Visit (INDEPENDENT_AMBULATORY_CARE_PROVIDER_SITE_OTHER): Payer: Medicare Other | Admitting: *Deleted

## 2019-12-23 ENCOUNTER — Other Ambulatory Visit: Payer: Self-pay

## 2019-12-23 DIAGNOSIS — E538 Deficiency of other specified B group vitamins: Secondary | ICD-10-CM

## 2019-12-23 MED ORDER — CYANOCOBALAMIN 1000 MCG/ML IJ SOLN
1000.0000 ug | Freq: Once | INTRAMUSCULAR | Status: AC
  Administered 2019-12-23: 1000 ug via INTRAMUSCULAR

## 2019-12-23 NOTE — Progress Notes (Signed)
Per orders of Dr. [name], injection of 2nd B 12 given by Jobe Gibbon. Patient tolerated injection well.

## 2019-12-27 ENCOUNTER — Other Ambulatory Visit: Payer: Self-pay

## 2019-12-30 ENCOUNTER — Other Ambulatory Visit: Payer: Self-pay

## 2019-12-30 ENCOUNTER — Ambulatory Visit (INDEPENDENT_AMBULATORY_CARE_PROVIDER_SITE_OTHER): Payer: Medicare Other

## 2019-12-30 DIAGNOSIS — E538 Deficiency of other specified B group vitamins: Secondary | ICD-10-CM

## 2019-12-30 MED ORDER — CYANOCOBALAMIN 1000 MCG/ML IJ SOLN
1000.0000 ug | Freq: Once | INTRAMUSCULAR | Status: AC
  Administered 2019-12-30: 1000 ug via INTRAMUSCULAR

## 2019-12-30 NOTE — Progress Notes (Signed)
Per orders of , injection of B12 given in Right deltoid by Eathan Groman R Shankar Silber. Patient tolerated injection well.  

## 2020-01-01 ENCOUNTER — Telehealth: Payer: Self-pay | Admitting: Family Medicine

## 2020-01-01 NOTE — Progress Notes (Signed)
  Chronic Care Management   Note  01/01/2020 Name: [name] MRN: [id number] DOB: 29-Apr-1956  [name] is a 64 y.o. year old female who is a primary care patient of [name], Timoteo Expose, MD. I reached out to Lajoyce Lauber by phone today in response to a referral sent by [name]'s PCP, [name], [name], MD.   [name] was given information about Chronic Care Management services today including:  1. CCM service includes personalized support from designated clinical staff supervised by her physician, including individualized plan of care and coordination with other care providers 2. 24/7 contact phone numbers for assistance for urgent and routine care needs. 3. Service will only be billed when office clinical staff spend 20 minutes or more in a month to coordinate care. 4. Only one practitioner may furnish and bill the service in a calendar month. 5. The patient may stop CCM services at any time (effective at the end of the month) by phone call to the office staff.   Patient agreed to services and verbal consent obtained.   Follow up plan:  Alvie Heidelberg [facility] Scheduler

## 2020-01-06 ENCOUNTER — Other Ambulatory Visit: Payer: Self-pay

## 2020-01-06 ENCOUNTER — Ambulatory Visit (INDEPENDENT_AMBULATORY_CARE_PROVIDER_SITE_OTHER): Payer: Medicare Other | Admitting: Family Medicine

## 2020-01-06 ENCOUNTER — Ambulatory Visit: Payer: Medicare Other

## 2020-01-06 ENCOUNTER — Encounter: Payer: Self-pay | Admitting: Family Medicine

## 2020-01-06 DIAGNOSIS — I10 Essential (primary) hypertension: Secondary | ICD-10-CM | POA: Diagnosis not present

## 2020-01-06 DIAGNOSIS — F325 Major depressive disorder, single episode, in full remission: Secondary | ICD-10-CM

## 2020-01-06 DIAGNOSIS — E538 Deficiency of other specified B group vitamins: Secondary | ICD-10-CM | POA: Diagnosis not present

## 2020-01-06 MED ORDER — CYANOCOBALAMIN 1000 MCG/ML IJ SOLN
1000.0000 ug | Freq: Once | INTRAMUSCULAR | Status: AC
  Administered 2020-01-06: 1000 ug via INTRAMUSCULAR

## 2020-01-06 NOTE — Progress Notes (Signed)
HPI: [name] is a 64 y.o. female, who is here today to follow on recent OV. She was last seen on 12/02/19, when we started pharmacologic treatment for wt loss. Knee OA, she needs to lose wt, BMI < 50 in order to be eligible for total knee replacement. Due to knee/back pain and wt gain, she sits most of the day, here today she is in a wheel chair.  Last visit we started Phentermine 37.5 mg daily for wt loss. Her health insurance did not cover Saxenda.  She has tolerated medication well. Decreased portion size. Snaking on yogurt,ceral,and fruit. Eating more vegetable. Broiling and baking pork chops.  She has not checked BP. She is on HCTZ 25 mg daily and Lisinopril 40 mg daily. She has not noted headache,palpitations,CP,dyspena,nausea,or vomiting.  Lab Results  Component Value Date   CREATININE 0.69 12/02/2019   BUN 12 12/02/2019   NA 140 12/02/2019   K 4.0 12/02/2019   CL 108 12/02/2019   CO2 26 12/02/2019    No changes in bowel habits.  Today she needs B12 1000 mcg IM.  Lab Results  Component Value Date   VITAMINB12 97 (L) 12/02/2019   She was on Cymbalta until a few months ago. She has not noted changes in mood. Denies depression.  Review of Systems  Constitutional: Negative for activity change, appetite change, fatigue and fever.  HENT: Negative for mouth sores and nosebleeds.   Eyes: Negative for redness and visual disturbance.  Respiratory: Negative for shortness of breath.   Cardiovascular: Negative for chest pain, palpitations and leg swelling.  Gastrointestinal: Negative for abdominal pain.  Genitourinary: Negative for decreased urine volume, dysuria and hematuria.  Musculoskeletal: Positive for arthralgias, back pain and gait problem.  Neurological: Negative for tremors, syncope and weakness.  Rest see pertinent positives and negatives per HPI.  Current Outpatient Medications on File Prior to Visit  Medication Sig Dispense Refill    . albuterol (PROVENTIL HFA;VENTOLIN HFA) 108 (90 Base) MCG/ACT inhaler Inhale 1-2 puffs into the lungs every 6 (six) hours as needed for wheezing or shortness of breath. 3 Inhaler 4  . budesonide-formoterol (SYMBICORT) 160-4.5 MCG/ACT inhaler Inhale 2 puffs into the lungs 2 (two) times daily. 3 Inhaler 4  . diclofenac (VOLTAREN) 75 MG EC tablet Take 1 tablet by mouth twice daily as needed 40 tablet 0  . gabapentin (NEURONTIN) 300 MG capsule 300 mg am and noon,and 2 caps at bedtime. 120 capsule 2  . hydrochlorothiazide (HYDRODIURIL) 25 MG tablet Take 1 tablet (25 mg total) by mouth daily. 90 tablet 1  . HYDROcodone-acetaminophen (NORCO/VICODIN) 5-325 MG tablet TAKE 1 TABLET BY MOUTH 4 TIMES DAILY AS NEEDED FOR PAIN    . lisinopril (ZESTRIL) 40 MG tablet Take 1 tablet (40 mg total) by mouth daily. 90 tablet 1  . pantoprazole (PROTONIX) 40 MG tablet Take 1 tablet (40 mg total) by mouth daily as needed (for acid reflux).    . phentermine (ADIPEX-P) 37.5 MG tablet Take 1 tablet (37.5 mg total) by mouth daily before breakfast. 30 tablet 1  . tiZANidine (ZANAFLEX) 2 MG tablet Take 2 mg by mouth 3 (three) times daily as needed.    . traMADol (ULTRAM) 50 MG tablet Take 50 mg by mouth 2 (two) times daily as needed. for pain    . Vitamin D, Ergocalciferol, (DRISDOL) 1.25 MG (50000 UNIT) CAPS capsule Take 1 capsule (50,000 Units total) by mouth every 7 (seven) days for 8 doses. 8 capsule  0  . Zoster Vaccine Adjuvanted [facility]) injection 0.5 ml in muscle and repeat in 8 weeks 0.5 mL 1   No current facility-administered medications on file prior to visit.   Past Medical History:  Diagnosis Date  . Abnormal EKG 06/2003   History of inverted T waves V1-V3. Normal 2D echo (07/23/2003): LVEF 65%.  . Anemia    BL Hgb 11-12. Ferritin 14 - low normal (08/2007). Colonoscopy 2009 - external hemorrhoids (excellent prep). Last anemia panel (12/2010) - Iron  24, TIBC 269,  B12  316, Folate 11.9, Ferritin 73.  .  Asthma   . B12 deficiency   . Back pain   . Chest pain   . Degenerative joint disease    BL knees (L>R), lumbar spine. Followed by Sports Medicine, Dr. Jennette Kettle.  . Dyspnea   . History of multiple pulmonary nodules    Incidental finding: CT Abd/ Pelvis (04/2010) - Several small lower lobe lung nodules, including one pure ground-glass pulmonary nodule measuring 8 mm in the left lower lobe.  Recommend follow-up chest CT (IV contrast preferred) in 6 months to document stability. //  CT Abd/ Pelvis (07/2010) -  3 mm RLL and  8 mm LLL nodule stable.  Other nodules unchanged, likely benign.  . Hyperlipidemia   . Hypertension   . Incarcerated ventral hernia 04/2010   Noted on CT Abd/ pelvis (04/2010). Patient now s/p ventral hernia repair by Dr. Gerrit Friends (12/2010)  . Insomnia   . 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 Left hip pain   . Left ventricular diastolic dysfunction   . Leg edema   . Lower extremity edema    Chronic. 2D echo (2005) - EF 65%.  . Obesity   . OSA (obstructive sleep apnea)   . Palpitations   . Positive D dimer   . Right knee pain    Allergies  Allergen Reactions  . Ketorolac Tromethamine Shortness Of Breath and Palpitations  . Atrovent [Ipratropium] Hives and Rash  . Latex Other (See Comments)    Powdered. Confirm type of reaction with patient.    Social History   Socioeconomic History  . Marital status: Married    Spouse name: Gerarda Fraction  . Number of children: 3  . Years of education: Not on file  . Highest education level: Not on file  Occupational History  . Occupation: Stay at home  Tobacco Use  . Smoking status: Former Smoker    Packs/day: 0.50    Years: 20.00    Pack years: 10.00    Types: Cigarettes    Quit date: 09/10/1998    Years since quitting: 21.3  . Smokeless tobacco: Never Used  . Tobacco comment: 64 yo   Vaping Use  . Vaping Use: Never used  Substance and Sexual Activity  . Alcohol use: No    Alcohol/week: 0.0  standard drinks  . Drug use: No  . Sexual activity: Yes    Partners: Male  Other Topics Concern  . Not on file  Social History Narrative   10/05/2018: Lives with husband, daughter, and 2 grandchildren in [place] house. Lives on main level though mostly.   Has three children altogether, all local, supportive   Currently getting ramp installed in house d/t pt difficulty getting upstairs.      Social Determinants of Health   Financial Resource Strain:   . Difficulty of Paying Living Expenses:   Food Insecurity:   . Worried About [employer] of  Food in the Last Year:   . Ran Out of Food in the Last Year:   Transportation Needs:   . Freight forwarder (Medical):   [name] Lack of Transportation (Non-Medical):   Physical Activity:   . Days of Exercise per Week:   . Minutes of Exercise per Session:   Stress:   . Feeling of Stress :   Social Connections:   . Frequency of Communication with Friends and Family:   . Frequency of Social Gatherings with Friends and Family:   . Attends Religious Services:   . Active Member of Clubs or Organizations:   . Attends Banker Meetings:   [name] Marital Status:    Vitals:   01/06/20 1032  BP: 130/80  Pulse: 70  Resp: 16  Temp: (!) 97 F (36.1 C)  SpO2: 97%   Wt Readings from Last 3 Encounters:  01/06/20 (!) 374 lb 5 oz (169.8 kg)  12/02/19 (!) 382 lb 4 oz (173.4 kg)  01/21/19 (!) 365 lb (165.6 kg)   Body mass index is 62.29 kg/m.  Physical Exam Vitals and nursing note reviewed.  Constitutional:      General: She is not in acute distress.    Appearance: She is well-developed.  HENT:     Head: Normocephalic and atraumatic.  Eyes:     Conjunctiva/sclera: Conjunctivae normal.  Cardiovascular:     Rate and Rhythm: Normal rate and regular rhythm.     Heart sounds: No murmur heard.   Pulmonary:     Effort: Pulmonary effort is normal. No respiratory distress.     Breath sounds: Normal breath sounds.  Musculoskeletal:      Comments: She is in a wheel chair. Antalgic gait when getting on scale.  Skin:    General: Skin is warm.     Findings: No erythema or rash.  Neurological:     Mental Status: She is alert and oriented to person, place, and time.     Cranial Nerves: No cranial nerve deficit.  Psychiatric:     Comments: Well groomed, good eye contact.    ASSESSMENT AND PLAN:  [name] was seen today for follow-up.  Diagnoses and all orders for this visit:  B12 deficiency Continue B12 1000 mcg every 4 weeks. Will re-check next visit.  -     cyanocobalamin ((VITAMIN B-12)) injection 1,000 mcg  Essential hypertension BP adequately controlled. No changes in current management. Continue low salt diet and recommend monitoring BP at home.  Morbid obesity (HCC) She has lost about 8 Lb since her last visit, 12/03/19. We discussed benefits of wt loss as well as adverse effects of obesity. Consistency with healthy diet and physical activity as tolerated recommended.Upper body exercise are more appropriate for now, due to unstable gait due to pain.    Depression, major, single episode, complete remission (HCC) She has not been symptomatic since Cymbalta was discontinued. We will continue monitoring.   Return in about 2 months (around 03/07/2020).    Jamielynn Wigley G. [name], MD  [facility]. [place] office.

## 2020-01-06 NOTE — Assessment & Plan Note (Signed)
BP adequately controlled. No changes in current management. Continue low salt diet and recommend monitoring BP at home.

## 2020-01-06 NOTE — Patient Instructions (Signed)
A few things to remember from today's visit:  Wt Readings from Last 3 Encounters:  01/06/20 (!) 374 lb 5 oz (169.8 kg)  12/02/19 (!) 382 lb 4 oz (173.4 kg)  01/21/19 (!) 365 lb (165.6 kg)   Please bring food diary next visit. Upper body exercises as tolerated.  If you need refills please call your pharmacy. Do not use My Chart to request refills or for acute issues that need immediate attention.    Please be sure medication list is accurate. If a new problem present, please set up appointment sooner than planned today.

## 2020-01-06 NOTE — Assessment & Plan Note (Addendum)
She has lost about 8 Lb since her last visit, 12/03/19. We discussed benefits of wt loss as well as adverse effects of obesity. Consistency with healthy diet and physical activity as tolerated recommended.Upper body exercise are more appropriate for now, due to unstable gait due to pain.

## 2020-01-06 NOTE — Assessment & Plan Note (Signed)
She has not been symptomatic since Cymbalta was discontinued. We will continue monitoring.

## 2020-01-17 ENCOUNTER — Other Ambulatory Visit: Payer: Self-pay | Admitting: Family Medicine

## 2020-01-17 DIAGNOSIS — M5116 Intervertebral disc disorders with radiculopathy, lumbar region: Secondary | ICD-10-CM

## 2020-02-04 ENCOUNTER — Ambulatory Visit: Payer: Medicare Other

## 2020-02-04 ENCOUNTER — Other Ambulatory Visit: Payer: Self-pay

## 2020-02-04 DIAGNOSIS — E538 Deficiency of other specified B group vitamins: Secondary | ICD-10-CM

## 2020-02-04 MED ORDER — CYANOCOBALAMIN 1000 MCG/ML IJ SOLN
1000.0000 ug | Freq: Once | INTRAMUSCULAR | Status: AC
  Administered 2020-02-04: 1000 ug via INTRAMUSCULAR

## 2020-02-04 NOTE — Progress Notes (Signed)
Per orders of Dr Jordan , injection of Cyanocobalamin 1,000 mcg/mL given by Agam Tuohy N Maudie Shingledecker. Patient tolerated injection well.  

## 2020-02-12 ENCOUNTER — Ambulatory Visit (INDEPENDENT_AMBULATORY_CARE_PROVIDER_SITE_OTHER): Payer: Medicare Other

## 2020-02-12 DIAGNOSIS — Z0000 Encounter for general adult medical examination without abnormal findings: Secondary | ICD-10-CM

## 2020-02-12 NOTE — Progress Notes (Signed)
Virtual Visit via Telephone Note  I connected with  Lajoyce Lauber on 02/12/20 at  9:30 AM EDT by telephone and verified that I am speaking with the correct person using two identifiers.  Medicare Annual Wellness visit completed telephonically due to Covid-19 pandemic.   Persons participating in this call: This Health Coach and this patient.   Location: Patient: Home  Provider: Office    I discussed the limitations, risks, security and privacy concerns of performing an evaluation and management service by telephone and the availability of in person appointments. The patient expressed understanding and agreed to proceed.  Unable to perform video visit due to video visit attempted and failed and/or patient does not have video capability.   Some vital signs may be absent or patient reported.   Marzella Schlein, LPN    Subjective:   Arly Salminen is a 64 y.o. female who presents for Medicare Annual (Subsequent) preventive examination.  Review of Systems     Cardiac Risk Factors include: advanced age (>38men, >56 women);obesity (BMI >30kg/m2);hypertension;sedentary lifestyle;dyslipidemia (pre diabetic)     Objective:    Today's Vitals   02/12/20 0936  PainSc: 9    There is no height or weight on file to calculate BMI.  Advanced Directives 02/12/2020 07/23/2019 10/05/2018 12/18/2017 10/25/2017 09/27/2017 01/28/2017  Does Patient Have a Medical Advance Directive? No No (No Data) No No No No  Would patient like information on creating a medical advance directive? Yes (MAU/Ambulatory/Procedural Areas - Information given) - - No - Patient declined No - Patient declined - No - Patient declined    Current Medications (verified) Outpatient Encounter Medications as of 02/12/2020  Medication Sig  . albuterol (PROVENTIL HFA;VENTOLIN HFA) 108 (90 Base) MCG/ACT inhaler Inhale 1-2 puffs into the lungs every 6 (six) hours as needed for wheezing or shortness of breath.  . diclofenac  (VOLTAREN) 75 MG EC tablet Take 1 tablet by mouth twice daily as needed  . gabapentin (NEURONTIN) 300 MG capsule 1 cap am and noon, 2 caps at bedtime.  . hydrochlorothiazide (HYDRODIURIL) 25 MG tablet Take 1 tablet (25 mg total) by mouth daily.  [name] HYDROcodone-acetaminophen (NORCO/VICODIN) 5-325 MG tablet TAKE 1 TABLET BY MOUTH 4 TIMES DAILY AS NEEDED FOR PAIN  . lisinopril (ZESTRIL) 40 MG tablet Take 1 tablet (40 mg total) by mouth daily.  . pantoprazole (PROTONIX) 40 MG tablet Take 1 tablet (40 mg total) by mouth daily as needed (for acid reflux).  . phentermine (ADIPEX-P) 37.5 MG tablet Take 1 tablet (37.5 mg total) by mouth daily before breakfast.  . tiZANidine (ZANAFLEX) 2 MG tablet Take 2 mg by mouth 3 (three) times daily as needed.  . traMADol (ULTRAM) 50 MG tablet Take 50 mg by mouth 2 (two) times daily as needed. for pain  . budesonide-formoterol (SYMBICORT) 160-4.5 MCG/ACT inhaler Inhale 2 puffs into the lungs 2 (two) times daily. (Patient not taking: Reported on 02/12/2020)  . Zoster Vaccine Adjuvanted [facility]) injection 0.5 ml in muscle and repeat in 8 weeks (Patient not taking: Reported on 02/12/2020)   No facility-administered encounter medications on file as of 02/12/2020.    Allergies (verified) Ketorolac tromethamine, Atrovent [ipratropium], and Latex   History: Past Medical History:  Diagnosis Date  . Abnormal EKG 06/2003   History of inverted T waves V1-V3. Normal 2D echo (07/23/2003): LVEF 65%.  . Anemia    BL Hgb 11-12. Ferritin 14 - low normal (08/2007). Colonoscopy 2009 - external hemorrhoids (excellent prep). Last anemia panel (12/2010) -  Iron  24, TIBC 269,  B12  316, Folate 11.9, Ferritin 73.  . Asthma   . B12 deficiency   . Back pain   . Chest pain   . Degenerative joint disease    BL knees (L>R), lumbar spine. Followed by Sports Medicine, Dr. Jennette Kettle.  . Dyspnea   . History of multiple pulmonary nodules    Incidental finding: CT Abd/ Pelvis (04/2010) - Several  small lower lobe lung nodules, including one pure ground-glass pulmonary nodule measuring 8 mm in the left lower lobe.  Recommend follow-up chest CT (IV contrast preferred) in 6 months to document stability. //  CT Abd/ Pelvis (07/2010) -  3 mm RLL and  8 mm LLL nodule stable.  Other nodules unchanged, likely benign.  . Hyperlipidemia   . Hypertension   . Incarcerated ventral hernia 04/2010   Noted on CT Abd/ pelvis (04/2010). Patient now s/p ventral hernia repair by Dr. Gerrit Friends (12/2010)  . Insomnia   . 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 Left hip pain   . Left ventricular diastolic dysfunction   . Leg edema   . Lower extremity edema    Chronic. 2D echo (2005) - EF 65%.  . Obesity   . OSA (obstructive sleep apnea)   . Palpitations   . Positive D dimer   . Right knee pain    Past Surgical History:  Procedure Laterality Date  . COLONOSCOPY  2009  . COLONOSCOPY WITH PROPOFOL N/A 12/18/2017   Procedure: COLONOSCOPY WITH PROPOFOL;  Surgeon: Iva Boop, MD;  Location: [facility];  Service: Endoscopy;  Laterality: N/A;  . FOOT SURGERY  2004    left  . INCISION AND DRAINAGE ABSCESS ANAL  07/2008   I&D and debridgement of anorectal abscess  . INCISIONAL HERNIA REPAIR  12/2010   Repair of incarcerated ventral incisional hernia with Ethicon mesh patch - performed by Dr. Gerrit Friends.   . INGUINAL HERNIA REPAIR    . JOINT REPLACEMENT Left 2013   left knee  . TOTAL KNEE ARTHROPLASTY  06/16/2011   Left TOTAL KNEE ARTHROPLASTY;  Surgeon: Kennieth Rad;  Location: [facility] OR;  Service: Orthopedics;  Laterality: Left;  left total knee arthroplasty  . TUBAL LIGATION     Family History  Problem Relation Age of Onset  . Diabetes Mother   . Hypertension Mother   . Stroke Mother   . Hyperlipidemia Mother   . Heart disease Mother   . Sudden death Mother   . Kidney disease Mother   . Depression Mother   . Dementia Father   . Heart disease Father   . Hyperlipidemia  Father   . Sudden death Father   . Depression Father   . Hypertension Sister   . Diabetes Brother   . Hypertension Brother   . Rashes / Skin problems Maternal Grandfather   . Heart attack Neg Hx    Social History   Socioeconomic History  . Marital status: Married    Spouse name: Gerarda Fraction  . Number of children: 3  . Years of education: Not on file  . Highest education level: Not on file  Occupational History  . Occupation: Stay at home  Tobacco Use  . Smoking status: Former Smoker    Packs/day: 0.50    Years: 20.00    Pack years: 10.00    Types: Cigarettes    Quit date: 09/10/1998    Years since quitting: 21.4  . Smokeless tobacco:  Never Used  . Tobacco comment: 64 yo   Vaping Use  . Vaping Use: Never used  Substance and Sexual Activity  . Alcohol use: No    Alcohol/week: 0.0 standard drinks  . Drug use: No  . Sexual activity: Yes    Partners: Male  Other Topics Concern  . Not on file  Social History Narrative   10/05/2018: [place] husband, daughter, [place] 2 [place]. Lives on main level though mostly.   Has three children altogether, all local, supportive   Currently getting ramp installed in house d/t pt difficulty getting upstairs.      Social Determinants of Health   Financial Resource Strain: Low Risk   . Difficulty of Paying Living Expenses: Not hard at all  Food Insecurity: No Food Insecurity  . Worried About Programme researcher, broadcasting/film/videounning Out of Food in the Last Year: Never true  . Ran Out of Food in the Last Year: Never true  Transportation Needs: No Transportation Needs  . Lack of Transportation (Medical): No  . Lack of Transportation (Non-Medical): No  Physical Activity: Inactive  . Days of Exercise per Week: 0 days  . Minutes of Exercise per Session: 0 min  Stress: No Stress Concern Present  . Feeling of Stress : Not at all  Social Connections: Moderately Integrated  . Frequency of Communication with Friends and Family: More than three times a week    . Frequency of Social Gatherings with Friends and Family: Never  . Attends Religious Services: 1 to 4 times per year  . Active Member of Clubs or Organizations: No  . Attends BankerClub or Organization Meetings: Never  . Marital Status: Married    Tobacco Counseling Counseling given: Not Answered Comment: 64 yo    Clinical Intake:  Pre-visit preparation completed: Yes  Pain : 0-10 Pain Score: 9  Pain Type: Chronic pain Pain Location: Hip (left) Pain Orientation: Left Pain Descriptors / Indicators: Sharp Pain Onset: More than a month ago Pain Frequency: Constant     BMI - recorded: 62.29 Nutritional Status: BMI > 30  Obese Nutritional Risks: Nausea/ vomitting/ diarrhea Diabetes: No  How often do you need to have someone help you when you read instructions, pamphlets, or other written materials from your doctor or pharmacy?: 1 - Never  Diabetic?Pre diabetic  Interpreter Needed?: No  Information entered by :: Lanier Ensignina Ange Puskas, LPN   Activities of Daily Living In your present state of health, do you have any difficulty performing the following activities: 02/12/2020  Hearing? N  Vision? N  Difficulty concentrating or making decisions? N  Walking or climbing stairs? Y  Comment unable to climb related to hippain and unable to raise legs to climb  Dressing or bathing? Y  Comment has a pail to wash in unable to get up stairs to wash in shower  Doing errands, shopping? Y  Comment realted to hip pain, family runs Multimedia programmererrands  Preparing Food and eating ? Y  Comment family assist  Using the Toilet? Y  Comment bedside commode  In the past six months, have you accidently leaked urine? Y  Comment urgency at times  Do you have problems with loss of bowel control? N  Managing your Medications? N  Managing your Finances? N  Some recent data might be hidden    Patient Care Team: [place], [name], MD as PCP - General (Family Medicine) Quintella Reicherturner, Traci R, MD as PCP - Cardiology  (Cardiology) Prudence DavidsonAngelito, Noel V, [facility]PH (Inactive) as Pharmacist (Pharmacist)  Indicate any recent Medical Services you may have received from other than [facility] providers in the past year (date may be approximate).     Assessment:   This is a routine wellness examination for [place].  Hearing/Vision screen  Hearing Screening   125Hz  250Hz  500Hz  1000Hz  2000Hz  3000Hz  4000Hz  6000Hz  8000Hz   Right ear:           Left ear:           Comments: Pt denies any hearing loss at this time  Vision Screening Comments: DR follows up with eye exams annually   Dietary issues and exercise activities discussed: Current Exercise Habits: The patient does not participate in regular exercise at present, Exercise limited by: orthopedic condition(s);Other - see comments (pt states pain is a factor)  Goals    . LDL CALC < 130    . Patient Stated     Sits with your mother 10: 30 to 8:30  During the week May try Dr. , [phone]       . Patient Stated     [place] for joint pain relief    . Patient Stated     Working on trying to get weight down     . Weight (lb) < 317 lb (143.8 kg)     Process of to the [facility] water aerobics      Depression Screen PHQ 2/9 Scores 02/12/2020 10/05/2018 05/09/2018 09/29/2017 09/27/2017 08/18/2016 05/13/2014  PHQ - 2 Score 0 2 4 4 2  0 0  PHQ- 9 Score - 8 12 15 5  - -    Fall Risk Fall Risk  02/12/2020 10/05/2018 08/11/2018 09/27/2017 08/18/2016  Falls in the past year? 0 0 1 Yes Yes  Number falls in past yr: 0 - 1 2 or more 2 or more  Injury with Fall? 0 - 0 - No  Risk Factor Category  - - - - High Fall Risk  Risk for fall due to : Impaired balance/gait;Impaired mobility;Impaired vision History of fall(s);Impaired balance/gait;Impaired mobility;Impaired vision;Medication side effect History of fall(s);Impaired balance/gait;Impaired mobility;Orthopedic patient Impaired mobility;History of fall(s) History of fall(s);Impaired  balance/gait;Impaired mobility  Risk for fall due to: Comment - - - 363lbs with knee issues and balance issues  several falls within the last few months, patient states her knee gives ou without warning  Follow up Falls prevention discussed Education provided;Falls prevention discussed Education provided (No Data) Falls prevention discussed;Education provided  Comment - - - loses her balance  -    Any stairs in or around the home? Yes  If so, are there any without handrails? No  Home free of loose throw rugs in walkways, pet beds, electrical cords, etc? Yes  Adequate lighting in your home to reduce risk of falls? Yes   ASSISTIVE DEVICES UTILIZED TO PREVENT FALLS:  Life alert? No  Use of a cane, walker or w/c? Yes  Grab bars in the bathroom? No  Shower chair or bench in shower? No  Elevated toilet seat or a handicapped toilet? Yes   TIMED UP AND GO:  Was the test performed? No .   Cognitive Function:     6CIT Screen 02/12/2020  What Year? 0 points  What month? 0 points  Count back from 20 0 points  Months in reverse 0 points  Repeat phrase 6 points    Immunizations Immunization History  Administered Date(s) Administered  . Influenza Split 03/29/2012  . Influenza,inj,Quad PF,6+ Mos 04/09/2013,  09/23/2014, 05/04/2015, 04/04/2016, 03/20/2017, 05/02/2018  . Pneumococcal Polysaccharide-23 03/29/2012, 04/04/2016  . Tdap 10/14/2010    TDAP status: Up to date Flu Vaccine status: Up to date Pneumococcal vaccine status: Up to date Covid-19 vaccine status: Declined, Education has been provided regarding the importance of this vaccine but patient still declined. Advised may receive this vaccine at local pharmacy or Health Dept.or vaccine clinic. Aware to provide a copy of the vaccination record if obtained from local pharmacy or Health Dept. Verbalized acceptance and understanding.  Qualifies for Shingles Vaccine? Yes   Zostavax completed No   Shingrix Completed?: No.    Education  has been provided regarding the importance of this vaccine. Patient has been advised to call insurance company to determine out of pocket expense if they have not yet received this vaccine. Advised may also receive vaccine at local pharmacy or Health Dept. Verbalized acceptance and understanding.  Screening Tests Health Maintenance  Topic Date Due  . COVID-19 Vaccine (1) Never done  . INFLUENZA VACCINE  02/09/2020  . TETANUS/TDAP  10/13/2020  . MAMMOGRAM  01/24/2021  . PAP SMEAR-Modifier  01/20/2022  . COLONOSCOPY  12/19/2027  . Hepatitis C Screening  Completed  . HIV Screening  Completed    Health Maintenance  Health Maintenance Due  Topic Date Due  . COVID-19 Vaccine (1) Never done  . INFLUENZA VACCINE  02/09/2020    Colorectal cancer screening: Completed 12/18/17. Repeat every 10 years Mammogram status: Completed 01/25/19. Repeat every year Bone Density status: Completed 01/17/19. Results reflect: Bone density results: OSTEOPOROSIS. Repeat every 2 years.   Additional Screening:  Hepatitis C Screening: Completed 08/18/16  Vision Screening: Recommended annual ophthalmology exams for early detection of glaucoma and other disorders of the eye. Is the patient up to date with their annual eye exam?  Yes  Who is the provider or what is the name of the office in which the patient attends annual eye exams? Dr Dione Booze   Dental Screening: Recommended annual dental exams for proper oral hygiene  Community Resource Referral / Chronic Care Management: CRR required this visit?  No   CCM required this visit?  No      Plan:     I have personally reviewed and noted the following in the patient's chart:   . Medical and social history . Use of alcohol, tobacco or illicit drugs  . Current medications and supplements . Functional ability and status . Nutritional status . Physical activity . Advanced directives . List of other physicians . Hospitalizations, surgeries, and ER visits in  previous 12 months . Vitals . Screenings to include cognitive, depression, and falls . Referrals and appointments  In addition, I have reviewed and discussed with patient certain preventive protocols, quality metrics, and best practice recommendations. A written personalized care plan for preventive services as well as general preventive health recommendations were provided to patient.     Marzella Schlein, LPN   [id number]/0/9811   Nurse Notes: None

## 2020-02-12 NOTE — Patient Instructions (Addendum)
[name] , Thank you for taking time to come for your Medicare Wellness Visit. I appreciate your ongoing commitment to your health goals. Please review the following plan we discussed and let me know if I can assist you in the future.   Screening recommendations/referrals: Colonoscopy: Done 12/18/17 Mammogram: Done 01/25/19 Bone Density: Done 01/17/19 Recommended yearly ophthalmology/optometry visit for glaucoma screening and checkup Recommended yearly dental visit for hygiene and checkup  Vaccinations: Influenza vaccine: Due Pneumococcal vaccine: Up to date Tdap vaccine: Up to date Shingles vaccine: Shingrix discussed. Please contact your pharmacy for coverage information.   Covid-19: Declined with information discussed  Advanced directives: Advance directive discussed with you today. I have provided a copy for you to complete at home and have notarized. Once this is complete please bring a copy in to our office so we can scan it into your chart.   Conditions/risks identified: To work on losing weight  Next appointment: Follow up in one year for your annual wellness visit.    Preventive Care 40-64 Years, Female Preventive care refers to lifestyle choices and visits with your health care provider that can promote health and wellness. What does preventive care include?  A yearly physical exam. This is also called an annual well check.  Dental exams once or twice a year.  Routine eye exams. Ask your health care provider how often you should have your eyes checked.  Personal lifestyle choices, including:  Daily care of your teeth and gums.  Regular physical activity.  Eating a healthy diet.  Avoiding tobacco and drug use.  Limiting alcohol use.  Practicing safe sex.  Taking low-dose aspirin daily starting at age 98.  Taking vitamin and mineral supplements as recommended by your health care provider. What happens during an annual well check? The services and screenings  done by your health care provider during your annual well check will depend on your age, overall health, lifestyle risk factors, and family history of disease. Counseling  Your health care provider may ask you questions about your:  Alcohol use.  Tobacco use.  Drug use.  Emotional well-being.  Home and relationship well-being.  Sexual activity.  Eating habits.  Work and work Statistician.  Method of birth control.  Menstrual cycle.  Pregnancy history. Screening  You may have the following tests or measurements:  Height, weight, and BMI.  Blood pressure.  Lipid and cholesterol levels. These may be checked every 5 years, or more frequently if you are over 32 years old.  Skin check.  Lung cancer screening. You may have this screening every year starting at age 61 if you have a 30-pack-year history of smoking and currently smoke or have quit within the past 15 years.  Fecal occult blood test (FOBT) of the stool. You may have this test every year starting at age 34.  Flexible sigmoidoscopy or colonoscopy. You may have a sigmoidoscopy every 5 years or a colonoscopy every 10 years starting at age 51.  Hepatitis C blood test.  Hepatitis B blood test.  Sexually transmitted disease (STD) testing.  Diabetes screening. This is done by checking your blood sugar (glucose) after you have not eaten for a while (fasting). You may have this done every 1-3 years.  Mammogram. This may be done every 1-2 years. Talk to your health care provider about when you should start having regular mammograms. This may depend on whether you have a family history of breast cancer.  BRCA-related cancer screening. This may be done if you  have a family history of breast, ovarian, tubal, or peritoneal cancers.  Pelvic exam and Pap test. This may be done every 3 years starting at age 48. Starting at age 72, this may be done every 5 years if you have a Pap test in combination with an HPV test.  Bone  density scan. This is done to screen for osteoporosis. You may have this scan if you are at high risk for osteoporosis. Discuss your test results, treatment options, and if necessary, the need for more tests with your health care provider. Vaccines  Your health care provider may recommend certain vaccines, such as:  Influenza vaccine. This is recommended every year.  Tetanus, diphtheria, and acellular pertussis (Tdap, Td) vaccine. You may need a Td booster every 10 years.  Zoster vaccine. You may need this after age 70.  Pneumococcal 13-valent conjugate (PCV13) vaccine. You may need this if you have certain conditions and were not previously vaccinated.  Pneumococcal polysaccharide (PPSV23) vaccine. You may need one or two doses if you smoke cigarettes or if you have certain conditions. Talk to your health care provider about which screenings and vaccines you need and how often you need them. This information is not intended to replace advice given to you by your health care provider. Make sure you discuss any questions you have with your health care provider. Document Released: 07/24/2015 Document Revised: 03/16/2016 Document Reviewed: 04/28/2015 Elsevier Interactive Patient Education  2017 [employer] Prevention in the Home Falls can cause injuries. They can happen to people of all ages. There are many things you can do to make your home safe and to help prevent falls. What can I do on the outside of my home?  Regularly fix the edges of walkways and driveways and fix any cracks.  Remove anything that might make you trip as you walk through a door, such as a raised step or threshold.  Trim any bushes or trees on the path to your home.  Use bright outdoor lighting.  Clear any walking paths of anything that might make someone trip, such as rocks or tools.  Regularly check to see if handrails are loose or broken. Make sure that both sides of any steps have  handrails.  Any raised decks and porches should have guardrails on the edges.  Have any leaves, snow, or ice cleared regularly.  Use sand or salt on walking paths during winter.  Clean up any spills in your garage right away. This includes oil or grease spills. What can I do in the bathroom?  Use night lights.  Install grab bars by the toilet and in the tub and shower. Do not use towel bars as grab bars.  Use non-skid mats or decals in the tub or shower.  If you need to sit down in the shower, use a plastic, non-slip stool.  Keep the floor dry. Clean up any water that spills on the floor as soon as it happens.  Remove soap buildup in the tub or shower regularly.  Attach bath mats securely with double-sided non-slip rug tape.  Do not have throw rugs and other things on the floor that can make you trip. What can I do in the bedroom?  Use night lights.  Make sure that you have a light by your bed that is easy to reach.  Do not use any sheets or blankets that are too big for your bed. They should not hang down onto the floor.  Have  a firm chair that has side arms. You can use this for support while you get dressed.  Do not have throw rugs and other things on the floor that can make you trip. What can I do in the kitchen?  Clean up any spills right away.  Avoid walking on wet floors.  Keep items that you use a lot in easy-to-reach places.  If you need to reach something above you, use a strong step stool that has a grab bar.  Keep electrical cords out of the way.  Do not use floor polish or wax that makes floors slippery. If you must use wax, use non-skid floor wax.  Do not have throw rugs and other things on the floor that can make you trip. What can I do with my stairs?  Do not leave any items on the stairs.  Make sure that there are handrails on both sides of the stairs and use them. Fix handrails that are broken or loose. Make sure that handrails are as long as  the stairways.  Check any carpeting to make sure that it is firmly attached to the stairs. Fix any carpet that is loose or worn.  Avoid having throw rugs at the top or bottom of the stairs. If you do have throw rugs, attach them to the floor with carpet tape.  Make sure that you have a light switch at the top of the stairs and the bottom of the stairs. If you do not have them, ask someone to add them for you. What else can I do to help prevent falls?  Wear shoes that:  Do not have high heels.  Have rubber bottoms.  Are comfortable and fit you well.  Are closed at the toe. Do not wear sandals.  If you use a stepladder:  Make sure that it is fully opened. Do not climb a closed stepladder.  Make sure that both sides of the stepladder are locked into place.  Ask someone to hold it for you, if possible.  Clearly mark and make sure that you can see:  Any grab bars or handrails.  First and last steps.  Where the edge of each step is.  Use tools that help you move around (mobility aids) if they are needed. These include:  Canes.  Walkers.  Scooters.  Crutches.  Turn on the lights when you go into a dark area. Replace any light bulbs as soon as they burn out.  Set up your furniture so you have a clear path. Avoid moving your furniture around.  If any of your floors are uneven, fix them.  If there are any pets around you, be aware of where they are.  Review your medicines with your doctor. Some medicines can make you feel dizzy. This can increase your chance of falling. Ask your doctor what other things that you can do to help prevent falls. This information is not intended to replace advice given to you by your health care provider. Make sure you discuss any questions you have with your health care provider. Document Released: 04/23/2009 Document Revised: 12/03/2015 Document Reviewed: 08/01/2014 Elsevier Interactive Patient Education  2017 [employer].

## 2020-02-18 ENCOUNTER — Emergency Department (HOSPITAL_COMMUNITY): Payer: Medicare Other

## 2020-02-18 ENCOUNTER — Emergency Department (HOSPITAL_COMMUNITY)
Admission: EM | Admit: 2020-02-18 | Discharge: 2020-02-18 | Disposition: A | Discharge status: Home / Self Care | Payer: Medicare Other | Attending: Emergency Medicine | Admitting: Emergency Medicine

## 2020-02-18 ENCOUNTER — Other Ambulatory Visit: Payer: Self-pay

## 2020-02-18 ENCOUNTER — Encounter (HOSPITAL_COMMUNITY): Payer: Self-pay | Admitting: Emergency Medicine

## 2020-02-18 DIAGNOSIS — Z96652 Presence of left artificial knee joint: Secondary | ICD-10-CM | POA: Insufficient documentation

## 2020-02-18 DIAGNOSIS — M1611 Unilateral primary osteoarthritis, right hip: Secondary | ICD-10-CM | POA: Diagnosis not present

## 2020-02-18 DIAGNOSIS — J45909 Unspecified asthma, uncomplicated: Secondary | ICD-10-CM | POA: Insufficient documentation

## 2020-02-18 DIAGNOSIS — M545 Low back pain: Secondary | ICD-10-CM | POA: Diagnosis not present

## 2020-02-18 DIAGNOSIS — M25552 Pain in left hip: Secondary | ICD-10-CM | POA: Diagnosis not present

## 2020-02-18 DIAGNOSIS — I10 Essential (primary) hypertension: Secondary | ICD-10-CM | POA: Insufficient documentation

## 2020-02-18 DIAGNOSIS — M5441 Lumbago with sciatica, right side: Secondary | ICD-10-CM | POA: Diagnosis not present

## 2020-02-18 DIAGNOSIS — Z79899 Other long term (current) drug therapy: Secondary | ICD-10-CM | POA: Insufficient documentation

## 2020-02-18 DIAGNOSIS — Z87891 Personal history of nicotine dependence: Secondary | ICD-10-CM | POA: Diagnosis not present

## 2020-02-18 DIAGNOSIS — M47816 Spondylosis without myelopathy or radiculopathy, lumbar region: Secondary | ICD-10-CM | POA: Diagnosis not present

## 2020-02-18 DIAGNOSIS — Z91040 Latex allergy status: Secondary | ICD-10-CM | POA: Diagnosis not present

## 2020-02-18 DIAGNOSIS — M25551 Pain in right hip: Secondary | ICD-10-CM | POA: Diagnosis not present

## 2020-02-18 MED ORDER — OXYCODONE-ACETAMINOPHEN 5-325 MG PO TABS
2.0000 | ORAL_TABLET | Freq: Once | ORAL | Status: AC
  Administered 2020-02-18: 2 via ORAL
  Filled 2020-02-18: qty 2

## 2020-02-18 MED ORDER — PREDNISONE 10 MG PO TABS
30.0000 mg | ORAL_TABLET | Freq: Every day | ORAL | 0 refills | Status: AC
Start: 2020-02-18 — End: 2020-02-23

## 2020-02-18 MED ORDER — LIDOCAINE 5 % EX PTCH
1.0000 | MEDICATED_PATCH | Freq: Every day | CUTANEOUS | 0 refills | Status: DC | PRN
Start: 2020-02-18 — End: 2021-06-02

## 2020-02-18 MED ORDER — METHOCARBAMOL 500 MG PO TABS
500.0000 mg | ORAL_TABLET | Freq: Three times a day (TID) | ORAL | 0 refills | Status: DC | PRN
Start: 2020-02-18 — End: 2021-06-02

## 2020-02-18 NOTE — ED Provider Notes (Addendum)
[facility]-EMERGENCY DEPT Provider Note   CSN: [id number] Arrival date & time: 02/18/20  1057     History Chief Complaint  Patient presents with  . Back Pain    [name] is a 64 y.o. female with a history of hypertension, hyperlipidemia, OSA, diastolic dysfunction, obesity and radiculopathy due to lumbar intervertebral disc disorder who presents to the ED with complaints of back pain x 4 days. Patient states she was standing up from the commode when her right lower back locked up on her and her RLE felt somewhat numb. She states she has had  pain to this area as well as to the R gluteal area that radiates into the RLE. Pain is constant, worse with movement, no alleviating factors. Tried tizanadine and application of heat without much relief. She states pain in the back feels like a tight piercing pain, has some burning as well which goes into the leg. Has had pain like this before with sciatica in terms of quality, but this is much more uncomfortable in terms of severity. The numbness specifically has not necessarily re-occurred. Denies weakness, saddle anesthesia, incontinence to bowel/bladder, fever, chills, IV drug use, dysuria, or hx of cancer. Patient has not had prior back surgeries.  HPI     Past Medical History:  Diagnosis Date  . Abnormal EKG 06/2003   History of inverted T waves V1-V3. Normal 2D echo (07/23/2003): LVEF 65%.  . Anemia    BL Hgb 11-12. Ferritin 14 - low normal (08/2007). Colonoscopy 2009 - external hemorrhoids (excellent prep). Last anemia panel (12/2010) - Iron  24, TIBC 269,  B12  316, Folate 11.9, Ferritin 73.  . Asthma   . B12 deficiency   . Back pain   . Chest pain   . Degenerative joint disease    BL knees (L>R), lumbar spine. Followed by Sports Medicine, Dr. Jennette Kettle.  . Dyspnea   . History of multiple pulmonary nodules    Incidental finding: CT Abd/ Pelvis (04/2010) - Several small lower lobe lung nodules, including one  pure ground-glass pulmonary nodule measuring 8 mm in the left lower lobe.  Recommend follow-up chest CT (IV contrast preferred) in 6 months to document stability. //  CT Abd/ Pelvis (07/2010) -  3 mm RLL and  8 mm LLL nodule stable.  Other nodules unchanged, likely benign.  . Hyperlipidemia   . Hypertension   . Incarcerated ventral hernia 04/2010   Noted on CT Abd/ pelvis (04/2010). Patient now s/p ventral hernia repair by Dr. Gerrit Friends (12/2010)  . Insomnia   . 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 Left hip pain   . Left ventricular diastolic dysfunction   . Leg edema   . Lower extremity edema    Chronic. 2D echo (2005) - EF 65%.  . Obesity   . OSA (obstructive sleep apnea)   . Palpitations   . Positive D dimer   . Right knee pain     Patient Active Problem List   Diagnosis Date Noted  . Radiculopathy due to lumbar intervertebral disc disorder 12/02/2019  . Depression 12/11/2018  . Vitamin D deficiency 07/18/2018  . Prediabetes 07/18/2018  . Morbid obesity (HCC) 07/18/2018  . Insomnia 11/06/2017  . Chest pain 10/25/2017  . Asthma in adult, mild intermittent, uncomplicated 10/25/2017  . Hypertension 10/25/2017  . Obstructive sleep apnea 10/25/2017  . Left ventricular diastolic dysfunction 10/25/2017  . Positive D dimer 10/25/2017  . Low vitamin  B12 level 10/25/2017  . Depression, major, single episode, complete remission (HCC) 09/29/2017  . 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 B12 nutritional deficiency 09/27/2016  . Hyperlipidemia 04/02/2012  . Lower abdominal pain 12/30/2010  . Preventative health care 09/10/2010  . LUNG NODULE 05/10/2010  . Low back pain 01/20/2009  . Generalized osteoarthritis of multiple sites (left hip,knee,and lower back) 06/02/2008  . ANEMIA-NOS 08/15/2006  . Essential hypertension 08/11/2006  . Allergic rhinitis 08/11/2006    Past Surgical History:  Procedure Laterality Date  . COLONOSCOPY   2009  . COLONOSCOPY WITH PROPOFOL N/A 12/18/2017   Procedure: COLONOSCOPY WITH PROPOFOL;  Surgeon: Iva Boop, MD;  Location: [facility] ENDOSCOPY;  Service: Endoscopy;  Laterality: N/A;  . FOOT SURGERY  2004    left  . INCISION AND DRAINAGE ABSCESS ANAL  07/2008   I&D and debridgement of anorectal abscess  . INCISIONAL HERNIA REPAIR  12/2010   Repair of incarcerated ventral incisional hernia with Ethicon mesh patch - performed by Dr. Gerrit Friends.   . INGUINAL HERNIA REPAIR    . JOINT REPLACEMENT Left 2013   left knee  . TOTAL KNEE ARTHROPLASTY  06/16/2011   Left TOTAL KNEE ARTHROPLASTY;  Surgeon: Kennieth Rad;  Location: [facility] OR;  Service: Orthopedics;  Laterality: Left;  left total knee arthroplasty  . TUBAL LIGATION       OB History    Gravida  3   Para      Term      Preterm      AB      Living  3     SAB      TAB      Ectopic      Multiple      Live Births              Family History  Problem Relation Age of Onset  . Diabetes Mother   . Hypertension Mother   . Stroke Mother   . Hyperlipidemia Mother   . Heart disease Mother   . Sudden death Mother   . Kidney disease Mother   . Depression Mother   . Dementia Father   . Heart disease Father   . Hyperlipidemia Father   . Sudden death Father   . Depression Father   . Hypertension Sister   . Diabetes Brother   . Hypertension Brother   . Rashes / Skin problems Maternal Grandfather   . Heart attack Neg Hx     Social History   Tobacco Use  . Smoking status: Former Smoker    Packs/day: 0.50    Years: 20.00    Pack years: 10.00    Types: Cigarettes    Quit date: 09/10/1998    Years since quitting: 21.4  . Smokeless tobacco: Never Used  . Tobacco comment: 64 yo   Vaping Use  . Vaping Use: Never used  Substance Use Topics  . Alcohol use: No    Alcohol/week: 0.0 standard drinks  . Drug use: No    Home Medications Prior to Admission medications   Medication Sig Start Date End Date Taking?  Authorizing Provider  albuterol (PROVENTIL HFA;VENTOLIN HFA) 108 (90 Base) MCG/ACT inhaler Inhale 1-2 puffs into the lungs every 6 (six) hours as needed for wheezing or shortness of breath. 02/14/18   Waymon Budge, MD  budesonide-formoterol (SYMBICORT) 160-4.5 MCG/ACT inhaler Inhale 2 puffs into the lungs 2 (two) times daily. Patient not taking: Reported on 02/12/2020 02/14/18  Jetty Duhamel D, MD  diclofenac (VOLTAREN) 75 MG EC tablet Take 1 tablet by mouth twice daily as needed 10/14/19   [name], [name], MD  gabapentin (NEURONTIN) 300 MG capsule 1 cap am and noon, 2 caps at bedtime. 01/21/20   [name], [name], MD  hydrochlorothiazide (HYDRODIURIL) 25 MG tablet Take 1 tablet (25 mg total) by mouth daily. 01/02/19   [name], [name], MD  HYDROcodone-acetaminophen (NORCO/VICODIN) 5-325 MG tablet TAKE 1 TABLET BY MOUTH 4 TIMES DAILY AS NEEDED FOR PAIN 11/10/19   [provider]  lisinopril (ZESTRIL) 40 MG tablet Take 1 tablet (40 mg total) by mouth daily. 08/30/19   [name], [name], MD  pantoprazole (PROTONIX) 40 MG tablet Take 1 tablet (40 mg total) by mouth daily as needed (for acid reflux). 12/18/17   Iva Boop, MD  phentermine (ADIPEX-P) 37.5 MG tablet Take 1 tablet (37.5 mg total) by mouth daily before breakfast. 12/02/19   [name], [name], MD  tiZANidine (ZANAFLEX) 2 MG tablet Take 2 mg by mouth 3 (three) times daily as needed. 11/16/19   [provider]  traMADol (ULTRAM) 50 MG tablet Take 50 mg by mouth 2 (two) times daily as needed. for pain 11/22/18   [provider]  Zoster Vaccine Adjuvanted [facility]) injection 0.5 ml in muscle and repeat in 8 weeks Patient not taking: Reported on 02/12/2020 08/08/18   [name], [name], MD    Allergies    Ketorolac tromethamine, Atrovent [ipratropium], and Latex  Review of Systems   Review of Systems  Constitutional: Negative for chills and fever.  Respiratory: Negative for shortness of breath.   Cardiovascular: Negative for chest  pain.  Gastrointestinal: Negative for abdominal pain, nausea and vomiting.  Genitourinary: Negative for dysuria, hematuria and urgency.  Musculoskeletal: Positive for arthralgias and back pain.  Skin: Negative for rash.  Neurological: Positive for numbness (initially). Negative for weakness.       Negative for incontinence/saddle anesthesia.     Physical Exam Updated Vital Signs BP (!) 156/88 (BP Location: Left Arm)   Pulse 68   Temp 98.7 F (37.1 C) (Oral)   Resp 16   Ht 5\' 5"  (1.651 m)   Wt (!) 166.5 kg   LMP 09/09/2009   SpO2 100%   BMI 61.07 kg/m   Physical Exam Constitutional:      General: She is not in acute distress.    Appearance: She is well-developed. She is not toxic-appearing.  HENT:     Head: Normocephalic and atraumatic.  Cardiovascular:     Rate and Rhythm: Normal rate and regular rhythm.     Comments: 2+ symmetric DP pulses bilaterally.  Pulmonary:     Effort: Pulmonary effort is normal.  Abdominal:     General: There is no distension.     Palpations: Abdomen is soft.     Tenderness: There is no abdominal tenderness. There is no guarding or rebound.  Musculoskeletal:     Cervical back: Normal range of motion and neck supple. No spinous process tenderness or muscular tenderness.     Right lower leg: No edema.     Left lower leg: No edema.     Comments: No obvious deformity, appreciable swelling, erythema, ecchymosis, significant open wounds, or increased warmth.   Extremities: Normal ROM. Nontender.  No focal bony tenderness. Back: No point/focal vertebral tenderness, no palpable step off or crepitus.  Tenderness palpation to the right lumbar paraspinal muscles extending to the right gluteal region.  There  is no overlying rash.  Skin:    General: Skin is warm and dry.     Findings: No rash.  Neurological:     Mental Status: She is alert.     Deep Tendon Reflexes:     Reflex Scores:      Patellar reflexes are 2+ on the right side and 2+ on the left  side.    Comments: Sensation grossly intact to bilateral lower extremities. 5/5 symmetric strength with plantar/dorsiflexion bilaterally. Gait is antalgic, patient ambulates with a cane.  Psychiatric:        Mood and Affect: Mood normal.    ED Results / Procedures / Treatments   Labs (all labs ordered are listed, but only abnormal results are displayed) Labs Reviewed - No data to display  EKG None  Radiology DG Lumbar Spine Complete  Result Date: 02/18/2020 CLINICAL DATA:  Low back and bilateral hip pain for 3 days. No acute injury. EXAM: LUMBAR SPINE - COMPLETE 4+ VIEW COMPARISON:  Radiographs 09/06/2019. FINDINGS: There are 5 lumbar type vertebral bodies. The alignment is normal. The disc spaces are preserved. Moderate facet degenerative changes are present inferiorly. No evidence of acute fracture or pars defect. IMPRESSION: Moderate facet degenerative changes inferiorly. No acute osseous findings or malalignment. Electronically Signed   By: Carey BullocksWilliam  Veazey M.D.   On: 02/18/2020 14:06   DG Hip Unilat With Pelvis 2-3 Views Right  Result Date: 02/18/2020 CLINICAL DATA:  Right hip pain. Patient reports right and left hip pain and low back pain for days. No known injury. EXAM: DG HIP (WITH OR WITHOUT PELVIS) 2-3V RIGHT COMPARISON:  Radiograph 03/13/2018 FINDINGS: Advanced left hip osteoarthritis with near complete joint space loss, large subchondral cysts and subchondral sclerosis and peripheral osteophytes. Mild to moderate right hip osteoarthritis with peripheral spurring and mild joint space narrowing. No evidence of a vascular necrosis or focal bone lesion. Pubic rami and remainder of the bony pelvis are intact. Pubic symphysis is congruent. IMPRESSION: 1. Mild to moderate right hip osteoarthritis. 2. Advanced left hip osteoarthritis with near complete joint space loss. Electronically Signed   By: Narda RutherfordMelanie  Sanford M.D.   On: 02/18/2020 14:12    Procedures Procedures (including critical  care time)  Medications Ordered in ED Medications  oxyCODONE-acetaminophen (PERCOCET/ROXICET) 5-325 MG per tablet 2 tablet (2 tablets Oral Given 02/18/20 1310)    ED Course  I have reviewed the triage vital signs and the nursing notes.  Pertinent labs & imaging results that were available during my care of the patient were reviewed by me and considered in my medical decision making (see chart for details).    MDM Rules/Calculators/A&P                         Patient presents to the ED with complaints of back pain after standing up with onset of "locking up" of the right lower back.  Additional history obtained:  Additional history obtained from chart & nursing note review.  MRI Lumbar spine Wo contrast 02/21/20:  IMPRESSION: Progressive facet degeneration at L2-3 now with mild spinal stenosis Mild disc and facet degeneration L3-4 L4-5 without stenosis Progressive facet degeneration L5-S1. Progressive subarticular stenosis bilaterally.  Imaging Studies ordered:  I ordered imaging studies which included x-rays of the L spine & R hip at patient's request, I independently visualized and interpreted imaging which showed degenerative changes without acute process.   No direct trauma, negative for acute process on x-rays- doubt fx/dislocation.  Afebrile, no hx of IVDU- doubt epidural abscess.  No neuro deficits to raise concern for cord compression or cauda equina syndrome.  No urinary sxs to raise concern for pyelo/UTI.  Symmetric pulses, reproducible pain, low suspicion for dissection in terms of clinical presentation.  No overlying rash at this time to raise concern for shingles.   Likely muscular pain/spasm with degree of sciatica.  Will tx w/ short course of steroids, muscle relaxant, & lidoderm patches. We discussed results, treatment plan, need for follow-up, and return precautions with the patient. Provided opportunity for questions, patient confirmed understanding and is in  agreement with plan.   Portions of this note were generated with Scientist, clinical (histocompatibility and immunogenetics). Dictation errors may occur despite best attempts at proofreading.  Final Clinical Impression(s) / ED Diagnoses Final diagnoses:  Acute right-sided low back pain with right-sided sciatica    Rx / DC Orders ED Discharge Orders         Ordered    predniSONE (DELTASONE) 10 MG tablet  Daily     Discontinue  Reprint     02/18/20 1432    methocarbamol (ROBAXIN) 500 MG tablet  Every 8 hours PRN     Discontinue  Reprint     02/18/20 1432    lidocaine (LIDODERM) 5 %  Daily PRN     Discontinue  Reprint     02/18/20 1432           Cisco Kindt, Pleas Koch, PA-C 02/18/20 1433    Sahira Cataldi, Pleas Koch, PA-C 02/18/20 1449    Lorre Nick, MD 02/20/20 [phone]

## 2020-02-18 NOTE — Discharge Instructions (Addendum)
You were seen in the emergency department today for back pain going into your right leg.  Your x-ray showed some degenerative changes in your back and your hips but no fractures or dislocations.  We are sending you home with the following medicines to help with your symptoms:  - Prednisone- this is a steroid medication that will help with pain and swelling. Be sure to take this medication as prescribed with food in the morning.  It should be taken with food, as it can cause stomach upset, and more seriously, stomach bleeding. - Robaxin is the muscle relaxer I have prescribed, this is meant to help with muscle tightness. Be aware that this medication may make you drowsy therefore the first time you take this it should be at a time you are in an environment where you can rest. Do not drive or operate heavy machinery when taking this medication. Do not drink alcohol or take other sedating medications with this medicine such as narcotics or benzodiazepines.  - Lidoderm patch-apply 1 patch to area most significant pain once per day to help soothe the muscle, remove within 12 hours.  You make take Tylenol per over the counter dosing with these medications.   We have prescribed you new medication(s) today. Discuss the medications prescribed today with your pharmacist as they can have adverse effects and interactions with your other medicines including over the counter and prescribed medications. Seek medical evaluation if you start to experience new or abnormal symptoms after taking one of these medicines, seek care immediately if you start to experience difficulty breathing, feeling of your throat closing, facial swelling, or rash as these could be indications of a more serious allergic reaction  Please apply heat to your lower back.  Please follow-up with orthopedics your primary care provider within 1 week for reevaluation.  Return to the ER for new or worsening symptoms including but not limited to worsening  pain, inability to walk, numbness, weakness, loss control of your bowel or bladder function, fever, or any other concerns.

## 2020-02-18 NOTE — ED Triage Notes (Signed)
C/C R lower back pain that radiates down to her R leg. Describes a burning sensation to her groin, she was trying to get up from the commode and out of nowhere she got sharp stabbing pain to her back, denies trauma.

## 2020-02-19 ENCOUNTER — Other Ambulatory Visit: Payer: Self-pay | Admitting: Family Medicine

## 2020-02-19 ENCOUNTER — Telehealth (INDEPENDENT_AMBULATORY_CARE_PROVIDER_SITE_OTHER): Payer: Medicare Other | Admitting: Family Medicine

## 2020-02-19 ENCOUNTER — Encounter: Payer: Self-pay | Admitting: Family Medicine

## 2020-02-19 DIAGNOSIS — Z6841 Body Mass Index (BMI) 40.0 and over, adult: Secondary | ICD-10-CM

## 2020-02-19 DIAGNOSIS — I10 Essential (primary) hypertension: Secondary | ICD-10-CM

## 2020-02-19 DIAGNOSIS — M5441 Lumbago with sciatica, right side: Secondary | ICD-10-CM | POA: Diagnosis not present

## 2020-02-19 NOTE — Progress Notes (Signed)
Virtual Visit via Video Note   I connected with Holly. Fernicola on 02/19/20 by a video enabled telemedicine application and verified that I am speaking with the correct person using two identifiers.  Location patient: home Location provider:work office Persons participating in the virtual visit: patient, provider  I discussed the limitations of evaluation and management by telemedicine and the availability of in person appointments. The patient expressed understanding and agreed to proceed.  HPI: [name] is a 64 yo female with hx of OA,back pain,HTN following today of obesity. She is on Phentermine 37.5 mg daily, which she has tolerated well. She has not weighed herself at home but was in the ER yesterday. BP's 130's/70's. Taking Lisinopril 40 mg daily and HCTZ 25 mg daily.  She has not noted frequent headaches,CP,SOB,palpitations,abdominal pain,changes in bowel habits,or skin rash. Clothes fit better, mainly around waist.  She is able to walk more but exacerbated hip pain.  Last night she was in the ER because right-sided lower back pain. She was getting up from commode,trying to reach her cane, when felt a severe burning sensation in lower back, radiated to right hip and groin area. She is on Gabapentin 300 mg 2 caps at bedtime.  Negative for abdominal pain,changes in bowel habit,N/V,or urinary symptom.  Lumbar X ray:Moderate facet degenerative changes inferiorly. No acute osseous findings or malalignment. Right hip X ray:  1. Mild to moderate right hip osteoarthritis. 2. Advanced left hip osteoarthritis with near complete joint space loss. She was instructed to follow with her orthopedist. Negative for saddle anesthesia and bladder/bowel dysfunction.  She is feeling better today.  ROS: See pertinent positives and negatives per HPI.  Past Medical History:  Diagnosis Date  . Abnormal EKG 06/2003   History of inverted T waves V1-V3. Normal 2D echo (07/23/2003): LVEF 65%.  .  Anemia    BL Hgb 11-12. Ferritin 14 - low normal (08/2007). Colonoscopy 2009 - external hemorrhoids (excellent prep). Last anemia panel (12/2010) - Iron  24, TIBC 269,  B12  316, Folate 11.9, Ferritin 73.  . Asthma   . B12 deficiency   . Back pain   . Chest pain   . Degenerative joint disease    BL knees (L>R), lumbar spine. Followed by Sports Medicine, Dr. Jennette KettleNeal.  . Dyspnea   . History of multiple pulmonary nodules    Incidental finding: CT Abd/ Pelvis (04/2010) - Several small lower lobe lung nodules, including one pure ground-glass pulmonary nodule measuring 8 mm in the left lower lobe.  Recommend follow-up chest CT (IV contrast preferred) in 6 months to document stability. //  CT Abd/ Pelvis (07/2010) -  3 mm RLL and  8 mm LLL nodule stable.  Other nodules unchanged, likely benign.  . Hyperlipidemia   . Hypertension   . Incarcerated ventral hernia 04/2010   Noted on CT Abd/ pelvis (04/2010). Patient now s/p ventral hernia repair by Dr. Gerrit FriendsGerkin (12/2010)  . Insomnia   . 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 Left hip pain   . Left ventricular diastolic dysfunction   . Leg edema   . Lower extremity edema    Chronic. 2D echo (2005) - EF 65%.  . Obesity   . OSA (obstructive sleep apnea)   . Palpitations   . Positive D dimer   . Right knee pain     Past Surgical History:  Procedure Laterality Date  . COLONOSCOPY  2009  . COLONOSCOPY WITH PROPOFOL N/A 12/18/2017  Procedure: COLONOSCOPY WITH PROPOFOL;  Surgeon: Iva Boop, MD;  Location: [facility];  Service: Endoscopy;  Laterality: N/A;  . FOOT SURGERY  2004    left  . INCISION AND DRAINAGE ABSCESS ANAL  07/2008   I&D and debridgement of anorectal abscess  . INCISIONAL HERNIA REPAIR  12/2010   Repair of incarcerated ventral incisional hernia with Ethicon mesh patch - performed by Dr. Gerrit Friends.   . INGUINAL HERNIA REPAIR    . JOINT REPLACEMENT Left 2013   left knee  . TOTAL KNEE ARTHROPLASTY   06/16/2011   Left TOTAL KNEE ARTHROPLASTY;  Surgeon: Kennieth Rad;  Location: [facility] OR;  Service: Orthopedics;  Laterality: Left;  left total knee arthroplasty  . TUBAL LIGATION      Family History  Problem Relation Age of Onset  . Diabetes Mother   . Hypertension Mother   . Stroke Mother   . Hyperlipidemia Mother   . Heart disease Mother   . Sudden death Mother   . Kidney disease Mother   . Depression Mother   . Dementia Father   . Heart disease Father   . Hyperlipidemia Father   . Sudden death Father   . Depression Father   . Hypertension Sister   . Diabetes Brother   . Hypertension Brother   . Rashes / Skin problems Maternal Grandfather   . Heart attack Neg Hx     Social History   Socioeconomic History  . Marital status: Married    Spouse name: Gerarda Fraction  . Number of children: 3  . Years of education: Not on file  . Highest education level: Not on file  Occupational History  . Occupation: Stay at home  Tobacco Use  . Smoking status: Former Smoker    Packs/day: 0.50    Years: 20.00    Pack years: 10.00    Types: Cigarettes    Quit date: 09/10/1998    Years since quitting: 21.4  . Smokeless tobacco: Never Used  . Tobacco comment: 64 yo   Vaping Use  . Vaping Use: Never used  Substance and Sexual Activity  . Alcohol use: No    Alcohol/week: 0.0 standard drinks  . Drug use: No  . Sexual activity: Yes    Partners: Male  Other Topics Concern  . Not on file  Social History Narrative   10/05/2018: Lives with husband, daughter, and 2 grandchildren in [place] house. Lives on main level though mostly.   Has three children altogether, all local, supportive   Currently getting ramp installed in house d/t pt difficulty getting upstairs.      Social Determinants of Health   Financial Resource Strain: Low Risk   . Difficulty of Paying Living Expenses: Not hard at all  Food Insecurity: No Food Insecurity  . Worried About Programme researcher, broadcasting/film/video in the Last Year: Never true   . Ran Out of Food in the Last Year: Never true  Transportation Needs: No Transportation Needs  . Lack of Transportation (Medical): No  . Lack of Transportation (Non-Medical): No  Physical Activity: Inactive  . Days of Exercise per Week: 0 days  . Minutes of Exercise per Session: 0 min  Stress: No Stress Concern Present  . Feeling of Stress : Not at all  Social Connections: Moderately Integrated  . Frequency of Communication with Friends and Family: More than three times a week  . Frequency of Social Gatherings with Friends and Family: Never  . Attends Religious Services: 1 to 4  times per year  . Active Member of Clubs or Organizations: No  . Attends Banker Meetings: Never  . Marital Status: Married  Catering manager Violence: Not At Risk  . Fear of Current or Ex-Partner: No  . Emotionally Abused: No  . Physically Abused: No  . Sexually Abused: No    Current Outpatient Medications:  .  albuterol (PROVENTIL HFA;VENTOLIN HFA) 108 (90 Base) MCG/ACT inhaler, Inhale 1-2 puffs into the lungs every 6 (six) hours as needed for wheezing or shortness of breath., Disp: 3 Inhaler, Rfl: 4 .  diclofenac (VOLTAREN) 75 MG EC tablet, Take 1 tablet by mouth twice daily as needed, Disp: 40 tablet, Rfl: 0 .  gabapentin (NEURONTIN) 300 MG capsule, 1 cap am and noon, 2 caps at bedtime., Disp: 120 capsule, Rfl: 2 .  hydrochlorothiazide (HYDRODIURIL) 25 MG tablet, Take 1 tablet (25 mg total) by mouth daily., Disp: 90 tablet, Rfl: 1 .  HYDROcodone-acetaminophen (NORCO/VICODIN) 5-325 MG tablet, TAKE 1 TABLET BY MOUTH 4 TIMES DAILY AS NEEDED FOR PAIN, Disp: , Rfl:  .  lidocaine (LIDODERM) 5 %, Place 1 patch onto the skin daily as needed. Apply patch to area most significant pain once per day.  Remove and discard patch within 12 hours of application., Disp: 30 patch, Rfl: 0 .  lisinopril (ZESTRIL) 40 MG tablet, Take 1 tablet (40 mg total) by mouth daily., Disp: 90 tablet, Rfl: 1 .  methocarbamol  (ROBAXIN) 500 MG tablet, Take 1 tablet (500 mg total) by mouth every 8 (eight) hours as needed for muscle spasms., Disp: 15 tablet, Rfl: 0 .  pantoprazole (PROTONIX) 40 MG tablet, Take 1 tablet (40 mg total) by mouth daily as needed (for acid reflux)., Disp: , Rfl:  .  phentermine (ADIPEX-P) 37.5 MG tablet, TAKE 1 TABLET BY MOUTH  DAILY BEFORE BREAKFAST, Disp: 30 tablet, Rfl: 1 .  predniSONE (DELTASONE) 10 MG tablet, Take 3 tablets (30 mg total) by mouth daily for 5 days., Disp: 15 tablet, Rfl: 0 .  tiZANidine (ZANAFLEX) 2 MG tablet, Take 2 mg by mouth 3 (three) times daily as needed., Disp: , Rfl:  .  traMADol (ULTRAM) 50 MG tablet, Take 50 mg by mouth 2 (two) times daily as needed. for pain, Disp: , Rfl:   EXAM:  VITALS per patient if applicable:BP 138/70   Wt (!) 367 lb 1.1 oz (166.5 kg)   LMP 09/09/2009   BMI 61.08 kg/m   Wt Readings from Last 3 Encounters:  02/19/20 (!) 367 lb 1.1 oz (166.5 kg)  02/18/20 (!) 367 lb (166.5 kg)  01/06/20 (!) 374 lb 5 oz (169.8 kg)   GENERAL: alert, oriented, appears well and in no acute distress  HEENT: atraumatic, conjunctiva clear, no obvious abnormalities on inspection.  NECK: normal movements of the head and neck  LUNGS: on inspection no signs of respiratory distress, breathing rate appears normal, no obvious gross SOB, gasping or wheezing  CV: no obvious cyanosis  PSYCH/NEURO: pleasant and cooperative, no obvious depression or anxiety, speech and thought processing grossly intact  ASSESSMENT AND PLAN:  Discussed the following assessment and plan:  Morbid obesity (HCC) She is benefiting from Phentermine and has tolerated well. We will continue medication for 2 months and then will consider changing to Ozempic or Saxenda.  Essential hypertension BP adequately controlled. Continue Lisinopril 40 mg daily  Right-sided low back pain with right-sided sciatica, unspecified chronicity Acute exacerbation. Continue Gabapentin 300 mg 2 caps  at bedtime. Keep appt with ortho.  I discussed the assessment and treatment plan with the patient. The patient was provided an opportunity to ask questions and all were answered. The patient agreed with the plan and demonstrated an understanding of the instructions.   The patient was advised to call back or seek an in-person evaluation if the symptoms worsen or if the condition fails to improve as anticipated.  Return in about 2 months (around 04/20/2020) for wt,HTN.    Cadan Maggart [name], MD

## 2020-02-24 ENCOUNTER — Telehealth: Payer: Self-pay

## 2020-02-24 DIAGNOSIS — M159 Polyosteoarthritis, unspecified: Secondary | ICD-10-CM

## 2020-02-24 DIAGNOSIS — I10 Essential (primary) hypertension: Secondary | ICD-10-CM

## 2020-02-24 NOTE — Telephone Encounter (Signed)
-----   Message from Alexandre A Fleury, RPH sent at 02/21/2020  8:21 AM EDT ----- °Regarding: CCM Pharmacist Referral °Jayan Raymundo, ° °Can you please place a referral to chronic care management for this patient? ° °Thanks, °Alex Fleury,PharmD °Clinical Pharmacist °[facility] °[phone] ° ° °

## 2020-02-25 ENCOUNTER — Telehealth: Payer: Self-pay

## 2020-02-25 ENCOUNTER — Other Ambulatory Visit: Payer: Self-pay | Admitting: Family Medicine

## 2020-02-25 DIAGNOSIS — M159 Polyosteoarthritis, unspecified: Secondary | ICD-10-CM

## 2020-02-25 NOTE — Progress Notes (Signed)
Chronic Care Management Pharmacy Assistant   Name: [name]  MRN: [id number] DOB: October 08, 1955  Reason for Encounter: Medication Review /Initial Question for initial visit with clinical pharmacist.   PCP : [name], [name], MD  Allergies:   Allergies  Allergen Reactions   Ketorolac Tromethamine Shortness Of Breath and Palpitations   Atrovent [Ipratropium] Hives and Rash   Latex Other (See Comments)    Powdered. Confirm type of reaction with patient.    Medications: Outpatient Encounter Medications as of 02/25/2020  Medication Sig   albuterol (PROVENTIL HFA;VENTOLIN HFA) 108 (90 Base) MCG/ACT inhaler Inhale 1-2 puffs into the lungs every 6 (six) hours as needed for wheezing or shortness of breath.   diclofenac (VOLTAREN) 75 MG EC tablet Take 1 tablet by mouth twice daily as needed   gabapentin (NEURONTIN) 300 MG capsule 1 cap am and noon, 2 caps at bedtime.   hydrochlorothiazide (HYDRODIURIL) 25 MG tablet Take 1 tablet (25 mg total) by mouth daily.   HYDROcodone-acetaminophen (NORCO/VICODIN) 5-325 MG tablet TAKE 1 TABLET BY MOUTH 4 TIMES DAILY AS NEEDED FOR PAIN   lidocaine (LIDODERM) 5 % Place 1 patch onto the skin daily as needed. Apply patch to area most significant pain once per day.  Remove and discard patch within 12 hours of application.   lisinopril (ZESTRIL) 40 MG tablet Take 1 tablet (40 mg total) by mouth daily.   methocarbamol (ROBAXIN) 500 MG tablet Take 1 tablet (500 mg total) by mouth every 8 (eight) hours as needed for muscle spasms.   pantoprazole (PROTONIX) 40 MG tablet Take 1 tablet (40 mg total) by mouth daily as needed (for acid reflux).   phentermine (ADIPEX-P) 37.5 MG tablet TAKE 1 TABLET BY MOUTH  DAILY BEFORE BREAKFAST   tiZANidine (ZANAFLEX) 2 MG tablet Take 2 mg by mouth 3 (three) times daily as needed.   traMADol (ULTRAM) 50 MG tablet Take 50 mg by mouth 2 (two) times daily as needed. for pain   [DISCONTINUED]  budesonide-formoterol (SYMBICORT) 160-4.5 MCG/ACT inhaler Inhale 2 puffs into the lungs 2 (two) times daily. (Patient not taking: Reported on 02/12/2020)   No facility-administered encounter medications on file as of 02/25/2020.    Current Diagnosis: Patient Active Problem List   Diagnosis Date Noted   Radiculopathy due to lumbar intervertebral disc disorder 12/02/2019   Depression 12/11/2018   Vitamin D deficiency 07/18/2018   Prediabetes 07/18/2018   Morbid obesity (HCC) 07/18/2018   Insomnia 11/06/2017   Chest pain 10/25/2017   Asthma in adult, mild intermittent, uncomplicated 10/25/2017   Hypertension 10/25/2017   Obstructive sleep apnea 10/25/2017   Left ventricular diastolic dysfunction 10/25/2017   Positive D dimer 10/25/2017   Low vitamin B12 level 10/25/2017   Depression, major, single episode, complete remission (HCC) 09/29/2017   Pain due to total left knee replacement (HCC) 10/21/2016   B12 nutritional deficiency 09/27/2016   Hyperlipidemia 04/02/2012   Lower abdominal pain 12/30/2010   Preventative health care 09/10/2010   LUNG NODULE 05/10/2010   Low back pain 01/20/2009   Generalized osteoarthritis of multiple sites (left hip,knee,and lower back) 06/02/2008   ANEMIA-NOS 08/15/2006   Essential hypertension 08/11/2006   Allergic rhinitis 08/11/2006    Goals Addressed   None     Follow-Up:  Pharmacist Review   Have you seen any other providers since your last visit? no Any changes in your medications or health? Yes  Patient stated she went to the ER on 02/18/2020 due to right leg tingling,numbness ,  burning pain that radiated up to her pelvis.Patient stated the ED did not preform any test or lab work but  they prescribed her prednisone. Patient stated she is not taking the prednisone ,she does not want to take the prednisone because of a interaction with current weight medication.  Any side effects from any medications? no Do you have an  symptoms or problems not managed by your medications? no Any concerns about your health right now? Yes  Patient stated she is concern about her weight , and leg discomfort. Has your provider asked that you check blood pressure, blood sugar, or follow special diet at home? Yes  Patient reported her blood pressure last week was 135/80. Patient stated she is unsure of the date due not bing home with her blood pressure  Log.  Patient stated " I do little bit of both home cook, and eating out'. Patient stated she like chicken ,fish,vegatbles and fruits. Do you get any type of exercise on a regular basis? No  Patient stated she does not exercise due to her hip and leg pain. Can you think of a goal you would like to reach for your health?   Patient reported she would like to work on weight loss Do you have any problems getting your medications? no Is there anything that you would like to discuss during the appointment?   Patient stated she would like to discuss leg pain,weight loss and patient assistance for medication she can not afford.  Please bring medications and supplements to appointment  Patient verbalized understanding  Everlean Cherry Clinical Pharmacist Assistant [phone]

## 2020-02-26 ENCOUNTER — Other Ambulatory Visit: Payer: Self-pay

## 2020-02-26 ENCOUNTER — Ambulatory Visit: Payer: Medicare Other

## 2020-02-26 DIAGNOSIS — E785 Hyperlipidemia, unspecified: Secondary | ICD-10-CM

## 2020-02-26 DIAGNOSIS — I10 Essential (primary) hypertension: Secondary | ICD-10-CM

## 2020-02-26 NOTE — Chronic Care Management (AMB) (Signed)
 Chronic Care Management Pharmacy  Name: [name]  MRN: [id number] DOB: 08/22/1955   Chief Complaint/ HPI  [name],  63 y.o. , female presents for their Initial CCM visit with the clinical pharmacist In office.  PCP : Jordan, [name], MD Patient Care Team: Jordan, [name], MD as PCP - General (Family Medicine) Turner, Traci R, MD as PCP - Cardiology (Cardiology) Fleury, Alexandre A, RPH as Pharmacist (Pharmacist)  Their chronic conditions include: Hypertension, Hyperlipidemia, Asthma, Depression, Osteoarthritis, Allergic Rhinitis and Chronic Pain   Office Visits: 02/19/20: Video visit with Dr. Jordan for follow-up. No medication changes made.  01/06/20: Patient presented to Dr. Jordan for follow-up. No medication changes made.  12/02/19: Patient presented to Dr. Jordan for follow-up. Zolpidem, spironolactone, prednisone, metformin, xyzal, duloxetine, bupropion, amlodipine stopped. Gabapentin increased. Pentermine and ergocacliferol started.  Consult Visit: 02/18/20: Patient presented to ED for acute back pain  11/05/19: Patient presented to Dr. Bodea ([facility]) for chronic pain.    Allergies  Allergen Reactions  . Ketorolac Tromethamine Shortness Of Breath and Palpitations  . Atrovent [Ipratropium] Hives and Rash  . Latex Other (See Comments)    Powdered. Confirm type of reaction with patient.    Medications: Outpatient Encounter Medications as of 02/26/2020  Medication Sig  . albuterol (PROVENTIL HFA;VENTOLIN HFA) 108 (90 Base) MCG/ACT inhaler Inhale 1-2 puffs into the lungs every 6 (six) hours as needed for wheezing or shortness of breath.  . gabapentin (NEURONTIN) 300 MG capsule 1 cap am and noon, 2 caps at bedtime.  . hydrochlorothiazide (HYDRODIURIL) 25 MG tablet Take 1 tablet (25 mg total) by mouth daily.  . HYDROcodone-acetaminophen (NORCO/VICODIN) 5-325 MG tablet TAKE 1 TABLET BY MOUTH 4 TIMES DAILY AS NEEDED FOR PAIN  .  lidocaine (LIDODERM) 5 % Place 1 patch onto the skin daily as needed. Apply patch to area most significant pain once per day.  Remove and discard patch within 12 hours of application.  . lisinopril (ZESTRIL) 40 MG tablet Take 1 tablet (40 mg total) by mouth daily.  . methocarbamol (ROBAXIN) 500 MG tablet Take 1 tablet (500 mg total) by mouth every 8 (eight) hours as needed for muscle spasms.  . pantoprazole (PROTONIX) 40 MG tablet Take 1 tablet (40 mg total) by mouth daily as needed (for acid reflux).  . phentermine (ADIPEX-P) 37.5 MG tablet TAKE 1 TABLET BY MOUTH  DAILY BEFORE BREAKFAST  . tiZANidine (ZANAFLEX) 2 MG tablet Take 2 mg by mouth 3 (three) times daily as needed.  . traMADol (ULTRAM) 50 MG tablet Take 50 mg by mouth 2 (two) times daily as needed. for pain  . diclofenac (VOLTAREN) 75 MG EC tablet Take 1 tablet by mouth twice daily as needed (Patient not taking: Reported on 02/26/2020)  . [DISCONTINUED] budesonide-formoterol (SYMBICORT) 160-4.5 MCG/ACT inhaler Inhale 2 puffs into the lungs 2 (two) times daily. (Patient not taking: Reported on 02/12/2020)   No facility-administered encounter medications on file as of 02/26/2020.     Current Diagnosis/Assessment: SDOH Interventions     Most Recent Value  SDOH Interventions  Financial Strain Interventions Intervention Not Indicated  Transportation Interventions Intervention Not Indicated      Goals Addressed            This Visit's Progress   . Chronic Care Management       CARE PLAN ENTRY (see longitudinal plan of care for additional care plan information)  Current Barriers:  . Chronic Disease Management support, education, and   care coordination needs related to Hypertension, Hyperlipidemia, Asthma, Depression, Osteoarthritis, Allergic Rhinitis and Chronic Pain    Hypertension BP Readings from Last 3 Encounters:  02/19/20 138/70  02/18/20 119/70  01/06/20 130/80   . Pharmacist Clinical Goal(s): o Over the next 90 days,  patient will work with PharmD and providers to maintain BP goal <130/80 . Current regimen:  . Hydrochlorothiazide 25 mg daily  . Lisinopril 40 mg daily  . Interventions: o Discussed low salt diet and exercising as tolerated extensively . Patient self care activities - Over the next 90 days, patient will: o Check blood pressure weekly, document, and provide at future appointments o Ensure daily salt intake < 2300 mg/day  Hyperlipidemia Lab Results  Component Value Date/Time   LDLCALC 145 (H) 12/02/2019 09:13 AM   LDLCALC 133 (H) 08/16/2018 12:07 PM   . Pharmacist Clinical Goal(s): o Over the next 90 days, patient will work with PharmD and providers to achieve LDL goal < 100 . Current regimen:  o None . Interventions: o Discussed low cholesterol diet and exercising as tolerated extensively  Medication management . Pharmacist Clinical Goal(s): o Over the next 90 days, patient will work with PharmD and providers to maintain optimal medication adherence . Current pharmacy: [facility] . Interventions o Comprehensive medication review performed. o Continue current medication management strategy . Patient self care activities - Over the next 90 days, patient will: o Take medications as prescribed o Report any questions or concerns to PharmD and/or provider(s)       Asthma    Last spirometry score: FEV1 79% (08/17/18)  Eosinophil count:   Lab Results  Component Value Date/Time   EOSPCT 4.6 11/07/2017 04:35 PM  %                               Eos (Absolute):  Lab Results  Component Value Date/Time   EOSABS 0.3 05/09/2018 10:07 AM   Tobacco Status:  Social History   Tobacco Use  Smoking Status Former Smoker  . Packs/day: 0.50  . Years: 20.00  . Pack years: 10.00  . Types: Cigarettes  . Quit date: 09/10/1998  . Years since quitting: 21.4  Smokeless Tobacco Never Used  Tobacco Comment   64 yo    Patient has failed these meds in past: Symbicort Patient is  currently controlled on the following medications:  . Albuterol (needs refill)     Using maintenance inhaler regularly? No Frequency of rescue inhaler use:  1-2x per week  We discussed:  proper inhaler technique. No longer uses Symbicort due to concerns of glucocorticoid absorption.   Plan  Continue current medications  Hypertension   BP goal is:  <130/80  Office blood pressures are  BP Readings from Last 3 Encounters:  02/19/20 138/70  02/18/20 119/70  01/06/20 130/80   Patient checks BP at home 1-2x per week Patient home BP readings are ranging: n/a  Patient has failed these meds in the past: n/a Patient is currently controlled on the following medications:  . HCTZ 25 mg daily  . Lisinopril 40 mg daily   We discussed diet and exercise extensively. Reports symptoms of feeling lightheaded or tension headaches when blood pressure elevated (160s).   Plan  Continue current medications   Hyperlipidemia   LDL goal < 100  Lipid Panel     Component Value Date/Time   CHOL 202 (H) 12/02/2019 0913   CHOL 195 08/16/2018  1207   TRIG 87.0 12/02/2019 0913   HDL 39.40 12/02/2019 0913   HDL 45 08/16/2018 1207   LDLCALC 145 (H) 12/02/2019 0913   LDLCALC 133 (H) 08/16/2018 1207    Hepatic Function Latest Ref Rng & Units 12/02/2019 08/16/2018 05/09/2018  Total Protein 6.0 - 8.3 g/dL 6.4 6.9 7.0  Albumin 3.5 - 5.2 g/dL 3.8 4.0 4.3  AST 0 - 37 U/L 11 11 13  ALT 0 - 35 U/L 6 10 10  Alk Phosphatase 39 - 117 U/L 105 117 117  Total Bilirubin 0.2 - 1.2 mg/dL 0.3 0.3 0.3  Bilirubin, Direct 0.0 - 0.3 mg/dL - - -     The 10-year ASCVD risk score (Goff DC Jr., et al., 2013) is: 10.9%   Values used to calculate the score:     Age: 63 years     Sex: Female     Is Non-Hispanic African American: Yes     Diabetic: No     Tobacco smoker: No     Systolic Blood Pressure: 138 mmHg     Is BP treated: Yes     HDL Cholesterol: 39.4 mg/dL     Total Cholesterol: 202 mg/dL   Patient has  failed these meds in past: n/a Patient is currently uncontrolled on the following medications:  . None  We discussed:  diet and exercise extensively  Plan  Limit saturated fat intake to achieve LDL goal.   GERD   Patient has failed these meds in past: n/a Patient is currently controlled on the following medications:  . Pantoprazole 40 mg daily PRN   We discussed:  Only uses sporadically. Discussed trigger avoidance.   Plan  Continue current medications   Chronic Pain    Dr. Olin (Orthopedic)   Patient has failed these meds in past: n/a Patient is currently managed on the following medications:  . Diclofenac 75 mg BID PRN (Ran out)  . Gabapentin 300 mg 1 cap AM, 1 cap Noon, 2 cap HS (sometimes will take 2nd capsule at lunch)  . Hydrocodone-APAP 5-325 QID PRN (Rarely uses - only if runs out of other medications)  . Lidocaine 5% patch daily PRN (falls off)  . Methocarbamol 500 mg q8hr PRN (hasn't used)  . Tizanidine 2 mg TID PRN (twice daily)  . Tramadol 50 mg BID PRN (hasn't used in 1-2 weeks)   Discussed at length the importance of combining medications whenever possible to minimize risk of CNS depressants. Discussed avoiding methocarbamol and tizanidine due to duplication of therapy.   Plan  Continue current medications  Misc / OTC   Patient has failed these meds in past: n/a Patient is currently controlled on the following medications:  . Phentermine 37.5 mg daily before breakfast (until October)  We discussed:  Tolerating well  Plan  Continue current medications  Vaccines   Reviewed and discussed patient's vaccination history.    Immunization History  Administered Date(s) Administered  . Influenza Split 03/29/2012  . Influenza,inj,Quad PF,6+ Mos 04/09/2013, 09/23/2014, 05/04/2015, 04/04/2016, 03/20/2017, 05/02/2018  . Pneumococcal Polysaccharide-23 03/29/2012, 04/04/2016  . Tdap 10/14/2010   Medication Management   Pt uses [facility]  pharmacy for all medications Uses pill box? No  Plan  Continue current medication management strategy  Follow up: 6 month phone visit  Alex Fleury,PharmD Clinical Pharmacist  [facility] [phone]  

## 2020-02-28 MED ORDER — DICLOFENAC SODIUM 75 MG PO TBEC: 75.0000 mg | DELAYED_RELEASE_TABLET | Freq: Two times a day (BID) | ORAL | 1 refills | Status: DC | PRN

## 2020-03-06 DIAGNOSIS — M5416 Radiculopathy, lumbar region: Secondary | ICD-10-CM | POA: Diagnosis not present

## 2020-03-06 DIAGNOSIS — M545 Low back pain: Secondary | ICD-10-CM | POA: Diagnosis not present

## 2020-03-06 DIAGNOSIS — M1611 Unilateral primary osteoarthritis, right hip: Secondary | ICD-10-CM | POA: Diagnosis not present

## 2020-03-12 ENCOUNTER — Other Ambulatory Visit: Payer: Self-pay | Admitting: Family Medicine

## 2020-03-12 DIAGNOSIS — I10 Essential (primary) hypertension: Secondary | ICD-10-CM

## 2020-03-19 DIAGNOSIS — H31091 Other chorioretinal scars, right eye: Secondary | ICD-10-CM | POA: Diagnosis not present

## 2020-03-19 DIAGNOSIS — H21342 Primary cyst of pars plana, left eye: Secondary | ICD-10-CM | POA: Diagnosis not present

## 2020-03-19 DIAGNOSIS — H35371 Puckering of macula, right eye: Secondary | ICD-10-CM | POA: Diagnosis not present

## 2020-03-19 DIAGNOSIS — H43393 Other vitreous opacities, bilateral: Secondary | ICD-10-CM | POA: Diagnosis not present

## 2020-03-19 DIAGNOSIS — H35413 Lattice degeneration of retina, bilateral: Secondary | ICD-10-CM | POA: Diagnosis not present

## 2020-04-13 ENCOUNTER — Telehealth: Payer: Self-pay

## 2020-04-13 DIAGNOSIS — I10 Essential (primary) hypertension: Secondary | ICD-10-CM

## 2020-04-13 NOTE — Progress Notes (Signed)
Chronic Care Management Pharmacy Assistant   Name: [name]  MRN: [id number] DOB: Sep 03, 1955  Reason for Encounter:Hypertension  Disease State Call.   PCP : [name], [name], MD  Allergies:   Allergies  Allergen Reactions  . Ketorolac Tromethamine Shortness Of Breath and Palpitations  . Atrovent [Ipratropium] Hives and Rash  . Latex Other (See Comments)    Powdered. Confirm type of reaction with patient.    Medications: Outpatient Encounter Medications as of 04/13/2020  Medication Sig  . albuterol (PROVENTIL HFA;VENTOLIN HFA) 108 (90 Base) MCG/ACT inhaler Inhale 1-2 puffs into the lungs every 6 (six) hours as needed for wheezing or shortness of breath.  . diclofenac (VOLTAREN) 75 MG EC tablet Take 1 tablet (75 mg total) by mouth 2 (two) times daily as needed.  . gabapentin (NEURONTIN) 300 MG capsule 1 cap am and noon, 2 caps at bedtime.  . hydrochlorothiazide (HYDRODIURIL) 25 MG tablet Take 1 tablet (25 mg total) by mouth daily.  [name] HYDROcodone-acetaminophen (NORCO/VICODIN) 5-325 MG tablet TAKE 1 TABLET BY MOUTH 4 TIMES DAILY AS NEEDED FOR PAIN  . lidocaine (LIDODERM) 5 % Place 1 patch onto the skin daily as needed. Apply patch to area most significant pain once per day.  Remove and discard patch within 12 hours of application.  [name] lisinopril (ZESTRIL) 40 MG tablet TAKE 1 TABLET BY MOUTH  DAILY  . methocarbamol (ROBAXIN) 500 MG tablet Take 1 tablet (500 mg total) by mouth every 8 (eight) hours as needed for muscle spasms.  . pantoprazole (PROTONIX) 40 MG tablet Take 1 tablet (40 mg total) by mouth daily as needed (for acid reflux).  . phentermine (ADIPEX-P) 37.5 MG tablet TAKE 1 TABLET BY MOUTH  DAILY BEFORE BREAKFAST  . tiZANidine (ZANAFLEX) 2 MG tablet Take 2 mg by mouth 3 (three) times daily as needed.  . traMADol (ULTRAM) 50 MG tablet Take 50 mg by mouth 2 (two) times daily as needed. for pain  . [DISCONTINUED] budesonide-formoterol (SYMBICORT) 160-4.5 MCG/ACT  inhaler Inhale 2 puffs into the lungs 2 (two) times daily. (Patient not taking: Reported on 02/12/2020)   No facility-administered encounter medications on file as of 04/13/2020.    Current Diagnosis: Patient Active Problem List   Diagnosis Date Noted  . Radiculopathy due to lumbar intervertebral disc disorder 12/02/2019  . Depression 12/11/2018  . Vitamin D deficiency 07/18/2018  . Prediabetes 07/18/2018  . Morbid obesity (HCC) 07/18/2018  . Insomnia 11/06/2017  . Chest pain 10/25/2017  . Asthma in adult, mild intermittent, uncomplicated 10/25/2017  . Hypertension 10/25/2017  . Obstructive sleep apnea 10/25/2017  . Left ventricular diastolic dysfunction 10/25/2017  . Positive D dimer 10/25/2017  . Low vitamin B12 level 10/25/2017  . Depression, major, single episode, complete remission (HCC) 09/29/2017  . 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 B12 nutritional deficiency 09/27/2016  . Hyperlipidemia 04/02/2012  . Lower abdominal pain 12/30/2010  . Preventative health care 09/10/2010  . LUNG NODULE 05/10/2010  . Low back pain 01/20/2009  . Generalized osteoarthritis of multiple sites (left hip,knee,and lower back) 06/02/2008  . ANEMIA-NOS 08/15/2006  . Essential hypertension 08/11/2006  . Allergic rhinitis 08/11/2006     Follow-Up:  Pharmacist Review   Reviewed chart prior to disease state call. Spoke with patient regarding BP  Recent Office Vitals: BP Readings from Last 3 Encounters:  02/19/20 138/70  02/18/20 119/70  01/06/20 130/80   Pulse Readings from Last 3 Encounters:  02/18/20 70  01/06/20 70  12/02/19 79    Wt Readings from Last 3 Encounters:  02/19/20 (!) 367 lb 1.1 oz (166.5 kg)  02/18/20 (!) 367 lb (166.5 kg)  01/06/20 (!) 374 lb 5 oz (169.8 kg)     Kidney Function Lab Results  Component Value Date/Time   CREATININE 0.69 12/02/2019 09:13 AM   CREATININE 0.78 08/16/2018 12:07 PM   CREATININE 0.74 09/15/2017 03:19 PM    CREATININE 0.75 07/16/2014 12:02 PM   GFR 104.08 12/02/2019 09:13 AM   GFRNONAA 82 08/16/2018 12:07 PM   GFRNONAA 81 03/21/2013 03:30 PM   GFRAA 95 08/16/2018 12:07 PM   GFRAA >89 03/21/2013 03:30 PM    BMP Latest Ref Rng & Units 12/02/2019 08/16/2018 05/09/2018  Glucose 70 - 99 mg/dL 94 99 99  BUN 6 - 23 mg/dL 12 6(L) 13  Creatinine 0.40 - 1.20 mg/dL 5.37 4.82 7.07  BUN/Creat Ratio 12 - 28 - 8(L) 16  Sodium 135 - 145 mEq/L 140 143 141  Potassium 3.5 - 5.1 mEq/L 4.0 4.0 4.6  Chloride 96 - 112 mEq/L 108 100 104  CO2 19 - 32 mEq/L 26 23 23   Calcium 8.4 - 10.5 mg/dL 9.4 9.8    . Current antihypertensive regimen:   Hydrochlorothiazide 25 mg daily   Lisinopril 40 mg daily  . How often are you checking your Blood Pressure? weekly . Current home BP readings:  o Patient states her blood pressure ranges 130's-140's/80's . What recent interventions/DTPs have been made by any provider to improve Blood Pressure control since last CPP Visit: None ID . Any recent hospitalizations or ED visits since last visit with CPP? No . What diet changes have been made to improve Blood Pressure Control?  o Patient states she cooks at home without using salt, and may eat out from time to time. . What exercise is being done to improve your Blood Pressure Control?  o Patient reports she in not exercising due to her health conditions.  Adherence Review: Is the patient currently on ACE/ARB medication? Yes Does the patient have >5 day gap between last estimated fill dates? Yes   86.7 Clinical Pharmacist Assistant [phone]     [phone]

## 2020-04-17 ENCOUNTER — Other Ambulatory Visit: Payer: Medicare Other

## 2020-04-17 DIAGNOSIS — Z20822 Contact with and (suspected) exposure to covid-19: Secondary | ICD-10-CM | POA: Diagnosis not present

## 2020-04-18 LAB — SARS-COV-2, NAA 2 DAY TAT

## 2020-04-18 LAB — NOVEL CORONAVIRUS, NAA: SARS-CoV-2, NAA: NOT DETECTED

## 2020-04-27 ENCOUNTER — Ambulatory Visit (INDEPENDENT_AMBULATORY_CARE_PROVIDER_SITE_OTHER): Payer: Medicare Other | Admitting: Family Medicine

## 2020-04-27 ENCOUNTER — Other Ambulatory Visit: Payer: Self-pay

## 2020-04-27 ENCOUNTER — Encounter: Payer: Self-pay | Admitting: Family Medicine

## 2020-04-27 DIAGNOSIS — I10 Essential (primary) hypertension: Secondary | ICD-10-CM

## 2020-04-27 DIAGNOSIS — M5116 Intervertebral disc disorders with radiculopathy, lumbar region: Secondary | ICD-10-CM

## 2020-04-27 DIAGNOSIS — M159 Polyosteoarthritis, unspecified: Secondary | ICD-10-CM | POA: Diagnosis not present

## 2020-04-27 DIAGNOSIS — Z23 Encounter for immunization: Secondary | ICD-10-CM

## 2020-04-27 DIAGNOSIS — J4540 Moderate persistent asthma, uncomplicated: Secondary | ICD-10-CM

## 2020-04-27 DIAGNOSIS — R7303 Prediabetes: Secondary | ICD-10-CM

## 2020-04-27 DIAGNOSIS — Z6841 Body Mass Index (BMI) 40.0 and over, adult: Secondary | ICD-10-CM

## 2020-04-27 MED ORDER — DICLOFENAC SODIUM 75 MG PO TBEC: 75.0000 mg | DELAYED_RELEASE_TABLET | Freq: Two times a day (BID) | ORAL | 1 refills | Status: DC | PRN

## 2020-04-27 MED ORDER — OZEMPIC (0.25 OR 0.5 MG/DOSE) 2 MG/1.5ML ~~LOC~~ SOPN: PEN_INJECTOR | SUBCUTANEOUS | 2 refills | Status: DC

## 2020-04-27 MED ORDER — PHENTERMINE HCL 37.5 MG PO TABS: ORAL_TABLET | ORAL | 0 refills | Status: DC

## 2020-04-27 NOTE — Assessment & Plan Note (Signed)
Healthy lifestyle for primary prevention of diabetes. Ozempic started today.

## 2020-04-27 NOTE — Progress Notes (Signed)
HPI: [name] is a 64 y.o. female, who is here today for 2 months follow up.   She was last seen on 02/19/2020. No new problems since her last visit.  Hypertension: Currently she is on lisinopril 40 mg daily and HCTZ 25 mg daily. Negative for severe/frequent headache, visual changes, chest pain,dyspnea, palpitation, focal weakness, or worsening edema. BP at home has been similar to readings obtained today.  Lab Results  Component Value Date   CREATININE 0.69 12/02/2019   BUN 12 12/02/2019   NA 140 12/02/2019   K 4.0 12/02/2019   CL 108 12/02/2019   CO2 26 12/02/2019   Obesity: Currently she is on phentermine 37.5 mg daily. She has tolerated medication well.  She has not changed dietary changes except for lying in bed after eating. She is still eating small portions. She does not longer enjoys cakes and other sweets.  She cannot exercise regularly due to chronic pain. Prediabetes: She has not noted polydipsia, polyuria, polyphagia.  Lab Results  Component Value Date   HGBA1C 5.7 12/02/2019   Chronic pain: Upper and lower back pain + generalized OA (especially knees/hips).  Evaluated recently by ortho and Prednisone was recommended but she does not want to take it.  RLE burning like sensation. Back pain is severe, getting worse. It is exacerbated by walking short distances in her house. Mobility is getting worse, she is seated most of the time.  She is on Tramadol 50 mg twice daily and sometimes hydrocodone-acetaminophen 5-325 mg. She is also on diclofenac 75 mg twice daily as needed. She feels like topical Voltaren may help more with pain. She does not take oral diclofenac very often. She is also on methocarbamol 500 mg every 8 hours as needed.  She cancel last appt with ortho because possible covid exposures, test negative.  She is planning on rescheduling appointment.  Review of Systems  Constitutional: Positive for fatigue. Negative for  activity change, appetite change and fever.  HENT: Negative for mouth sores, nosebleeds and sore throat.   Eyes: Negative for redness and visual disturbance.  Respiratory: Negative for cough and wheezing.   Gastrointestinal: Negative for abdominal pain, nausea and vomiting.       Negative for changes in bowel habits.  Genitourinary: Negative for decreased urine volume and hematuria.  Musculoskeletal: Positive for arthralgias, back pain and gait problem.  Skin: Negative for pallor and rash.  Allergic/Immunologic: Positive for environmental allergies.  Neurological: Negative for syncope and facial asymmetry.  Psychiatric/Behavioral: Negative for confusion.  Rest of ROS, see pertinent positives sand negatives in HPI  Current Outpatient Medications on File Prior to Visit  Medication Sig Dispense Refill  . albuterol (PROVENTIL HFA;VENTOLIN HFA) 108 (90 Base) MCG/ACT inhaler Inhale 1-2 puffs into the lungs every 6 (six) hours as needed for wheezing or shortness of breath. 3 Inhaler 4  . gabapentin (NEURONTIN) 300 MG capsule 1 cap am and noon, 2 caps at bedtime. 120 capsule 2  . hydrochlorothiazide (HYDRODIURIL) 25 MG tablet Take 1 tablet (25 mg total) by mouth daily. 90 tablet 1  . HYDROcodone-acetaminophen (NORCO/VICODIN) 5-325 MG tablet TAKE 1 TABLET BY MOUTH 4 TIMES DAILY AS NEEDED FOR PAIN    . lidocaine (LIDODERM) 5 % Place 1 patch onto the skin daily as needed. Apply patch to area most significant pain once per day.  Remove and discard patch within 12 hours of application. 30 patch 0  . lisinopril (ZESTRIL) 40 MG tablet TAKE 1 TABLET BY  MOUTH  DAILY 90 tablet 3  . methocarbamol (ROBAXIN) 500 MG tablet Take 1 tablet (500 mg total) by mouth every 8 (eight) hours as needed for muscle spasms. 15 tablet 0  . pantoprazole (PROTONIX) 40 MG tablet Take 1 tablet (40 mg total) by mouth daily as needed (for acid reflux).    [name] tiZANidine (ZANAFLEX) 2 MG tablet Take 2 mg by mouth 3 (three) times daily as  needed.    . traMADol (ULTRAM) 50 MG tablet Take 50 mg by mouth 2 (two) times daily as needed. for pain    . [DISCONTINUED] budesonide-formoterol (SYMBICORT) 160-4.5 MCG/ACT inhaler Inhale 2 puffs into the lungs 2 (two) times daily. (Patient not taking: Reported on 02/12/2020) 3 Inhaler 4   No current facility-administered medications on file prior to visit.   Past Medical History:  Diagnosis Date  . Abnormal EKG 06/2003   History of inverted T waves V1-V3. Normal 2D echo (07/23/2003): LVEF 65%.  . Anemia    BL Hgb 11-12. Ferritin 14 - low normal (08/2007). Colonoscopy 2009 - external hemorrhoids (excellent prep). Last anemia panel (12/2010) - Iron  24, TIBC 269,  B12  316, Folate 11.9, Ferritin 73.  . Asthma   . B12 deficiency   . Back pain   . Chest pain   . Degenerative joint disease    BL knees (L>R), lumbar spine. Followed by Sports Medicine, Dr. Jennette Kettle.  . Dyspnea   . History of multiple pulmonary nodules    Incidental finding: CT Abd/ Pelvis (04/2010) - Several small lower lobe lung nodules, including one pure ground-glass pulmonary nodule measuring 8 mm in the left lower lobe.  Recommend follow-up chest CT (IV contrast preferred) in 6 months to document stability. //  CT Abd/ Pelvis (07/2010) -  3 mm RLL and  8 mm LLL nodule stable.  Other nodules unchanged, likely benign.  . Hyperlipidemia   . Hypertension   . Incarcerated ventral hernia 04/2010   Noted on CT Abd/ pelvis (04/2010). Patient now s/p ventral hernia repair by Dr. Gerrit Friends (12/2010)  . Insomnia   . 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 Left hip pain   . Left ventricular diastolic dysfunction   . Leg edema   . Lower extremity edema    Chronic. 2D echo (2005) - EF 65%.  . Obesity   . OSA (obstructive sleep apnea)   . Palpitations   . Positive D dimer   . Right knee pain    Allergies  Allergen Reactions  . Ketorolac Tromethamine Shortness Of Breath and Palpitations  . Atrovent  [Ipratropium] Hives and Rash  . Latex Other (See Comments)    Powdered. Confirm type of reaction with patient.    Social History   Socioeconomic History  . Marital status: Married    Spouse name: [name]  . Number of children: 3  . Years of education: Not on file  . Highest education level: Not on file  Occupational History  . Occupation: Stay at home  Tobacco Use  . Smoking status: Former Smoker    Packs/day: 0.50    Years: 20.00    Pack years: 10.00    Types: Cigarettes    Quit date: 09/10/1998    Years since quitting: 21.6  . Smokeless tobacco: Never Used  . Tobacco comment: 64 yo   Vaping Use  . Vaping Use: Never used  Substance and Sexual Activity  . Alcohol use: No    Alcohol/week: 0.0  standard drinks  . Drug use: No  . Sexual activity: Yes    Partners: Male  Other Topics Concern  . Not on file  Social History Narrative   10/05/2018: Lives with husband, daughter, and 2 grandchildren in [place] house. Lives on main level though mostly.   Has three children altogether, all local, supportive   Currently getting ramp installed in house d/t pt difficulty getting upstairs.      Social Determinants of Health   Financial Resource Strain: Low Risk   . Difficulty of Paying Living Expenses: Not hard at all  Food Insecurity: No Food Insecurity  . Worried About Programme researcher, broadcasting/film/video in the Last Year: Never true  . Ran Out of Food in the Last Year: Never true  Transportation Needs: No Transportation Needs  . Lack of Transportation (Medical): No  . Lack of Transportation (Non-Medical): No  Physical Activity: Inactive  . Days of Exercise per Week: 0 days  . Minutes of Exercise per Session: 0 min  Stress: No Stress Concern Present  . Feeling of Stress : Not at all  Social Connections: Moderately Integrated  . Frequency of Communication with Friends and Family: More than three times a week  . Frequency of Social Gatherings with Friends and Family: Never  . Attends Religious  Services: 1 to 4 times per year  . Active Member of Clubs or Organizations: No  . Attends Banker Meetings: Never  . Marital Status: Married    Vitals:   04/27/20 1207  BP: 118/72  Resp: 16  Temp: 98.4 F (36.9 C)   Wt Readings from Last 3 Encounters:  04/27/20 (!) 359 lb (162.8 kg)  02/19/20 (!) 367 lb 1.1 oz (166.5 kg)  02/18/20 (!) 367 lb (166.5 kg)   Body mass index is 59.74 kg/m.  Physical Exam Vitals and nursing note reviewed.  Constitutional:      General: She is not in acute distress.    Appearance: She is well-developed.  HENT:     Head: Normocephalic and atraumatic.     Mouth/Throat:     Mouth: Mucous membranes are moist.     Pharynx: Oropharynx is clear.  Eyes:     Conjunctiva/sclera: Conjunctivae normal.  Cardiovascular:     Rate and Rhythm: Normal rate and regular rhythm.     Heart sounds: No murmur heard.      Comments: LE trace pitting edema, bilateral. PT pulses present. Pulmonary:     Effort: Pulmonary effort is normal. No respiratory distress.     Breath sounds: Normal breath sounds.  Abdominal:     Palpations: Abdomen is soft.     Tenderness: There is no abdominal tenderness.  Lymphadenopathy:     Cervical: No cervical adenopathy.  Skin:    General: Skin is warm.     Findings: No erythema or rash.  Neurological:     Mental Status: She is alert and oriented to person, place, and time.     Cranial Nerves: No cranial nerve deficit.     Comments: In a wheel chair.  Psychiatric:     Comments: Well groomed, good eye contact.   ASSESSMENT AND PLAN:  [name] was seen today for 2 months follow-up.  Orders Placed This Encounter  Procedures  . Flu Vaccine QUAD 36+ mos IM  . BASIC METABOLIC PANEL WITH GFR  . Ambulatory referral to General Surgery   Lab Results  Component Value Date   CREATININE 0.83 04/27/2020   BUN  13 04/27/2020   NA 146 04/27/2020   K 4.5 04/27/2020   CL 110 04/27/2020   CO2 18 (L)  04/27/2020    Essential hypertension BP adequately controlled. Continue lisinopril 40 mg daily and HCTZ 25 mg daily. Low-salt diet to continue.   Radiculopathy due to lumbar intervertebral disc disorder Problem is getting worse. Weight loss has not helped. Following with orthopedics.  Prediabetes Healthy lifestyle for primary prevention of diabetes. Ozempic started today.  Morbid obesity (HCC) She has lost about 8 pounds since her last visit. She has been on phentermine for over 12 weeks, we will start weaning medication off. Ozempic started today, she was instructed to start with 0.25 mg [place] weekly for 2 weeks and titrate up as tolerated to 1 mg weekly. Due to painless difficult to engage in regular physical activity. We discussed other treatment options, she would like to consider bariatric procedure. Surgery referral placed.  Generalized osteoarthritis of multiple sites (left hip,knee,and lower back) Explained that she cannot use oral and topical Voltaren. She has not taking diclofenac consistently, recommend trying taking medication daily for 10 to 14 days and monitor for pain improvement.  If she does not think oral diclofenac helps, she can go back to topical. Following with orthopedist. Surgical treatment has been discussed but not offered until BMI is 40 or less.   Return in about 3 months (around 07/28/2020).  [name] G. [name], MD  [facility]. [place] office.   A few things to remember from today's visit:   Generalized osteoarthritis of multiple sites (left hip,knee,and lower back)  Try to take Diclofenac daily for 10-14 days and see if it is worth it to continue. If you decide to do topical voltaren, we need to discontinue oral Diclofenac. Wean off Phentermine and start Ozempic. 1/2 tab of phentermine daily x 7 days, 1/2 tab every other day x 7 days then every 3rd day x 7 and then stop.  Re-schedule appt with ortho.  If you need refills please  call your pharmacy. Do not use My Chart to request refills or for acute issues that need immediate attention.    Please be sure medication list is accurate. If a new problem present, please set up appointment sooner than planned today.

## 2020-04-27 NOTE — Assessment & Plan Note (Addendum)
BP adequately controlled. Continue lisinopril 40 mg daily and HCTZ 25 mg daily. Low-salt diet to continue.

## 2020-04-27 NOTE — Assessment & Plan Note (Signed)
Explained that she cannot use oral and topical Voltaren. She has not taking diclofenac consistently, recommend trying taking medication daily for 10 to 14 days and monitor for pain improvement.  If she does not think oral diclofenac helps, she can go back to topical. Following with orthopedist. Surgical treatment has been discussed but not offered until BMI is 40 or less.

## 2020-04-27 NOTE — Assessment & Plan Note (Signed)
She has lost about 8 pounds since her last visit. She has been on phentermine for over 12 weeks, we will start weaning medication off. Ozempic started today, she was instructed to start with 0.25 mg [place] weekly for 2 weeks and titrate up as tolerated to 1 mg weekly. Due to painless difficult to engage in regular physical activity. We discussed other treatment options, she would like to consider bariatric procedure. Surgery referral placed.

## 2020-04-27 NOTE — Patient Instructions (Addendum)
A few things to remember from today's visit:   Generalized osteoarthritis of multiple sites (left hip,knee,and lower back)  Try to take Diclofenac daily for 10-14 days and see if it is worth it to continue. If you decide to do topical voltaren, we need to discontinue oral Diclofenac. Wean off Phentermine and start Ozempic. 1/2 tab of phentermine daily x 7 days, 1/2 tab every other day x 7 days then every 3rd day x 7 and then stop.  Re-schedule appt with ortho.  If you need refills please call your pharmacy. Do not use My Chart to request refills or for acute issues that need immediate attention.    Please be sure medication list is accurate. If a new problem present, please set up appointment sooner than planned today.

## 2020-04-27 NOTE — Assessment & Plan Note (Signed)
Problem is getting worse. Weight loss has not helped. Following with orthopedics.

## 2020-04-28 LAB — BASIC METABOLIC PANEL WITH GFR
BUN: 13 mg/dL (ref 7–25)
CO2: 18 mmol/L — ABNORMAL LOW (ref 20–32)
Calcium: 10.3 mg/dL (ref 8.6–10.4)
Chloride: 110 mmol/L (ref 98–110)
Creat: 0.83 mg/dL (ref 0.50–0.99)
GFR, Est African American: 87 mL/min/{1.73_m2} (ref >=60)
GFR, Est Non African American: 75 mL/min/{1.73_m2} (ref >=60)
Glucose, Bld: 126 mg/dL — ABNORMAL HIGH (ref 65–99)
Potassium: 4.5 mmol/L (ref 3.5–5.3)
Sodium: 146 mmol/L (ref 135–146)

## 2020-04-28 MED ORDER — ALBUTEROL SULFATE HFA 108 (90 BASE) MCG/ACT IN AERS: 1.0000 | INHALATION_SPRAY | Freq: Four times a day (QID) | RESPIRATORY_TRACT | 0 refills | Status: AC | PRN

## 2020-04-30 ENCOUNTER — Other Ambulatory Visit: Payer: Self-pay | Admitting: Family Medicine

## 2020-04-30 DIAGNOSIS — J4540 Moderate persistent asthma, uncomplicated: Secondary | ICD-10-CM

## 2020-05-04 ENCOUNTER — Encounter: Payer: Self-pay | Admitting: Family Medicine

## 2020-05-21 DIAGNOSIS — G4733 Obstructive sleep apnea (adult) (pediatric): Secondary | ICD-10-CM | POA: Diagnosis not present

## 2020-05-21 DIAGNOSIS — G4700 Insomnia, unspecified: Secondary | ICD-10-CM | POA: Diagnosis not present

## 2020-06-12 ENCOUNTER — Telehealth: Payer: Self-pay | Admitting: Family Medicine

## 2020-06-12 ENCOUNTER — Encounter: Payer: Self-pay | Admitting: Family Medicine

## 2020-06-12 NOTE — Telephone Encounter (Signed)
Patient dropped off forms to be completed by Dr. [name]. Forms placed in folder.

## 2020-06-12 NOTE — Telephone Encounter (Signed)
Form completed & letter competed; on PCP's desk to be signed.

## 2020-06-12 NOTE — Telephone Encounter (Signed)
Forms received.  

## 2020-06-15 NOTE — Telephone Encounter (Signed)
Forms completed, I responded to pt via mychart message letting her know everything has been completed.

## 2020-06-22 NOTE — Progress Notes (Signed)
HPI female former smoker followed for OSA, complicated by morbid obesity, allergic rhinitis, asthma, HBP, hyperlipidemia, diastolic dysfunction, anemia Unattended Home Sleep Test 01/19/17-AHI 8.9/hour, desaturation to 92%, body weight 339 pounds Office Spirometry 08/17/2018-mild restriction, possible mild obstruction.  FVC 2.0/75%, FEV1 1.7/79%, ratio 1.83, FEF 25-75% 2.1/101% -------------------------------------------------------------------------------------------- 08/17/2018- 64 year old female former smoker followed for OSA, complicated by morbid obesity, allergic rhinitis, asthma, HBP, hyperlipidemia, diastolic dysfunction, anemia CPAP auto 5-15/Advanced ----- Has not been using CPAP - family stress etc.  not sleeping well and feels increased SOB lately Body weight today 348 pounds Albuterol HFA, Symbicort 160, Singulair, She owns her machine, but says she was getting blisters on her face under the mask, looser fit did not seal, "bothersome".  Husband here and does say she snores loudly with some witnessed apnea.  She had recorded weight loss since her diagnostic sleep study. She has not usually felt short of breath but describes an episode last week.  While sitting up on the edge of the bed she felt "clogged" in her upper chest, short of breath, sweaty and dizzy with rapid heartbeat briefly.  Self-limited.  No chest pain. She is using her Symbicort inhaler intermittently, as needed and if it is not helpful she will try her rescue inhaler.  Medical education done. Office Spirometry 08/17/2018-mild restriction, possible mild obstruction.  FVC 2.0/75%, FEV1 1.7/79%, ratio 1.83, FEF 25-75% 2.1/101%  06/23/20- 64 year old female former smoker followed for OSA(failed CPAP), complicated by morbid Obesity, allergic Rhinitis, Asthma,Lung Nodules (stable 2014) HTN, hyperlipidemia, Diastolic Dysfunction, Anemia, Lumbar disc disease, Osteoarthritis,  -Albuterol HFA, Symbicort 160, Singulair, CPAP auto  5-15/Advanced>> no longer using Download- Body weight today- 365 lbs              Husband here Covid vax- 2 Moderna Flu vax-had -----Haven't used CPAP in over a year, r/t face breakout with FF mask.  We record her as about 20 lbs heavier than last year, but she says she has lost 20 lbs, hoping to qualify for hip replacement.  Snores and admits daytime sleepiness.  Upper denture- can't use oral appliance.  Agrees to update sleep study. If needs CPAP, then consider a small mask style. CXR 07/24/19-  IMPRESSION: Shallow degree of aeration with probable subsegmental atelectasis.  ROS-see HPI    "+" = positive Constitutional:    weight loss, night sweats, fevers, chills, fatigue, lassitude. HEENT:    headaches, difficulty swallowing, tooth/dental problems, sore throat,       sneezing, +itching, ear ache, nasal congestion, post nasal drip, snoring CV:    chest pain, orthopnea, PND, swelling in lower extremities, anasarca,                                                 dizziness, palpitations Resp:   +shortness of breath with exertion or at rest.                productive cough,   non-productive cough, coughing up of blood.              change in color of mucus.  + wheezing.   Skin:    rash or lesions. GI:  No-   heartburn, indigestion, abdominal pain, nausea, vomiting, diarrhea,                 change in bowel habits, loss of appetite GU: dysuria, change  in color of urine, no urgency or frequency.   flank pain. MS:   +joint pain, stiffness, decreased range of motion, back pain. Neuro-     nothing unusual Psych:  change in mood or affect.  depression or anxiety.   memory loss.  OBJ- Physical Exam General- Alert, Oriented, Affect-appropriate, Distress- none acute, + Morbidly obese, + wheelchair Skin- rash-none, lesions- none, excoriation- none Lymphadenopathy- none Head- atraumatic            Eyes- Gross vision intact, PERRLA, conjunctivae and secretions clear.              Ears-  Hearing, canals-normal            Nose- Clear, no-Septal dev, mucus, polyps, erosion, perforation             Throat- Mallampati II-III , mucosa clear , drainage- none, tonsils- atrophic, +upper denture. Neck- flexible , trachea midline, no stridor , thyroid nl, carotid no bruit Chest - symmetrical excursion , unlabored           Heart/CV- RRR , no murmur heard , no gallop  , no rub, nl s1 s2                           - JVD- none , edema- none, stasis changes- none, varices- none           Lung-clear, wheeze-none, cough-none,  dullness-none, rub- none           Chest wall-  Abd-  Br/ Gen/ Rectal- Not done, not indicated Extrem- cyanosis- none, clubbing, none, atrophy- none, strength- nl Neuro- grossly intact to observation

## 2020-06-23 ENCOUNTER — Encounter: Payer: Self-pay | Admitting: Internal Medicine

## 2020-06-23 ENCOUNTER — Ambulatory Visit (INDEPENDENT_AMBULATORY_CARE_PROVIDER_SITE_OTHER): Payer: Medicare Other | Admitting: Internal Medicine

## 2020-06-23 ENCOUNTER — Other Ambulatory Visit: Payer: Self-pay

## 2020-06-23 VITALS — BP 138/74 | HR 67 | Temp 97.9°F | Ht 65.0 in | Wt 356.0 lb

## 2020-06-23 DIAGNOSIS — G4733 Obstructive sleep apnea (adult) (pediatric): Secondary | ICD-10-CM

## 2020-06-23 NOTE — Assessment & Plan Note (Signed)
She was mild on original study but now weighs more and describes tiredness with snoring.  Plan- Updated HST. If she needs CPAP, will need a small style to limit skin contact.

## 2020-06-23 NOTE — Patient Instructions (Signed)
Order- schedule home sleep test  Dx OSA  Please call us 2 weeks after your sleep test for results and recommendations.

## 2020-06-23 NOTE — Assessment & Plan Note (Signed)
If she will make mental commitment to dietary control, she might eventually lose enough weight to get her hip surgery.

## 2020-07-09 ENCOUNTER — Encounter (HOSPITAL_COMMUNITY): Payer: Self-pay | Admitting: Emergency Medicine

## 2020-07-09 ENCOUNTER — Emergency Department (HOSPITAL_COMMUNITY): Payer: Medicare Other

## 2020-07-09 ENCOUNTER — Emergency Department (HOSPITAL_COMMUNITY)
Admission: EM | Admit: 2020-07-09 | Discharge: 2020-07-09 | Disposition: A | Discharge status: Home / Self Care | Payer: Medicare Other | Attending: Emergency Medicine | Admitting: Emergency Medicine

## 2020-07-09 DIAGNOSIS — U071 COVID-19: Secondary | ICD-10-CM

## 2020-07-09 DIAGNOSIS — Z91040 Latex allergy status: Secondary | ICD-10-CM | POA: Diagnosis not present

## 2020-07-09 DIAGNOSIS — J4520 Mild intermittent asthma, uncomplicated: Secondary | ICD-10-CM | POA: Insufficient documentation

## 2020-07-09 DIAGNOSIS — Z79899 Other long term (current) drug therapy: Secondary | ICD-10-CM | POA: Diagnosis not present

## 2020-07-09 DIAGNOSIS — I10 Essential (primary) hypertension: Secondary | ICD-10-CM | POA: Insufficient documentation

## 2020-07-09 DIAGNOSIS — R059 Cough, unspecified: Secondary | ICD-10-CM | POA: Diagnosis present

## 2020-07-09 DIAGNOSIS — Z87891 Personal history of nicotine dependence: Secondary | ICD-10-CM | POA: Insufficient documentation

## 2020-07-09 DIAGNOSIS — Z96652 Presence of left artificial knee joint: Secondary | ICD-10-CM | POA: Insufficient documentation

## 2020-07-09 DIAGNOSIS — R918 Other nonspecific abnormal finding of lung field: Secondary | ICD-10-CM | POA: Diagnosis not present

## 2020-07-09 DIAGNOSIS — J8489 Other specified interstitial pulmonary diseases: Secondary | ICD-10-CM | POA: Diagnosis not present

## 2020-07-09 LAB — CBC WITH DIFFERENTIAL/PLATELET
Abs Immature Granulocytes: 0.01 10*3/uL (ref 0.00–0.07)
Basophils Absolute: 0.1 10*3/uL (ref 0.0–0.1)
Basophils Relative: 1 %
Eosinophils Absolute: 0.2 10*3/uL (ref 0.0–0.5)
Eosinophils Relative: 3 %
HCT: 41.4 % (ref 36.0–46.0)
Hemoglobin: 12.7 g/dL (ref 12.0–15.0)
Immature Granulocytes: 0 %
Lymphocytes Relative: 31 %
Lymphs Abs: 1.5 10*3/uL (ref 0.7–4.0)
MCH: 27.9 pg (ref 26.0–34.0)
MCHC: 30.7 g/dL (ref 30.0–36.0)
MCV: 90.8 fL (ref 80.0–100.0)
Monocytes Absolute: 0.5 10*3/uL (ref 0.1–1.0)
Monocytes Relative: 11 %
Neutro Abs: 2.7 10*3/uL (ref 1.7–7.7)
Neutrophils Relative %: 54 %
Platelets: 313 10*3/uL (ref 150–400)
RBC: 4.56 MIL/uL (ref 3.87–5.11)
RDW: 14.2 % (ref 11.5–15.5)
WBC: 5 10*3/uL (ref 4.0–10.5)
nRBC: 0 % (ref 0.0–0.2)

## 2020-07-09 LAB — COMPREHENSIVE METABOLIC PANEL
ALT: 8 U/L (ref 0–44)
AST: 14 U/L — ABNORMAL LOW (ref 15–41)
Albumin: 3.5 g/dL (ref 3.5–5.0)
Alkaline Phosphatase: 106 U/L (ref 38–126)
Anion gap: 10 (ref 5–15)
BUN: 8 mg/dL (ref 8–23)
CO2: 22 mmol/L (ref 22–32)
Calcium: 9.6 mg/dL (ref 8.9–10.3)
Chloride: 108 mmol/L (ref 98–111)
Creatinine, Ser: 0.8 mg/dL (ref 0.44–1.00)
GFR, Estimated: >60 mL/min (ref >60)
Glucose, Bld: 93 mg/dL (ref 70–99)
Potassium: 3.8 mmol/L (ref 3.5–5.1)
Sodium: 140 mmol/L (ref 135–145)
Total Bilirubin: 0.5 mg/dL (ref 0.3–1.2)
Total Protein: 7.2 g/dL (ref 6.5–8.1)

## 2020-07-09 LAB — RESP PANEL BY RT-PCR (FLU A&B, COVID) ARPGX2
Influenza A by PCR: NEGATIVE
Influenza B by PCR: NEGATIVE
SARS Coronavirus 2 by RT PCR: POSITIVE — AB

## 2020-07-09 MED ORDER — BENZONATATE 200 MG PO CAPS: 200.0000 mg | ORAL_CAPSULE | Freq: Three times a day (TID) | ORAL | 0 refills | Status: DC | PRN

## 2020-07-09 MED ORDER — PROMETHAZINE-DM 6.25-15 MG/5ML PO SYRP: 5.0000 mL | ORAL_SOLUTION | Freq: Four times a day (QID) | ORAL | 0 refills | Status: DC | PRN

## 2020-07-09 NOTE — Telephone Encounter (Signed)
Called spoke with patient.  Let her know Dr. Roxy Cedar recommendations.  RX sent to preferred pharmacy  Nothing further needed at this time.

## 2020-07-09 NOTE — ED Provider Notes (Signed)
[facility]   CSN: [id number] Arrival date & time: 07/09/20  1334     History Chief Complaint  Patient presents with  . Covid Exposure  . Cough  . Nasal Congestion  . Generalized Body Aches    Holly Lauberngela Webster Fein is a 64 y.o. female.  Patient presents with condition of cough congestion body aches ongoing for 2 days.  She states she had exposure about 3 days ago with her daughter who is found to be Covid positive and the patient lives with his individual.  Otherwise denies shortness of breath or chest pain at this time.  No vomiting or diarrhea.          Past Medical History:  Diagnosis Date  . Abnormal EKG 06/2003   History of inverted T waves V1-V3. Normal 2D echo (07/23/2003): LVEF 65%.  . Anemia    BL Hgb 11-12. Ferritin 14 - low normal (08/2007). Colonoscopy 2009 - external hemorrhoids (excellent prep). Last anemia panel (12/2010) - Iron  24, TIBC 269,  B12  316, Folate 11.9, Ferritin 73.  . Asthma   . B12 deficiency   . Back pain   . Chest pain   . Degenerative joint disease    BL knees (L>R), lumbar spine. Followed by Sports Medicine, Dr. Jennette KettleNeal.  . Dyspnea   . History of multiple pulmonary nodules    Incidental finding: CT Abd/ Pelvis (04/2010) - Several small lower lobe lung nodules, including one pure ground-glass pulmonary nodule measuring 8 mm in the left lower lobe.  Recommend follow-up chest CT (IV contrast preferred) in 6 months to document stability. //  CT Abd/ Pelvis (07/2010) -  3 mm RLL and  8 mm LLL nodule stable.  Other nodules unchanged, likely benign.  . Hyperlipidemia   . Hypertension   . Incarcerated ventral hernia 04/2010   Noted on CT Abd/ pelvis (04/2010). Patient now s/p ventral hernia repair by Dr. Gerrit FriendsGerkin (12/2010)  . Insomnia   . 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 Left hip pain   . Left ventricular diastolic dysfunction   . Leg edema   . Lower  extremity edema    Chronic. 2D echo (2005) - EF 65%.  . Obesity   . OSA (obstructive sleep apnea)   . Palpitations   . Positive D dimer   . Right knee pain     Patient Active Problem List   Diagnosis Date Noted  . Radiculopathy due to lumbar intervertebral disc disorder 12/02/2019  . Depression 12/11/2018  . Vitamin D deficiency 07/18/2018  . Prediabetes 07/18/2018  . Morbid obesity (HCC) 07/18/2018  . Insomnia 11/06/2017  . Chest pain 10/25/2017  . Asthma in adult, mild intermittent, uncomplicated 10/25/2017  . Hypertension 10/25/2017  . Obstructive sleep apnea 10/25/2017  . Left ventricular diastolic dysfunction 10/25/2017  . Positive D dimer 10/25/2017  . Low vitamin B12 level 10/25/2017  . Depression, major, single episode, complete remission (HCC) 09/29/2017  . 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 B12 nutritional deficiency 09/27/2016  . Hyperlipidemia 04/02/2012  . Lower abdominal pain 12/30/2010  . Preventative health care 09/10/2010  . LUNG NODULE 05/10/2010  . Low back pain 01/20/2009  . Generalized osteoarthritis of multiple sites (left hip,knee,and lower back) 06/02/2008  . ANEMIA-NOS 08/15/2006  . Essential hypertension 08/11/2006  . Allergic rhinitis 08/11/2006    Past Surgical History:  Procedure Laterality Date  . COLONOSCOPY  2009  . COLONOSCOPY WITH PROPOFOL N/A 12/18/2017   Procedure: COLONOSCOPY WITH PROPOFOL;  Surgeon: Iva Boop, MD;  Location: [facility];  Service: Endoscopy;  Laterality: N/A;  . FOOT SURGERY  2004    left  . INCISION AND DRAINAGE ABSCESS ANAL  07/2008   I&D and debridgement of anorectal abscess  . INCISIONAL HERNIA REPAIR  12/2010   Repair of incarcerated ventral incisional hernia with Ethicon mesh patch - performed by Dr. Gerrit Friends.   . INGUINAL HERNIA REPAIR    . JOINT REPLACEMENT Left 2013   left knee  . TOTAL KNEE ARTHROPLASTY  06/16/2011   Left TOTAL KNEE ARTHROPLASTY;  Surgeon: Kennieth Rad;   Location: [facility] OR;  Service: Orthopedics;  Laterality: Left;  left total knee arthroplasty  . TUBAL LIGATION       OB History    Gravida  3   Para      Term      Preterm      AB      Living  3     SAB      IAB      Ectopic      Multiple      Live Births              Family History  Problem Relation Age of Onset  . Diabetes Mother   . Hypertension Mother   . Stroke Mother   . Hyperlipidemia Mother   . Heart disease Mother   . Sudden death Mother   . Kidney disease Mother   . Depression Mother   . Dementia Father   . Heart disease Father   . Hyperlipidemia Father   . Sudden death Father   . Depression Father   . Hypertension Sister   . Diabetes Brother   . Hypertension Brother   . Rashes / Skin problems Maternal Grandfather   . Heart attack Neg Hx     Social History   Tobacco Use  . Smoking status: Former Smoker    Packs/day: 0.50    Years: 20.00    Pack years: 10.00    Types: Cigarettes    Quit date: 09/10/1998    Years since quitting: 21.8  . Smokeless tobacco: Never Used  . Tobacco comment: 64 yo   Vaping Use  . Vaping Use: Never used  Substance Use Topics  . Alcohol use: No    Alcohol/week: 0.0 standard drinks  . Drug use: No    Home Medications Prior to Admission medications   Medication Sig Start Date End Date Taking? Authorizing Provider  albuterol (VENTOLIN HFA) 108 (90 Base) MCG/ACT inhaler Inhale 1-2 puffs into the lungs every 6 (six) hours as needed for wheezing or shortness of breath. Patient taking differently: Inhale 1-2 puffs into the lungs every 4 (four) hours as needed for wheezing or shortness of breath. 04/28/20  Yes [name], [name], MD  benzonatate (TESSALON) 200 MG capsule Take 1 capsule (200 mg total) by mouth every 8 (eight) hours as needed for cough. 07/09/20  Yes Young, Joni Fears D, MD  diclofenac (VOLTAREN) 75 MG EC tablet Take 1 tablet (75 mg total) by mouth 2 (two) times daily as needed. Patient taking differently:  Take 75 mg by mouth 2 (two) times daily as needed for mild pain. 04/27/20  Yes [name], [name], MD  gabapentin (NEURONTIN) 300 MG capsule 1 cap am and noon, 2 caps at bedtime. Patient taking differently: Take 300-600 mg by mouth See admin instructions.  Take 300 mg by mouth in the morning & at noon, and 600 mg at bedtime 01/21/20  Yes [name], [name], MD  lisinopril (ZESTRIL) 40 MG tablet TAKE 1 TABLET BY MOUTH  DAILY Patient taking differently: Take 40 mg by mouth daily. 03/13/20  Yes [name], [name], MD  Semaglutide,0.25 or 0.5MG /DOS, (OZEMPIC, 0.25 OR 0.5 MG/DOSE,) 2 MG/1.5ML SOPN Start with 0.25 mg weekly x 2 then 0.5 mg weekly and 1 mg . Patient taking differently: Inject 0.5 mg into the skin every Tuesday. 04/27/20  Yes [name], [name], MD  hydrochlorothiazide (HYDRODIURIL) 25 MG tablet Take 1 tablet (25 mg total) by mouth daily. Patient not taking: Reported on 07/09/2020 01/02/19   [name], [name], MD  lidocaine (LIDODERM) 5 % Place 1 patch onto the skin daily as needed. Apply patch to area most significant pain once per day.  Remove and discard patch within 12 hours of application. Patient not taking: Reported on 07/09/2020 02/18/20   Petrucelli, Pleas Koch, PA-C  methocarbamol (ROBAXIN) 500 MG tablet Take 1 tablet (500 mg total) by mouth every 8 (eight) hours as needed for muscle spasms. 02/18/20   Petrucelli, Samantha R, PA-C  pantoprazole (PROTONIX) 40 MG tablet Take 1 tablet (40 mg total) by mouth daily as needed (for acid reflux). Patient not taking: Reported on 07/09/2020 12/18/17   Iva Boop, MD  budesonide-formoterol [facility]) 160-4.5 MCG/ACT inhaler Inhale 2 puffs into the lungs 2 (two) times daily. Patient not taking: Reported on 02/12/2020 02/14/18 02/18/20  Jetty Duhamel D, MD    Allergies    Ketorolac tromethamine, Atrovent [ipratropium], Latex, and Tape  Review of Systems   Review of Systems  Constitutional: Positive for fever.  HENT: Negative for ear pain.   Eyes: Negative  for pain.  Respiratory: Positive for cough.   Cardiovascular: Negative for chest pain.  Gastrointestinal: Negative for abdominal pain.  Genitourinary: Negative for flank pain.  Musculoskeletal: Negative for back pain.  Skin: Negative for rash.  Neurological: Negative for headaches.    Physical Exam Updated Vital Signs BP 118/67 (BP Location: Right Arm)   Pulse 76   Temp 98.5 F (36.9 C) (Oral)   Resp 20   Ht 5\' 5"  (1.651 m)   Wt (!) 161.5 kg   LMP 09/09/2009   SpO2 97%   BMI 59.24 kg/m   Physical Exam Constitutional:      General: She is not in acute distress.    Appearance: Normal appearance.  HENT:     Head: Normocephalic.     Nose: Nose normal.  Eyes:     Extraocular Movements: Extraocular movements intact.  Cardiovascular:     Rate and Rhythm: Normal rate.  Pulmonary:     Effort: Pulmonary effort is normal.  Musculoskeletal:        General: Normal range of motion.     Cervical back: Normal range of motion.  Neurological:     General: No focal deficit present.     Mental Status: She is alert.     ED Results / Procedures / Treatments   Labs (all labs ordered are listed, but only abnormal results are displayed) Labs Reviewed  RESP PANEL BY RT-PCR (FLU A&B, COVID) ARPGX2 - Abnormal; Notable for the following components:      Result Value   SARS Coronavirus 2 by RT PCR POSITIVE (*)    All other components within normal limits  COMPREHENSIVE METABOLIC PANEL - Abnormal; Notable for the following components:   AST 14 (*)  All other components within normal limits  CBC WITH DIFFERENTIAL/PLATELET    EKG None  Radiology DG Chest Portable 1 View  Result Date: 07/09/2020 CLINICAL DATA:  64 year old female with COVID exposure EXAM: PORTABLE CHEST 1 VIEW COMPARISON:  None. FINDINGS: Cardiomediastinal silhouette unchanged in size and contour. No pneumothorax. No pleural effusion. Low lung volumes with coarsened interstitial markings. No confluent airspace  disease. No displaced fracture. IMPRESSION: Low lung volumes without evidence of acute cardiopulmonary disease Electronically Signed   By: Gilmer Mor D.O.   On: 07/09/2020 14:20    Procedures Procedures (including critical care time)  Medications Ordered in ED Medications - No data to display  ED Course  I have reviewed the triage vital signs and the nursing notes.  Pertinent labs & imaging results that were available during my care of the patient were reviewed by me and considered in my medical decision making (see chart for details).    MDM Rules/Calculators/A&P                          Patient found Covid positive normal vital signs otherwise.  She states that she is vaccinated.  Advised follow-up with her doctor in 2 or 3 days.  Advised me to return for worsening symptoms fevers vomiting cough or any additional concerns.    Final Clinical Impression(s) / ED Diagnoses Final diagnoses:  COVID-19 virus infection    Rx / DC Orders ED Discharge Orders    None       Cheryll Cockayne, MD 07/09/20 1815

## 2020-07-09 NOTE — Telephone Encounter (Signed)
She already has 2 narcotic meds- hydrocodone and tramadol, so we can't add more. Please offer tessalon perles, 200 mg, # 30, 1 every 8 hours as needed. She can also use otc Delsym cough syrup and throat lozenges like Ricola.  She should get Covid tested, even though she has had 2 vaccine doses.

## 2020-07-09 NOTE — Telephone Encounter (Signed)
email sent from patient     I been in contact with this, my daughter and grandson has it, we live together, my household is beginning to have slight symptoms,  but for me  I'm having lot of coughing, headache, burring eyes &  nose ,mostly upper respiratory shortness of breath some, wheezing, stuffiness, i there anyway I can get some type of cough medicine for respiratory,  been like this since 07/06/20.  [phone],  [name]   Dr. Maple Hudson please advise  Allergies  Allergen Reactions  . Ketorolac Tromethamine Shortness Of Breath and Palpitations  . Atrovent [Ipratropium] Hives and Rash  . Latex Other (See Comments)    Powdered. Confirm type of reaction with patient.   Current Outpatient Medications on File Prior to Visit  Medication Sig Dispense Refill  . albuterol (VENTOLIN HFA) 108 (90 Base) MCG/ACT inhaler Inhale 1-2 puffs into the lungs every 6 (six) hours as needed for wheezing or shortness of breath. 3 each 0  . diclofenac (VOLTAREN) 75 MG EC tablet Take 1 tablet (75 mg total) by mouth 2 (two) times daily as needed. 40 tablet 1  . gabapentin (NEURONTIN) 300 MG capsule 1 cap am and noon, 2 caps at bedtime. 120 capsule 2  . hydrochlorothiazide (HYDRODIURIL) 25 MG tablet Take 1 tablet (25 mg total) by mouth daily. 90 tablet 1  . HYDROcodone-acetaminophen (NORCO/VICODIN) 5-325 MG tablet TAKE 1 TABLET BY MOUTH 4 TIMES DAILY AS NEEDED FOR PAIN    . lidocaine (LIDODERM) 5 % Place 1 patch onto the skin daily as needed. Apply patch to area most significant pain once per day.  Remove and discard patch within 12 hours of application. 30 patch 0  . lisinopril (ZESTRIL) 40 MG tablet TAKE 1 TABLET BY MOUTH  DAILY 90 tablet 3  . methocarbamol (ROBAXIN) 500 MG tablet Take 1 tablet (500 mg total) by mouth every 8 (eight) hours as needed for muscle spasms. 15 tablet 0  . pantoprazole (PROTONIX) 40 MG tablet Take 1 tablet (40 mg total) by mouth daily as needed (for acid reflux).    . phentermine  (ADIPEX-P) 37.5 MG tablet 0.5 tab daily for 7 days, then every other day,then every 3rd day and discontinue. 15 tablet 0  . Semaglutide,0.25 or 0.5MG /DOS, (OZEMPIC, 0.25 OR 0.5 MG/DOSE,) 2 MG/1.5ML SOPN Start with 0.25 mg weekly x 2 then 0.5 mg weekly and 1 mg . 1.5 mL 2  . tiZANidine (ZANAFLEX) 2 MG tablet Take 2 mg by mouth 3 (three) times daily as needed.    . traMADol (ULTRAM) 50 MG tablet Take 50 mg by mouth 2 (two) times daily as needed. for pain    . [DISCONTINUED] budesonide-formoterol (SYMBICORT) 160-4.5 MCG/ACT inhaler Inhale 2 puffs into the lungs 2 (two) times daily. (Patient not taking: Reported on 02/12/2020) 3 Inhaler 4   No current facility-administered medications on file prior to visit.

## 2020-07-09 NOTE — Discharge Instructions (Signed)
Call your primary care doctor in the next 1-2 days to arrange video follow-up.  Use a finger pulse oximeter at home.  You may purchase one at [facility] or [facility] or online.  If the numbers drops and stays below 90%, return immediately back to the ER.  Otherwise increase your fluid intake, isolate at home for 5 days after symptoms resolve, and inform recent close contacts of the need to test for Covid.  

## 2020-07-09 NOTE — ED Triage Notes (Signed)
Pt reports recent covid exposure, cough runny nose and body aches x2 days. Has been vaccinated.

## 2020-07-09 NOTE — Addendum Note (Signed)
Addended by: Pilar Grammes on: 07/09/2020 12:10 PM   Modules accepted: Orders

## 2020-07-10 ENCOUNTER — Encounter: Payer: Self-pay | Admitting: Family Medicine

## 2020-07-13 NOTE — Telephone Encounter (Signed)
Spoke with the [name] and informed [name] of the message below. [name] declined the appt for a virtual visit tomorrow at 8am with Dr Caryl Never.  Per pts preference an appt was scheduled with Dr Selena Batten tomorrow at 11am.

## 2020-07-14 ENCOUNTER — Telehealth (INDEPENDENT_AMBULATORY_CARE_PROVIDER_SITE_OTHER): Payer: Medicare Other | Admitting: Family Medicine

## 2020-07-14 DIAGNOSIS — J4521 Mild intermittent asthma with (acute) exacerbation: Secondary | ICD-10-CM | POA: Diagnosis not present

## 2020-07-14 DIAGNOSIS — U071 COVID-19: Secondary | ICD-10-CM | POA: Diagnosis not present

## 2020-07-14 MED ORDER — BENZONATATE 100 MG PO CAPS: 100.0000 mg | ORAL_CAPSULE | Freq: Three times a day (TID) | ORAL | 0 refills | Status: DC | PRN

## 2020-07-14 NOTE — Progress Notes (Signed)
Virtual Visit via Video Note  I connected with [name]  on 07/14/20 at 11:00 AM EST by a video enabled telemedicine application and verified that I am speaking with the correct person using two identifiers.  Location patient: home, [place] Location provider:work or home office Persons participating in the virtual visit: patient, provider  I discussed the limitations of evaluation and management by telemedicine and the availability of in person appointments. The patient expressed understanding and agreed to proceed.   HPI:  Acute telemedicine visit for COVID19: -Onset: 07/07/20 -went to the ER on the 30th and tested positive for covid -Symptoms include: nasal congestion, sinus congestion, sinus ha, scratchy throat, intermittent body aches, low grade temp, cough, some wheezing/mild sob - uses alb  Inhaler (not more often than q4-6 hours) and it helps, loss of taste -not worsening, feels better during the day -Denies:CP,  Nvd, inability to get out of bed/eat/drink -Has tried:albuterol, cough syrup -Pertinent past medical history: asthma - mild intermittent, obesity, HTN, depression -Pertinent medication allergies: latex, atrovent, tape, ketorolac -COVID-19 vaccine status: fully vaccinated, had not had booster, vaccinated for flu  ROS: See pertinent positives and negatives per HPI.  Past Medical History:  Diagnosis Date  . Abnormal EKG 06/2003   History of inverted T waves V1-V3. Normal 2D echo (07/23/2003): LVEF 65%.  . Anemia    BL Hgb 11-12. Ferritin 14 - low normal (08/2007). Colonoscopy 2009 - external hemorrhoids (excellent prep). Last anemia panel (12/2010) - Iron  24, TIBC 269,  B12  316, Folate 11.9, Ferritin 73.  . Asthma   . B12 deficiency   . Back pain   . Chest pain   . Degenerative joint disease    BL knees (L>R), lumbar spine. Followed by Sports Medicine, Dr. Nori Riis.  . Dyspnea   . History of multiple pulmonary nodules    Incidental finding: CT Abd/ Pelvis (04/2010) -  Several small lower lobe lung nodules, including one pure ground-glass pulmonary nodule measuring 8 mm in the left lower lobe.  Recommend follow-up chest CT (IV contrast preferred) in 6 months to document stability. //  CT Abd/ Pelvis (07/2010) -  3 mm RLL and  8 mm LLL nodule stable.  Other nodules unchanged, likely benign.  . Hyperlipidemia   . Hypertension   . Incarcerated ventral hernia 04/2010   Noted on CT Abd/ pelvis (04/2010). Patient now s/p ventral hernia repair by Dr. Harlow Asa (12/2010)  . Insomnia   . 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 Left hip pain   . Left ventricular diastolic dysfunction   . Leg edema   . Lower extremity edema    Chronic. 2D echo (2005) - EF 65%.  . Obesity   . OSA (obstructive sleep apnea)   . Palpitations   . Positive D dimer   . Right knee pain     Past Surgical History:  Procedure Laterality Date  . COLONOSCOPY  2009  . COLONOSCOPY WITH PROPOFOL N/A 12/18/2017   Procedure: COLONOSCOPY WITH PROPOFOL;  Surgeon: Gatha Mayer, MD;  Location: [facility];  Service: Endoscopy;  Laterality: N/A;  . FOOT SURGERY  2004    left  . INCISION AND DRAINAGE ABSCESS ANAL  07/2008   I&D and debridgement of anorectal abscess  . INCISIONAL HERNIA REPAIR  12/2010   Repair of incarcerated ventral incisional hernia with Ethicon mesh patch - performed by Dr. Harlow Asa.   . INGUINAL HERNIA REPAIR    . JOINT REPLACEMENT Left  2013   left knee  . TOTAL KNEE ARTHROPLASTY  06/16/2011   Left TOTAL KNEE ARTHROPLASTY;  Surgeon: Kennieth Rad;  Location: [facility] OR;  Service: Orthopedics;  Laterality: Left;  left total knee arthroplasty  . TUBAL LIGATION       Current Outpatient Medications:  .  benzonatate (TESSALON PERLES) 100 MG capsule, Take 1 capsule (100 mg total) by mouth 3 (three) times daily as needed., Disp: 20 capsule, Rfl: 0 .  albuterol (VENTOLIN HFA) 108 (90 Base) MCG/ACT inhaler, Inhale 1-2 puffs into the lungs every 6 (six)  hours as needed for wheezing or shortness of breath. (Patient taking differently: Inhale 1-2 puffs into the lungs every 4 (four) hours as needed for wheezing or shortness of breath.), Disp: 3 each, Rfl: 0 .  diclofenac (VOLTAREN) 75 MG EC tablet, Take 1 tablet (75 mg total) by mouth 2 (two) times daily as needed. (Patient taking differently: Take 75 mg by mouth 2 (two) times daily as needed for mild pain.), Disp: 40 tablet, Rfl: 1 .  gabapentin (NEURONTIN) 300 MG capsule, 1 cap am and noon, 2 caps at bedtime. (Patient taking differently: Take 300-600 mg by mouth See admin instructions. Take 300 mg by mouth in the morning & at noon, and 600 mg at bedtime), Disp: 120 capsule, Rfl: 2 .  hydrochlorothiazide (HYDRODIURIL) 25 MG tablet, Take 1 tablet (25 mg total) by mouth daily. (Patient not taking: Reported on 07/09/2020), Disp: 90 tablet, Rfl: 1 .  lidocaine (LIDODERM) 5 %, Place 1 patch onto the skin daily as needed. Apply patch to area most significant pain once per day.  Remove and discard patch within 12 hours of application. (Patient not taking: Reported on 07/09/2020), Disp: 30 patch, Rfl: 0 .  lisinopril (ZESTRIL) 40 MG tablet, TAKE 1 TABLET BY MOUTH  DAILY (Patient taking differently: Take 40 mg by mouth daily.), Disp: 90 tablet, Rfl: 3 .  methocarbamol (ROBAXIN) 500 MG tablet, Take 1 tablet (500 mg total) by mouth every 8 (eight) hours as needed for muscle spasms., Disp: 15 tablet, Rfl: 0 .  pantoprazole (PROTONIX) 40 MG tablet, Take 1 tablet (40 mg total) by mouth daily as needed (for acid reflux). (Patient not taking: Reported on 07/09/2020), Disp: , Rfl:  .  promethazine-dextromethorphan (PROMETHAZINE-DM) 6.25-15 MG/5ML syrup, Take 5 mLs by mouth 4 (four) times daily as needed for cough., Disp: 118 mL, Rfl: 0 .  Semaglutide,0.25 or 0.5MG /DOS, (OZEMPIC, 0.25 OR 0.5 MG/DOSE,) 2 MG/1.5ML SOPN, Start with 0.25 mg weekly x 2 then 0.5 mg weekly and 1 mg . (Patient taking differently: Inject 0.5 mg into  the skin every Tuesday.), Disp: 1.5 mL, Rfl: 2  EXAM:  VITALS per patient if applicable:  GENERAL: alert, oriented, appears well and in no acute distress  HEENT: atraumatic, conjunttiva clear, no obvious abnormalities on inspection of external nose and ears  NECK: normal movements of the head and neck  LUNGS: on inspection no signs of respiratory distress, breathing rate appears normal, no obvious gross SOB, gasping or wheezing  CV: no obvious cyanosis  MS: moves all visible extremities without noticeable abnormality  PSYCH/NEURO: pleasant and cooperative, no obvious depression or anxiety, speech and thought processing grossly intact  ASSESSMENT AND PLAN:  Discussed the following assessment and plan:  COVID-19  Mild intermittent asthma with acute exacerbation  -we discussed possible serious and likely etiologies, options for evaluation and workup, limitations of telemedicine visit vs in person visit, treatment, treatment risks and precautions. Pt prefers to treat  via telemedicine empirically rather than in person at this moment.  Discussed treatment options, potential complications, isolation and precautions for COVID-19 illness.  Currently her symptoms are mild and she feels that her mild asthma symptoms resolved entirely when she uses her albuterol.  She prefers to avoid prednisone, as reports she and her PCP about working on her weight and she does not want to take steroid.  She feels that she might be improving some.  She is vaccinated.  Opted for continued symptomatic care, albuterol as needed and sent Tessalon for cough.  Recommended close follow-up with primary care doctor and low threshold to seek in person care if any worsening, symptoms not responding to her albuterol, increased albuterol use or other concerns. Scheduled follow up with PCP offered: Sent message to schedulers to assist and advised patient to contact PCP office to schedule if does not receive call back in next 24  hours.  Discussed options for inperson care if PCP office not available. Did let this patient know that I only do telemedicine on Tuesdays and Thursdays for [place]. Advised to schedule follow up visit with PCP or [facility] if any further questions or concerns to avoid delays in care.   I discussed the assessment and treatment plan with the patient. The patient was provided an opportunity to ask questions and all were answered. The patient agreed with the plan and demonstrated an understanding of the instructions.     Terressa Koyanagi, DO

## 2020-07-14 NOTE — Patient Instructions (Addendum)
  HOME CARE TIPS:  -I sent the medication(s) we discussed to your pharmacy: Meds ordered this encounter  Medications  . benzonatate (TESSALON PERLES) 100 MG capsule    Sig: Take 1 capsule (100 mg total) by mouth 3 (three) times daily as needed.    Dispense:  20 capsule    Refill:  0    -can use tylenol if needed for fevers, aches and pains per instructions  -can use nasal saline a few times per day if nasal congestion  -stay hydrated, drink plenty of fluids and eat small healthy meals - avoid dairy  -can take 1000 IU Vit D3 and Vit C lozenges per instructions  -If the Covid test is positive, check out the CDC website for more information on home care, transmission and treatment for COVID19  -follow up with your doctor in 2-3 days unless improving and feeling better  -stay home while sick, except to seek medical care, and if you have COVID19 please stay home for a full 10 days since the onset of symptoms PLUS one day of no fever and feeling better.  It was nice to meet you today, and I really hope you are feeling better soon. I help [facility] out with telemedicine visits on Tuesdays and Thursdays and am available for visits on those days. If you have any concerns or questions following this visit please schedule a follow up visit with your Primary Care doctor or seek care at a local urgent care clinic to avoid delays in care.    Seek in person care promptly if your symptoms worsen, new concerns arise or you are not improving with treatment. Call 911 and/or seek emergency care if you symptoms are severe or life threatening.

## 2020-07-15 NOTE — Progress Notes (Signed)
LMVM for the patient to contact the office to schedule a virtual appointment with Dr. [name] to follow up on COVID 19 illness

## 2020-07-21 ENCOUNTER — Telehealth (INDEPENDENT_AMBULATORY_CARE_PROVIDER_SITE_OTHER): Payer: Medicare Other | Admitting: Family Medicine

## 2020-07-21 ENCOUNTER — Encounter: Payer: Self-pay | Admitting: Family Medicine

## 2020-07-21 VITALS — Ht 65.0 in | Wt 356.0 lb

## 2020-07-21 DIAGNOSIS — U071 COVID-19: Secondary | ICD-10-CM | POA: Diagnosis not present

## 2020-07-21 DIAGNOSIS — J45901 Unspecified asthma with (acute) exacerbation: Secondary | ICD-10-CM | POA: Diagnosis not present

## 2020-07-21 MED ORDER — BUDESONIDE-FORMOTEROL FUMARATE 160-4.5 MCG/ACT IN AERO: 2.0000 | INHALATION_SPRAY | Freq: Two times a day (BID) | RESPIRATORY_TRACT | 4 refills | Status: DC

## 2020-07-21 NOTE — Progress Notes (Signed)
Virtual Visit via Video Note I connected with [name] on 07/21/20 by a video enabled telemedicine application and verified that I am speaking with the correct person using two identifiers.  Location patient: home Location provider:work office Persons participating in the virtual visit: patient, provider  I discussed the limitations of evaluation and management by telemedicine and the availability of in person appointments. The patient expressed understanding and agreed to proceed.  Chief Complaint  Patient presents with  . Covid Positive   HPI: [name] is a 65 year old female with history of asthma, DMII, hypertension, and hyperlipidemia following on recent diagnosis of COVID-19 infection. She was evaluated in the ER on 07/09/2020 diagnosed with COVID-19 infection. CXR:Low lung volumes without evidence of acute cardiopulmonary disease  Lab Results  Component Value Date   WBC 5.0 07/09/2020   HGB 12.7 07/09/2020   HCT 41.4 07/09/2020   MCV 90.8 07/09/2020   PLT 313 07/09/2020   Lab Results  Component Value Date   CREATININE 0.80 07/09/2020   BUN 8 07/09/2020   NA 140 07/09/2020   K 3.8 07/09/2020   CL 108 07/09/2020   CO2 22 07/09/2020   Follow-up on 07/14/2020 with Dr. Selena BattenKim. Most symptoms have resolved. Still having productive cough with thin sputum, sometimes some blood in the mucus, which was worse at the beginning of the infection. + Nasal congestion, rhinorrhea, and postnasal drainage.  Wheezing and dyspnea with exertion. She is on albuterol inhaler, usually 2-3 times per day as needed. She is no longer on Symbicort. Follows with pulmonologist, she saw Dr. Maple HudsonYoung about 2 to 3 weeks ago.  Negative for fever, sore throat, CP, palpitations, abdominal pain, nausea, vomiting, changes in bowel habits, urinary symptoms, or skin rash. Still having anosmia and ageusia + decreased appetite.  She lost some more weight during illness. She is on Ozempic 1 mg weekly. She has an  appointment later this month with bariatric surgeon to discussed procedure. Tolerating medication well.  ROS: See pertinent positives and negatives per HPI.  Past Medical History:  Diagnosis Date  . Abnormal EKG 06/2003   History of inverted T waves V1-V3. Normal 2D echo (07/23/2003): LVEF 65%.  . Anemia    BL Hgb 11-12. Ferritin 14 - low normal (08/2007). Colonoscopy 2009 - external hemorrhoids (excellent prep). Last anemia panel (12/2010) - Iron  24, TIBC 269,  B12  316, Folate 11.9, Ferritin 73.  . Asthma   . B12 deficiency   . Back pain   . Chest pain   . Degenerative joint disease    BL knees (L>R), lumbar spine. Followed by Sports Medicine, Dr. Jennette KettleNeal.  . Dyspnea   . History of multiple pulmonary nodules    Incidental finding: CT Abd/ Pelvis (04/2010) - Several small lower lobe lung nodules, including one pure ground-glass pulmonary nodule measuring 8 mm in the left lower lobe.  Recommend follow-up chest CT (IV contrast preferred) in 6 months to document stability. //  CT Abd/ Pelvis (07/2010) -  3 mm RLL and  8 mm LLL nodule stable.  Other nodules unchanged, likely benign.  . Hyperlipidemia   . Hypertension   . Incarcerated ventral hernia 04/2010   Noted on CT Abd/ pelvis (04/2010). Patient now s/p ventral hernia repair by Dr. Gerrit FriendsGerkin (12/2010)  . Insomnia   . 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 Left hip pain   . Left ventricular diastolic dysfunction   . Leg edema   . Lower extremity  edema    Chronic. 2D echo (2005) - EF 65%.  . Obesity   . OSA (obstructive sleep apnea)   . Palpitations   . Positive D dimer   . Right knee pain     Past Surgical History:  Procedure Laterality Date  . COLONOSCOPY  2009  . COLONOSCOPY WITH PROPOFOL N/A 12/18/2017   Procedure: COLONOSCOPY WITH PROPOFOL;  Surgeon: Iva Boop, MD;  Location: [facility];  Service: Endoscopy;  Laterality: N/A;  . FOOT SURGERY  2004    left  . INCISION AND DRAINAGE  ABSCESS ANAL  07/2008   I&D and debridgement of anorectal abscess  . INCISIONAL HERNIA REPAIR  12/2010   Repair of incarcerated ventral incisional hernia with Ethicon mesh patch - performed by Dr. Gerrit Friends.   . INGUINAL HERNIA REPAIR    . JOINT REPLACEMENT Left 2013   left knee  . TOTAL KNEE ARTHROPLASTY  06/16/2011   Left TOTAL KNEE ARTHROPLASTY;  Surgeon: Kennieth Rad;  Location: [facility];  Service: Orthopedics;  Laterality: Left;  left total knee arthroplasty  . TUBAL LIGATION      Family History  Problem Relation Age of Onset  . Diabetes Mother   . Hypertension Mother   . Stroke Mother   . Hyperlipidemia Mother   . Heart disease Mother   . Sudden death Mother   . Kidney disease Mother   . Depression Mother   . Dementia Father   . Heart disease Father   . Hyperlipidemia Father   . Sudden death Father   . Depression Father   . Hypertension Sister   . Diabetes Brother   . Hypertension Brother   . Rashes / Skin problems Maternal Grandfather   . Heart attack Neg Hx     Social History   Socioeconomic History  . Marital status: Married    Spouse name: Gerarda Fraction  . Number of children: 3  . Years of education: Not on file  . Highest education level: Not on file  Occupational History  . Occupation: Stay at home  Tobacco Use  . Smoking status: Former Smoker    Packs/day: 0.50    Years: 20.00    Pack years: 10.00    Types: Cigarettes    Quit date: 09/10/1998    Years since quitting: 21.8  . Smokeless tobacco: Never Used  . Tobacco comment: 65 yo   Vaping Use  . Vaping Use: Never used  Substance and Sexual Activity  . Alcohol use: No    Alcohol/week: 0.0 standard drinks  . Drug use: No  . Sexual activity: Yes    Partners: Male  Other Topics Concern  . Not on file  Social History Narrative   10/05/2018: Lives with husband, daughter, and 2 grandchildren in [place] house. Lives on main level though mostly.   Has three children altogether, all local, supportive   Currently  getting ramp installed in house d/t pt difficulty getting upstairs.      Social Determinants of Health   Financial Resource Strain: Low Risk   . Difficulty of Paying Living Expenses: Not hard at all  Food Insecurity: No Food Insecurity  . Worried About Programme researcher, broadcasting/film/video in the Last Year: Never true  . Ran Out of Food in the Last Year: Never true  Transportation Needs: No Transportation Needs  . Lack of Transportation (Medical): No  . Lack of Transportation (Non-Medical): No  Physical Activity: Inactive  . Days of Exercise per Week: 0 days  .  Minutes of Exercise per Session: 0 min  Stress: No Stress Concern Present  . Feeling of Stress : Not at all  Social Connections: Moderately Integrated  . Frequency of Communication with Friends and Family: More than three times a week  . Frequency of Social Gatherings with Friends and Family: Never  . Attends Religious Services: 1 to 4 times per year  . Active Member of Clubs or Organizations: No  . Attends Banker Meetings: Never  . Marital Status: Married  Catering manager Violence: Not At Risk  . Fear of Current or Ex-Partner: No  . Emotionally Abused: No  . Physically Abused: No  . Sexually Abused: No    Current Outpatient Medications:  .  albuterol (VENTOLIN HFA) 108 (90 Base) MCG/ACT inhaler, Inhale 1-2 puffs into the lungs every 6 (six) hours as needed for wheezing or shortness of breath. (Patient taking differently: Inhale 1-2 puffs into the lungs every 4 (four) hours as needed for wheezing or shortness of breath.), Disp: 3 each, Rfl: 0 .  benzonatate (TESSALON PERLES) 100 MG capsule, Take 1 capsule (100 mg total) by mouth 3 (three) times daily as needed., Disp: 20 capsule, Rfl: 0 .  diclofenac (VOLTAREN) 75 MG EC tablet, Take 1 tablet (75 mg total) by mouth 2 (two) times daily as needed. (Patient taking differently: Take 75 mg by mouth 2 (two) times daily as needed for mild pain.), Disp: 40 tablet, Rfl: 1 .  gabapentin  (NEURONTIN) 300 MG capsule, 1 cap am and noon, 2 caps at bedtime. (Patient taking differently: Take 300-600 mg by mouth See admin instructions. Take 300 mg by mouth in the morning & at noon, and 600 mg at bedtime), Disp: 120 capsule, Rfl: 2 .  hydrochlorothiazide (HYDRODIURIL) 25 MG tablet, Take 1 tablet (25 mg total) by mouth daily., Disp: 90 tablet, Rfl: 1 .  lidocaine (LIDODERM) 5 %, Place 1 patch onto the skin daily as needed. Apply patch to area most significant pain once per day.  Remove and discard patch within 12 hours of application. (Patient taking differently: Place 1 patch onto the skin daily as needed. Apply patch to area most significant pain once per day.  Remove and discard patch within 12 hours of application.), Disp: 30 patch, Rfl: 0 .  lisinopril (ZESTRIL) 40 MG tablet, TAKE 1 TABLET BY MOUTH  DAILY (Patient taking differently: Take 40 mg by mouth daily.), Disp: 90 tablet, Rfl: 3 .  methocarbamol (ROBAXIN) 500 MG tablet, Take 1 tablet (500 mg total) by mouth every 8 (eight) hours as needed for muscle spasms., Disp: 15 tablet, Rfl: 0 .  pantoprazole (PROTONIX) 40 MG tablet, Take 1 tablet (40 mg total) by mouth daily as needed (for acid reflux)., Disp: , Rfl:  .  promethazine-dextromethorphan (PROMETHAZINE-DM) 6.25-15 MG/5ML syrup, Take 5 mLs by mouth 4 (four) times daily as needed for cough., Disp: 118 mL, Rfl: 0 .  Semaglutide,0.25 or 0.5MG /DOS, (OZEMPIC, 0.25 OR 0.5 MG/DOSE,) 2 MG/1.5ML SOPN, Start with 0.25 mg weekly x 2 then 0.5 mg weekly and 1 mg . (Patient taking differently: Inject 0.5 mg into the skin every Tuesday.), Disp: 1.5 mL, Rfl: 2 .  budesonide-formoterol (SYMBICORT) 160-4.5 MCG/ACT inhaler, Inhale 2 puffs into the lungs 2 (two) times daily., Disp: 3 each, Rfl: 4  EXAM:  VITALS per patient if applicable:Ht 5\' 5"  (1.651 m)   Wt (!) 356 lb (161.5 kg)   LMP 09/09/2009   BMI 59.24 kg/m   Wt Readings from Last 3 Encounters:  07/21/20 (!) 356 lb (161.5 kg)  07/09/20  (!) 356 lb (161.5 kg)  06/23/20 (!) 356 lb (161.5 kg)   GENERAL: alert, oriented, appears well and in no acute distress  HEENT: atraumatic, conjunctiva clear, no obvious abnormalities on inspection of external nose and ears  NECK: normal movements of the head and neck  LUNGS: on inspection no signs of respiratory distress, breathing rate appears normal, no obvious gross SOB, gasping or wheezing  CV: no obvious cyanosis  PSYCH/NEURO: pleasant and cooperative, no obvious depression or anxiety, speech and thought processing grossly intact  ASSESSMENT AND PLAN:  Discussed the following assessment and plan:  Persistent asthma with acute exacerbation, unspecified asthma severity - Plan: budesonide-formoterol (SYMBICORT) 160-4.5 MCG/ACT inhaler Continue albuterol inhaler 2 puff every 4-6 hours daily as needed. She agrees with resuming Symbicort 160-4.5 mcg 2 puff twice daily.  We discussed some side effects, instructed to swish  mouth after use. Instructed about warning signs.  COVID-19 virus infection Acute episode has resolved. Still having some residual symptoms.  Explained that cough and nasal congestion may last a few more weeks. Plain Mucinex may help. Monitor for fever and/or worsening symptoms.  Morbid obesity (HCC) Encouraged to continue following a healthful diet. Regular physical activity is difficult due to knee OA. Continue Ozempic 1 mg weekly. Keep appointment with bariatric surgeon.  I discussed the assessment and treatment plan with the patient. The patient was provided an opportunity to ask questions and all were answered. The patient agreed with the plan and demonstrated an understanding of the instructions.   Return if symptoms worsen or fail to improve, for Keep next f/u appt.  Nattalie Santiesteban [name], MD

## 2020-07-22 ENCOUNTER — Ambulatory Visit: Payer: Medicare Other

## 2020-07-22 ENCOUNTER — Other Ambulatory Visit: Payer: Self-pay

## 2020-07-22 DIAGNOSIS — G4733 Obstructive sleep apnea (adult) (pediatric): Secondary | ICD-10-CM

## 2020-07-23 DIAGNOSIS — R7303 Prediabetes: Secondary | ICD-10-CM | POA: Diagnosis not present

## 2020-07-23 DIAGNOSIS — G4733 Obstructive sleep apnea (adult) (pediatric): Secondary | ICD-10-CM | POA: Diagnosis not present

## 2020-07-23 DIAGNOSIS — I10 Essential (primary) hypertension: Secondary | ICD-10-CM | POA: Diagnosis not present

## 2020-07-23 DIAGNOSIS — E785 Hyperlipidemia, unspecified: Secondary | ICD-10-CM | POA: Diagnosis not present

## 2020-07-29 ENCOUNTER — Other Ambulatory Visit (HOSPITAL_COMMUNITY): Payer: Self-pay | Admitting: Surgery

## 2020-07-29 NOTE — Progress Notes (Signed)
Cardiology Office Note:    Date:  07/29/2020   ID:  Holly LauberAngela Webster Stuckert, DOB [birth year], MRN [id number]  PCP:  SwazilandJordan, [name], MD  Cardiologist:  Armanda Magicraci Turner, MD  Electrophysiologist:  None   Referring MD: Quentin OreStechschulte, Paul J, MD   Chief Complaint/Reason for Referral: Preoperative risk assessment.  History of Present Illness:    [name] is a 65 y.o. female with a history of HTN, morbid obesity, HLD, prediabetes, asthma. Presents for preoperative risk stratification/optimization prior to bariatric surgery.  Preoperative cardiovascular evaluation Pertinent past cardiac history: no Prior cardiac workup: nuc normal 2017, echo normal 2019.  History of valve disease: no History of CAD/PAD/CVA/TIA: no History of heart failure: no  History of arrhythmia: no On anticoagulation: no  Additional history (hypertension, diabetes/on insulin, CKD/current creatinine, OSA, anesthesia complications): has OSA, repeat sleep test pending Current symptoms: none Functional capacity: <4 METS  Past Medical History:  Diagnosis Date  . Abnormal EKG 06/2003   History of inverted T waves V1-V3. Normal 2D echo (07/23/2003): LVEF 65%.  . Anemia    BL Hgb 11-12. Ferritin 14 - low normal (08/2007). Colonoscopy 2009 - external hemorrhoids (excellent prep). Last anemia panel (12/2010) - Iron  24, TIBC 269,  B12  316, Folate 11.9, Ferritin 73.  . Asthma   . B12 deficiency   . Back pain   . Chest pain   . Degenerative joint disease    BL knees (L>R), lumbar spine. Followed by Sports Medicine, Dr. Jennette KettleNeal.  . Dyspnea   . History of multiple pulmonary nodules    Incidental finding: CT Abd/ Pelvis (04/2010) - Several small lower lobe lung nodules, including one pure ground-glass pulmonary nodule measuring 8 mm in the left lower lobe.  Recommend follow-up chest CT (IV contrast preferred) in 6 months to document stability. //  CT Abd/ Pelvis (07/2010) -  3 mm RLL and  8 mm LLL nodule stable.  Other  nodules unchanged, likely benign.  . Hyperlipidemia   . Hypertension   . Incarcerated ventral hernia 04/2010   Noted on CT Abd/ pelvis (04/2010). Patient now s/p ventral hernia repair by Dr. Gerrit FriendsGerkin (12/2010)  . Insomnia   . 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 Left hip pain   . Left ventricular diastolic dysfunction   . Leg edema   . Lower extremity edema    Chronic. 2D echo (2005) - EF 65%.  . Obesity   . OSA (obstructive sleep apnea)   . Palpitations   . Positive D dimer   . Right knee pain     Past Surgical History:  Procedure Laterality Date  . COLONOSCOPY  2009  . COLONOSCOPY WITH PROPOFOL N/A 12/18/2017   Procedure: COLONOSCOPY WITH PROPOFOL;  Surgeon: Iva BoopGessner, Carl E, MD;  Location: [facility];  Service: Endoscopy;  Laterality: N/A;  . FOOT SURGERY  2004    left  . INCISION AND DRAINAGE ABSCESS ANAL  07/2008   I&D and debridgement of anorectal abscess  . INCISIONAL HERNIA REPAIR  12/2010   Repair of incarcerated ventral incisional hernia with Ethicon mesh patch - performed by Dr. Gerrit FriendsGerkin.   . INGUINAL HERNIA REPAIR    . JOINT REPLACEMENT Left 2013   left knee  . TOTAL KNEE ARTHROPLASTY  06/16/2011   Left TOTAL KNEE ARTHROPLASTY;  Surgeon: Kennieth RadArthur F Carter;  Location: [facility];  Service: Orthopedics;  Laterality: Left;  left total knee arthroplasty  . TUBAL LIGATION  Current Medications: Current Meds  Medication Sig  . albuterol (VENTOLIN HFA) 108 (90 Base) MCG/ACT inhaler Inhale 1-2 puffs into the lungs every 6 (six) hours as needed for wheezing or shortness of breath.  . budesonide-formoterol (SYMBICORT) 160-4.5 MCG/ACT inhaler Inhale 2 puffs into the lungs 2 (two) times daily.  . diclofenac (VOLTAREN) 75 MG EC tablet Take 1 tablet (75 mg total) by mouth 2 (two) times daily as needed.  . gabapentin (NEURONTIN) 300 MG capsule 1 cap am and noon, 2 caps at bedtime.  . lidocaine (LIDODERM) 5 % Place 1 patch onto the skin daily as needed.  Apply patch to area most significant pain once per day.  Remove and discard patch within 12 hours of application. (Patient taking differently: Place 1 patch onto the skin daily as needed. Apply patch to area most significant pain once per day.  Remove and discard patch within 12 hours of application.)  . methocarbamol (ROBAXIN) 500 MG tablet Take 1 tablet (500 mg total) by mouth every 8 (eight) hours as needed for muscle spasms.  . rosuvastatin (CRESTOR) 5 MG tablet Take 1 tablet (5 mg total) by mouth daily.  . [DISCONTINUED] hydrochlorothiazide (HYDRODIURIL) 25 MG tablet Take 1 tablet (25 mg total) by mouth daily.  . [DISCONTINUED] lisinopril (ZESTRIL) 40 MG tablet TAKE 1 TABLET BY MOUTH  DAILY  . [DISCONTINUED] Semaglutide,0.25 or 0.5MG /DOS, (OZEMPIC, 0.25 OR 0.5 MG/DOSE,) 2 MG/1.5ML SOPN Start with 0.25 mg weekly x 2 then 0.5 mg weekly and 1 mg .     Allergies:   Ketorolac tromethamine, Atrovent [ipratropium], Latex, and Tape   Social History   Tobacco Use  . Smoking status: Former Smoker    Packs/day: 0.50    Years: 20.00    Pack years: 10.00    Types: Cigarettes    Quit date: 09/10/1998    Years since quitting: 21.8  . Smokeless tobacco: Never Used  . Tobacco comment: 65 yo   Vaping Use  . Vaping Use: Never used  Substance Use Topics  . Alcohol use: No    Alcohol/week: 0.0 standard drinks  . Drug use: No     Family History: The patient's family history includes Dementia in her father; Depression in her father and mother; Diabetes in her brother and mother; Heart disease in her father and mother; Hyperlipidemia in her father and mother; Hypertension in her brother, mother, and sister; Kidney disease in her mother; Rashes / Skin problems in her maternal grandfather; Stroke in her mother; Sudden death in her father and mother. There is no history of Heart attack.  ROS:   Please see the history of present illness.    All other systems reviewed and are negative.  EKGs/Labs/Other  Studies Reviewed:    The following studies were reviewed today:  EKG:  NSR, nonspecific T wave abnl    Recent Labs: 07/09/2020: ALT 8; BUN 8; Creatinine, Ser 0.80; Hemoglobin 12.7; Platelets 313; Potassium 3.8; Sodium 140  Recent Lipid Panel    Component Value Date/Time   CHOL 202 (H) 12/02/2019 0913   CHOL 195 08/16/2018 1207   TRIG 87.0 12/02/2019 0913   HDL 39.40 12/02/2019 0913   HDL 45 08/16/2018 1207   CHOLHDL 5 12/02/2019 0913   VLDL 17.4 12/02/2019 0913   LDLCALC 145 (H) 12/02/2019 0913   LDLCALC 133 (H) 08/16/2018 1207    Physical Exam:    VS:  BP (!) 150/68   Pulse 77   Ht 5\' 5"  (1.651 m)   Wt )  358 lb 9.6 oz (162.7 kg)   LMP 09/09/2009   SpO2 95%   BMI 59.67 kg/m     Wt Readings from Last 5 Encounters:  07/21/20 (!) 356 lb (161.5 kg)  07/09/20 (!) 356 lb (161.5 kg)  06/23/20 (!) 356 lb (161.5 kg)  04/27/20 (!) 359 lb (162.8 kg)  02/19/20 (!) 367 lb 1.1 oz (166.5 kg)    Constitutional: No acute distress Eyes: sclera non-icteric, normal conjunctiva and lids ENMT: normal dentition, moist mucous membranes Cardiovascular: regular rhythm, normal rate, no murmurs. S1 and S2 normal. Radial pulses normal bilaterally. No jugular venous distention.  Respiratory: clear to auscultation bilaterally GI : normal bowel sounds, soft and nontender. No distention.   MSK: extremities warm, well perfused. No edema.  NEURO: grossly nonfocal exam, moves all extremities. PSYCH: alert and oriented x 3, normal mood and affect.   ASSESSMENT:    1. Preoperative cardiovascular examination   2. Hyperlipidemia, unspecified hyperlipidemia type   3. Pre-op evaluation   4. Primary hypertension   5. Obstructive sleep apnea    PLAN:    Preoperative cardiovascular examination She is asymptomatic and has had normal cardiovascular testing in last 5 years. We reviewed risk categorization as below.   The patient is intermediate risk for intermediate risk procedure.  No further  cardiovascular testing is required prior to the procedure.  If this level of risk is acceptable to the patient and surgical team, the patient should be considered optimized from a cardiovascular standpoint.   Hyperlipidemia, unspecified hyperlipidemia type - Plan: EKG 12-Lead, Lipid panel, Comprehensive metabolic panel - we need to initiate statin therapy for hyperlipidemia, will start crestor 5 mg daily and recheck labs in 3 mo.   HTN - BP elevated today, on HCTZ and lisinopril. Consider adding spironolactone if labs amenable, and BP elevated at next follow up.   Total time of encounter: 60 minutes total time of encounter, including 35 minutes spent in face-to-face patient care on the date of this encounter. This time includes coordination of care and counseling regarding above mentioned problem list. Remainder of non-face-to-face time involved reviewing chart documents/testing relevant to the patient encounter and documentation in the medical record. I have independently reviewed documentation from referring provider.   Weston Brass, MD [place]  [facility]    Medication Adjustments/Labs and Tests Ordered: Current medicines are reviewed at length with the patient today.  Concerns regarding medicines are outlined above.   Orders Placed This Encounter  Procedures  . Lipid panel  . Comprehensive metabolic panel  . EKG 12-Lead    Meds ordered this encounter  Medications  . rosuvastatin (CRESTOR) 5 MG tablet    Sig: Take 1 tablet (5 mg total) by mouth daily.    Dispense:  90 tablet    Refill:  1    Patient Instructions  Medication Instructions:  START: CRESTOR 5 MG (1 TABLET) DAILY  *If you need a refill on your cardiac medications before your next appointment, please call your pharmacy*  Lab Work: PLEASE RETURN IN 3 MONTHS FOR REPEAT BLOOD WORK TO CHECK CHOLESTEROL  If you have labs (blood work) drawn today and your tests are completely normal, you will receive your  results only by: [name] MyChart Message (if you have MyChart) OR . A paper copy in the mail If you have any lab test that is abnormal or we need to change your treatment, we will call you to review the results.  Follow-Up: At [facility], you and your  health needs are our priority.  As part of our continuing mission to provide you with exceptional heart care, we have created designated Provider Care Teams.  These Care Teams include your primary Cardiologist (physician) and Advanced Practice Providers (APPs -  Physician Assistants and Nurse Practitioners) who all work together to provide you with the care you need, when you need it.    Your next appointment:   3 month(s)  The format for your next appointment:   In Person  Provider:   Weston Brass, MD

## 2020-07-30 ENCOUNTER — Ambulatory Visit (INDEPENDENT_AMBULATORY_CARE_PROVIDER_SITE_OTHER): Payer: Medicare Other | Admitting: Internal Medicine

## 2020-07-30 ENCOUNTER — Encounter: Payer: Self-pay | Admitting: Internal Medicine

## 2020-07-30 ENCOUNTER — Other Ambulatory Visit: Payer: Self-pay

## 2020-07-30 VITALS — BP 150/68 | HR 77 | Ht 65.0 in | Wt 358.6 lb

## 2020-07-30 DIAGNOSIS — E785 Hyperlipidemia, unspecified: Secondary | ICD-10-CM

## 2020-07-30 DIAGNOSIS — I10 Essential (primary) hypertension: Secondary | ICD-10-CM | POA: Diagnosis not present

## 2020-07-30 DIAGNOSIS — G4733 Obstructive sleep apnea (adult) (pediatric): Secondary | ICD-10-CM

## 2020-07-30 DIAGNOSIS — Z01810 Encounter for preprocedural cardiovascular examination: Secondary | ICD-10-CM | POA: Diagnosis not present

## 2020-07-30 DIAGNOSIS — Z01818 Encounter for other preprocedural examination: Secondary | ICD-10-CM

## 2020-07-30 MED ORDER — ROSUVASTATIN CALCIUM 5 MG PO TABS
5.0000 mg | ORAL_TABLET | Freq: Every day | ORAL | 1 refills | Status: DC
Start: 2020-07-30 — End: 2020-11-02

## 2020-07-30 NOTE — Patient Instructions (Signed)
Medication Instructions:  START: CRESTOR 5 MG (1 TABLET) DAILY  *If you need a refill on your cardiac medications before your next appointment, please call your pharmacy*  Lab Work: PLEASE RETURN IN 3 MONTHS FOR REPEAT BLOOD WORK TO CHECK CHOLESTEROL  If you have labs (blood work) drawn today and your tests are completely normal, you will receive your results only by: [name] MyChart Message (if you have MyChart) OR . A paper copy in the mail If you have any lab test that is abnormal or we need to change your treatment, we will call you to review the results.  Follow-Up: At [facility], you and your health needs are our priority.  As part of our continuing mission to provide you with exceptional heart care, we have created designated Provider Care Teams.  These Care Teams include your primary Cardiologist (physician) and Advanced Practice Providers (APPs -  Physician Assistants and Nurse Practitioners) who all work together to provide you with the care you need, when you need it.    Your next appointment:   3 month(s)  The format for your next appointment:   In Person  Provider:   Weston Brass, MD

## 2020-07-31 DIAGNOSIS — G4733 Obstructive sleep apnea (adult) (pediatric): Secondary | ICD-10-CM

## 2020-07-31 NOTE — Telephone Encounter (Signed)
Dr. Maple Hudson, The patient is calling regarding her HST results from 07/22/20.  Please advise.  Thank you.

## 2020-08-03 NOTE — Telephone Encounter (Signed)
Home sleep test this time again shows obstructive sleep apnea, averaging 13- 14 apneas/ hour. In 2018 score was 9/ hr, so this is a little worse.  I do recommend we order DME, new CPAP auto 4-15, mask of choice, humidifier, supplies, Airview/ card.     (She was noncompliant before. She go back to same or different DME. She needs mask fitting and will probably want to try a nasal or nasal pillows mask for less skin contact.)  She will need f/u ov here in 31-90 days, per insurance rules.

## 2020-08-06 ENCOUNTER — Ambulatory Visit (HOSPITAL_COMMUNITY)
Admission: RE | Admit: 2020-08-06 | Discharge: 2020-08-06 | Disposition: A | Discharge status: Home / Self Care | Payer: Medicare Other | Source: Ambulatory Visit | Attending: Surgery | Admitting: Surgery

## 2020-08-06 ENCOUNTER — Other Ambulatory Visit: Payer: Self-pay

## 2020-08-06 DIAGNOSIS — E785 Hyperlipidemia, unspecified: Secondary | ICD-10-CM | POA: Diagnosis not present

## 2020-08-06 DIAGNOSIS — G4733 Obstructive sleep apnea (adult) (pediatric): Secondary | ICD-10-CM | POA: Diagnosis not present

## 2020-08-06 DIAGNOSIS — R7303 Prediabetes: Secondary | ICD-10-CM | POA: Diagnosis not present

## 2020-08-06 DIAGNOSIS — I700 Atherosclerosis of aorta: Secondary | ICD-10-CM | POA: Diagnosis not present

## 2020-08-24 ENCOUNTER — Other Ambulatory Visit: Payer: Self-pay

## 2020-08-24 ENCOUNTER — Encounter: Payer: Self-pay | Admitting: Skilled Nursing Facility1

## 2020-08-24 ENCOUNTER — Encounter: Payer: Medicare Other | Attending: Surgery | Admitting: Skilled Nursing Facility1

## 2020-08-24 DIAGNOSIS — R7303 Prediabetes: Secondary | ICD-10-CM | POA: Insufficient documentation

## 2020-08-24 DIAGNOSIS — E7800 Pure hypercholesterolemia, unspecified: Secondary | ICD-10-CM | POA: Insufficient documentation

## 2020-08-24 DIAGNOSIS — I10 Essential (primary) hypertension: Secondary | ICD-10-CM | POA: Insufficient documentation

## 2020-08-24 DIAGNOSIS — Z79899 Other long term (current) drug therapy: Secondary | ICD-10-CM | POA: Insufficient documentation

## 2020-08-24 DIAGNOSIS — J45909 Unspecified asthma, uncomplicated: Secondary | ICD-10-CM | POA: Insufficient documentation

## 2020-08-24 DIAGNOSIS — G4730 Sleep apnea, unspecified: Secondary | ICD-10-CM | POA: Insufficient documentation

## 2020-08-24 DIAGNOSIS — Z6841 Body Mass Index (BMI) 40.0 and over, adult: Secondary | ICD-10-CM | POA: Insufficient documentation

## 2020-08-24 NOTE — Progress Notes (Signed)
Nutrition Assessment for Bariatric Surgery Medical Nutrition Therapy Appt Start Time:  9:32 End Time: 10:32  Patient was seen on 08/24/2020 for Pre-Operative Nutrition Assessment. Letter of approval faxed to [facility] bariatric surgery program coordinator on 08/24/2020  Referral stated Supervised Weight Loss (SWL) visits needed: 6  Planned surgery: sleeve gastrectomy  Pt expectation of surgery: getting a better life  Pt expectation of dietitian: coaching     NUTRITION ASSESSMENT   Anthropometrics  Start weight at [facility]: 352.6 lbs (date: 08/24/2020)  Height: 65 in BMI: 58.68 kg/m2     Clinical  Medical hx: Prediabetes, sleep apnea Medications: ozempic Labs: vitamin B12 (97)-8 months ago, vitamin D (12.29)-8 months ago Notable signs/symptoms: limited mobility due to hip pain Any previous deficiencies? Yes, vitamin B12 (97)-8 months ago, vitamin D (12.29)-8 months ago  Micronutrient Nutrition Focused Physical Exam: Hair: No issues observed Eyes: No issues observed Mouth: No issues observed Neck: No issues observed Nails: No issues observed Skin: No issues observed  Lifestyle & Dietary Hx  Pt arrives with the use of a wheel chair due to hip pain.  Pt states her husband is supportive of her journey.  Pt states she is not able to stand for long periods of time so she does not prepare her own meals.  Pt states she lives with her husband and her daughter. Pt states she has been working on eating more vegetables and reducing fried foods.   24-Hr Dietary Recall First Meal 9am-10 : cereal  Snack: fruit Second Meal 2-3 : salad + fruit Snack: peanut butter crackers Third Meal: pork chop + grits + eggs Snack: ice cream Beverages: coffee + sugar, water, orange juice   Estimated Energy Needs Calories: 1600   NUTRITION DIAGNOSIS  Overweight/obesity ([place]-3.3) related to past poor dietary habits and physical inactivity as evidenced by patient w/ planned sleeve  gastrectomy surgery following dietary guidelines for continued weight loss.    NUTRITION INTERVENTION  Nutrition counseling (C-1) and education (E-2) to facilitate bariatric surgery goals.   Pre-Op Goals Reviewed with the Patient . Track food and beverage intake (pen and paper, MyFitness Pal, Baritastic app, etc.) . Make healthy food choices while monitoring portion sizes . Consume 3 meals per day or try to eat every 3-5 hours . Avoid concentrated sugars and fried foods . Keep sugar & fat in the single digits per serving on food labels . Practice CHEWING your food (aim for applesauce consistency) . Practice not drinking 15 minutes before, during, and 30 minutes after each meal and snack . Avoid all carbonated beverages (ex: soda, sparkling beverages)  . Limit caffeinated beverages (ex: coffee, tea, energy drinks) . Avoid all sugar-sweetened beverages (ex: regular soda, sports drinks)  . Avoid alcohol  . Aim for 64-100 ounces of FLUID daily (with at least half of fluid intake being plain water)  . Aim for at least 60-80 grams of PROTEIN daily . Look for a liquid protein source that contains ?15 g protein and ?5 g carbohydrate (ex: shakes, drinks, shots) . Make a list of non-food related activities . Physical activity is an important part of a healthy lifestyle so keep it moving! The goal is to reach 150 minutes of exercise per week, including cardiovascular and weight baring activity.  *Goals that are bolded indicate the pt would like to start working towards these  Handouts Provided Include  . Bariatric Surgery handouts (Nutrition Visits, Pre-Op Goals, Protein Shakes, Vitamins & Minerals)  Learning Style & Readiness for Change Teaching  method utilized: Special educational needs teacher  Demonstrated degree of understanding via: Teach Back  Readiness Level: pre-contemplative  Barriers to learning/adherence to lifestyle change: unidentified at this time  RD's Notes for Next Visit . Assess pts  adherence to chosen goals     MONITORING & EVALUATION Dietary intake, weekly physical activity, body weight, and pre-op goals reached at next nutrition visit.    Next Steps  Patient is to follow up at [facility] for Pre-Op Class >2 weeks before surgery for further nutrition education.

## 2020-08-26 ENCOUNTER — Telehealth: Payer: Self-pay | Admitting: Internal Medicine

## 2020-08-26 DIAGNOSIS — G4733 Obstructive sleep apnea (adult) (pediatric): Secondary | ICD-10-CM

## 2020-08-26 NOTE — Telephone Encounter (Signed)
Please see response from Dr. Maple Hudson.  Elisha Headland, FNP

## 2020-08-26 NOTE — Telephone Encounter (Signed)
Dr. Maple Hudson,  Please see message from [facility] below.  Please advise if you are agreeable to placing order for mask fitting.  Elisha Headland, FNP

## 2020-08-26 NOTE — Telephone Encounter (Signed)
Yes- please order mask fitting Thanks- cdy

## 2020-08-26 NOTE — Telephone Encounter (Signed)
Order has been placed.

## 2020-08-26 NOTE — Telephone Encounter (Signed)
Order was sent to [facility] for new cpap yesterday.  I received message back from [place] that pt is not eligible for new cpap due to she just got one on 04/12/17 & can received another one 04/12/22.  They called pt & explained to her.  Pt stated she needs to have a mask fitting because she is allergic to current mask.  Brad asked that we let CY know the above & asked that we put in order for mask fitting.

## 2020-09-01 ENCOUNTER — Other Ambulatory Visit: Payer: Self-pay | Admitting: Family Medicine

## 2020-09-01 DIAGNOSIS — R7303 Prediabetes: Secondary | ICD-10-CM

## 2020-09-04 ENCOUNTER — Ambulatory Visit (HOSPITAL_BASED_OUTPATIENT_CLINIC_OR_DEPARTMENT_OTHER): Payer: Medicare Other | Attending: Internal Medicine | Admitting: Internal Medicine

## 2020-09-04 DIAGNOSIS — G4733 Obstructive sleep apnea (adult) (pediatric): Secondary | ICD-10-CM

## 2020-09-04 NOTE — Telephone Encounter (Signed)
Dr. Maple Hudson patient had her mask fitting today at 1pm and sent the following message. As of right now the results are not in Epic.  Good afternoon,  I just had my mask fitting, but will need new machine, can Dr. Maple Hudson put order in for it please, thank you, they will be sending information from fitting today. I talk with [facility] and they can replace it, cause of damages, no order has been placed or claim. You can check the notes from fitting please   I can try to place another order for a new machine or call [facility] to see what they say.  The last message from [employer] is:  Patient is not eligible for a new cpap machine. She received her current one 04/12/2017 thru [facility] Medicare, so she is not eligible until 04/12/2022.    Can you please let the above Dr know she is not eligible for new machine

## 2020-09-07 NOTE — Telephone Encounter (Signed)
I haven't found a note yet from her mask fitting at sleep center. She can use the mask she was given there, and can show it to the home care company for future replacement.

## 2020-09-07 NOTE — Telephone Encounter (Signed)
Patient sent this message this afternoon.  Looks like Patient may have had mask fitting today.  Had you seen notes from fitting, nasal pillow etc?   Message routed to Dr. Maple Hudson to advise

## 2020-09-22 ENCOUNTER — Encounter: Payer: Medicare Other | Attending: Surgery | Admitting: Skilled Nursing Facility1

## 2020-09-22 ENCOUNTER — Other Ambulatory Visit: Payer: Self-pay

## 2020-09-22 NOTE — Progress Notes (Signed)
Supervised Weight Loss Visit Bariatric Nutrition Education  Planned Surgery: sleeve  1 out of 6 SWL Appointments    NUTRITION ASSESSMENT   Anthropometrics  Start weight at [facility]: 352.6 lbs (date: 08/24/2020)  Weight: 353.4 lbs Height: 65 in BMI: 58.68 kg/m2     Clinical  Medical hx: Prediabetes, sleep apnea Medications: ozempic Labs: vitamin B12 (97)-8 months ago, vitamin D (12.29)-8 months ago Notable signs/symptoms: limited mobility due to hip pain Any previous deficiencies? Yes, vitamin B12 (97)-8 months ago, vitamin D (12.29)-8 months ago   Lifestyle & Dietary Hx  Dietitian checking in on those previously low labs with surgeon.  Pt arrives with seemingly supportive husband.   Estimated daily fluid intake: oz Supplements: Current average weekly physical activity:   24-Hr Dietary Recall First Meal: cereal  Snack:  Second Meal: italian sausage 2 peanut butter cookie or slad and wings Snack: fruit Third Meal: cabbage pinto beans whiting fish  Snack:  Beverages: water, orange juice, coffee + sugar, tea  Estimated Energy Needs Calories: 1500   NUTRITION DIAGNOSIS  Overweight/obesity ([place]-3.3) related to past poor dietary habits and physical inactivity as evidenced by patient w/ planned sleeve surgery following dietary guidelines for continued weight loss.   NUTRITION INTERVENTION  Nutrition counseling (C-1) and education (E-2) to facilitate bariatric surgery goals.  Pre-Op Goals Progress & New Goals . Track everything you eat and drink . Aim for a minimum 64 oz fluid per day . NEW: measure out cereal with measuring cup 1 cup and half banana  . NEW: Aim for a minimum 1 cup of non starchy vegetables  2 times a day 7 days a week . NEW: do not add bacon grease to your foods  Handouts Provided Include    Learning Style & Readiness for Change Teaching method utilized: Visual & Auditory  Demonstrated degree of understanding via: Teach Back  Readiness Level:  Action Barriers to learning/adherence to lifestyle change: none identified   RD's Notes for next Visit  . Assess pts adherence to chosen goals    MONITORING & EVALUATION Dietary intake, weekly physical activity, body weight, and pre-op goals in 1 month.   Next Steps  Patient is to return to [facility] in 1 month

## 2020-09-28 ENCOUNTER — Telehealth: Payer: Self-pay

## 2020-09-28 DIAGNOSIS — E559 Vitamin D deficiency, unspecified: Secondary | ICD-10-CM

## 2020-09-28 DIAGNOSIS — E539 Vitamin B deficiency, unspecified: Secondary | ICD-10-CM

## 2020-09-28 NOTE — Addendum Note (Signed)
Addended by: Kathreen Devoid on: 09/28/2020 07:53 AM   Modules accepted: Orders

## 2020-09-28 NOTE — Telephone Encounter (Signed)
-----   Message from [name] [name], MD sent at 09/25/2020 12:06 PM EDT ----- Last checked in 11/2019. We can arrange lab appt to check 25 OH vit D and B12 levels. Thanks, BJ ----- Message ----- From: Myles Lipps, RD Sent: 09/24/2020   1:56 PM EDT To: [name] [name], MD  Hello,   I am the bariatric dietitian working with [name] and I am contacting you on behalf of Dr. Dossie Der her bariatric surgeon to see if you thought it appropriate to write a prescription for Vitamin D and B12 for [name] going off of the labs she had 8 months ago. Dr. Dossie Der will not be getting labs drawn until closer to surgery.     Thank you, Serena Colonel MS, RD, CSOWM, LDN

## 2020-09-29 ENCOUNTER — Other Ambulatory Visit (INDEPENDENT_AMBULATORY_CARE_PROVIDER_SITE_OTHER): Payer: Medicare Other

## 2020-09-29 ENCOUNTER — Other Ambulatory Visit: Payer: Self-pay

## 2020-09-29 DIAGNOSIS — Z01818 Encounter for other preprocedural examination: Secondary | ICD-10-CM

## 2020-09-29 DIAGNOSIS — E539 Vitamin B deficiency, unspecified: Secondary | ICD-10-CM

## 2020-09-29 DIAGNOSIS — E559 Vitamin D deficiency, unspecified: Secondary | ICD-10-CM | POA: Diagnosis not present

## 2020-09-29 DIAGNOSIS — E785 Hyperlipidemia, unspecified: Secondary | ICD-10-CM

## 2020-09-30 LAB — VITAMIN D 25 HYDROXY (VIT D DEFICIENCY, FRACTURES): VITD: 18.42 ng/mL — ABNORMAL LOW (ref 30.00–100.00)

## 2020-09-30 LAB — VITAMIN B12: Vitamin B-12: 111 pg/mL — ABNORMAL LOW (ref 211–911)

## 2020-10-01 ENCOUNTER — Encounter: Payer: Self-pay | Admitting: Family Medicine

## 2020-10-06 ENCOUNTER — Ambulatory Visit: Payer: Medicare Other

## 2020-10-06 ENCOUNTER — Ambulatory Visit (INDEPENDENT_AMBULATORY_CARE_PROVIDER_SITE_OTHER): Payer: Medicare Other

## 2020-10-06 DIAGNOSIS — E538 Deficiency of other specified B group vitamins: Secondary | ICD-10-CM | POA: Diagnosis not present

## 2020-10-06 MED ORDER — CYANOCOBALAMIN 1000 MCG/ML IJ SOLN
1000.0000 ug | Freq: Once | INTRAMUSCULAR | Status: AC
  Administered 2020-10-06: 1000 ug via INTRAMUSCULAR

## 2020-10-06 NOTE — Progress Notes (Signed)
Per orders of Dr. Hernandez, injection of B12 given by Jaide Hillenburg E Nakenya Theall. Patient tolerated injection well.  

## 2020-10-09 ENCOUNTER — Telehealth: Payer: Self-pay | Admitting: Pharmacist

## 2020-10-09 NOTE — Chronic Care Management (AMB) (Signed)
Chronic Care Management Pharmacy Assistant   Name: [name]  MRN: [id number] DOB: 12/04/1955  Reason for Encounter: Disease State- Hypertension Adherence Call  Conditions to be addressed/monitored: HTN  Recent office visits:  . 03.22.2022 Started OTC Vit D 500 U daily for 30 days and then 200 units daily.  B12 injections 1000 mcg IM weekly for a month. (From lab results) . 01.11.2022 [name], [name], MD Family Medicine o Budesonide-Formoterol Fumarate 160-4.5 MCG/ACT 2 puffs Inhalation 2 times daily . 01.04.2022 Terressa Koyanagi, DO Family Medicine  o Benzonatate 100 mg Oral 3 times daily PRN  . 10.18.2021 [name], [name], MD Family Medicine o Semaglutide 2 MG/1.5ML Start with 0.25 mg weekly x 2 then 0.5 mg weekly and 1 mg. o Phentermine HCl 37.5 MG 0.5 tab daily for 7 days, then every other day, then every 3rd day and discontinue.   Recent consult visits:  . 01.20.2022 Cleotilde Neer, MD Cardiology o Started Rosuvastatin Calcium 50 mg daily o Discontinued the following medications - Benzonatate 100 mg Oral 3 times daily PRN  - Pantoprazole Sodium 40 mg Oral Daily PRN  - Promethazine-DM 6.25-15 MG/5ML 5 mils Oral 4 times daily PRN  . 12.14.2021 Waymon Budge, MD Pulmonary Disease . 11.11 2021 Sammuel Hines [facility] visits:  Medication Reconciliation was completed by comparing discharge summary, patient's EMR and Pharmacy list, and upon discussion with patient.  Admitted to the hospital on 07/09/2020 due to Covid Exposure. Discharge date was 07/09/2020. Discharged from [facility].    New?Medications Started at [facility] Discharge:?? -started Promethazine -Dm 6.25-15 MG/5ML due to Cough  Medications that remain the same after Hospital Discharge:??  -All other medications will remain the same.    Medications: Outpatient Encounter Medications as of 10/09/2020  Medication Sig Note  . albuterol (VENTOLIN HFA)  108 (90 Base) MCG/ACT inhaler Inhale 1-2 puffs into the lungs every 6 (six) hours as needed for wheezing or shortness of breath.   . budesonide-formoterol (SYMBICORT) 160-4.5 MCG/ACT inhaler Inhale 2 puffs into the lungs 2 (two) times daily.   . diclofenac (VOLTAREN) 75 MG EC tablet Take 1 tablet (75 mg total) by mouth 2 (two) times daily as needed.   . gabapentin (NEURONTIN) 300 MG capsule 1 cap am and noon, 2 caps at bedtime.   . hydrochlorothiazide (HYDRODIURIL) 25 MG tablet Take 1 tablet (25 mg total) by mouth daily. 07/09/2020: The patient stated she no longer takes this. She thought she was taking Lisinopril/HCTZ, but I called [facility] and they stated she takes plain Lisinopril.   . lidocaine (LIDODERM) 5 % Place 1 patch onto the skin daily as needed. Apply patch to area most significant pain once per day.  Remove and discard patch within 12 hours of application. (Patient taking differently: Place 1 patch onto the skin daily as needed. Apply patch to area most significant pain once per day.  Remove and discard patch within 12 hours of application.)   . lisinopril (ZESTRIL) 40 MG tablet TAKE 1 TABLET BY MOUTH  DAILY 07/09/2020: I called [facility] and they CONFIRMED this med  . methocarbamol (ROBAXIN) 500 MG tablet Take 1 tablet (500 mg total) by mouth every 8 (eight) hours as needed for muscle spasms.   . rosuvastatin (CRESTOR) 5 MG tablet Take 1 tablet (5 mg total) by mouth daily.   . Semaglutide,0.25 or 0.5MG /DOS, (OZEMPIC, 0.25 OR 0.5 MG/DOSE,) 2 MG/1.5ML SOPN INJECT 0.25 MG  UNDER THE SKIN ONCE A WEEK FOR 2 WEEKS, THEN INCREASE TO 0.5 MG ONCE A WEEK    No facility-administered encounter medications on file as of 10/09/2020.   Reviewed chart prior to disease state call. Spoke with patient regarding BP  Recent Office Vitals: BP Readings from Last 3 Encounters:  07/30/20 (!) 150/68  07/09/20 (!) 143/78  06/23/20 138/74   Pulse Readings from Last 3 Encounters:  07/30/20 77  07/09/20  99  06/23/20 67    Wt Readings from Last 3 Encounters:  09/22/20 (!) 353 lb 6.4 oz (160.3 kg)  08/24/20 (!) 352 lb 9.6 oz (159.9 kg)  07/30/20 (!) 358 lb 9.6 oz (162.7 kg)     Kidney Function Lab Results  Component Value Date/Time   CREATININE 0.80 07/09/2020 02:12 PM   CREATININE 0.83 04/27/2020 01:07 PM   CREATININE 0.69 12/02/2019 09:13 AM   CREATININE 0.74 09/15/2017 03:19 PM   GFR 104.08 12/02/2019 09:13 AM   GFRNONAA >60 07/09/2020 02:12 PM   GFRNONAA 75 04/27/2020 01:07 PM   GFRAA 87 04/27/2020 01:07 PM    BMP Latest Ref Rng & Units 07/09/2020 04/27/2020 12/02/2019  Glucose 70 - 99 mg/dL 93 270(J) 94  BUN 8 - 23 mg/dL 8 13 12   Creatinine 0.44 - 1.00 mg/dL 5.00 9.38  BUN/Creat Ratio 6 - 22 (calc) - NOT APPLICABLE -  Sodium 135 - 145 mmol/L 140 146 140  Potassium 3.5 - 5.1 mmol/L 3.8 4.5 4.0  Chloride 98 - 111 mmol/L 108 110 108  CO2 22 - 32 mmol/L 22 18(L) 26  Calcium 8.9 - 10.3 mg/dL 9.6 1.82 9.4   . Current antihypertensive regimen:  o Hydrochlorothiazide 25 mg daily o Lisinopril 40 mg daily  . How often are you checking your Blood Pressure? infrequently . Current home BP readings:  o 04.01 130/80 o 04.04 132/80 . What recent interventions/DTPs have been made by any provider to improve Blood Pressure control since last CPP Visit:  . Any recent hospitalizations or ED visits since last visit with CPP? Yes . What diet changes have been made to improve Blood Pressure Control?  o She states she is eating less fatty foods and drinking more water. . What exercise is being done to improve your Blood Pressure Control?  o None  Adherence Review: Is the patient currently on ACE/ARB medication? Yes Does the patient have >5 day gap between last estimated fill dates? No  I spoke with the patient and discussed medication inherence. She has no current issues with her current medication. She states that she is not taking her blood pressure consistently. She states that  she will try to do better in recording them more often. She states that she's been trying to eat less fatty food and drink more water. She has not changed her exercise routine. She started two new medications in January. Rosuvastatin Calcium 50 mg daily from cardiology and Budesonide-Formoterol Fumarate 160-4.5 MCG/ACT 2 puffs Inhalation 2 times daily from her PCP. She has not had any issues with these medications. Also she has started OTC vitamin D in B12 injections. There have not been any other recent changes. There are no side effects from her medications. She denies any head visit since December. A new CCM appointment was scheduled for May 25th at 2:30 PM. She is having no current issues with her pharmacy.  Star Rating Drugs:  Dispensed Quantity Pharmacy  Lisinopril 40 mg 03.18.2022 90 [facility]  Semaglutide 0.25 or 0.5 mg/dos 03.09.2022 90  [facility]   [place] Tomma Rakers, [place] [facility] Concierge [phone]

## 2020-10-12 ENCOUNTER — Telehealth: Payer: Self-pay

## 2020-10-12 DIAGNOSIS — I10 Essential (primary) hypertension: Secondary | ICD-10-CM

## 2020-10-12 MED ORDER — HYDROCHLOROTHIAZIDE 25 MG PO TABS: 25.0000 mg | ORAL_TABLET | Freq: Every day | ORAL | 3 refills | Status: DC

## 2020-10-12 MED ORDER — LISINOPRIL 40 MG PO TABS: 40.0000 mg | ORAL_TABLET | Freq: Every day | ORAL | 3 refills | Status: DC

## 2020-10-12 NOTE — Telephone Encounter (Signed)
-----   Message from Verner Chol, [facility] sent at 10/12/2020  4:14 PM EDT ----- Regarding: Refills Hi,  Ms. Monacelli requested refills of both lisinopril and HCTZ to be sent to [facility] on [address].  Thank you, Maddie

## 2020-10-13 ENCOUNTER — Other Ambulatory Visit: Payer: Self-pay

## 2020-10-13 ENCOUNTER — Ambulatory Visit (INDEPENDENT_AMBULATORY_CARE_PROVIDER_SITE_OTHER): Payer: Medicare Other

## 2020-10-13 DIAGNOSIS — E538 Deficiency of other specified B group vitamins: Secondary | ICD-10-CM | POA: Diagnosis not present

## 2020-10-13 MED ORDER — CYANOCOBALAMIN 1000 MCG/ML IJ SOLN
1000.0000 ug | Freq: Once | INTRAMUSCULAR | Status: AC
  Administered 2020-10-13: 1000 ug via INTRAMUSCULAR

## 2020-10-13 NOTE — Progress Notes (Signed)
Patient given vitamin B12 injection by Elwin Mocha, tolerated well.

## 2020-10-20 ENCOUNTER — Telehealth: Payer: Self-pay | Admitting: Internal Medicine

## 2020-10-20 ENCOUNTER — Ambulatory Visit (INDEPENDENT_AMBULATORY_CARE_PROVIDER_SITE_OTHER): Payer: Medicare Other

## 2020-10-20 DIAGNOSIS — E538 Deficiency of other specified B group vitamins: Secondary | ICD-10-CM | POA: Diagnosis not present

## 2020-10-20 MED ORDER — CYANOCOBALAMIN 1000 MCG/ML IJ SOLN
1000.0000 ug | Freq: Once | INTRAMUSCULAR | Status: AC
  Administered 2020-10-20: 1000 ug via INTRAMUSCULAR

## 2020-10-20 NOTE — Telephone Encounter (Signed)
Patient lost lab orders and wants to know if we can fax the orders to the [facility] on [address].   Fax number: [phone]

## 2020-10-20 NOTE — Telephone Encounter (Signed)
Returned call to patient, made patient aware that orders are in Epic and should be able to be accessed with out lab slip.   Advised patient to call back to office with any issues, questions, or concerns. Patient verbalized understanding.

## 2020-10-20 NOTE — Progress Notes (Signed)
Per orders of Dr. Jordan, injection of B12 given by Kaycee Haycraft E Amyria Komar. Patient tolerated injection well.  

## 2020-10-21 ENCOUNTER — Other Ambulatory Visit: Payer: Self-pay

## 2020-10-21 ENCOUNTER — Encounter: Payer: Medicare Other | Attending: Surgery | Admitting: Skilled Nursing Facility1

## 2020-10-21 NOTE — Progress Notes (Signed)
Supervised Weight Loss Visit Bariatric Nutrition Education  Planned Surgery: sleeve  2 out of 6 SWL Appointments    NUTRITION ASSESSMENT   Anthropometrics  Start weight at [facility]: 352.6 lbs (date: 08/24/2020)  Weight: 354.5 lbs Height: 65 in BMI: 59.04 kg/m2     Clinical  Medical hx: Prediabetes, sleep apnea Medications: ozempic Labs: vitamin B12 (111) vitamin D (18.42) Notable signs/symptoms: limited mobility due to hip pain Any previous deficiencies? Yes, vitamin B12 , vitamin D    Lifestyle & Dietary Hx  Pt arrived in a wheel chair pushed by her husband due to extreme hip pain.  Pt states she did start a b12 shot and vitamin D supplements.  Pt states she does not like canned or frozen vegetables. Pt states she will eat the canned veggies if there is pepper in them. Pt states she cannot stand to make her meals so she will have a microwave and fridge in the backroom for her to use.   Pts husband makes and serves meals.   Estimated daily fluid intake: oz Supplements: Current average weekly physical activity:   24-Hr Dietary Recall First Meal: cereal or egg + sausage  Snack:  Second Meal: italian sausage 2 peanut butter cookie or salad and wings or chicken salad and crackers Snack: fruit Third Meal: cabbage pinto beans whiting fish or 2 bacon 2 eggs and grits Snack:  Beverages: water, orange juice, coffee + sugar, tea  Estimated Energy Needs Calories: 1500   NUTRITION DIAGNOSIS  Overweight/obesity ([place]-3.3) related to past poor dietary habits and physical inactivity as evidenced by patient w/ planned sleeve surgery following dietary guidelines for continued weight loss.   NUTRITION INTERVENTION  Nutrition counseling (C-1) and education (E-2) to facilitate bariatric surgery goals. Educated pts husband on putting together balanced meals  Pre-Op Goals Progress & New Goals . Track everything you eat and drink . Aim for a minimum 64 oz fluid per day . continue:  measure out cereal with measuring cup 1 cup and half banana (did not do this) . continue: Aim for a minimum 1 cup of non starchy vegetables  2 times a day 7 days a week . continue: do not add bacon grease to your foods  Handouts Provided Include  Detailed MyPlate  Learning Style & Readiness for Change Teaching method utilized: Visual & Auditory  Demonstrated degree of understanding via: Teach Back  Readiness Level: Action Barriers to learning/adherence to lifestyle change: none identified   RD's Notes for next Visit  . Assess pts adherence to chosen goals    MONITORING & EVALUATION Dietary intake, weekly physical activity, body weight, and pre-op goals in 1 month.   Next Steps  Patient is to return to [facility] in 1 month

## 2020-10-27 ENCOUNTER — Other Ambulatory Visit: Payer: Self-pay

## 2020-10-27 ENCOUNTER — Ambulatory Visit (INDEPENDENT_AMBULATORY_CARE_PROVIDER_SITE_OTHER): Payer: Medicare Other

## 2020-10-27 ENCOUNTER — Ambulatory Visit: Payer: Medicare Other

## 2020-10-27 DIAGNOSIS — Z01818 Encounter for other preprocedural examination: Secondary | ICD-10-CM | POA: Diagnosis not present

## 2020-10-27 DIAGNOSIS — E538 Deficiency of other specified B group vitamins: Secondary | ICD-10-CM | POA: Diagnosis not present

## 2020-10-27 DIAGNOSIS — E785 Hyperlipidemia, unspecified: Secondary | ICD-10-CM | POA: Diagnosis not present

## 2020-10-27 MED ORDER — CYANOCOBALAMIN 1000 MCG/ML IJ SOLN
1000.0000 ug | Freq: Once | INTRAMUSCULAR | Status: AC
  Administered 2020-10-27: 1000 ug via INTRAMUSCULAR

## 2020-10-27 NOTE — Progress Notes (Signed)
Per orders of Dr. Jordan, injection of B12 given by Jonika Critz E Countess Biebel. Patient tolerated injection well.  

## 2020-10-28 LAB — COMPREHENSIVE METABOLIC PANEL
ALT: 8 IU/L (ref 0–32)
AST: 11 IU/L (ref 0–40)
Albumin/Globulin Ratio: 1.5 (ref 1.2–2.2)
Albumin: 4.4 g/dL (ref 3.8–4.8)
Alkaline Phosphatase: 122 IU/L — ABNORMAL HIGH (ref 44–121)
BUN/Creatinine Ratio: 16 (ref 12–28)
BUN: 14 mg/dL (ref 8–27)
Bilirubin Total: 0.4 mg/dL (ref 0.0–1.2)
CO2: 25 mmol/L (ref 20–29)
Calcium: 9.9 mg/dL (ref 8.7–10.3)
Chloride: 101 mmol/L (ref 96–106)
Creatinine, Ser: 0.85 mg/dL (ref 0.57–1.00)
Globulin, Total: 2.9 g/dL (ref 1.5–4.5)
Glucose: 89 mg/dL (ref 65–99)
Potassium: 4.3 mmol/L (ref 3.5–5.2)
Sodium: 141 mmol/L (ref 134–144)
Total Protein: 7.3 g/dL (ref 6.0–8.5)
eGFR: 77 mL/min/{1.73_m2} (ref >59)

## 2020-10-28 LAB — LIPID PANEL
Chol/HDL Ratio: 4.4 ratio (ref 0.0–4.4)
Cholesterol, Total: 188 mg/dL (ref 100–199)
HDL: 43 mg/dL (ref >39)
LDL Chol Calc (NIH): 128 mg/dL — ABNORMAL HIGH (ref 0–99)
Triglycerides: 91 mg/dL (ref 0–149)
VLDL Cholesterol Cal: 17 mg/dL (ref 5–40)

## 2020-11-02 ENCOUNTER — Encounter: Payer: Self-pay | Admitting: Internal Medicine

## 2020-11-02 ENCOUNTER — Ambulatory Visit (INDEPENDENT_AMBULATORY_CARE_PROVIDER_SITE_OTHER): Payer: Medicare Other | Admitting: Internal Medicine

## 2020-11-02 ENCOUNTER — Other Ambulatory Visit: Payer: Self-pay

## 2020-11-02 VITALS — BP 126/74 | HR 67 | Ht 65.0 in | Wt 349.4 lb

## 2020-11-02 DIAGNOSIS — G4733 Obstructive sleep apnea (adult) (pediatric): Secondary | ICD-10-CM | POA: Diagnosis not present

## 2020-11-02 DIAGNOSIS — Z01810 Encounter for preprocedural cardiovascular examination: Secondary | ICD-10-CM

## 2020-11-02 DIAGNOSIS — R9431 Abnormal electrocardiogram [ECG] [EKG]: Secondary | ICD-10-CM | POA: Diagnosis not present

## 2020-11-02 DIAGNOSIS — E785 Hyperlipidemia, unspecified: Secondary | ICD-10-CM | POA: Diagnosis not present

## 2020-11-02 DIAGNOSIS — I10 Essential (primary) hypertension: Secondary | ICD-10-CM

## 2020-11-02 MED ORDER — ROSUVASTATIN CALCIUM 10 MG PO TABS: 10.0000 mg | ORAL_TABLET | Freq: Every day | ORAL | 3 refills | Status: DC

## 2020-11-02 NOTE — Patient Instructions (Addendum)
Medication Instructions:  START: CRESTOR (ROSUVASTATIN) 10mg  DAILY  *If you need a refill on your cardiac medications before your next appointment, please call your pharmacy*  LABS: PLEASE RETURN TO OFFICE IN 8 WEEKS FOR REPEAT LAB WORK- YOU WILL NEED TO BE FASTING  Testing/Procedures: Your physician has requested that you have a lexiscan myoview. For further information please visit . Please follow instruction sheet, as given. This will take place at [email or web address]  The test will take approximately 3 to 4 hours to complete; you may bring reading material.  If someone comes with you to your appointment, they will need to remain in the main lobby due to limited space in the testing area.    How to prepare for your Myocardial Perfusion Test:  Do not eat or drink 3 hours prior to your test, except you may have water.  Do not consume products containing caffeine (regular or decaffeinated) 12 hours prior to your test. (ex: coffee, chocolate, sodas, tea).  Do wear comfortable clothes (no dresses or overalls) and walking shoes, tennis shoes preferred (No heels or open toe shoes are allowed).  Do NOT wear cologne, perfume, aftershave, or lotions (deodorant is allowed).  If you use an inhaler, use it the AM of your test and bring it with you.   If you use a nebulizer, use it the AM of your test.   If these instructions are not followed, your test will have to be rescheduled.  Follow-Up: At [facility], you and your health needs are our priority.  As part of our continuing mission to provide you with exceptional heart care, we have created designated Provider Care Teams.  These Care Teams include your primary Cardiologist (physician) and Advanced Practice Providers (APPs -  Physician Assistants and Nurse Practitioners) who all work together to provide you with the care you need, when you need it.  Your next appointment:   6 month(s)  The format for your  next appointment:   In Person  Provider:   [facility], MD

## 2020-11-02 NOTE — Progress Notes (Signed)
Cardiology Office Note:    Date:  11/02/2020   ID:  [name], DOB [birth year], MRN [id number]  PCP:  [name], [name], MD  Cardiologist:  Armanda Magic, MD  Electrophysiologist:  None   Referring MD: [name], [name], MD   Chief Complaint/Reason for Referral: Preoperative risk stratification.  History of Present Illness:    [name] is a 65 y.o. female with a history of HTN, morbid obesity, HLD, prediabetes, asthma. Presents for preoperative risk stratification/optimization prior to bariatric surgery.  No surgical date has been set.  Has two grandchildren 2 and 46 yo old.  She has to chase them around a bit when she watches them, and gets tired quickly.  She does occasionally have palpitations.  Some shortness of breath with activity, not positional.  She has an abnormal EKG with T wave inversions and T wave flattening inferolaterally.   Preoperative cardiovascular evaluation Pertinent past cardiac history: no Prior cardiac workup: nuc normal 2017, echo normal 2019.  History of valve disease: no History of CAD/PAD/CVA/TIA: no History of heart failure: no  History of arrhythmia: no On anticoagulation: no  Additional history (hypertension, diabetes/on insulin, CKD/current creatinine, OSA, anesthesia complications): has OSA, repeat sleep test performed, new mask pending. Current symptoms: none Functional capacity: <4 METS    Past Medical History:  Diagnosis Date  . Abnormal EKG 06/2003   History of inverted T waves V1-V3. Normal 2D echo (07/23/2003): LVEF 65%.  . Anemia    BL Hgb 11-12. Ferritin 14 - low normal (08/2007). Colonoscopy 2009 - external hemorrhoids (excellent prep). Last anemia panel (12/2010) - Iron  24, TIBC 269,  B12  316, Folate 11.9, Ferritin 73.  . Asthma   . B12 deficiency   . Back pain   . Chest pain   . Degenerative joint disease    BL knees (L>R), lumbar spine. Followed by Sports Medicine, Dr. Jennette Kettle.  . Dyspnea   . History of  multiple pulmonary nodules    Incidental finding: CT Abd/ Pelvis (04/2010) - Several small lower lobe lung nodules, including one pure ground-glass pulmonary nodule measuring 8 mm in the left lower lobe.  Recommend follow-up chest CT (IV contrast preferred) in 6 months to document stability. //  CT Abd/ Pelvis (07/2010) -  3 mm RLL and  8 mm LLL nodule stable.  Other nodules unchanged, likely benign.  . Hyperlipidemia   . Hypertension   . Incarcerated ventral hernia 04/2010   Noted on CT Abd/ pelvis (04/2010). Patient now s/p ventral hernia repair by Dr. Gerrit Friends (12/2010)  . Insomnia   . 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 Left hip pain   . Left ventricular diastolic dysfunction   . Leg edema   . Lower extremity edema    Chronic. 2D echo (2005) - EF 65%.  . Obesity   . OSA (obstructive sleep apnea)   . Palpitations   . Positive D dimer   . Right knee pain     Past Surgical History:  Procedure Laterality Date  . COLONOSCOPY  2009  . COLONOSCOPY WITH PROPOFOL N/A 12/18/2017   Procedure: COLONOSCOPY WITH PROPOFOL;  Surgeon: Iva Boop, MD;  Location: [facility];  Service: Endoscopy;  Laterality: N/A;  . FOOT SURGERY  2004    left  . INCISION AND DRAINAGE ABSCESS ANAL  07/2008   I&D and debridgement of anorectal abscess  . INCISIONAL HERNIA REPAIR  12/2010   Repair of incarcerated ventral  incisional hernia with Ethicon mesh patch - performed by Dr. Gerrit FriendsGerkin.   . INGUINAL HERNIA REPAIR    . JOINT REPLACEMENT Left 2013   left knee  . TOTAL KNEE ARTHROPLASTY  06/16/2011   Left TOTAL KNEE ARTHROPLASTY;  Surgeon: Kennieth RadArthur F Carter;  Location: [facility] OR;  Service: Orthopedics;  Laterality: Left;  left total knee arthroplasty  . TUBAL LIGATION      Current Medications: Current Meds  Medication Sig  . albuterol (VENTOLIN HFA) 108 (90 Base) MCG/ACT inhaler Inhale 1-2 puffs into the lungs every 6 (six) hours as needed for wheezing or shortness of breath.  .  budesonide-formoterol (SYMBICORT) 160-4.5 MCG/ACT inhaler Inhale 2 puffs into the lungs 2 (two) times daily.  . diclofenac (VOLTAREN) 75 MG EC tablet Take 1 tablet (75 mg total) by mouth 2 (two) times daily as needed.  . gabapentin (NEURONTIN) 300 MG capsule 1 cap am and noon, 2 caps at bedtime.  . hydrochlorothiazide (HYDRODIURIL) 25 MG tablet Take 1 tablet (25 mg total) by mouth daily.  [name]. lidocaine (LIDODERM) 5 % Place 1 patch onto the skin daily as needed. Apply patch to area most significant pain once per day.  Remove and discard patch within 12 hours of application. (Patient taking differently: Place 1 patch onto the skin daily as needed. Apply patch to area most significant pain once per day.  Remove and discard patch within 12 hours of application.)  . lisinopril (ZESTRIL) 40 MG tablet Take 1 tablet (40 mg total) by mouth daily.  . methocarbamol (ROBAXIN) 500 MG tablet Take 1 tablet (500 mg total) by mouth every 8 (eight) hours as needed for muscle spasms.  . Semaglutide,0.25 or 0.5MG /DOS, (OZEMPIC, 0.25 OR 0.5 MG/DOSE,) 2 MG/1.5ML SOPN INJECT 0.25 MG UNDER THE SKIN ONCE A WEEK FOR 2 WEEKS, THEN INCREASE TO 0.5 MG ONCE A WEEK  . [DISCONTINUED] rosuvastatin (CRESTOR) 5 MG tablet Take 1 tablet (5 mg total) by mouth daily.     Allergies:   Ketorolac tromethamine, Atrovent [ipratropium], Latex, and Tape   Social History   Tobacco Use  . Smoking status: Former Smoker    Packs/day: 0.50    Years: 20.00    Pack years: 10.00    Types: Cigarettes    Quit date: 09/10/1998    Years since quitting: 22.1  . Smokeless tobacco: Never Used  . Tobacco comment: 65 yo   Vaping Use  . Vaping Use: Never used  Substance Use Topics  . Alcohol use: No    Alcohol/week: 0.0 standard drinks  . Drug use: No     Family History: The patient's family history includes Dementia in her father; Depression in her father and mother; Diabetes in her brother and mother; Heart disease in her father and mother;  Hyperlipidemia in her father and mother; Hypertension in her brother, mother, and sister; Kidney disease in her mother; Rashes / Skin problems in her maternal grandfather; Stroke in her mother; Sudden death in her father and mother. There is no history of Heart attack.  ROS:   Please see the history of present illness.    All other systems reviewed and are negative.  EKGs/Labs/Other Studies Reviewed:    The following studies were reviewed today:  EKG: Normal sinus rhythm, criteria for LVH, nonspecific T wave abnormality.  Recent Labs: 07/09/2020: Hemoglobin 12.7; Platelets 313 10/27/2020: ALT 8; BUN 14; Creatinine, Ser 0.85; Potassium 4.3; Sodium 141  Recent Lipid Panel    Component Value Date/Time   CHOL 188 10/27/2020  1546   TRIG 91 10/27/2020 1546   HDL 43 10/27/2020 1546   CHOLHDL 4.4 10/27/2020 1546   CHOLHDL 5 12/02/2019 0913   VLDL 17.4 12/02/2019 0913   LDLCALC 128 (H) 10/27/2020 1546    Physical Exam:    VS:  BP 126/74   Pulse 67   Ht 5\' 5"  (1.651 m)   Wt (!) 349 lb 6.4 oz (158.5 kg)   LMP 09/09/2009   SpO2 97%   BMI 58.14 kg/m     Wt Readings from Last 5 Encounters:  11/02/20 (!) 349 lb 6.4 oz (158.5 kg)  10/21/20 (!) 354 lb 12.8 oz (160.9 kg)  09/22/20 (!) 353 lb 6.4 oz (160.3 kg)  08/24/20 (!) 352 lb 9.6 oz (159.9 kg)  07/30/20 (!) 358 lb 9.6 oz (162.7 kg)    Constitutional: No acute distress Eyes: sclera non-icteric, normal conjunctiva and lids ENMT: normal dentition, moist mucous membranes Cardiovascular: regular rhythm, normal rate, no murmurs. S1 and S2 normal. Radial pulses normal bilaterally. No jugular venous distention.  Respiratory: clear to auscultation bilaterally GI : normal bowel sounds, soft and nontender. No distention.   MSK: extremities warm, well perfused. No edema.  NEURO: grossly nonfocal exam, moves all extremities. PSYCH: alert and oriented x 3, normal mood and affect.   ASSESSMENT:    1. Preoperative cardiovascular examination    2. Abnormal electrocardiogram   3. Hyperlipidemia, unspecified hyperlipidemia type   4. Primary hypertension   5. Obstructive sleep apnea    PLAN:    Preoperative cardiovascular examination - Plan: EKG 12-Lead Abnormal electrocardiogram - Plan: EKG 12-Lead, MYOCARDIAL PERFUSION IMAGING -Patient has a mildly abnormal EKG with T wave abnormalities and has exertional fatigue and shortness of breath.  She would like to be extra careful prior to surgery for restratification.  With risk factors of hyperlipidemia and hypertension and obesity, it is reasonable to her stratify her further for surgery.  We will perform a Lexiscan Myoview stress test at [facility] on the D SPECT camera given body habitus.  Hyperlipidemia, unspecified hyperlipidemia type -LDL is still mildly elevated at 128 after initiating Crestor 5 mg daily 3 months ago.  We will increase the dose to 10 mg daily and check labs again in 8 weeks.  Alkaline phosphatase is borderline above the normal range, will complete CMP with lipids.  Primary hypertension-blood pressure well controlled, no changes to antihypertensive regimen today.  Obstructive sleep apnea-she went for repeat sleep study, has not yet been able to obtain the new mask but settings have been changed.  Continue to follow with sleep medicine.  Total time of encounter: 35 minutes total time of encounter, including 25 minutes spent in face-to-face patient care on the date of this encounter. This time includes coordination of care and counseling regarding above mentioned problem list. Remainder of non-face-to-face time involved reviewing chart documents/testing relevant to the patient encounter and documentation in the medical record. I have independently reviewed documentation from referring provider.   [facility], MD, [facility] [place]  [facility]   Shared Decision Making/Informed Consent:   Shared Decision Making/Informed Consent The risks [chest pain,  shortness of breath, cardiac arrhythmias, dizziness, blood pressure fluctuations, myocardial infarction, stroke/transient ischemic attack, nausea, vomiting, allergic reaction, radiation exposure, metallic taste sensation and life-threatening complications (estimated to be 1 in 10,000)], benefits (risk stratification, diagnosing coronary artery disease, treatment guidance) and alternatives of a nuclear stress test were discussed in detail with [name] and she agrees to proceed.  Medication Adjustments/Labs and Tests Ordered: Current medicines are reviewed at length with the patient today.  Concerns regarding medicines are outlined above.   Orders Placed This Encounter  Procedures  . Comprehensive metabolic panel  . Lipid panel  . MYOCARDIAL PERFUSION IMAGING  . EKG 12-Lead    Meds ordered this encounter  Medications  . rosuvastatin (CRESTOR) 10 MG tablet    Sig: Take 1 tablet (10 mg total) by mouth daily.    Dispense:  30 tablet    Refill:  3    Patient Instructions  Medication Instructions:  START: CRESTOR (ROSUVASTATIN) 10mg  DAILY  *If you need a refill on your cardiac medications before your next appointment, please call your pharmacy*  LABS: PLEASE RETURN TO OFFICE IN 8 WEEKS FOR REPEAT LAB WORK- YOU WILL NEED TO BE FASTING  Testing/Procedures: Your physician has requested that you have a lexiscan myoview. For further information please visit . Please follow instruction sheet, as given. This will take place at [email or web address]  The test will take approximately 3 to 4 hours to complete; you may bring reading material.  If someone comes with you to your appointment, they will need to remain in the main lobby due to limited space in the testing area.    How to prepare for your Myocardial Perfusion Test:  Do not eat or drink 3 hours prior to your test, except you may have water.  Do not consume products containing caffeine (regular or  decaffeinated) 12 hours prior to your test. (ex: coffee, chocolate, sodas, tea).  Do wear comfortable clothes (no dresses or overalls) and walking shoes, tennis shoes preferred (No heels or open toe shoes are allowed).  Do NOT wear cologne, perfume, aftershave, or lotions (deodorant is allowed).  If you use an inhaler, use it the AM of your test and bring it with you.   If you use a nebulizer, use it the AM of your test.   If these instructions are not followed, your test will have to be rescheduled.  Follow-Up: At [facility], you and your health needs are our priority.  As part of our continuing mission to provide you with exceptional heart care, we have created designated Provider Care Teams.  These Care Teams include your primary Cardiologist (physician) and Advanced Practice Providers (APPs -  Physician Assistants and Nurse Practitioners) who all work together to provide you with the care you need, when you need it.  Your next appointment:   6 month(s)  The format for your next appointment:   In Person  Provider:   [facility], MD

## 2020-11-11 ENCOUNTER — Encounter (HOSPITAL_COMMUNITY): Payer: Self-pay | Admitting: *Deleted

## 2020-11-11 ENCOUNTER — Telehealth (HOSPITAL_COMMUNITY): Payer: Self-pay | Admitting: *Deleted

## 2020-11-11 NOTE — Telephone Encounter (Signed)
Patient given detailed instructions per Myocardial Perfusion Study Information Sheet for the test on 11/18/20 at 0815. Patient notified to arrive 15 minutes early and that it is imperative to arrive on time for appointment to keep from having the test rescheduled.  If you need to cancel or reschedule your appointment, please call the office within 24 hours of your appointment. . Patient verbalized understanding.Denecia Brunette, Adelene Idler Mychart letter sent with instructions

## 2020-11-18 ENCOUNTER — Ambulatory Visit (HOSPITAL_COMMUNITY): Payer: Medicare Other | Attending: Cardiovascular Disease

## 2020-11-18 ENCOUNTER — Other Ambulatory Visit: Payer: Self-pay

## 2020-11-18 DIAGNOSIS — R9431 Abnormal electrocardiogram [ECG] [EKG]: Secondary | ICD-10-CM | POA: Diagnosis not present

## 2020-11-18 MED ORDER — TECHNETIUM TC 99M TETROFOSMIN IV KIT
30.7000 | PACK | Freq: Once | INTRAVENOUS | Status: AC | PRN
  Administered 2020-11-18: 30.7 via INTRAVENOUS
  Filled 2020-11-18: qty 31

## 2020-11-18 MED ORDER — REGADENOSON 0.4 MG/5ML IV SOLN
0.4000 mg | Freq: Once | INTRAVENOUS | Status: AC
  Administered 2020-11-18: 0.4 mg via INTRAVENOUS

## 2020-11-19 ENCOUNTER — Ambulatory Visit (HOSPITAL_COMMUNITY): Payer: Medicare Other | Attending: Cardiology

## 2020-11-19 ENCOUNTER — Encounter: Payer: Medicare Other | Attending: Surgery | Admitting: Skilled Nursing Facility1

## 2020-11-19 DIAGNOSIS — R7303 Prediabetes: Secondary | ICD-10-CM | POA: Insufficient documentation

## 2020-11-19 DIAGNOSIS — G4730 Sleep apnea, unspecified: Secondary | ICD-10-CM | POA: Insufficient documentation

## 2020-11-19 LAB — MYOCARDIAL PERFUSION IMAGING
LV dias vol: 80 mL (ref 46–106)
LV sys vol: 36 mL
Peak HR: 89 {beats}/min
Rest HR: 62 {beats}/min
SDS: 3
SRS: 2
SSS: 5
TID: 1.08

## 2020-11-19 MED ORDER — TECHNETIUM TC 99M TETROFOSMIN IV KIT
30.6000 | PACK | Freq: Once | INTRAVENOUS | Status: AC | PRN
  Administered 2020-11-19: 30.6 via INTRAVENOUS
  Filled 2020-11-19: qty 31

## 2020-11-19 NOTE — Progress Notes (Signed)
Supervised Weight Loss Visit Bariatric Nutrition Education  Planned Surgery: sleeve  3 out of 6 [facility] Appointments    NUTRITION ASSESSMENT   Anthropometrics  Start weight at [facility]: 352.6 lbs (date: 08/24/2020)  Weight: pt unable to weigh due to pain in legs Height: 66 in BMI: 59.04 kg/m2     Clinical  Medical hx: Prediabetes, sleep apnea Medications: ozempic Labs: vitamin B12 (111) vitamin D (18.42) Notable signs/symptoms: limited mobility due to hip pain Any previous deficiencies? Yes, vitamin B12 , vitamin D    Lifestyle & Dietary Hx  Pt arrived in a wheel chair pushed by her husband due to extreme hip pain.  Pt states she did start a b12 shot and vitamin D supplements.  Pt states she does not like canned or frozen vegetables. Pt states she will eat the canned veggies if there is pepper in them. Pt states she cannot stand to make her meals so she will have a microwave and fridge in the backroom for her to use.   Pts husband makes and serves meals.   Pt states she has been eating more non starchy vegetables latley. Pt states she has been reducing the sugar in her coffee sometimes. Pt states she has cut back on tea.    Estimated daily fluid intake: oz Supplements: Current average weekly physical activity:   24-Hr Dietary Recall First Meal: cereal or egg + sausage + grits Snack:  Second Meal: italian sausage 2 peanut butter cookie or salad and wings or chicken salad: egg, cucumber, carrots, tomato, cheese, ranch or cesar and crackers or fruit + peanut butter  Snack: fruit Third Meal: cabbage pinto beans whiting fish or 2 bacon 2 eggs and grits or green beans, mashed potatoes, pork chop Snack: ice cream Beverages: water, orange juice, coffee + sugar, tea  Estimated Energy Needs Calories: 1500   NUTRITION DIAGNOSIS  Overweight/obesity ([place]-3.3) related to past poor dietary habits and physical inactivity as evidenced by patient w/ planned sleeve surgery following dietary  guidelines for continued weight loss.   NUTRITION INTERVENTION  Nutrition counseling (C-1) and education (E-2) to facilitate bariatric surgery goals. Educated pts husband on putting together balanced meals  Pre-Op Goals Progress & New Goals . Track everything you eat and drink . Aim for a minimum 64 oz fluid per day . continue: measure out cereal with measuring cup 1 cup and half banana (did not do this) . continue: Aim for a minimum 1 cup of non starchy vegetables  2 times a day 7 days a week . continue: do not add bacon grease to your foods . NEW: add protein to your fruit and crackers such as dried edamame  . NEW: cut out orange juice  . NEW: limit your fruit to 3 servings per day  Handouts Provided Include  Detailed MyPlate  Learning Style & Readiness for Change Teaching method utilized: Visual & Auditory  Demonstrated degree of understanding via: Teach Back  Readiness Level: Action Barriers to learning/adherence to lifestyle change: none identified   RD's Notes for next Visit  . Assess pts adherence to chosen goals    MONITORING & EVALUATION Dietary intake, weekly physical activity, body weight, and pre-op goals in 1 month.   Next Steps  Patient is to return to [facility] in 1 month

## 2020-12-01 ENCOUNTER — Other Ambulatory Visit: Payer: Self-pay

## 2020-12-01 ENCOUNTER — Telehealth: Payer: Self-pay | Admitting: Pharmacist

## 2020-12-01 ENCOUNTER — Ambulatory Visit (INDEPENDENT_AMBULATORY_CARE_PROVIDER_SITE_OTHER): Payer: Medicare Other

## 2020-12-01 DIAGNOSIS — E538 Deficiency of other specified B group vitamins: Secondary | ICD-10-CM | POA: Diagnosis not present

## 2020-12-01 MED ORDER — CYANOCOBALAMIN 1000 MCG/ML IJ SOLN
1000.0000 ug | Freq: Once | INTRAMUSCULAR | Status: AC
  Administered 2020-12-01: 1000 ug via INTRAMUSCULAR

## 2020-12-01 NOTE — Progress Notes (Signed)
Per orders from Dr [name], patient given B12 injection by Steffanie Rainwater, tolerated well.

## 2020-12-01 NOTE — Chronic Care Management (AMB) (Signed)
Patient called to remind of appointment with [facility] on 12/02/2020  Patient aware of appointment date, time, and type of appointment (either telephone. Patient aware to have/bring all medications, supplements, blood pressure and/or blood sugar logs to visit.  Questions: Have you had any recent office visit or specialist visit outside of [facility] systems?  Sheran Luz- Physical Medicine and Rehabilitation  Are there any concerns you would like to discuss during your office visit?  No Are you having any problems obtaining your medications? (Whether it pharmacy issues or cost)  No  Star Rating Drug:  Dispensed Quantity Pharmacy  Rosuvastatin 10 mg 04.25.2022 90 [facility]  Ozempic 2 mg/ 1.5 ml  05.02.2022 1.5 ml [facility]   Any gaps in medications fill history? No    Berenice Bouton, [facility] Clinical Pharmacist Assistant [phone]

## 2020-12-02 ENCOUNTER — Ambulatory Visit (INDEPENDENT_AMBULATORY_CARE_PROVIDER_SITE_OTHER): Payer: Medicare Other | Admitting: Pharmacist

## 2020-12-02 DIAGNOSIS — E785 Hyperlipidemia, unspecified: Secondary | ICD-10-CM | POA: Diagnosis not present

## 2020-12-02 DIAGNOSIS — I10 Essential (primary) hypertension: Secondary | ICD-10-CM | POA: Diagnosis not present

## 2020-12-02 NOTE — Progress Notes (Signed)
Chronic Care Management Pharmacy Note  12/08/2020 Name:  [name] MRN:  [id number] DOB:  02/16/1956  Subjective: [name] is an 65 y.o. year old female who is a primary patient of [name], [name], MD.  The CCM team was consulted for assistance with disease management and care coordination needs.    Engaged with patient by telephone for follow up visit in response to provider referral for pharmacy case management and/or care coordination services.   Consent to Services:  The patient was given information about Chronic Care Management services, agreed to services, and gave verbal consent prior to initiation of services.  Please see initial visit note for detailed documentation.   Patient Care Team: [name], [name], MD as PCP - General (Family Medicine) Sueanne Margarita, MD as PCP - Cardiology (Cardiology) Viona Gilmore, [facility] as Pharmacist (Pharmacist)  Recent office visits: 12/01/20 Anderson Malta, CMA: Patient presented for vitamin B12 injection.   07/21/20 [name], MD: Patient presented for video visit for COVID infection follow up and congestion. Restarted Sybmicort 2 puffs twice daily.  07/14/20 Colin Benton, MD: Patient presented for video visit for COVID infection. Prescribed benzonatate for cough.  Recent consult visits: 11/19/20 [name], RD (Nutrition): Patient presented for nutrition counseling for bariatric surgery planned.  11/04/20 Suella Broad, MD (phys med & rehab): Unable to access notes.  11/02/20 [name], MD (cardiology): Patient presented for preop clearance. Increased Crestor to 10 mg daily.  09/22/20 [name], RD (Nutrition): Patient presented for nutrition counseling for bariatric surgery planned.  08/24/20 [name], RD (Nutrition): Patient presented for nutrition counseling for bariatric surgery planned.  07/30/20 [name], MD (cardiology): Patient presented for preop clearance initial consult. Started  rosuvastatin 5 mg daily.  06/23/20 [name], MD (pulmonary): Patient presented for OSA follow up. Plan for sleep study.  Hospital visits: 07/09/20 Patient presented to the ED and test positive for COVID.  Objective:  Lab Results  Component Value Date   CREATININE 0.85 10/27/2020   BUN 14 10/27/2020   GFR 104.08 12/02/2019   GFRNONAA >60 07/09/2020   GFRAA 87 04/27/2020   NA 141 10/27/2020   K 4.3 10/27/2020   CALCIUM 9.9 10/27/2020   CO2 25 10/27/2020   GLUCOSE 89 10/27/2020    Lab Results  Component Value Date/Time   HGBA1C 5.7 12/02/2019 09:13 AM   HGBA1C 5.7 (H) 08/16/2018 12:07 PM   GFR 104.08 12/02/2019 09:13 AM   GFR 107.59 05/02/2018 11:24 AM   MICROALBUR 0.76 08/16/2007 09:19 PM    Last diabetic Eye exam: No results found for: HMDIABEYEEXA  Last diabetic Foot exam: No results found for: HMDIABFOOTEX   Lab Results  Component Value Date   CHOL 188 10/27/2020   HDL 43 10/27/2020   LDLCALC 128 (H) 10/27/2020   TRIG 91 10/27/2020   CHOLHDL 4.4 10/27/2020    Hepatic Function Latest Ref Rng & Units 10/27/2020 07/09/2020 12/02/2019  Total Protein 6.0 - 8.5 g/dL 7.3 7.2 6.4  Albumin 3.8 - 4.8 g/dL 4.4 3.5 3.8  AST 0 - 40 IU/L 11 14(L) 11  ALT 0 - 32 IU/L 8 8 6   Alk Phosphatase 44 - 121 IU/L 122(H) 106 105  Total Bilirubin 0.0 - 1.2 mg/dL 0.4 0.5 0.3  Bilirubin, Direct 0.0 - 0.3 mg/dL - - -    Lab Results  Component Value Date/Time   TSH 1.900 05/09/2018 10:07 AM   TSH 3.440 10/25/2017 08:59 AM   TSH 1.307 07/16/2014 12:02  PM   FREET4 1.20 05/09/2018 10:07 AM    CBC Latest Ref Rng & Units 07/09/2020 05/09/2018 11/07/2017  WBC 4.0 - 10.5 K/uL 5.0 5.1 5.3  Hemoglobin 12.0 - 15.0 g/dL 12.7 11.9 11.8(L)  Hematocrit 36.0 - 46.0 % 41.4 38.1 36.4  Platelets 150 - 400 K/uL 313 - 363.0    Lab Results  Component Value Date/Time   VD25OH 18.42 (L) 09/29/2020 03:11 PM   VD25OH 12.29 (L) 12/02/2019 09:13 AM    Clinical ASCVD: No  The 10-year ASCVD risk  score Mikey Bussing DC Jr., et al., 2013) is: 15.5%   Values used to calculate the score:     Age: 66 years     Sex: Female     Is Non-Hispanic African American: Yes     Diabetic: No     Tobacco smoker: Yes     Systolic Blood Pressure: 700 mmHg     Is BP treated: Yes     HDL Cholesterol: 43 mg/dL     Total Cholesterol: 188 mg/dL    Depression screen [facility] 2/9 02/12/2020 10/05/2018 05/09/2018  Decreased Interest 0 1 3  Down, Depressed, Hopeless 0 1 1  PHQ - 2 Score 0 2 4  Altered sleeping - 2 2  Tired, decreased energy - 2 3  Change in appetite - 2 2  Feeling bad or failure about yourself  - 0 0  Trouble concentrating - 0 1  Moving slowly or fidgety/restless - 0 0  Suicidal thoughts - 0 0  PHQ-9 Score - 8 12  Difficult doing work/chores - - Somewhat difficult  Some recent data might be hidden     Social History   Tobacco Use  Smoking Status Former Smoker  . Packs/day: 0.50  . Years: 20.00  . Pack years: 10.00  . Types: Cigarettes  . Quit date: 09/10/1998  . Years since quitting: 22.2  Smokeless Tobacco Never Used  Tobacco Comment   65 yo    BP Readings from Last 3 Encounters:  11/02/20 126/74  07/30/20 (!) 150/68  07/09/20 (!) 143/78   Pulse Readings from Last 3 Encounters:  11/02/20 67  07/30/20 77  07/09/20 99   Wt Readings from Last 3 Encounters:  11/18/20 (!) 349 lb (158.3 kg)  11/02/20 (!) 349 lb 6.4 oz (158.5 kg)  10/21/20 (!) 354 lb 12.8 oz (160.9 kg)   BMI Readings from Last 3 Encounters:  11/18/20 58.08 kg/m  11/02/20 58.14 kg/m  10/21/20 59.04 kg/m    Assessment/Interventions: Review of patient past medical history, allergies, medications, health status, including review of consultants reports, laboratory and other test data, was performed as part of comprehensive evaluation and provision of chronic care management services.   SDOH:  (Social Determinants of Health) assessments and interventions performed: No  SDOH Screenings   Alcohol Screen: Not on  file  Depression (PHQ2-9): Low Risk   . PHQ-2 Score: 0  Financial Resource Strain: Low Risk   . Difficulty of Paying Living Expenses: Not hard at all  Food Insecurity: No Food Insecurity  . Worried About Charity fundraiser in the Last Year: Never true  . Ran Out of Food in the Last Year: Never true  Housing: Low Risk   . Last Housing Risk Score: 0  Physical Activity: Inactive  . Days of Exercise per Week: 0 days  . Minutes of Exercise per Session: 0 min  Social Connections: Moderately Integrated  . Frequency of Communication with Friends and Family: More than three  times a week  . Frequency of Social Gatherings with Friends and Family: Never  . Attends Religious Services: 1 to 4 times per year  . Active Member of Clubs or Organizations: No  . Attends Archivist Meetings: Never  . Marital Status: Married  Stress: No Stress Concern Present  . Feeling of Stress : Not at all  Tobacco Use: Medium Risk  . Smoking Tobacco Use: Former Smoker  . Smokeless Tobacco Use: Never Used  Transportation Needs: No Transportation Needs  . Lack of Transportation (Medical): No  . Lack of Transportation (Non-Medical): No    CCM Care Plan  Allergies  Allergen Reactions  . Ketorolac Tromethamine Shortness Of Breath and Palpitations  . Atrovent [Ipratropium] Hives and Rash  . Latex Rash and Other (See Comments)    NO POWDERED GLOVES!!!!  . Tape Rash    Medications Reviewed Today    Reviewed by Ruby Cola, RD (Registered Dietitian) on 11/19/20 at 1740  Med List Status: <None>  Medication Order Taking? Sig Documenting Provider Last Dose Status Informant  albuterol (VENTOLIN HFA) 108 (90 Base) MCG/ACT inhaler [id number] No Inhale 1-2 puffs into the lungs every 6 (six) hours as needed for wheezing or shortness of breath. [name], [name], MD Taking Active   budesonide-formoterol [facility]) 160-4.5 MCG/ACT inhaler [id number] No Inhale 2 puffs into the lungs 2 (two) times daily. [name],  [name], MD Taking Active   diclofenac (VOLTAREN) 75 MG EC tablet [id number] No Take 1 tablet (75 mg total) by mouth 2 (two) times daily as needed. [name], [name], MD Taking Active   gabapentin (NEURONTIN) 300 MG capsule [id number] No 1 cap am and noon, 2 caps at bedtime. [name], [name], MD Taking Active   hydrochlorothiazide (HYDRODIURIL) 25 MG tablet [id number] No Take 1 tablet (25 mg total) by mouth daily. [name], [name], MD Taking Active   lidocaine (LIDODERM) 5 % [id number] No Place 1 patch onto the skin daily as needed. Apply patch to area most significant pain once per day.  Remove and discard patch within 12 hours of application.  Patient taking differently: Place 1 patch onto the skin daily as needed. Apply patch to area most significant pain once per day.  Remove and discard patch within 12 hours of application.   Petrucelli, Glynda Jaeger, PA-C Taking Active   lisinopril (ZESTRIL) 40 MG tablet [id number] No Take 1 tablet (40 mg total) by mouth daily. [name], [name], MD Taking Active   methocarbamol (ROBAXIN) 500 MG tablet [id number] No Take 1 tablet (500 mg total) by mouth every 8 (eight) hours as needed for muscle spasms. Petrucelli, Glynda Jaeger, PA-C Taking Active Self  rosuvastatin (CRESTOR) 10 MG tablet [id number]  Take 1 tablet (10 mg total) by mouth daily. Elouise Munroe, MD  Active   Semaglutide,0.25 or 0.5MG/DOS, (OZEMPIC, 0.25 OR 0.5 MG/DOSE,) 2 MG/1.5ML SOPN [id number] No INJECT 0.25 MG UNDER THE SKIN ONCE A WEEK FOR 2 WEEKS, THEN INCREASE TO 0.5 MG ONCE A WEEK [name], [name], MD Taking Active           Patient Active Problem List   Diagnosis Date Noted  . Radiculopathy due to lumbar intervertebral disc disorder 12/02/2019  . Depression 12/11/2018  . Vitamin D deficiency 07/18/2018  . Prediabetes 07/18/2018  . Morbid obesity ([place]) 07/18/2018  . Insomnia 11/06/2017  . Chest pain 10/25/2017  . Asthma in adult, mild intermittent, uncomplicated 03/54/6568  . Hypertension  10/25/2017  . Obstructive sleep apnea 10/25/2017  .  Left ventricular diastolic dysfunction 23/95/3202  . Positive D dimer 10/25/2017  . Low vitamin B12 level 10/25/2017  . Depression, major, single episode, complete remission ([place]) 09/29/2017  . Pain due to total left knee replacement ([place]) 10/21/2016  . B12 nutritional deficiency 09/27/2016  . Hyperlipidemia 04/02/2012  . Lower abdominal pain 12/30/2010  . Preventative health care 09/10/2010  . LUNG NODULE 05/10/2010  . Low back pain 01/20/2009  . Generalized osteoarthritis of multiple sites (left hip,knee,and lower back) 06/02/2008  . ANEMIA-NOS 08/15/2006  . Essential hypertension 08/11/2006  . Allergic rhinitis 08/11/2006    Immunization History  Administered Date(s) Administered  . Influenza Split 03/29/2012  . Influenza,inj,Quad PF,6+ Mos 04/09/2013, 09/23/2014, 05/04/2015, 04/04/2016, 03/20/2017, 05/02/2018, 04/27/2020  . Moderna Sars-Covid-2 Vaccination 05/07/2020, 06/07/2020  . Pneumococcal Polysaccharide-23 03/29/2012, 04/04/2016  . Tdap 10/14/2010    Conditions to be addressed/monitored:  Hypertension, Hyperlipidemia, Asthma, Depression, Allergic Rhinitis and Pre-diabetes, Chronic pain  Care Plan : CCM Pharmacy Care Plan  Updates made by Viona Gilmore, [place] since 12/08/2020 12:00 AM    Problem: Problem: Hypertension, Hyperlipidemia, Asthma, Depression, Allergic Rhinitis and Pre-diabetes, Chronic pain     Long-Range Goal: Patient-Specific Goal   Start Date: 12/02/2020  Expected End Date: 12/02/2021  This Visit's Progress: On track  Priority: High  Note:   Current Barriers:  . Unable to independently monitor therapeutic efficacy  Pharmacist Clinical Goal(s):  [name] Patient will achieve adherence to monitoring guidelines and medication adherence to achieve therapeutic efficacy through collaboration with PharmD and provider.   Interventions: . 1:1 collaboration with [name], [name], MD regarding development and  update of comprehensive plan of care as evidenced by provider attestation and co-signature . Inter-disciplinary care team collaboration (see longitudinal plan of care) . Comprehensive medication review performed; medication list updated in electronic medical record  Hypertension (BP goal <140/90) -Not ideally controlled -Current treatment:  HCTZ 25 mg daily   Lisinopril 40 mg daily  -Medications previously tried: none  -Current home readings: could not provide readings -Current dietary habits: avoids salt and doesn't use it to season or cook with at all -Current exercise habits: nothing structured -Denies hypotensive/hypertensive symptoms -Educated on Exercise goal of 150 minutes per week; Importance of home blood pressure monitoring; Proper BP monitoring technique; -Counseled to monitor BP at home weekly, document, and provide log at future appointments -Counseled on diet and exercise extensively Recommended to continue current medication  Hyperlipidemia: (LDL goal < 100) -Uncontrolled -Current treatment: . Rosuvastatin 10 mg daily -Medications previously tried: none  -Current dietary patterns: did not discuss -Current exercise habits: was doing some chair exercises but not often -Educated on Cholesterol goals;  Benefits of statin for ASCVD risk reduction; Importance of limiting foods high in cholesterol; Exercise goal of 150 minutes per week; -Counseled on diet and exercise extensively Recommended to continue current medication  Pre-diabetes/weight loss (A1c goal <6.5%) -Controlled -Current medications: . Ozempic 0.5 mg inject once weekly -Medications previously tried: none  -Current home glucose readings . fasting glucose: does not need to check . post prandial glucose: does not need to check -Denies hypoglycemic/hyperglycemic symptoms -Current meal patterns:  . breakfast: did not discuss  . lunch: did not discuss   . dinner: did not discuss  . snacks: did not  discuss  . drinks: did not discuss  -Current exercise: was doing chair exercises but does not do often -Educated on A1c and blood sugar goals; Exercise goal of 150 minutes per week; Carbohydrate counting and/or plate method -Counseled to check  feet daily and get yearly eye exams -Counseled on diet and exercise extensively Recommended to continue current medication Patient requested a dose increase as her weight is at a standstill. Sent message to PCP.  Asthma (Goal: control symptoms) -Not ideally controlled -Current treatment  . Albuterol as needed . Symbicort 160-4.5 mcg/act 2 puffs twice daily -Medications previously tried: none  -Pulmonary function testing: n/a -Patient denies consistent use of maintenance inhaler -Frequency of rescue inhaler use: not often -Counseled on Benefits of consistent maintenance inhaler use When to use rescue inhaler Differences between maintenance and rescue inhalers -Recommended to use Symbicort daily as prescribed. Patient was worried about weight gain given steroid component but discussed low risk due to travel to the lungs  GERD (Goal: minimize symptoms) -Controlled -Current treatment  . Pantoprazole 40 mg daily as needed -Medications previously tried: none  -Recommended to continue current medication   Chronic pain (Goal: minimize pain) -Controlled -Current treatment   Diclofenac 75 mg BID PRN (Ran out)   Gabapentin 300 mg 1 cap AM, 1 cap Noon, 2 cap HS (sometimes will take 2nd capsule at lunch)   Hydrocodone-APAP 5-325 QID PRN (Rarely uses - only if runs out of other medications)   Lidocaine 5% patch daily PRN (falls off)   Methocarbamol 500 mg q8hr PRN (hasn't used)   Tizanidine 2 mg TID PRN (twice daily)   Tramadol 50 mg BID PRN (hasn't used in 1-2 weeks) -Medications previously tried: n/a  - Discussed at length the importance of combining medications whenever possible to minimize risk of CNS depressants. Discussed avoiding  methocarbamol and tizanidine due to duplication of therapy.   Health Maintenance -Vaccine gaps: shingrix, tetanus -Current therapy:  . No medications -Educated on Cost vs benefit of each product must be carefully weighed by individual consumer -Patient is satisfied with current therapy and denies issues -Recommended to continue as is  Patient Goals/Self-Care Activities . Patient will:  - take medications as prescribed check blood pressure weekly, document, and provide at future appointments target a minimum of 150 minutes of moderate intensity exercise weekly  Follow Up Plan: Telephone follow up appointment with care management team member scheduled for: 6 months       Medication Assistance: None required.  Patient affirms current coverage meets needs.  Compliance/Adherence/Medication fill history: Care Gaps: None  Star-Rating Drugs: Rosuvastatin 10 mg - dispensed 11/02/20 for 90 ds at [facility] Lisinopril 40 mg - dispensed 12/02/20 for 90 DS at [facility]  Patient's preferred pharmacy is:  [facility] [place], [place] [address] Phone: [phone] Fax: [phone]  [facility] [place], [place] [address] [place] [address] Phone: [phone] Fax: [phone]  Uses pill box? Yes Pt endorses 100% compliance  We discussed: Current pharmacy is preferred with insurance plan and patient is satisfied with pharmacy services Patient decided to: Continue current medication management strategy  Care Plan and Follow Up Patient Decision:  Patient agrees to Care Plan and Follow-up.  Plan: Telephone follow up appointment with care management team member scheduled for:  6 months  Jeni Salles, PharmD [place] Pharmacist [place] at [facility] [phone]

## 2020-12-03 DIAGNOSIS — I10 Essential (primary) hypertension: Secondary | ICD-10-CM | POA: Diagnosis not present

## 2020-12-03 DIAGNOSIS — G4733 Obstructive sleep apnea (adult) (pediatric): Secondary | ICD-10-CM | POA: Diagnosis not present

## 2020-12-08 NOTE — Patient Instructions (Addendum)
Hi [name],  It was great to get to meet you over the telephone! Below is a summary of some of the topics we discussed. Keep on working on making some of those diet and exercise changes to lower your cholesterol.  Please reach out to me if you have any questions or need anything before our follow up!  Best, Maddie  Gaylord Shih, PharmD, [facility] Clinical Pharmacist [facility] [phone]  Visit Information  Goals Addressed   None    Patient Care Plan: CCM Pharmacy Care Plan    Problem Identified: Problem: Hypertension, Hyperlipidemia, Asthma, Depression, Allergic Rhinitis and Pre-diabetes, Chronic pain     Long-Range Goal: Patient-Specific Goal   Start Date: 12/02/2020  Expected End Date: 12/02/2021  This Visit's Progress: On track  Priority: High  Note:   Current Barriers:  . Unable to independently monitor therapeutic efficacy  Pharmacist Clinical Goal(s):  [name] Patient will achieve adherence to monitoring guidelines and medication adherence to achieve therapeutic efficacy through collaboration with PharmD and provider.   Interventions: . 1:1 collaboration with [name], [name], MD regarding development and update of comprehensive plan of care as evidenced by provider attestation and co-signature . Inter-disciplinary care team collaboration (see longitudinal plan of care) . Comprehensive medication review performed; medication list updated in electronic medical record  Hypertension (BP goal <140/90) -Not ideally controlled -Current treatment:  HCTZ 25 mg daily   Lisinopril 40 mg daily  -Medications previously tried: none  -Current home readings: could not provide readings -Current dietary habits: avoids salt and doesn't use it to season or cook with at all -Current exercise habits: nothing structured -Denies hypotensive/hypertensive symptoms -Educated on Exercise goal of 150 minutes per week; Importance of home blood pressure monitoring; Proper  BP monitoring technique; -Counseled to monitor BP at home weekly, document, and provide log at future appointments -Counseled on diet and exercise extensively Recommended to continue current medication  Hyperlipidemia: (LDL goal < 100) -Uncontrolled -Current treatment: . Rosuvastatin 10 mg daily -Medications previously tried: none  -Current dietary patterns: did not discuss -Current exercise habits: was doing some chair exercises but not often -Educated on Cholesterol goals;  Benefits of statin for ASCVD risk reduction; Importance of limiting foods high in cholesterol; Exercise goal of 150 minutes per week; -Counseled on diet and exercise extensively Recommended to continue current medication  Pre-diabetes/weight loss (A1c goal <6.5%) -Controlled -Current medications: . Ozempic 0.5 mg inject once weekly -Medications previously tried: none  -Current home glucose readings . fasting glucose: does not need to check . post prandial glucose: does not need to check -Denies hypoglycemic/hyperglycemic symptoms -Current meal patterns:  . breakfast: did not discuss  . lunch: did not discuss   . dinner: did not discuss  . snacks: did not discuss  . drinks: did not discuss  -Current exercise: was doing chair exercises but does not do often -Educated on A1c and blood sugar goals; Exercise goal of 150 minutes per week; Carbohydrate counting and/or plate method -Counseled to check feet daily and get yearly eye exams -Counseled on diet and exercise extensively Recommended to continue current medication Patient requested a dose increase as her weight is at a standstill. Sent message to PCP.  Asthma (Goal: control symptoms) -Not ideally controlled -Current treatment  . Albuterol as needed . Symbicort 160-4.5 mcg/act 2 puffs twice daily -Medications previously tried: none  -Pulmonary function testing: n/a -Patient denies consistent use of maintenance inhaler -Frequency of rescue  inhaler use: not often -Counseled on Benefits of consistent  maintenance inhaler use When to use rescue inhaler Differences between maintenance and rescue inhalers -Recommended to use Symbicort daily as prescribed. Patient was worried about weight gain given steroid component but discussed low risk due to travel to the lungs  GERD (Goal: minimize symptoms) -Controlled -Current treatment  . Pantoprazole 40 mg daily as needed -Medications previously tried: none  -Recommended to continue current medication   Chronic pain (Goal: minimize pain) -Controlled -Current treatment   Diclofenac 75 mg BID PRN (Ran out)   Gabapentin 300 mg 1 cap AM, 1 cap Noon, 2 cap HS (sometimes will take 2nd capsule at lunch)   Hydrocodone-APAP 5-325 QID PRN (Rarely uses - only if runs out of other medications)   Lidocaine 5% patch daily PRN (falls off)   Methocarbamol 500 mg q8hr PRN (hasn't used)   Tizanidine 2 mg TID PRN (twice daily)   Tramadol 50 mg BID PRN (hasn't used in 1-2 weeks) -Medications previously tried: n/a  - Discussed at length the importance of combining medications whenever possible to minimize risk of CNS depressants. Discussed avoiding methocarbamol and tizanidine due to duplication of therapy.   Health Maintenance -Vaccine gaps: shingrix, tetanus -Current therapy:  . No medications -Educated on Cost vs benefit of each product must be carefully weighed by individual consumer -Patient is satisfied with current therapy and denies issues -Recommended to continue as is  Patient Goals/Self-Care Activities . Patient will:  - take medications as prescribed check blood pressure weekly, document, and provide at future appointments target a minimum of 150 minutes of moderate intensity exercise weekly  Follow Up Plan: Telephone follow up appointment with care management team member scheduled for: 6 months       Patient verbalizes understanding of instructions provided today and  agrees to view in MyChart.  Telephone follow up appointment with pharmacy team member scheduled for: 6 months  Verner Chol, [facility]  High Cholesterol  High cholesterol is a condition in which the blood has high levels of a white, waxy substance similar to fat (cholesterol). The liver makes all the cholesterol that the body needs. The human body needs small amounts of cholesterol to help build cells. A person gets extra or excess cholesterol from the food that he or she eats. The blood carries cholesterol from the liver to the rest of the body. If you have high cholesterol, deposits (plaques) may build up on the walls of your arteries. Arteries are the blood vessels that carry blood away from your heart. These plaques make the arteries narrow and stiff. Cholesterol plaques increase your risk for heart attack and stroke. Work with your health care provider to keep your cholesterol levels in a healthy range. What increases the risk? The following factors may make you more likely to develop this condition:  Eating foods that are high in animal fat (saturated fat) or cholesterol.  Being overweight.  Not getting enough exercise.  A family history of high cholesterol (familial hypercholesterolemia).  Use of tobacco products.  Having diabetes. What are the signs or symptoms? There are no symptoms of this condition. How is this diagnosed? This condition may be diagnosed based on the results of a blood test.  If you are older than 64 years of age, your health care provider may check your cholesterol levels every 4-6 years.  You may be checked more often if you have high cholesterol or other risk factors for heart disease. The blood test for cholesterol measures:  "Bad" cholesterol, or LDL cholesterol. This is  the main type of cholesterol that causes heart disease. The desired level is less than 100 mg/dL.  "Good" cholesterol, or HDL cholesterol. HDL helps protect against heart disease by  cleaning the arteries and carrying the LDL to the liver for processing. The desired level for HDL is 60 mg/dL or higher.  Triglycerides. These are fats that your body can store or burn for energy. The desired level is less than 150 mg/dL.  Total cholesterol. This measures the total amount of cholesterol in your blood and includes LDL, HDL, and triglycerides. The desired level is less than 200 mg/dL. How is this treated? This condition may be treated with:  Diet changes. You may be asked to eat foods that have more fiber and less saturated fats or added sugar.  Lifestyle changes. These may include regular exercise, maintaining a healthy weight, and quitting use of tobacco products.  Medicines. These are given when diet and lifestyle changes have not worked. You may be prescribed a statin medicine to help lower your cholesterol levels. Follow these instructions at home: Eating and drinking  Eat a healthy, balanced diet. This diet includes: ? Daily servings of a variety of fresh, frozen, or canned fruits and vegetables. ? Daily servings of whole grain foods that are rich in fiber. ? Foods that are low in saturated fats and trans fats. These include poultry and fish without skin, lean cuts of meat, and low-fat dairy products. ? A variety of fish, especially oily fish that contain omega-3 fatty acids. Aim to eat fish at least 2 times a week.  Avoid foods and drinks that have added sugar.  Use healthy cooking methods, such as roasting, grilling, broiling, baking, poaching, steaming, and stir-frying. Do not fry your food except for stir-frying.   Lifestyle  Get regular exercise. Aim to exercise for a total of 150 minutes a week. Increase your activity level by doing activities such as gardening, walking, and taking the stairs.  Do not use any products that contain nicotine or tobacco, such as cigarettes, e-cigarettes, and chewing tobacco. If you need help quitting, ask your health care  provider.   General instructions  Take over-the-counter and prescription medicines only as told by your health care provider.  Keep all follow-up visits as told by your health care provider. This is important. Where to find more information  American Heart Association: www.heart.org  National Heart, Lung, and Blood Institute: PopSteam.is Contact a health care provider if:  You have trouble achieving or maintaining a healthy diet or weight.  You are starting an exercise program.  You are unable to stop smoking. Get help right away if:  You have chest pain.  You have trouble breathing.  You have any symptoms of a stroke. "BE FAST" is an easy way to remember the main warning signs of a stroke: ? B - Balance. Signs are dizziness, sudden trouble walking, or loss of balance. ? E - Eyes. Signs are trouble seeing or a sudden change in vision. ? F - Face. Signs are sudden weakness or numbness of the face, or the face or eyelid drooping on one side. ? A - Arms. Signs are weakness or numbness in an arm. This happens suddenly and usually on one side of the body. ? S - Speech. Signs are sudden trouble speaking, slurred speech, or trouble understanding what people say. ? T - Time. Time to call emergency services. Write down what time symptoms started.  You have other signs of a stroke, such as: ?  A sudden, severe headache with no known cause. ? Nausea or vomiting. ? Seizure. These symptoms may represent a serious problem that is an emergency. Do not wait to see if the symptoms will go away. Get medical help right away. Call your local emergency services (911 in the [place] Do not drive yourself to the hospital. Summary  Cholesterol plaques increase your risk for heart attack and stroke. Work with your health care provider to keep your cholesterol levels in a healthy range.  Eat a healthy, balanced diet, get regular exercise, and maintain a healthy weight.  Do not use any products  that contain nicotine or tobacco, such as cigarettes, e-cigarettes, and chewing tobacco.  Get help right away if you have any symptoms of a stroke. This information is not intended to replace advice given to you by your health care provider. Make sure you discuss any questions you have with your health care provider. Document Revised: 05/27/2019 Document Reviewed: 05/27/2019 Elsevier Patient Education  2021 [employer]

## 2020-12-15 ENCOUNTER — Ambulatory Visit: Payer: Medicare Other | Admitting: Skilled Nursing Facility1

## 2020-12-23 ENCOUNTER — Other Ambulatory Visit: Payer: Self-pay

## 2020-12-23 ENCOUNTER — Encounter: Payer: Medicare Other | Attending: Surgery | Admitting: Skilled Nursing Facility1

## 2020-12-23 DIAGNOSIS — G4730 Sleep apnea, unspecified: Secondary | ICD-10-CM | POA: Diagnosis not present

## 2020-12-23 DIAGNOSIS — R7303 Prediabetes: Secondary | ICD-10-CM | POA: Diagnosis not present

## 2020-12-23 DIAGNOSIS — Z96652 Presence of left artificial knee joint: Secondary | ICD-10-CM | POA: Diagnosis not present

## 2020-12-23 DIAGNOSIS — M25562 Pain in left knee: Secondary | ICD-10-CM | POA: Diagnosis not present

## 2020-12-23 DIAGNOSIS — M25552 Pain in left hip: Secondary | ICD-10-CM | POA: Diagnosis not present

## 2020-12-23 DIAGNOSIS — Z713 Dietary counseling and surveillance: Secondary | ICD-10-CM | POA: Insufficient documentation

## 2020-12-23 DIAGNOSIS — M1612 Unilateral primary osteoarthritis, left hip: Secondary | ICD-10-CM | POA: Diagnosis not present

## 2020-12-23 NOTE — Progress Notes (Signed)
Supervised Weight Loss Visit Bariatric Nutrition Education  Planned Surgery: sleeve  4 out of 6 SWL Appointments    NUTRITION ASSESSMENT   Anthropometrics  Start weight at [facility]: 352.6 lbs (date: 08/24/2020)  Weight: 355.5 Height: 65 in BMI: 59.04 kg/m2     Clinical  Medical hx: Prediabetes, sleep apnea Medications: ozempic Labs: vitamin B12 (111) vitamin D (18.42) Notable signs/symptoms: limited mobility due to hip pain Any previous deficiencies? Yes, vitamin B12 , vitamin D    Lifestyle & Dietary Hx  Pt arrived in a wheel chair pushed by her husband daughter.  Pts husband makes and serves meals.   Pt states she does not get up at all throughout the day and keeps a potty chair next to her at home so she does not have to walk to the rest room stating this as why she cannot eat vegetables.   Estimated daily fluid intake: oz Supplements: Current average weekly physical activity:   24-Hr Dietary Recall First Meal: cereal  Snack: yogurt + granola  Second Meal: pear + sausage dog in bun + french fries Snack: fruit Third Meal:  taco salad Snack: ice cream Beverages: water, lessened orange juice, coffee + sugar, tea  Estimated Energy Needs Calories: 1500   NUTRITION DIAGNOSIS  Overweight/obesity ([place]-3.3) related to past poor dietary habits and physical inactivity as evidenced by patient w/ planned sleeve surgery following dietary guidelines for continued weight loss.   NUTRITION INTERVENTION  Nutrition counseling (C-1) and education (E-2) to facilitate bariatric surgery goals. Educated pts husband on putting together balanced meals  Pre-Op Goals Progress & New Goals Track everything you eat and drink Aim for a minimum 64 oz fluid per day continue: measure out cereal with measuring cup 1 cup and half banana (did not do this) continue: Aim for a minimum 1 cup of non starchy vegetables  2 times a day 7 days a week continue: do not add bacon grease to your  foods continue: add protein to your fruit and crackers such as dried edamame  continue: cut out orange juice  continue: limit your fruit to 3 servings per day NEW: do not add any sugar to coffee NEW: keep prepped veggies in the fridge  Handouts Previously Provided Include  Detailed MyPlate  Learning Style & Readiness for Change Teaching method utilized: Visual & Auditory  Demonstrated degree of understanding via: Teach Back  Readiness Level: pre contemplative  Barriers to learning/adherence to lifestyle change: none identified   RD's Notes for next Visit  Assess pts adherence to chosen goals    MONITORING & EVALUATION Dietary intake, weekly physical activity, body weight, and pre-op goals in 1 month.   Next Steps  Patient is to return to [facility] in 1 month

## 2020-12-29 ENCOUNTER — Ambulatory Visit (INDEPENDENT_AMBULATORY_CARE_PROVIDER_SITE_OTHER): Payer: Medicare Other

## 2020-12-29 DIAGNOSIS — E538 Deficiency of other specified B group vitamins: Secondary | ICD-10-CM | POA: Diagnosis not present

## 2020-12-29 MED ORDER — CYANOCOBALAMIN 1000 MCG/ML IJ SOLN
1000.0000 ug | Freq: Once | INTRAMUSCULAR | Status: AC
  Administered 2020-12-29: 1000 ug via INTRAMUSCULAR

## 2020-12-29 NOTE — Progress Notes (Signed)
Per orders from Dr. [name], pt was given a B12 injection by Steffanie Rainwater, tolerated well.

## 2021-01-12 ENCOUNTER — Encounter: Payer: Medicare Other | Attending: Surgery | Admitting: Skilled Nursing Facility1

## 2021-01-12 ENCOUNTER — Other Ambulatory Visit: Payer: Self-pay

## 2021-01-12 DIAGNOSIS — Z6841 Body Mass Index (BMI) 40.0 and over, adult: Secondary | ICD-10-CM | POA: Insufficient documentation

## 2021-01-12 DIAGNOSIS — G4730 Sleep apnea, unspecified: Secondary | ICD-10-CM | POA: Diagnosis not present

## 2021-01-12 DIAGNOSIS — R7303 Prediabetes: Secondary | ICD-10-CM | POA: Diagnosis not present

## 2021-01-12 NOTE — Progress Notes (Signed)
Supervised Weight Loss Visit Bariatric Nutrition Education  Planned Surgery: sleeve  5 out of 6 SWL Appointments    NUTRITION ASSESSMENT   Anthropometrics  Start weight at [facility]: 352.6 lbs (date: 08/24/2020)  Weight: 350.5 Height: 65 in BMI: 58.33 kg/m2     Clinical  Medical hx: Prediabetes, sleep apnea Medications: ozempic Labs: vitamin B12 (111) vitamin D (18.42) Notable signs/symptoms: limited mobility due to hip pain Any previous deficiencies? Yes, vitamin B12 , vitamin D    Lifestyle & Dietary Hx  Pt arrived in a wheel chair pushed by her husband or daughter.  Pts husband makes and serves meals.   Pt states husband and daughter prep her veggies to keep in the fridge. Pt states since adding in her veggies she has had more productive bowel movement and feeling less bloated.    Estimated daily fluid intake: oz Supplements: Current average weekly physical activity:   24-Hr Dietary Recall First Meal: cereal  Snack: fruit Second Meal: chicken cesar salad bought out + dried cranberries Snack: fruit Third Meal: steak + lettuce + tomato  Snack: ice cream Beverages: water, coffee  Estimated Energy Needs Calories: 1500   NUTRITION DIAGNOSIS  Overweight/obesity ([place]-3.3) related to past poor dietary habits and physical inactivity as evidenced by patient w/ planned sleeve surgery following dietary guidelines for continued weight loss.   NUTRITION INTERVENTION  Nutrition counseling (C-1) and education (E-2) to facilitate bariatric surgery goals. Educated pts husband on putting together balanced meals  Pre-Op Goals Progress & New Goals Track everything you eat and drink Aim for a minimum 64 oz fluid per day continue: measure out cereal with measuring cup 1 cup and half banana (did not do this) continue: Aim for a minimum 1 cup of non starchy vegetables  2 times a day 7 days a week continue: do not add bacon grease to your foods continue: add protein to your fruit  and crackers such as dried edamame  continue: cut out orange juice  continue: limit your fruit to 3 servings per day continue: do not add any sugar to coffee continue: keep prepped veggies in the fridge NEW: aim to walk more with your roller walker in the house/leave the bedroom  Handouts Previously Provided Include  Detailed MyPlate  Learning Style & Readiness for Change Teaching method utilized: Visual & Auditory  Demonstrated degree of understanding via: Teach Back  Readiness Level: pre contemplative  Barriers to learning/adherence to lifestyle change: none identified   RD's Notes for next Visit  Assess pts adherence to chosen goals    MONITORING & EVALUATION Dietary intake, weekly physical activity, body weight, and pre-op goals in 1 month.   Next Steps  Patient is to return to [facility] in 1 month

## 2021-01-19 ENCOUNTER — Other Ambulatory Visit: Payer: Self-pay | Admitting: Family Medicine

## 2021-01-19 ENCOUNTER — Telehealth: Payer: Self-pay | Admitting: Pharmacist

## 2021-01-19 DIAGNOSIS — M159 Polyosteoarthritis, unspecified: Secondary | ICD-10-CM

## 2021-01-19 DIAGNOSIS — M5116 Intervertebral disc disorders with radiculopathy, lumbar region: Secondary | ICD-10-CM

## 2021-01-19 NOTE — Chronic Care Management (AMB) (Signed)
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   budesonide-formoterol (SYMBICORT) 160-4.5 MCG/ACT inhaler Inhale 2 puffs into the lungs 2 (two) times daily.   diclofenac (VOLTAREN) 75 MG EC tablet TAKE 1 TABLET BY MOUTH TWICE DAILY AS NEEDED   gabapentin (NEURONTIN) 300 MG capsule 1 cap am and noon, 2 caps at bedtime.   hydrochlorothiazide (HYDRODIURIL) 25 MG tablet Take 1 tablet (25 mg total) by mouth daily.   lidocaine (LIDODERM) 5 % Place 1 patch onto the skin daily as needed. Apply patch to area most significant pain once per day.  Remove and discard patch within 12 hours of application. (Patient taking differently: Place 1 patch onto the skin daily as needed. Apply patch to area most significant pain once per day.  Remove and discard patch within 12 hours of application.)   lisinopril (ZESTRIL) 40 MG tablet Take 1 tablet (40 mg total) by mouth daily.   methocarbamol (ROBAXIN) 500 MG tablet Take 1 tablet (500 mg total) by mouth every 8 (eight) hours as needed for muscle spasms.   rosuvastatin (CRESTOR) 10 MG tablet Take 1 tablet (10 mg total) by mouth daily.   Semaglutide,0.25 or 0.5MG /DOS, (OZEMPIC, 0.25 OR 0.5 MG/DOSE,) 2 MG/1.5ML SOPN INJECT 0.25 MG UNDER THE SKIN ONCE A WEEK FOR 2 WEEKS, THEN INCREASE TO 0.5 MG ONCE A WEEK   No facility-administered encounter medications on file as of 01/19/2021.   Reviewed chart  prior to disease state call. Spoke with patient regarding BP  Recent Office Vitals: BP Readings from Last 3 Encounters:  11/02/20 126/74  07/30/20 (!) 150/68  07/09/20 (!) 143/78   Pulse Readings from Last 3 Encounters:  11/02/20 67  07/30/20 77  07/09/20 99    Wt Readings from Last 3 Encounters:  01/12/21 (!) 350 lb 8 oz (159 kg)  11/18/20 (!) 349 lb (158.3 kg)  11/02/20 (!) 349 lb 6.4 oz (158.5 kg)     Kidney Function Lab Results  Component Value Date/Time   CREATININE 0.85 10/27/2020 03:47 PM   CREATININE 0.80 07/09/2020 02:12 PM   CREATININE 0.83 04/27/2020 01:07 PM   CREATININE 0.74 09/15/2017 03:19 PM   GFR 104.08 12/02/2019 09:13 AM   GFRNONAA >60 07/09/2020 02:12 PM   GFRNONAA 75 04/27/2020 01:07 PM   GFRAA 87 04/27/2020 01:07 PM    BMP Latest Ref Rng & Units 10/27/2020 07/09/2020 04/27/2020  Glucose 65 - 99 mg/dL 89 93 829(F)  BUN 8 - 27 mg/dL 14 8 13   Creatinine 0.57 - 1.00 mg/dL 6.21 3.08  BUN/Creat Ratio 12 - 28 16 - NOT APPLICABLE  Sodium 134 - 144 mmol/L 141 140 146  Potassium 3.5 - 5.2 mmol/L 4.3 3.8 4.5  Chloride 96 - 106 mmol/L 101 108 110  CO2 20 - 29 mmol/L 25 22 18(L)  Calcium 8.7 - 10.3 mg/dL 9.9 9.6 6.57   Current antihypertensive regimen:  HCTZ 25 mg daily Lisinopril 40 mg daily  How often are you checking your Blood Pressure? 3-5x per week Current home BP readings:  07.14 128/80 07.15 130/80 07.17 126/86 07.19 126/74 07.20 128/70 What recent interventions/DTPs have been made by any provider to improve Blood Pressure control since last CPP Visit: None Any recent hospitalizations or ED visits since last visit with CPP? No What diet changes have been made to improve Blood Pressure Control?  Eats fewer carbs and increase water intake What exercise is being done to improve your Blood Pressure Control?  Walking slightly more  Adherence Review: Is the patient currently on ACE/ARB medication? Yes Does the patient have >5 day gap  between last estimated fill dates? No  I spoke with the patient about medication adherence. She states that she has been doing fair recently. She continues to check her blood pressure 3-5 times a week. She stated that she recently started getting hip injections to help with her left hip pain. She states that she is not experiencing any side effects from her current medications. There have been no recent falls or injuries. She states that she has been working on her diet eating lower carbs and increasing her water intake. She states that she is trying to exercise more during the week. There have been no emergency department, hospital visits, or urgent care visits since her last CPP or PCP visit. she currently uses [facility] on [address], and She does not have any issues. Her next CCM appointment is for November 2022.  Care Gaps: Zoster Vaccines Tetanus COVID-19 Mammogram  Star Rating Drugs: Medication Dispensed Quantity Pharmacy  Rosuvastatin 10 mg 04.25.2022 90 [facility]  Lisinopril 40 mg 05.25.2022 90 [facility]   Amilia Tomma Rakers, [facility] Clinical Pharmacist Assistant [phone]

## 2021-01-27 DIAGNOSIS — M1612 Unilateral primary osteoarthritis, left hip: Secondary | ICD-10-CM | POA: Diagnosis not present

## 2021-01-27 DIAGNOSIS — M25562 Pain in left knee: Secondary | ICD-10-CM | POA: Diagnosis not present

## 2021-01-27 DIAGNOSIS — Z96652 Presence of left artificial knee joint: Secondary | ICD-10-CM | POA: Diagnosis not present

## 2021-01-28 ENCOUNTER — Other Ambulatory Visit: Payer: Self-pay | Admitting: Orthopedic Surgery

## 2021-01-28 DIAGNOSIS — M1612 Unilateral primary osteoarthritis, left hip: Secondary | ICD-10-CM

## 2021-02-02 ENCOUNTER — Ambulatory Visit: Payer: Medicare Other

## 2021-02-05 ENCOUNTER — Other Ambulatory Visit: Payer: Medicare Other

## 2021-02-08 ENCOUNTER — Other Ambulatory Visit: Payer: Self-pay

## 2021-02-08 ENCOUNTER — Telehealth: Payer: Self-pay | Admitting: Pharmacist

## 2021-02-08 ENCOUNTER — Ambulatory Visit
Admission: RE | Admit: 2021-02-08 | Discharge: 2021-02-08 | Disposition: A | Discharge status: Home / Self Care | Payer: Medicare Other | Source: Ambulatory Visit | Attending: Orthopedic Surgery | Admitting: Orthopedic Surgery

## 2021-02-08 DIAGNOSIS — M1612 Unilateral primary osteoarthritis, left hip: Secondary | ICD-10-CM | POA: Diagnosis not present

## 2021-02-08 MED ORDER — IOPAMIDOL (ISOVUE-M 200) INJECTION 41%
1.0000 mL | Freq: Once | INTRAMUSCULAR | Status: AC
  Administered 2021-02-08: 1 mL via INTRA_ARTICULAR

## 2021-02-08 MED ORDER — METHYLPREDNISOLONE ACETATE 40 MG/ML INJ SUSP (RADIOLOG
80.0000 mg | Freq: Once | INTRAMUSCULAR | Status: AC
  Administered 2021-02-08: 80 mg via INTRA_ARTICULAR

## 2021-02-08 NOTE — Chronic Care Management (AMB) (Addendum)
Chronic Care Management Pharmacy Assistant   Name: Kittie Krizan  MRN: [id number] DOB: 02/25/1956   Reason for Encounter: Disease State/ Hypertension Assessment Call   Conditions to be addressed/monitored: HTN  Recent office visits:  None  Recent consult visits:  None  Hospital visits:  None in previous 6 months  Medications: Outpatient Encounter Medications as of 02/08/2021  Medication Sig   albuterol (VENTOLIN HFA) 108 (90 Base) MCG/ACT inhaler Inhale 1-2 puffs into the lungs every 6 (six) hours as needed for wheezing or shortness of breath.   budesonide-formoterol (SYMBICORT) 160-4.5 MCG/ACT inhaler Inhale 2 puffs into the lungs 2 (two) times daily.   diclofenac (VOLTAREN) 75 MG EC tablet TAKE 1 TABLET BY MOUTH TWICE DAILY AS NEEDED   gabapentin (NEURONTIN) 300 MG capsule 1 cap am and noon, 2 caps at bedtime.   hydrochlorothiazide (HYDRODIURIL) 25 MG tablet Take 1 tablet (25 mg total) by mouth daily.   lidocaine (LIDODERM) 5 % Place 1 patch onto the skin daily as needed. Apply patch to area most significant pain once per day.  Remove and discard patch within 12 hours of application. (Patient taking differently: Place 1 patch onto the skin daily as needed. Apply patch to area most significant pain once per day.  Remove and discard patch within 12 hours of application.)   lisinopril (ZESTRIL) 40 MG tablet Take 1 tablet (40 mg total) by mouth daily.   methocarbamol (ROBAXIN) 500 MG tablet Take 1 tablet (500 mg total) by mouth every 8 (eight) hours as needed for muscle spasms.   rosuvastatin (CRESTOR) 10 MG tablet Take 1 tablet (10 mg total) by mouth daily.   Semaglutide,0.25 or 0.5MG /DOS, (OZEMPIC, 0.25 OR 0.5 MG/DOSE,) 2 MG/1.5ML SOPN INJECT 0.25 MG UNDER THE SKIN ONCE A WEEK FOR 2 WEEKS, THEN INCREASE TO 0.5 MG ONCE A WEEK   No facility-administered encounter medications on file as of 02/08/2021.  Reviewed chart prior to disease state call. Spoke with patient regarding  BP  Recent Office Vitals: BP Readings from Last 3 Encounters:  11/02/20 126/74  07/30/20 (!) 150/68  07/09/20 (!) 143/78   Pulse Readings from Last 3 Encounters:  11/02/20 67  07/30/20 77  07/09/20 99    Wt Readings from Last 3 Encounters:  01/12/21 (!) 350 lb 8 oz (159 kg)  11/18/20 (!) 349 lb (158.3 kg)  11/02/20 (!) 349 lb 6.4 oz (158.5 kg)     Kidney Function Lab Results  Component Value Date/Time   CREATININE 0.85 10/27/2020 03:47 PM   CREATININE 0.80 07/09/2020 02:12 PM   CREATININE 0.83 04/27/2020 01:07 PM   CREATININE 0.74 09/15/2017 03:19 PM   GFR 104.08 12/02/2019 09:13 AM   GFRNONAA >60 07/09/2020 02:12 PM   GFRNONAA 75 04/27/2020 01:07 PM   GFRAA 87 04/27/2020 01:07 PM    BMP Latest Ref Rng & Units 10/27/2020 07/09/2020 04/27/2020  Glucose 65 - 99 mg/dL 89 93 110(Y)  BUN 8 - 27 mg/dL 14 8 13   Creatinine 0.57 - 1.00 mg/dL 1.11 7.35  BUN/Creat Ratio 12 - 28 16 - NOT APPLICABLE  Sodium 134 - 144 mmol/L 141 140 146  Potassium 3.5 - 5.2 mmol/L 4.3 3.8 4.5  Chloride 96 - 106 mmol/L 101 108 110  CO2 20 - 29 mmol/L 25 22 18(L)  Calcium 8.7 - 10.3 mg/dL 9.9 9.6 6.70    Current antihypertensive regimen:  HCTZ 25 mg one tab daily Lisinopril 40 mg one tab  daily   How often  are you checking your Blood Pressure? infrequently  Current home BP readings: Patient reports she checks her pressure once a week has not this week yet but last week was 130/76.  What recent interventions/DTPs have been made by any provider to improve Blood Pressure control since last CPP Visit: Patient reports none.  Any recent hospitalizations or ED visits since last visit with CPP? No  What diet changes have been made to improve Blood Pressure Control?  Patient reports for breakfast she usually has cereal and coffee and water, for Lunch soup and salad and for Dinner salad, starch and a meat like baked chicken. What exercise is being done to improve your Blood Pressure Control?   Patient reports she is often in pain in her hip but when she is not hurting does do walking and stretches.  Adherence Review: Is the patient currently on ACE/ARB medication? Yes Does the patient have >5 day gap between last estimated fill dates? No  Care Gaps:  AWV - scheduled 02-17-2021 1pm CCM F/U call - scheduled [phone] 2pm Zoster vaccine - Overdue TDAP - Overdue COVID Booster #3 (Moderna) - Overdue Mammogram - Overdue Flu Vaccine - Overdue  Star Rating Drugs:  Rosuvastatin 10 mg - Last filled 11-02-20 90DS at [facility] Lisinopril 40 mg - Last filled 12-02-20 90DS at [facility] Semaglutide 0.25 mg - Last filled 01-12-2021 30DS at [facility]  Pamala Duffel [facility] Clinical Pharmacist Assistant [phone]

## 2021-02-09 ENCOUNTER — Encounter: Payer: Medicare Other | Attending: Surgery | Admitting: Skilled Nursing Facility1

## 2021-02-09 NOTE — Progress Notes (Signed)
Supervised Weight Loss Visit Bariatric Nutrition Education  Planned Surgery: sleeve  6 out of 6 SWL Appointments   Pt completed visits.   Pt has cleared nutrition requirements.    NUTRITION ASSESSMENT   Anthropometrics  Start weight at [facility]: 352.6 lbs (date: 08/24/2020)  Weight: 348.5 pounds Height: 65 in BMI: 57.99 kg/m2     Clinical  Medical hx: Prediabetes, sleep apnea Medications: ozempic Labs: vitamin B12 (111) vitamin D (18.42) Notable signs/symptoms: limited mobility due to hip pain Any previous deficiencies? Yes, vitamin B12 , vitamin D    Lifestyle & Dietary Hx  Pt arrived in a wheel chair pushed by her husband or daughter.  Pts husband makes and serves meals.   Pt states she has been more active at home trying to sit up more and keep out of her bed.   Pt states over the last 6 months she has learned to cut back on her portions, eat more non starchy vegetables, and eating less sugar and increasing her water consumption.   Pt states she thinks maybe she will struggle with staying the course after surgery.     Estimated daily fluid intake: oz Supplements: Current average weekly physical activity: Trying to move more around the house  24-Hr Dietary Recall First Meal: cereal  Snack: fruit Second Meal: soup + salad  Snack: fruit or trailmix  Third Meal: greens + pork chop + mashed potatoes Snack: ice cream Beverages: water, coffee + fake sugar  Estimated Energy Needs Calories: 1500   NUTRITION DIAGNOSIS  Overweight/obesity ([place]-3.3) related to past poor dietary habits and physical inactivity as evidenced by patient w/ planned sleeve surgery following dietary guidelines for continued weight loss.   NUTRITION INTERVENTION  Nutrition counseling (C-1) and education (E-2) to facilitate bariatric surgery goals. Educated pts husband on putting together balanced meals  Pre-Op Goals Progress & New Goals Track everything you eat and drink Aim for a minimum  64 oz fluid per day continue: measure out cereal with measuring cup 1 cup and half banana (did not do this) continue: Aim for a minimum 1 cup of non starchy vegetables  2 times a day 7 days a week continue: do not add bacon grease to your foods continue: add protein to your fruit and crackers such as dried edamame  continue: cut out orange juice  continue: limit your fruit to 3 servings per day continue: do not add any sugar to coffee continue: keep prepped veggies in the fridge continue: aim to walk more with your roller walker in the house/leave the bedroom  Handouts Previously Provided Include  Detailed MyPlate  Learning Style & Readiness for Change Teaching method utilized: Visual & Auditory  Demonstrated degree of understanding via: Teach Back  Readiness Level: pre contemplative  Barriers to learning/adherence to lifestyle change: none identified   RD's Notes for next Visit  Assess pts adherence to chosen goals    MONITORING & EVALUATION Dietary intake, weekly physical activity, body weight, and pre-op goals   Next Steps  Patient is to return to [facility] for pre-op class  Pt has completed visits. No further supervised visits required/recomended

## 2021-02-10 ENCOUNTER — Ambulatory Visit (INDEPENDENT_AMBULATORY_CARE_PROVIDER_SITE_OTHER): Payer: Medicare Other | Admitting: Psychology

## 2021-02-10 DIAGNOSIS — F509 Eating disorder, unspecified: Secondary | ICD-10-CM

## 2021-02-11 ENCOUNTER — Ambulatory Visit: Payer: Medicare Other

## 2021-02-11 ENCOUNTER — Ambulatory Visit (INDEPENDENT_AMBULATORY_CARE_PROVIDER_SITE_OTHER): Payer: Medicare Other

## 2021-02-11 ENCOUNTER — Other Ambulatory Visit: Payer: Self-pay

## 2021-02-11 DIAGNOSIS — E538 Deficiency of other specified B group vitamins: Secondary | ICD-10-CM | POA: Diagnosis not present

## 2021-02-11 MED ORDER — CYANOCOBALAMIN 1000 MCG/ML IJ SOLN
1000.0000 ug | Freq: Once | INTRAMUSCULAR | Status: AC
  Administered 2021-02-11: 1000 ug via INTRAMUSCULAR

## 2021-02-11 NOTE — Progress Notes (Signed)
Per orders of Dr. Jordan, injection of B12 given by Cali Cuartas. Patient tolerated injection well.  

## 2021-02-17 ENCOUNTER — Ambulatory Visit (INDEPENDENT_AMBULATORY_CARE_PROVIDER_SITE_OTHER): Payer: Medicare Other

## 2021-02-17 ENCOUNTER — Other Ambulatory Visit: Payer: Self-pay

## 2021-02-17 DIAGNOSIS — Z1231 Encounter for screening mammogram for malignant neoplasm of breast: Secondary | ICD-10-CM

## 2021-02-17 DIAGNOSIS — Z0000 Encounter for general adult medical examination without abnormal findings: Secondary | ICD-10-CM | POA: Diagnosis not present

## 2021-02-17 NOTE — Patient Instructions (Signed)
[name] , Thank you for taking time to come for your Medicare Wellness Visit. I appreciate your ongoing commitment to your health goals. Please review the following plan we discussed and let me know if I can assist you in the future.   Screening recommendations/referrals: Colonoscopy: Done 12/18/17 repeat every 10 years 12/19/27 Mammogram: Done 01/25/19 repeat every year  Bone Density: Done 01/17/19 repeat every 2 years  Recommended yearly ophthalmology/optometry visit for glaucoma screening and checkup Recommended yearly dental visit for hygiene and checkup  Vaccinations: Influenza vaccine: Due Pneumococcal vaccine: Aged out/ Completed  Tdap vaccine: Due and discussed Shingles vaccine: Shingrix discussed. Please contact your pharmacy for coverage information.   Covid-19: Completed 10/28 & 06/07/20  Advanced directives: Advance directive discussed with you today. I have provided a copy for you to complete at home and have notarized. Once this is complete please bring a copy in to our office so we can scan it into your chart.  Conditions/risks identified: getting weight down  Next appointment: Follow up in one year for your annual wellness visit.   Preventive Care 40-64 Years, Female Preventive care refers to lifestyle choices and visits with your health care provider that can promote health and wellness. What does preventive care include? A yea.tharly physical exam. This is also called an annual well check. Dental exams once or twice a year. Routine eye exams. Ask your health care provider how often you should have your eyes checked. Personal lifestyle choices, including: Daily care of your teeth and gums. Regular physical activity. Eating a healthy diet. Avoiding tobacco and drug use. Limiting alcohol use. Practicing safe sex. Taking low-dose aspirin daily starting at age 56. Taking vitamin and mineral supplements as recommended by your health care provider. What happens during an  annual well check? The services and screenings done by your health care provider during your annual well check will depend on your age, overall health, lifestyle risk factors, and family history of disease. Counseling  Your health care provider may ask you questions about your: Alcohol use. Tobacco use. Drug use. Emotional well-being. Home and relationship well-being. Sexual activity. Eating habits. Work and work Statistician. Method of birth control. Menstrual cycle. Pregnancy history. Screening  You may have the following tests or measurements: Height, weight, and BMI. Blood pressure. Lipid and cholesterol levels. These may be checked every 5 years, or more frequently if you are over 56 years old. Skin check. Lung cancer screening. You may have this screening every year starting at age 72 if you have a 30-pack-year history of smoking and currently smoke or have quit within the past 15 years. Fecal occult blood test (FOBT) of the stool. You may have this test every year starting at age 3. Flexible sigmoidoscopy or colonoscopy. You may have a sigmoidoscopy every 5 years or a colonoscopy every 10 years starting at age 3. Hepatitis C blood test. Hepatitis B blood test. Sexually transmitted disease (STD) testing. Diabetes screening. This is done by checking your blood sugar (glucose) after you have not eaten for a while (fasting). You may have this done every 1-3 years. Mammogram. This may be done every 1-2 years. Talk to your health care provider about when you should start having regular mammograms. This may depend on whether you have a family history of breast cancer. BRCA-related cancer screening. This may be done if you have a family history of breast, ovarian, tubal, or peritoneal cancers. Pelvic exam and Pap test. This may be done every 3 years starting at  age 65. Starting at age 2, this may be done every 5 years if you have a Pap test in combination with an HPV test. Bone  density scan. This is done to screen for osteoporosis. You may have this scan if you are at high risk for osteoporosis. Discuss your test results, treatment options, and if necessary, the need for more tests with your health care provider. Vaccines  Your health care provider may recommend certain vaccines, such as: Influenza vaccine. This is recommended every year. Tetanus, diphtheria, and acellular pertussis (Tdap, Td) vaccine. You may need a Td booster every 10 years. Zoster vaccine. You may need this after age 67. Pneumococcal 13-valent conjugate (PCV13) vaccine. You may need this if you have certain conditions and were not previously vaccinated. Pneumococcal polysaccharide (PPSV23) vaccine. You may need one or two doses if you smoke cigarettes or if you have certain conditions. Talk to your health care provider about which screenings and vaccines you need and how often you need them. This information is not intended to replace advice given to you by your health care provider. Make sure you discuss any questions you have with your health care provider. Document Released: 07/24/2015 Document Revised: 03/16/2016 Document Reviewed: 04/28/2015 Elsevier Interactive Patient Education  2017 [place] Prevention in the Home Falls can cause injuries. They can happen to people of all ages. There are many things you can do to make your home safe and to help prevent falls. What can I do on the outside of my home? Regularly fix the edges of walkways and driveways and fix any cracks. Remove anything that might make you trip as you walk through a door, such as a raised step or threshold. Trim any bushes or trees on the path to your home. Use bright outdoor lighting. Clear any walking paths of anything that might make someone trip, such as rocks or tools. Regularly check to see if handrails are loose or broken. Make sure that both sides of any steps have handrails. Any raised decks and  porches should have guardrails on the edges. Have any leaves, snow, or ice cleared regularly. Use sand or salt on walking paths during winter. Clean up any spills in your garage right away. This includes oil or grease spills. What can I do in the bathroom? Use night lights. Install grab bars by the toilet and in the tub and shower. Do not use towel bars as grab bars. Use non-skid mats or decals in the tub or shower. If you need to sit down in the shower, use a plastic, non-slip stool. Keep the floor dry. Clean up any water that spills on the floor as soon as it happens. Remove soap buildup in the tub or shower regularly. Attach bath mats securely with double-sided non-slip rug tape. Do not have throw rugs and other things on the floor that can make you trip. What can I do in the bedroom? Use night lights. Make sure that you have a light by your bed that is easy to reach. Do not use any sheets or blankets that are too big for your bed. They should not hang down onto the floor. Have a firm chair that has side arms. You can use this for support while you get dressed. Do not have throw rugs and other things on the floor that can make you trip. What can I do in the kitchen? Clean up any spills right away. Avoid walking on wet floors. Keep items that  you use a lot in easy-to-reach places. If you need to reach something above you, use a strong step stool that has a grab bar. Keep electrical cords out of the way. Do not use floor polish or wax that makes floors slippery. If you must use wax, use non-skid floor wax. Do not have throw rugs and other things on the floor that can make you trip. What can I do with my stairs? Do not leave any items on the stairs. Make sure that there are handrails on both sides of the stairs and use them. Fix handrails that are broken or loose. Make sure that handrails are as long as the stairways. Check any carpeting to make sure that it is firmly attached to the  stairs. Fix any carpet that is loose or worn. Avoid having throw rugs at the top or bottom of the stairs. If you do have throw rugs, attach them to the floor with carpet tape. Make sure that you have a light switch at the top of the stairs and the bottom of the stairs. If you do not have them, ask someone to add them for you. What else can I do to help prevent falls? Wear shoes that: Do not have high heels. Have rubber bottoms. Are comfortable and fit you well. Are closed at the toe. Do not wear sandals. If you use a stepladder: Make sure that it is fully opened. Do not climb a closed stepladder. Make sure that both sides of the stepladder are locked into place. Ask someone to hold it for you, if possible. Clearly mark and make sure that you can see: Any grab bars or handrails. First and last steps. Where the edge of each step is. Use tools that help you move around (mobility aids) if they are needed. These include: Canes. Walkers. Scooters. Crutches. Turn on the lights when you go into a dark area. Replace any light bulbs as soon as they burn out. Set up your furniture so you have a clear path. Avoid moving your furniture around. If any of your floors are uneven, fix them. If there are any pets around you, be aware of where they are. Review your medicines with your doctor. Some medicines can make you feel dizzy. This can increase your chance of falling. Ask your doctor what other things that you can do to help prevent falls. This information is not intended to replace advice given to you by your health care provider. Make sure you discuss any questions you have with your health care provider. Document Released: 04/23/2009 Document Revised: 12/03/2015 Document Reviewed: 08/01/2014 Elsevier Interactive Patient Education  2017 [employer].

## 2021-02-17 NOTE — Progress Notes (Addendum)
Virtual Visit via Telephone Note  I connected with  [name] on 02/17/21 at  1:00 PM EDT by telephone and verified that I am speaking with the correct person using two identifiers.  Medicare Annual Wellness visit completed telephonically due to Covid-19 pandemic.   Persons participating in this call: This Health Coach and this patient.   Location: Patient: Home Provider: Office   I discussed the limitations, risks, security and privacy concerns of performing an evaluation and management service by telephone and the availability of in person appointments. The patient expressed understanding and agreed to proceed.  Unable to perform video visit due to video visit attempted and failed and/or patient does not have video capability.   Some vital signs may be absent or patient reported.   Marzella Schlein, LPN   Subjective:   [name] is a 65 y.o. female who presents for Medicare Annual (Subsequent) preventive examination.  Review of Systems     Cardiac Risk Factors include: advanced age (>76men, >57 women);obesity (BMI >30kg/m2);hypertension;dyslipidemia     Objective:    There were no vitals filed for this visit. There is no height or weight on file to calculate BMI.  Advanced Directives 02/17/2021 02/18/2020 02/12/2020 07/23/2019 10/05/2018 12/18/2017 10/25/2017  Does Patient Have a Medical Advance Directive? No No No No (No Data) No No  Would patient like information on creating a medical advance directive? Yes (MAU/Ambulatory/Procedural Areas - Information given) - Yes (MAU/Ambulatory/Procedural Areas - Information given) - - No - Patient declined No - Patient declined    Current Medications (verified) Outpatient Encounter Medications as of 02/17/2021  Medication Sig   albuterol (VENTOLIN HFA) 108 (90 Base) MCG/ACT inhaler Inhale 1-2 puffs into the lungs every 6 (six) hours as needed for wheezing or shortness of breath.   budesonide-formoterol (SYMBICORT)  160-4.5 MCG/ACT inhaler Inhale 2 puffs into the lungs 2 (two) times daily.   diclofenac (VOLTAREN) 75 MG EC tablet TAKE 1 TABLET BY MOUTH TWICE DAILY AS NEEDED   gabapentin (NEURONTIN) 300 MG capsule 1 cap am and noon, 2 caps at bedtime.   hydrochlorothiazide (HYDRODIURIL) 25 MG tablet Take 1 tablet (25 mg total) by mouth daily.   lidocaine (LIDODERM) 5 % Place 1 patch onto the skin daily as needed. Apply patch to area most significant pain once per day.  Remove and discard patch within 12 hours of application. (Patient taking differently: Place 1 patch onto the skin daily as needed. Apply patch to area most significant pain once per day.  Remove and discard patch within 12 hours of application.)   lisinopril (ZESTRIL) 40 MG tablet Take 1 tablet (40 mg total) by mouth daily.   methocarbamol (ROBAXIN) 500 MG tablet Take 1 tablet (500 mg total) by mouth every 8 (eight) hours as needed for muscle spasms.   phentermine (ADIPEX-P) 37.5 MG tablet phentermine 37.5 mg tablet  TAKE 1/2 (ONE-HALF) TABLET BY MOUTH ONCE DAILY FOR 7 DAYS THEN EVERY OTHER DAY THEN EVERY 3RD DAY AND DISCONTINUE   rosuvastatin (CRESTOR) 10 MG tablet Take 1 tablet (10 mg total) by mouth daily.   Semaglutide,0.25 or 0.5MG /DOS, (OZEMPIC, 0.25 OR 0.5 MG/DOSE,) 2 MG/1.5ML SOPN INJECT 0.25 MG UNDER THE SKIN ONCE A WEEK FOR 2 WEEKS, THEN INCREASE TO 0.5 MG ONCE A WEEK   No facility-administered encounter medications on file as of 02/17/2021.    Allergies (verified) Ketorolac tromethamine, Atrovent [ipratropium], Latex, and Tape   History: Past Medical History:  Diagnosis Date   Abnormal EKG  06/2003   History of inverted T waves V1-V3. Normal 2D echo (07/23/2003): LVEF 65%.   Anemia    BL Hgb 11-12. Ferritin 14 - low normal (08/2007). Colonoscopy 2009 - external hemorrhoids (excellent prep). Last anemia panel (12/2010) - Iron  24, TIBC 269,  B12  316, Folate 11.9, Ferritin 73.   Asthma    B12 deficiency    Back pain    Chest pain     Degenerative joint disease    BL knees (L>R), lumbar spine. Followed by Sports Medicine, Dr. Jennette Kettle.   Dyspnea    History of multiple pulmonary nodules    Incidental finding: CT Abd/ Pelvis (04/2010) - Several small lower lobe lung nodules, including one pure ground-glass pulmonary nodule measuring 8 mm in the left lower lobe.  Recommend follow-up chest CT (IV contrast preferred) in 6 months to document stability. //  CT Abd/ Pelvis (07/2010) -  3 mm RLL and  8 mm LLL nodule stable.  Other nodules unchanged, likely benign.   Hyperlipidemia    Hypertension    Incarcerated ventral hernia 04/2010   Noted on CT Abd/ pelvis (04/2010). Patient now s/p ventral hernia repair by Dr. 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 2D echo (2005) - EF 65%.   Obesity    OSA (obstructive sleep apnea)    Palpitations    Positive D dimer    Right knee pain    Past Surgical History:  Procedure Laterality Date   COLONOSCOPY  2009   COLONOSCOPY WITH PROPOFOL N/A 12/18/2017   Procedure: COLONOSCOPY WITH PROPOFOL;  Surgeon: Iva Boop, MD;  Location: [facility];  Service: Endoscopy;  Laterality: N/A;   FOOT SURGERY  2004    left   INCISION AND DRAINAGE ABSCESS ANAL  07/2008   I&D and debridgement of anorectal abscess   INCISIONAL HERNIA REPAIR  12/2010   Repair of incarcerated ventral incisional hernia with Ethicon mesh patch - performed by Dr. Gerrit Friends.    INGUINAL HERNIA REPAIR     JOINT REPLACEMENT Left 2013   left knee   TOTAL KNEE ARTHROPLASTY  06/16/2011   Left TOTAL KNEE ARTHROPLASTY;  Surgeon: Kennieth Rad;  Location: [facility];  Service: Orthopedics;  Laterality: Left;  left total knee arthroplasty   TUBAL LIGATION     Family History  Problem Relation Age of Onset   Diabetes Mother    Hypertension Mother    Stroke Mother     Hyperlipidemia Mother    Heart disease Mother    Sudden death Mother    Kidney disease Mother    Depression Mother    Dementia Father    Heart disease Father    Hyperlipidemia Father    Sudden death Father    Depression Father    Hypertension Sister    Diabetes Brother    Hypertension Brother    Rashes / Skin problems Maternal Grandfather    Heart attack Neg Hx    Social History   Socioeconomic History   Marital status: Married    Spouse name: Gerarda Fraction   Number of children: 3   Years of education: Not on file   Highest education level: Not on file  Occupational History   Occupation: Stay at home  Tobacco Use  Smoking status: Former    Packs/day: 0.50    Years: 20.00    Pack years: 10.00    Types: Cigarettes    Quit date: 09/10/1998    Years since quitting: 22.4   Smokeless tobacco: Never   Tobacco comments:    65 yo   Vaping Use   Vaping Use: Never used  Substance and Sexual Activity   Alcohol use: No    Alcohol/week: 0.0 standard drinks   Drug use: No   Sexual activity: Yes    Partners: Male  Other Topics Concern   Not on file  Social History Narrative   10/05/2018: Lives with husband, daughter, and 2 grandchildren in [place]. Lives on main level though mostly.   Has three children altogether, all local, supportive   Currently getting ramp installed in house d/t pt difficulty getting upstairs.      Social Determinants of Health   Financial Resource Strain: Low Risk    Difficulty of Paying Living Expenses: Not hard at all  Food Insecurity: No Food Insecurity   Worried About Programme researcher, broadcasting/film/videounning Out of Food in the Last Year: Never true   Ran Out of Food in the Last Year: Never true  Transportation Needs: No Transportation Needs   Lack of Transportation (Medical): No   Lack of Transportation (Non-Medical): No  Physical Activity: Insufficiently Active   Days of Exercise per Week: 2 days   Minutes of Exercise per Session: 10 min  Stress: No Stress Concern Present    Feeling of Stress : Not at all  Social Connections: Moderately Integrated   Frequency of Communication with Friends and Family: More than three times a week   Frequency of Social Gatherings with Friends and Family: Twice a week   Attends Religious Services: 1 to 4 times per year   Active Member of [employer]ubs or Organizations: No   Attends Engineer, structuralClub or Organization Meetings: Never   Marital Status: Married    Tobacco Counseling Counseling given: Not Answered Tobacco comments: 65 yo    Clinical Intake:  Pre-visit preparation completed: Yes  Pain : No/denies pain     BMI - recorded: 57.99 Nutritional Status: BMI > 30  Obese Nutritional Risks: None Diabetes: No  How often do you need to have someone help you when you read instructions, pamphlets, or other written materials from your doctor or pharmacy?: 1 - Never  Diabetic?No  Interpreter Needed?: No  Information entered by :: Lanier Ensignina Scottlynn Lindell, LPN   Activities of Daily Living In your present state of health, do you have any difficulty performing the following activities: 02/17/2021  Hearing? N  Vision? N  Difficulty concentrating or making decisions? N  Walking or climbing stairs? Y  Comment unable to climb  Dressing or bathing? N  Doing errands, shopping? N  Preparing Food and eating ? N  Using the Toilet? N  In the past six months, have you accidently leaked urine? N  Do you have problems with loss of bowel control? N  Managing your Medications? N  Managing your Finances? N  Housekeeping or managing your Housekeeping? N  Some recent data might be hidden    Patient Care Team: [place], [name], MD as PCP - General (Family Medicine) Quintella Reicherturner, Traci R, MD as PCP - Cardiology (Cardiology) Verner CholPryor, Madeline G, [facility] as Pharmacist (Pharmacist)  Indicate any recent Medical Services you may have received from other than [facility] providers in the past year (date may be approximate).     Assessment:   This  is a routine wellness  examination for [name].  Hearing/Vision screen Hearing Screening - Comments:: Pt denies any hearing issues  Vision Screening - Comments:: Pt follows up with dr Reece Agar for annual eye exams   Dietary issues and exercise activities discussed: Current Exercise Habits: Home exercise routine, Type of exercise: Other - see comments (bedside exxercise), Time (Minutes): 15, Frequency (Times/Week): 2, Weekly Exercise (Minutes/Week): 30   Goals Addressed             This Visit's Progress    lose weight         Depression Screen PHQ 2/9 Scores 02/17/2021 02/12/2020 10/05/2018 05/09/2018 09/29/2017 09/27/2017 08/18/2016  PHQ - 2 Score 0 0 0  PHQ- 9 Score - - -    Fall Risk Fall Risk  02/17/2021 02/12/2020 10/05/2018 08/11/2018 09/27/2017  Falls in the past year? 0 0 0 1 Yes  Number falls in past yr: 0 0 - 1 2 or more  Injury with Fall? 0 0 - 0 -  Risk Factor Category  - - - - -  Risk for fall due to : Impaired vision;Impaired mobility;Impaired balance/gait Impaired balance/gait;Impaired mobility;Impaired vision History of fall(s);Impaired balance/gait;Impaired mobility;Impaired vision;Medication side effect History of fall(s);Impaired balance/gait;Impaired mobility;Orthopedic patient Impaired mobility;History of fall(s)  Risk for fall due to: Comment - - - - 363lbs with knee issues and balance issues   Follow up Falls prevention discussed Falls prevention discussed Education provided;Falls prevention discussed Education provided (No Data)  Comment - - - - loses her balance     FALL RISK PREVENTION PERTAINING TO THE HOME:  Any stairs in or around the home? Yes  If so, are there any without handrails? No  Home free of loose throw rugs in walkways, pet beds, electrical cords, etc? Yes  Adequate lighting in your home to reduce risk of falls? Yes   ASSISTIVE DEVICES UTILIZED TO PREVENT FALLS:  Life alert? No  Use of a cane, walker or w/c? Yes  Grab bars in the bathroom? No  Shower chair  or bench in shower? No  Elevated toilet seat or a handicapped toilet? No   TIMED UP AND GO:  Was the test performed? No .   Cognitive Function:     6CIT Screen 02/17/2021 02/12/2020  What Year? 0 points 0 points  What month? 0 points 0 points  What time? 0 points -  Count back from 20 0 points 0 points  Months in reverse 0 points 0 points  Repeat phrase 2 points 6 points  Total Score 2 -    Immunizations Immunization History  Administered Date(s) Administered   Influenza Split 03/29/2012   Influenza,inj,Quad PF,6+ Mos 04/09/2013, 09/23/2014, 05/04/2015, 04/04/2016, 03/20/2017, 05/02/2018, 04/27/2020   Moderna Sars-Covid-2 Vaccination 05/07/2020, 06/07/2020   Pneumococcal Polysaccharide-23 03/29/2012, 04/04/2016   Tdap 10/14/2010    TDAP status: Due, Education has been provided regarding the importance of this vaccine. Advised may receive this vaccine at local pharmacy or Health Dept. Aware to provide a copy of the vaccination record if obtained from local pharmacy or Health Dept. Verbalized acceptance and understanding.  Flu Vaccine status: Due, Education has been provided regarding the importance of this vaccine. Advised may receive this vaccine at local pharmacy or Health Dept. Aware to provide a copy of the vaccination record if obtained from local pharmacy or Health Dept. Verbalized acceptance and understanding.    Covid-19 vaccine status: Completed vaccines  Qualifies for Shingles Vaccine? Yes  Zostavax completed No   Shingrix Completed?: No.    Education has been provided regarding the importance of this vaccine. Patient has been advised to call insurance company to determine out of pocket expense if they have not yet received this vaccine. Advised may also receive vaccine at local pharmacy or Health Dept. Verbalized acceptance and understanding.  Screening Tests Health Maintenance  Topic Date Due   Zoster Vaccines- Shingrix (1 of 2) Never done   TETANUS/TDAP   10/13/2020   COVID-19 Vaccine (3 - Booster for Moderna series) 11/05/2020   MAMMOGRAM  01/24/2021   INFLUENZA VACCINE  02/08/2021   PAP SMEAR-Modifier  01/20/2022   COLONOSCOPY (Pts 45-1yrs Insurance coverage will need to be confirmed)  12/19/2027   Hepatitis C Screening  Completed   HIV Screening  Completed   Pneumococcal Vaccine 79-88 Years old  Aged Out   HPV VACCINES  Aged Out    Health Maintenance  Health Maintenance Due  Topic Date Due   Zoster Vaccines- Shingrix (1 of 2) Never done   TETANUS/TDAP  10/13/2020   COVID-19 Vaccine (3 - Booster for Moderna series) 11/05/2020   MAMMOGRAM  01/24/2021   INFLUENZA VACCINE  02/08/2021    Colorectal cancer screening: Type of screening: Colonoscopy. Completed 10. Repeat every 10 years  Mammogram status: Ordered 02/17/21. Pt provided with contact info and advised to call to schedule appt.   Bone scan 01/17/19   Additional Screening:  Hepatitis C Screening:  Completed 08/18/16  Vision Screening: Recommended annual ophthalmology exams for early detection of glaucoma and other disorders of the eye. Is the patient up to date with their annual eye exam?  Yes  Who is the provider or what is the name of the office in which the patient attends annual eye exams? Dr g  If pt is not established with a provider, would they like to be referred to a provider to establish care? No .   Dental Screening: Recommended annual dental exams for proper oral hygiene  Community Resource Referral / Chronic Care Management: CRR required this visit?  No   CCM required this visit?  No      Plan:     I have personally reviewed and noted the following in the patient's chart:   Medical and social history Use of alcohol, tobacco or illicit drugs  Current medications and supplements including opioid prescriptions.  Functional ability and status Nutritional status Physical activity Advanced directives List of other physicians Hospitalizations,  surgeries, and ER visits in previous 12 months Vitals Screenings to include cognitive, depression, and falls Referrals and appointments  In addition, I have reviewed and discussed with patient certain preventive protocols, quality metrics, and best practice recommendations. A written personalized care plan for preventive services as well as general preventive health recommendations were provided to patient.     Marzella Schlein, LPN   5/40/0867   Nurse Notes: None

## 2021-02-18 ENCOUNTER — Other Ambulatory Visit: Payer: Self-pay | Admitting: Family Medicine

## 2021-02-18 DIAGNOSIS — R7303 Prediabetes: Secondary | ICD-10-CM

## 2021-02-19 ENCOUNTER — Other Ambulatory Visit: Payer: Self-pay | Admitting: Family Medicine

## 2021-02-19 DIAGNOSIS — M5116 Intervertebral disc disorders with radiculopathy, lumbar region: Secondary | ICD-10-CM

## 2021-02-19 DIAGNOSIS — R7303 Prediabetes: Secondary | ICD-10-CM

## 2021-02-19 DIAGNOSIS — M159 Polyosteoarthritis, unspecified: Secondary | ICD-10-CM

## 2021-02-19 MED ORDER — DICLOFENAC SODIUM 75 MG PO TBEC: 75.0000 mg | DELAYED_RELEASE_TABLET | Freq: Two times a day (BID) | ORAL | 0 refills | Status: DC | PRN

## 2021-02-19 MED ORDER — OZEMPIC (0.25 OR 0.5 MG/DOSE) 2 MG/1.5ML ~~LOC~~ SOPN: PEN_INJECTOR | SUBCUTANEOUS | 0 refills | Status: DC

## 2021-02-19 NOTE — Progress Notes (Signed)
Last follow up 07/2020.

## 2021-02-24 ENCOUNTER — Ambulatory Visit: Payer: Self-pay | Admitting: Surgery

## 2021-02-24 ENCOUNTER — Ambulatory Visit: Payer: Medicare Other | Admitting: Psychology

## 2021-03-30 ENCOUNTER — Other Ambulatory Visit: Payer: Self-pay | Admitting: Family Medicine

## 2021-03-30 DIAGNOSIS — I10 Essential (primary) hypertension: Secondary | ICD-10-CM

## 2021-04-14 ENCOUNTER — Encounter (HOSPITAL_COMMUNITY): Payer: Self-pay | Admitting: Surgery

## 2021-04-14 DIAGNOSIS — H47391 Other disorders of optic disc, right eye: Secondary | ICD-10-CM | POA: Diagnosis not present

## 2021-04-14 DIAGNOSIS — H35013 Changes in retinal vascular appearance, bilateral: Secondary | ICD-10-CM | POA: Diagnosis not present

## 2021-04-14 DIAGNOSIS — H31091 Other chorioretinal scars, right eye: Secondary | ICD-10-CM | POA: Diagnosis not present

## 2021-04-14 DIAGNOSIS — H31092 Other chorioretinal scars, left eye: Secondary | ICD-10-CM | POA: Diagnosis not present

## 2021-04-14 DIAGNOSIS — H25813 Combined forms of age-related cataract, bilateral: Secondary | ICD-10-CM | POA: Diagnosis not present

## 2021-04-21 DIAGNOSIS — Z01818 Encounter for other preprocedural examination: Secondary | ICD-10-CM | POA: Diagnosis not present

## 2021-04-23 ENCOUNTER — Ambulatory Visit (HOSPITAL_COMMUNITY)
Admission: RE | Admit: 2021-04-23 | Discharge: 2021-04-23 | Disposition: A | Discharge status: Home / Self Care | Payer: Medicare Other | Source: Ambulatory Visit | Attending: Surgery | Admitting: Surgery

## 2021-04-23 ENCOUNTER — Ambulatory Visit (HOSPITAL_COMMUNITY): Payer: Medicare Other | Admitting: Anesthesiology

## 2021-04-23 ENCOUNTER — Encounter (HOSPITAL_COMMUNITY)
Admission: RE | Disposition: A | Discharge status: Home / Self Care | Payer: Self-pay | Source: Ambulatory Visit | Attending: Surgery

## 2021-04-23 ENCOUNTER — Other Ambulatory Visit: Payer: Self-pay

## 2021-04-23 ENCOUNTER — Encounter (HOSPITAL_COMMUNITY): Payer: Self-pay | Admitting: Surgery

## 2021-04-23 DIAGNOSIS — E785 Hyperlipidemia, unspecified: Secondary | ICD-10-CM | POA: Diagnosis not present

## 2021-04-23 DIAGNOSIS — E559 Vitamin D deficiency, unspecified: Secondary | ICD-10-CM | POA: Diagnosis not present

## 2021-04-23 DIAGNOSIS — Z01818 Encounter for other preprocedural examination: Secondary | ICD-10-CM | POA: Insufficient documentation

## 2021-04-23 DIAGNOSIS — Z87891 Personal history of nicotine dependence: Secondary | ICD-10-CM | POA: Insufficient documentation

## 2021-04-23 DIAGNOSIS — K219 Gastro-esophageal reflux disease without esophagitis: Secondary | ICD-10-CM | POA: Insufficient documentation

## 2021-04-23 DIAGNOSIS — Z6841 Body Mass Index (BMI) 40.0 and over, adult: Secondary | ICD-10-CM | POA: Insufficient documentation

## 2021-04-23 DIAGNOSIS — K2950 Unspecified chronic gastritis without bleeding: Secondary | ICD-10-CM | POA: Diagnosis not present

## 2021-04-23 DIAGNOSIS — K2289 Other specified disease of esophagus: Secondary | ICD-10-CM | POA: Insufficient documentation

## 2021-04-23 DIAGNOSIS — G4733 Obstructive sleep apnea (adult) (pediatric): Secondary | ICD-10-CM | POA: Diagnosis not present

## 2021-04-23 HISTORY — PX: ESOPHAGOGASTRODUODENOSCOPY: SHX5428

## 2021-04-23 HISTORY — PX: BIOPSY: SHX5522

## 2021-04-23 LAB — GLUCOSE, CAPILLARY: Glucose-Capillary: 85 mg/dL (ref 70–99)

## 2021-04-23 SURGERY — EGD (ESOPHAGOGASTRODUODENOSCOPY)
Anesthesia: Monitor Anesthesia Care

## 2021-04-23 MED ORDER — PROPOFOL 10 MG/ML IV BOLUS
INTRAVENOUS | Status: DC | PRN
  Administered 2021-04-23: 20 mg via INTRAVENOUS
  Administered 2021-04-23: 30 mg via INTRAVENOUS

## 2021-04-23 MED ORDER — PROPOFOL 500 MG/50ML IV EMUL
INTRAVENOUS | Status: AC
  Filled 2021-04-23: qty 50

## 2021-04-23 MED ORDER — LACTATED RINGERS IV SOLN
INTRAVENOUS | Status: DC
  Administered 2021-04-23: 1000 mL via INTRAVENOUS

## 2021-04-23 MED ORDER — PROPOFOL 500 MG/50ML IV EMUL
INTRAVENOUS | Status: DC | PRN
  Administered 2021-04-23: 125 ug/kg/min via INTRAVENOUS

## 2021-04-23 MED ORDER — LIDOCAINE 2% (20 MG/ML) 5 ML SYRINGE
INTRAMUSCULAR | Status: DC | PRN
  Administered 2021-04-23: 100 mg via INTRAVENOUS

## 2021-04-23 NOTE — H&P (Signed)
Admitting Physician: Hyman Hopes Kareem Aul  Service: Bariatric surgery  CC: Morbid obesity  Subjective   HPI: BETZY BARBIER is an 65 y.o. female who is here for elective EGD prior to bariatric surgery  Past Medical History:  Diagnosis Date   Abnormal EKG 06/2003   History of inverted T waves V1-V3. Normal 2D echo (07/23/2003): LVEF 65%.   Anemia    BL Hgb 11-12. Ferritin 14 - low normal (08/2007). Colonoscopy 2009 - external hemorrhoids (excellent prep). Last anemia panel (12/2010) - Iron  24, TIBC 269,  B12  316, Folate 11.9, Ferritin 73.   Asthma    B12 deficiency    Back pain    Chest pain    Degenerative joint disease    BL knees (L>R), lumbar spine. Followed by Sports Medicine, Dr. Jennette Kettle.   Dyspnea    History of multiple pulmonary nodules    Incidental finding: CT Abd/ Pelvis (04/2010) - Several small lower lobe lung nodules, including one pure ground-glass pulmonary nodule measuring 8 mm in the left lower lobe.  Recommend follow-up chest CT (IV contrast preferred) in 6 months to document stability. //  CT Abd/ Pelvis (07/2010) -  3 mm RLL and  8 mm LLL nodule stable.  Other nodules unchanged, likely benign.   Hyperlipidemia    Hypertension    Incarcerated ventral hernia 04/2010   Noted on CT Abd/ pelvis (04/2010). Patient now s/p ventral hernia repair by Dr. 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 2D echo (2005) - EF 65%.   Obesity    OSA (obstructive sleep apnea)    Palpitations    Positive D dimer    Right knee pain     Past Surgical History:  Procedure Laterality Date   COLONOSCOPY  2009   COLONOSCOPY WITH PROPOFOL N/A 12/18/2017   Procedure: COLONOSCOPY WITH PROPOFOL;  Surgeon: Iva Boop, MD;  Location: [facility];  Service: Endoscopy;  Laterality: N/A;   FOOT SURGERY  2004    left    INCISION AND DRAINAGE ABSCESS ANAL  07/2008   I&D and debridgement of anorectal abscess   INCISIONAL HERNIA REPAIR  12/2010   Repair of incarcerated ventral incisional hernia with Ethicon mesh patch - performed by Dr. Gerrit Friends.    INGUINAL HERNIA REPAIR     JOINT REPLACEMENT Left 2013   left knee   TOTAL KNEE ARTHROPLASTY  06/16/2011   Left TOTAL KNEE ARTHROPLASTY;  Surgeon: Kennieth Rad;  Location: [facility];  Service: Orthopedics;  Laterality: Left;  left total knee arthroplasty   TUBAL LIGATION      Family History  Problem Relation Age of Onset   Diabetes Mother    Hypertension Mother    Stroke Mother    Hyperlipidemia Mother    Heart disease Mother    Sudden death Mother    Kidney disease Mother    Depression Mother    Dementia Father    Heart disease Father    Hyperlipidemia Father    Sudden death Father    Depression Father    Hypertension Sister    Diabetes Brother    Hypertension Brother    Rashes / Skin problems Maternal Grandfather    Heart attack Neg Hx     Social:  reports  that she quit smoking about 22 years ago. Her smoking use included cigarettes. She has a 10.00 pack-year smoking history. She has never used smokeless tobacco. She reports that she does not drink alcohol and does not use drugs.  Allergies:  Allergies  Allergen Reactions   Ketorolac Tromethamine Shortness Of Breath and Palpitations   Atrovent [Ipratropium] Hives and Rash   Latex Rash and Other (See Comments)    NO POWDERED GLOVES!!!!   Tape Dermatitis    Irritates skin     Medications: Current Outpatient Medications  Medication Instructions   albuterol (VENTOLIN HFA) 108 (90 Base) MCG/ACT inhaler 1-2 puffs, Inhalation, Every 6 hours PRN   budesonide-formoterol (SYMBICORT) 160-4.5 MCG/ACT inhaler 2 puffs, Inhalation, 2 times daily   cholecalciferol (VITAMIN D3) 1,000 Units, Oral, Daily   diclofenac (VOLTAREN) 75 mg, Oral, 2 times daily PRN   hydrochlorothiazide (HYDRODIURIL) 25 mg, Oral,  Daily   lidocaine (LIDODERM) 5 % 1 patch, Transdermal, Daily PRN, Apply patch to area most significant pain once per day.  Remove and discard patch within 12 hours of application.   lisinopril (ZESTRIL) 40 MG tablet TAKE 1 TABLET BY MOUTH  DAILY   methocarbamol (ROBAXIN) 500 mg, Oral, Every 8 hours PRN   rosuvastatin (CRESTOR) 10 mg, Oral, Daily   Semaglutide,0.25 or 0.5MG /DOS, (OZEMPIC, 0.25 OR 0.5 MG/DOSE,) 2 MG/1.5ML SOPN INJECT 0.25 MG UNDER THE SKIN ONCE A WEEK FOR 2 WEEKS, THEN INCREASE TO 0.5MG  ONCE A WEEK THEREFATER   trolamine salicylate (ASPERCREME) 10 % cream 1 application, Topical, As needed    ROS - all of the below systems have been reviewed with the patient and positives are indicated with bold text General: chills, fever or night sweats Eyes: blurry vision or double vision ENT: epistaxis or sore throat Allergy/Immunology: itchy/watery eyes or nasal congestion Hematologic/Lymphatic: bleeding problems, blood clots or swollen lymph nodes Endocrine: temperature intolerance or unexpected weight changes Breast: new or changing breast lumps or nipple discharge Resp: cough, shortness of breath, or wheezing CV: chest pain or dyspnea on exertion GI: as per HPI GU: dysuria, trouble voiding, or hematuria MSK: joint pain or joint stiffness Neuro: TIA or stroke symptoms Derm: pruritus and skin lesion changes Psych: anxiety and depression  Objective   PE Blood pressure 107/62, pulse 80, temperature 98.5 F (36.9 C), temperature source Oral, resp. rate 17, height 5\' 5"  (1.651 m), weight (!) 157.9 kg, last menstrual period 09/09/2009, SpO2 100 %. Constitutional: NAD; conversant; no deformities Eyes: Moist conjunctiva; no lid lag; anicteric; PERRL Neck: Trachea midline; no thyromegaly Lungs: Normal respiratory effort; no tactile fremitus CV: RRR; no palpable thrills; no pitting edema GI: Abd Soft, nontender; no palpable hepatosplenomegaly MSK: Normal range of motion of extremities;  no clubbing/cyanosis Psychiatric: Appropriate affect; alert and oriented x3 Lymphatic: No palpable cervical or axillary lymphadenopathy  Results for orders placed or performed during the hospital encounter of 04/23/21 (from the past 24 hour(s))  Glucose, capillary     Status: None   Collection Time: 04/23/21 12:04 PM  Result Value Ref Range   Glucose-Capillary 85 70 - 99 mg/dL    Imaging Orders  No imaging studies ordered today     Assessment and Plan    Ms. Seckinger is a 65 year old female presenting for bariatric surgery consultation. The patient attended the information session and is interested in pursuing bariatric surgery. I entered the patient's information into the [facility] bariatric risk-benefit calculator. I used this as a outlined to facilitate our discussion of the risks, and benefits  of bariatric surgery, specifically comparing laparoscopic sleeve gastrectomy versus laparoscopic Roux-en-Y gastric bypass. The patient is interested in pursuing a sleeve gastrectomy. Her preoperative pathway included: - Bloodwork - Nutrition consult - completed with Alexis [name] - Chest x-ray 08/06/20 No active cardiopulmonary disease. - EKG 08/06/20 NSR and T wave abnormality - Cardiology consultation with Dr. Jacques Navy 11/02/20 Eugenie Birks completed, low risk - Psychology evaluation   Today she presents for upper endoscopy with biopsy.  I explained my reasoning for preoperative EGD in my bariatric surgery patients to evaluate forget anatomy, biopsy for H. pylori, checked for hiatal hernia, and look for signs of reflux. The risks, benefits, and alternatives of EGD were discussed the patient and she is has granted consent to proceed.  We will proceed as scheduled.   Quentin Ore, MD  [facility] Use [email or web address] to contact on call provider

## 2021-04-23 NOTE — Anesthesia Procedure Notes (Signed)
Procedure Name: MAC Date/Time: 04/23/2021 1:52 PM Performed by: Lollie Sails, CRNA Pre-anesthesia Checklist: Patient identified, Emergency Drugs available, Suction available, Patient being monitored and Timeout performed Oxygen Delivery Method: Simple face mask Preoxygenation: POM used. Placement Confirmation: positive ETCO2

## 2021-04-23 NOTE — Anesthesia Preprocedure Evaluation (Signed)
Anesthesia Evaluation  Patient identified by MRN, date of birth, ID band Patient awake    Reviewed: Allergy & Precautions, NPO status , Patient's Chart, lab work & pertinent test results  Airway Mallampati: II  TM Distance: >3 FB Neck ROM: Full    Dental no notable dental hx.    Pulmonary asthma , sleep apnea , former smoker,    Pulmonary exam normal breath sounds clear to auscultation       Cardiovascular hypertension, Normal cardiovascular exam Rhythm:Regular Rate:Normal     Neuro/Psych negative neurological ROS  negative psych ROS   GI/Hepatic negative GI ROS, Neg liver ROS,   Endo/Other  Morbid obesity  Renal/GU negative Renal ROS  negative genitourinary   Musculoskeletal negative musculoskeletal ROS (+)   Abdominal   Peds negative pediatric ROS (+)  Hematology negative hematology ROS (+)   Anesthesia Other Findings   Reproductive/Obstetrics negative OB ROS                             Anesthesia Physical Anesthesia Plan  ASA: 3  Anesthesia Plan: MAC   Post-op Pain Management:    Induction: Intravenous  PONV Risk Score and Plan: 2 and Treatment may vary due to age or medical condition and Propofol infusion  Airway Management Planned: Simple Face Mask  Additional Equipment:   Intra-op Plan:   Post-operative Plan: Extubation in OR  Informed Consent: I have reviewed the patients History and Physical, chart, labs and discussed the procedure including the risks, benefits and alternatives for the proposed anesthesia with the patient or authorized representative who has indicated his/her understanding and acceptance.     Dental advisory given  Plan Discussed with: CRNA and Surgeon  Anesthesia Plan Comments:         Anesthesia Quick Evaluation

## 2021-04-23 NOTE — Anesthesia Postprocedure Evaluation (Signed)
Anesthesia Post Note  Patient: [name]  Procedure(s) Performed: ESOPHAGOGASTRODUODENOSCOPY (EGD) BIOPSY     Patient location during evaluation: PACU Anesthesia Type: MAC Level of consciousness: awake and alert Pain management: pain level controlled Vital Signs Assessment: post-procedure vital signs reviewed and stable Respiratory status: spontaneous breathing, nonlabored ventilation, respiratory function stable and patient connected to nasal cannula oxygen Cardiovascular status: stable and blood pressure returned to baseline Postop Assessment: no apparent nausea or vomiting Anesthetic complications: no   No notable events documented.  Last Vitals:  Vitals:   04/23/21 1406 04/23/21 1410  BP: (!) 155/72 130/75  Pulse: 98 81  Resp: (!) 22 (!) 21  Temp:    SpO2: 100% 100%    Last Pain:  Vitals:   04/23/21 1406  TempSrc:   PainSc: 0-No pain                 Donnell Beauchamp S

## 2021-04-23 NOTE — Transfer of Care (Signed)
Immediate Anesthesia Transfer of Care Note  Patient: [name]  Procedure(s) Performed: ESOPHAGOGASTRODUODENOSCOPY (EGD) BIOPSY  Patient Location: PACU and Endoscopy Unit  Anesthesia Type:MAC  Level of Consciousness: awake, alert  and oriented  Airway & Oxygen Therapy: Patient Spontanous Breathing and Patient connected to face mask oxygen  Post-op Assessment: Report given to RN and Post -op Vital signs reviewed and stable  Post vital signs: Reviewed and stable  Last Vitals:  Vitals Value Taken Time  BP 155/72 04/23/21 1406  Temp    Pulse 89 04/23/21 1406  Resp 24 04/23/21 1406  SpO2 100 % 04/23/21 1406  Vitals shown include unvalidated device data.  Last Pain:  Vitals:   04/23/21 1406  TempSrc:   PainSc: 0-No pain         Complications: No notable events documented.

## 2021-04-23 NOTE — Discharge Instructions (Signed)
YOU HAD AN ENDOSCOPIC PROCEDURE TODAY: Refer to the procedure report and other information in the discharge instructions given to you for any specific questions about what was found during the exam.  YOU SHOULD EXPECT: Some feelings of bloating in the abdomen. Passage of more gas than usual. Walking can help get rid of the air that was put into your GI tract during the procedure and reduce the bloating. If you had a lower endoscopy (such as a colonoscopy or flexible sigmoidoscopy) you may notice spotting of blood in your stool or on the toilet paper. Some abdominal soreness may be present for a day or two, also.  DIET: Your first meal following the procedure should be a light meal and then it is ok to progress to your normal diet. A half-sandwich or bowl of soup is an example of a good first meal. Heavy or fried foods are harder to digest and may make you feel nauseous or bloated. Drink plenty of fluids but you should avoid alcoholic beverages for 24 hours. If you had an esophageal dilation, please see attached information for diet.   ACTIVITY: Your care partner should take you home directly after the procedure. You should plan to take it easy, moving slowly for the rest of the day. You can resume normal activity the day after the procedure however YOU SHOULD NOT DRIVE, use power tools, machinery or perform tasks that involve climbing or major physical exertion for 24 hours (because of the sedation medicines used during the test).   SYMPTOMS TO REPORT IMMEDIATELY: A gastroenterologist can be reached at any hour.    Following upper endoscopy (EGD, EUS, ERCP, esophageal dilation) Vomiting of blood or coffee ground material  New, significant abdominal pain  New, significant chest pain or pain under the shoulder blades  Painful or persistently difficult swallowing  New shortness of breath  Black, tarry-looking or red, bloody stools  FOLLOW UP:  If any biopsies were taken you will be contacted by  phone or by letter within the next 1-3 weeks.  Please also call with any specific questions about appointments or follow up tests.

## 2021-04-23 NOTE — Op Note (Addendum)
[facility] Patient Name: [name] Procedure Date: 04/23/2021 MRN: [id number] Attending MD: Felicie Morn ,  Date of Birth: 1955-10-14 CSN: [id number] Age: 65 Admit Type: Outpatient Procedure:                Upper GI endoscopy Indications:              Screening procedure, Obesity due to excess calories Providers:                Felicie Morn, Dulcy Fanny, Frazier Richards, Technician, [facility], Technician Referring MD:              Medicines:                Monitored Anesthesia Care Complications:            No immediate complications. Estimated Blood Loss:     Estimated blood loss was minimal. Procedure:                Pre-Anesthesia Assessment:                           - Prior to the procedure, a History and Physical                            was performed, and patient medications and                            allergies were reviewed. The patient is competent.                            The risks and benefits of the procedure and the                            sedation options and risks were discussed with the                            patient. All questions were answered and informed                            consent was obtained. Patient identification and                            proposed procedure were verified in the                            pre-procedure area. Mental Status Examination:                            alert and oriented. Airway Examination: normal                            oropharyngeal airway and neck mobility. Respiratory  Examination: clear to auscultation. CV Examination:                            normal. ASA Grade Assessment: II - A patient with                            mild systemic disease. After reviewing the risks                            and benefits, the patient was deemed in                            satisfactory condition to undergo the  procedure.                            The anesthesia plan was to use monitored anesthesia                            care (MAC). Immediately prior to administration of                            medications, the patient was re-assessed for                            adequacy to receive sedatives. The heart rate,                            respiratory rate, oxygen saturations, blood                            pressure, adequacy of pulmonary ventilation, and                            response to care were monitored throughout the                            procedure. The physical status of the patient was                            re-assessed after the procedure.                           After obtaining informed consent, the endoscope was                            passed under direct vision. Throughout the                            procedure, the patient's blood pressure, pulse, and                            oxygen saturations were monitored continuously. The  [id number]-[id number] ([id number]) Olympus endoscope was introduced                            through the mouth, and advanced to the second part                            of duodenum. The upper GI endoscopy was                            accomplished without difficulty. The patient                            tolerated the procedure well. Scope In: Scope Out: Findings:      The Z-line was irregular. This was biopsied with a cold forceps for       histology. Verification of patient identification for the specimen was       done. Estimated blood loss was minimal.      The gastroesophageal flap valve was visualized endoscopically and       classified as Hill Grade III (minimal fold, loose to endoscope, hiatal       hernia likely).      The entire examined stomach was normal. Biopsies were taken with a cold       forceps for Helicobacter pylori testing.      The in the duodenum was normal. Impression:               -  Z-line irregular. Biopsied.                           - Gastroesophageal flap valve classified as Hill                            Grade III (minimal fold, loose to endoscope, hiatal                            hernia likely).                           - Normal stomach. Biopsied.                           - Normal. Moderate Sedation:      Moderate (conscious) sedation was personally administered by an       anesthesia professional. The following parameters were monitored: oxygen       saturation, heart rate, blood pressure, and response to care. Total       physician intraservice time was 15 minutes. Recommendation:           - Await pathology results.                           - Return to my office in 2 weeks.                           - She may be at increased risk for reflux issues  after a sleeve based on the endoscopic appearance                            of the gastroesophageal junction. I would like to                            discuss plans for surgery in person in the office                            prior to proceeding with scheduling. Procedure Code(s):        --- Professional ---                           [phone], Esophagogastroduodenoscopy, flexible,                            transoral; with biopsy, single or multiple Diagnosis Code(s):        --- Professional ---                           K22.8, Other specified diseases of esophagus                           Z13.810, Encounter for screening for upper                            gastrointestinal disorder                           E66.09, Other obesity due to excess calories                           Z01.818, Encounter for other preprocedural                            examination CPT copyright 2019 American Medical Association. All rights reserved. The codes documented in this report are preliminary and upon coder review may  be revised to meet current compliance requirements. [place],   04/23/2021 2:08:51 PM This report has been signed electronically. Number of Addenda: 0

## 2021-04-26 ENCOUNTER — Encounter (HOSPITAL_COMMUNITY): Payer: Self-pay | Admitting: Surgery

## 2021-04-26 LAB — SURGICAL PATHOLOGY

## 2021-04-27 ENCOUNTER — Ambulatory Visit: Payer: Medicare Other

## 2021-04-30 ENCOUNTER — Other Ambulatory Visit: Payer: Self-pay

## 2021-04-30 ENCOUNTER — Ambulatory Visit
Admission: RE | Admit: 2021-04-30 | Discharge: 2021-04-30 | Disposition: A | Discharge status: Home / Self Care | Payer: Medicare Other | Source: Ambulatory Visit | Attending: Family Medicine | Admitting: Family Medicine

## 2021-04-30 DIAGNOSIS — Z1231 Encounter for screening mammogram for malignant neoplasm of breast: Secondary | ICD-10-CM

## 2021-06-01 ENCOUNTER — Telehealth: Payer: Self-pay | Admitting: Pharmacist

## 2021-06-01 NOTE — Chronic Care Management (AMB) (Signed)
    Chronic Care Management Pharmacy Assistant   Name: [name]  MRN: [id number] DOB: 05-08-1956     06/01/21 APPOINTMENT REMINDER  Patient was reminded to have all medications, supplements and any blood glucose and blood pressure readings available for review with Gaylord Shih, Pharm. D, for telephone visit on 06/02/21 at 2.    Care Gaps: Zoster Vaccines - Overdue PNA Vaccine- Overdue COVID Booster - Overdue TDAP- Overdue FLU Vaccine - Overdue AWV- 02/2021 BP- 150/88 (05/06/21) Lab Results  Component Value Date   HGBA1C 5.7 12/02/2019    Star Rating Drug: Lisinopril (Zestril) 40 mg - Last filled 02/03/21 90 DS  Rosuvastatin (Crestor) 10 mg - Last filled 11/02/20 90 DS at [facility] Semaglutide (Ozempic) 0.25 mg - Last filled 03/23/21 28 DS at [facility]  Any gaps in medications fill history? Yes Call to [facility] verified the above dates are accurate   Medications: Outpatient Encounter Medications as of 06/01/2021  Medication Sig   albuterol (VENTOLIN HFA) 108 (90 Base) MCG/ACT inhaler Inhale 1-2 puffs into the lungs every 6 (six) hours as needed for wheezing or shortness of breath.   budesonide-formoterol (SYMBICORT) 160-4.5 MCG/ACT inhaler Inhale 2 puffs into the lungs 2 (two) times daily.   cholecalciferol (VITAMIN D3) 25 MCG (1000 UNIT) tablet Take 1,000 Units by mouth daily.   diclofenac (VOLTAREN) 75 MG EC tablet Take 1 tablet (75 mg total) by mouth 2 (two) times daily as needed.   hydrochlorothiazide (HYDRODIURIL) 25 MG tablet Take 1 tablet (25 mg total) by mouth daily.   lidocaine (LIDODERM) 5 % Place 1 patch onto the skin daily as needed. Apply patch to area most significant pain once per day.  Remove and discard patch within 12 hours of application. (Patient taking differently: Place 1 patch onto the skin daily as needed. Apply patch to area most significant pain once per day.  Remove and discard patch within 12 hours of application.)   lisinopril (ZESTRIL)  40 MG tablet TAKE 1 TABLET BY MOUTH  DAILY   methocarbamol (ROBAXIN) 500 MG tablet Take 1 tablet (500 mg total) by mouth every 8 (eight) hours as needed for muscle spasms.   rosuvastatin (CRESTOR) 10 MG tablet Take 1 tablet (10 mg total) by mouth daily. (Patient not taking: No sig reported)   Semaglutide,0.25 or 0.5MG /DOS, (OZEMPIC, 0.25 OR 0.5 MG/DOSE,) 2 MG/1.5ML SOPN INJECT 0.25 MG UNDER THE SKIN ONCE A WEEK FOR 2 WEEKS, THEN INCREASE TO 0.5MG  ONCE A WEEK THEREFATER (Patient taking differently: Inject 0.25 mg into the skin every Tuesday. INJECT 0.25 MG UNDER THE SKIN ONCE A WEEK FOR 2 WEEKS, THEN INCREASE TO 0.5MG  ONCE A WEEK THEREFATER)   trolamine salicylate (ASPERCREME) 10 % cream Apply 1 application topically as needed for muscle pain.   No facility-administered encounter medications on file as of 06/01/2021.   Pamala Duffel CMA Clinical Pharmacist Assistant [phone]

## 2021-06-02 ENCOUNTER — Ambulatory Visit (INDEPENDENT_AMBULATORY_CARE_PROVIDER_SITE_OTHER): Payer: Medicare Other | Admitting: Pharmacist

## 2021-06-02 DIAGNOSIS — I10 Essential (primary) hypertension: Secondary | ICD-10-CM

## 2021-06-02 DIAGNOSIS — E785 Hyperlipidemia, unspecified: Secondary | ICD-10-CM

## 2021-06-02 NOTE — Patient Instructions (Signed)
Hi Leler,  It was great to catch up with you again! Go ahead and retry taking 1/2 of the rosuvastatin tablet to see if that causes any nausea and I will have Gypsy Decant follow up with you to see how that is going in a few weeks.  Please reach out to me if you have any questions or need anything before our follow up!  Best, Maddie  Gaylord Shih, PharmD, [facility] Clinical Pharmacist [facility] [place] [phone]   Visit Information   Goals Addressed   None    Patient Care Plan: CCM Pharmacy Care Plan     Problem Identified: Problem: Hypertension, Hyperlipidemia, Asthma, Depression, Allergic Rhinitis and Pre-diabetes, Chronic pain      Long-Range Goal: Patient-Specific Goal   Start Date: 12/02/2020  Expected End Date: 12/02/2021  Recent Progress: On track  Priority: High  Note:   Current Barriers:  Unable to independently monitor therapeutic efficacy  Pharmacist Clinical Goal(s):  Patient will achieve adherence to monitoring guidelines and medication adherence to achieve therapeutic efficacy through collaboration with PharmD and provider.   Interventions: 1:1 collaboration with [name], [name], MD regarding development and update of comprehensive plan of care as evidenced by provider attestation and co-signature Inter-disciplinary care team collaboration (see longitudinal plan of care) Comprehensive medication review performed; medication list updated in electronic medical record  Hypertension (BP goal <140/90) -Not ideally controlled -Current treatment: HCTZ 25 mg daily  Lisinopril 40 mg daily  -Medications previously tried: none  -Current home readings: 125-130/70-75 (arm cuff) -Current dietary habits: avoids salt and doesn't use it to season or cook with at all -Current exercise habits: nothing structured -Denies hypotensive/hypertensive symptoms -Educated on Exercise goal of 150 minutes per week; Importance of home blood pressure monitoring; Proper  BP monitoring technique; -Counseled to monitor BP at home weekly, document, and provide log at future appointments -Counseled on diet and exercise extensively Recommended to continue current medication  Hyperlipidemia: (LDL goal < 100) -Uncontrolled -Current treatment: Rosuvastatin 10 mg daily - not taking -Medications previously tried: none  -Current dietary patterns: did not discuss -Current exercise habits: was doing some chair exercises but not often -Educated on Cholesterol goals;  Benefits of statin for ASCVD risk reduction; Importance of limiting foods high in cholesterol; Exercise goal of 150 minutes per week; -Counseled on diet and exercise extensively Recommended to restart taking 1/2 tablet daily at least.  Pre-diabetes/weight loss (A1c goal <6.5%) -Controlled -Current medications: Ozempic 0.5 mg inject once weekly - not taking -Medications previously tried: none  -Current home glucose readings fasting glucose: does not need to check post prandial glucose: does not need to check -Denies hypoglycemic/hyperglycemic symptoms -Current meal patterns:  breakfast: did not discuss  lunch: did not discuss   dinner: did not discuss  snacks: did not discuss  drinks: did not discuss  -Current exercise: was doing chair exercises but does not do often -Educated on A1c and blood sugar goals; Exercise goal of 150 minutes per week; Carbohydrate counting and/or plate method -Counseled to check feet daily and get yearly eye exams -Counseled on diet and exercise extensively Recommended to continue current medication Patient requested a dose increase as her weight is at a standstill. Sent message to PCP.  Asthma (Goal: control symptoms) -Not ideally controlled -Current treatment  Albuterol as needed Symbicort 160-4.5 mcg/act 2 puffs twice daily -Medications previously tried: none  -Pulmonary function testing: n/a -Patient denies consistent use of maintenance  inhaler -Frequency of rescue inhaler use: not often -Counseled on Benefits of  consistent maintenance inhaler use When to use rescue inhaler Differences between maintenance and rescue inhalers -Recommended to use Symbicort daily as prescribed. Patient was worried about weight gain given steroid component but discussed low risk due to travel to the lungs  GERD (Goal: minimize symptoms) -Controlled -Current treatment  Pantoprazole 40 mg daily as needed -Medications previously tried: none  -Recommended to continue current medication   Chronic pain (Goal: minimize pain) -Controlled -Current treatment  Diclofenac 75 mg twice daily as needed Gabapentin 300 mg 1 cap AM, 1 cap Noon, 2 cap HS (sometimes will take 2nd capsule at lunch)  Hydrocodone-APAP 5-325 QID PRN (Rarely uses - only if runs out of other medications)  Methocarbamol 500 mg q8hr PRN (hasn't used)  Tizanidine 2 mg TID PRN (twice daily) - ran out Tramadol 50 mg BID PRN (hasn't used in 1-2 weeks) -Medications previously tried: n/a  - Discussed at length the importance of combining medications whenever possible to minimize risk of CNS depressants. Discussed avoiding methocarbamol and tizanidine due to duplication of therapy.   Health Maintenance -Vaccine gaps: shingrix, tetanus, influenza, Prevnar, COVID booster -Current therapy:  No medications -Educated on Cost vs benefit of each product must be carefully weighed by individual consumer -Patient is satisfied with current therapy and denies issues -Recommended to continue as is  Patient Goals/Self-Care Activities Patient will:  - take medications as prescribed check blood pressure weekly, document, and provide at future appointments target a minimum of 150 minutes of moderate intensity exercise weekly  Follow Up Plan: Telephone follow up appointment with care management team member scheduled for: 6 months       Patient verbalizes understanding of instructions  provided today and agrees to view in MyChart.  Telephone follow up appointment with pharmacy team member scheduled for: 6 months  Verner Chol, [facility]

## 2021-06-02 NOTE — Progress Notes (Signed)
Chronic Care Management Pharmacy Note  06/02/2021 Name:  [name] MRN:  [id number] DOB:  28-Mar-1956  Summary: LDL not at goal < 100 Pt stopped taking rosuvastatin and Ozempic on her own  Recommendations/Changes made from today's visit: -Recommended to bring BP cuff to office visit to ensure accuracy -Recommended for patient to restart rosuvastatin 1/2 tablet daily to see if she tolerates  Plan: Follow up in 3-4 weeks for tolerance of rosuvastatin Scheduled visit with PCP for back pain  Subjective: [name] is an 65 y.o. year old female who is a primary patient of [name], [name], MD.  The CCM team was consulted for assistance with disease management and care coordination needs.    Engaged with patient by telephone for follow up visit in response to provider referral for pharmacy case management and/or care coordination services.   Consent to Services:  The patient was given information about Chronic Care Management services, agreed to services, and gave verbal consent prior to initiation of services.  Please see initial visit note for detailed documentation.   Patient Care Team: [name], [name], MD as PCP - General (Family Medicine) Sueanne Margarita, MD as PCP - Cardiology (Cardiology) Viona Gilmore, [facility] as Pharmacist (Pharmacist)  Recent office visits: 02/17/21 [name], LPN: Patient presented for AWV.  02/11/21 [name], CMA: Patient presented for vitamin B12 injection.   07/21/20 [name], MD: Patient presented for video visit for COVID infection follow up and congestion. Restarted Sybmicort 2 puffs twice daily.  Recent consult visits: 05/06/21 [name], MD (bariatric): Patient presented for pre-op exam prior to bariatric surgery. Plan for robotic sleeve gastrectomy.  02/09/21 [name], RD (Nutrition): Patient presented for nutrition counseling for bariatric surgery planned.  11/04/20 Suella Broad, MD (phys med & rehab): Unable  to access notes.  11/02/20 [name], MD (cardiology): Patient presented for preop clearance. Increased Crestor to 10 mg daily.  09/22/20 [name], RD (Nutrition): Patient presented for nutrition counseling for bariatric surgery planned.  08/24/20 [name], RD (Nutrition): Patient presented for nutrition counseling for bariatric surgery planned.  07/30/20 [name], MD (cardiology): Patient presented for preop clearance initial consult. Started rosuvastatin 5 mg daily.  06/23/20 [name], MD (pulmonary): Patient presented for OSA follow up. Plan for sleep study.  Hospital visits: 04/23/21 Patient admitted to [facility] for EGD prior to bariatric surgery.  Objective:  Lab Results  Component Value Date   CREATININE 0.85 10/27/2020   BUN 14 10/27/2020   GFR 104.08 12/02/2019   GFRNONAA >60 07/09/2020   GFRAA 87 04/27/2020   NA 141 10/27/2020   K 4.3 10/27/2020   CALCIUM 9.9 10/27/2020   CO2 25 10/27/2020   GLUCOSE 89 10/27/2020    Lab Results  Component Value Date/Time   HGBA1C 5.7 12/02/2019 09:13 AM   HGBA1C 5.7 (H) 08/16/2018 12:07 PM   GFR 104.08 12/02/2019 09:13 AM   GFR 107.59 05/02/2018 11:24 AM   MICROALBUR 0.76 08/16/2007 09:19 PM    Last diabetic Eye exam: No results found for: HMDIABEYEEXA  Last diabetic Foot exam: No results found for: HMDIABFOOTEX   Lab Results  Component Value Date   CHOL 188 10/27/2020   HDL 43 10/27/2020   LDLCALC 128 (H) 10/27/2020   TRIG 91 10/27/2020   CHOLHDL 4.4 10/27/2020    Hepatic Function Latest Ref Rng & Units 10/27/2020 07/09/2020 12/02/2019  Total Protein 6.0 - 8.5 g/dL 7.3 7.2 6.4  Albumin 3.8 - 4.8  g/dL 4.4 3.5 3.8  AST 0 - 40 IU/L 11 14(L) 11  ALT 0 - 32 IU/L _0 Alk Phosphatase 44 - 121 IU/L 122(H) 106 105  Total Bilirubin 0.0 - 1.2 mg/dL 0.4 0.5 0.3  Bilirubin, Direct 0.0 - 0.3 mg/dL - - -    Lab Results  Component Value Date/Time   TSH 1.900 05/09/2018 10:07  AM   TSH 3.440 10/25/2017 08:59 AM   TSH 1.307 07/16/2014 12:02 PM   FREET4 1.20 05/09/2018 10:07 AM    CBC Latest Ref Rng & Units 07/09/2020 05/09/2018 11/07/2017  WBC 4.0 - 10.5 K/uL 5.0 5.1 5.3  Hemoglobin 12.0 - 15.0 g/dL 12.7 11.9 11.8(L)  Hematocrit 36.0 - 46.0 % 41.4 38.1 36.4  Platelets 150 - 400 K/uL 313 - 363.0    Lab Results  Component Value Date/Time   VD25OH 18.42 (L) 09/29/2020 03:11 PM   VD25OH 12.29 (L) 12/02/2019 09:13 AM    Clinical ASCVD: No  The 10-year ASCVD risk score (Arnett DK, et al., 2019) is: 12.9%   Values used to calculate the score:     Age: 65 years     Sex: Female     Is Non-Hispanic African American: Yes     Diabetic: No     Tobacco smoker: No     Systolic Blood Pressure: 967 mmHg     Is BP treated: Yes     HDL Cholesterol: 43 mg/dL     Total Cholesterol: 188 mg/dL    Depression screen [facility] 2/9 02/17/2021 02/12/2020 10/05/2018  Decreased Interest 0 0 1  Down, Depressed, Hopeless 0 0 1  PHQ - 2 Score 0 0 2  Altered sleeping - - 2  Tired, decreased energy - - 2  Change in appetite - - 2  Feeling bad or failure about yourself  - - 0  Trouble concentrating - - 0  Moving slowly or fidgety/restless - - 0  Suicidal thoughts - - 0  PHQ-9 Score - - 8  Difficult doing work/chores - - -  Some recent data might be hidden     Social History   Tobacco Use  Smoking Status Former   Packs/day: 0.50   Years: 20.00   Pack years: 10.00   Types: Cigarettes   Quit date: 09/10/1998   Years since quitting: 22.7  Smokeless Tobacco Never  Tobacco Comments   65 yo    BP Readings from Last 3 Encounters:  04/23/21 (!) 143/72  11/02/20 126/74  07/30/20 (!) 150/68   Pulse Readings from Last 3 Encounters:  04/23/21 87  11/02/20 67  07/30/20 77   Wt Readings from Last 3 Encounters:  04/23/21 (!) 348 lb (157.9 kg)  02/09/21 (!) 348 lb 8 oz (158.1 kg)  01/12/21 (!) 350 lb 8 oz (159 kg)   BMI Readings from Last 3 Encounters:  04/23/21 57.91 kg/m   02/09/21 57.99 kg/m  01/12/21 58.33 kg/m    Assessment/Interventions: Review of patient past medical history, allergies, medications, health status, including review of consultants reports, laboratory and other test data, was performed as part of comprehensive evaluation and provision of chronic care management services.   SDOH:  (Social Determinants of Health) assessments and interventions performed: No  SDOH Screenings   Alcohol Screen: Not on file  Depression (PHQ2-9): Low Risk    PHQ-2 Score: 0  Financial Resource Strain: Low Risk    Difficulty of Paying Living Expenses: Not hard at all  Food Insecurity: No Food Insecurity  Worried About Charity fundraiser in the Last Year: Never true   Ran Out of Food in the Last Year: Never true  Housing: Low Risk    Last Housing Risk Score: 0  Physical Activity: Insufficiently Active   Days of Exercise per Week: 2 days   Minutes of Exercise per Session: 10 min  Social Connections: Moderately Integrated   Frequency of Communication with Friends and Family: More than three times a week   Frequency of Social Gatherings with Friends and Family: Twice a week   Attends Religious Services: 1 to 4 times per year   Active Member of [employer] or Organizations: No   Attends Music therapist: Never   Marital Status: Married  Stress: No Stress Concern Present   Feeling of Stress : Not at all  Tobacco Use: Medium Risk   Smoking Tobacco Use: Former   Smokeless Tobacco Use: Never   Passive Exposure: Not on Pensions consultant Needs: No Transportation Needs   Lack of Transportation (Medical): No   Lack of Transportation (Non-Medical): No    CCM Care Plan  Allergies  Allergen Reactions   Ketorolac Tromethamine Shortness Of Breath and Palpitations   Atrovent [Ipratropium] Hives and Rash   Latex Rash and Other (See Comments)    NO POWDERED GLOVES!!!!   Tape Dermatitis    Irritates skin     Medications Reviewed Today      Reviewed by Viona Gilmore, [facility] (Pharmacist) on 06/02/21 at [place] List Status: <None>   Medication Order Taking? Sig Documenting Provider Last Dose Status Informant  albuterol (VENTOLIN HFA) 108 (90 Base) MCG/ACT inhaler [id number]  Inhale 1-2 puffs into the lungs every 6 (six) hours as needed for wheezing or shortness of breath. [name], [name], MD  Active Self  budesonide-formoterol [facility]) 160-4.5 MCG/ACT inhaler [id number]  Inhale 2 puffs into the lungs 2 (two) times daily. [name], [name], MD  Active Self  cholecalciferol (VITAMIN D3) 25 MCG (1000 UNIT) tablet [id number]  Take 1,000 Units by mouth daily. [provider]  Active Self  diclofenac (VOLTAREN) 75 MG EC tablet [id number]  Take 1 tablet (75 mg total) by mouth 2 (two) times daily as needed. [name], [name], MD  Active Self  hydrochlorothiazide (HYDRODIURIL) 25 MG tablet [id number]  Take 1 tablet (25 mg total) by mouth daily. [name], [name], MD  Active Self  lisinopril (ZESTRIL) 40 MG tablet [id number]  TAKE 1 TABLET BY MOUTH  DAILY [name], [name], MD  Active Self  rosuvastatin (CRESTOR) 10 MG tablet [id number] No Take 1 tablet (10 mg total) by mouth daily.  Patient not taking: Reported on 04/19/2021   Elouise Munroe, MD Not Taking Active Self  Semaglutide,0.25 or 0.5MG/DOS, (OZEMPIC, 0.25 OR 0.5 MG/DOSE,) 2 MG/1.5ML SOPN [id number] No INJECT 0.25 MG UNDER THE SKIN ONCE A WEEK FOR 2 WEEKS, THEN INCREASE TO 0.5MG ONCE A WEEK THEREFATER  Patient not taking: Reported on 06/02/2021   [name], [name], MD Not Taking Active Self  trolamine salicylate (ASPERCREME) 10 % cream [id number] Yes Apply 1 application topically as needed for muscle pain. [provider] Taking Active Self            Patient Active Problem List   Diagnosis Date Noted   Radiculopathy due to lumbar intervertebral disc disorder 12/02/2019   Depression 12/11/2018   Vitamin D deficiency 07/18/2018   Prediabetes 07/18/2018   Morbid obesity  ([place]) 07/18/2018   Insomnia 11/06/2017  Chest pain 10/25/2017   Asthma in adult, mild intermittent, uncomplicated 99/37/1696   Hypertension 10/25/2017   Obstructive sleep apnea 10/25/2017   Left ventricular diastolic dysfunction 78/93/8101   Positive D dimer 10/25/2017   Low vitamin B12 level 10/25/2017   Depression, major, single episode, complete remission ([place]) 09/29/2017   Pain due to total left knee replacement ([place]) 10/21/2016   B12 nutritional deficiency 09/27/2016   Hyperlipidemia 04/02/2012   Lower abdominal pain 12/30/2010   Preventative health care 09/10/2010   LUNG NODULE 05/10/2010   Low back pain 01/20/2009   Generalized osteoarthritis of multiple sites (left hip,knee,and lower back) 06/02/2008   ANEMIA-NOS 08/15/2006   Essential hypertension 08/11/2006   Allergic rhinitis 08/11/2006    Immunization History  Administered Date(s) Administered   Influenza Split 03/29/2012   Influenza,inj,Quad PF,6+ Mos 04/09/2013, 09/23/2014, 05/04/2015, 04/04/2016, 03/20/2017, 05/02/2018, 04/27/2020   Moderna Sars-Covid-2 Vaccination 05/07/2020, 06/07/2020   Pneumococcal Polysaccharide-23 03/29/2012, 04/04/2016   Tdap 10/14/2010   Patient reports her biggest concern right now is her back pain. She ran out of the tizanidine and needs a refill. Pt reports the lidocaine patches aren't helping and she hasn't been using the Voltaren gel because she is taking diclofenac tablets. Scheduled PCP follow up.   Patient is supposed to have bariatric surgery and they are calling her back for the schedule for January. The Ozempic has had her at a standstill and she hasn't taken it for the last few months.   Conditions to be addressed/monitored:  Hypertension, Hyperlipidemia, Asthma, Depression, Allergic Rhinitis and Pre-diabetes, Chronic pain  Conditions addressed this visit: Chronic pain, hyperlipidemia, hypertension  Care Plan : CCM Pharmacy Care Plan  Updates made by Viona Gilmore,  [place] since 06/02/2021 12:00 AM     Problem: Problem: Hypertension, Hyperlipidemia, Asthma, Depression, Allergic Rhinitis and Pre-diabetes, Chronic pain      Long-Range Goal: Patient-Specific Goal   Start Date: 12/02/2020  Expected End Date: 12/02/2021  Recent Progress: On track  Priority: High  Note:   Current Barriers:  Unable to independently monitor therapeutic efficacy  Pharmacist Clinical Goal(s):  Patient will achieve adherence to monitoring guidelines and medication adherence to achieve therapeutic efficacy through collaboration with PharmD and provider.   Interventions: 1:1 collaboration with [name], [name], MD regarding development and update of comprehensive plan of care as evidenced by provider attestation and co-signature Inter-disciplinary care team collaboration (see longitudinal plan of care) Comprehensive medication review performed; medication list updated in electronic medical record  Hypertension (BP goal <140/90) -Not ideally controlled -Current treatment: HCTZ 25 mg daily  Lisinopril 40 mg daily  -Medications previously tried: none  -Current home readings: 125-130/70-75 (arm cuff) -Current dietary habits: avoids salt and doesn't use it to season or cook with at all -Current exercise habits: nothing structured -Denies hypotensive/hypertensive symptoms -Educated on Exercise goal of 150 minutes per week; Importance of home blood pressure monitoring; Proper BP monitoring technique; -Counseled to monitor BP at home weekly, document, and provide log at future appointments -Counseled on diet and exercise extensively Recommended to continue current medication  Hyperlipidemia: (LDL goal < 100) -Uncontrolled -Current treatment: Rosuvastatin 10 mg daily - not taking -Medications previously tried: none  -Current dietary patterns: did not discuss -Current exercise habits: was doing some chair exercises but not often -Educated on Cholesterol goals;  Benefits of  statin for ASCVD risk reduction; Importance of limiting foods high in cholesterol; Exercise goal of 150 minutes per week; -Counseled on diet and exercise extensively Recommended to restart taking 1/2  tablet daily at least.  Pre-diabetes/weight loss (A1c goal <6.5%) -Controlled -Current medications: Ozempic 0.5 mg inject once weekly - not taking -Medications previously tried: none  -Current home glucose readings fasting glucose: does not need to check post prandial glucose: does not need to check -Denies hypoglycemic/hyperglycemic symptoms -Current meal patterns:  breakfast: did not discuss  lunch: did not discuss   dinner: did not discuss  snacks: did not discuss  drinks: did not discuss  -Current exercise: was doing chair exercises but does not do often -Educated on A1c and blood sugar goals; Exercise goal of 150 minutes per week; Carbohydrate counting and/or plate method -Counseled to check feet daily and get yearly eye exams -Counseled on diet and exercise extensively Recommended to continue current medication Patient requested a dose increase as her weight is at a standstill. Sent message to PCP.  Asthma (Goal: control symptoms) -Not ideally controlled -Current treatment  Albuterol as needed Symbicort 160-4.5 mcg/act 2 puffs twice daily -Medications previously tried: none  -Pulmonary function testing: n/a -Patient denies consistent use of maintenance inhaler -Frequency of rescue inhaler use: not often -Counseled on Benefits of consistent maintenance inhaler use When to use rescue inhaler Differences between maintenance and rescue inhalers -Recommended to use Symbicort daily as prescribed. Patient was worried about weight gain given steroid component but discussed low risk due to travel to the lungs  GERD (Goal: minimize symptoms) -Controlled -Current treatment  Pantoprazole 40 mg daily as needed -Medications previously tried: none  -Recommended to continue  current medication   Chronic pain (Goal: minimize pain) -Controlled -Current treatment  Diclofenac 75 mg twice daily as needed Gabapentin 300 mg 1 cap AM, 1 cap Noon, 2 cap HS (sometimes will take 2nd capsule at lunch)  Hydrocodone-APAP 5-325 QID PRN (Rarely uses - only if runs out of other medications)  Methocarbamol 500 mg q8hr PRN (hasn't used)  Tizanidine 2 mg TID PRN (twice daily) - ran out Tramadol 50 mg BID PRN (hasn't used in 1-2 weeks) -Medications previously tried: n/a  - Discussed at length the importance of combining medications whenever possible to minimize risk of CNS depressants. Discussed avoiding methocarbamol and tizanidine due to duplication of therapy.   Health Maintenance -Vaccine gaps: shingrix, tetanus, influenza, Prevnar, COVID booster -Current therapy:  No medications -Educated on Cost vs benefit of each product must be carefully weighed by individual consumer -Patient is satisfied with current therapy and denies issues -Recommended to continue as is  Patient Goals/Self-Care Activities Patient will:  - take medications as prescribed check blood pressure weekly, document, and provide at future appointments target a minimum of 150 minutes of moderate intensity exercise weekly  Follow Up Plan: Telephone follow up appointment with care management team member scheduled for: 6 months        Medication Assistance: None required.  Patient affirms current coverage meets needs.  Compliance/Adherence/Medication fill history: Care Gaps: Shingrix, COVID booster, influenza, tetanus, Prevnar 20 A1c: 5.7% BP- 150/88 (05/06/21)  Star-Rating Drugs: Lisinopril (Zestril) 40 mg - Last filled 02/03/21 90 DS  Rosuvastatin (Crestor) 10 mg - Last filled 11/02/20 90 DS at [facility] (Ozempic) 0.25 mg - Last filled 03/23/21 28 DS at [facility]  Patient's preferred pharmacy is:  [facility] - [address], [address] [place] [address] Phone: [phone] Fax: [phone]  Uses pill box? Yes Pt endorses 100% compliance  We discussed: Current pharmacy is preferred with insurance plan and  patient is satisfied with pharmacy services Patient decided to: Continue current medication management strategy  Care Plan and Follow Up Patient Decision:  Patient agrees to Care Plan and Follow-up.  Plan: Telephone follow up appointment with care management team member scheduled for:  6 months  Jeni Salles, PharmD [place] Pharmacist [place] [facility] [place] [phone]

## 2021-06-07 NOTE — Progress Notes (Deleted)
ACUTE VISIT No chief complaint on file.  HPI: [name] is a 65 y.o. female, who is here today complaining of *** HPI  Review of Systems Rest see pertinent positives and negatives per HPI.  Current Outpatient Medications on File Prior to Visit  Medication Sig Dispense Refill   albuterol (VENTOLIN HFA) 108 (90 Base) MCG/ACT inhaler Inhale 1-2 puffs into the lungs every 6 (six) hours as needed for wheezing or shortness of breath. 3 each 0   budesonide-formoterol (SYMBICORT) 160-4.5 MCG/ACT inhaler Inhale 2 puffs into the lungs 2 (two) times daily. 3 each 4   cholecalciferol (VITAMIN D3) 25 MCG (1000 UNIT) tablet Take 1,000 Units by mouth daily.     diclofenac (VOLTAREN) 75 MG EC tablet Take 1 tablet (75 mg total) by mouth 2 (two) times daily as needed. 40 tablet 0   hydrochlorothiazide (HYDRODIURIL) 25 MG tablet Take 1 tablet (25 mg total) by mouth daily. 90 tablet 3   lisinopril (ZESTRIL) 40 MG tablet TAKE 1 TABLET BY MOUTH  DAILY 90 tablet 3   rosuvastatin (CRESTOR) 10 MG tablet Take 1 tablet (10 mg total) by mouth daily. (Patient not taking: Reported on 04/19/2021) 30 tablet 3   Semaglutide,0.25 or 0.5MG /DOS, (OZEMPIC, 0.25 OR 0.5 MG/DOSE,) 2 MG/1.5ML SOPN INJECT 0.25 MG UNDER THE SKIN ONCE A WEEK FOR 2 WEEKS, THEN INCREASE TO 0.5MG  ONCE A WEEK THEREFATER (Patient not taking: Reported on 06/02/2021) 1.5 mL 0   trolamine salicylate (ASPERCREME) 10 % cream Apply 1 application topically as needed for muscle pain.     No current facility-administered medications on file prior to visit.     Past Medical History:  Diagnosis Date   Abnormal EKG 06/2003   History of inverted T waves V1-V3. Normal 2D echo (07/23/2003): LVEF 65%.   Anemia    BL Hgb 11-12. Ferritin 14 - low normal (08/2007). Colonoscopy 2009 - external hemorrhoids (excellent prep). Last anemia panel (12/2010) - Iron  24, TIBC 269,  B12  316, Folate 11.9, Ferritin 73.   Asthma    B12 deficiency    Back pain     Chest pain    Degenerative joint disease    BL knees (L>R), lumbar spine. Followed by Sports Medicine, Dr. Jennette Kettle.   Dyspnea    History of multiple pulmonary nodules    Incidental finding: CT Abd/ Pelvis (04/2010) - Several small lower lobe lung nodules, including one pure ground-glass pulmonary nodule measuring 8 mm in the left lower lobe.  Recommend follow-up chest CT (IV contrast preferred) in 6 months to document stability. //  CT Abd/ Pelvis (07/2010) -  3 mm RLL and  8 mm LLL nodule stable.  Other nodules unchanged, likely benign.   Hyperlipidemia    Hypertension    Incarcerated ventral hernia 04/2010   Noted on CT Abd/ pelvis (04/2010). Patient now s/p ventral hernia repair by Dr. 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 2D echo (2005) - EF 65%.   Obesity    OSA (obstructive sleep apnea)    Palpitations    Positive D dimer    Right knee pain    Allergies  Allergen Reactions   Ketorolac Tromethamine Shortness Of Breath and Palpitations   Atrovent [Ipratropium] Hives and Rash  Latex Rash and Other (See Comments)    NO POWDERED GLOVES!!!!   Tape Dermatitis    Irritates skin     Social History   Socioeconomic History   Marital status: Married    Spouse name: Tax adviser   Number of children: 3   Years of education: Not on file   Highest education level: Not on file  Occupational History   Occupation: Stay at home  Tobacco Use   Smoking status: Former    Packs/day: 0.50    Years: 20.00    Pack years: 10.00    Types: Cigarettes    Quit date: 09/10/1998    Years since quitting: 22.7   Smokeless tobacco: Never   Tobacco comments:    65 yo   Vaping Use   Vaping Use: Never used  Substance and Sexual Activity   Alcohol use: No    Alcohol/week: 0.0 standard drinks   Drug use: No   Sexual activity: Yes     Partners: Male  Other Topics Concern   Not on file  Social History Narrative   10/05/2018: Lives with husband, daughter, and 2 grandchildren in [place] house. Lives on main level though mostly.   Has three children altogether, all local, supportive   Currently getting ramp installed in house d/t pt difficulty getting upstairs.      Social Determinants of Health   Financial Resource Strain: Low Risk    Difficulty of Paying Living Expenses: Not hard at all  Food Insecurity: No Food Insecurity   Worried About Programme researcher, broadcasting/film/video in the Last Year: Never true   Ran Out of Food in the Last Year: Never true  Transportation Needs: No Transportation Needs   Lack of Transportation (Medical): No   Lack of Transportation (Non-Medical): No  Physical Activity: Insufficiently Active   Days of Exercise per Week: 2 days   Minutes of Exercise per Session: 10 min  Stress: No Stress Concern Present   Feeling of Stress : Not at all  Social Connections: Moderately Integrated   Frequency of Communication with Friends and Family: More than three times a week   Frequency of Social Gatherings with Friends and Family: Twice a week   Attends Religious Services: 1 to 4 times per year   Active Member of [employer] or Organizations: No   Attends Banker Meetings: Never   Marital Status: Married    There were no vitals filed for this visit. There is no height or weight on file to calculate BMI.  Physical Exam  ASSESSMENT AND PLAN:  There are no diagnoses linked to this encounter.   No follow-ups on file.   [name]. [name], MD  [facility]. [place] office.  Discharge Instructions   None

## 2021-06-08 ENCOUNTER — Ambulatory Visit: Payer: Medicare Other | Admitting: Family Medicine

## 2021-06-09 DIAGNOSIS — R7303 Prediabetes: Secondary | ICD-10-CM | POA: Diagnosis not present

## 2021-06-09 DIAGNOSIS — G8929 Other chronic pain: Secondary | ICD-10-CM

## 2021-06-09 DIAGNOSIS — I10 Essential (primary) hypertension: Secondary | ICD-10-CM

## 2021-06-09 DIAGNOSIS — J45909 Unspecified asthma, uncomplicated: Secondary | ICD-10-CM

## 2021-06-09 DIAGNOSIS — E785 Hyperlipidemia, unspecified: Secondary | ICD-10-CM | POA: Diagnosis not present

## 2021-06-09 DIAGNOSIS — Z87891 Personal history of nicotine dependence: Secondary | ICD-10-CM

## 2021-06-11 NOTE — Progress Notes (Signed)
Virtual Visit via Video Note I connected with [name] on 06/14/21 by a video enabled telemedicine application and verified that I am speaking with the correct person using two identifiers.  Location patient: home Location provider:work office Persons participating in the virtual visit: patient, provider  I discussed the limitations of evaluation and management by telemedicine and the availability of in person appointments. The patient expressed understanding and agreed to proceed.  Chief Complaint  Patient presents with   Back Pain   Chills   HPI: [name] is a 65 year old female with history of HTN,asthma,chronic pain,depression,and HLD complaining of 3 to 4 days of respiratory symptoms and back pain. Chills,fatigue,body aches,sore throat, nasal congestion,rhinorrhea,non productive cough, "little" wheezing, nausea,and diarrhea. Earache when coughing.  2 loose stools today and 3 yesterday. No blood or mucus in stool. Max temp 99 F. Negative for anosmia,ageusia, CP,SOB,vomiting,urinary symptoms,or skin rash. She has not tried OTC meds, home remedies,lemon.  Asthma on albuterol inhaler, last used yesterday, it did help with wheezing. She is not taking Symbicort 160-4.5 mcg, she has it at home.  Generalized OA and back pain, currently she is on diclofenac 75 mg EC twice daily as needed. Back pain exacerbated by certain movements and with cough.  She has taken Zanaflex in the past, which has helped, she would like another prescription.  Hypertension: She is checking BP at home, and it is "good." She is currently on lisinopril 40 mg daily and HCTZ 25 mg daily.  Lab Results  Component Value Date   CREATININE 0.85 10/27/2020   BUN 14 10/27/2020   NA 141 10/27/2020   K 4.3 10/27/2020   CL 101 10/27/2020   CO2 25 10/27/2020    Obesity: Ozempic is not helping much.  She is pursuing bariatric procedure, waiting for date of surgery. She is not exercising regularly due to  chronic pain.  ROS: See pertinent positives and negatives per HPI.  Past Medical History:  Diagnosis Date   Abnormal EKG 06/2003   History of inverted T waves V1-V3. Normal 2D echo (07/23/2003): LVEF 65%.   Anemia    BL Hgb 11-12. Ferritin 14 - low normal (08/2007). Colonoscopy 2009 - external hemorrhoids (excellent prep). Last anemia panel (12/2010) - Iron  24, TIBC 269,  B12  316, Folate 11.9, Ferritin 73.   Asthma    B12 deficiency    Back pain    Chest pain    Degenerative joint disease    BL knees (L>R), lumbar spine. Followed by Sports Medicine, Dr. Jennette Kettle.   Dyspnea    History of multiple pulmonary nodules    Incidental finding: CT Abd/ Pelvis (04/2010) - Several small lower lobe lung nodules, including one pure ground-glass pulmonary nodule measuring 8 mm in the left lower lobe.  Recommend follow-up chest CT (IV contrast preferred) in 6 months to document stability. //  CT Abd/ Pelvis (07/2010) -  3 mm RLL and  8 mm LLL nodule stable.  Other nodules unchanged, likely benign.   Hyperlipidemia    Hypertension    Incarcerated ventral hernia 04/2010   Noted on CT Abd/ pelvis (04/2010). Patient now s/p ventral hernia repair by Dr. 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 2D echo (2005) - EF 65%.   Obesity  OSA (obstructive sleep apnea)    Palpitations    Positive D dimer    Right knee pain     Past Surgical History:  Procedure Laterality Date   BIOPSY  04/23/2021   Procedure: BIOPSY;  Surgeon: Quentin Ore, MD;  Location: [facility];  Service: General;;   COLONOSCOPY  2009   COLONOSCOPY WITH PROPOFOL N/A 12/18/2017   Procedure: COLONOSCOPY WITH PROPOFOL;  Surgeon: Iva Boop, MD;  Location: [facility];  Service: Endoscopy;  Laterality: N/A;   ESOPHAGOGASTRODUODENOSCOPY N/A 04/23/2021    Procedure: ESOPHAGOGASTRODUODENOSCOPY (EGD);  Surgeon: Quentin Ore, MD;  Location: [facility];  Service: General;  Laterality: N/A;   FOOT SURGERY  2004    left   INCISION AND DRAINAGE ABSCESS ANAL  07/2008   I&D and debridgement of anorectal abscess   INCISIONAL HERNIA REPAIR  12/2010   Repair of incarcerated ventral incisional hernia with Ethicon mesh patch - performed by Dr. Gerrit Friends.    INGUINAL HERNIA REPAIR     JOINT REPLACEMENT Left 2013   left knee   TOTAL KNEE ARTHROPLASTY  06/16/2011   Left TOTAL KNEE ARTHROPLASTY;  Surgeon: Kennieth Rad;  Location: [facility];  Service: Orthopedics;  Laterality: Left;  left total knee arthroplasty   TUBAL LIGATION      Family History  Problem Relation Age of Onset   Diabetes Mother    Hypertension Mother    Stroke Mother    Hyperlipidemia Mother    Heart disease Mother    Sudden death Mother    Kidney disease Mother    Depression Mother    Dementia Father    Heart disease Father    Hyperlipidemia Father    Sudden death Father    Depression Father    Hypertension Sister    Diabetes Brother    Hypertension Brother    Rashes / Skin problems Maternal Grandfather    Heart attack Neg Hx     Social History   Socioeconomic History   Marital status: Married    Spouse name: Tax adviser   Number of children: 3   Years of education: Not on file   Highest education level: Some college, no degree  Occupational History   Occupation: Stay at home  Tobacco Use   Smoking status: Former    Packs/day: 0.50    Years: 20.00    Pack years: 10.00    Types: Cigarettes    Quit date: 09/10/1998    Years since quitting: 22.7   Smokeless tobacco: Never   Tobacco comments:    65 yo   Vaping Use   Vaping Use: Never used  Substance and Sexual Activity   Alcohol use: No    Alcohol/week: 0.0 standard drinks   Drug use: No   Sexual activity: Yes    Partners: Male  Other Topics Concern   Not on file  Social History Narrative   10/05/2018:  Lives with husband, daughter, and 2 grandchildren in [place] house. Lives on main level though mostly.   Has three children altogether, all local, supportive   Currently getting ramp installed in house d/t pt difficulty getting upstairs.      Social Determinants of Health   Financial Resource Strain: Low Risk    Difficulty of Paying Living Expenses: Not very hard  Food Insecurity: No Food Insecurity   Worried About Running Out of Food in the Last Year: Never true   Ran Out of Food in the Last Year: Never true  Transportation  Needs: Unmet Transportation Needs   Lack of Transportation (Medical): No   Lack of Transportation (Non-Medical): Yes  Physical Activity: Unknown   Days of Exercise per Week: Patient refused   Minutes of Exercise per Session: 10 min  Stress: No Stress Concern Present   Feeling of Stress : Only a little  Social Connections: Moderately Integrated   Frequency of Communication with Friends and Family: More than three times a week   Frequency of Social Gatherings with Friends and Family: Once a week   Attends Religious Services: More than 4 times per year   Active Member of [employer] or Organizations: No   Attends Engineer, structural: Never   Marital Status: Married  Catering manager Violence: Not At Risk   Fear of Current or Ex-Partner: No   Emotionally Abused: No   Physically Abused: No   Sexually Abused: No   Current Outpatient Medications:    albuterol (VENTOLIN HFA) 108 (90 Base) MCG/ACT inhaler, Inhale 1-2 puffs into the lungs every 6 (six) hours as needed for wheezing or shortness of breath., Disp: 3 each, Rfl: 0   budesonide-formoterol (SYMBICORT) 160-4.5 MCG/ACT inhaler, Inhale 2 puffs into the lungs 2 (two) times daily., Disp: 3 each, Rfl: 4   cholecalciferol (VITAMIN D3) 25 MCG (1000 UNIT) tablet, Take 1,000 Units by mouth daily., Disp: , Rfl:    diclofenac (VOLTAREN) 75 MG EC tablet, Take 1 tablet (75 mg total) by mouth 2 (two) times daily as  needed., Disp: 40 tablet, Rfl: 0   hydrochlorothiazide (HYDRODIURIL) 25 MG tablet, Take 1 tablet (25 mg total) by mouth daily., Disp: 90 tablet, Rfl: 3   lisinopril (ZESTRIL) 40 MG tablet, TAKE 1 TABLET BY MOUTH  DAILY, Disp: 90 tablet, Rfl: 3   rosuvastatin (CRESTOR) 10 MG tablet, Take 1 tablet (10 mg total) by mouth daily., Disp: 30 tablet, Rfl: 3   tiZANidine (ZANAFLEX) 4 MG tablet, Take 1 tablet (4 mg total) by mouth every 12 (twelve) hours as needed for muscle spasms., Disp: 60 tablet, Rfl: 3   trolamine salicylate (ASPERCREME) 10 % cream, Apply 1 application topically as needed for muscle pain., Disp: , Rfl:   EXAM:  VITALS per patient if applicable:Ht 5\' 5"  (1.651 m)   LMP 09/09/2009   BMI 57.91 kg/m   GENERAL: alert, oriented, appears well and in no acute distress  HEENT: atraumatic, conjunctiva clear, no obvious abnormalities on inspection of external nose and ears  NECK: normal movements of the head and neck  LUNGS: on inspection no signs of respiratory distress, breathing rate appears normal, no obvious gross SOB, gasping or wheezing  CV: no obvious cyanosis  MS: moves all visible extremities without noticeable abnormality  PSYCH/NEURO: pleasant and cooperative, no obvious depression or anxiety, speech and thought processing grossly intact  ASSESSMENT AND PLAN:  Discussed the following assessment and plan: Orders Placed This Encounter  Procedures   Basic metabolic panel   Mild intermittent asthma with acute exacerbation - Plan: predniSONE (DELTASONE) 20 MG tablet After discussion of some side effects, she agrees with short course, 40 mg x 3 d. Albuterol inh 2 puff every 6 hours for a week then as needed for wheezing or shortness of breath.  Start Symbicort 160-4.5 mcg 2 puff bid. I do not think CXR is needed at this time.  URI, acute Symptoms suggests a viral etiology, recommend symptomatic treatment. She has COVID 19 test at home, instructed to have one done  today and to let me know result. Tylenol  500 mg 3-4 times daily as needed. Plenty of po fluids and rest. Instructed to monitor for signs of complications, including new onset of fever among some, clearly instructed about warning signs. I also explained that cough and nasal congestion can last a few days and sometimes weeks. F/U as needed.  Essential hypertension Based on patient report, BP seems to be adequately controlled. Continue lisinopril 20 mg daily. Continue monitoring BP regularly and low-salt diet.   Low back pain This is a chronic problem, Zanaflex has helped in the past. I do not think imaging needed at this time. Zanaflex 2 to 4 mg every 12 hours, we discussed some side effects. Weight loss will definitely help.  Morbid obesity (HCC) BMI 57. Encouraged consistency with following a healthful diet and regular physical activity as tolerated. Explained that pharmacologic treatment is intended for short period time. Ozempic is no longer helping, so discontinued. Pending date for bariatric procedure.  We discussed possible serious and likely etiologies, options for evaluation and workup, limitations of telemedicine visit vs in person visit, treatment, treatment risks and precautions. The patient was advised to call back or seek an in-person evaluation if the symptoms worsen or if the condition fails to improve as anticipated. I discussed the assessment and treatment plan with the patient. The patient was provided an opportunity to ask questions and all were answered. The patient agreed with the plan and demonstrated an understanding of the instructions.   Return if symptoms worsen or fail to improve, for Needs lab appt..  Corran Lalone G. [name], MD  [facility]. [place] office.

## 2021-06-14 ENCOUNTER — Encounter: Payer: Self-pay | Admitting: Family Medicine

## 2021-06-14 ENCOUNTER — Telehealth (INDEPENDENT_AMBULATORY_CARE_PROVIDER_SITE_OTHER): Payer: Medicare Other | Admitting: Family Medicine

## 2021-06-14 VITALS — Ht 65.0 in

## 2021-06-14 DIAGNOSIS — J069 Acute upper respiratory infection, unspecified: Secondary | ICD-10-CM

## 2021-06-14 DIAGNOSIS — M545 Low back pain, unspecified: Secondary | ICD-10-CM | POA: Diagnosis not present

## 2021-06-14 DIAGNOSIS — I10 Essential (primary) hypertension: Secondary | ICD-10-CM | POA: Diagnosis not present

## 2021-06-14 DIAGNOSIS — J4521 Mild intermittent asthma with (acute) exacerbation: Secondary | ICD-10-CM | POA: Diagnosis not present

## 2021-06-14 DIAGNOSIS — G8929 Other chronic pain: Secondary | ICD-10-CM

## 2021-06-14 MED ORDER — TIZANIDINE HCL 4 MG PO TABS: 4.0000 mg | ORAL_TABLET | Freq: Two times a day (BID) | ORAL | 3 refills | Status: DC | PRN

## 2021-06-14 MED ORDER — PREDNISONE 20 MG PO TABS: 40.0000 mg | ORAL_TABLET | Freq: Every day | ORAL | 0 refills | Status: AC

## 2021-06-14 NOTE — Assessment & Plan Note (Addendum)
BMI 57. Encouraged consistency with following a healthful diet and regular physical activity as tolerated. Explained that pharmacologic treatment is intended for short period time. Ozempic is no longer helping, so discontinued. Pending date for bariatric procedure.

## 2021-06-14 NOTE — Assessment & Plan Note (Signed)
This is a chronic problem, Zanaflex has helped in the past. I do not think imaging needed at this time. Zanaflex 2 to 4 mg every 12 hours, we discussed some side effects. Weight loss will definitely help.

## 2021-06-14 NOTE — Assessment & Plan Note (Signed)
Based on patient report, BP seems to be adequately controlled. Continue lisinopril 20 mg daily. Continue monitoring BP regularly and low-salt diet.

## 2021-06-18 ENCOUNTER — Encounter: Payer: Self-pay | Admitting: Family Medicine

## 2021-06-23 ENCOUNTER — Other Ambulatory Visit: Payer: Medicare Other

## 2021-06-24 ENCOUNTER — Ambulatory Visit (INDEPENDENT_AMBULATORY_CARE_PROVIDER_SITE_OTHER): Payer: Medicare Other | Admitting: Psychology

## 2021-06-24 DIAGNOSIS — F509 Eating disorder, unspecified: Secondary | ICD-10-CM

## 2021-06-24 NOTE — Progress Notes (Addendum)
Date of Evaluation: 06/24/2021 Time Seen: 10-10:15am (appointment) and 10:15-10:40am (report writing, scoring, and interpreting) Duration: 40 total minutes Type of Session: Appointment for Evaluation  Location of Patient: Home (private location) Location of Provider: At home in a private office due to COVID-19 pandemic Type of Contact: Caregility video visit with audio  [name] participated in the session via Caregility video visit with audio, from the privacy of their home due to the COVID-19 pandemic. [name] provide verbal consent to proceed with today's appointment. This provider participated from a private home office. During today's appointment, [name] and this provider discussed any changes that have occurred since the last appointment with this provider. An addendum was attached to her report with the aforementioned information. Please see the bariatric evaluation uploaded to [facility] for additional information. [name] continued to provide verbal consent for her evaluation being sent to [facility].   Mental Status Examination:  Appearance:  neat Behavior: appropriate to circumstances Mood: euthymic Affect: mood congruent Speech: WNL Eye Contact: appropriate Psychomotor Activity: WNL Thought Process: linear, logical, and goal directed and denies suicidal, homicidal, and self-harm ideation, plan and intent Content/Perceptual Disturbances: none Orientation: AAOx4 Cognition/Sensorium: intact Insight: good Judgment: good  Time Requirements: Feedback: 40 total minutes (96130)  DSM-5 Diagnosis(es) Code: E66.01 Morbid Obesity  Plan: No further follow-up by this writer is planend.

## 2021-06-25 ENCOUNTER — Telehealth: Payer: Self-pay | Admitting: Pharmacist

## 2021-06-25 NOTE — Chronic Care Management (AMB) (Signed)
Chronic Care Management Pharmacy Assistant   Name: ARMANDA FORAND  MRN: [id number] DOB: May 07, 1956  Reason for Encounter: Rosuvastatin Tolerance per Gaylord Shih   Conditions to be addressed/monitored: HLD  Recent office visits:  06/14/21 [name], [name], MD - Patient presented via [facility] for Chronic bilateral low back pain unspecified whether sciatica present and other concerns. Prescribed prednisone 40 mg and Tizanidine 4 mg. Stopped Semaglutide 2 mg  Recent consult visits:  06/18/21 Barker,Trevor T (Behavioral [facility]) - Patient presented for Morbid obesity, No other visit details available.   Hospital visits:  Medication Reconciliation was completed by comparing discharge summary, patients EMR and Pharmacy list, and upon discussion with patient.  Patient presented to [facility] on  04/23/21 due to Esophagogastroduodenoscopy. Patient was present for 3 hours.    New?Medications Started at [facility] Discharge:?? -started  None  Medication Changes at Hospital Discharge: -Changed  None  Medications Discontinued at Hospital Discharge: -Stopped None  Medications that remain the same after Hospital Discharge:??  -All other medications will remain the same.    Medications: Outpatient Encounter Medications as of 06/25/2021  Medication Sig   albuterol (VENTOLIN HFA) 108 (90 Base) MCG/ACT inhaler Inhale 1-2 puffs into the lungs every 6 (six) hours as needed for wheezing or shortness of breath.   budesonide-formoterol (SYMBICORT) 160-4.5 MCG/ACT inhaler Inhale 2 puffs into the lungs 2 (two) times daily.   cholecalciferol (VITAMIN D3) 25 MCG (1000 UNIT) tablet Take 1,000 Units by mouth daily.   diclofenac (VOLTAREN) 75 MG EC tablet Take 1 tablet (75 mg total) by mouth 2 (two) times daily as needed.   hydrochlorothiazide (HYDRODIURIL) 25 MG tablet Take 1 tablet (25 mg total) by mouth daily.   lisinopril (ZESTRIL) 40 MG tablet TAKE 1 TABLET BY MOUTH  DAILY    rosuvastatin (CRESTOR) 10 MG tablet Take 1 tablet (10 mg total) by mouth daily.   tiZANidine (ZANAFLEX) 4 MG tablet Take 1 tablet (4 mg total) by mouth every 12 (twelve) hours as needed for muscle spasms.   trolamine salicylate (ASPERCREME) 10 % cream Apply 1 application topically as needed for muscle pain.   No facility-administered encounter medications on file as of 06/25/2021.  06/25/2021 Name: LIZANIA BOUCHARD MRN: [id number] DOB: 1955/11/20 TAMIKA SHROPSHIRE is a 65 y.o. year old female who is a primary care patient of [name], Timoteo Expose, MD.  Comprehensive medication review performed; Spoke to patient regarding cholesterol  Lipid Panel    Component Value Date/Time   CHOL 188 10/27/2020 1546   TRIG 91 10/27/2020 1546   HDL 43 10/27/2020 1546   LDLCALC 128 (H) 10/27/2020 1546    10-year ASCVD risk score: The 10-year ASCVD risk score (Arnett DK, et al., 2019) is: 12.9%   Values used to calculate the score:     Age: 65 years     Sex: Female     Is Non-Hispanic African American: Yes     Diabetic: No     Tobacco smoker: No     Systolic Blood Pressure: 150 mmHg     Is BP treated: Yes     HDL Cholesterol: 43 mg/dL     Total Cholesterol: 188 mg/dL  Current antihyperlipidemic regimen:  Rosuvastatin 10 mg daily - taking 1/2 pill daily Previous antihyperlipidemic medications tried: None ASCVD risk enhancing conditions: HTN What recent interventions/DTPs have been made by any provider to improve Cholesterol control since last [facility] Visit: patient reports she has started back on the rosuvastatin and  taking one half pill daily. She denies any stomach upset or nausea since doing so. Any recent hospitalizations or ED visits since last visit with [facility]? No   Adherence Review: Does the patient have >5 day gap between last estimated fill dates? Yes Patient stopped taking still has supply verified last fill date with pharm      Care Gaps: Zoster Vaccines - Overdue PNA Vaccine- Overdue COVID  Booster - Overdue TDAP- Overdue FLU Vaccine - Overdue AWV- 02/2021 CCM- 5/23 BP- 150/88 (05/06/21) Lab Results  Component Value Date   HGBA1C 5.7 12/02/2019    Star Rating Drugs: Lisinopril (Zestril) 40 mg - Last filled 04/29/21 90 DS  Rosuvastatin (Crestor) 10 mg - Last filled 11/02/20 90 DS at [facility] (Verified as Accurate pt had stopped taking)    Pamala Duffel CMA Clinical Pharmacist Assistant [phone]

## 2021-07-01 ENCOUNTER — Other Ambulatory Visit (INDEPENDENT_AMBULATORY_CARE_PROVIDER_SITE_OTHER): Payer: Medicare Other

## 2021-07-01 DIAGNOSIS — I10 Essential (primary) hypertension: Secondary | ICD-10-CM

## 2021-07-01 LAB — BASIC METABOLIC PANEL
BUN: 13 mg/dL (ref 6–23)
CO2: 29 mEq/L (ref 19–32)
Calcium: 10 mg/dL (ref 8.4–10.5)
Chloride: 104 mEq/L (ref 96–112)
Creatinine, Ser: 0.74 mg/dL (ref 0.40–1.20)
GFR: 85.59 mL/min (ref >60.00)
Glucose, Bld: 108 mg/dL — ABNORMAL HIGH (ref 70–99)
Potassium: 3.8 mEq/L (ref 3.5–5.1)
Sodium: 138 mEq/L (ref 135–145)

## 2021-07-19 ENCOUNTER — Other Ambulatory Visit: Payer: Self-pay

## 2021-07-19 ENCOUNTER — Encounter: Payer: Medicare Other | Attending: Surgery | Admitting: Skilled Nursing Facility1

## 2021-07-19 DIAGNOSIS — Z713 Dietary counseling and surveillance: Secondary | ICD-10-CM | POA: Insufficient documentation

## 2021-07-19 DIAGNOSIS — Z6841 Body Mass Index (BMI) 40.0 and over, adult: Secondary | ICD-10-CM | POA: Insufficient documentation

## 2021-07-19 NOTE — Progress Notes (Signed)
Pre-Operative Nutrition Class:    Patient was seen on 07/19/2021 for Pre-Operative Bariatric Surgery Education at [facility]    Surgery date:  Surgery type: 352.6 Start weight at [facility]: 352.6 Weight today: 355.6  Samples given per MNT protocol. Patient educated on appropriate usage: Ensure max exp: September 08, 2021 Ensure max lot: [id number]  Chewable bariatric advantage: advanced multi EA exp: 08/23 Chewable bariatric advantage: advanced multi EA lot: [id number]  Bariatric advantage calcium citrate exp: 02/23 Bariatric advantage calcium citrate lot: [id number]   The following the learning objectives were met by the patient during this course: Identify Pre-Op Dietary Goals and will begin 2 weeks pre-operatively Identify appropriate sources of fluids and proteins  State protein recommendations and appropriate sources pre and post-operatively Identify Post-Operative Dietary Goals and will follow for 2 weeks post-operatively Identify appropriate multivitamin and calcium sources Describe the need for physical activity post-operatively and will follow MD recommendations State when to call healthcare provider regarding medication questions or post-operative complications When having a diagnosis of diabetes understanding hypoglycemia symptoms and the inclusion of 1 complex carbohydrate per meal  Handouts given during class include: Pre-Op Bariatric Surgery Diet Handout Protein Shake Handout Post-Op Bariatric Surgery Nutrition Handout BELT Program Information Flyer Support Group Information Flyer [facility] Bariatric Supplements Price List  Follow-Up Plan: Patient will follow-up at [facility] 2 weeks post operatively for diet advancement per MD.

## 2021-07-28 ENCOUNTER — Other Ambulatory Visit: Payer: Self-pay | Admitting: Orthopedic Surgery

## 2021-07-28 ENCOUNTER — Ambulatory Visit: Payer: Self-pay | Admitting: Surgery

## 2021-07-28 DIAGNOSIS — Z01818 Encounter for other preprocedural examination: Secondary | ICD-10-CM

## 2021-07-28 DIAGNOSIS — M25552 Pain in left hip: Secondary | ICD-10-CM

## 2021-07-28 NOTE — Progress Notes (Signed)
Sent message, via epic in basket, requesting orders in epic from surgeon.  

## 2021-07-29 DIAGNOSIS — I10 Essential (primary) hypertension: Secondary | ICD-10-CM | POA: Diagnosis not present

## 2021-07-29 DIAGNOSIS — G4733 Obstructive sleep apnea (adult) (pediatric): Secondary | ICD-10-CM | POA: Diagnosis not present

## 2021-08-02 ENCOUNTER — Ambulatory Visit
Admission: RE | Admit: 2021-08-02 | Discharge: 2021-08-02 | Disposition: A | Discharge status: Home / Self Care | Payer: Medicare Other | Source: Ambulatory Visit | Attending: Orthopedic Surgery | Admitting: Orthopedic Surgery

## 2021-08-02 DIAGNOSIS — M25552 Pain in left hip: Secondary | ICD-10-CM | POA: Diagnosis not present

## 2021-08-02 MED ORDER — METHYLPREDNISOLONE ACETATE 40 MG/ML INJ SUSP (RADIOLOG
80.0000 mg | Freq: Once | INTRAMUSCULAR | Status: AC
  Administered 2021-08-02: 80 mg via INTRA_ARTICULAR

## 2021-08-02 MED ORDER — IOPAMIDOL (ISOVUE-M 200) INJECTION 41%
1.0000 mL | Freq: Once | INTRAMUSCULAR | Status: AC
  Administered 2021-08-02: 1 mL via INTRA_ARTICULAR

## 2021-08-04 ENCOUNTER — Other Ambulatory Visit (HOSPITAL_COMMUNITY): Payer: Self-pay

## 2021-08-06 NOTE — Patient Instructions (Signed)
DUE TO COVID-19 ONLY ONE VISITOR IS ALLOWED TO COME WITH YOU AND STAY IN THE WAITING ROOM ONLY DURING PRE OP AND PROCEDURE.   **NO VISITORS ARE ALLOWED IN THE SHORT STAY AREA OR RECOVERY ROOM!!**  IF YOU WILL BE ADMITTED INTO THE HOSPITAL YOU ARE ALLOWED ONLY TWO SUPPORT PEOPLE DURING VISITATION HOURS ONLY (7 AM -8PM)   The support person(s) must pass our screening, gel in and out, and wear a mask at all times, including in the patients room. Patients must also wear a mask when staff or their support person are in the room. Visitors GUEST BADGE MUST BE WORN VISIBLY  One adult visitor may remain with you overnight and MUST be in the room by 8 P.M.  No visitors under the age of 60. Any visitor under the age of 38 must be accompanied by an adult.    COVID SWAB TESTING MUST BE COMPLETED ON: 08/12/21 @ 9:00 AM    Site: [facility] [address] [place] Enter: Main Entrance have a seat in the waiting area to the right of main entrance (DO NOT CHECK IN AT ADMITTING!!!!!) Dial: [phone] to alert staff you have arrived  You are not required to quarantine, however you are required to wear a well-fitted mask when you are out and around people not in your household.  Hand Hygiene often Do NOT share personal items Notify your provider if you are in close contact with someone who has COVID or you develop fever 100.4 or greater, new onset of sneezing, cough, sore throat, shortness of breath or body aches.  [facility] Entrance [address], [address], must go inside of the hospital, NOT A DRIVE THRU!  (Must self quarantine after testing. Follow instructions on handout.)       Your procedure is scheduled on: 08/16/21   Report to [facility] Main Entrance    Report to admitting at: 8:45 AM   Call this number if you have problems the morning of surgery [phone]   May have liquids until : 8:00 AM   day of surgery  CLEAR  LIQUID DIET  Foods Allowed                                                                     Foods Excluded  Water, Black Coffee and tea, regular and decaf                             liquids that you cannot  Plain Jell-O in any flavor  (No red)                                           see through such as: Fruit ices (not with fruit pulp)                                     milk, soups, orange juice  Iced Popsicles (No red)                                    All solid food                                   Apple juices Sports drinks like Gatorade (No red) Lightly seasoned clear broth or consume(fat free) Sugar  Sample Menu Breakfast                                Lunch                                     Supper Cranberry juice                    Beef broth                            Chicken broth Jell-O                                     Grape juice                           Apple juice Coffee or tea                        Jell-O                                      Popsicle                                                Coffee or tea                        Coffee or tea    MORNING OF SURGERY DRINK:   DRINK 1 G2 drink BEFORE YOU LEAVE HOME, DRINK ALL OF THE  G2 DRINK AT ONE TIME (8:00 AM).   NO SOLID FOOD AFTER 600 PM THE NIGHT BEFORE YOUR SURGERY. YOU MAY DRINK CLEAR FLUIDS. THE G2 DRINK YOU DRINK BEFORE YOU LEAVE HOME WILL BE THE LAST FLUIDS YOU DRINK BEFORE SURGERY.  PAIN IS EXPECTED AFTER SURGERY AND WILL NOT BE COMPLETELY ELIMINATED. AMBULATION AND TYLENOL WILL HELP REDUCE INCISIONAL AND GAS PAIN. MOVEMENT IS KEY!  YOU ARE EXPECTED TO BE OUT OF BED WITHIN 4 HOURS OF ADMISSION TO YOUR PATIENT ROOM.  SITTING IN THE RECLINER THROUGHOUT THE DAY IS IMPORTANT FOR DRINKING FLUIDS AND MOVING GAS THROUGHOUT THE GI TRACT.  COMPRESSION STOCKINGS SHOULD BE WORN [facility] STAY UNLESS YOU ARE WALKING.   INCENTIVE SPIROMETER SHOULD BE USED EVERY HOUR WHILE AWAKE TO  DECREASE POST-OPERATIVE COMPLICATIONS SUCH AS PNEUMONIA.  WHEN DISCHARGED HOME, IT IS IMPORTANT TO CONTINUE TO WALK EVERY HOUR AND  USE THE INCENTIVE SPIROMETER EVERY HOUR.    Complete one Gatorade drink the morning of surgery 3 hours prior to scheduled surgery at: 8:00 AM.    The day of surgery:  Drink ONE (1) Pre-Surgery Clear Ensure or G2 at AM the morning of surgery. Drink in one sitting. Do not sip.  This drink was given to you during your hospital  pre-op appointment visit. Nothing else to drink after completing the  Pre-Surgery Clear Ensure or G2.          If you have questions, please contact your surgeons office.  FOLLOW BOWEL PREP INSTRUCTIONS YOU RECEIVED FROM YOUR SURGEON'S OFFICE!!!     Oral Hygiene is also important to reduce your risk of infection.                                    Remember - BRUSH YOUR TEETH THE MORNING OF SURGERY WITH YOUR REGULAR TOOTHPASTE   Do NOT smoke after Midnight   Take these medicines the morning of surgery with A SIP OF WATER: N/A. Use inhalers as usual.  DO NOT TAKE ANY ORAL DIABETIC MEDICATIONS DAY OF YOUR SURGERY                              You may not have any metal on your body including hair pins, jewelry, and body piercing             Do not wear make-up, lotions, powders, perfumes/cologne, or deodorant  Do not wear nail polish including gel and S&S, artificial/acrylic nails, or any other type of covering on natural nails including finger and toenails. If you have artificial nails, gel coating, etc. that needs to be removed by a nail salon please have this removed prior to surgery or surgery may need to be canceled/ delayed if the surgeon/ anesthesia feels like they are unable to be safely monitored.   Do not shave  48 hours prior to surgery.    Do not bring valuables to the hospital. [place] IS NOT             RESPONSIBLE   FOR VALUABLES.   Contacts, dentures or bridgework may not be worn into surgery.   Bring small  overnight bag day of surgery.    Patients discharged on the day of surgery will not be allowed to drive home.  Someone needs to stay with you for the first 24 hours after anesthesia.   Special Instructions: Bring a copy of your healthcare power of attorney and living will documents         the day of surgery if you haven't scanned them before.              Please read over the following fact sheets you were given: IF YOU HAVE QUESTIONS ABOUT YOUR PRE-OP INSTRUCTIONS PLEASE CALL [phone]     [facility] - Preparing for Surgery Before surgery, you can play an important role.  Because skin is not sterile, your skin needs to be as free of germs as possible.  You can reduce the number of germs on your skin by washing with CHG (chlorahexidine gluconate) soap before surgery.  CHG is an antiseptic cleaner which kills germs and bonds with the skin to continue killing germs even after washing. Please DO NOT use if you have an allergy to CHG or  antibacterial soaps.  If your skin becomes reddened/irritated stop using the CHG and inform your nurse when you arrive at Short Stay. Do not shave (including legs and underarms) for at least 48 hours prior to the first CHG shower.  You may shave your face/neck. Please follow these instructions carefully:  1.  Shower with CHG Soap the night before surgery and the  morning of Surgery.  2.  If you choose to wash your hair, wash your hair first as usual with your  normal  shampoo.  3.  After you shampoo, rinse your hair and body thoroughly to remove the  shampoo.                           4.  Use CHG as you would any other liquid soap.  You can apply chg directly  to the skin and wash                       Gently with a scrungie or clean washcloth.  5.  Apply the CHG Soap to your body ONLY FROM THE NECK DOWN.   Do not use on face/ open                           Wound or open sores. Avoid contact with eyes, ears mouth and genitals (private parts).                        Wash face,  Genitals (private parts) with your normal soap.             6.  Wash thoroughly, paying special attention to the area where your surgery  will be performed.  7.  Thoroughly rinse your body with warm water from the neck down.  8.  DO NOT shower/wash with your normal soap after using and rinsing off  the CHG Soap.                9.  Pat yourself dry with a clean towel.            10.  Wear clean pajamas.            11.  Place clean sheets on your bed the night of your first shower and do not  sleep with pets. Day of Surgery : Do not apply any lotions/deodorants the morning of surgery.  Please wear clean clothes to the hospital/surgery center.  FAILURE TO FOLLOW THESE INSTRUCTIONS MAY RESULT IN THE CANCELLATION OF YOUR SURGERY PATIENT SIGNATURE_________________________________  NURSE SIGNATURE__________________________________  ________________________________________________________________________

## 2021-08-09 ENCOUNTER — Encounter (HOSPITAL_COMMUNITY)
Admission: RE | Admit: 2021-08-09 | Discharge: 2021-08-09 | Disposition: A | Discharge status: Home / Self Care | Payer: Medicare Other | Source: Ambulatory Visit | Attending: Surgery | Admitting: Surgery

## 2021-08-09 ENCOUNTER — Other Ambulatory Visit: Payer: Self-pay

## 2021-08-09 ENCOUNTER — Encounter (HOSPITAL_COMMUNITY): Payer: Self-pay

## 2021-08-09 VITALS — BP 141/94 | HR 65 | Temp 98.4°F | Ht 65.0 in | Wt 343.0 lb

## 2021-08-09 DIAGNOSIS — Z01818 Encounter for other preprocedural examination: Secondary | ICD-10-CM

## 2021-08-09 DIAGNOSIS — R7303 Prediabetes: Secondary | ICD-10-CM | POA: Insufficient documentation

## 2021-08-09 DIAGNOSIS — Z01812 Encounter for preprocedural laboratory examination: Secondary | ICD-10-CM | POA: Insufficient documentation

## 2021-08-09 HISTORY — DX: Prediabetes: R73.03

## 2021-08-09 LAB — GLUCOSE, CAPILLARY: Glucose-Capillary: 99 mg/dL (ref 70–99)

## 2021-08-09 LAB — CBC WITH DIFFERENTIAL/PLATELET
Abs Immature Granulocytes: 0.01 10*3/uL (ref 0.00–0.07)
Basophils Absolute: 0.1 10*3/uL (ref 0.0–0.1)
Basophils Relative: 1 %
Eosinophils Absolute: 0.2 10*3/uL (ref 0.0–0.5)
Eosinophils Relative: 4 %
HCT: 41.3 % (ref 36.0–46.0)
Hemoglobin: 12.8 g/dL (ref 12.0–15.0)
Immature Granulocytes: 0 %
Lymphocytes Relative: 34 %
Lymphs Abs: 2 10*3/uL (ref 0.7–4.0)
MCH: 28.3 pg (ref 26.0–34.0)
MCHC: 31 g/dL (ref 30.0–36.0)
MCV: 91.4 fL (ref 80.0–100.0)
Monocytes Absolute: 0.4 10*3/uL (ref 0.1–1.0)
Monocytes Relative: 7 %
Neutro Abs: 3.2 10*3/uL (ref 1.7–7.7)
Neutrophils Relative %: 54 %
Platelets: 327 10*3/uL (ref 150–400)
RBC: 4.52 MIL/uL (ref 3.87–5.11)
RDW: 14.5 % (ref 11.5–15.5)
WBC: 5.9 10*3/uL (ref 4.0–10.5)
nRBC: 0 % (ref 0.0–0.2)

## 2021-08-09 LAB — COMPREHENSIVE METABOLIC PANEL
ALT: 11 U/L (ref 0–44)
AST: 15 U/L (ref 15–41)
Albumin: 3.9 g/dL (ref 3.5–5.0)
Alkaline Phosphatase: 86 U/L (ref 38–126)
Anion gap: 6 (ref 5–15)
BUN: 20 mg/dL (ref 8–23)
CO2: 27 mmol/L (ref 22–32)
Calcium: 9.8 mg/dL (ref 8.9–10.3)
Chloride: 104 mmol/L (ref 98–111)
Creatinine, Ser: 0.7 mg/dL (ref 0.44–1.00)
GFR, Estimated: >60 mL/min (ref >60)
Glucose, Bld: 99 mg/dL (ref 70–99)
Potassium: 3.8 mmol/L (ref 3.5–5.1)
Sodium: 137 mmol/L (ref 135–145)
Total Bilirubin: 0.4 mg/dL (ref 0.3–1.2)
Total Protein: 7.6 g/dL (ref 6.5–8.1)

## 2021-08-09 LAB — HEMOGLOBIN A1C
Hgb A1c MFr Bld: 5.4 % (ref 4.8–5.6)
Mean Plasma Glucose: 108.28 mg/dL

## 2021-08-09 NOTE — Progress Notes (Addendum)
COVID Vaccine Completed: Yes Date COVID Vaccine completed: 06/07/20 x 2 COVID vaccine manufacturer:    Moderna   COVID Test: 08/12/21 @ 9:00 AM PCP - Dr. Betty [name] Cardiologist - Dr. Driscilla Moats  Chest x-ray -  EKG - 11/02/20: EPIC Stress Test -  ECHO - 10/25/17 Cardiac Cath -  Pacemaker/ICD device last checked:  Sleep Study - Yes CPAP - NO  Fasting Blood Sugar - N/A Checks Blood Sugar _____ times a day  Blood Thinner Instructions: Aspirin Instructions: Last Dose:  Anesthesia review: Hx: HTN,OSA(NO CPAP)  Patient denies shortness of breath, fever, cough and chest pain at PAT appointment   Patient verbalized understanding of instructions that were given to them at the PAT appointment. Patient was also instructed that they will need to review over the PAT instructions again at home before surgery.

## 2021-08-11 ENCOUNTER — Other Ambulatory Visit: Payer: Self-pay | Admitting: Family Medicine

## 2021-08-11 DIAGNOSIS — M5116 Intervertebral disc disorders with radiculopathy, lumbar region: Secondary | ICD-10-CM

## 2021-08-11 DIAGNOSIS — M159 Polyosteoarthritis, unspecified: Secondary | ICD-10-CM

## 2021-08-12 ENCOUNTER — Encounter (HOSPITAL_COMMUNITY)
Admission: RE | Admit: 2021-08-12 | Discharge: 2021-08-12 | Disposition: A | Discharge status: Home / Self Care | Payer: Medicare Other | Source: Ambulatory Visit | Attending: Surgery | Admitting: Surgery

## 2021-08-12 ENCOUNTER — Other Ambulatory Visit: Payer: Self-pay

## 2021-08-12 DIAGNOSIS — Z01818 Encounter for other preprocedural examination: Secondary | ICD-10-CM

## 2021-08-12 DIAGNOSIS — Z20822 Contact with and (suspected) exposure to covid-19: Secondary | ICD-10-CM | POA: Diagnosis not present

## 2021-08-12 DIAGNOSIS — Z01812 Encounter for preprocedural laboratory examination: Secondary | ICD-10-CM | POA: Insufficient documentation

## 2021-08-12 LAB — SARS CORONAVIRUS 2 (TAT 6-24 HRS): SARS Coronavirus 2: NEGATIVE

## 2021-08-13 ENCOUNTER — Other Ambulatory Visit: Payer: Self-pay

## 2021-08-13 MED ORDER — DICLOFENAC SODIUM 75 MG PO TBEC
75.0000 mg | DELAYED_RELEASE_TABLET | Freq: Two times a day (BID) | ORAL | 0 refills | Status: DC | PRN
  Filled 2021-08-13: qty 40, 20d supply, fill #0

## 2021-08-16 ENCOUNTER — Encounter (HOSPITAL_COMMUNITY)
Admission: RE | Disposition: A | Discharge status: Home / Self Care | Payer: Self-pay | Source: Home / Self Care | Attending: Surgery

## 2021-08-16 ENCOUNTER — Inpatient Hospital Stay (HOSPITAL_COMMUNITY): Payer: Medicare Other | Admitting: Certified Registered"

## 2021-08-16 ENCOUNTER — Inpatient Hospital Stay (HOSPITAL_COMMUNITY)
Admission: RE | Admit: 2021-08-16 | Discharge: 2021-08-20 | DRG: 621 | Disposition: A | Discharge status: Home / Self Care | Payer: Medicare Other | Attending: Surgery | Admitting: Surgery

## 2021-08-16 ENCOUNTER — Encounter (HOSPITAL_COMMUNITY): Payer: Self-pay | Admitting: Surgery

## 2021-08-16 ENCOUNTER — Other Ambulatory Visit: Payer: Self-pay

## 2021-08-16 DIAGNOSIS — Z91040 Latex allergy status: Secondary | ICD-10-CM | POA: Diagnosis not present

## 2021-08-16 DIAGNOSIS — G4730 Sleep apnea, unspecified: Secondary | ICD-10-CM | POA: Diagnosis not present

## 2021-08-16 DIAGNOSIS — Z818 Family history of other mental and behavioral disorders: Secondary | ICD-10-CM

## 2021-08-16 DIAGNOSIS — Z7951 Long term (current) use of inhaled steroids: Secondary | ICD-10-CM | POA: Diagnosis not present

## 2021-08-16 DIAGNOSIS — J45909 Unspecified asthma, uncomplicated: Secondary | ICD-10-CM | POA: Diagnosis not present

## 2021-08-16 DIAGNOSIS — Z8249 Family history of ischemic heart disease and other diseases of the circulatory system: Secondary | ICD-10-CM

## 2021-08-16 DIAGNOSIS — Z9109 Other allergy status, other than to drugs and biological substances: Secondary | ICD-10-CM | POA: Diagnosis not present

## 2021-08-16 DIAGNOSIS — Z87891 Personal history of nicotine dependence: Secondary | ICD-10-CM

## 2021-08-16 DIAGNOSIS — E785 Hyperlipidemia, unspecified: Secondary | ICD-10-CM | POA: Diagnosis present

## 2021-08-16 DIAGNOSIS — Z888 Allergy status to other drugs, medicaments and biological substances status: Secondary | ICD-10-CM

## 2021-08-16 DIAGNOSIS — R911 Solitary pulmonary nodule: Secondary | ICD-10-CM | POA: Diagnosis present

## 2021-08-16 DIAGNOSIS — Z886 Allergy status to analgesic agent status: Secondary | ICD-10-CM

## 2021-08-16 DIAGNOSIS — Z6841 Body Mass Index (BMI) 40.0 and over, adult: Secondary | ICD-10-CM | POA: Diagnosis not present

## 2021-08-16 DIAGNOSIS — R7303 Prediabetes: Secondary | ICD-10-CM | POA: Diagnosis not present

## 2021-08-16 DIAGNOSIS — Z833 Family history of diabetes mellitus: Secondary | ICD-10-CM | POA: Diagnosis not present

## 2021-08-16 DIAGNOSIS — Z96652 Presence of left artificial knee joint: Secondary | ICD-10-CM | POA: Diagnosis not present

## 2021-08-16 DIAGNOSIS — Z79899 Other long term (current) drug therapy: Secondary | ICD-10-CM | POA: Diagnosis not present

## 2021-08-16 DIAGNOSIS — Z83438 Family history of other disorder of lipoprotein metabolism and other lipidemia: Secondary | ICD-10-CM

## 2021-08-16 DIAGNOSIS — Z01818 Encounter for other preprocedural examination: Secondary | ICD-10-CM

## 2021-08-16 DIAGNOSIS — I10 Essential (primary) hypertension: Secondary | ICD-10-CM | POA: Diagnosis present

## 2021-08-16 DIAGNOSIS — G4733 Obstructive sleep apnea (adult) (pediatric): Secondary | ICD-10-CM | POA: Diagnosis not present

## 2021-08-16 DIAGNOSIS — E538 Deficiency of other specified B group vitamins: Secondary | ICD-10-CM | POA: Diagnosis not present

## 2021-08-16 HISTORY — PX: UPPER GI ENDOSCOPY: SHX6162

## 2021-08-16 LAB — CBC
HCT: 43.4 % (ref 36.0–46.0)
Hemoglobin: 13.6 g/dL (ref 12.0–15.0)
MCH: 28.2 pg (ref 26.0–34.0)
MCHC: 31.3 g/dL (ref 30.0–36.0)
MCV: 90 fL (ref 80.0–100.0)
Platelets: 330 K/uL (ref 150–400)
RBC: 4.82 MIL/uL (ref 3.87–5.11)
RDW: 14.4 % (ref 11.5–15.5)
WBC: 13.2 K/uL — ABNORMAL HIGH (ref 4.0–10.5)
nRBC: 0 % (ref 0.0–0.2)

## 2021-08-16 LAB — GLUCOSE, CAPILLARY: Glucose-Capillary: 91 mg/dL (ref 70–99)

## 2021-08-16 LAB — TYPE AND SCREEN
ABO/RH(D): O POS
Antibody Screen: NEGATIVE

## 2021-08-16 LAB — CREATININE, SERUM
Creatinine, Ser: 0.89 mg/dL (ref 0.44–1.00)
GFR, Estimated: >60 mL/min (ref >60)

## 2021-08-16 SURGERY — GASTRECTOMY, SLEEVE, ROBOT-ASSISTED
Anesthesia: General | Site: Abdomen

## 2021-08-16 MED ORDER — FENTANYL CITRATE (PF) 100 MCG/2ML IJ SOLN
INTRAMUSCULAR | Status: AC
  Filled 2021-08-16: qty 2

## 2021-08-16 MED ORDER — BUPIVACAINE-EPINEPHRINE (PF) 0.25% -1:200000 IJ SOLN
INTRAMUSCULAR | Status: AC
  Filled 2021-08-16: qty 30

## 2021-08-16 MED ORDER — CHLORHEXIDINE GLUCONATE CLOTH 2 % EX PADS: 6.0000 | MEDICATED_PAD | Freq: Once | CUTANEOUS | Status: DC

## 2021-08-16 MED ORDER — ONDANSETRON HCL 4 MG/2ML IJ SOLN
INTRAMUSCULAR | Status: DC | PRN
  Administered 2021-08-16: 4 mg via INTRAVENOUS

## 2021-08-16 MED ORDER — ENSURE MAX PROTEIN PO LIQD
2.0000 [oz_av] | ORAL | Status: DC
  Administered 2021-08-18 – 2021-08-19 (×13): 2 [oz_av] via ORAL

## 2021-08-16 MED ORDER — APREPITANT 40 MG PO CAPS
40.0000 mg | ORAL_CAPSULE | ORAL | Status: AC
  Administered 2021-08-16: 40 mg via ORAL
  Filled 2021-08-16: qty 1

## 2021-08-16 MED ORDER — LISINOPRIL 20 MG PO TABS
40.0000 mg | ORAL_TABLET | Freq: Every day | ORAL | Status: DC
Start: 2021-08-17 — End: 2021-08-20
  Administered 2021-08-17 – 2021-08-20 (×4): 40 mg via ORAL
  Filled 2021-08-16 (×4): qty 2

## 2021-08-16 MED ORDER — ROCURONIUM BROMIDE 10 MG/ML (PF) SYRINGE
PREFILLED_SYRINGE | INTRAVENOUS | Status: DC | PRN
  Administered 2021-08-16: 100 mg via INTRAVENOUS

## 2021-08-16 MED ORDER — SIMETHICONE 80 MG PO CHEW
80.0000 mg | CHEWABLE_TABLET | Freq: Four times a day (QID) | ORAL | Status: DC | PRN
  Administered 2021-08-16: 80 mg via ORAL
  Filled 2021-08-16 (×2): qty 1

## 2021-08-16 MED ORDER — ROSUVASTATIN CALCIUM 5 MG PO TABS
5.0000 mg | ORAL_TABLET | Freq: Every day | ORAL | Status: DC
Start: 2021-08-17 — End: 2021-08-20
  Administered 2021-08-17 – 2021-08-20 (×4): 5 mg via ORAL
  Filled 2021-08-16 (×4): qty 1

## 2021-08-16 MED ORDER — PANTOPRAZOLE SODIUM 40 MG IV SOLR
40.0000 mg | Freq: Every day | INTRAVENOUS | Status: DC
  Administered 2021-08-16 – 2021-08-19 (×4): 40 mg via INTRAVENOUS
  Filled 2021-08-16: qty 40
  Filled 2021-08-16 (×3): qty 10

## 2021-08-16 MED ORDER — OXYCODONE HCL 5 MG/5ML PO SOLN: 5.0000 mg | Freq: Once | ORAL | Status: DC | PRN

## 2021-08-16 MED ORDER — ACETAMINOPHEN 500 MG PO TABS
1000.0000 mg | ORAL_TABLET | ORAL | Status: AC
  Administered 2021-08-16: 1000 mg via ORAL
  Filled 2021-08-16: qty 2

## 2021-08-16 MED ORDER — LACTATED RINGERS IV SOLN: INTRAVENOUS | Status: DC

## 2021-08-16 MED ORDER — ENOXAPARIN SODIUM 40 MG/0.4ML IJ SOSY
40.0000 mg | PREFILLED_SYRINGE | INTRAMUSCULAR | Status: AC
  Administered 2021-08-16: 40 mg via SUBCUTANEOUS
  Filled 2021-08-16: qty 0.4

## 2021-08-16 MED ORDER — SUGAMMADEX SODIUM 500 MG/5ML IV SOLN
INTRAVENOUS | Status: AC
  Filled 2021-08-16: qty 5

## 2021-08-16 MED ORDER — EPHEDRINE SULFATE-NACL 50-0.9 MG/10ML-% IV SOSY
PREFILLED_SYRINGE | INTRAVENOUS | Status: DC | PRN
  Administered 2021-08-16 (×2): 10 mg via INTRAVENOUS

## 2021-08-16 MED ORDER — DEXAMETHASONE SODIUM PHOSPHATE 10 MG/ML IJ SOLN
INTRAMUSCULAR | Status: AC
  Filled 2021-08-16: qty 1

## 2021-08-16 MED ORDER — ONDANSETRON HCL 4 MG/2ML IJ SOLN
4.0000 mg | INTRAMUSCULAR | Status: DC | PRN
  Administered 2021-08-16 – 2021-08-18 (×4): 4 mg via INTRAVENOUS
  Filled 2021-08-16 (×4): qty 2

## 2021-08-16 MED ORDER — CHLORHEXIDINE GLUCONATE 0.12 % MT SOLN
15.0000 mL | Freq: Once | OROMUCOSAL | Status: AC
  Administered 2021-08-16: 15 mL via OROMUCOSAL

## 2021-08-16 MED ORDER — OXYCODONE HCL 5 MG/5ML PO SOLN
5.0000 mg | Freq: Four times a day (QID) | ORAL | Status: DC | PRN
  Administered 2021-08-18: 5 mg via ORAL
  Filled 2021-08-16: qty 5

## 2021-08-16 MED ORDER — ONDANSETRON HCL 4 MG/2ML IJ SOLN
INTRAMUSCULAR | Status: AC
  Filled 2021-08-16: qty 2

## 2021-08-16 MED ORDER — PROMETHAZINE HCL 25 MG/ML IJ SOLN: 6.2500 mg | INTRAMUSCULAR | Status: DC | PRN

## 2021-08-16 MED ORDER — ACETAMINOPHEN 500 MG PO TABS: 1000.0000 mg | ORAL_TABLET | Freq: Three times a day (TID) | ORAL | 0 refills | Status: AC

## 2021-08-16 MED ORDER — SUGAMMADEX SODIUM 200 MG/2ML IV SOLN
INTRAVENOUS | Status: DC | PRN
  Administered 2021-08-16: 320 mg via INTRAVENOUS

## 2021-08-16 MED ORDER — TIZANIDINE HCL 4 MG PO TABS
4.0000 mg | ORAL_TABLET | Freq: Two times a day (BID) | ORAL | Status: DC | PRN
  Administered 2021-08-18: 4 mg via ORAL
  Filled 2021-08-16: qty 1

## 2021-08-16 MED ORDER — MUSCLE RUB 10-15 % EX CREA
TOPICAL_CREAM | CUTANEOUS | Status: DC | PRN
  Filled 2021-08-16: qty 85

## 2021-08-16 MED ORDER — PHENYLEPHRINE HCL-NACL 20-0.9 MG/250ML-% IV SOLN
INTRAVENOUS | Status: DC | PRN
Start: 2021-08-16 — End: 2021-08-16
  Administered 2021-08-16: 20 ug/min via INTRAVENOUS

## 2021-08-16 MED ORDER — BUPIVACAINE LIPOSOME 1.3 % IJ SUSP
INTRAMUSCULAR | Status: DC | PRN
  Administered 2021-08-16: 20 mL

## 2021-08-16 MED ORDER — ORAL CARE MOUTH RINSE: 15.0000 mL | Freq: Once | OROMUCOSAL | Status: AC

## 2021-08-16 MED ORDER — STERILE WATER FOR IRRIGATION IR SOLN
Status: DC | PRN
  Administered 2021-08-16: 1000 mL

## 2021-08-16 MED ORDER — BUPIVACAINE-EPINEPHRINE 0.25% -1:200000 IJ SOLN
INTRAMUSCULAR | Status: DC | PRN
  Administered 2021-08-16: 30 mL

## 2021-08-16 MED ORDER — DEXAMETHASONE SODIUM PHOSPHATE 10 MG/ML IJ SOLN
INTRAMUSCULAR | Status: DC | PRN
  Administered 2021-08-16: 4 mg via INTRAVENOUS

## 2021-08-16 MED ORDER — HYDROMORPHONE HCL 1 MG/ML IJ SOLN
0.2500 mg | INTRAMUSCULAR | Status: DC | PRN
  Administered 2021-08-16 (×2): 0.5 mg via INTRAVENOUS
  Administered 2021-08-16 (×2): 0.25 mg via INTRAVENOUS

## 2021-08-16 MED ORDER — ENOXAPARIN SODIUM 30 MG/0.3ML IJ SOSY
30.0000 mg | PREFILLED_SYRINGE | Freq: Two times a day (BID) | INTRAMUSCULAR | Status: DC
  Administered 2021-08-16 – 2021-08-20 (×8): 30 mg via SUBCUTANEOUS
  Filled 2021-08-16 (×8): qty 0.3

## 2021-08-16 MED ORDER — FENTANYL CITRATE (PF) 250 MCG/5ML IJ SOLN
INTRAMUSCULAR | Status: DC | PRN
  Administered 2021-08-16: 50 ug via INTRAVENOUS
  Administered 2021-08-16: 100 ug via INTRAVENOUS

## 2021-08-16 MED ORDER — OXYCODONE HCL 5 MG PO TABS: 5.0000 mg | ORAL_TABLET | Freq: Four times a day (QID) | ORAL | 0 refills | Status: DC | PRN

## 2021-08-16 MED ORDER — ACETAMINOPHEN 500 MG PO TABS
1000.0000 mg | ORAL_TABLET | Freq: Three times a day (TID) | ORAL | Status: DC
  Administered 2021-08-17 – 2021-08-19 (×8): 1000 mg via ORAL
  Filled 2021-08-16 (×9): qty 2

## 2021-08-16 MED ORDER — MEPERIDINE HCL 50 MG/ML IJ SOLN: 6.2500 mg | INTRAMUSCULAR | Status: DC | PRN

## 2021-08-16 MED ORDER — MIDAZOLAM HCL 2 MG/2ML IJ SOLN
INTRAMUSCULAR | Status: DC | PRN
  Administered 2021-08-16: 2 mg via INTRAVENOUS

## 2021-08-16 MED ORDER — 0.9 % SODIUM CHLORIDE (POUR BTL) OPTIME
TOPICAL | Status: DC | PRN
  Administered 2021-08-16: 1000 mL

## 2021-08-16 MED ORDER — ALBUTEROL SULFATE (2.5 MG/3ML) 0.083% IN NEBU: 2.5000 mg | INHALATION_SOLUTION | Freq: Four times a day (QID) | RESPIRATORY_TRACT | Status: DC | PRN

## 2021-08-16 MED ORDER — LIDOCAINE 2% (20 MG/ML) 5 ML SYRINGE
INTRAMUSCULAR | Status: DC | PRN
  Administered 2021-08-16: 40 mg via INTRAVENOUS

## 2021-08-16 MED ORDER — AMISULPRIDE (ANTIEMETIC) 5 MG/2ML IV SOLN
10.0000 mg | Freq: Once | INTRAVENOUS | Status: AC | PRN
  Administered 2021-08-16: 10 mg via INTRAVENOUS

## 2021-08-16 MED ORDER — TROLAMINE SALICYLATE 10 % EX CREA: 1.0000 "application " | TOPICAL_CREAM | CUTANEOUS | Status: DC | PRN

## 2021-08-16 MED ORDER — ALBUTEROL SULFATE HFA 108 (90 BASE) MCG/ACT IN AERS: 1.0000 | INHALATION_SPRAY | Freq: Four times a day (QID) | RESPIRATORY_TRACT | Status: DC | PRN

## 2021-08-16 MED ORDER — AMISULPRIDE (ANTIEMETIC) 5 MG/2ML IV SOLN
INTRAVENOUS | Status: AC
  Filled 2021-08-16: qty 4

## 2021-08-16 MED ORDER — BUPIVACAINE LIPOSOME 1.3 % IJ SUSP
INTRAMUSCULAR | Status: AC
  Filled 2021-08-16: qty 20

## 2021-08-16 MED ORDER — MIDAZOLAM HCL 2 MG/2ML IJ SOLN
INTRAMUSCULAR | Status: AC
  Filled 2021-08-16: qty 2

## 2021-08-16 MED ORDER — SODIUM CHLORIDE 0.9 % IV SOLN
2.0000 g | INTRAVENOUS | Status: AC
  Administered 2021-08-16: 2 g via INTRAVENOUS
  Filled 2021-08-16: qty 2

## 2021-08-16 MED ORDER — HYDROMORPHONE HCL 1 MG/ML IJ SOLN
INTRAMUSCULAR | Status: AC
  Administered 2021-08-16: 0.25 mg via INTRAVENOUS
  Filled 2021-08-16: qty 1

## 2021-08-16 MED ORDER — ROCURONIUM BROMIDE 10 MG/ML (PF) SYRINGE
PREFILLED_SYRINGE | INTRAVENOUS | Status: AC
  Filled 2021-08-16: qty 10

## 2021-08-16 MED ORDER — PROPOFOL 10 MG/ML IV BOLUS
INTRAVENOUS | Status: AC
  Filled 2021-08-16: qty 20

## 2021-08-16 MED ORDER — LACTATED RINGERS IR SOLN
Status: DC | PRN
  Administered 2021-08-16: 1000 mL

## 2021-08-16 MED ORDER — PANTOPRAZOLE SODIUM 40 MG PO TBEC: 40.0000 mg | DELAYED_RELEASE_TABLET | Freq: Every day | ORAL | 0 refills | Status: DC

## 2021-08-16 MED ORDER — ACETAMINOPHEN 160 MG/5ML PO SOLN
1000.0000 mg | Freq: Three times a day (TID) | ORAL | Status: DC
  Filled 2021-08-16: qty 40.6

## 2021-08-16 MED ORDER — BUPIVACAINE LIPOSOME 1.3 % IJ SUSP: 20.0000 mL | Freq: Once | INTRAMUSCULAR | Status: DC

## 2021-08-16 MED ORDER — GABAPENTIN 100 MG PO CAPS: 200.0000 mg | ORAL_CAPSULE | Freq: Two times a day (BID) | ORAL | 0 refills | Status: DC

## 2021-08-16 MED ORDER — MORPHINE SULFATE (PF) 2 MG/ML IV SOLN
1.0000 mg | INTRAVENOUS | Status: DC | PRN
  Administered 2021-08-16: 1 mg via INTRAVENOUS
  Filled 2021-08-16: qty 1

## 2021-08-16 MED ORDER — OXYCODONE HCL 5 MG PO TABS: 5.0000 mg | ORAL_TABLET | Freq: Once | ORAL | Status: DC | PRN

## 2021-08-16 MED ORDER — PROPOFOL 10 MG/ML IV BOLUS
INTRAVENOUS | Status: DC | PRN
Start: 2021-08-16 — End: 2021-08-16
  Administered 2021-08-16: 160 mg via INTRAVENOUS

## 2021-08-16 MED ORDER — ONDANSETRON 4 MG PO TBDP: 4.0000 mg | ORAL_TABLET | Freq: Four times a day (QID) | ORAL | 0 refills | Status: DC | PRN

## 2021-08-16 SURGICAL SUPPLY — 77 items
ADH SKN CLS APL DERMABOND .7 (GAUZE/BANDAGES/DRESSINGS) ×2
APL PRP STRL LF DISP 70% ISPRP (MISCELLANEOUS) ×4
APPLIER CLIP 5 13 M/L LIGAMAX5 (MISCELLANEOUS)
APPLIER CLIP ROT 10 11.4 M/L (STAPLE)
APR CLP MED LRG 11.4X10 (STAPLE)
APR CLP MED LRG 5 ANG JAW (MISCELLANEOUS)
BAG COUNTER SPONGE SURGICOUNT (BAG) ×4 IMPLANT
BAG SPNG CNTER NS LX DISP (BAG) ×2
BLADE SURG SZ11 CARB STEEL (BLADE) ×4 IMPLANT
CANNULA REDUC XI 12-8 STAPL (CANNULA) ×3
CANNULA REDUCER 12-8 DVNC XI (CANNULA) ×3 IMPLANT
CHLORAPREP W/TINT 26 (MISCELLANEOUS) ×8 IMPLANT
CLIP APPLIE 5 13 M/L LIGAMAX5 (MISCELLANEOUS) IMPLANT
CLIP APPLIE ROT 10 11.4 M/L (STAPLE) IMPLANT
COVER SURGICAL LIGHT HANDLE (MISCELLANEOUS) ×4 IMPLANT
COVER TIP SHEARS 8 DVNC (MISCELLANEOUS) IMPLANT
COVER TIP SHEARS 8MM DA VINCI (MISCELLANEOUS)
DERMABOND ADVANCED (GAUZE/BANDAGES/DRESSINGS) ×1
DERMABOND ADVANCED .7 DNX12 (GAUZE/BANDAGES/DRESSINGS) ×3 IMPLANT
DRAPE ARM DVNC X/XI (DISPOSABLE) ×12 IMPLANT
DRAPE COLUMN DVNC XI (DISPOSABLE) ×3 IMPLANT
DRAPE DA VINCI XI ARM (DISPOSABLE) ×12
DRAPE DA VINCI XI COLUMN (DISPOSABLE) ×3
ELECT REM PT RETURN 15FT ADLT (MISCELLANEOUS) ×4 IMPLANT
GAUZE 4X4 16PLY ~~LOC~~+RFID DBL (SPONGE) ×4 IMPLANT
GLOVE SURG ENC MOIS LTX SZ7.5 (GLOVE) ×8 IMPLANT
GLOVE SURG UNDER LTX SZ8 (GLOVE) ×8 IMPLANT
GOWN STRL REUS W/TWL XL LVL3 (GOWN DISPOSABLE) ×8 IMPLANT
GRASPER SUT TROCAR 14GX15 (MISCELLANEOUS) ×4 IMPLANT
HEMOSTAT SNOW SURGICEL 2X4 (HEMOSTASIS) IMPLANT
IRRIG SUCT STRYKERFLOW 2 WTIP (MISCELLANEOUS) ×3
IRRIGATION SUCT STRKRFLW 2 WTP (MISCELLANEOUS) ×3 IMPLANT
KIT BASIN OR (CUSTOM PROCEDURE TRAY) ×4 IMPLANT
KIT TURNOVER KIT A (KITS) IMPLANT
LUBRICANT JELLY K Y 4OZ (MISCELLANEOUS) IMPLANT
MARKER SKIN DUAL TIP RULER LAB (MISCELLANEOUS) IMPLANT
MAT PREVALON FULL STRYKER (MISCELLANEOUS) ×4 IMPLANT
NDL SPNL 18GX3.5 QUINCKE PK (NEEDLE) ×3 IMPLANT
NEEDLE SPNL 18GX3.5 QUINCKE PK (NEEDLE) ×3 IMPLANT
OBTURATOR OPTICAL STANDARD 8MM (TROCAR) ×3
OBTURATOR OPTICAL STND 8 DVNC (TROCAR) ×2
OBTURATOR OPTICALSTD 8 DVNC (TROCAR) ×3 IMPLANT
PACK CARDIOVASCULAR III (CUSTOM PROCEDURE TRAY) ×4 IMPLANT
RELOAD STAPLE 60 2.5 WHT DVNC (STAPLE) IMPLANT
RELOAD STAPLE 60 3.5 BLU DVNC (STAPLE) IMPLANT
RELOAD STAPLER 2.5X60 WHT DVNC (STAPLE) ×12 IMPLANT
RELOAD STAPLER 3.5X60 BLU DVNC (STAPLE) ×2 IMPLANT
SCISSORS LAP 5X35 DISP (ENDOMECHANICALS) IMPLANT
SEAL CANN UNIV 5-8 DVNC XI (MISCELLANEOUS) ×9 IMPLANT
SEAL XI 5MM-8MM UNIVERSAL (MISCELLANEOUS) ×9
SEALER SYNCHRO 8 IS4000 DV (MISCELLANEOUS) ×3
SEALER SYNCHRO 8 IS4000 DVNC (MISCELLANEOUS) ×3 IMPLANT
SLEEVE GASTRECTOMY 40FR VISIGI (MISCELLANEOUS) IMPLANT
SOL ANTI FOG 6CC (MISCELLANEOUS) ×3 IMPLANT
SOLUTION ANTI FOG 6CC (MISCELLANEOUS) ×1
SOLUTION ELECTROLUBE (MISCELLANEOUS) ×4 IMPLANT
SPIKE FLUID TRANSFER (MISCELLANEOUS) ×4 IMPLANT
STAPLER 60 DA VINCI SURE FORM (STAPLE) ×3
STAPLER 60 SUREFORM DVNC (STAPLE) ×3 IMPLANT
STAPLER CANNULA SEAL DVNC XI (STAPLE) ×3 IMPLANT
STAPLER CANNULA SEAL XI (STAPLE) ×3
STAPLER RELOAD 2.5X60 WHITE (STAPLE) ×18
STAPLER RELOAD 2.5X60 WHT DVNC (STAPLE) ×12
STAPLER RELOAD 3.5X60 BLU DVNC (STAPLE) ×2
STAPLER RELOAD 3.5X60 BLUE (STAPLE) ×3
SUT MNCRL AB 4-0 PS2 18 (SUTURE) ×8 IMPLANT
SUT VIC AB 0 CT1 27 (SUTURE)
SUT VIC AB 0 CT1 27XBRD ANTBC (SUTURE) ×3 IMPLANT
SUT VIC AB 2-0 SH 27 (SUTURE) ×3
SUT VIC AB 2-0 SH 27XBRD (SUTURE) IMPLANT
SYR 20ML LL LF (SYRINGE) ×4 IMPLANT
TOWEL OR 17X26 10 PK STRL BLUE (TOWEL DISPOSABLE) ×4 IMPLANT
TRAY FOLEY MTR SLVR 16FR STAT (SET/KITS/TRAYS/PACK) IMPLANT
TROCAR ADV FIXATION 12X100MM (TROCAR) IMPLANT
TROCAR Z-THREAD FIOS 5X100MM (TROCAR) ×4 IMPLANT
TUBE CALIBRATION LAPBAND (TUBING) IMPLANT
TUBING INSUFFLATION 10FT LAP (TUBING) ×4 IMPLANT

## 2021-08-16 NOTE — Progress Notes (Signed)
Unable to start water due to being so nauseous.

## 2021-08-16 NOTE — Op Note (Signed)
Haney: [name] (September 02, 1955, [id number])  Date of Surgery: 08/16/2021   Preoperative Diagnosis: Morbid obesity   Postoperative Diagnosis: Morbid obesity   Surgical Procedure: ROBOTIC SLEEVE GASTRECTOMY: [id number] (CPT) UPPER GI ENDOSCOPY: [id number]   Operative Team Members:  Surgeon(s) and Role:    * [name], Holly Major, MD - Primary    * Holly Hausen, MD - Assisting   Anesthesiologist: Holly Rainwater, MD CRNA: Holly Burow, CRNA   Anesthesia: General   Fluids:  No intake/output data recorded.  Complications: None  Drains:  none   Specimen:  ID Type Source Tests Collected by Time Destination  1 : greater curviture of stomach Tissue PATH GI benign resection SURGICAL PATHOLOGY [name], Holly Major, MD 08/16/2021 1247      Disposition:  PACU - hemodynamically stable.  Plan of Care: Admit to inpatient     Indications for Procedure: [name] is a 66 year old female presenting for bariatric surgery consultation. The Haney attended the information session and is interested in pursuing bariatric surgery. I entered the Haney's information into the [facility] bariatric risk-benefit calculator. I used this as a outlined to facilitate our discussion of the risks, and benefits of bariatric surgery, specifically comparing laparoscopic sleeve gastrectomy versus laparoscopic Roux-en-Y gastric bypass. The Haney is interested in pursuing a sleeve gastrectomy. She has the following comorbidities related to obesity: hypertension, hyperlipidemia, pre-diabetes.   Her preoperative pathway included: - Bloodwork - Nutrition consult - completed with [name] - Chest x-ray 08/06/20 No active cardiopulmonary disease. - EKG 08/06/20 NSR and T wave abnormality - Cardiology consultation with [name] 11/02/20 [name] completed, low risk - Psychology evaluation - Upper endoscopy with biopsy - Hill Grade 3 GE junction   He presents today for sleeve gastrectomy.  The  procedure itself as well as its risks, benefits and alternatives were discussed and the Haney granted consent to proceed.  We will proceed as scheduled.   Findings: eventration of previous umbilical hernia repair with some adhesions to omentum which were left in place.  Adhesions between the liver and diaphragm.  Infection status: Haney: [name] Elective Case Case: Elective Infection Present At Time Of Surgery (PATOS): None   Description of Procedure:   On the date stated above, the Haney was taken to the operating room suite and placed in supine positioning.  General endotracheal anesthesia was induced.  A timeout was completed verifying the correct Haney, procedure, positioning and equipment needed for the case.  The Haney's abdomen was prepped and draped in the usual sterile fashion.  I entered the Haney's right upper quadrant using a 5 mm trocar in the optical technique.  There was no trauma to underlying viscera with initial trocar placement.  The abdomen was insufflated 15 mmHg.  A total of 4 robotic trochars were placed across the mid abdomen, including the 5 mm initial trocar being upsized to a 8 mm trocar.  The robotic stapler trocar was placed in the number two position.  An 83mm trocar was placed in the left lower quadrant for the assistant.  The [facility] liver retractor was placed through the subxiphoid region and under the left lobe of the liver and was connected to the rail of the bed.  A TAP block was placed using marcaine and Exparel under direct vision of the laparoscope.  The [place] was docked and we transitioned to robotic surgery.   Using the tip up fenestrated grasper, fenestrated bipolar, 30 degree camera and Synchroseal from the  Haney's right to left, we began by dissecting the angle of His off the left crus of the diaphragm.  The adhesions between the stomach, spleen and diaphragm were divided using the Synchroseal to define the angle  of His.  I then started 6 cm away from the pylorus along the greater curve the stomach and divided the gastroepiploic vessels and the gastrocolic ligament.  The lesser sac was entered.  There were really no adhesions to the posterior wall of the stomach.  The greater curve was mobilized working superiorly toward the spleen.  All of the gastroepiploic and short gastric vessels were divided as we divided the gastrocolic and gastrosplenic ligaments.  As we reached the splenic hilum, I lifted the stomach anteriorly.  I created a tunnel between the stomach and its attachments to the retroperitoneum posteriorly just to the left of the GE junction until I encountered the left crus and my previous angle of His dissection.  We then were able to approach the shortest of the short gastrics both from the greater curve the stomach laterally and from the left crus medially.  These were divided using the Synchroseal and the fundus of the stomach was fully mobilized.  With the stomach fully mobilized we direct our attention to stapling.  A 40 French VISI G was inserted into the stomach and positioned along the lesser curve the stomach and suction was applied.  The 60 mm robotic sureform linear stapler was used to create the sleeve gastrectomy.  We started 6 cm from the pylorus and were careful to avoid narrowing at the incisura.  We stayed about 1 cm away from the GE junction to protect the sling fibers.  We used 1 blue load, and multiple white loads of the linear stapler.  With the sleeve gastrectomy completed the VISI G was taken off suction and removed and we performed an upper endoscopy.  The foregut was submerged in saline irrigation and the adult upper endoscope was inserted into the stomach as far as the pylorus to inspect the sleeve.  The sleeve appeared appropriately oriented without any twisting.  There was good hemostasis.  The sleeve was widely patent at the incisura with no narrowing.  There was no significant retained  fundus.  The sleeve was inflated with the endoscope and there was no bubbling of the irrigation, suggesting a negative leak test and a airtight sleeve gastrectomy.  The foregut was decompressed with the endoscope and the endoscope was removed.  2-0 vicryl suture was placed at the superior aspect of the staple line to pexy it to the diaphragm.  A 2-0 vicryl suture was used to run the distal staple line back to the divided omentum.  There was good hemostasis at the end of the case.  The robot was undocked and moved away from the field.  The sleeve gastrectomy specimen was removed from the stapler port.  The fascia of the stapler port was closed using a 0 Vicryl on a PMI suture passer.   The liver retractor was removed under direct vision.  The pneumoperitoneum was evacuated.  The skin was closed using 4-0 Monocryl and Dermabond.  All sponge and needle counts were correct at the end of the case.    [name], MD General, Bariatric, & Minimally Invasive Surgery [facility], [place]

## 2021-08-16 NOTE — Anesthesia Postprocedure Evaluation (Signed)
Anesthesia Post Note  Patient: [name]  Procedure(s) Performed: ROBOTIC SLEEVE GASTRECTOMY (Abdomen) UPPER GI ENDOSCOPY     Patient location during evaluation: PACU Anesthesia Type: General Level of consciousness: awake and alert Pain management: pain level controlled Vital Signs Assessment: post-procedure vital signs reviewed and stable Respiratory status: spontaneous breathing, nonlabored ventilation and respiratory function stable Cardiovascular status: blood pressure returned to baseline and stable Postop Assessment: no apparent nausea or vomiting Anesthetic complications: no   No notable events documented.  Last Vitals:  Vitals:   08/16/21 1400 08/16/21 1415  BP: 135/70 127/71  Pulse: 88 91  Resp: 19 (!) 24  Temp:    SpO2: 94% 96%    Last Pain:  Vitals:   08/16/21 1425  TempSrc:   PainSc: [place]

## 2021-08-16 NOTE — H&P (Addendum)
Admitting Physician: [name]  Service: Bariatric Surgery  CC: Obesity  Subjective   HPI: [name] is an 66 y.o. female who is here for robotic sleeve gastrectomy.  Past Medical History:  Diagnosis Date   Abnormal EKG 06/2003   History of inverted T waves V1-V3. Normal 2D echo (07/23/2003): LVEF 65%.   Anemia    BL Hgb 11-12. Ferritin 14 - low normal (08/2007). Colonoscopy 2009 - external hemorrhoids (excellent prep). Last anemia panel (12/2010) - Iron  24, TIBC 269,  B12  316, Folate 11.9, Ferritin 73.   Asthma    B12 deficiency    Back pain    Chest pain    Degenerative joint disease    BL knees (L>R), lumbar spine. Followed by Sports Medicine, Dr. Nori Haney.   Dyspnea    History of multiple pulmonary nodules    Incidental finding: CT Abd/ Pelvis (04/2010) - Several small lower lobe lung nodules, including one pure ground-glass pulmonary nodule measuring 8 mm in the left lower lobe.  Recommend follow-up chest CT (IV contrast preferred) in 6 months to document stability. //  CT Abd/ Pelvis (07/2010) -  3 mm RLL and  8 mm LLL nodule stable.  Other nodules unchanged, likely benign.   Hyperlipidemia    Hypertension    Incarcerated ventral hernia 04/2010   Noted on CT Abd/ pelvis (04/2010). Patient now s/p ventral hernia repair by [name] (12/2010)   Insomnia    Knee osteoarthritis    s/p Left total knee replacement (06/2011)   Left hip pain    Left ventricular diastolic dysfunction    Leg edema    Lower extremity edema    Chronic. 2D echo (2005) - EF 65%.   Obesity    OSA (obstructive sleep apnea)    Palpitations    Positive D dimer    Pre-diabetes    Right knee pain     Past Surgical History:  Procedure Laterality Date   BIOPSY  04/23/2021   Procedure: BIOPSY;  Surgeon: Felicie Morn, MD;  Location: [facility];  Service: General;;   COLONOSCOPY  2009   COLONOSCOPY WITH PROPOFOL N/A 12/18/2017   Procedure: COLONOSCOPY WITH PROPOFOL;   Surgeon: Holly Mayer, MD;  Location: [facility];  Service: Endoscopy;  Laterality: N/A;   ESOPHAGOGASTRODUODENOSCOPY N/A 04/23/2021   Procedure: ESOPHAGOGASTRODUODENOSCOPY (EGD);  Surgeon: Felicie Morn, MD;  Location: [facility];  Service: General;  Laterality: N/A;   FOOT SURGERY  2004    left   INCISION AND DRAINAGE ABSCESS ANAL  07/2008   I&D and debridgement of anorectal abscess   INCISIONAL HERNIA REPAIR  12/2010   Repair of incarcerated ventral incisional hernia with Ethicon mesh patch - performed by [name].    INGUINAL HERNIA REPAIR     JOINT REPLACEMENT Left 2013   left knee   TOTAL KNEE ARTHROPLASTY  06/16/2011   Left TOTAL KNEE ARTHROPLASTY;  Surgeon: [name];  Location: [facility];  Service: Orthopedics;  Laterality: Left;  left total knee arthroplasty   TUBAL LIGATION      Family History  Problem Relation Age of Onset   Diabetes Mother    Hypertension Mother    Stroke Mother    Hyperlipidemia Mother    Heart disease Mother    Sudden death Mother    Kidney disease Mother    Depression Mother    Dementia Father    Heart disease Father    Hyperlipidemia Father  Sudden death Father    Depression Father    Hypertension Sister    Diabetes Brother    Hypertension Brother    Rashes / Skin problems Maternal Grandfather    Heart attack Neg Hx     Social:  reports that she quit smoking about 22 years ago. Her smoking use included cigarettes. She has a 10.00 pack-year smoking history. She has never used smokeless tobacco. She reports that she does not drink alcohol and does not use drugs.  Allergies:  Allergies  Allergen Reactions   Ketorolac Tromethamine Shortness Of Breath and Palpitations   Atrovent [Ipratropium] Hives and Rash   Latex Rash and Other (See Comments)    NO POWDERED GLOVES!!!!   Tape Dermatitis    Irritates skin     Medications: Current Outpatient Medications  Medication Instructions   albuterol (VENTOLIN HFA) 108 (90  Base) MCG/ACT inhaler 1-2 puffs, Inhalation, Every 6 hours PRN   budesonide-formoterol (SYMBICORT) 160-4.5 MCG/ACT inhaler 2 puffs, Inhalation, 2 times daily   cholecalciferol (VITAMIN D3) 1,000 Units, Oral, Daily   diclofenac (VOLTAREN) 75 mg, Oral, 2 times daily PRN   hydrochlorothiazide (HYDRODIURIL) 25 mg, Oral, Daily   HYDROcodone-acetaminophen (NORCO/VICODIN) 5-325 MG tablet 1 tablet, Oral, Every 6 hours PRN   lisinopril (ZESTRIL) 40 MG tablet TAKE 1 TABLET BY MOUTH  DAILY   rosuvastatin (CRESTOR) 10 mg, Oral, Daily   tiZANidine (ZANAFLEX) 4 mg, Oral, Every 12 hours PRN   trolamine salicylate (ASPERCREME) 10 % cream 1 application, Topical, As needed    ROS - all of the below systems have been reviewed with the patient and positives are indicated with bold text General: chills, fever or night sweats Eyes: blurry vision or double vision ENT: epistaxis or sore throat Allergy/Immunology: itchy/watery eyes or nasal congestion Hematologic/Lymphatic: bleeding problems, blood clots or swollen lymph nodes Endocrine: temperature intolerance or unexpected weight changes Breast: new or changing breast lumps or nipple discharge Resp: cough, shortness of breath, or wheezing CV: chest pain or dyspnea on exertion GI: as per HPI GU: dysuria, trouble voiding, or hematuria MSK: joint pain or joint stiffness Neuro: TIA or stroke symptoms Derm: pruritus and skin lesion changes Psych: anxiety and depression  Objective   PE Last menstrual period 09/09/2009. Constitutional: NAD; conversant; no deformities Eyes: Moist conjunctiva; no lid lag; anicteric; PERRL Neck: Trachea midline; no thyromegaly Lungs: Normal respiratory effort; no tactile fremitus CV: RRR; no palpable thrills; no pitting edema GI: Abd Soft, non-tender; no palpable hepatosplenomegaly MSK: Normal range of motion of extremities; no clubbing/cyanosis Psychiatric: Appropriate affect; alert and oriented x3 Lymphatic: No palpable  cervical or axillary lymphadenopathy  No results found for this or any previous visit (from the past 24 hour(s)).  Imaging Orders  No imaging studies ordered today     Assessment and Plan   [name] is a 65 year old female presenting for bariatric surgery consultation. The patient attended the information session and is interested in pursuing bariatric surgery. I entered the patient's information into the [facility] bariatric risk-benefit calculator. I used this as a outlined to facilitate our discussion of the risks, and benefits of bariatric surgery, specifically comparing laparoscopic sleeve gastrectomy versus laparoscopic Roux-en-Y gastric bypass. The patient is interested in pursuing a sleeve gastrectomy. She has the following comorbidities related to obesity: hypertension, hyperlipidemia, pre-diabetes.  Her preoperative pathway included: - Bloodwork - Nutrition consult - completed with Alexis [name] - Chest x-ray 08/06/20 No active cardiopulmonary disease. - EKG 08/06/20 NSR and T wave abnormality - Cardiology consultation  with Dr. Margaretann Loveless 11/02/20 Carlton Adam completed, low risk - Psychology evaluation - Upper endoscopy with biopsy - Hill Grade 3 GE junction  He presents today for sleeve gastrectomy.  The procedure itself as well as its risks, benefits and alternatives were discussed and the patient granted consent to proceed.  We will proceed as scheduled.     Felicie Morn, MD  [facility] Use [email or web address] to contact on call provider

## 2021-08-16 NOTE — Discharge Instructions (Signed)

## 2021-08-16 NOTE — Transfer of Care (Signed)
Immediate Anesthesia Transfer of Care Note  Patient: [name]  Procedure(s) Performed: ROBOTIC SLEEVE GASTRECTOMY (Abdomen) UPPER GI ENDOSCOPY  Patient Location: PACU  Anesthesia Type:General  Level of Consciousness: awake, alert  and patient cooperative  Airway & Oxygen Therapy: Patient Spontanous Breathing and Patient connected to face mask oxygen  Post-op Assessment: Report given to RN and Post -op Vital signs reviewed and stable  Post vital signs: Reviewed and stable  Last Vitals:  Vitals Value Taken Time  BP 141/84 08/16/21 1326  Temp    Pulse 89 08/16/21 1328  Resp 23 08/16/21 1328  SpO2 100 % 08/16/21 1328  Vitals shown include unvalidated device data.  Last Pain:  Vitals:   08/16/21 1006  TempSrc:   PainSc: 0-No pain         Complications: No notable events documented.

## 2021-08-16 NOTE — Anesthesia Procedure Notes (Signed)
Procedure Name: Intubation Date/Time: 08/16/2021 11:41 AM Performed by: Eben Burow, CRNA Pre-anesthesia Checklist: Patient identified, Emergency Drugs available, Suction available, Patient being monitored and Timeout performed Patient Re-evaluated:Patient Re-evaluated prior to induction Oxygen Delivery Method: Circle system utilized Preoxygenation: Pre-oxygenation with 100% oxygen Induction Type: IV induction Ventilation: Mask ventilation without difficulty Laryngoscope Size: Mac and 4 Grade View: Grade I Tube type: Oral Tube size: 7.0 mm Number of attempts: 1 Airway Equipment and Method: Stylet Placement Confirmation: ETT inserted through vocal cords under direct vision, positive ETCO2 and breath sounds checked- equal and bilateral Secured at: 22 cm Tube secured with: Tape Dental Injury: Teeth and Oropharynx as per pre-operative assessment

## 2021-08-16 NOTE — Anesthesia Preprocedure Evaluation (Signed)
Anesthesia Evaluation  Patient identified by MRN, date of birth, ID band Patient awake    Reviewed: Allergy & Precautions, NPO status , Patient's Chart, lab work & pertinent test results  Airway Mallampati: II  TM Distance: >3 FB Neck ROM: Full    Dental no notable dental hx.    Pulmonary asthma , sleep apnea , former smoker,    Pulmonary exam normal breath sounds clear to auscultation       Cardiovascular hypertension, Pt. on medications Normal cardiovascular exam Rhythm:Regular Rate:Normal     Neuro/Psych Depression negative neurological ROS  negative psych ROS   GI/Hepatic negative GI ROS, Neg liver ROS,   Endo/Other  Morbid obesity  Renal/GU negative Renal ROS  negative genitourinary   Musculoskeletal  (+) Arthritis , Osteoarthritis,    Abdominal (+) + obese,   Peds negative pediatric ROS (+)  Hematology negative hematology ROS (+)   Anesthesia Other Findings   Reproductive/Obstetrics negative OB ROS                             Anesthesia Physical  Anesthesia Plan  ASA: 3  Anesthesia Plan: General   Post-op Pain Management:    Induction: Intravenous  PONV Risk Score and Plan: 3 and Treatment may vary due to age or medical condition, Ondansetron, Dexamethasone and Midazolam  Airway Management Planned: Oral ETT  Additional Equipment:   Intra-op Plan:   Post-operative Plan: Extubation in OR  Informed Consent: I have reviewed the patients History and Physical, chart, labs and discussed the procedure including the risks, benefits and alternatives for the proposed anesthesia with the patient or authorized representative who has indicated his/her understanding and acceptance.     Dental advisory given  Plan Discussed with: CRNA and Surgeon  Anesthesia Plan Comments:         Anesthesia Quick Evaluation

## 2021-08-16 NOTE — Progress Notes (Signed)

## 2021-08-16 NOTE — Progress Notes (Signed)
PHARMACY CONSULT FOR:  Risk Assessment for Post-Discharge VTE Following Bariatric Surgery  Post-Discharge VTE Risk Assessment: This patient's probability of 30-day post-discharge VTE is increased due to the factors marked: x Sleeve gastrectomy   Liver disorder (transplant, cirrhosis, or nonalcoholic steatohepatitis)   Hx of VTE   Hemorrhage requiring transfusion   GI perforation, leak, or obstruction   ====================================================    Female  x  Age >/=60 years  x  BMI >/=50 kg/m2    CHF    Dyspnea at Rest    Paraplegia  x  Non-gastric-band surgery    Operation Time >/=3 hr    Return to OR     Length of Stay >/= 3 d   Hypercoagulable condition   Significant venous stasis      Predicted probability of 30-day post-discharge VTE: 0.52%  Other patient-specific factors to consider:   Recommendation for Discharge: Enoxaparin 60 mg [name] q12h x 2 weeks post-discharge   [name] is a 66 y.o. female who underwent  sleeve gastrectomy, upper GI endoscopy on 08/16/2021   Case start: 1153 Case end: 1318   Allergies  Allergen Reactions   Ketorolac Tromethamine Shortness Of Breath and Palpitations   Atrovent [Ipratropium] Hives and Rash   Latex Rash and Other (See Comments)    NO POWDERED GLOVES!!!!   Tape Dermatitis    Irritates skin     Patient Measurements: Height: 5\' 5"  (165.1 cm) Weight: (!) 156.2 kg (344 lb 6.4 oz) IBW/kg (Calculated) : 57 Body mass index is 57.31 kg/m.  No results for input(s): WBC, HGB, HCT, PLT, APTT, CREATININE, LABCREA, CREATININE, CREAT24HRUR, MG, PHOS, ALBUMIN, PROT, ALBUMIN, AST, ALT, ALKPHOS, BILITOT, BILIDIR, IBILI in the last 72 hours. Estimated Creatinine Clearance: 108.5 mL/min (by C-G formula based on SCr of 0.7 mg/dL).    Past Medical History:  Diagnosis Date   Abnormal EKG 06/2003   History of inverted T waves V1-V3. Normal 2D echo (07/23/2003): LVEF 65%.   Anemia    BL Hgb 11-12. Ferritin 14 - low  normal (08/2007). Colonoscopy 2009 - external hemorrhoids (excellent prep). Last anemia panel (12/2010) - Iron  24, TIBC 269,  B12  316, Folate 11.9, Ferritin 73.   Asthma    B12 deficiency    Back pain    Chest pain    Degenerative joint disease    BL knees (L>R), lumbar spine. Followed by Sports Medicine, Dr. 01/2011.   Dyspnea    History of multiple pulmonary nodules    Incidental finding: CT Abd/ Pelvis (04/2010) - Several small lower lobe lung nodules, including one pure ground-glass pulmonary nodule measuring 8 mm in the left lower lobe.  Recommend follow-up chest CT (IV contrast preferred) in 6 months to document stability. //  CT Abd/ Pelvis (07/2010) -  3 mm RLL and  8 mm LLL nodule stable.  Other nodules unchanged, likely benign.   Hyperlipidemia    Hypertension    Incarcerated ventral hernia 04/2010   Noted on CT Abd/ pelvis (04/2010). Patient now s/p ventral hernia repair by Dr. 05/2010 (12/2010)   Insomnia    Knee osteoarthritis    s/p Left total knee replacement (06/2011)   Left hip pain    Left ventricular diastolic dysfunction    Leg edema    Lower extremity edema    Chronic. 2D echo (2005) - EF 65%.   Obesity    OSA (obstructive sleep apnea)    Palpitations    Positive D dimer    Pre-diabetes  Right knee pain      Medications Prior to Admission  Medication Sig Dispense Refill Last Dose   albuterol (VENTOLIN HFA) 108 (90 Base) MCG/ACT inhaler Inhale 1-2 puffs into the lungs every 6 (six) hours as needed for wheezing or shortness of breath. 3 each 0 Past Month   budesonide-formoterol (SYMBICORT) 160-4.5 MCG/ACT inhaler Inhale 2 puffs into the lungs 2 (two) times daily. (Patient taking differently: Inhale 2 puffs into the lungs daily as needed (Wheezing and Asthma).) 3 each 4 Past Month   cholecalciferol (VITAMIN D3) 25 MCG (1000 UNIT) tablet Take 1,000 Units by mouth daily.   08/15/2021   diclofenac (VOLTAREN) 75 MG EC tablet Take 1 tablet (75 mg total) by mouth 2 (two)  times daily as needed. 40 tablet 0 Past Month   hydrochlorothiazide (HYDRODIURIL) 25 MG tablet Take 1 tablet (25 mg total) by mouth daily. 90 tablet 3 08/15/2021   HYDROcodone-acetaminophen (NORCO/VICODIN) 5-325 MG tablet Take 1 tablet by mouth every 6 (six) hours as needed for moderate pain.      lisinopril (ZESTRIL) 40 MG tablet TAKE 1 TABLET BY MOUTH  DAILY 90 tablet 3 08/16/2021   rosuvastatin (CRESTOR) 10 MG tablet Take 1 tablet (10 mg total) by mouth daily. (Patient taking differently: Take 5 mg by mouth daily.) 30 tablet 3 Past Month   tiZANidine (ZANAFLEX) 4 MG tablet Take 1 tablet (4 mg total) by mouth every 12 (twelve) hours as needed for muscle spasms. 60 tablet 3 Past Month   trolamine salicylate (ASPERCREME) 10 % cream Apply 1 application topically as needed for muscle pain.   Past Month     Loralee Pacas, PharmD, BCPS Pharmacy: [phone] 08/16/2021,1:56 PM

## 2021-08-17 ENCOUNTER — Encounter (HOSPITAL_COMMUNITY): Payer: Self-pay | Admitting: Surgery

## 2021-08-17 LAB — CBC WITH DIFFERENTIAL/PLATELET
Abs Immature Granulocytes: 0.03 10*3/uL (ref 0.00–0.07)
Basophils Absolute: 0 10*3/uL (ref 0.0–0.1)
Basophils Relative: 0 %
Eosinophils Absolute: 0 10*3/uL (ref 0.0–0.5)
Eosinophils Relative: 0 %
HCT: 37.2 % (ref 36.0–46.0)
Hemoglobin: 11.6 g/dL — ABNORMAL LOW (ref 12.0–15.0)
Immature Granulocytes: 0 %
Lymphocytes Relative: 16 %
Lymphs Abs: 1.7 10*3/uL (ref 0.7–4.0)
MCH: 28 pg (ref 26.0–34.0)
MCHC: 31.2 g/dL (ref 30.0–36.0)
MCV: 89.6 fL (ref 80.0–100.0)
Monocytes Absolute: 0.6 10*3/uL (ref 0.1–1.0)
Monocytes Relative: 6 %
Neutro Abs: 8.1 10*3/uL — ABNORMAL HIGH (ref 1.7–7.7)
Neutrophils Relative %: 78 %
Platelets: 329 10*3/uL (ref 150–400)
RBC: 4.15 MIL/uL (ref 3.87–5.11)
RDW: 14.6 % (ref 11.5–15.5)
WBC: 10.5 10*3/uL (ref 4.0–10.5)
nRBC: 0 % (ref 0.0–0.2)

## 2021-08-17 LAB — SURGICAL PATHOLOGY

## 2021-08-17 MED ORDER — ENOXAPARIN SODIUM 60 MG/0.6ML IJ SOSY
60.0000 mg | PREFILLED_SYRINGE | Freq: Two times a day (BID) | INTRAMUSCULAR | 0 refills | Status: DC
Start: 2021-08-17 — End: 2021-09-06

## 2021-08-17 MED ORDER — ENOXAPARIN (LOVENOX) PATIENT EDUCATION KIT
PACK | Freq: Once | Status: AC
  Filled 2021-08-17: qty 1

## 2021-08-17 NOTE — Progress Notes (Signed)
Patient ambulated 40 ft in hallway. During ambulation RN noticed a slight change in patient's gait. RN asked patient is she was feeling okay. Patient responded "I am feeling very lightheaded." RN asked nearby staff member to grab patient a chair so she could sit. RN obtained vitals immediately and they read as followed BP: 132/71 Map 89 HR 87 and 02 Sat 98% on room air. Patient recovered for about 3 minutes sitting in chair and then proceeded to ambulate back to room. Patient resting comfortably in chair. Call light in reach. No further complaints of lightheadedness at this time.

## 2021-08-17 NOTE — Progress Notes (Signed)
Progress Note: General Surgery Service   Chief Complaint/Subjective: Nausea, Pain - worst in RUQ Dizzy when up walking  Objective: Vital signs in last 24 hours: Temp:  [97.4 F (36.3 C)-98.6 F (37 C)] 98.6 F (37 C) (02/07 0502) Pulse Rate:  [82-105] 86 (02/07 0502) Resp:  [14-24] 18 (02/07 0502) BP: (119-162)/(60-85) 119/70 (02/07 0502) SpO2:  [94 %-100 %] 99 % (02/07 0502) Last BM Date: 08/14/21  Intake/Output from previous day: 02/06 0701 - 02/07 0700 In: 2063.6 [P.O.:25; I.V.:1938.6; IV Piggyback:100] Out: 1170 [Urine:1150; Blood:20] Intake/Output this shift: No intake/output data recorded.  GI: Abd incisions c/d/I w/ glue; no palpable hepatosplenomegaly  Lab Results: CBC  Recent Labs    08/16/21 1724 08/17/21 0445  WBC 13.2* 10.5  HGB 13.6 11.6*  HCT 43.4 37.2  PLT 330 329   BMET Recent Labs    08/16/21 1724  CREATININE 0.89   PT/INR No results for input(s): LABPROT, INR in the last 72 hours. ABG No results for input(s): PHART, HCO3 in the last 72 hours.  Invalid input(s): PCO2, PO2  Anti-infectives: Anti-infectives (From admission, onward)    Start     Dose/Rate Route Frequency Ordered Stop   08/16/21 0945  cefoTEtan (CEFOTAN) 2 g in sodium chloride 0.9 % 100 mL IVPB        2 g 200 mL/hr over 30 Minutes Intravenous On call to O.R. 08/16/21 0931 08/16/21 1205       Medications: Scheduled Meds:  acetaminophen  1,000 mg Oral Q8H   Or   acetaminophen (TYLENOL) oral liquid 160 mg/5 mL  1,000 mg Oral Q8H   enoxaparin (LOVENOX) injection  30 mg Subcutaneous Q12H   enoxaparin   Does not apply Once   lisinopril  40 mg Oral Daily   pantoprazole (PROTONIX) IV  40 mg Intravenous QHS   Ensure Max Protein  2 oz Oral Q2H   rosuvastatin  5 mg Oral Daily   Continuous Infusions:  lactated ringers 75 mL/hr at 08/16/21 1656   PRN Meds:.albuterol, morphine injection, Muscle Rub, ondansetron (ZOFRAN) IV, oxyCODONE, simethicone,  tiZANidine  Assessment/Plan: [name] is a 66 year old female who underwent robotic sleeve gastrectomy with upper endoscopy on 08/16/21.   Significant nausea and pain still today Continue protocol Anticipate discharge tomorrow if nausea passes  Will discharge home with 2 weeks of Lovenox per pharmacy recommendations   LOS: 1 day      Holly Ore, MD  [facility] Use [email or web address] to contact on call provider

## 2021-08-17 NOTE — Progress Notes (Signed)
°  Transition of Care [facility]) Screening Note   Patient Details  Name: [name] Date of Birth: May 13, 1956   Transition of Care [facility]) CM/SW Contact:    Amada Jupiter, LCSW Phone Number: 08/17/2021, 1:20 PM    Transition of Care Department [facility]) has reviewed patient and no TOC needs have been identified at this time. We will continue to monitor patient advancement through interdisciplinary progression rounds. If new patient transition needs arise, please place a TOC consult.

## 2021-08-17 NOTE — Progress Notes (Signed)
°   08/17/21 1100  Mobility  Activity Ambulated with assistance in hallway  Level of Assistance Contact guard assist, steadying assist  Assistive Device Front wheel walker  Distance Ambulated (ft) 50 ft  Activity Response Tolerated well  $Mobility charge 1 Mobility   Pt agreeable to mobilize this morning. Ambulated about 17ft in hall with RW, tolerated well. She did note prior to session start that she was experiencing 8/10 RLQ pain. The pain remained throughout session. Pt left in bed with call bell at side and family present. RN notified of session.  [place] [employer] Specialist Acute Rehab Services Office: [phone]

## 2021-08-17 NOTE — Progress Notes (Signed)
Patient was asked if she would like to ambulate in the hallway, she stated she feels too weak. I reinforced the use of her incentive spirometer and the importance, patient verbalized understanding.

## 2021-08-17 NOTE — Progress Notes (Addendum)
Patient has had 49ml of water on shift. After intake of water patient immediatly complains of pain and nausea. Patient has had one episode of emesis after first 64ml water intake.

## 2021-08-17 NOTE — Progress Notes (Signed)
Patient alert and oriented, pt continues to c/o pain. Patient has not advanced to protein, still sipping water.  Reviewed Gastric sleeve discharge instructions with patient  and family at bedside. Patient is able to articulate understanding.  Provided information on BELT program, Support Group and [facility] outpatient pharmacy. All questions answered, will continue to monitor.   Encouraged family to bring alkaline water for pt to try.  Will continue to encourage PO intake, ambulation and I.S. use.

## 2021-08-18 ENCOUNTER — Other Ambulatory Visit (HOSPITAL_COMMUNITY): Payer: Self-pay

## 2021-08-18 ENCOUNTER — Other Ambulatory Visit: Payer: Self-pay

## 2021-08-18 LAB — CBC WITH DIFFERENTIAL/PLATELET
Abs Immature Granulocytes: 0.02 10*3/uL (ref 0.00–0.07)
Basophils Absolute: 0 10*3/uL (ref 0.0–0.1)
Basophils Relative: 1 %
Eosinophils Absolute: 0.2 10*3/uL (ref 0.0–0.5)
Eosinophils Relative: 3 %
HCT: 38.1 % (ref 36.0–46.0)
Hemoglobin: 11.8 g/dL — ABNORMAL LOW (ref 12.0–15.0)
Immature Granulocytes: 0 %
Lymphocytes Relative: 23 %
Lymphs Abs: 1.8 10*3/uL (ref 0.7–4.0)
MCH: 28.4 pg (ref 26.0–34.0)
MCHC: 31 g/dL (ref 30.0–36.0)
MCV: 91.8 fL (ref 80.0–100.0)
Monocytes Absolute: 0.7 10*3/uL (ref 0.1–1.0)
Monocytes Relative: 9 %
Neutro Abs: 5.1 10*3/uL (ref 1.7–7.7)
Neutrophils Relative %: 64 %
Platelets: 296 10*3/uL (ref 150–400)
RBC: 4.15 MIL/uL (ref 3.87–5.11)
RDW: 14.9 % (ref 11.5–15.5)
WBC: 7.9 10*3/uL (ref 4.0–10.5)
nRBC: 0 % (ref 0.0–0.2)

## 2021-08-18 NOTE — Progress Notes (Signed)
Patient alert and oriented, Post op day 2.  Provided support and encouragement.  Encouraged pulmonary toilet, ambulation and small sips of liquids.  Discussed with pt the schedule for PO intake to advance to protein shakes today. All questions answered.  Will continue to monitor.   Reviewed Lovenox teaching kit and pt reviewed Lovenox "Patient-Self Injection Video".

## 2021-08-18 NOTE — Progress Notes (Signed)
°   08/18/21 1500  Mobility  Activity Ambulated with assistance in hallway  Level of Assistance Standby assist, set-up cues, supervision of patient - no hands on  Assistive Device Front wheel walker  Distance Ambulated (ft) 90 ft  Activity Response Tolerated well  $Mobility charge 1 Mobility   Pt agreeable to mobilize this afternoon. Ambulated about 65ft in hall with RW, tolerated well. Noted 8/10 RLQ pain, similar to yesterday. Left pt in bed, call bell at side. RN/NT notified of session.   [place] Specialist Acute Rehab Services Office: [phone]

## 2021-08-18 NOTE — Progress Notes (Signed)
Progress Note: General Surgery Service   Chief Complaint/Subjective: Nausea a little better this morning, doing better with different water Pain slowing her down a little but plans to push through it today  Objective: Vital signs in last 24 hours: Temp:  [98.4 F (36.9 C)-98.8 F (37.1 C)] 98.4 F (36.9 C) (02/08 0542) Pulse Rate:  [71-85] 71 (02/08 0542) Resp:  [15-18] 17 (02/08 0542) BP: (119-142)/(56-70) 122/70 (02/08 0542) SpO2:  [95 %-100 %] 96 % (02/08 0542) Last BM Date: 08/14/21  Intake/Output from previous day: 02/07 0701 - 02/08 0700 In: 1611.4 [P.O.:240; I.V.:1371.4] Out: 1500 [Urine:1500] Intake/Output this shift: No intake/output data recorded.  GI: Abd incisions c/d/I w/ glue  Lab Results: CBC  Recent Labs    08/17/21 0445 08/18/21 0426  WBC 10.5 7.9  HGB 11.6* 11.8*  HCT 37.2 38.1  PLT 329 296    BMET Recent Labs    08/16/21 1724  CREATININE 0.89    PT/INR No results for input(s): LABPROT, INR in the last 72 hours. ABG No results for input(s): PHART, HCO3 in the last 72 hours.  Invalid input(s): PCO2, PO2  Anti-infectives: Anti-infectives (From admission, onward)    Start     Dose/Rate Route Frequency Ordered Stop   08/16/21 0945  cefoTEtan (CEFOTAN) 2 g in sodium chloride 0.9 % 100 mL IVPB        2 g 200 mL/hr over 30 Minutes Intravenous On call to O.R. 08/16/21 0931 08/16/21 1205       Medications: Scheduled Meds:  acetaminophen  1,000 mg Oral Q8H   Or   acetaminophen (TYLENOL) oral liquid 160 mg/5 mL  1,000 mg Oral Q8H   enoxaparin (LOVENOX) injection  30 mg Subcutaneous Q12H   lisinopril  40 mg Oral Daily   pantoprazole (PROTONIX) IV  40 mg Intravenous QHS   Ensure Max Protein  2 oz Oral Q2H   rosuvastatin  5 mg Oral Daily   Continuous Infusions:  lactated ringers 75 mL/hr at 08/16/21 1656   PRN Meds:.albuterol, morphine injection, Muscle Rub, ondansetron (ZOFRAN) IV, oxyCODONE, simethicone,  tiZANidine  Assessment/Plan: [name] is a 66 year old female who underwent robotic sleeve gastrectomy with upper endoscopy on 08/16/21.   Nausea and pain improving Try some protein shakes today If she does well with protein and fluid intake today, possibly home this afternoon/evening Continue bariatric protocol   Will discharge home with 2 weeks of Lovenox per pharmacy recommendations   LOS: 2 days      Felicie Morn, MD  [facility] Use [email or web address] to contact on call provider

## 2021-08-19 NOTE — Care Management Important Message (Signed)
Important Message  Patient Details IM Letter given to the Patient. Name: [name] MRN: [id number] Date of Birth: [birth year]   Medicare Important Message Given:  Yes     Caren Macadam 08/19/2021, 1:29 PM

## 2021-08-19 NOTE — Progress Notes (Signed)
°   08/19/21 1400  Mobility  Activity Ambulated with assistance in hallway  Level of Assistance Standby assist, set-up cues, supervision of patient - no hands on  Assistive Device Front wheel walker  Distance Ambulated (ft) 120 ft  Activity Response Tolerated well  $Mobility charge 1 Mobility   Pt agreeable to mobilize this afternoon. She requested to use the bathroom prior to session. Didi this independently. Ambulated about 178ft in hall with RW, tolerated well. Pt noted 7/10 pain to the RLQ which she states feels better than yesterday. Otherwise no complaints. Left pt in bed, call bell at side. RN/NT notified of session.   Tim[place] [employer] Mobility Specialist Acute Rehab Services Office: [phone]

## 2021-08-19 NOTE — Progress Notes (Signed)
PHARMACY CONSULT FOR:  Risk Assessment for Post-Discharge VTE Following Bariatric Surgery  Post-Discharge VTE Risk Assessment: This patient's probability of 30-day post-discharge VTE is increased due to the factors marked: x Sleeve gastrectomy   Liver disorder (transplant, cirrhosis, or nonalcoholic steatohepatitis)   Hx of VTE   Hemorrhage requiring transfusion   GI perforation, leak, or obstruction   ====================================================    Female  x  Age >/=60 years  x  BMI >/=50 kg/m2    CHF    Dyspnea at Rest    Paraplegia  x  Non-gastric-band surgery    Operation Time >/=3 hr    Return to OR   x Length of Stay >/= 3 d   Hypercoagulable condition   Significant venous stasis      Predicted probability of 30-day post-discharge VTE: 0.81%  Other patient-specific factors to consider:   Recommendation for Discharge: Enoxaparin 60 mg [place] q12h x 2 weeks post-discharge - NO CHANGE   [name] is a 66 y.o. female who underwent  sleeve gastrectomy, upper GI endoscopy on 08/16/2021   Case start: 1153 Case end: 1318   Allergies  Allergen Reactions   Ketorolac Tromethamine Shortness Of Breath and Palpitations   Atrovent [Ipratropium] Hives and Rash   Latex Rash and Other (See Comments)    NO POWDERED GLOVES!!!!   Tape Dermatitis    Irritates skin     Patient Measurements: Height: 5\' 5"  (165.1 cm) Weight: (!) 156.2 kg (344 lb 6.4 oz) IBW/kg (Calculated) : 57 Body mass index is 57.31 kg/m.  Recent Labs    08/16/21 1724 08/17/21 0445 08/18/21 0426  WBC 13.2* 10.5 7.9  HGB 13.6 11.6* 11.8*  HCT 43.4 37.2 38.1  PLT 330 329 296  CREATININE 0.89  --   --    Estimated Creatinine Clearance: 97.5 mL/min (by C-G formula based on SCr of 0.89 mg/dL).    Past Medical History:  Diagnosis Date   Abnormal EKG 06/2003   History of inverted T waves V1-V3. Normal 2D echo (07/23/2003): LVEF 65%.   Anemia    BL Hgb 11-12. Ferritin 14 - low normal  (08/2007). Colonoscopy 2009 - external hemorrhoids (excellent prep). Last anemia panel (12/2010) - Iron  24, TIBC 269,  B12  316, Folate 11.9, Ferritin 73.   Asthma    B12 deficiency    Back pain    Chest pain    Degenerative joint disease    BL knees (L>R), lumbar spine. Followed by Sports Medicine, Dr. 01/2011.   Dyspnea    History of multiple pulmonary nodules    Incidental finding: CT Abd/ Pelvis (04/2010) - Several small lower lobe lung nodules, including one pure ground-glass pulmonary nodule measuring 8 mm in the left lower lobe.  Recommend follow-up chest CT (IV contrast preferred) in 6 months to document stability. //  CT Abd/ Pelvis (07/2010) -  3 mm RLL and  8 mm LLL nodule stable.  Other nodules unchanged, likely benign.   Hyperlipidemia    Hypertension    Incarcerated ventral hernia 04/2010   Noted on CT Abd/ pelvis (04/2010). Patient now s/p ventral hernia repair by Dr. 05/2010 (12/2010)   Insomnia    Knee osteoarthritis    s/p Left total knee replacement (06/2011)   Left hip pain    Left ventricular diastolic dysfunction    Leg edema    Lower extremity edema    Chronic. 2D echo (2005) - EF 65%.   Obesity    OSA (obstructive sleep  apnea)    Palpitations    Positive D dimer    Pre-diabetes    Right knee pain      Medications Prior to Admission  Medication Sig Dispense Refill Last Dose   albuterol (VENTOLIN HFA) 108 (90 Base) MCG/ACT inhaler Inhale 1-2 puffs into the lungs every 6 (six) hours as needed for wheezing or shortness of breath. 3 each 0 Past Month   budesonide-formoterol (SYMBICORT) 160-4.5 MCG/ACT inhaler Inhale 2 puffs into the lungs 2 (two) times daily. (Patient taking differently: Inhale 2 puffs into the lungs daily as needed (Wheezing and Asthma).) 3 each 4 Past Month   cholecalciferol (VITAMIN D3) 25 MCG (1000 UNIT) tablet Take 1,000 Units by mouth daily.   08/15/2021   diclofenac (VOLTAREN) 75 MG EC tablet Take 1 tablet (75 mg total) by mouth 2 (two) times  daily as needed. 40 tablet 0 Past Month   hydrochlorothiazide (HYDRODIURIL) 25 MG tablet Take 1 tablet (25 mg total) by mouth daily. 90 tablet 3 08/15/2021   HYDROcodone-acetaminophen (NORCO/VICODIN) 5-325 MG tablet Take 1 tablet by mouth every 6 (six) hours as needed for moderate pain.      lisinopril (ZESTRIL) 40 MG tablet TAKE 1 TABLET BY MOUTH  DAILY 90 tablet 3 08/16/2021   rosuvastatin (CRESTOR) 10 MG tablet Take 1 tablet (10 mg total) by mouth daily. (Patient taking differently: Take 5 mg by mouth daily.) 30 tablet 3 Past Month   tiZANidine (ZANAFLEX) 4 MG tablet Take 1 tablet (4 mg total) by mouth every 12 (twelve) hours as needed for muscle spasms. 60 tablet 3 Past Month   trolamine salicylate (ASPERCREME) 10 % cream Apply 1 application topically as needed for muscle pain.   Past Month     Loralee Pacas, PharmD, BCPS Pharmacy: [phone] 08/19/2021,8:10 AM

## 2021-08-19 NOTE — Progress Notes (Signed)
Progress Note: General Surgery Service   Chief Complaint/Subjective: Nausea improved some.  Working on protein shakes.  Still has a strange feeling as she swallows and things enter her stomach.  Objective: Vital signs in last 24 hours: Temp:  [97.5 F (36.4 C)-99.2 F (37.3 C)] 98.1 F (36.7 C) (02/09 0500) Pulse Rate:  [69-88] 84 (02/09 0500) Resp:  [16-17] 16 (02/09 0500) BP: (111-138)/(58-71) 134/66 (02/09 0500) SpO2:  [94 %-100 %] 94 % (02/09 0500) Last BM Date: 08/14/21  Intake/Output from previous day: 02/08 0701 - 02/09 0700 In: 2528.1 [P.O.:420; I.V.:2108.1] Out: 600 [Urine:600] Intake/Output this shift: No intake/output data recorded.  GI: Abd incisions c/d/I w/ glue  Lab Results: CBC  Recent Labs    08/17/21 0445 08/18/21 0426  WBC 10.5 7.9  HGB 11.6* 11.8*  HCT 37.2 38.1  PLT 329 296    BMET Recent Labs    08/16/21 1724  CREATININE 0.89    PT/INR No results for input(s): LABPROT, INR in the last 72 hours. ABG No results for input(s): PHART, HCO3 in the last 72 hours.  Invalid input(s): PCO2, PO2  Anti-infectives: Anti-infectives (From admission, onward)    Start     Dose/Rate Route Frequency Ordered Stop   08/16/21 0945  cefoTEtan (CEFOTAN) 2 g in sodium chloride 0.9 % 100 mL IVPB        2 g 200 mL/hr over 30 Minutes Intravenous On call to O.R. 08/16/21 0931 08/16/21 1205       Medications: Scheduled Meds:  acetaminophen  1,000 mg Oral Q8H   Or   acetaminophen (TYLENOL) oral liquid 160 mg/5 mL  1,000 mg Oral Q8H   enoxaparin (LOVENOX) injection  30 mg Subcutaneous Q12H   lisinopril  40 mg Oral Daily   pantoprazole (PROTONIX) IV  40 mg Intravenous QHS   Ensure Max Protein  2 oz Oral Q2H   rosuvastatin  5 mg Oral Daily   Continuous Infusions:  lactated ringers 75 mL/hr at 08/19/21 0600   PRN Meds:.albuterol, morphine injection, Muscle Rub, ondansetron (ZOFRAN) IV, oxyCODONE, simethicone, tiZANidine  Assessment/Plan: Ms. Hoppes is  a 66 year old female who underwent robotic sleeve gastrectomy with upper endoscopy on 08/16/21.   If she feels up to it - discharge this afternoon Longer hospital stay due to slow PO tolerance, pain management, and decreased mobility at baseline Continue bariatric protocol   Will discharge home with 2 weeks of Lovenox per pharmacy recommendations   LOS: 3 days      Felicie Morn, MD  [facility] Use [email or web address] to contact on call provider

## 2021-08-20 ENCOUNTER — Other Ambulatory Visit (HOSPITAL_COMMUNITY): Payer: Self-pay

## 2021-08-20 NOTE — Discharge Summary (Signed)
Patient ID: [name] [id number] 66 y.o. [birth year]  08/16/2021  Discharge date and time: 08/20/2021  Admitting Physician: Hyman Hopes Pollyanna Levay  Discharge Physician: Hyman Hopes Josilyn Shippee  Admission Diagnoses: Morbid obesity with BMI of 50.0-59.9, adult (HCC) [E66.01, Z68.43] Patient Active Problem List   Diagnosis Date Noted   Morbid obesity with BMI of 50.0-59.9, adult (HCC) 08/16/2021   Radiculopathy due to lumbar intervertebral disc disorder 12/02/2019   Depression 12/11/2018   Vitamin D deficiency 07/18/2018   Prediabetes 07/18/2018   Morbid obesity (HCC) 07/18/2018   Insomnia 11/06/2017   Chest pain 10/25/2017   Asthma in adult, mild intermittent, uncomplicated 10/25/2017   Hypertension 10/25/2017   Obstructive sleep apnea 10/25/2017   Left ventricular diastolic dysfunction 10/25/2017   Positive D dimer 10/25/2017   Low vitamin B12 level 10/25/2017   Depression, major, single episode, complete remission (HCC) 09/29/2017   Pain due to total left knee replacement (HCC) 10/21/2016   B12 nutritional deficiency 09/27/2016   Hyperlipidemia 04/02/2012   Lower abdominal pain 12/30/2010   Preventative health care 09/10/2010   LUNG NODULE 05/10/2010   Low back pain 01/20/2009   Generalized osteoarthritis of multiple sites (left hip,knee,and lower back) 06/02/2008   ANEMIA-NOS 08/15/2006   Essential hypertension 08/11/2006   Allergic rhinitis 08/11/2006     Discharge Diagnoses: Morbid obesity Patient Active Problem List   Diagnosis Date Noted   Morbid obesity with BMI of 50.0-59.9, adult (HCC) 08/16/2021   Radiculopathy due to lumbar intervertebral disc disorder 12/02/2019   Depression 12/11/2018   Vitamin D deficiency 07/18/2018   Prediabetes 07/18/2018   Morbid obesity (HCC) 07/18/2018   Insomnia 11/06/2017   Chest pain 10/25/2017   Asthma in adult, mild intermittent, uncomplicated 10/25/2017   Hypertension 10/25/2017   Obstructive sleep apnea 10/25/2017   Left  ventricular diastolic dysfunction 10/25/2017   Positive D dimer 10/25/2017   Low vitamin B12 level 10/25/2017   Depression, major, single episode, complete remission (HCC) 09/29/2017   Pain due to total left knee replacement (HCC) 10/21/2016   B12 nutritional deficiency 09/27/2016   Hyperlipidemia 04/02/2012   Lower abdominal pain 12/30/2010   Preventative health care 09/10/2010   LUNG NODULE 05/10/2010   Low back pain 01/20/2009   Generalized osteoarthritis of multiple sites (left hip,knee,and lower back) 06/02/2008   ANEMIA-NOS 08/15/2006   Essential hypertension 08/11/2006   Allergic rhinitis 08/11/2006    Operations: Procedure(s): ROBOTIC SLEEVE GASTRECTOMY UPPER GI ENDOSCOPY  Admission Condition: good  Discharged Condition: good  Indication for Admission: Morbid obesity  Hospital Course: Ms. Saldarriaga underwent sleeve gastrectomy on 08/16/21, for morbid obesity.  She recovered in the hospital.  Pain limited her and she was a little slower to get back to her normal mobility - she uses a walker at home.  She was discharged on 08/20/21.  She will take two weeks of lovenox per pharmacy recommendations for VTE prophylaxis.  Consults: None  Significant Diagnostic Studies: none  Treatments: surgery: as above  Disposition: Home  Patient Instructions:  Allergies as of 08/20/2021       Reactions   Ketorolac Tromethamine Shortness Of Breath, Palpitations   Atrovent [ipratropium] Hives, Rash   Latex Rash, Other (See Comments)   NO POWDERED GLOVES!!!!   Tape Dermatitis   Irritates skin         Medication List     TAKE these medications    acetaminophen 500 MG tablet Commonly known as: TYLENOL Take 2 tablets (1,000 mg total) by mouth every 8 (eight) hours  for 5 days.   albuterol 108 (90 Base) MCG/ACT inhaler Commonly known as: VENTOLIN HFA Inhale 1-2 puffs into the lungs every 6 (six) hours as needed for wheezing or shortness of breath.   budesonide-formoterol 160-4.5  MCG/ACT inhaler Commonly known as: SYMBICORT Inhale 2 puffs into the lungs 2 (two) times daily. What changed:  when to take this reasons to take this   cholecalciferol 25 MCG (1000 UNIT) tablet Commonly known as: VITAMIN D3 Take 1,000 Units by mouth daily.   diclofenac 75 MG EC tablet Commonly known as: VOLTAREN Take 1 tablet (75 mg total) by mouth 2 (two) times daily as needed. Notes to patient: Avoid NSAIDs for 6-8 weeks after surgery   enoxaparin 60 MG/0.6ML injection Commonly known as: LOVENOX Inject 0.6 mLs (60 mg total) into the skin every 12 (twelve) hours for 14 days.   gabapentin 100 MG capsule Commonly known as: NEURONTIN Take 2 capsules (200 mg total) by mouth every 12 (twelve) hours.   hydrochlorothiazide 25 MG tablet Commonly known as: HYDRODIURIL Take 1 tablet (25 mg total) by mouth daily. Notes to patient: Monitor Blood Pressure Daily and keep a log for primary care physician.  Monitor for symptoms of dehydration.  You may need to make changes to your medications with rapid weight loss.     HYDROcodone-acetaminophen 5-325 MG tablet Commonly known as: NORCO/VICODIN Take 1 tablet by mouth every 6 (six) hours as needed for moderate pain.   lisinopril 40 MG tablet Commonly known as: ZESTRIL TAKE 1 TABLET BY MOUTH  DAILY Notes to patient: Monitor Blood Pressure Daily and keep a log for primary care physician.  You may need to make changes to your medications with rapid weight loss.     ondansetron 4 MG disintegrating tablet Commonly known as: ZOFRAN-ODT Take 1 tablet (4 mg total) by mouth every 6 (six) hours as needed for nausea or vomiting.   oxyCODONE 5 MG immediate release tablet Commonly known as: Oxy IR/ROXICODONE Take 1 tablet (5 mg total) by mouth every 6 (six) hours as needed for severe pain.   pantoprazole 40 MG tablet Commonly known as: PROTONIX Take 1 tablet (40 mg total) by mouth daily.   rosuvastatin 10 MG tablet Commonly known as:  CRESTOR Take 1 tablet (10 mg total) by mouth daily. What changed: how much to take   tiZANidine 4 MG tablet Commonly known as: Zanaflex Take 1 tablet (4 mg total) by mouth every 12 (twelve) hours as needed for muscle spasms.   trolamine salicylate 10 % cream Commonly known as: ASPERCREME Apply 1 application topically as needed for muscle pain.        Activity: no heavy lifting for 4 weeks Diet: Bariatric diet protocol Wound Care: keep wound clean and dry  Follow-up:  With Ivar Drape as scheduled  Signed: Hyman Hopes Shadai [facility]cl[facility]   08/20/2021, 8:25 AM

## 2021-08-20 NOTE — Progress Notes (Signed)
Patient alert and oriented, Post op day 4.  Provided support and encouragement.  Encouraged pulmonary toilet, ambulation and small sips of liquids.  Pt is tolerating PO intake and feels ready for discharge.  Reinforced education about meeting fluid and protein goals.  All questions answered.  Will continue to monitor. Will f/u with pt after discharge.

## 2021-08-21 ENCOUNTER — Telehealth (HOSPITAL_COMMUNITY): Payer: Self-pay | Admitting: *Deleted

## 2021-08-21 NOTE — Telephone Encounter (Signed)
1.  Tell me about your pain and pain management? Pt c/o right lower abdominal incisional pain with movement and exertion.  Pt states that she has not taken any medication and that the pain is "tolerable". Discussed with patient to try and splint her abdomen with changing positions to assist with the discomfort.  Encouraged pt to try options and/or contact [facility] if still concerned.   2.  Let's talk about fluid intake.  How much total fluid are you taking in? Pt states that she is working to meet goal of 64 oz of fluid today.  We discussed a timeline for patient to achieve at least 47-50oz of fluid today and tomorrow. Pt encouraged to continue to work towards meeting goal.  Pt instructed to assess status and suggestions daily utilizing Hydration Action Plan on discharge folder and to call [facility] if in the "red zone".   3.  How much protein have you taken in the last 2 days? Pt states that she is working to meet goal of goal of 60g of protein today.  Pt has not started protein this morning. We discussed a plan for patient to meet protein goal.  4.  Have you had nausea?  Tell me about when have experienced nausea and what you did to help? Pt denies nausea.   5.  Has the frequency or color changed with your urine? Pt states that she is urinating "enough" with no changes in frequency or urgency.     6.  Tell me what your incisions look like? "Incisions look fine". Pt denies a fever, chills.  Pt states incisions are not swollen, open, or draining.  Pt encouraged to call [facility] if incisions change.   7.  Have you been passing gas? BM? Pt states that she has not had a BM, but is passing gas.  Pt instructed to take either Miralax or MoM as instructed per "Gastric Bypass/Sleeve Discharge Home Care Instructions".  Pt to call surgeon's office if not able to have BM with medication.   8.  If a problem or question were to arise who would you call?  Do you know contact numbers for [facility], [facility], and [facility]? Pt denies  dehydration symptoms.  Pt can describe s/sx of dehydration.  Pt knows to call [facility] for surgical, [facility] for nutrition, and [facility] for non-urgent questions or concerns.   9.  How has the walking going? We discussed a plan for pt to set a timer for q2h or to get up and walk on the even hours throughout the day for intentional movement.   10. Are you still using your incentive spirometer?  If so, how often? Pt is using I.S. 2-3x/day.  Discussed pt using I.S. prior to 2qh walks. Highly encouraged to use incentive spirometer, at least 10x every hour while awake until she sees the surgeon.  11.  How are your vitamins and calcium going?  How are you taking them? Pt states that she will begin the supplements tomorrow.  Reinforced education about taking supplements at least two hours apart.  Pt has not been able to get Lovenox yet due to co-pay.  Pt states that she is working with her insurance to hopefully get them today.  Discussed taking Lovenox injections q12h.  Reminded patient that the first 30 days post-operatively are important for successful recovery.  Practice good hand hygiene, wearing a mask when appropriate (since optional in most places), and minimizing exposure to people who live outside of the home, especially if they are  exhibiting any respiratory, GI, or illness-like symptoms.

## 2021-08-31 ENCOUNTER — Other Ambulatory Visit: Payer: Self-pay

## 2021-08-31 ENCOUNTER — Encounter: Payer: Medicare Other | Attending: Surgery | Admitting: Skilled Nursing Facility1

## 2021-08-31 DIAGNOSIS — Z713 Dietary counseling and surveillance: Secondary | ICD-10-CM | POA: Insufficient documentation

## 2021-08-31 DIAGNOSIS — E785 Hyperlipidemia, unspecified: Secondary | ICD-10-CM | POA: Diagnosis not present

## 2021-08-31 DIAGNOSIS — Z9884 Bariatric surgery status: Secondary | ICD-10-CM | POA: Insufficient documentation

## 2021-08-31 DIAGNOSIS — G4733 Obstructive sleep apnea (adult) (pediatric): Secondary | ICD-10-CM | POA: Insufficient documentation

## 2021-09-02 NOTE — Progress Notes (Signed)
2 Week Post-Operative Nutrition Class   Patient was seen on 08/31/2021 for Post-Operative Nutrition education at the [facility].    Surgery date:  Surgery type: 352.6 Start weight at [facility]: 352.6 Weight today: 326.1 pounds Bowel Habits: Every day to every other day no complaints     The following the learning objectives were met by the patient during this course: Identifies Phase 3 (Soft, High Proteins) Dietary Goals and will begin from 2 weeks post-operatively to 2 months post-operatively Identifies appropriate sources of fluids and proteins  Identifies appropriate fat sources and healthy verses unhealthy fat types   States protein recommendations and appropriate sources post-operatively Identifies the need for appropriate texture modifications, mastication, and bite sizes when consuming solids Identifies appropriate fat consumption and sources Identifies appropriate multivitamin and calcium sources post-operatively Describes the need for physical activity post-operatively and will follow MD recommendations States when to call healthcare provider regarding medication questions or post-operative complications   Handouts given during class include: Phase 3A: Soft, High Protein Diet Handout Phase 3 High Protein Meals Healthy Fats   Follow-Up Plan: Patient will follow-up at [facility] in 6 weeks for 2 month post-op nutrition visit for diet advancement per MD.

## 2021-09-05 ENCOUNTER — Encounter (HOSPITAL_COMMUNITY): Payer: Self-pay | Admitting: Family Medicine

## 2021-09-05 ENCOUNTER — Other Ambulatory Visit: Payer: Self-pay

## 2021-09-05 ENCOUNTER — Emergency Department (HOSPITAL_COMMUNITY): Payer: Medicare Other

## 2021-09-05 ENCOUNTER — Observation Stay (HOSPITAL_COMMUNITY)
Admission: EM | Admit: 2021-09-05 | Discharge: 2021-09-06 | Disposition: A | Discharge status: Home Health | Payer: Medicare Other | Attending: Internal Medicine | Admitting: Internal Medicine

## 2021-09-05 DIAGNOSIS — E669 Obesity, unspecified: Secondary | ICD-10-CM | POA: Diagnosis not present

## 2021-09-05 DIAGNOSIS — N179 Acute kidney failure, unspecified: Secondary | ICD-10-CM | POA: Diagnosis present

## 2021-09-05 DIAGNOSIS — R404 Transient alteration of awareness: Secondary | ICD-10-CM | POA: Diagnosis not present

## 2021-09-05 DIAGNOSIS — R55 Syncope and collapse: Secondary | ICD-10-CM | POA: Diagnosis not present

## 2021-09-05 DIAGNOSIS — Z79899 Other long term (current) drug therapy: Secondary | ICD-10-CM | POA: Diagnosis not present

## 2021-09-05 DIAGNOSIS — E785 Hyperlipidemia, unspecified: Secondary | ICD-10-CM | POA: Diagnosis present

## 2021-09-05 DIAGNOSIS — Z7902 Long term (current) use of antithrombotics/antiplatelets: Secondary | ICD-10-CM | POA: Diagnosis not present

## 2021-09-05 DIAGNOSIS — E872 Acidosis, unspecified: Secondary | ICD-10-CM

## 2021-09-05 DIAGNOSIS — Z96652 Presence of left artificial knee joint: Secondary | ICD-10-CM | POA: Insufficient documentation

## 2021-09-05 DIAGNOSIS — X58XXXA Exposure to other specified factors, initial encounter: Secondary | ICD-10-CM | POA: Diagnosis not present

## 2021-09-05 DIAGNOSIS — Z91040 Latex allergy status: Secondary | ICD-10-CM | POA: Diagnosis not present

## 2021-09-05 DIAGNOSIS — E876 Hypokalemia: Secondary | ICD-10-CM

## 2021-09-05 DIAGNOSIS — R531 Weakness: Secondary | ICD-10-CM | POA: Diagnosis not present

## 2021-09-05 DIAGNOSIS — I10 Essential (primary) hypertension: Secondary | ICD-10-CM | POA: Diagnosis not present

## 2021-09-05 DIAGNOSIS — Z9884 Bariatric surgery status: Secondary | ICD-10-CM

## 2021-09-05 DIAGNOSIS — Z743 Need for continuous supervision: Secondary | ICD-10-CM | POA: Diagnosis not present

## 2021-09-05 DIAGNOSIS — G4733 Obstructive sleep apnea (adult) (pediatric): Secondary | ICD-10-CM | POA: Diagnosis not present

## 2021-09-05 DIAGNOSIS — J45909 Unspecified asthma, uncomplicated: Secondary | ICD-10-CM | POA: Insufficient documentation

## 2021-09-05 DIAGNOSIS — Z20822 Contact with and (suspected) exposure to covid-19: Secondary | ICD-10-CM | POA: Insufficient documentation

## 2021-09-05 DIAGNOSIS — Z87891 Personal history of nicotine dependence: Secondary | ICD-10-CM | POA: Diagnosis not present

## 2021-09-05 DIAGNOSIS — R0902 Hypoxemia: Secondary | ICD-10-CM | POA: Diagnosis not present

## 2021-09-05 DIAGNOSIS — S069X9A Unspecified intracranial injury with loss of consciousness of unspecified duration, initial encounter: Secondary | ICD-10-CM | POA: Diagnosis present

## 2021-09-05 DIAGNOSIS — Y92512 Supermarket, store or market as the place of occurrence of the external cause: Secondary | ICD-10-CM | POA: Insufficient documentation

## 2021-09-05 DIAGNOSIS — I499 Cardiac arrhythmia, unspecified: Secondary | ICD-10-CM | POA: Diagnosis not present

## 2021-09-05 DIAGNOSIS — R6889 Other general symptoms and signs: Secondary | ICD-10-CM | POA: Diagnosis not present

## 2021-09-05 LAB — CBC WITH DIFFERENTIAL/PLATELET
Abs Immature Granulocytes: 0.01 10*3/uL (ref 0.00–0.07)
Basophils Absolute: 0.1 10*3/uL (ref 0.0–0.1)
Basophils Relative: 1 %
Eosinophils Absolute: 0.3 10*3/uL (ref 0.0–0.5)
Eosinophils Relative: 5 %
HCT: 38.4 % (ref 36.0–46.0)
Hemoglobin: 12.2 g/dL (ref 12.0–15.0)
Immature Granulocytes: 0 %
Lymphocytes Relative: 35 %
Lymphs Abs: 2.1 10*3/uL (ref 0.7–4.0)
MCH: 28.4 pg (ref 26.0–34.0)
MCHC: 31.8 g/dL (ref 30.0–36.0)
MCV: 89.5 fL (ref 80.0–100.0)
Monocytes Absolute: 0.6 10*3/uL (ref 0.1–1.0)
Monocytes Relative: 11 %
Neutro Abs: 2.9 10*3/uL (ref 1.7–7.7)
Neutrophils Relative %: 48 %
Platelets: 390 10*3/uL (ref 150–400)
RBC: 4.29 MIL/uL (ref 3.87–5.11)
RDW: 14 % (ref 11.5–15.5)
WBC: 6 10*3/uL (ref 4.0–10.5)
nRBC: 0 % (ref 0.0–0.2)

## 2021-09-05 LAB — COMPREHENSIVE METABOLIC PANEL
ALT: 18 U/L (ref 0–44)
AST: 23 U/L (ref 15–41)
Albumin: 3.9 g/dL (ref 3.5–5.0)
Alkaline Phosphatase: 70 U/L (ref 38–126)
Anion gap: 12 (ref 5–15)
BUN: 44 mg/dL — ABNORMAL HIGH (ref 8–23)
CO2: 27 mmol/L (ref 22–32)
Calcium: 10 mg/dL (ref 8.9–10.3)
Chloride: 101 mmol/L (ref 98–111)
Creatinine, Ser: 1.3 mg/dL — ABNORMAL HIGH (ref 0.44–1.00)
GFR, Estimated: 46 mL/min — ABNORMAL LOW (ref >60)
Glucose, Bld: 94 mg/dL (ref 70–99)
Potassium: 3.7 mmol/L (ref 3.5–5.1)
Sodium: 140 mmol/L (ref 135–145)
Total Bilirubin: 0.4 mg/dL (ref 0.3–1.2)
Total Protein: 7.2 g/dL (ref 6.5–8.1)

## 2021-09-05 LAB — CBG MONITORING, ED: Glucose-Capillary: 100 mg/dL — ABNORMAL HIGH (ref 70–99)

## 2021-09-05 LAB — URINALYSIS, ROUTINE W REFLEX MICROSCOPIC
Bacteria, UA: NONE SEEN
Bilirubin Urine: NEGATIVE
Glucose, UA: NEGATIVE mg/dL
Ketones, ur: 20 mg/dL — AB
Nitrite: NEGATIVE
Protein, ur: NEGATIVE mg/dL
Specific Gravity, Urine: 1.018 (ref 1.005–1.030)
pH: 5 (ref 5.0–8.0)

## 2021-09-05 LAB — RESP PANEL BY RT-PCR (FLU A&B, COVID) ARPGX2
Influenza A by PCR: NEGATIVE
Influenza B by PCR: NEGATIVE
SARS Coronavirus 2 by RT PCR: NEGATIVE

## 2021-09-05 LAB — LIPASE, BLOOD: Lipase: 37 U/L (ref 11–51)

## 2021-09-05 MED ORDER — SODIUM CHLORIDE 0.9 % IV SOLN: INTRAVENOUS | Status: DC

## 2021-09-05 MED ORDER — SODIUM CHLORIDE 0.9% FLUSH: 3.0000 mL | Freq: Two times a day (BID) | INTRAVENOUS | Status: DC

## 2021-09-05 MED ORDER — ENOXAPARIN SODIUM 40 MG/0.4ML IJ SOSY
40.0000 mg | PREFILLED_SYRINGE | INTRAMUSCULAR | Status: DC
  Administered 2021-09-05: 40 mg via SUBCUTANEOUS
  Filled 2021-09-05: qty 0.4

## 2021-09-05 MED ORDER — ACETAMINOPHEN 500 MG PO TABS
1000.0000 mg | ORAL_TABLET | Freq: Once | ORAL | Status: AC
  Administered 2021-09-05: 1000 mg via ORAL
  Filled 2021-09-05: qty 2

## 2021-09-05 MED ORDER — LACTATED RINGERS IV SOLN: INTRAVENOUS | Status: AC

## 2021-09-05 NOTE — ED Triage Notes (Signed)
Pt BIBA from phone store. Per EMS pt reports that they suddenly felt extremely weak and sat down on chair walker, family stated they had multiple syncopal episodes. No falls.   Pt had gastric bypass on 08/16/21. Pt stopped Lovenox yesterday.  Aox4 400 ml NS en route  BP: 118/80 SPO2: 95 RA HR: 86 CBG: 178

## 2021-09-05 NOTE — ED Provider Notes (Signed)
[facility]-EMERGENCY DEPT Provider Note   CSN: [id number] Arrival date & time: 09/05/21  1539     History  Chief Complaint  Patient presents with   Loss of Consciousness    Multiple episodes    [name] is a 66 y.o. female.  HPI Patient presents less than 1 month after gastric sleeve procedure now with diffuse weakness, lightheadedness.  Patient is unsure if she lost consciousness, but per bystander the patient had episodes of loss of consciousness without falling.  EMS reports the patient was alert, oriented x4 in route, had essentially unremarkable vital signs, was mildly hyperglycemic.  The patient states that she was recovering generally well she believes, has no abdominal pain, nor did she have any earlier in the day.  She is unclear about possible precipitants earlier, and as above, is unsure of whether she actually lost consciousness or not, but denies falling, head pain, neck pain, or focal weakness in any extremity.    Home Medications Prior to Admission medications   Medication Sig Start Date End Date Taking? Authorizing Provider  albuterol (VENTOLIN HFA) 108 (90 Base) MCG/ACT inhaler Inhale 1-2 puffs into the lungs every 6 (six) hours as needed for wheezing or shortness of breath. 04/28/20   [name], [name], MD  budesonide-formoterol [facility]) 160-4.5 MCG/ACT inhaler Inhale 2 puffs into the lungs 2 (two) times daily. Patient taking differently: Inhale 2 puffs into the lungs daily as needed (Wheezing and Asthma). 07/21/20   [name], [name], MD  cholecalciferol (VITAMIN D3) 25 MCG (1000 UNIT) tablet Take 1,000 Units by mouth daily.    [provider]  diclofenac (VOLTAREN) 75 MG EC tablet Take 1 tablet (75 mg total) by mouth 2 (two) times daily as needed. 08/13/21   [name], [name], MD  enoxaparin (LOVENOX) 60 MG/0.6ML injection Inject 0.6 mLs (60 mg total) into the skin every 12 (twelve) hours for 14 days. 08/17/21 08/31/21  Stechschulte, Hyman Hopes, MD  gabapentin (NEURONTIN) 100 MG capsule Take 2 capsules (200 mg total) by mouth every 12 (twelve) hours. 08/16/21   Stechschulte, Hyman Hopes, MD  hydrochlorothiazide (HYDRODIURIL) 25 MG tablet Take 1 tablet (25 mg total) by mouth daily. 10/12/20   [name], [name], MD  HYDROcodone-acetaminophen (NORCO/VICODIN) 5-325 MG tablet Take 1 tablet by mouth every 6 (six) hours as needed for moderate pain.    [provider]  lisinopril (ZESTRIL) 40 MG tablet TAKE 1 TABLET BY MOUTH  DAILY 03/31/21   [name], [name], MD  ondansetron (ZOFRAN-ODT) 4 MG disintegrating tablet Take 1 tablet (4 mg total) by mouth every 6 (six) hours as needed for nausea or vomiting. 08/16/21   Stechschulte, Hyman Hopes, MD  oxyCODONE (OXY IR/ROXICODONE) 5 MG immediate release tablet Take 1 tablet (5 mg total) by mouth every 6 (six) hours as needed for severe pain. 08/16/21   Stechschulte, Hyman Hopes, MD  pantoprazole (PROTONIX) 40 MG tablet Take 1 tablet (40 mg total) by mouth daily. 08/16/21   Stechschulte, Hyman Hopes, MD  rosuvastatin (CRESTOR) 10 MG tablet Take 1 tablet (10 mg total) by mouth daily. Patient taking differently: Take 5 mg by mouth daily. 11/02/20   Parke Poisson, MD  tiZANidine (ZANAFLEX) 4 MG tablet Take 1 tablet (4 mg total) by mouth every 12 (twelve) hours as needed for muscle spasms. 06/14/21   [name], [name], MD  trolamine salicylate (ASPERCREME) 10 % cream Apply 1 application topically as needed for muscle pain.    [provider]      Allergies    Ketorolac tromethamine, Atrovent [ipratropium], Latex, and Tape    Review of Systems   Review of Systems  Constitutional:        Per HPI, otherwise negative  HENT:         Per HPI, otherwise negative  Respiratory:         Per HPI, otherwise negative  Cardiovascular:        Per HPI, otherwise negative  Gastrointestinal:  Negative for vomiting.  Endocrine:       Negative aside from HPI  Genitourinary:        Neg aside from HPI   Musculoskeletal:         Per HPI, otherwise negative  Skin: Negative.   Neurological:  Positive for syncope (Possible) and light-headedness.   Physical Exam Updated Vital Signs BP 126/60    Pulse 85    Temp 97.9 F (36.6 C) (Oral)    Resp 16    LMP 09/09/2009    SpO2 96%  Physical Exam Vitals and nursing note reviewed.  Constitutional:      General: She is not in acute distress.    Appearance: She is well-developed. She is obese.  HENT:     Head: Normocephalic and atraumatic.  Eyes:     Conjunctiva/sclera: Conjunctivae normal.  Cardiovascular:     Rate and Rhythm: Normal rate and regular rhythm.  Pulmonary:     Effort: Pulmonary effort is normal. No respiratory distress.     Breath sounds: Normal breath sounds. No stridor.  Abdominal:     General: There is no distension.     Tenderness: There is no abdominal tenderness. There is no guarding.     Comments: Protuberant, but not tender abdomen.  Multiple surgical sites unremarkable, nontender, no drainage  Skin:    General: Skin is warm and dry.  Neurological:     Mental Status: She is alert and oriented to person, place, and time.     Cranial Nerves: No cranial nerve deficit.  Psychiatric:        Mood and Affect: Mood normal.    ED Results / Procedures / Treatments   Labs (all labs ordered are listed, but only abnormal results are displayed) Labs Reviewed  COMPREHENSIVE METABOLIC PANEL - Abnormal; Notable for the following components:      Result Value   BUN 44 (*)    Creatinine, Ser 1.30 (*)    GFR, Estimated 46 (*)    All other components within normal limits  URINALYSIS, ROUTINE W REFLEX MICROSCOPIC - Abnormal; Notable for the following components:   Hgb urine dipstick SMALL (*)    Ketones, ur 20 (*)    Leukocytes,Ua LARGE (*)    All other components within normal limits  CBG MONITORING, ED - Abnormal; Notable for the following components:   Glucose-Capillary 100 (*)    All other components within normal limits  RESP PANEL BY RT-PCR (FLU  A&B, COVID) ARPGX2  CBC WITH DIFFERENTIAL/PLATELET  LIPASE, BLOOD    EKG EKG Interpretation  Date/Time:  Sunday September 05 2021 16:22:56 EST Ventricular Rate:  87 PR Interval:  171 QRS Duration: 88 QT Interval:  375 QTC Calculation: 452 R Axis:   50 Text Interpretation: Sinus rhythm Baseline wander Artifact Abnormal ECG Confirmed by Gerhard Munch [phone]) on 09/05/2021 5:40:21 PM  Radiology DG Chest Port 1 View  Result Date: 09/05/2021 CLINICAL DATA:  Sudden onset of weakness today. EXAM: PORTABLE CHEST 1 VIEW  COMPARISON:  08/06/2020 and older studies. FINDINGS: Cardiac silhouette normal in size.  No mediastinal or hilar masses. Clear lungs.  No pleural effusion or pneumothorax. Skeletal structures are grossly intact. IMPRESSION: No active disease. Electronically Signed   By: Amie Portland M.D.   On: 09/05/2021 17:07    Procedures Procedures    Medications Ordered in ED Medications  0.9 %  sodium chloride infusion ( Intravenous New Bag/Given 09/05/21 1845)  acetaminophen (TYLENOL) tablet 1,000 mg (1,000 mg Oral Given 09/05/21 1843)    ED Course/ Medical Decision Making/ A&P  This patient presents to the ED for concern of possible syncope in the context of recent gastric sleeve procedure, with ongoing weakness, this involves an extensive number of treatment options, and is a complaint that carries with it a high risk of complications and morbidity.  The differential diagnosis includes arrhythmia, dehydration, electrolyte abnormalities, infection   Co morbidities that complicate the patient evaluation  Obesity, hypertension, diabetes   Social Determinants of Health:  Obesity, hypertension, diabetes   Additional history obtained:  Additional history and/or information obtained from husband and eventually joined the patient, and chart review as well as EMS notes External records from outside source obtained and reviewed including EMS notes as above, husband notes the  patient has been weaker than usual in the past day or so   After the initial evaluation, orders, including: Labs, fluids, x-ray, EKG were initiated.  Patient placed on Cardiac and Pulse-Oximetry Monitors. The patient was maintained on a cardiac monitor.  The cardiac monitored showed an rhythm of sinus rhythm, 80, The patient was also maintained on pulse oximetry. The readings were typically 98% room air normal  On repeat evaluation of the patient improved  Lab Tests:  I personally interpreted labs.  The pertinent results include: Evidence for acute kidney injury, with creatinine almost 50% greater than that of 2 weeks ago, ketones in the urine, most consistent with concern for dehydration, possibly contributing to the episode of syncope versus near syncope  Imaging Studies ordered:  I independently visualized and interpreted imaging which showed unremarkable x-ray I agree with the radiologist interpretation  Consultations Obtained:  I requested consultation with the general medicine,  and discussed lab and imaging findings as well as pertinent plan - they recommend: Admission  Dispostion / Final MDM:  After consideration of the diagnostic results and the patient's response to treatment, she will require admission for further monitoring, management, fluid resuscitation.  This obese adult female now 2 weeks after gastric sleeve procedure presents after episodes of near syncope versus syncope, with ongoing weakness.  Absence of chest pain before or after somewhat reassuring, low suspicion for atypical ACS.  No EKG here evidence of arrhythmia, nor any on monitoring for several hours in the emergency department, though this remains a possibility of the events occurred prior to arrival. Labs notable for acute kidney injury.  Given the commendation of acute kidney injury, syncope versus near syncope she will be admitted for observation for monitoring, management, ongoing fluid  resuscitation.  Final Clinical Impression(s) / ED Diagnoses Final diagnoses:  Syncope and collapse  AKI (acute kidney injury) (HCC)        Gerhard Munch, MD 09/05/21 2000

## 2021-09-05 NOTE — H&P (Signed)
History and Physical    Patient: [name] JXB:[id number] DOB: 07/29/1955 DOA: 09/05/2021 DOS: the patient was seen and examined on 09/05/2021 PCP: [name], [name], MD  Patient coming from: Home  Chief Complaint:  Chief Complaint  Patient presents with   Loss of Consciousness    Multiple episodes    HPI: [name] is a 66 y.o. female with medical history significant of recent sleeve gastrectomy for morbid obesity, hypertension, asthma, hyperlipidemia, OSA who presents with concerns of weakness and syncope.  Patient normally ambulates with a walker due to left hip osteoarthritis.  She was walking in a store today when she felt very dizzy and lightheaded.  Daughter caught her before her fall.  Daughter noted her eyes rolled back and she was going in and out of consciousness. She has been noticing dizziness spells intermittently throughout this past week.  At times she would have sudden light sensitivity, hyperventilate since she could not breathe and felt dizzy. Patient recently underwent sleeve gastrectomy on 08/16/2021 for morbid obesity.  Has been eating mostly protein although less than before surgery.  Has been drinking and urinating less.  Had coffee this morning.  Has been compliant with 2 weeks of Lovenox for DVT prophylaxis with last dose yesterday.  Denies any lower extremity pain or edema.  No chest pain or palpitation.  In the ED, she was afebrile and normotensive on room air.  No leukocytosis.  Hemoglobin is normal at 12.2.  No significant electrolyte abnormalities other than notable AKI with creatinine of 1.3 from a prior of 0.89.  Anion gap is also trended up to 12 from 6.  EKG on my review showing normal axis, low voltage with normal sinus rhythm with wandering baseline  Chest x-ray was negative UA shows large leuk, negative nitrite, positive hyaline casts and mucus  She was given normal saline bolus and hospitalist was called for admission for work-up of  syncope.  Review of Systems: As mentioned in the history of present illness. All other systems reviewed and are negative. Past Medical History:  Diagnosis Date   Abnormal EKG 06/2003   History of inverted T waves V1-V3. Normal 2D echo (07/23/2003): LVEF 65%.   Anemia    BL Hgb 11-12. Ferritin 14 - low normal (08/2007). Colonoscopy 2009 - external hemorrhoids (excellent prep). Last anemia panel (12/2010) - Iron  24, TIBC 269,  B12  316, Folate 11.9, Ferritin 73.   Asthma    B12 deficiency    Back pain    Chest pain    Degenerative joint disease    BL knees (L>R), lumbar spine. Followed by Sports Medicine, Dr. Jennette Kettle.   Dyspnea    History of multiple pulmonary nodules    Incidental finding: CT Abd/ Pelvis (04/2010) - Several small lower lobe lung nodules, including one pure ground-glass pulmonary nodule measuring 8 mm in the left lower lobe.  Recommend follow-up chest CT (IV contrast preferred) in 6 months to document stability. //  CT Abd/ Pelvis (07/2010) -  3 mm RLL and  8 mm LLL nodule stable.  Other nodules unchanged, likely benign.   Hyperlipidemia    Hypertension    Incarcerated ventral hernia 04/2010   Noted on CT Abd/ pelvis (04/2010). Patient now s/p ventral hernia repair by Dr. Gerrit Friends (12/2010)   Insomnia    Knee osteoarthritis    s/p Left total knee replacement (06/2011)   Left hip pain    Left ventricular diastolic dysfunction    Leg edema  Lower extremity edema    Chronic. 2D echo (2005) - EF 65%.   Obesity    OSA (obstructive sleep apnea)    Palpitations    Positive D dimer    Pre-diabetes    Right knee pain    Past Surgical History:  Procedure Laterality Date   BIOPSY  04/23/2021   Procedure: BIOPSY;  Surgeon: Quentin Ore, MD;  Location: [facility];  Service: General;;   COLONOSCOPY  2009   COLONOSCOPY WITH PROPOFOL N/A 12/18/2017   Procedure: COLONOSCOPY WITH PROPOFOL;  Surgeon: Iva Boop, MD;  Location: [facility];  Service: Endoscopy;   Laterality: N/A;   ESOPHAGOGASTRODUODENOSCOPY N/A 04/23/2021   Procedure: ESOPHAGOGASTRODUODENOSCOPY (EGD);  Surgeon: Quentin Ore, MD;  Location: [facility];  Service: General;  Laterality: N/A;   FOOT SURGERY  2004    left   INCISION AND DRAINAGE ABSCESS ANAL  07/2008   I&D and debridgement of anorectal abscess   INCISIONAL HERNIA REPAIR  12/2010   Repair of incarcerated ventral incisional hernia with Ethicon mesh patch - performed by Dr. Gerrit Friends.    INGUINAL HERNIA REPAIR     JOINT REPLACEMENT Left 2013   left knee   TOTAL KNEE ARTHROPLASTY  06/16/2011   Left TOTAL KNEE ARTHROPLASTY;  Surgeon: Kennieth Rad;  Location: [facility] OR;  Service: Orthopedics;  Laterality: Left;  left total knee arthroplasty   TUBAL LIGATION     UPPER GI ENDOSCOPY N/A 08/16/2021   Procedure: UPPER GI ENDOSCOPY;  Surgeon: Quentin Ore, MD;  Location: [facility] ORS;  Service: General;  Laterality: N/A;   Social History:  reports that she quit smoking about 23 years ago. Her smoking use included cigarettes. She has a 10.00 pack-year smoking history. She has never used smokeless tobacco. She reports that she does not drink alcohol and does not use drugs.  Allergies  Allergen Reactions   Ketorolac Tromethamine Shortness Of Breath and Palpitations   Atrovent [Ipratropium] Hives and Rash   Latex Rash and Other (See Comments)    NO POWDERED GLOVES!!!!   Tape Dermatitis    Irritates skin     Family History  Problem Relation Age of Onset   Diabetes Mother    Hypertension Mother    Stroke Mother    Hyperlipidemia Mother    Heart disease Mother    Sudden death Mother    Kidney disease Mother    Depression Mother    Dementia Father    Heart disease Father    Hyperlipidemia Father    Sudden death Father    Depression Father    Hypertension Sister    Diabetes Brother    Hypertension Brother    Rashes / Skin problems Maternal Grandfather    Heart attack Neg Hx     Prior to Admission medications    Medication Sig Start Date End Date Taking? Authorizing Provider  albuterol (VENTOLIN HFA) 108 (90 Base) MCG/ACT inhaler Inhale 1-2 puffs into the lungs every 6 (six) hours as needed for wheezing or shortness of breath. 04/28/20   [name], [name], MD  budesonide-formoterol [facility]) 160-4.5 MCG/ACT inhaler Inhale 2 puffs into the lungs 2 (two) times daily. Patient taking differently: Inhale 2 puffs into the lungs daily as needed (Wheezing and Asthma). 07/21/20   [name], [name], MD  cholecalciferol (VITAMIN D3) 25 MCG (1000 UNIT) tablet Take 1,000 Units by mouth daily.    [provider]  diclofenac (VOLTAREN) 75 MG EC tablet Take 1 tablet (75 mg total)  by mouth 2 (two) times daily as needed. 08/13/21   [place], [name], MD  enoxaparin (LOVENOX) 60 MG/0.6ML injection Inject 0.6 mLs (60 mg total) into the skin every 12 (twelve) hours for 14 days. 08/17/21 08/31/21  Stechschulte, Hyman HopesPaul J, MD  gabapentin (NEURONTIN) 100 MG capsule Take 2 capsules (200 mg total) by mouth every 12 (twelve) hours. 08/16/21   Stechschulte, Hyman HopesPaul J, MD  hydrochlorothiazide (HYDRODIURIL) 25 MG tablet Take 1 tablet (25 mg total) by mouth daily. 10/12/20   [place], [name], MD  HYDROcodone-acetaminophen (NORCO/VICODIN) 5-325 MG tablet Take 1 tablet by mouth every 6 (six) hours as needed for moderate pain.    [provider]  lisinopril (ZESTRIL) 40 MG tablet TAKE 1 TABLET BY MOUTH  DAILY 03/31/21   [place], [name], MD  ondansetron (ZOFRAN-ODT) 4 MG disintegrating tablet Take 1 tablet (4 mg total) by mouth every 6 (six) hours as needed for nausea or vomiting. 08/16/21   Stechschulte, Hyman HopesPaul J, MD  oxyCODONE (OXY IR/ROXICODONE) 5 MG immediate release tablet Take 1 tablet (5 mg total) by mouth every 6 (six) hours as needed for severe pain. 08/16/21   Stechschulte, Hyman HopesPaul J, MD  pantoprazole (PROTONIX) 40 MG tablet Take 1 tablet (40 mg total) by mouth daily. 08/16/21   Stechschulte, Hyman HopesPaul J, MD  rosuvastatin (CRESTOR) 10 MG tablet Take 1  tablet (10 mg total) by mouth daily. Patient taking differently: Take 5 mg by mouth daily. 11/02/20   Parke PoissonAcharya, Gayatri A, MD  tiZANidine (ZANAFLEX) 4 MG tablet Take 1 tablet (4 mg total) by mouth every 12 (twelve) hours as needed for muscle spasms. 06/14/21   [place], [name], MD  trolamine salicylate (ASPERCREME) 10 % cream Apply 1 application topically as needed for muscle pain.    [provider]    Physical Exam: Vitals:   09/05/21 1800 09/05/21 1830 09/05/21 1900 09/05/21 1930  BP: (!) 138/47 136/68 (!) 139/34 126/60  Pulse: 91 89 92 85  Resp:  18 18 16   Temp:      TempSrc:      SpO2: 97% 99% 97% 96%   Constitutional: NAD, calm, comfortable morbidly obese female laying flat in bed Eyes: PERRL, lids and conjunctivae normal ENMT: Mucous membranes are moist.  Neck: normal, supple Respiratory: clear to auscultation bilaterally, no wheezing, no crackles. Normal respiratory effort. No accessory muscle use.  Cardiovascular: Regular rate and rhythm, no murmurs / rubs / gallops. No extremity edema. 2+ pedal pulses.  Abdomen: soft , non-distended with protuberance in epigastric area without tenderness, bowel sounds active Musculoskeletal: no clubbing / cyanosis. No joint deformity upper and lower extremities. Normal muscle tone.  Skin: healing surgical scars on abdomen Neurologic: CN 2-12 grossly intact. Sensation intact. Strength 4/5 in lower extremity Psychiatric: Normal judgment and insight. Alert and oriented x 3.  Anxious mood.  Data Reviewed:  See HPI  Assessment and Plan:  Syncope secondary to dehydration/AKI in the setting of recent sleeve gastrectomy -Recent sleeve gastrectomy on 08/16/2021 with drastic changes in her diet.  Presented with AKI with creatinine of 1.3 from prior of 0.89.  Also has increased metabolic acidosis with anion gap of 12 up from 6.  UA also with evidence for dehydration with positive hyaline casts. -Lower suspicion for pulmonary embolism since she  has been compliant with Lovenox DVT prophylaxis.  Also does not objectively have hypoxia or tachycardia. -Low concern for cardiac etiology with reassuring EKG.  Had recent stress test on 11/2020 with EF of 55 to  60% and normal low risk study. -Continuous IV fluid resuscitation overnight.  Follow creatinine in the morning. -PT evaluation for weakness  Metabolic acidosis -Secondary to renal insufficiency -Continuous IV fluids as above  Recent sleeve gastrectomy  on 2/6. No abdominal issues. Wounds healing well.   HTN Hold ACE-HCTZ due to AKI   HLD Continue statin when med rec is complete  OSA not on CPAP   Morbid obesity Status post recent sleeve gastrectomy BMI of 54     Advance Care Planning:   Code Status: Prior FULL  Consults: none  Family Communication: Discussed plan with patient and husband at bedside.  All questions and concerns were addressed  Severity of Illness: The appropriate patient status for this patient is OBSERVATION. Observation status is judged to be reasonable and necessary in order to provide the required intensity of service to ensure the patient's safety. The patient's presenting symptoms, physical exam findings, and initial radiographic and laboratory data in the context of their medical condition is felt to place them at decreased risk for further clinical deterioration. Furthermore, it is anticipated that the patient will be medically stable for discharge from the hospital within 2 midnights of admission.   Author: Anselm Jungling, DO 09/05/2021 8:08 PM  For on call review [email or web address].

## 2021-09-06 DIAGNOSIS — Z9884 Bariatric surgery status: Secondary | ICD-10-CM | POA: Diagnosis not present

## 2021-09-06 DIAGNOSIS — G4733 Obstructive sleep apnea (adult) (pediatric): Secondary | ICD-10-CM | POA: Diagnosis not present

## 2021-09-06 DIAGNOSIS — E876 Hypokalemia: Secondary | ICD-10-CM | POA: Diagnosis not present

## 2021-09-06 DIAGNOSIS — E785 Hyperlipidemia, unspecified: Secondary | ICD-10-CM | POA: Diagnosis not present

## 2021-09-06 DIAGNOSIS — N179 Acute kidney failure, unspecified: Secondary | ICD-10-CM | POA: Diagnosis not present

## 2021-09-06 DIAGNOSIS — R55 Syncope and collapse: Secondary | ICD-10-CM | POA: Diagnosis not present

## 2021-09-06 DIAGNOSIS — I10 Essential (primary) hypertension: Secondary | ICD-10-CM | POA: Diagnosis not present

## 2021-09-06 LAB — BASIC METABOLIC PANEL
Anion gap: 6 (ref 5–15)
BUN: 28 mg/dL — ABNORMAL HIGH (ref 8–23)
CO2: 28 mmol/L (ref 22–32)
Calcium: 9.2 mg/dL (ref 8.9–10.3)
Chloride: 105 mmol/L (ref 98–111)
Creatinine, Ser: 0.89 mg/dL (ref 0.44–1.00)
GFR, Estimated: >60 mL/min (ref >60)
Glucose, Bld: 87 mg/dL (ref 70–99)
Potassium: 2.8 mmol/L — ABNORMAL LOW (ref 3.5–5.1)
Sodium: 139 mmol/L (ref 135–145)

## 2021-09-06 LAB — GLUCOSE, CAPILLARY: Glucose-Capillary: 83 mg/dL (ref 70–99)

## 2021-09-06 MED ORDER — POTASSIUM CHLORIDE CRYS ER 20 MEQ PO TBCR
40.0000 meq | EXTENDED_RELEASE_TABLET | ORAL | Status: AC
  Administered 2021-09-06 (×2): 40 meq via ORAL
  Filled 2021-09-06 (×2): qty 2

## 2021-09-06 MED ORDER — MAALOX MAX 400-400-40 MG/5ML PO SUSP: 15.0000 mL | Freq: Four times a day (QID) | ORAL | 1 refills | Status: DC | PRN

## 2021-09-06 MED ORDER — POTASSIUM CHLORIDE CRYS ER 20 MEQ PO TBCR: 20.0000 meq | EXTENDED_RELEASE_TABLET | Freq: Two times a day (BID) | ORAL | 0 refills | Status: DC

## 2021-09-06 MED ORDER — ALUM & MAG HYDROXIDE-SIMETH 200-200-20 MG/5ML PO SUSP
30.0000 mL | ORAL | Status: DC | PRN
  Administered 2021-09-06: 30 mL via ORAL
  Filled 2021-09-06: qty 30

## 2021-09-06 NOTE — Discharge Summary (Addendum)
Physician Discharge Summary   Patient: [name] MRN: [id number] DOB: August 29, 1955  Admit date:     09/05/2021  Discharge date: 09/06/21  Discharge Physician: Glade Lloyd   PCP: [name], [name], MD   Recommendations at discharge:   Follow-up with PCP in a week with repeat BMP Outpatient follow-up with general surgery Recommend outpatient evaluation and follow-up by cardiology Follow-up in the ED if symptoms worsen or new.  Hospital Course:  66 y.o. female with medical history significant of recent sleeve gastrectomy for morbid obesity, hypertension, asthma, hyperlipidemia, OSA presented with syncope.  On presentation, creatinine was 1.3 from prior of 0.89.  She was started on IV fluids.  Chest x-ray was negative.  Subsequently, her kidney function has improved.  She is hypokalemic which is being replaced.  Antihypertensives are on hold.  She will be discharged home today with close outpatient follow-up with PCP.  Discharge diagnosis and assessment and Plan: * Syncope- (present on admission) - Possibly from dehydration/AKI -No further syncope since admission.  Treated with IV fluids. -Has tolerated PT. -Outpatient follow-up with PCP.  Might need cardiology evaluation as an outpatient.  AKI (acute kidney injury) (HCC)- (present on admission) - Presented with creatinine of 1.3 from prior of 0.89.  Treated with IV fluids. -creatinine improved. Outpatient follow-up  Hypokalemia - Replace and continue replacement upon discharge.  Outpatient follow-up of BMP  S/P laparoscopic sleeve gastrectomy -No abdominal issues. Outpatient follow up with general surgery  Morbid obesity (HCC)- (present on admission) -outpatient follow up  Obstructive sleep apnea- (present on admission) - Outpatient follow-up  Hyperlipidemia- (present on admission) - Continue statin.  Essential hypertension- (present on admission) - Blood pressure on the higher side but will hold antihypertensives on  discharge because of AKI.  Follow-up with PCP to see when these can be resumed.    Consultants: None Procedures performed: None Disposition: Home Diet recommendation:  Cardiac diet  DISCHARGE MEDICATION: Allergies as of 09/06/2021       Reactions   Ketorolac Tromethamine Shortness Of Breath, Palpitations   Atrovent [ipratropium] Hives, Rash   Latex Rash, Other (See Comments)   NO POWDERED GLOVES!!!!   Tape Dermatitis   Irritates skin         Medication List     STOP taking these medications    diclofenac 75 MG EC tablet Commonly known as: VOLTAREN   hydrochlorothiazide 25 MG tablet Commonly known as: HYDRODIURIL   HYDROcodone-acetaminophen 5-325 MG tablet Commonly known as: NORCO/VICODIN   lisinopril 40 MG tablet Commonly known as: ZESTRIL   oxyCODONE 5 MG immediate release tablet Commonly known as: Oxy IR/ROXICODONE   tiZANidine 4 MG tablet Commonly known as: Zanaflex       TAKE these medications    albuterol 108 (90 Base) MCG/ACT inhaler Commonly known as: VENTOLIN HFA Inhale 1-2 puffs into the lungs every 6 (six) hours as needed for wheezing or shortness of breath.   BARIATRIC MULTIVITAMINS/IRON PO Take 1 tablet by mouth daily.   budesonide-formoterol 160-4.5 MCG/ACT inhaler Commonly known as: SYMBICORT Inhale 2 puffs into the lungs 2 (two) times daily. What changed:  when to take this reasons to take this   calcium carbonate 1250 (500 Ca) MG tablet Commonly known as: OS-CAL - dosed in mg of elemental calcium Take 1 tablet by mouth 2 (two) times daily with a meal.   cholecalciferol 25 MCG (1000 UNIT) tablet Commonly known as: VITAMIN D3 Take 1,000 Units by mouth daily.   gabapentin 100 MG capsule Commonly  known as: NEURONTIN Take 2 capsules (200 mg total) by mouth every 12 (twelve) hours.   Maalox Max 400-400-40 MG/5ML suspension Generic drug: alum & mag hydroxide-simeth Take 15 mLs by mouth every 6 (six) hours as needed for  indigestion.   ondansetron 4 MG disintegrating tablet Commonly known as: ZOFRAN-ODT Take 1 tablet (4 mg total) by mouth every 6 (six) hours as needed for nausea or vomiting.   pantoprazole 40 MG tablet Commonly known as: PROTONIX Take 1 tablet (40 mg total) by mouth daily.   potassium chloride SA 20 MEQ tablet Commonly known as: KLOR-CON M Take 1 tablet (20 mEq total) by mouth 2 (two) times daily for 7 days.   rosuvastatin 10 MG tablet Commonly known as: CRESTOR Take 1 tablet (10 mg total) by mouth daily.   trolamine salicylate 10 % cream Commonly known as: ASPERCREME Apply 1 application topically as needed for muscle pain.         Subjective: Patient seen and examined at bedside.  No syncope since admission.  Feels okay to go home today.  No fever or vomiting reported.   Discharge Exam: Filed Weights   09/06/21 0500  Weight: (!) 148.1 kg   General: No acute distress, currently on room air ENT/neck: No elevated JVD.  No obvious masses  respiratory: Bilateral decreased breath sounds at bases with some scattered crackles CVS: S1-S2 heard, rate controlled Abdominal: Soft, morbidly obese, nontender, nondistended, no organomegaly, bowel sounds heard Extremities: No cyanosis, clubbing, edema CNS: Alert, awake and oriented.  No focal neurologic deficit.  Moving extremities. Lymph: No cervical lymphadenopathy Skin: No rashes, lesions, ulcers Psych: Normal mood, affect and judgment Musculoskeletal: No obvious joint deformity/tenderness/swelling   Condition at discharge: good  The results of significant diagnostics from this hospitalization (including imaging, microbiology, ancillary and laboratory) are listed below for reference.   Imaging Studies: DG Chest Port 1 View  Result Date: 09/05/2021 CLINICAL DATA:  Sudden onset of weakness today. EXAM: PORTABLE CHEST 1 VIEW COMPARISON:  08/06/2020 and older studies. FINDINGS: Cardiac silhouette normal in size.  No mediastinal  or hilar masses. Clear lungs.  No pleural effusion or pneumothorax. Skeletal structures are grossly intact. IMPRESSION: No active disease. Electronically Signed   By: Amie Portland M.D.   On: 09/05/2021 17:07    Microbiology: Results for orders placed or performed during the hospital encounter of 09/05/21  Resp Panel by RT-PCR (Flu A&B, Covid) Nasopharyngeal Swab     Status: None   Collection Time: 09/05/21  9:16 PM   Specimen: Nasopharyngeal Swab; Nasopharyngeal(NP) swabs in vial transport medium  Result Value Ref Range Status   SARS Coronavirus 2 by RT PCR NEGATIVE NEGATIVE Final    Comment: (NOTE) SARS-CoV-2 target nucleic acids are NOT DETECTED.  The SARS-CoV-2 RNA is generally detectable in upper respiratory specimens during the acute phase of infection. The lowest concentration of SARS-CoV-2 viral copies this assay can detect is 138 copies/mL. A negative result does not preclude SARS-Cov-2 infection and should not be used as the sole basis for treatment or other patient management decisions. A negative result may occur with  improper specimen collection/handling, submission of specimen other than nasopharyngeal swab, presence of viral mutation(s) within the areas targeted by this assay, and inadequate number of viral copies(<138 copies/mL). A negative result must be combined with clinical observations, patient history, and epidemiological information. The expected result is Negative.  Fact Sheet for Patients:  BloggerCourse.com  Fact Sheet for Healthcare Providers:  SeriousBroker.it  This test is no  t yet approved or cleared by the [place] and  has been authorized for detection and/or diagnosis of SARS-CoV-2 by FDA under an Emergency Use Authorization (EUA). This EUA will remain  in effect (meaning this test can be used) for the duration of the COVID-19 declaration under Section 564(b)(1) of the Act, 21 U.S.C.section  360bbb-3(b)(1), unless the authorization is terminated  or revoked sooner.       Influenza A by PCR NEGATIVE NEGATIVE Final   Influenza B by PCR NEGATIVE NEGATIVE Final    Comment: (NOTE) The Xpert Xpress SARS-CoV-2/FLU/RSV plus assay is intended as an aid in the diagnosis of influenza from Nasopharyngeal swab specimens and should not be used as a sole basis for treatment. Nasal washings and aspirates are unacceptable for Xpert Xpress SARS-CoV-2/FLU/RSV testing.  Fact Sheet for Patients: BloggerCourse.com  Fact Sheet for Healthcare Providers: SeriousBroker.it  This test is not yet approved or cleared by the [place] FDA and has been authorized for detection and/or diagnosis of SARS-CoV-2 by FDA under an Emergency Use Authorization (EUA). This EUA will remain in effect (meaning this test can be used) for the duration of the COVID-19 declaration under Section 564(b)(1) of the Act, 21 U.S.C. section 360bbb-3(b)(1), unless the authorization is terminated or revoked.  Performed at [facility], [address], [place], [place] [address]     Labs: CBC: Recent Labs  Lab 09/05/21 1630  WBC 6.0  NEUTROABS 2.9  HGB 12.2  HCT 38.4  MCV 89.5  PLT 390   Basic Metabolic Panel: Recent Labs  Lab 09/05/21 1630 09/06/21 0352  NA 140 139  K 3.7 2.8*  CL 101 105  CO2 27 28  GLUCOSE 94 87  BUN 44* 28*  CREATININE 1.30* 0.89  CALCIUM 10.0 9.2   Liver Function Tests: Recent Labs  Lab 09/05/21 1630  AST 23  ALT 18  ALKPHOS 70  BILITOT 0.4  PROT 7.2  ALBUMIN 3.9   CBG: Recent Labs  Lab 09/05/21 1627 09/06/21 0548  GLUCAP 100* 83    Discharge time spent: greater than 30 minutes.  Signed: Glade Lloyd, MD [facility] 09/06/2021

## 2021-09-06 NOTE — Progress Notes (Signed)
Pt discharged to home in stable condition accompanied by husband. AVS reviewed and all questions answered to patient's satisfaction - denies further concerns at this time. Will f/u with PCP as discussed.  Ardyth Gal, RN 09/06/2021

## 2021-09-06 NOTE — Assessment & Plan Note (Addendum)
-   Presented with creatinine of 1.3 from prior of 0.89.  Treated with IV fluids. -creatinine improved. Outpatient follow-up

## 2021-09-06 NOTE — Hospital Course (Signed)
66 y.o. female with medical history significant of recent sleeve gastrectomy for morbid obesity, hypertension, asthma, hyperlipidemia, OSA presented with syncope.  On presentation, creatinine was 1.3 from prior of 0.89.  She was started on IV fluids.  Chest x-ray was negative.  Subsequently, her kidney function has improved.  She is hypokalemic which is being replaced.  Antihypertensives are on hold.  She will be discharged home today with close outpatient follow-up with PCP.

## 2021-09-06 NOTE — Assessment & Plan Note (Signed)
-   Possibly from dehydration/AKI -No further syncope since admission.  Treated with IV fluids. -Has tolerated PT. -Outpatient follow-up with PCP.  Might need cardiology evaluation as an outpatient.

## 2021-09-06 NOTE — Progress Notes (Signed)
Discharge orders received for patient. As previously discussed with MD pt wishes to have lunch and attempt another walk prior to discharge. OK with MD. She will call her husband to alert him of discharge later today.  Coolidge Breeze, RN 09/06/2021

## 2021-09-06 NOTE — Assessment & Plan Note (Signed)
-  No abdominal issues. Outpatient follow up with general surgery

## 2021-09-06 NOTE — Assessment & Plan Note (Signed)
-   Blood pressure on the higher side but will hold antihypertensives on discharge because of AKI.  Follow-up with PCP to see when these can be resumed.

## 2021-09-06 NOTE — Evaluation (Signed)
Physical Therapy Evaluation Patient Details Name: [name] MRN: [id number] DOB: [birth year] Today's Date: 09/06/2021  History of Present Illness  Patient is 66 y.o. female presented after syncopal episode. Patient reports She has been noticing dizziness spells intermittently throughout this past week.  Pt is s/p lap gastric sleeve on 08/16/21 and reports since surgery at times she would have sudden light sensitivity, hyperventilate since she could not breathe and felt dizzy.  Pt admitted for AKI and metabolic acidosis. UA also with evidence for dehydration with positive hyaline casts.   Clinical Impression  [name] is 65 y.o. female admitted with above HPI and diagnosis. Patient is currently limited by functional impairments below (see PT problem list). Patient lives with her family and mobilizes with RW for household mobility at baseline and has wheelchair for longer distances out of home if needed. Patient limited this session by dizzy sensation and nausea with sit<>stand. Patient able to move bed >chair with min assist and RW after symptoms subsided in sitting. VSS throughout. Patient will benefit from continued skilled PT interventions to address impairments and progress independence with mobility, recommending HHPT and frequent assist from family. Acute PT will follow and progress as able.        Recommendations for follow up therapy are one component of a multi-disciplinary discharge planning process, led by the attending physician.  Recommendations may be updated based on patient status, additional functional criteria and insurance authorization.    09/06/21 0900  PT Visit Information  Last PT Received On 09/06/21  Assistance Needed +1  History of Present Illness Patient is 66 y.o. female presented after syncopal episode. Patient reports She has been noticing dizziness spells intermittently throughout this past week.  Pt is s/p lap gastric sleeve on 08/16/21 and reports since surgery  at times she would have sudden light sensitivity, hyperventilate since she could not breathe and felt dizzy.  Pt admitted for AKI and metabolic acidosis. UA also with evidence for dehydration with positive hyaline casts.  Precautions  Precautions Fall  Restrictions  Weight Bearing Restrictions No  Home Living  Family/patient expects to be discharged to: Private residence  Living Arrangements Spouse/significant other;Children  Available Help at Discharge Family  Type of [place] Two level;Able to live on main level with bedroom/bathroom;Full bath on main level  Bathroom Accessibility Yes  Home Metallurgist (2 wheels);Wheelchair - manual  Prior Function  Prior Level of Function  Needs assist  Mobility Comments pt uses RW for mobility and has assist for transportation.  ADLs Comments pt reports indpendence with bathing and dressing, does sink bath  Communication  Communication No difficulties  Pain Assessment  Pain Assessment No/denies pain  Cognition  Arousal/Alertness Awake/alert  Behavior During Therapy WFL for tasks assessed/performed  Overall Cognitive Status Within Functional Limits for tasks assessed  Upper Extremity Assessment  Upper Extremity Assessment Overall WFL for tasks assessed  Lower Extremity Assessment  Lower Extremity Assessment Overall WFL for tasks assessed  Cervical / Trunk Assessment  Cervical / Trunk Assessment Other exceptions  Cervical / Trunk Exceptions habitus  Bed Mobility  Overal bed mobility Needs Assistance  Bed Mobility Supine to Sit  Supine to sit Min assist;HOB elevated  General bed mobility comments cues to use bed rail and pt taking extra time to raise trunk and scoot to EOB. c/o slight dizziness that resolved after ~20 seconds sitting EOB.  Transfers  Overall transfer level Needs assistance  Equipment used  Rolling walker (2 wheels)  Transfers Sit to/from Stand;Bed to  chair/wheelchair/BSC  Sit to Stand Min assist;Min guard  Bed to/from chair/wheelchair/BSC transfer type: Step pivot  Step pivot transfers Min assist  General transfer comment cues for hand placement on RW and min asisst to rise from slightly elevated EOB. pt c/o increased dizziness and nausea on initial stand. pt required min assist to take small steps to move to recliner.  Balance  Overall balance assessment Needs assistance  Sitting-balance support Feet supported  Sitting balance-Leahy Scale Good  Standing balance support Bilateral upper extremity supported;During functional activity  Standing balance-Leahy Scale Poor  PT - End of Session  Equipment Utilized During Treatment Gait belt  Activity Tolerance Patient tolerated treatment well;Other (comment) (limited by nausea)  Patient left with call bell/phone within reach;in chair;with chair alarm set  Nurse Communication Mobility status  PT Assessment  PT Recommendation/Assessment Patient needs continued PT services  PT Visit Diagnosis Muscle weakness (generalized) (M62.81);Difficulty in walking, not elsewhere classified (R26.2);Other symptoms and signs involving the nervous system (R29.898);Dizziness and giddiness (R42);Unsteadiness on feet (R26.81)  PT Problem List Decreased strength;Decreased activity tolerance;Decreased balance;Decreased mobility;Decreased knowledge of use of DME;Decreased safety awareness;Decreased knowledge of precautions  PT Plan  PT Frequency (ACUTE ONLY) Min 3X/week  PT Treatment/Interventions (ACUTE ONLY) Gait training;DME instruction;Stair training;Functional mobility training;Therapeutic activities;Therapeutic exercise;Balance training;Patient/family education;Neuromuscular re-education  AM-PAC PT "6 Clicks" Mobility Outcome Measure (Version 2)  Help needed turning from your back to your side while in a flat bed without using bedrails? 3  Help needed moving from lying on your back to sitting on the side of a  flat bed without using bedrails? 3  Help needed moving to and from a bed to a chair (including a wheelchair)? 3  Help needed standing up from a chair using your arms (e.g., wheelchair or bedside chair)? 3  Help needed to walk in hospital room? 2  Help needed climbing 3-5 steps with a railing?  1  6 Click Score 15  Consider Recommendation of Discharge To: CIR/SNF/LTACH  Progressive Mobility  What is the highest level of mobility based on the progressive mobility assessment? Level 3 (Stands with assist) - Balance while standing  and cannot march in place  Activity Transferred from bed to chair  PT Recommendation  Follow Up Recommendations Home health PT  Assistance recommended at discharge Frequent or constant Supervision/Assistance  Patient can return home with the following A little help with walking and/or transfers;A little help with bathing/dressing/bathroom;Assistance with cooking/housework;Direct supervision/assist for medications management;Direct supervision/assist for financial management;Assist for transportation;Help with stairs or ramp for entrance  Functional Status Assessment Patient has had a recent decline in their functional status and demonstrates the ability to make significant improvements in function in a reasonable and predictable amount of time.  PT equipment None recommended by PT  Individuals Consulted  Consulted and Agree with Results and Recommendations Patient  Acute Rehab PT Goals  Patient Stated Goal get better, not be dizzy  PT Goal Formulation With patient  Time For Goal Achievement 09/20/21  Potential to Achieve Goals Good  PT Time Calculation  PT Start Time (ACUTE ONLY) 0902  PT Stop Time (ACUTE ONLY) 0941  PT Time Calculation (min) (ACUTE ONLY) 39 min  PT General Charges  $$ ACUTE PT VISIT 1 Visit  PT Evaluation  $PT Eval Low Complexity 1 Low  PT Treatments  $Therapeutic Activity 23-37 mins  Written Expression  Dominant Hand Right    Renaldo Fiddler  PT, DPT Acute Rehabilitation  Services Office [phone] Pager [phone]    Jacques Navy 09/06/2021, 1:04 PM

## 2021-09-06 NOTE — Assessment & Plan Note (Signed)
Continue statin. 

## 2021-09-06 NOTE — Assessment & Plan Note (Signed)
-   Outpatient follow-up °

## 2021-09-06 NOTE — TOC Progression Note (Signed)
Transition of Care [facility]) - Progression Note    Patient Details  Name: [name] MRN: [id number] Date of Birth: 1956-06-25  [facility] CM/SW Contact  Geni Bers, RN Phone Number: 09/06/2021, 1:43 PM  Clinical Narrative:    Spoke with pt concerning Home Health. Pt states that she use [facility] and selected [facility] for HHPT. Referral given to in house rep.        Expected Discharge Plan and Services           Expected Discharge Date: 09/06/21                                     Social Determinants of Health (SDOH) Interventions    Readmission Risk Interventions No flowsheet data found.

## 2021-09-06 NOTE — Assessment & Plan Note (Signed)
-   Replace and continue replacement upon discharge.  Outpatient follow-up of BMP

## 2021-09-06 NOTE — Assessment & Plan Note (Signed)
-  outpatient follow up ?

## 2021-09-07 ENCOUNTER — Encounter: Payer: Self-pay | Admitting: Family Medicine

## 2021-09-07 ENCOUNTER — Telehealth: Payer: Self-pay

## 2021-09-07 NOTE — Telephone Encounter (Addendum)
Transition Care Management Unsuccessful Follow-up Telephone Call  Date of discharge and from where:  TCM DC [facility] 09-06-21 Dx: syncope  Attempts:  1st Attempt  Reason for unsuccessful TCM follow-up call:  Left voice message  Transition Care Management Follow-up Telephone Call Date of discharge and from where: TCM DC [facility] 09-06-21 Dx: syncope How have you been since you were released from the hospital? Doing better Any questions or concerns? No  Items Reviewed: Did the pt receive and understand the discharge instructions provided? Yes  Medications obtained and verified? Yes  Other? No  Any new allergies since your discharge? No  Dietary orders reviewed? Yes Do you have support at home? Yes   Home Care and Equipment/Supplies: Were home health services ordered? no If so, what is the name of the agency? na  Has the agency set up a time to come to the patient's home? not applicable Were any new equipment or medical supplies ordered?  No What is the name of the medical supply agency? na Were you able to get the supplies/equipment? no Do you have any questions related to the use of the equipment or supplies? No  Functional Questionnaire: (I = Independent and D = Dependent) ADLs: I  Bathing/Dressing- I  Meal Prep- I  Eating- I  Maintaining continence- I  Transferring/Ambulation- I  Managing Meds- I  Follow up appointments reviewed:  PCP Hospital f/u appt confirmed? Yes  [facility] [name]  [facility] Yes Dr Anne Fu 09-15-21 at 320pm Are transportation arrangements needed? No  If their condition worsens, is the pt aware to call PCP or go to the Emergency Dept.? Yes Was the patient provided with contact information for the PCP's office or ED? Yes Was to pt encouraged to call back with questions or concerns? Yes

## 2021-09-10 NOTE — Progress Notes (Signed)
HPI: HollyHolly Haney is a 66 y.o. female with hx of sleeve gastrectomy for morbid obesity, hypertension, asthma, hyperlipidemia, and OSA  who is here today to follow on recent hospitalization.  She was hospitalized from 09/05/21 to 09/06/21. TCM call on 09/07/21.  She presented to the ED after episode of syncope. Cr was 1.3 during initial evaluation. Chest x-ray was negative. Treated with IVF, renal function normalized. K+ replaced IV. Completed 7 days of KLOR 20 meq bid.  Dx'ed with AKI most likely due to dehydration. Syncopal episodes, it was recommended to follow with cardiologist. She had cardiac work up before bariatric procedure.  Nuclear stress test on 11/19/20: The left ventricular ejection fraction is normal (55-65%). Nuclear stress EF: 56%. The study is normal. This is a low risk study.  XAJ:[id number] medications were discontinued. She has not been checking BP. Negative for CP, DOE, or diaphoresis.  Lab Results  Component Value Date   CREATININE 0.89 09/06/2021   BUN 28 (H) 09/06/2021   NA 139 09/06/2021   K 2.8 (L) 09/06/2021   CL 105 09/06/2021   CO2 28 09/06/2021   Lab Results  Component Value Date   WBC 6.0 09/05/2021   HGB 12.2 09/05/2021   HCT 38.4 09/05/2021   MCV 89.5 09/05/2021   PLT 390 09/05/2021   For a few months she has had episodes of palpitations, hyperventilating,and feeling "jittery", and anxious. This happens about 1-2 times per week while she is resting in bed. OSA : She is not using her CPAP, because insurance issues. Sometimes she feels rested when she gets up.  S/P robotic sleeve gastrectomy on 08/16/21, next appt with surgeon 09/15/21. Last bowel movement 3 days ago.  B12 def: She has not had her B12 injections in a few months.  Lab Results  Component Value Date   VITAMINB12 111 (L) 09/29/2020   HLD: She is on Rosuvastatin 10 mg daily. Tolerating medications well.  Lab Results  Component Value Date   CHOL 188  10/27/2020   HDL 43 10/27/2020   LDLCALC 128 (H) 10/27/2020   TRIG 91 10/27/2020   CHOLHDL 4.4 10/27/2020   Vit D def, she is on daily multivitamin and Ca++ with Vit D.  Review of Systems  Constitutional:  Positive for appetite change. Negative for activity change and fever.  HENT:  Negative for mouth sores, nosebleeds and sore throat.   Eyes:  Negative for redness and visual disturbance.  Respiratory:  Negative for cough and wheezing.   Gastrointestinal:  Negative for abdominal pain, nausea and vomiting.       Negative for changes in bowel habits.  Genitourinary:  Negative for decreased urine volume, dysuria and hematuria.  Musculoskeletal:  Positive for arthralgias and gait problem.  Skin:  Negative for rash.  Neurological:  Negative for syncope, weakness and headaches.  Psychiatric/Behavioral:  Negative for confusion.   Rest see pertinent positives and negatives per HPI.  Current Outpatient Medications on File Prior to Visit  Medication Sig Dispense Refill   albuterol (VENTOLIN HFA) 108 (90 Base) MCG/ACT inhaler Inhale 1-2 puffs into the lungs every 6 (six) hours as needed for wheezing or shortness of breath. 3 each 0   alum & mag hydroxide-simeth (MAALOX MAX) 400-400-40 MG/5ML suspension Take 15 mLs by mouth every 6 (six) hours as needed for indigestion. 355 mL 1   budesonide-formoterol (SYMBICORT) 160-4.5 MCG/ACT inhaler Inhale 2 puffs into the lungs 2 (two) times daily. (Patient taking differently: Inhale 2 puffs into the lungs daily  as needed (Wheezing and Asthma).) 3 each 4   calcium carbonate (OS-CAL - DOSED IN MG OF ELEMENTAL CALCIUM) 1250 (500 Ca) MG tablet Take 1 tablet by mouth 2 (two) times daily with a meal.     cholecalciferol (VITAMIN D3) 25 MCG (1000 UNIT) tablet Take 1,000 Units by mouth daily.     gabapentin (NEURONTIN) 100 MG capsule Take 2 capsules (200 mg total) by mouth every 12 (twelve) hours. 20 capsule 0   Multiple Vitamins-Minerals (BARIATRIC  MULTIVITAMINS/IRON PO) Take 1 tablet by mouth daily.     ondansetron (ZOFRAN-ODT) 4 MG disintegrating tablet Take 1 tablet (4 mg total) by mouth every 6 (six) hours as needed for nausea or vomiting. 20 tablet 0   pantoprazole (PROTONIX) 40 MG tablet Take 1 tablet (40 mg total) by mouth daily. 90 tablet 0   potassium chloride SA (KLOR-CON M) 20 MEQ tablet Take 1 tablet (20 mEq total) by mouth 2 (two) times daily for 7 days. 14 tablet 0   rosuvastatin (CRESTOR) 10 MG tablet Take 1 tablet (10 mg total) by mouth daily. 30 tablet 3   trolamine salicylate (ASPERCREME) 10 % cream Apply 1 application topically as needed for muscle pain.     No current facility-administered medications on file prior to visit.    Past Medical History:  Diagnosis Date   Abnormal EKG 06/2003   History of inverted T waves V1-V3. Normal 2D echo (07/23/2003): LVEF 65%.   Anemia    BL Hgb 11-12. Ferritin 14 - low normal (08/2007). Colonoscopy 2009 - external hemorrhoids (excellent prep). Last anemia panel (12/2010) - Iron  24, TIBC 269,  B12  316, Folate 11.9, Ferritin 73.   Asthma    B12 deficiency    Back pain    Chest pain    Degenerative joint disease    BL knees (L>R), lumbar spine. Followed by Sports Medicine, Dr. Jennette KettleNeal.   Dyspnea    History of multiple pulmonary nodules    Incidental finding: CT Abd/ Pelvis (04/2010) - Several small lower lobe lung nodules, including one pure ground-glass pulmonary nodule measuring 8 mm in the left lower lobe.  Recommend follow-up chest CT (IV contrast preferred) in 6 months to document stability. //  CT Abd/ Pelvis (07/2010) -  3 mm RLL and  8 mm LLL nodule stable.  Other nodules unchanged, likely benign.   Hyperlipidemia    Hypertension    Incarcerated ventral hernia 04/2010   Noted on CT Abd/ pelvis (04/2010). Patient now s/p ventral hernia repair by Dr. Gerrit FriendsGerkin (12/2010)   Insomnia    Knee osteoarthritis    s/p Left total knee replacement (06/2011)   Left hip pain    Left  ventricular diastolic dysfunction    Leg edema    Lower extremity edema    Chronic. 2D echo (2005) - EF 65%.   Obesity    OSA (obstructive sleep apnea)    Palpitations    Positive D dimer    Pre-diabetes    Right knee pain    Allergies  Allergen Reactions   Ketorolac Tromethamine Shortness Of Breath and Palpitations   Atrovent [Ipratropium] Hives and Rash   Latex Rash and Other (See Comments)    NO POWDERED GLOVES!!!!   Tape Dermatitis    Irritates skin     Social History   Socioeconomic History   Marital status: Married    Spouse name: Gerarda FractionJeron   Number of children: 3   Years of education: Not on file  Highest education level: Some college, no degree  Occupational History   Occupation: Stay at home  Tobacco Use   Smoking status: Former    Packs/day: 0.50    Years: 20.00    Pack years: 10.00    Types: Cigarettes    Quit date: 09/10/1998    Years since quitting: 23.0   Smokeless tobacco: Never   Tobacco comments:    66 yo   Vaping Use   Vaping Use: Never used  Substance and Sexual Activity   Alcohol use: No    Alcohol/week: 0.0 standard drinks   Drug use: No   Sexual activity: Yes    Partners: Male  Other Topics Concern   Not on file  Social History Narrative   10/05/2018: Lives with husband, daughter, and 2 grandchildren in [place]. Lives on main level though mostly.   Has three children altogether, all local, supportive   Currently getting ramp installed in house d/t pt difficulty getting upstairs.      Social Determinants of Health   Financial Resource Strain: Low Risk    Difficulty of Paying Living Expenses: Not very hard  Food Insecurity: No Food Insecurity   Worried About Programme researcher, broadcasting/film/video in the Last Year: Never true   Barista in the Last Year: Never true  Transportation Needs: Unmet Transportation Needs   Lack of Transportation (Medical): No   Lack of Transportation (Non-Medical): Yes  Physical Activity: Unknown   Days of Exercise  per Week: Patient refused   Minutes of Exercise per Session: 10 min  Stress: No Stress Concern Present   Feeling of Stress : Only a little  Social Connections: Moderately Integrated   Frequency of Communication with Friends and Family: More than three times a week   Frequency of Social Gatherings with Friends and Family: Once a week   Attends Religious Services: More than 4 times per year   Active Member of [employer] or Organizations: No   Attends Banker Meetings: Never   Marital Status: Married    Vitals:   09/13/21 1020  BP: 136/70  Pulse: (!) 102  Resp: 16  SpO2: 99%   Body mass index is 54.96 kg/m.  Physical Exam Vitals and nursing note reviewed.  Constitutional:      General: She is not in acute distress.    Appearance: She is well-developed.  HENT:     Head: Normocephalic and atraumatic.     Mouth/Throat:     Mouth: Mucous membranes are moist.     Dentition: Has dentures.     Pharynx: Oropharynx is clear.  Eyes:     Conjunctiva/sclera: Conjunctivae normal.  Cardiovascular:     Rate and Rhythm: Regular rhythm. Tachycardia present.     Pulses:          Posterior tibial pulses are 2+ on the right side and 2+ on the left side.     Heart sounds: No murmur heard. Pulmonary:     Effort: Pulmonary effort is normal. No respiratory distress.     Breath sounds: Normal breath sounds.  Abdominal:     Palpations: Abdomen is soft. There is no hepatomegaly or mass.     Tenderness: There is no abdominal tenderness.  Lymphadenopathy:     Cervical: No cervical adenopathy.  Skin:    General: Skin is warm.     Findings: No erythema or rash.  Neurological:     General: No focal deficit present.     Mental  Status: She is alert and oriented to person, place, and time.     Cranial Nerves: No cranial nerve deficit.     Comments: Antalgic gait assisted with a walker.  Psychiatric:     Comments: Well groomed, good eye contact.   ASSESSMENT AND PLAN:  HollyNashiya was  seen today for hospitalization follow-up.  Diagnoses and all orders for this visit: Orders Placed This Encounter  Procedures   Basic metabolic panel   Vitamin B12   Lipid panel   VITAMIN D 25 Hydroxy (Vit-D Deficiency, Fractures)   Lab Results  Component Value Date   CHOL 161 09/13/2021   HDL 36.20 (L) 09/13/2021   LDLCALC 106 (H) 09/13/2021   TRIG 97.0 09/13/2021   CHOLHDL 4 09/13/2021   Lab Results  Component Value Date   VITAMINB12 >1504 (H) 09/13/2021   Lab Results  Component Value Date   CREATININE 0.77 09/13/2021   BUN 11 09/13/2021   NA 139 09/13/2021   K 3.9 09/13/2021   CL 105 09/13/2021   CO2 24 09/13/2021   Hypokalemia K+ rich diet recommended for now. Further recommendations according to BMP result.  Hyperlipidemia, unspecified hyperlipidemia type Continue Rosuvastatin 10 mg daily and low fat diet. Further recommendations according to FLP result.  Syncope, unspecified syncope type Thought to be caused by dehydration, has not had any after hospital discharge. Continue adequate hydration. Instructed about warning signs. She agrees on holding on cardio referral for now.  Essential hypertension BP adequately controled today. Continue non pharmacologic treatment. Recommend monitoring BP at home and to let me know if numbers start going up.  Vitamin D deficiency No changes in current management. Further recommendations according to 25 OH vot D results.  Morbid obesity (HCC) S/P bariatric procedure. Sh has an appt with surgeon on 09/15/21.  B12 deficiency After verbal consent B12 1000 mcg IM x 1 given today. Further recommendations according to B12 results.  Obstructive sleep apnea Not on CPAP. This problem could be causing episodes of hyperventilation and palpitations while in bed. Strongly recommend contacting her sleep apnea specialist to have her CPAP delivered. We discussed adverse effects of OSA when it is not adequately treated. Wt loss will  help.  Return in about 4 months (around 01/13/2022) for HTN.  Shakemia Madera G. [name], MD  [facility]. [place] office.

## 2021-09-13 ENCOUNTER — Telehealth: Payer: Self-pay | Admitting: Skilled Nursing Facility1

## 2021-09-13 ENCOUNTER — Encounter: Payer: Self-pay | Admitting: Family Medicine

## 2021-09-13 ENCOUNTER — Ambulatory Visit (INDEPENDENT_AMBULATORY_CARE_PROVIDER_SITE_OTHER): Payer: Medicare Other | Admitting: Family Medicine

## 2021-09-13 VITALS — BP 136/70 | HR 102 | Resp 16 | Ht 65.0 in | Wt 330.2 lb

## 2021-09-13 DIAGNOSIS — E785 Hyperlipidemia, unspecified: Secondary | ICD-10-CM | POA: Diagnosis not present

## 2021-09-13 DIAGNOSIS — E559 Vitamin D deficiency, unspecified: Secondary | ICD-10-CM | POA: Diagnosis not present

## 2021-09-13 DIAGNOSIS — G4733 Obstructive sleep apnea (adult) (pediatric): Secondary | ICD-10-CM

## 2021-09-13 DIAGNOSIS — E538 Deficiency of other specified B group vitamins: Secondary | ICD-10-CM | POA: Diagnosis not present

## 2021-09-13 DIAGNOSIS — E876 Hypokalemia: Secondary | ICD-10-CM

## 2021-09-13 DIAGNOSIS — R55 Syncope and collapse: Secondary | ICD-10-CM

## 2021-09-13 DIAGNOSIS — I10 Essential (primary) hypertension: Secondary | ICD-10-CM | POA: Diagnosis not present

## 2021-09-13 LAB — BASIC METABOLIC PANEL
BUN: 11 mg/dL (ref 6–23)
CO2: 24 mEq/L (ref 19–32)
Calcium: 10.4 mg/dL (ref 8.4–10.5)
Chloride: 105 mEq/L (ref 96–112)
Creatinine, Ser: 0.77 mg/dL (ref 0.40–1.20)
GFR: 81.49 mL/min (ref >60.00)
Glucose, Bld: 103 mg/dL — ABNORMAL HIGH (ref 70–99)
Potassium: 3.9 mEq/L (ref 3.5–5.1)
Sodium: 139 mEq/L (ref 135–145)

## 2021-09-13 LAB — LIPID PANEL
Cholesterol: 161 mg/dL (ref 0–200)
HDL: 36.2 mg/dL — ABNORMAL LOW (ref >39.00)
LDL Cholesterol: 106 mg/dL — ABNORMAL HIGH (ref 0–99)
NonHDL: 125.05
Total CHOL/HDL Ratio: 4
Triglycerides: 97 mg/dL (ref 0.0–149.0)
VLDL: 19.4 mg/dL (ref 0.0–40.0)

## 2021-09-13 LAB — VITAMIN D 25 HYDROXY (VIT D DEFICIENCY, FRACTURES): VITD: 27.67 ng/mL — ABNORMAL LOW (ref 30.00–100.00)

## 2021-09-13 LAB — VITAMIN B12: Vitamin B-12: >1504 pg/mL — ABNORMAL HIGH (ref 211–911)

## 2021-09-13 MED ORDER — CYANOCOBALAMIN 1000 MCG/ML IJ SOLN
1000.0000 ug | Freq: Once | INTRAMUSCULAR | Status: AC
  Administered 2021-09-13: 1000 ug via INTRAMUSCULAR

## 2021-09-13 NOTE — Assessment & Plan Note (Signed)
BP adequately controled today. ?Continue non pharmacologic treatment. ?Recommend monitoring BP at home and to let me know if numbers start going up. ?

## 2021-09-13 NOTE — Patient Instructions (Addendum)
A few things to remember from today's visit: ? ?Essential hypertension - Plan: Basic metabolic panel ? ?Hypokalemia - Plan: Basic metabolic panel ? ?B12 deficiency - Plan: Vitamin B12 ? ?Hyperlipidemia, unspecified hyperlipidemia type - Plan: Lipid panel ? ?If you need refills please call your pharmacy. ?Do not use My Chart to request refills or for acute issues that need immediate attention. ?  ?Contact Dr Janee Morn office to have CPAP supplies ordered. ?Monitor blood pressure at home, if persistently 140/90 or above we will need to resume medication. ? ?Please be sure medication list is accurate. ?If a new problem present, please set up appointment sooner than planned today. ? ? ? ? ? ? ? ?

## 2021-09-13 NOTE — Assessment & Plan Note (Signed)
S/P bariatric procedure. ?Sh has an appt with surgeon on 09/15/21. ?

## 2021-09-13 NOTE — Assessment & Plan Note (Signed)
No changes in current management. ?Further recommendations according to 25 OH vot D results. ?

## 2021-09-13 NOTE — Assessment & Plan Note (Signed)
After verbal consent B12 1000 mcg IM x 1 given today. ?Further recommendations according to B12 results. ?

## 2021-09-13 NOTE — Telephone Encounter (Signed)
Called to check in on pts status.  ? ? ?Pt states she is better. Pt states she was admitted to the [facility] for dehydration.  ? ?Pt states she is able to meet her needs better and is trying to push herself and her daughters are pushing her to drink every 3 minutes.  ? ?Pt states she is drinking 4 bottles of water per day. Pt states she just remembered she had crystal light to add to her water which has helped.  ? ?Pt states she does not know how many grams of protein she is getting in each day but is drinking 2 protein shakes per day.  ? ?Pt states she will have diarrhea with the protein shakes but otherwise one bowel movement per week.   ?Advised pt the use of the mirirlax or milk of magnesia if constipated.  ? ?

## 2021-09-14 ENCOUNTER — Telehealth: Payer: Self-pay | Admitting: Internal Medicine

## 2021-09-14 DIAGNOSIS — G4733 Obstructive sleep apnea (adult) (pediatric): Secondary | ICD-10-CM

## 2021-09-14 NOTE — Telephone Encounter (Signed)
Called patient and she states that she knows she is not eligible for a new machine until 04/2022, but she states that her current machine is not working and she needs Dr Maple Hudson to state to [facility] that she is in need of a new machine because her current one is broke.  ? ?Dr Maple Hudson please advise  ?

## 2021-09-14 NOTE — Telephone Encounter (Signed)
Reading message chain, unclear if she never got machine or it is broken.  ?Please order to DME [facility]- please service broken machine or advise on replacement. ?

## 2021-09-15 ENCOUNTER — Ambulatory Visit: Payer: Medicare Other | Admitting: Cardiology

## 2021-09-15 NOTE — Telephone Encounter (Signed)
Spoke to patient.  ?She stated that she received a machine back in 2018 and that machine is now broken. patient is not eligible for new machine until 04/2022. She stated that she spoke to [facility] yesterday and was told that a new order was needed since current machine is broken.  ?Order placed.  ?Nothing further needed.  ?

## 2021-09-16 MED ORDER — ROSUVASTATIN CALCIUM 10 MG PO TABS: 10.0000 mg | ORAL_TABLET | Freq: Every day | ORAL | 3 refills | Status: DC

## 2021-09-21 ENCOUNTER — Telehealth: Payer: Self-pay | Admitting: Internal Medicine

## 2021-09-21 NOTE — Telephone Encounter (Signed)
Calling because the patient is not eligible for a new machine until 04/12/22, the order placed was for a new machine and supplies.  [facility] needs the  face to face OV notes that states the benefits and usage of the patient using the machine. Put that the patient was using and benefiting from the before the malfuction took place   ?

## 2021-09-21 NOTE — Telephone Encounter (Signed)
Lm for Brad with [facility].  

## 2021-09-23 NOTE — Telephone Encounter (Signed)
I called [facility] and spoke with Brad. He states that the pt needs a face to face visit. Pt not seen since 2021. I called and scheduled her appt for 09/30/21 with Katie. Nothing further needed.  ?

## 2021-09-30 ENCOUNTER — Telehealth: Payer: Self-pay | Admitting: Skilled Nursing Facility1

## 2021-09-30 ENCOUNTER — Ambulatory Visit: Payer: Medicare Other | Admitting: Nurse Practitioner

## 2021-09-30 NOTE — Telephone Encounter (Signed)
Pt wonders if her next visit is one on one or class. ? ?Pt wonders when will she be increased nutritionally. Dietitian advised at her next visit we will advance her diet.  ?

## 2021-09-30 NOTE — Progress Notes (Unsigned)
@Patient  ID: [name], female    DOB: 1955-09-13, 66 y.o.   MRN: [id number]  No chief complaint on file.   Referring provider: [name], [name], MD  HPI: 66 year old female, former smoker (10 pack years) followed for OSA on CPAP and asthma. She is a patient of Dr. Roxy Cedar and last seen in office on 06/23/2020. Past medical history significant for morbid obesity, HTN, HLD, diastolic dysfunction, anemia, allergic rhinitis.   TEST/EVENTS:  01/19/2017 HST: AHI 8.9/hr, SpO2 low 92% 08/17/2018 office spirometry: mild restriction, possible mild obstruction. FVC 2 (75), FEV1 1.7 (79), ratio 1.83, FEF 25-75% 2.1 (101)  06/23/2020: OV with Dr. Maple Hudson. Maintained on Symbicort and Singulair. CPAP auto 5-15 -> no longer using in over a year. Reported that her face broke out with the full face mask she had. Lost 20 lb and hoping to qualify for hip replacement. Repeat sleep study ordered which showed AHI of 13.5, which is worse than previous. CPAP auto 4-15 with nasal pillow mask ordered. Pt did not qualify for new machine as she received her last in 2018 and wouldn't be able to get a new one until 04/2022.   09/30/2021: Today - overdue follow up   Allergies  Allergen Reactions   Ketorolac Tromethamine Shortness Of Breath and Palpitations   Atrovent [Ipratropium] Hives and Rash   Latex Rash and Other (See Comments)    NO POWDERED GLOVES!!!!   Tape Dermatitis    Irritates skin     Immunization History  Administered Date(s) Administered   Influenza Split 03/29/2012   Influenza,inj,Quad PF,6+ Mos 04/09/2013, 09/23/2014, 05/04/2015, 04/04/2016, 03/20/2017, 05/02/2018, 04/27/2020   Moderna Sars-Covid-2 Vaccination 05/07/2020, 06/07/2020   Pneumococcal Polysaccharide-23 03/29/2012, 04/04/2016   Tdap 10/14/2010    Past Medical History:  Diagnosis Date   Abnormal EKG 06/2003   History of inverted T waves V1-V3. Normal 2D echo (07/23/2003): LVEF 65%.   Anemia    BL Hgb 11-12. Ferritin 14 - low  normal (08/2007). Colonoscopy 2009 - external hemorrhoids (excellent prep). Last anemia panel (12/2010) - Iron  24, TIBC 269,  B12  316, Folate 11.9, Ferritin 73.   Asthma    B12 deficiency    Back pain    Chest pain    Degenerative joint disease    BL knees (L>R), lumbar spine. Followed by Sports Medicine, Dr. Jennette Kettle.   Dyspnea    History of multiple pulmonary nodules    Incidental finding: CT Abd/ Pelvis (04/2010) - Several small lower lobe lung nodules, including one pure ground-glass pulmonary nodule measuring 8 mm in the left lower lobe.  Recommend follow-up chest CT (IV contrast preferred) in 6 months to document stability. //  CT Abd/ Pelvis (07/2010) -  3 mm RLL and  8 mm LLL nodule stable.  Other nodules unchanged, likely benign.   Hyperlipidemia    Hypertension    Incarcerated ventral hernia 04/2010   Noted on CT Abd/ pelvis (04/2010). Patient now s/p ventral hernia repair by Dr. 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 2D echo (2005) - EF 65%.   Obesity    OSA (obstructive sleep apnea)    Palpitations    Positive D dimer    Pre-diabetes    Right knee pain  Tobacco History: Social History   Tobacco Use  Smoking Status Former   Packs/day: 0.50   Years: 20.00   Pack years: 10.00   Types: Cigarettes   Quit date: 09/10/1998   Years since quitting: 23.0  Smokeless Tobacco Never  Tobacco Comments   66 yo    Counseling given: Not Answered Tobacco comments: 66 yo    Outpatient Medications Prior to Visit  Medication Sig Dispense Refill   albuterol (VENTOLIN HFA) 108 (90 Base) MCG/ACT inhaler Inhale 1-2 puffs into the lungs every 6 (six) hours as needed for wheezing or shortness of breath. 3 each 0   alum & mag hydroxide-simeth (MAALOX MAX) 400-400-40 MG/5ML suspension Take 15 mLs by mouth every 6 (six)  hours as needed for indigestion. 355 mL 1   budesonide-formoterol (SYMBICORT) 160-4.5 MCG/ACT inhaler Inhale 2 puffs into the lungs 2 (two) times daily. (Patient taking differently: Inhale 2 puffs into the lungs daily as needed (Wheezing and Asthma).) 3 each 4   calcium carbonate (OS-CAL - DOSED IN MG OF ELEMENTAL CALCIUM) 1250 (500 Ca) MG tablet Take 1 tablet by mouth 2 (two) times daily with a meal.     cholecalciferol (VITAMIN D3) 25 MCG (1000 UNIT) tablet Take 1,000 Units by mouth daily.     gabapentin (NEURONTIN) 100 MG capsule Take 2 capsules (200 mg total) by mouth every 12 (twelve) hours. 20 capsule 0   Multiple Vitamins-Minerals (BARIATRIC MULTIVITAMINS/IRON PO) Take 1 tablet by mouth daily.     ondansetron (ZOFRAN-ODT) 4 MG disintegrating tablet Take 1 tablet (4 mg total) by mouth every 6 (six) hours as needed for nausea or vomiting. 20 tablet 0   pantoprazole (PROTONIX) 40 MG tablet Take 1 tablet (40 mg total) by mouth daily. 90 tablet 0   rosuvastatin (CRESTOR) 10 MG tablet Take 1 tablet (10 mg total) by mouth daily. 90 tablet 3   trolamine salicylate (ASPERCREME) 10 % cream Apply 1 application topically as needed for muscle pain.     No facility-administered medications prior to visit.     Review of Systems:   Constitutional: No weight loss or gain, night sweats, fevers, chills, fatigue, or lassitude. HEENT: No headaches, difficulty swallowing, tooth/dental problems, or sore throat. No sneezing, itching, ear ache, nasal congestion, or post nasal drip CV:  No chest pain, orthopnea, PND, swelling in lower extremities, anasarca, dizziness, palpitations, syncope Resp: No shortness of breath with exertion or at rest. No excess mucus or change in color of mucus. No productive or non-productive. No hemoptysis. No wheezing.  No chest wall deformity GI:  No heartburn, indigestion, abdominal pain, nausea, vomiting, diarrhea, change in bowel habits, loss of appetite, bloody stools.  GU: No  dysuria, change in color of urine, urgency or frequency.  No flank pain, no hematuria  Skin: No rash, lesions, ulcerations MSK:  No joint pain or swelling.  No decreased range of motion.  No back pain. Neuro: No dizziness or lightheadedness.  Psych: No depression or anxiety. Mood stable.     Physical Exam:  LMP 09/09/2009   GEN: Pleasant, interactive, well-nourished/chronically-ill appearing/acutely-ill appearing/poorly-nourished/morbidly obese; in no acute distress.****** HEENT:  Normocephalic and atraumatic. EACs patent bilaterally. TM pearly gray with present light reflex bilaterally. PERRLA. Sclera white. Nasal turbinates pink, moist and patent bilaterally. No rhinorrhea present. Oropharynx pink and moist, without exudate or edema. No lesions, ulcerations, or postnasal drip.  NECK:  Supple w/ fair ROM. No JVD present. Normal carotid impulses w/o bruits. Thyroid symmetrical with no goiter or nodules  palpated. No lymphadenopathy.   CV: RRR, no m/r/g, no peripheral edema. Pulses intact, +2 bilaterally. No cyanosis, pallor or clubbing. PULMONARY:  Unlabored, regular breathing. Clear bilaterally A&P w/o wheezes/rales/rhonchi. No accessory muscle use. No dullness to percussion. GI: BS present and normoactive. Soft, non-tender to palpation. No organomegaly or masses detected. No CVA tenderness. MSK: No erythema, warmth or tenderness. Cap refil <2 sec all extrem. No deformities or joint swelling noted.  Neuro: A/Ox3. No focal deficits noted.   Skin: Warm, no lesions or rashe Psych: Normal affect and behavior. Judgement and thought content appropriate.     Lab Results:  CBC    Component Value Date/Time   WBC 6.0 09/05/2021 1630   RBC 4.29 09/05/2021 1630   HGB 12.2 09/05/2021 1630   HGB 11.9 05/09/2018 1007   HCT 38.4 09/05/2021 1630   HCT 38.1 05/09/2018 1007   PLT 390 09/05/2021 1630   MCV 89.5 09/05/2021 1630   MCV 87 05/09/2018 1007   MCH 28.4 09/05/2021 1630   MCHC 31.8  09/05/2021 1630   RDW 14.0 09/05/2021 1630   RDW 13.6 05/09/2018 1007   LYMPHSABS 2.1 09/05/2021 1630   LYMPHSABS 1.7 05/09/2018 1007   MONOABS 0.6 09/05/2021 1630   EOSABS 0.3 09/05/2021 1630   EOSABS 0.3 05/09/2018 1007   BASOSABS 0.1 09/05/2021 1630   BASOSABS 0.1 05/09/2018 1007    BMET    Component Value Date/Time   NA 139 09/13/2021 1108   NA 141 10/27/2020 1547   K 3.9 09/13/2021 1108   CL 105 09/13/2021 1108   CO2 24 09/13/2021 1108   GLUCOSE 103 (H) 09/13/2021 1108   BUN 11 09/13/2021 1108   BUN 14 10/27/2020 1547   CREATININE 0.77 09/13/2021 1108   CREATININE 0.83 04/27/2020 1307   CALCIUM 10.4 09/13/2021 1108   GFRNONAA >60 09/06/2021 0352   GFRNONAA 75 04/27/2020 1307   GFRAA 87 04/27/2020 1307    BNP    Component Value Date/Time   BNP 18.4 10/25/2017 0859     Imaging:  DG Chest Port 1 View  Result Date: 09/05/2021 CLINICAL DATA:  Sudden onset of weakness today. EXAM: PORTABLE CHEST 1 VIEW COMPARISON:  08/06/2020 and older studies. FINDINGS: Cardiac silhouette normal in size.  No mediastinal or hilar masses. Clear lungs.  No pleural effusion or pneumothorax. Skeletal structures are grossly intact. IMPRESSION: No active disease. Electronically Signed   By: Amie Portland M.D.   On: 09/05/2021 17:07    cyanocobalamin ((VITAMIN B-12)) injection 1,000 mcg     Date Action Dose Route User   09/13/2021 1108 Given 1,000 mcg Intramuscular (Left Deltoid) Kathreen Devoid, CMA           View : No data to display.          No results found for: NITRICOXIDE      Assessment & Plan:   No problem-specific Assessment & Plan notes found for this encounter.   I spent *** minutes of dedicated to the care of this patient on the date of this encounter to include pre-visit review of records, face-to-face time with the patient discussing conditions above, post visit ordering of testing, clinical documentation with the electronic health record, making appropriate  referrals as documented, and communicating necessary findings to members of the patients care team.  Noemi Chapel, NP 09/30/2021  Pt aware and understands NP's role.

## 2021-10-01 ENCOUNTER — Encounter: Payer: Self-pay | Admitting: Nurse Practitioner

## 2021-10-01 ENCOUNTER — Ambulatory Visit (INDEPENDENT_AMBULATORY_CARE_PROVIDER_SITE_OTHER): Payer: Medicare Other | Admitting: Nurse Practitioner

## 2021-10-01 ENCOUNTER — Other Ambulatory Visit: Payer: Self-pay

## 2021-10-01 VITALS — BP 128/68 | HR 83 | Ht 65.0 in | Wt 328.6 lb

## 2021-10-01 DIAGNOSIS — G4733 Obstructive sleep apnea (adult) (pediatric): Secondary | ICD-10-CM

## 2021-10-01 DIAGNOSIS — J4520 Mild intermittent asthma, uncomplicated: Secondary | ICD-10-CM

## 2021-10-01 DIAGNOSIS — Z6841 Body Mass Index (BMI) 40.0 and over, adult: Secondary | ICD-10-CM

## 2021-10-01 NOTE — Assessment & Plan Note (Addendum)
Breathing stable. Uses Symbicort PRN wheezing/SOB. No recent exacerbations requiring prednisone. ?

## 2021-10-01 NOTE — Assessment & Plan Note (Addendum)
Worsening OSA despite weight loss. Agreeable to restarting CPAP with nasal pillow mask. FF made her face break out. Her current CPAP machine is broken. Will send order for new machine auto 5-15cmH2O.  ? ?Patient Instructions  ?Continue Albuterol inhaler 2 puffs every 6 hours as needed for shortness of breath or wheezing. Notify if symptoms persist despite rescue inhaler/neb use. ?Continue Symbicort 2 puffs Twice daily as needed for shortness of breath/wheezing ? ?Start to use CPAP auto 5-15 cmH2O with nasal pillow mask every night, minimum of 4-6 hours a night.  ?Change equipment every 30 days or as directed by DME. Wash your tubing with warm soap and water daily, hang to dry. Wash humidifier portion weekly.  ?Be aware of reduced alertness and do not drive or operate heavy machinery if experiencing this or drowsiness.  ?Healthy weight management discussed.  ?Avoid or decrease alcohol consumption and medications that make you more sleepy, if possible. ?Notify if persistent daytime sleepiness occurs even with consistent use of CPAP. ? ?We discussed how untreated sleep apnea puts an individual at risk for cardiac arrhthymias, pulm HTN, DM, stroke and increases their risk for daytime accidents.  ? ?Follow up in 31-90 days after starting back on CPAP therapy with Dr. Annamaria Boots. If symptoms do not improve or worsen, please contact office for sooner follow up or seek emergency care. ? ? ?

## 2021-10-01 NOTE — Patient Instructions (Addendum)
Continue Albuterol inhaler 2 puffs every 6 hours as needed for shortness of breath or wheezing. Notify if symptoms persist despite rescue inhaler/neb use. ?Continue Symbicort 2 puffs Twice daily as needed for shortness of breath/wheezing ? ?Start to use CPAP auto 5-15 cmH2O with nasal pillow mask every night, minimum of 4-6 hours a night.  ?Change equipment every 30 days or as directed by DME. Wash your tubing with warm soap and water daily, hang to dry. Wash humidifier portion weekly.  ?Be aware of reduced alertness and do not drive or operate heavy machinery if experiencing this or drowsiness.  ?Healthy weight management discussed.  ?Avoid or decrease alcohol consumption and medications that make you more sleepy, if possible. ?Notify if persistent daytime sleepiness occurs even with consistent use of CPAP. ? ?We discussed how untreated sleep apnea puts an individual at risk for cardiac arrhthymias, pulm HTN, DM, stroke and increases their risk for daytime accidents.  ? ?Follow up in 31-90 days after starting back on CPAP therapy with Dr. Maple Hudson. If symptoms do not improve or worsen, please contact office for sooner follow up or seek emergency care. ?

## 2021-10-01 NOTE — Progress Notes (Signed)
? ?@Patient  ID: [name], female    DOB: 16-Sep-1955, 66 y.o.   MRN: [id number] ? ?Chief Complaint  ?Patient presents with  ? Follow-up  ?  Cpap   ? ? ?Referring provider: ?[place], [name], MD ? ?HPI: ?66 year old female, former smoker (10 pack years) followed for OSA on CPAP and asthma. She is a patient of Dr. Roxy CedarYoung's and last seen in office on 06/23/2020. Past medical history significant for morbid obesity, HTN, HLD, diastolic dysfunction, anemia, allergic rhinitis.  ? ?TEST/EVENTS:  ?01/19/2017 HST: AHI 8.9/hr, SpO2 low 92% ?08/17/2018 office spirometry: mild restriction, possible mild obstruction. FVC 2 (75), FEV1 1.7 (79), ratio 1.83, FEF 25-75% 2.1 (101) ?07/22/2020 HST: AHI 13.5/hr, SpO2 low 82%  ? ?06/23/2020: OV with Dr. Maple HudsonYoung. Maintained on Symbicort and Singulair. CPAP auto 5-15 -> no longer using in over a year. Reported that her face broke out with the full face mask she had. Lost 20 lb and hoping to qualify for hip replacement. Repeat sleep study ordered which showed AHI of 13.5, which is worse than previous. CPAP auto 4-15 with nasal pillow mask ordered. Pt did not qualify for new machine as she received her last in 2018 and wouldn't be able to get a new one until 04/2022.  ? ?09/30/2021: Today - overdue follow up ?Patient presents today for overdue follow up after home sleep study obtained last year which showed mild obstructive sleep apnea, slightly worsened when compared to past. She has been off CPAP and wanting to get back on it. She has daytime fatigue symptoms and reports snoring at night. She also has had trouble falling asleep recently. She denies morning headaches, narcolepsy or drowsy driving. She recently had surgery on her abdomen and is recovering well without any complications.  ? ?Allergies  ?Allergen Reactions  ? Ketorolac Tromethamine Shortness Of Breath and Palpitations  ? Atrovent [Ipratropium] Hives and Rash  ? Latex Rash and Other (See Comments)  ?  NO POWDERED GLOVES!!!!  ? Tape  Dermatitis  ?  Irritates skin   ? ? ?Immunization History  ?Administered Date(s) Administered  ? Influenza Split 03/29/2012  ? Influenza,inj,Quad PF,6+ Mos 04/09/2013, 09/23/2014, 05/04/2015, 04/04/2016, 03/20/2017, 05/02/2018, 04/27/2020  ? Moderna Sars-Covid-2 Vaccination 05/07/2020, 06/07/2020  ? Pneumococcal Polysaccharide-23 03/29/2012, 04/04/2016  ? Tdap 10/14/2010  ? ? ?Past Medical History:  ?Diagnosis Date  ? Abnormal EKG 06/2003  ? History of inverted T waves V1-V3. Normal 2D echo (07/23/2003): LVEF 65%.  ? Anemia   ? BL Hgb 11-12. Ferritin 14 - low normal (08/2007). Colonoscopy 2009 - external hemorrhoids (excellent prep). Last anemia panel (12/2010) - Iron  24, TIBC 269,  B12  316, Folate 11.9, Ferritin 73.  ? Asthma   ? B12 deficiency   ? Back pain   ? Chest pain   ? Degenerative joint disease   ? BL knees (L>R), lumbar spine. Followed by Sports Medicine, Dr. Jennette KettleNeal.  ? Dyspnea   ? History of multiple pulmonary nodules   ? Incidental finding: CT Abd/ Pelvis (04/2010) - Several small lower lobe lung nodules, including one pure ground-glass pulmonary nodule measuring 8 mm in the left lower lobe.  Recommend follow-up chest CT (IV contrast preferred) in 6 months to document stability. //  CT Abd/ Pelvis (07/2010) -  3 mm RLL and  8 mm LLL nodule stable.  Other nodules unchanged, likely benign.  ? Hyperlipidemia   ? Hypertension   ? Incarcerated ventral hernia 04/2010  ? Noted on CT Abd/  pelvis (04/2010). Patient now s/p ventral hernia repair by Dr. Gerrit Friends (12/2010)  ? Insomnia   ? Knee osteoarthritis   ? s/p Left total knee replacement (06/2011)  ? Left hip pain   ? Left ventricular diastolic dysfunction   ? Leg edema   ? Lower extremity edema   ? Chronic. 2D echo (2005) - EF 65%.  ? Obesity   ? OSA (obstructive sleep apnea)   ? Palpitations   ? Positive D dimer   ? Pre-diabetes   ? Right knee pain   ? ? ?Tobacco History: ?Social History  ? ?Tobacco Use  ?Smoking Status Former  ? Packs/day: 0.50  ? Years:  20.00  ? Pack years: 10.00  ? Types: Cigarettes  ? Quit date: 09/10/1998  ? Years since quitting: 23.0  ?Smokeless Tobacco Never  ?Tobacco Comments  ? 66 yo   ? ?Counseling given: Not Answered ?Tobacco comments: 66 yo  ? ? ?Outpatient Medications Prior to Visit  ?Medication Sig Dispense Refill  ? albuterol (VENTOLIN HFA) 108 (90 Base) MCG/ACT inhaler Inhale 1-2 puffs into the lungs every 6 (six) hours as needed for wheezing or shortness of breath. 3 each 0  ? alum & mag hydroxide-simeth (MAALOX MAX) 400-400-40 MG/5ML suspension Take 15 mLs by mouth every 6 (six) hours as needed for indigestion. 355 mL 1  ? budesonide-formoterol (SYMBICORT) 160-4.5 MCG/ACT inhaler Inhale 2 puffs into the lungs 2 (two) times daily. (Patient taking differently: Inhale 2 puffs into the lungs daily as needed (Wheezing and Asthma).) 3 each 4  ? calcium carbonate (OS-CAL - DOSED IN MG OF ELEMENTAL CALCIUM) 1250 (500 Ca) MG tablet Take 1 tablet by mouth 2 (two) times daily with a meal.    ? cholecalciferol (VITAMIN D3) 25 MCG (1000 UNIT) tablet Take 1,000 Units by mouth daily.    ? gabapentin (NEURONTIN) 100 MG capsule Take 2 capsules (200 mg total) by mouth every 12 (twelve) hours. 20 capsule 0  ? Multiple Vitamins-Minerals (BARIATRIC MULTIVITAMINS/IRON PO) Take 1 tablet by mouth daily.    ? ondansetron (ZOFRAN-ODT) 4 MG disintegrating tablet Take 1 tablet (4 mg total) by mouth every 6 (six) hours as needed for nausea or vomiting. 20 tablet 0  ? pantoprazole (PROTONIX) 40 MG tablet Take 1 tablet (40 mg total) by mouth daily. 90 tablet 0  ? rosuvastatin (CRESTOR) 10 MG tablet Take 1 tablet (10 mg total) by mouth daily. 90 tablet 3  ? trolamine salicylate (ASPERCREME) 10 % cream Apply 1 application topically as needed for muscle pain.    ? ?No facility-administered medications prior to visit.  ? ? ? ?Review of Systems:  ? ?Constitutional: No weight loss or gain, night sweats, fevers, chills. +daytime fatigue ?HEENT: No headaches, difficulty  swallowing, tooth/dental problems, or sore throat. No sneezing, itching, ear ache, nasal congestion, or post nasal drip. +snoring ?CV:  No chest pain, orthopnea, PND, swelling in lower extremities, anasarca, dizziness, palpitations, syncope ?Resp: No shortness of breath with exertion or at rest. No excess mucus or change in color of mucus. No productive or non-productive. No hemoptysis. No wheezing.  No chest wall deformity ?Skin: No rash, lesions, ulcerations ?MSK:  No joint pain or swelling.  No decreased range of motion.  No back pain. ?Neuro: No dizziness or lightheadedness.  ?Psych: No depression or anxiety. Mood stable.  ? ? ? ?Physical Exam: ? ?BP 128/68 (BP Location: Left Wrist, Cuff Size: Normal)   Pulse 83   Ht 5\' 5"  (1.651 m)  Wt (!) 328 lb 9.6 oz (149.1 kg)   LMP 09/09/2009   SpO2 98%   BMI 54.68 kg/m?  ? ?GEN: Pleasant, interactive, well-kempt; morbidly obese; in no acute distress. ?HEENT:  Normocephalic and atraumatic. PERRLA. Sclera white. Nasal turbinates pink, moist and patent bilaterally. No rhinorrhea present. Oropharynx pink and moist, without exudate or edema. No lesions, ulcerations, or postnasal drip.  ?NECK:  Supple w/ fair ROM. No JVD present. Normal carotid impulses w/o bruits. Thyroid symmetrical with no goiter or nodules palpated. No lymphadenopathy.   ?CV: RRR, no m/r/g, no peripheral edema. Pulses intact, +2 bilaterally. No cyanosis, pallor or clubbing. ?PULMONARY:  Unlabored, regular breathing. Clear bilaterally A&P w/o wheezes/rales/rhonchi. No accessory muscle use. No dullness to percussion. ?GI: BS present and normoactive. Soft, non-tender to palpation. No organomegaly or masses detected. No CVA tenderness. ?Neuro: A/Ox3. No focal deficits noted.   ?Skin: Warm, no lesions or rashe ?Psych: Normal affect and behavior. Judgement and thought content appropriate.  ? ? ? ?Lab Results: ? ?CBC ?   ?Component Value Date/Time  ? WBC 6.0 09/05/2021 1630  ? RBC 4.29 09/05/2021 1630  ?  HGB 12.2 09/05/2021 1630  ? HGB 11.9 05/09/2018 1007  ? HCT 38.4 09/05/2021 1630  ? HCT 38.1 05/09/2018 1007  ? PLT 390 09/05/2021 1630  ? MCV 89.5 09/05/2021 1630  ? MCV 87 05/09/2018 1007  ? MCH 28.4 09/05/2021

## 2021-10-01 NOTE — Assessment & Plan Note (Signed)
Healthy weight management discussed. 

## 2021-10-05 ENCOUNTER — Telehealth: Payer: Self-pay | Admitting: Nurse Practitioner

## 2021-10-05 NOTE — Telephone Encounter (Signed)
Called  and spoke to patient. She states she was made aware that she cant get a new machine until October but that her machine is not currently working and states that she did not tell anyone who called that her machine was working. Patient was trying to switch to [facility] from [facility]. Will send a community message to [facility] staff to see if patient can have her current cpap unit looked at/replaced.  ? ?

## 2021-10-08 NOTE — Telephone Encounter (Signed)
Checked [name]'s community messages and did not see a reply from [facility] yet. Will continue to wait on their response.  ?

## 2021-10-12 ENCOUNTER — Encounter: Payer: Medicare Other | Attending: Surgery | Admitting: Skilled Nursing Facility1

## 2021-10-12 DIAGNOSIS — E785 Hyperlipidemia, unspecified: Secondary | ICD-10-CM | POA: Insufficient documentation

## 2021-10-12 DIAGNOSIS — Z713 Dietary counseling and surveillance: Secondary | ICD-10-CM | POA: Diagnosis not present

## 2021-10-12 DIAGNOSIS — G4733 Obstructive sleep apnea (adult) (pediatric): Secondary | ICD-10-CM | POA: Insufficient documentation

## 2021-10-12 NOTE — Progress Notes (Signed)
Bariatric Nutrition Follow-Up Visit ?Medical Nutrition Therapy  ? ?NUTRITION ASSESSMENT ?  ?Sleeve gastrectomy  ?Surgery date: 08/16/2021 ?Surgery type: Sleeve ?Start weight at [facility]: 352.6 ?Weight today: 325 pounds ? ?Clinical  ?Medical hx: Prediabetes, sleep apnea ?Medications: ozempic ?Labs: vitmain D 27.67, HDL 36.20, LDL 106, B12 1504 ?Notable signs/symptoms: limited mobility due to hip pain ?Any previous deficiencies? Yes, vitamin B12 , vitamin D  ?  ?Lifestyle & Dietary Hx ? ?Pt states she has been very bored with the current phase. Optimistic for new phase with non-starchy vegetables   ?Pt states she usually uses dry seasonings for their vegetables.  ?Pt states her hip surgeon wants her to be 317 pounds before she can get anew hip.  Pt arrived to appointment in a wheel chair. ?Pt states she is going on a cruise in early May 2023.  Dietitian discussed mindful eating and moderation. ? ? ? ?Estimated daily fluid intake: 64 oz ?Estimated daily protein intake: 70 g ?Supplements: multi and calcium ?Current average weekly physical activity: ADL's due to hip pain  ? ?24-Hr Dietary Recall ?First Meal: protein shake or scrambled egg + cheese + sausage ?Snack: yogurt   ?Second Meal: yogurt or tuna or chicken salad  ?Snack:   ?Third Meal: salmon or chicken wings ?Snack: protein shake ?Beverages: coffee + sugar free stuff, water, gatorade zero ? ?Post-Op Goals/ Signs/ Symptoms ?Using straws: no ?Drinking while eating: no ?Chewing/swallowing difficulties: no ?Changes in vision: no ?Changes to mood/headaches: no ?Hair loss/changes to skin/nails: no ?Difficulty focusing/concentrating: no ?Sweating: no ?Limb weakness: no ?Dizziness/lightheadedness: no ?Palpitations: no  ?Carbonated/caffeinated beverages: no ?N/V/D/C/Gas: taking mirilax once a week having a bowel movement once to twice a week ?Abdominal pain: no ?Dumping syndrome: no ? ?  ?NUTRITION DIAGNOSIS  ?Overweight/obesity ([place]-3.3) related to past poor dietary habits  and physical inactivity as evidenced by completed bariatric surgery and following dietary guidelines for continued weight loss and healthy nutrition status. ?  ?  ?NUTRITION INTERVENTION ?Nutrition counseling (C-1) and education (E-2) to facilitate bariatric surgery goals, including: ?Diet advancement to the next phase (phase 4) now including non starchy vegetables  ?The importance of consuming adequate calories as well as certain nutrients daily due to the body's need for essential vitamins, minerals, and fats ?The importance of daily physical activity and to reach a goal of at least 150 minutes of moderate to vigorous physical activity weekly (or as directed by their physician) due to benefits such as increased musculature and improved lab values ?The importance of intuitive eating specifically learning hunger-satiety cues and understanding the importance of learning a new body: The importance of mindful eating to avoid grazing behaviors  ? ?Goals: ?-Continue to aim for a minimum of 64 fluid ounces 7 days a week with at least 30 ounces being plain water ? ?-Eat non-starchy vegetables 2 times a day 7 days a week ? ?-Start out with soft cooked vegetables today and tomorrow; if tolerated begin to eat raw vegetables or cooked including salads ? ?-Eat your 3 ounces of protein first then start in on your non-starchy vegetables; once you understand how much of your meal leads to satisfaction and not full while still eating 3 ounces of protein and non-starchy vegetables you can eat them in any order  ? ?-Do NOT cook with/add to your food: alfredo sauce, cheese sauce, barbeque sauce, ketchup, fat back, butter, bacon grease, grease, Crisco, OR SUGAR ? ? ?Handouts Provided Include  ?Phase 4 ? ?Learning Style & Readiness for Change ?Teaching method  utilized: Patent attorney & Auditory  ?Demonstrated degree of understanding via: Teach Back  ?Readiness Level: Ready ?Barriers to learning/adherence to lifestyle change: Lack of mobility  and pain from hip ? ?RD's Notes for Next Visit ?Assess adherence to pt chosen goals ? ? ? ?MONITORING & EVALUATION ?Dietary intake, weekly physical activity, body weight ? ?Next Steps ?Patient is to follow-up in July for 6 month post-op class. ?

## 2021-10-27 ENCOUNTER — Other Ambulatory Visit: Payer: Self-pay | Admitting: Orthopedic Surgery

## 2021-10-27 DIAGNOSIS — M1612 Unilateral primary osteoarthritis, left hip: Secondary | ICD-10-CM

## 2021-11-04 ENCOUNTER — Ambulatory Visit
Admission: RE | Admit: 2021-11-04 | Discharge: 2021-11-04 | Disposition: A | Discharge status: Home / Self Care | Payer: Medicare Other | Source: Ambulatory Visit | Attending: Orthopedic Surgery | Admitting: Orthopedic Surgery

## 2021-11-04 DIAGNOSIS — M1612 Unilateral primary osteoarthritis, left hip: Secondary | ICD-10-CM

## 2021-11-04 MED ORDER — IOPAMIDOL (ISOVUE-M 200) INJECTION 41%
1.0000 mL | Freq: Once | INTRAMUSCULAR | Status: AC
  Administered 2021-11-04: 1 mL via INTRA_ARTICULAR

## 2021-11-04 MED ORDER — METHYLPREDNISOLONE ACETATE 40 MG/ML INJ SUSP (RADIOLOG
80.0000 mg | Freq: Once | INTRAMUSCULAR | Status: AC
  Administered 2021-11-04: 80 mg via INTRA_ARTICULAR

## 2021-11-30 ENCOUNTER — Telehealth: Payer: Self-pay | Admitting: Pharmacist

## 2021-11-30 NOTE — Chronic Care Management (AMB) (Signed)
    Chronic Care Management Pharmacy Assistant   Name: [name]  MRN: [id number] DOB: 1956/06/14  11/30/21 APPOINTMENT REMINDER    Patient was reminded to have all medications, supplements and any blood glucose and blood pressure readings available for review with Gaylord Shih, Pharm. D, for telephone visit on 12/01/21 at 2.    Care Gaps: Zoster Vaccine - Overdue COVID Booster - Overdue TDAP - Overdue AWV- 8/22 BP- 128/68 (10/01/21) Lab Results  Component Value Date   HGBA1C 5.4 08/09/2021    Star Rating Drug: Lisinopril (Zestril) 40 mg - Last filled 10/14/21 100 DS at [facility] Rosuvastatin (Crestor) 10 mg - Last filled 09/24/21 90 DS at [facility]      Medications: Outpatient Encounter Medications as of 11/30/2021  Medication Sig   albuterol (VENTOLIN HFA) 108 (90 Base) MCG/ACT inhaler Inhale 1-2 puffs into the lungs every 6 (six) hours as needed for wheezing or shortness of breath.   alum & mag hydroxide-simeth (MAALOX MAX) 400-400-40 MG/5ML suspension Take 15 mLs by mouth every 6 (six) hours as needed for indigestion.   budesonide-formoterol (SYMBICORT) 160-4.5 MCG/ACT inhaler Inhale 2 puffs into the lungs 2 (two) times daily. (Patient taking differently: Inhale 2 puffs into the lungs daily as needed (Wheezing and Asthma).)   calcium carbonate (OS-CAL - DOSED IN MG OF ELEMENTAL CALCIUM) 1250 (500 Ca) MG tablet Take 1 tablet by mouth 2 (two) times daily with a meal.   cholecalciferol (VITAMIN D3) 25 MCG (1000 UNIT) tablet Take 1,000 Units by mouth daily.   gabapentin (NEURONTIN) 100 MG capsule Take 2 capsules (200 mg total) by mouth every 12 (twelve) hours.   Multiple Vitamins-Minerals (BARIATRIC MULTIVITAMINS/IRON PO) Take 1 tablet by mouth daily.   ondansetron (ZOFRAN-ODT) 4 MG disintegrating tablet Take 1 tablet (4 mg total) by mouth every 6 (six) hours as needed for nausea or vomiting.   pantoprazole (PROTONIX) 40 MG tablet Take 1 tablet (40 mg total) by mouth daily.    rosuvastatin (CRESTOR) 10 MG tablet Take 1 tablet (10 mg total) by mouth daily.   trolamine salicylate (ASPERCREME) 10 % cream Apply 1 application topically as needed for muscle pain.   No facility-administered encounter medications on file as of 11/30/2021.     Pamala Duffel CMA Clinical Pharmacist Assistant [phone]

## 2021-12-01 ENCOUNTER — Ambulatory Visit (INDEPENDENT_AMBULATORY_CARE_PROVIDER_SITE_OTHER): Payer: Medicare Other | Admitting: Pharmacist

## 2021-12-01 DIAGNOSIS — I10 Essential (primary) hypertension: Secondary | ICD-10-CM

## 2021-12-01 DIAGNOSIS — E785 Hyperlipidemia, unspecified: Secondary | ICD-10-CM

## 2021-12-01 NOTE — Patient Instructions (Addendum)
Hi [name],  It was great to catch up with you again!  Please reach out to me if you have any questions or need anything before our follow up!  Best, Maddie  Gaylord Shih, PharmD, [facility] Clinical Pharmacist [facility] [place] [phone]   Visit Information   Goals Addressed   None    Patient Care Plan: CCM Pharmacy Care Plan     Problem Identified: Problem: Hypertension, Hyperlipidemia, Asthma, Depression, Allergic Rhinitis and Pre-diabetes, Chronic pain      Long-Range Goal: Patient-Specific Goal   Start Date: 12/02/2020  Expected End Date: 12/02/2021  Recent Progress: On track  Priority: High  Note:   Current Barriers:  Unable to independently monitor therapeutic efficacy  Pharmacist Clinical Goal(s):  Patient will achieve adherence to monitoring guidelines and medication adherence to achieve therapeutic efficacy through collaboration with PharmD and provider.   Interventions: 1:1 collaboration with [name], [name], MD regarding development and update of comprehensive plan of care as evidenced by provider attestation and co-signature Inter-disciplinary care team collaboration (see longitudinal plan of care) Comprehensive medication review performed; medication list updated in electronic medical record  Hypertension (BP goal <140/90) -Not ideally controlled -Current treatment: Lisinopril 40 mg daily  - Appropriate, Query effective, Query Safe, Accessible -Medications previously tried: HCTZ -Current home readings: 125-130/70-75 (arm cuff) -Current dietary habits: avoids salt and doesn't use it to season or cook with at all -Current exercise habits: nothing structured -Denies hypotensive/hypertensive symptoms -Educated on Exercise goal of 150 minutes per week; Importance of home blood pressure monitoring; Proper BP monitoring technique; -Counseled to monitor BP at home weekly, document, and provide log at future appointments -Counseled on diet and  exercise extensively Recommended to continue current medication  Hyperlipidemia: (LDL goal < 100) -Uncontrolled -Current treatment: Rosuvastatin 10 mg daily - Appropriate, Query effective, Safe, Accessible -Medications previously tried: none  -Current dietary patterns: did not discuss -Current exercise habits: was doing some chair exercises but not often -Educated on Cholesterol goals;  Benefits of statin for ASCVD risk reduction; Importance of limiting foods high in cholesterol; Exercise goal of 150 minutes per week; -Counseled on diet and exercise extensively Recommended to continue current medication  Pre-diabetes/weight loss (A1c goal <6.5%) -Controlled -Current medications: No medications -Medications previously tried: Ozempic -Current home glucose readings fasting glucose: does not need to check post prandial glucose: does not need to check -Denies hypoglycemic/hyperglycemic symptoms -Current meal patterns:  breakfast: did not discuss  lunch: did not discuss   dinner: did not discuss  snacks: did not discuss  drinks: did not discuss  -Current exercise: was doing chair exercises but does not do often -Educated on A1c and blood sugar goals; Exercise goal of 150 minutes per week; Carbohydrate counting and/or plate method -Counseled to check feet daily and get yearly eye exams -Counseled on diet and exercise extensively Recommended to continue current medication  Asthma (Goal: control symptoms) -Not ideally controlled -Current treatment  Albuterol as needed - Appropriate, Effective, Safe, Accessible Symbicort 160-4.5 mcg/act 2 puffs twice daily - Appropriate, Effective, Safe, Accessible -Medications previously tried: none  -Pulmonary function testing: n/a -Patient denies consistent use of maintenance inhaler -Frequency of rescue inhaler use: not often -Counseled on Benefits of consistent maintenance inhaler use When to use rescue inhaler Differences between  maintenance and rescue inhalers -Recommended to use Symbicort daily as prescribed. Patient was worried about weight gain given steroid component but discussed low risk due to travel to the lungs  GERD (Goal: minimize symptoms) -Controlled -Current treatment  Pantoprazole 40 mg daily as needed - Appropriate, Effective, Safe, Accessible -Medications previously tried: none  -Recommended to continue current medication   Chronic pain (Goal: minimize pain) -Controlled -Current treatment  Gabapentin 300 mg 1 cap AM, 1 cap Noon, 2 cap HS (sometimes will take 2nd capsule at lunch) - ran out Methocarbamol 500 mg every 8 hours as needed (hasn't used)  Tizanidine 2 mg three times daily as needed (twice daily) - ran out Tramadol 50 mg twice daily as needed (hasn't used in 1-2 weeks) -Medications previously tried: n/a  - Discussed at length the importance of combining medications whenever possible to minimize risk of CNS depressants. Discussed avoiding methocarbamol and tizanidine due to duplication of therapy.   Health Maintenance -Vaccine gaps: shingrix, tetanus, COVID booster -Current therapy:  No medications -Educated on Cost vs benefit of each product must be carefully weighed by individual consumer -Patient is satisfied with current therapy and denies issues -Recommended to continue as is  Patient Goals/Self-Care Activities Patient will:  - take medications as prescribed check blood pressure weekly, document, and provide at future appointments target a minimum of 150 minutes of moderate intensity exercise weekly  Follow Up Plan: Telephone follow up appointment with care management team member scheduled for: 6 months       Patient verbalizes understanding of instructions and care plan provided today and agrees to view in MyChart. Active MyChart status and patient understanding of how to access instructions and care plan via MyChart confirmed with patient.    Telephone follow up  appointment with pharmacy team member scheduled for: 6 months  Verner Chol, [facility]

## 2021-12-01 NOTE — Progress Notes (Signed)
Chronic Care Management Pharmacy Note  12/01/2021 Name:  [name] MRN:  [id number] DOB:  [birth year]  Summary: LDL not ideally at goal < 100 Pt restarting lisinopril on her own  Recommendations/Changes made from today's visit: -Recommended to bring BP cuff to office visit to ensure accuracy -Recommended weekly BP monitoring at home  Plan: BP assessment in 3 months Scheduled PCP HTN follow up  Subjective: [name] is an 66 y.o. year old female who is a primary patient of [name], [name], MD.  The CCM team was consulted for assistance with disease management and care coordination needs.    Engaged with patient by telephone for follow up visit in response to provider referral for pharmacy case management and/or care coordination services.   Consent to Services:  The patient was given information about Chronic Care Management services, agreed to services, and gave verbal consent prior to initiation of services.  Please see initial visit note for detailed documentation.   Patient Care Team: [name], [name], MD as PCP - General (Family Medicine) Sueanne Margarita, MD as PCP - Cardiology (Cardiology) Viona Gilmore, [facility] as Pharmacist (Pharmacist)  Recent office visits: 09/13/21 [name], MD: Patient presented for hospital follow up. Vitamin D low, vitamin B12 high, LDL elevated.  06/14/21 [name], [name], MD - Patient presented via [facility] for Chronic bilateral low back pain unspecified whether sciatica present and other concerns. Prescribed prednisone 40 mg and Tizanidine 4 mg. Stopped Semaglutide 2 mg.  Recent consult visits: 10/12/21 [name], RD (Nutrition): Patient presented for nutrition counseling post bariatric surgery. Follow up in July.  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 Follow up in 6 months.  10/01/21 [name], NP (pulmonary): Patient presented for OSA follow  up. Recommended use of CPAP nightly.  08/31/21 [name], RD (Nutrition): Patient presented for nutrition counseling post gastric bypass. Follow up in 6 weeks.  07/19/21 [name], RD (Nutrition): Patient presented for nutrition counseling pre-gastric bypass. Follow up 2 weeks post op.  06/18/21 Barker,Trevor T (Behavioral H) - Patient presented for Morbid obesity, No other visit details available.  Hospital visits: 2/26-2/27/23 Patient admitted to [facility] for syncope. Held antihypertensives. Stopped diclofenac.  2/6-2/10/23 Patient admitted to [facility] for Roux-en Y gastric bypass surgery.   Objective:  Lab Results  Component Value Date   CREATININE 0.77 09/13/2021   BUN 11 09/13/2021   GFR 81.49 09/13/2021   GFRNONAA >60 09/06/2021   GFRAA 87 04/27/2020   NA 139 09/13/2021   K 3.9 09/13/2021   CALCIUM 10.4 09/13/2021   CO2 24 09/13/2021   GLUCOSE 103 (H) 09/13/2021    Lab Results  Component Value Date/Time   HGBA1C 5.4 08/09/2021 11:25 AM   HGBA1C 5.7 12/02/2019 09:13 AM   GFR 81.49 09/13/2021 11:08 AM   GFR 85.59 07/01/2021 11:06 AM   MICROALBUR 0.76 08/16/2007 09:19 PM    Last diabetic Eye exam: No results found for: HMDIABEYEEXA  Last diabetic Foot exam: No results found for: HMDIABFOOTEX   Lab Results  Component Value Date   CHOL 161 09/13/2021   HDL 36.20 (L) 09/13/2021   LDLCALC 106 (H) 09/13/2021   TRIG 97.0 09/13/2021   CHOLHDL 4 09/13/2021       Latest Ref Rng & Units 09/05/2021    4:30 PM 08/09/2021   11:25 AM 10/27/2020    3:47 PM  Hepatic Function  Total Protein 6.5 - 8.1 g/dL 7.2   7.6   7.3    Albumin 3.5 - 5.0 g/dL 3.9   3.9   4.4    AST 15 - 41 U/L _0 ALT 0 - 44 U/L _1 Alk Phosphatase 38 - 126 U/L 70   86   122    Total Bilirubin 0.3 - 1.2 mg/dL 0.4   0.4   0.4      Lab Results  Component Value Date/Time   TSH 1.900 05/09/2018 10:07 AM   TSH 3.440 10/25/2017  08:59 AM   TSH 1.307 07/16/2014 12:02 PM   FREET4 1.20 05/09/2018 10:07 AM       Latest Ref Rng & Units 09/05/2021    4:30 PM 08/18/2021    4:26 AM 08/17/2021    4:45 AM  CBC  WBC 4.0 - 10.5 K/uL 6.0   7.9   10.5    Hemoglobin 12.0 - 15.0 g/dL 12.2   11.8   11.6    Hematocrit 36.0 - 46.0 % 38.4   38.1   37.2    Platelets 150 - 400 K/uL 390   296   329      Lab Results  Component Value Date/Time   VD25OH 27.67 (L) 09/13/2021 11:08 AM   VD25OH 18.42 (L) 09/29/2020 03:11 PM    Clinical ASCVD: No  The 10-year ASCVD risk score (Arnett DK, et al., 2019) is: 9.7%   Values used to calculate the score:     Age: 58 years     Sex: Female     Is Non-Hispanic African American: Yes     Diabetic: No     Tobacco smoker: No     Systolic Blood Pressure: 016 mmHg     Is BP treated: Yes     HDL Cholesterol: 36.2 mg/dL     Total Cholesterol: 161 mg/dL       09/13/2021   10:32 AM 02/17/2021    1:11 PM 02/12/2020    9:46 AM  Depression screen PHQ 2/9  Decreased Interest 0 0 0  Down, Depressed, Hopeless 0 0 0  PHQ - 2 Score 0 0 0  Altered sleeping 1    Tired, decreased energy 1    Change in appetite 0    Feeling bad or failure about yourself  0    Trouble concentrating 0    Moving slowly or fidgety/restless 0    Suicidal thoughts 0    PHQ-9 Score 2    Difficult doing work/chores Not difficult at all       Social History   Tobacco Use  Smoking Status Former   Packs/day: 0.50   Years: 20.00   Pack years: 10.00   Types: Cigarettes   Quit date: 09/10/1998   Years since quitting: 23.2  Smokeless Tobacco Never  Tobacco Comments   66 yo    BP Readings from Last 3 Encounters:  10/01/21 128/68  09/13/21 136/70  09/06/21 (!) 143/65   Pulse Readings from Last 3 Encounters:  10/01/21 83  09/13/21 (!) 102  09/06/21 74   Wt Readings from Last 3 Encounters:  10/12/21 (!) 325 lb (147.4 kg)  10/01/21 (!) 328 lb 9.6 oz (149.1 kg)  09/13/21 (!) 330 lb 4 oz (149.8 kg)   BMI Readings from  Last 3 Encounters:  10/12/21 54.08 kg/m  10/01/21 54.68 kg/m  09/13/21 54.96  kg/m    Assessment/Interventions: Review of patient past medical history, allergies, medications, health status, including review of consultants reports, laboratory and other test data, was performed as part of comprehensive evaluation and provision of chronic care management services.   SDOH:  (Social Determinants of Health) assessments and interventions performed: No  SDOH Screenings   Alcohol Screen: Not on file  Depression (PHQ2-9): Low Risk    PHQ-2 Score: 2  Financial Resource Strain: Low Risk    Difficulty of Paying Living Expenses: Not very hard  Food Insecurity: No Food Insecurity   Worried About Charity fundraiser in the Last Year: Never true   Ran Out of Food in the Last Year: Never true  Housing: Low Risk    Last Housing Risk Score: 0  Physical Activity: Unknown   Days of Exercise per Week: Patient refused   Minutes of Exercise per Session: 10 min  Social Connections: Moderately Integrated   Frequency of Communication with Friends and Family: More than three times a week   Frequency of Social Gatherings with Friends and Family: Once a week   Attends Religious Services: More than 4 times per year   Active Member of [employer] or Organizations: No   Attends Archivist Meetings: Never   Marital Status: Married  Stress: No Stress Concern Present   Feeling of Stress : Only a little  Tobacco Use: Medium Risk   Smoking Tobacco Use: Former   Smokeless Tobacco Use: Never   Passive Exposure: Not on file  Transportation Needs: Unmet Transportation Needs   Lack of Transportation (Medical): No   Lack of Transportation (Non-Medical): Yes    CCM Care Plan  Allergies  Allergen Reactions   Ketorolac Tromethamine Shortness Of Breath and Palpitations   Atrovent [Ipratropium] Hives and Rash   Latex Rash and Other (See Comments)    NO POWDERED GLOVES!!!!   Tape Dermatitis    Irritates skin      Medications Reviewed Today     Reviewed by Viona Gilmore, [facility] (Pharmacist) on 12/01/21 at 1423  Med List Status: <None>   Medication Order Taking? Sig Documenting Provider Last Dose Status Informant  albuterol (VENTOLIN HFA) 108 (90 Base) MCG/ACT inhaler [id number]  Inhale 1-2 puffs into the lungs every 6 (six) hours as needed for wheezing or shortness of breath. [name], [name], MD  Active Self  alum & mag hydroxide-simeth (MAALOX MAX) 889-169-45 MG/5ML suspension [id number]  Take 15 mLs by mouth every 6 (six) hours as needed for indigestion. Aline August, MD  Active   budesonide-formoterol [facility]) 160-4.5 MCG/ACT inhaler [id number]  Inhale 2 puffs into the lungs 2 (two) times daily.  Patient taking differently: Inhale 2 puffs into the lungs daily as needed (Wheezing and Asthma).   [name], [name], MD  Active Self  calcium carbonate (OS-CAL - DOSED IN MG OF ELEMENTAL CALCIUM) 1250 (500 Ca) MG tablet [id number] Yes Take 1 tablet by mouth 2 (two) times daily with a meal. [provider] Taking Active Self  Cholecalciferol (VITAMIN D) 50 MCG (2000 UT) CAPS [id number] Yes Take 2,000 Units by mouth daily. [provider] Taking Active Self  diclofenac Sodium (VOLTAREN) 1 % GEL [id number] Yes Apply 2 g topically 4 (four) times daily. [provider] Taking Active   gabapentin (NEURONTIN) 100 MG capsule [id number] No Take 2 capsules (200 mg total) by mouth every 12 (twelve) hours.  Patient not taking: Reported on 12/01/2021   Stechschulte, Nickola Major, MD Not Taking Active Self  lisinopril (ZESTRIL) 40 MG tablet [id number] Yes Take 40 mg by mouth daily. [provider] Taking Active   Multiple Vitamins-Minerals (BARIATRIC MULTIVITAMINS/IRON PO) [id number] Yes Take 1 tablet by mouth daily. [provider] Taking Active Self  ondansetron (ZOFRAN-ODT) 4 MG disintegrating tablet [id number]  Take 1 tablet (4 mg total) by mouth every 6 (six) hours as needed for  nausea or vomiting. Stechschulte, Nickola Major, MD  Active Self  pantoprazole (PROTONIX) 40 MG tablet [id number]  Take 1 tablet (40 mg total) by mouth daily. Stechschulte, Nickola Major, MD  Active Self  rosuvastatin (CRESTOR) 10 MG tablet [id number] Yes Take 1 tablet (10 mg total) by mouth daily. [name], [name], MD Taking Active   trolamine salicylate (ASPERCREME) 10 % cream [id number]  Apply 1 application topically as needed for muscle pain. [provider]  Active Self            Patient Active Problem List   Diagnosis Date Noted   Hypokalemia 09/06/2021   AKI (acute kidney injury) ([place]) 09/06/2021   Syncope 09/05/2021   S/P laparoscopic sleeve gastrectomy 09/05/2021   Morbid obesity with BMI of 50.0-59.9, adult ([place]) 08/16/2021   Radiculopathy due to lumbar intervertebral disc disorder 12/02/2019   Depression 12/11/2018   Vitamin D deficiency 07/18/2018   Prediabetes 07/18/2018   Morbid obesity ([place]) 07/18/2018   Insomnia 11/06/2017   Chest pain 10/25/2017   Asthma in adult, mild intermittent, uncomplicated 40/97/3532   Hypertension 10/25/2017   Obstructive sleep apnea 10/25/2017   Left ventricular diastolic dysfunction 99/24/2683   Positive D dimer 10/25/2017   B12 deficiency 10/25/2017   Depression, major, single episode, complete remission ([place]) 09/29/2017   Pain due to total left knee replacement ([place]) 10/21/2016   B12 nutritional deficiency 09/27/2016   Hyperlipidemia 04/02/2012   Lower abdominal pain 12/30/2010   Preventative health care 09/10/2010   LUNG NODULE 05/10/2010   Low back pain 01/20/2009   Generalized osteoarthritis of multiple sites (left hip,knee,and lower back) 06/02/2008   ANEMIA-NOS 08/15/2006   Essential hypertension 08/11/2006   Allergic rhinitis 08/11/2006    Immunization History  Administered Date(s) Administered   Influenza Split 03/29/2012   Influenza,inj,Quad PF,6+ Mos 04/09/2013, 09/23/2014, 05/04/2015, 04/04/2016, 03/20/2017, 05/02/2018,  04/27/2020   Moderna Sars-Covid-2 Vaccination 05/07/2020, 06/07/2020   Pneumococcal Polysaccharide-23 03/29/2012, 04/04/2016   Tdap 10/14/2010   Patient reports she has to be down to 260 lbs to do the hip surgery but this is limiting her. She is feeling much better since the gastric bypass and is trying to stay active. She has been doing some chair exercises and leg exercises but they give her pain later on the in the day and night so she doesn't do them every day.  Patient has noticed a big change in appetite since the procedure. She noticed she is getting tired of certain foods more than usual. She has been eating salads, yogurt and water and hasn't been doing much meat. Patient has lost about > 25 lbs and she is meeting with the surgeon every 3 months.  Conditions to be addressed/monitored:  Hypertension, Hyperlipidemia, Asthma, Depression, Allergic Rhinitis and Pre-diabetes, Chronic pain  Conditions addressed this visit: Chronic pain, hyperlipidemia, hypertension  Care Plan : CCM Pharmacy Care Plan  Updates made by Viona Gilmore, [place] since 12/01/2021 12:00 AM     Problem: Problem: Hypertension, Hyperlipidemia, Asthma, Depression, Allergic Rhinitis and Pre-diabetes, Chronic pain      Long-Range Goal: Patient-Specific Goal   Start Date: 12/02/2020  Expected End  Date: 12/02/2021  Recent Progress: On track  Priority: High  Note:   Current Barriers:  Unable to independently monitor therapeutic efficacy  Pharmacist Clinical Goal(s):  Patient will achieve adherence to monitoring guidelines and medication adherence to achieve therapeutic efficacy through collaboration with PharmD and provider.   Interventions: 1:1 collaboration with [name], [name], MD regarding development and update of comprehensive plan of care as evidenced by provider attestation and co-signature Inter-disciplinary care team collaboration (see longitudinal plan of care) Comprehensive medication review performed;  medication list updated in electronic medical record  Hypertension (BP goal <140/90) -Not ideally controlled -Current treatment: Lisinopril 40 mg daily  - Appropriate, Query effective, Query Safe, Accessible -Medications previously tried: HCTZ -Current home readings: 125-130/70-75 (arm cuff) -Current dietary habits: avoids salt and doesn't use it to season or cook with at all -Current exercise habits: nothing structured -Denies hypotensive/hypertensive symptoms -Educated on Exercise goal of 150 minutes per week; Importance of home blood pressure monitoring; Proper BP monitoring technique; -Counseled to monitor BP at home weekly, document, and provide log at future appointments -Counseled on diet and exercise extensively Recommended to continue current medication  Hyperlipidemia: (LDL goal < 100) -Uncontrolled -Current treatment: Rosuvastatin 10 mg daily - Appropriate, Query effective, Safe, Accessible -Medications previously tried: none  -Current dietary patterns: did not discuss -Current exercise habits: was doing some chair exercises but not often -Educated on Cholesterol goals;  Benefits of statin for ASCVD risk reduction; Importance of limiting foods high in cholesterol; Exercise goal of 150 minutes per week; -Counseled on diet and exercise extensively Recommended to continue current medication  Pre-diabetes/weight loss (A1c goal <6.5%) -Controlled -Current medications: No medications -Medications previously tried: Ozempic -Current home glucose readings fasting glucose: does not need to check post prandial glucose: does not need to check -Denies hypoglycemic/hyperglycemic symptoms -Current meal patterns:  breakfast: did not discuss  lunch: did not discuss   dinner: did not discuss  snacks: did not discuss  drinks: did not discuss  -Current exercise: was doing chair exercises but does not do often -Educated on A1c and blood sugar goals; Exercise goal of 150 minutes  per week; Carbohydrate counting and/or plate method -Counseled to check feet daily and get yearly eye exams -Counseled on diet and exercise extensively Recommended to continue current medication  Asthma (Goal: control symptoms) -Not ideally controlled -Current treatment  Albuterol as needed - Appropriate, Effective, Safe, Accessible Symbicort 160-4.5 mcg/act 2 puffs twice daily - Appropriate, Effective, Safe, Accessible -Medications previously tried: none  -Pulmonary function testing: n/a -Patient denies consistent use of maintenance inhaler -Frequency of rescue inhaler use: not often -Counseled on Benefits of consistent maintenance inhaler use When to use rescue inhaler Differences between maintenance and rescue inhalers -Recommended to use Symbicort daily as prescribed. Patient was worried about weight gain given steroid component but discussed low risk due to travel to the lungs  GERD (Goal: minimize symptoms) -Controlled -Current treatment  Pantoprazole 40 mg daily as needed - Appropriate, Effective, Safe, Accessible -Medications previously tried: none  -Recommended to continue current medication   Chronic pain (Goal: minimize pain) -Controlled -Current treatment  Gabapentin 300 mg 1 cap AM, 1 cap Noon, 2 cap HS (sometimes will take 2nd capsule at lunch) - ran out Methocarbamol 500 mg every 8 hours as needed (hasn't used)  Tizanidine 2 mg three times daily as needed (twice daily) - ran out Tramadol 50 mg twice daily as needed (hasn't used in 1-2 weeks) -Medications previously tried: n/a  - Discussed at length the  importance of combining medications whenever possible to minimize risk of CNS depressants. Discussed avoiding methocarbamol and tizanidine due to duplication of therapy.   Health Maintenance -Vaccine gaps: shingrix, tetanus, COVID booster -Current therapy:  No medications -Educated on Cost vs benefit of each product must be carefully weighed by individual  consumer -Patient is satisfied with current therapy and denies issues -Recommended to continue as is  Patient Goals/Self-Care Activities Patient will:  - take medications as prescribed check blood pressure weekly, document, and provide at future appointments target a minimum of 150 minutes of moderate intensity exercise weekly  Follow Up Plan: Telephone follow up appointment with care management team member scheduled for: 6 months      Medication Assistance: None required.  Patient affirms current coverage meets needs.  Compliance/Adherence/Medication fill history: Care Gaps: Shingrix, COVID booster, tetanus A1c: 5.4% (08/09/21) BP- 128/68 (10/01/21)  Star-Rating Drugs: Lisinopril (Zestril) 40 mg - Last filled 10/14/21 100 DS at [facility] Rosuvastatin (Crestor) 10 mg - Last filled 09/24/21 90 DS at [facility]  Patient's preferred pharmacy is:  [id number] [place] [place] [id number]: [phone]: [phone]  Uses pill box? Yes Pt endorses 100% compliance  We discussed: Current pharmacy is preferred with insurance plan and patient is satisfied with pharmacy services Patient decided to: Continue current medication management strategy  Care Plan and Follow Up Patient Decision:  Patient agrees to Care Plan and Follow-up.  Plan: Telephone follow up appointment with care management team member scheduled for:  6 months  MJeni Salles PharmD BProctorvillePharmacist LLake Andesat [phone]

## 2021-12-08 DIAGNOSIS — E785 Hyperlipidemia, unspecified: Secondary | ICD-10-CM | POA: Diagnosis not present

## 2021-12-08 DIAGNOSIS — I10 Essential (primary) hypertension: Secondary | ICD-10-CM | POA: Diagnosis not present

## 2021-12-08 DIAGNOSIS — J45909 Unspecified asthma, uncomplicated: Secondary | ICD-10-CM | POA: Diagnosis not present

## 2021-12-08 DIAGNOSIS — Z87891 Personal history of nicotine dependence: Secondary | ICD-10-CM

## 2021-12-25 ENCOUNTER — Other Ambulatory Visit: Payer: Self-pay | Admitting: Family Medicine

## 2022-01-17 NOTE — Progress Notes (Signed)
[name] is a 66 y.o.female, who is here today for follow up.  Last follow up visit: 09/13/21. Since her last visit she has seen her pulmonologist for OSA and has followed-up with nutritionist and general surgeon (s/p Roux-en-Y gastric bypass).  HLD: Until 3-4 months ago she was taking rosuvastatin 10 mg 1/2 tablet daily, she is now on 1 tablet daily. Overall she is tolerating medication well.  Lab Results  Component Value Date   CHOL 161 09/13/2021   HDL 36.20 (L) 09/13/2021   LDLCALC 106 (H) 09/13/2021   TRIG 97.0 09/13/2021   CHOLHDL 4 09/13/2021   The 10-year ASCVD risk score ([name], et al., 2019) is: 9.3%   Values used to calculate the score:     Age: 48 years     Sex: Female     Is Non-Hispanic African American: Yes     Diabetic: No     Tobacco smoker: No     Systolic Blood Pressure: 136 mmHg     Is BP treated: Yes     HDL Cholesterol: 36.2 mg/dL     Total Cholesterol: 161 mg/dL  Hypertension:  Medications: Lisinopril 40 mg daily. BP readings at home:< 130/80. Side effects:None. Negative for unusual or severe headache, visual changes, exertional chest pain, dyspnea,  focal weakness, or worsening edema.  Lab Results  Component Value Date   CREATININE 0.77 09/13/2021   BUN 11 09/13/2021   NA 139 09/13/2021   K 3.9 09/13/2021   CL 105 09/13/2021   CO2 24 09/13/2021   Pretibial achy pain, intermittent. Negative for erythema. Problem has been going on for a while. Hx of Vit D def, she is on vitamin D supplementation. S/P bariatric surgery in 08/2021.  She is not exercising regularly due to chronic pain, mainly lower back and left hip. Left hip surgery cannot be done until BMI is under 40. Requesting refills of gabapentin, which she takes at bedtime as needed for lower back pain radiated to right lower extremity, shooting leg pain that interferes with his sleep. Negative for saddle anesthesia or bowel/bladder dysfunction.  She uses a walker. Today  she is in a wheel chair.  Review of Systems  Constitutional:  Negative for activity change, appetite change and fever.  HENT:  Negative for mouth sores, nosebleeds and trouble swallowing.   Respiratory:  Negative for cough and wheezing.   Gastrointestinal:  Negative for abdominal pain, nausea and vomiting.       Negative for changes in bowel habits.  Genitourinary:  Negative for decreased urine volume, dysuria and hematuria.  Musculoskeletal:  Positive for arthralgias, back pain and gait problem.  Neurological:  Negative for syncope, facial asymmetry and weakness.  Psychiatric/Behavioral:  Negative for confusion. The patient is not nervous/anxious.   Rest see pertinent positives and negatives per HPI.  Current Outpatient Medications on File Prior to Visit  Medication Sig Dispense Refill   albuterol (VENTOLIN HFA) 108 (90 Base) MCG/ACT inhaler Inhale 1-2 puffs into the lungs every 6 (six) hours as needed for wheezing or shortness of breath. 3 each 0   alum & mag hydroxide-simeth (MAALOX MAX) 400-400-40 MG/5ML suspension Take 15 mLs by mouth every 6 (six) hours as needed for indigestion. 355 mL 1   budesonide-formoterol (SYMBICORT) 160-4.5 MCG/ACT inhaler Inhale 2 puffs into the lungs 2 (two) times daily. (Patient taking differently: Inhale 2 puffs into the lungs daily as needed (Wheezing and Asthma).) 3 each 4   calcium carbonate (OS-CAL - DOSED  IN MG OF ELEMENTAL CALCIUM) 1250 (500 Ca) MG tablet Take 1 tablet by mouth 2 (two) times daily with a meal.     Cholecalciferol (VITAMIN D) 50 MCG (2000 UT) CAPS Take 2,000 Units by mouth daily.     diclofenac Sodium (VOLTAREN) 1 % GEL Apply 2 g topically 4 (four) times daily.     Multiple Vitamins-Minerals (BARIATRIC MULTIVITAMINS/IRON PO) Take 1 tablet by mouth daily.     ondansetron (ZOFRAN-ODT) 4 MG disintegrating tablet Take 1 tablet (4 mg total) by mouth every 6 (six) hours as needed for nausea or vomiting. 20 tablet 0   pantoprazole (PROTONIX)  40 MG tablet Take 1 tablet (40 mg total) by mouth daily. 90 tablet 0   rosuvastatin (CRESTOR) 10 MG tablet Take 1 tablet (10 mg total) by mouth daily. 90 tablet 3   trolamine salicylate (ASPERCREME) 10 % cream Apply 1 application topically as needed for muscle pain.     No current facility-administered medications on file prior to visit.   Past Medical History:  Diagnosis Date   Abnormal EKG 06/2003   History of inverted T waves V1-V3. Normal 2D echo (07/23/2003): LVEF 65%.   Anemia    BL Hgb 11-12. Ferritin 14 - low normal (08/2007). Colonoscopy 2009 - external hemorrhoids (excellent prep). Last anemia panel (12/2010) - Iron  24, TIBC 269,  B12  316, Folate 11.9, Ferritin 73.   Asthma    B12 deficiency    Back pain    Chest pain    Degenerative joint disease    BL knees (L>R), lumbar spine. Followed by Sports Medicine, Dr. Jennette Haney.   Dyspnea    History of multiple pulmonary nodules    Incidental finding: CT Abd/ Pelvis (04/2010) - Several small lower lobe lung nodules, including one pure ground-glass pulmonary nodule measuring 8 mm in the left lower lobe.  Recommend follow-up chest CT (IV contrast preferred) in 6 months to document stability. //  CT Abd/ Pelvis (07/2010) -  3 mm RLL and  8 mm LLL nodule stable.  Other nodules unchanged, likely benign.   Hyperlipidemia    Hypertension    Incarcerated ventral hernia 04/2010   Noted on CT Abd/ pelvis (04/2010). Patient now s/p ventral hernia repair by Dr. Gerrit Haney (12/2010)   Insomnia    Knee osteoarthritis    s/p Left total knee replacement (06/2011)   Left hip pain    Left ventricular diastolic dysfunction    Leg edema    Lower extremity edema    Chronic. 2D echo (2005) - EF 65%.   Obesity    OSA (obstructive sleep apnea)    Palpitations    Positive D dimer    Pre-diabetes    Right knee pain    Allergies  Allergen Reactions   Ketorolac Tromethamine Shortness Of Breath and Palpitations   Atrovent [Ipratropium] Hives and Rash    Latex Rash and Other (See Comments)    NO POWDERED GLOVES!!!!   Tape Dermatitis    Irritates skin    Social History   Socioeconomic History   Marital status: Married    Spouse name: [name]   Number of children: 3   Years of education: Not on file   Highest education level: Some college, no degree  Occupational History   Occupation: Stay at home  Tobacco Use   Smoking status: Former    Packs/day: 0.50    Years: 20.00    Total pack years: 10.00    Types: Cigarettes  Quit date: 09/10/1998    Years since quitting: 23.3   Smokeless tobacco: Never   Tobacco comments:    66 yo   Vaping Use   Vaping Use: Never used  Substance and Sexual Activity   Alcohol use: No    Alcohol/week: 0.0 standard drinks of alcohol   Drug use: No   Sexual activity: Yes    Partners: Male  Other Topics Concern   Not on file  Social History Narrative   10/05/2018: Lives with husband, daughter, and 2 grandchildren in [place] house. Lives on main level though mostly.   Has three children altogether, all local, supportive   Currently getting ramp installed in house d/t pt difficulty getting upstairs.      Social Determinants of Health   Financial Resource Strain: Low Risk  (06/12/2021)   Overall Financial Resource Strain (CARDIA)    Difficulty of Paying Living Expenses: Not very hard  Food Insecurity: No Food Insecurity (06/12/2021)   Hunger Vital Sign    Worried About Running Out of Food in the Last Year: Never true    Ran Out of Food in the Last Year: Never true  Transportation Needs: Unmet Transportation Needs (06/12/2021)   PRAPARE - Transportation    Lack of Transportation (Medical): No    Lack of Transportation (Non-Medical): Yes  Physical Activity: Unknown (06/12/2021)   Exercise Vital Sign    Days of Exercise per Week: Patient refused    Minutes of Exercise per Session: 10 min  Stress: No Stress Concern Present (06/12/2021)   [employer] of Occupational Health - Occupational Stress  Questionnaire    Feeling of Stress : Only a little  Social Connections: Moderately Integrated (06/12/2021)   Social Connection and Isolation Panel [NHANES]    Frequency of Communication with Haney and Family: More than three times a week    Frequency of Social Gatherings with Haney and Family: Once a week    Attends Religious Services: More than 4 times per year    Active Member of Clubs or Organizations: No    Attends Banker Meetings: Never    Marital Status: Married   Vitals:   01/18/22 1513  BP: 136/80  Pulse: 73  Resp: 12  SpO2: 98%   Wt Readings from Last 3 Encounters:  01/19/22 (!) 305 lb 8 oz (138.6 kg)  01/18/22 (!) 307 lb 2 oz (139.3 kg)  10/12/21 (!) 325 lb (147.4 kg)  Body mass index is 51.11 kg/m.  Physical Exam Vitals and nursing note reviewed.  Constitutional:      General: She is not in acute distress.    Appearance: She is well-developed.  HENT:     Head: Normocephalic and atraumatic.     Mouth/Throat:     Mouth: Mucous membranes are moist.     Pharynx: Oropharynx is clear.  Eyes:     Conjunctiva/sclera: Conjunctivae normal.  Cardiovascular:     Rate and Rhythm: Normal rate and regular rhythm.     Pulses:          Dorsalis pedis pulses are 2+ on the right side and 2+ on the left side.     Heart sounds: No murmur heard.    Comments: Trace pitting LE edema, bilateral. Pulmonary:     Effort: Pulmonary effort is normal. No respiratory distress.     Breath sounds: Normal breath sounds.  Abdominal:     Palpations: Abdomen is soft. There is no mass.     Tenderness: There is no  abdominal tenderness.  Lymphadenopathy:     Cervical: No cervical adenopathy.  Skin:    General: Skin is warm.     Findings: No erythema or rash.  Neurological:     General: No focal deficit present.     Mental Status: She is alert and oriented to person, place, and time.     Cranial Nerves: No cranial nerve deficit.  Psychiatric:     Comments: Well groomed,  good eye contact.   ASSESSMENT AND PLAN:   Ms.Taffy was seen today for follow-up.  Diagnoses and all orders for this visit:  Orders Placed This Encounter  Procedures   Basic metabolic panel   VITAMIN D 25 Hydroxy (Vit-D Deficiency, Fractures)   Lab Results  Component Value Date   CREATININE 0.76 01/18/2022   BUN 12 01/18/2022   NA 140 01/18/2022   K 4.2 01/18/2022   CL 107 01/18/2022   CO2 25 01/18/2022   Low back pain Stable otherwise. Continue gabapentin 100 mg 1 to 2 capsules at bedtime as needed. Following with orthopedics.  Essential hypertension BP adequately controlled. Continue lisinopril 40 mg daily. Low-salt/DASH diet to continue. Continue monitoring BP regularly.  Vitamin D deficiency Continue same dose of vitamin D supplementation. Further recommendation will be given according to 25 OH vitamin D result.  Hyperlipidemia Intermediate CVR based on ASCVD risk score (9.3%). She is not fasting today, so we will plan on checking fasting lipid panel next visit. Continue rosuvastatin 10 mg daily and low-fat diet.  Return in about 6 months (around 07/21/2022).  [name] G. [name], MD  [facility]. [place] office.

## 2022-01-18 ENCOUNTER — Encounter: Payer: Self-pay | Admitting: Family Medicine

## 2022-01-18 ENCOUNTER — Ambulatory Visit (INDEPENDENT_AMBULATORY_CARE_PROVIDER_SITE_OTHER): Payer: Medicare Other | Admitting: Family Medicine

## 2022-01-18 ENCOUNTER — Encounter: Payer: Medicare Other | Attending: Surgery | Admitting: Skilled Nursing Facility1

## 2022-01-18 VITALS — BP 136/80 | HR 73 | Resp 12 | Ht 65.0 in | Wt 307.1 lb

## 2022-01-18 DIAGNOSIS — E559 Vitamin D deficiency, unspecified: Secondary | ICD-10-CM | POA: Diagnosis not present

## 2022-01-18 DIAGNOSIS — Z713 Dietary counseling and surveillance: Secondary | ICD-10-CM | POA: Insufficient documentation

## 2022-01-18 DIAGNOSIS — I10 Essential (primary) hypertension: Secondary | ICD-10-CM | POA: Diagnosis not present

## 2022-01-18 DIAGNOSIS — E785 Hyperlipidemia, unspecified: Secondary | ICD-10-CM

## 2022-01-18 DIAGNOSIS — Z9884 Bariatric surgery status: Secondary | ICD-10-CM | POA: Insufficient documentation

## 2022-01-18 DIAGNOSIS — G8929 Other chronic pain: Secondary | ICD-10-CM

## 2022-01-18 DIAGNOSIS — M5441 Lumbago with sciatica, right side: Secondary | ICD-10-CM

## 2022-01-18 MED ORDER — GABAPENTIN 100 MG PO CAPS: 200.0000 mg | ORAL_CAPSULE | Freq: Every evening | ORAL | 1 refills | Status: DC | PRN

## 2022-01-18 MED ORDER — LISINOPRIL 40 MG PO TABS: 40.0000 mg | ORAL_TABLET | Freq: Every day | ORAL | 1 refills | Status: DC

## 2022-01-18 NOTE — Assessment & Plan Note (Signed)
Intermediate CVR based on ASCVD risk score (9.3%). She is not fasting today, so we will plan on checking fasting lipid panel next visit. Continue rosuvastatin 10 mg daily and low-fat diet.

## 2022-01-18 NOTE — Assessment & Plan Note (Signed)
BP adequately controlled. Continue lisinopril 40 mg daily. Low-salt/DASH diet to continue. Continue monitoring BP regularly.

## 2022-01-18 NOTE — Assessment & Plan Note (Signed)
Continue same dose of vitamin D supplementation. Further recommendation will be given according to 25 OH vitamin D result. 

## 2022-01-18 NOTE — Assessment & Plan Note (Addendum)
Stable otherwise. Continue gabapentin 100 mg 1 to 2 capsules at bedtime as needed. Following with orthopedics.

## 2022-01-18 NOTE — Patient Instructions (Signed)
A few things to remember from today's visit:  Essential hypertension - Plan: Basic metabolic panel  Vitamin D deficiency - Plan: VITAMIN D 25 Hydroxy (Vit-D Deficiency, Fractures)  If you need refills please call your pharmacy. Do not use My Chart to request refills or for acute issues that need immediate attention.   No changes today. Upper and lower body exercises for 10-15 min daily as tolerated.  Please be sure medication list is accurate. If a new problem present, please set up appointment sooner than planned today.

## 2022-01-19 ENCOUNTER — Encounter: Payer: Self-pay | Admitting: Skilled Nursing Facility1

## 2022-01-19 LAB — BASIC METABOLIC PANEL
BUN: 12 mg/dL (ref 6–23)
CO2: 25 mEq/L (ref 19–32)
Calcium: 9.9 mg/dL (ref 8.4–10.5)
Chloride: 107 mEq/L (ref 96–112)
Creatinine, Ser: 0.76 mg/dL (ref 0.40–1.20)
GFR: 82.57 mL/min (ref >60.00)
Glucose, Bld: 91 mg/dL (ref 70–99)
Potassium: 4.2 mEq/L (ref 3.5–5.1)
Sodium: 140 mEq/L (ref 135–145)

## 2022-01-19 LAB — VITAMIN D 25 HYDROXY (VIT D DEFICIENCY, FRACTURES): VITD: 35.77 ng/mL (ref 30.00–100.00)

## 2022-01-19 NOTE — Progress Notes (Signed)
Follow-up visit:  Post-Operative sleeve  Surgery  Medical Nutrition Therapy:  Appt start time: 6:00pm end time:  7:00pm  Primary concerns today: Post-operative Bariatric Surgery Nutrition Management 6 Month Post-Op Class  Sleeve gastrectomy  Surgery date: 08/16/2021 Surgery type: Sleeve Start weight at [facility]: 352.6 Weight today: 305.5 pounds  Clinical  Medical hx: Prediabetes, sleep apnea Medications: ozempic Labs: vitmain D 27.67, HDL 36.20, LDL 106, B12 1504 Notable signs/symptoms: limited mobility due to hip pain Any previous deficiencies? Yes, vitamin B12 , vitamin D   Body Composition Scale 01/19/2022  Current Body Weight 305.5  Total Body Fat % 50.8  Visceral Fat 25  Fat-Free Mass % 49.1   Total Body Water % 39  Muscle-Mass lbs 30.5  BMI 50.5  Body Fat Displacement          Torso  lbs 96.4         Left Leg  lbs 19.2         Right Leg  lbs 19.2         Left Arm  lbs 9.6         Right Arm   lbs 9.6       Information Reviewed/ Discussed During Appointment: -Review of composition scale numbers -Fluid requirements (64-100 ounces) -Protein requirements (60-80g) -Strategies for tolerating diet -Advancement of diet to include Starchy vegetables -Barriers to inclusion of new foods -Inclusion of appropriate multivitamin and calcium supplements  -Exercise recommendations   Fluid intake: adequate   Medications: See List Supplementation: appropriate    Using straws: no Drinking while eating: no Having you been chewing well: yes Chewing/swallowing difficulties: no Changes in vision: no Changes to mood/headaches: no Hair loss/Cahnges to skin/Changes to nails: no Any difficulty focusing or concentrating: no Sweating: no Dizziness/Lightheaded: no Palpitations: no  Carbonated beverages: no N/V/D/C/GAS: no Abdominal Pain: no Dumping syndrome: no  Recent physical activity:  ADL's  Progress Towards Goal(s):  In Progress Teaching method utilized: Visual &  Auditory  Demonstrated degree of understanding via: Teach Back  Readiness Level: Action Barriers to learning/adherence to lifestyle change: none identified  Handouts given during visit include: Phase V diet Progression  Goals Sheet The Benefits of Exercise are endless..... Support Group Topics   Teaching Method Utilized:  Visual Auditory Hands on  Demonstrated degree of understanding via:  Teach Back   Monitoring/Evaluation:  Dietary intake, exercise, and body weight. Follow up in 3 months for 9 month post-op visit.

## 2022-01-24 ENCOUNTER — Other Ambulatory Visit: Payer: Self-pay | Admitting: Orthopedic Surgery

## 2022-01-24 DIAGNOSIS — H0102B Squamous blepharitis left eye, upper and lower eyelids: Secondary | ICD-10-CM | POA: Diagnosis not present

## 2022-01-24 DIAGNOSIS — H25813 Combined forms of age-related cataract, bilateral: Secondary | ICD-10-CM | POA: Diagnosis not present

## 2022-01-24 DIAGNOSIS — M25552 Pain in left hip: Secondary | ICD-10-CM

## 2022-01-24 DIAGNOSIS — H0102A Squamous blepharitis right eye, upper and lower eyelids: Secondary | ICD-10-CM | POA: Diagnosis not present

## 2022-01-24 DIAGNOSIS — H04123 Dry eye syndrome of bilateral lacrimal glands: Secondary | ICD-10-CM | POA: Diagnosis not present

## 2022-01-27 ENCOUNTER — Ambulatory Visit
Admission: RE | Admit: 2022-01-27 | Discharge: 2022-01-27 | Disposition: A | Discharge status: Home / Self Care | Payer: Medicare Other | Source: Ambulatory Visit | Attending: Orthopedic Surgery | Admitting: Orthopedic Surgery

## 2022-01-27 DIAGNOSIS — M25552 Pain in left hip: Secondary | ICD-10-CM

## 2022-01-27 DIAGNOSIS — M1612 Unilateral primary osteoarthritis, left hip: Secondary | ICD-10-CM | POA: Diagnosis not present

## 2022-01-27 MED ORDER — IOPAMIDOL (ISOVUE-M 200) INJECTION 41%
1.0000 mL | Freq: Once | INTRAMUSCULAR | Status: AC
  Administered 2022-01-27: 1 mL via INTRA_ARTICULAR

## 2022-01-27 MED ORDER — METHYLPREDNISOLONE ACETATE 40 MG/ML INJ SUSP (RADIOLOG
80.0000 mg | Freq: Once | INTRAMUSCULAR | Status: AC
  Administered 2022-01-27: 80 mg via INTRA_ARTICULAR

## 2022-02-09 ENCOUNTER — Telehealth: Payer: Self-pay | Admitting: Pharmacist

## 2022-02-09 NOTE — Chronic Care Management (AMB) (Signed)
Chronic Care Management Pharmacy Assistant   Name: [name]  MRN: [id number] DOB: [birth year]  Reason for Encounter: Disease State Hypertension Assessment   Conditions to be addressed/monitored: HTN  Recent office visits:  01/18/22 [name], [name], MD - Patient presented for Essential hypertension and other concerns. Changed Gabapentin to PRN  Recent consult visits:  01/18/22 [name], RD (Nutritionist) - Patient presented for Morbid obesity with BMI of 50.0-59.9 adult. No medication changes.  Hospital visits:  Medication Reconciliation was completed by comparing discharge summary, patient's EMR and Pharmacy list, and upon discussion with patient. Patient presented to [facility] due to Syncope. Patient was present for 25 hours.   New?Medications Started at [facility] Discharge:?? -started  Maalox Max (alum & mag hydroxide-simeth) potassium chloride SA (KLOR-CON M)  Medication Changes at Hospital Discharge: -Changed  none  Medications Discontinued at Hospital Discharge: -Stopped  diclofenac 75 MG EC tablet (VOLTAREN) hydrochlorothiazide 25 MG tablet (HYDRODIURIL) HYDROcodone-acetaminophen 5-325 MG tablet (NORCO/VICODIN) lisinopril 40 MG tablet (ZESTRIL) oxyCODONE 5 MG immediate release tablet (Oxy IR/ROXICODONE) tiZANidine 4 MG tablet (Zanaflex  Medications that remain the same after Hospital Discharge:??  -All other medications will remain the same.    Medications: Outpatient Encounter Medications as of 02/09/2022  Medication Sig   albuterol (VENTOLIN HFA) 108 (90 Base) MCG/ACT inhaler Inhale 1-2 puffs into the lungs every 6 (six) hours as needed for wheezing or shortness of breath.   alum & mag hydroxide-simeth (MAALOX MAX) 400-400-40 MG/5ML suspension Take 15 mLs by mouth every 6 (six) hours as needed for indigestion.   budesonide-formoterol (SYMBICORT) 160-4.5 MCG/ACT inhaler Inhale 2 puffs into the lungs 2 (two) times daily. (Patient  taking differently: Inhale 2 puffs into the lungs daily as needed (Wheezing and Asthma).)   calcium carbonate (OS-CAL - DOSED IN MG OF ELEMENTAL CALCIUM) 1250 (500 Ca) MG tablet Take 1 tablet by mouth 2 (two) times daily with a meal.   Cholecalciferol (VITAMIN D) 50 MCG (2000 UT) CAPS Take 2,000 Units by mouth daily.   diclofenac Sodium (VOLTAREN) 1 % GEL Apply 2 Haney topically 4 (four) times daily.   gabapentin (NEURONTIN) 100 MG capsule Take 2 capsules (200 mg total) by mouth at bedtime as needed.   lisinopril (ZESTRIL) 40 MG tablet Take 1 tablet (40 mg total) by mouth daily.   Multiple Vitamins-Minerals (BARIATRIC MULTIVITAMINS/IRON PO) Take 1 tablet by mouth daily.   ondansetron (ZOFRAN-ODT) 4 MG disintegrating tablet Take 1 tablet (4 mg total) by mouth every 6 (six) hours as needed for nausea or vomiting.   pantoprazole (PROTONIX) 40 MG tablet Take 1 tablet (40 mg total) by mouth daily.   rosuvastatin (CRESTOR) 10 MG tablet Take 1 tablet (10 mg total) by mouth daily.   trolamine salicylate (ASPERCREME) 10 % cream Apply 1 application topically as needed for muscle pain.   No facility-administered encounter medications on file as of 02/09/2022.   Reviewed chart prior to disease state call. Spoke with patient regarding BP  Recent Office Vitals: BP Readings from Last 3 Encounters:  01/18/22 136/80  10/01/21 128/68  09/13/21 136/70   Pulse Readings from Last 3 Encounters:  01/18/22 73  10/01/21 83  09/13/21 (!) 102    Wt Readings from Last 3 Encounters:  01/19/22 (!) 305 lb 8 oz (138.6 kg)  01/18/22 (!) 307 lb 2 oz (139.3 kg)  10/12/21 (!) 325 lb (147.4 kg)     Kidney Function Lab Results  Component Value Date/Time  CREATININE 0.76 01/18/2022 04:28 PM   CREATININE 0.77 09/13/2021 11:08 AM   CREATININE 0.83 04/27/2020 01:07 PM   CREATININE 0.74 09/15/2017 03:19 PM   GFR 82.57 01/18/2022 04:28 PM   GFRNONAA >60 09/06/2021 03:52 AM   GFRNONAA 75 04/27/2020 01:07 PM   GFRAA 87  04/27/2020 01:07 PM       Latest Ref Rng & Units 01/18/2022    4:28 PM 09/13/2021   11:08 AM 09/06/2021    3:52 AM  BMP  Glucose 70 - 99 mg/dL 91  585  87   BUN 6 - 23 mg/dL 12  11  28    Creatinine 0.40 - 1.20 mg/dL  2.77  8.24   Sodium 135 - 145 mEq/L 140  139  139   Potassium 3.5 - 5.1 mEq/L 4.2  3.9  2.8   Chloride 96 - 112 mEq/L 107  105  105   CO2 19 - 32 mEq/L 25  24  28    Calcium 8.4 - 10.5 mg/dL 9.9  2.35  9.2     Current antihypertensive regimen:  Lisinopril 40 mg daily  - Appropriate, Query effective, Query Safe, Accessible How often are you checking your Blood Pressure? weekly Current home BP readings: 120/80 134/80 patient reports her readings do not exceed 138/80 she denies any hyper/hypotensive symptoms. What recent interventions/DTPs have been made by any provider to improve Blood Pressure control since last CPP Visit: Patient reports none Any recent hospitalizations or ED visits since last visit with CPP? Yes What diet changes have been made to improve Blood Pressure Control?  Patient has recently seen nutritionist and following recommendations given to her. What exercise is being done to improve your Blood Pressure Control?  Patient reports her hip has been bothering her still.  Adherence Review: Is the patient currently on ACE/ARB medication? Yes Does the patient have >5 day gap between last estimated fill dates? No    Care Gaps: Zoster Vaccine - Overdue COVID Booster - Overdue TDAP - Overdue Pap Smear - Overdue Flu Vaccine - Overdue CCM- 11/23 AWV- 8/22 BP- 136/80 01/18/22  Star Rating Drugs: Lisinopril (Zestril) 40 mg - Last filled 01/17/22 100 DS at [facility] Rosuvastatin (Crestor) 10 mg - Last filled 12/24/21 90 DS at [facility]    03/20/22 CMA Clinical Pharmacist Assistant [phone]

## 2022-02-16 ENCOUNTER — Encounter (INDEPENDENT_AMBULATORY_CARE_PROVIDER_SITE_OTHER): Payer: Self-pay

## 2022-03-02 ENCOUNTER — Ambulatory Visit: Payer: Medicare Other

## 2022-03-02 ENCOUNTER — Telehealth (INDEPENDENT_AMBULATORY_CARE_PROVIDER_SITE_OTHER): Payer: Medicare Other

## 2022-03-02 VITALS — Ht 65.0 in | Wt 307.0 lb

## 2022-03-02 DIAGNOSIS — Z0000 Encounter for general adult medical examination without abnormal findings: Secondary | ICD-10-CM | POA: Diagnosis not present

## 2022-03-02 NOTE — Progress Notes (Signed)
Subjective:   [name] is a 66 y.o. female who presents for Medicare Annual (Subsequent) preventive examination.  Review of Systems    Virtual Visit via Telephone Note  I connected with  [name] on 03/02/22 at  1:00 PM EDT by telephone and verified that I am speaking with the correct person using two identifiers.  Location: Patient: Home Provider: Office Persons participating in the virtual visit: patient/Nurse Health Advisor   I discussed the limitations, risks, security and privacy concerns of performing an evaluation and management service by telephone and the availability of in person appointments. The patient expressed understanding and agreed to proceed.  Interactive audio and video telecommunications were attempted between this nurse and patient, however failed, due to patient having technical difficulties OR patient did not have access to video capability.  We continued and completed visit with audio only.  Some vital signs may be absent or patient reported.   Holly Rung, LPN  Cardiac Risk Factors include: advanced age (>34men, >33 women);hypertension;obesity (BMI >30kg/m2)     Objective:    Today's Vitals   03/02/22 1305  Weight: (!) 307 lb (139.3 kg)  Height:  (1.651 m)   Body mass index is 51.09 kg/m.     03/02/2022    1:19 PM 09/05/2021   10:33 PM 09/05/2021    3:58 PM 08/18/2021    6:00 PM 08/09/2021   11:02 AM 04/23/2021   11:55 AM 02/17/2021    1:12 PM  Advanced Directives  Does Patient Have a Medical Advance Directive? No No No No No No No  Would patient like information on creating a medical advance directive? No - Patient declined No - Patient declined  No - Patient declined  No - Patient declined Yes (MAU/Ambulatory/Procedural Areas - Information given)    Current Medications (verified) Outpatient Encounter Medications as of 03/02/2022  Medication Sig   albuterol (VENTOLIN HFA) 108 (90 Base) MCG/ACT inhaler Inhale 1-2 puffs into  the lungs every 6 (six) hours as needed for wheezing or shortness of breath.   alum & mag hydroxide-simeth (MAALOX MAX) 400-400-40 MG/5ML suspension Take 15 mLs by mouth every 6 (six) hours as needed for indigestion.   budesonide-formoterol (SYMBICORT) 160-4.5 MCG/ACT inhaler Inhale 2 puffs into the lungs 2 (two) times daily. (Patient taking differently: Inhale 2 puffs into the lungs daily as needed (Wheezing and Asthma).)   calcium carbonate (OS-CAL - DOSED IN MG OF ELEMENTAL CALCIUM) 1250 (500 Ca) MG tablet Take 1 tablet by mouth 2 (two) times daily with a meal.   Cholecalciferol (VITAMIN D) 50 MCG (2000 UT) CAPS Take 2,000 Units by mouth daily.   diclofenac Sodium (VOLTAREN) 1 % GEL Apply 2 g topically 4 (four) times daily.   gabapentin (NEURONTIN) 100 MG capsule Take 2 capsules (200 mg total) by mouth at bedtime as needed.   lisinopril (ZESTRIL) 40 MG tablet Take 1 tablet (40 mg total) by mouth daily.   Multiple Vitamins-Minerals (BARIATRIC MULTIVITAMINS/IRON PO) Take 1 tablet by mouth daily.   ondansetron (ZOFRAN-ODT) 4 MG disintegrating tablet Take 1 tablet (4 mg total) by mouth every 6 (six) hours as needed for nausea or vomiting.   pantoprazole (PROTONIX) 40 MG tablet Take 1 tablet (40 mg total) by mouth daily.   rosuvastatin (CRESTOR) 10 MG tablet Take 1 tablet (10 mg total) by mouth daily.   trolamine salicylate (ASPERCREME) 10 % cream Apply 1 application topically as needed for muscle pain.   No facility-administered encounter medications  on file as of 03/02/2022.    Allergies (verified) Ketorolac tromethamine, Atrovent [ipratropium], Latex, and Tape   History: Past Medical History:  Diagnosis Date   Abnormal EKG 06/2003   History of inverted T waves V1-V3. Normal 2D echo (07/23/2003): LVEF 65%.   Anemia    BL Hgb 11-12. Ferritin 14 - low normal (08/2007). Colonoscopy 2009 - external hemorrhoids (excellent prep). Last anemia panel (12/2010) - Iron  24, TIBC 269,  B12  316, Folate  11.9, Ferritin 73.   Asthma    B12 deficiency    Back pain    Chest pain    Degenerative joint disease    BL knees (L>R), lumbar spine. Followed by Sports Medicine, Dr. Jennette KettleNeal.   Dyspnea    Gastric bypass status for obesity    History of multiple pulmonary nodules    Incidental finding: CT Abd/ Pelvis (04/2010) - Several small lower lobe lung nodules, including one pure ground-glass pulmonary nodule measuring 8 mm in the left lower lobe.  Recommend follow-up chest CT (IV contrast preferred) in 6 months to document stability. //  CT Abd/ Pelvis (07/2010) -  3 mm RLL and  8 mm LLL nodule stable.  Other nodules unchanged, likely benign.   Hyperlipidemia    Hypertension    Incarcerated ventral hernia 04/2010   Noted on CT Abd/ pelvis (04/2010). Patient now s/p ventral hernia repair by Dr. Gerrit FriendsGerkin (12/2010)   Insomnia    Knee osteoarthritis    s/p Left total knee replacement (06/2011)   Left hip pain    Left ventricular diastolic dysfunction    Leg edema    Lower extremity edema    Chronic. 2D echo (2005) - EF 65%.   Obesity    OSA (obstructive sleep apnea)    Palpitations    Positive D dimer    Pre-diabetes    Right knee pain    Past Surgical History:  Procedure Laterality Date   BIOPSY  04/23/2021   Procedure: BIOPSY;  Surgeon: Quentin OreStechschulte, Paul J, MD;  Location: [facility];  Service: General;;   COLONOSCOPY  2009   COLONOSCOPY WITH PROPOFOL N/A 12/18/2017   Procedure: COLONOSCOPY WITH PROPOFOL;  Surgeon: Iva BoopGessner, Carl E, MD;  Location: [facility];  Service: Endoscopy;  Laterality: N/A;   ESOPHAGOGASTRODUODENOSCOPY N/A 04/23/2021   Procedure: ESOPHAGOGASTRODUODENOSCOPY (EGD);  Surgeon: Quentin OreStechschulte, Paul J, MD;  [facility];  Service: General;  Laterality: N/A;   FOOT SURGERY  2004   left   GASTRIC BYPASS     INCISION AND DRAINAGE ABSCESS ANAL  07/2008   I&D and debridgement of anorectal abscess   INCISIONAL HERNIA REPAIR  12/2010   Repair of incarcerated  ventral incisional hernia with Ethicon mesh patch - performed by Dr. Gerrit FriendsGerkin.    INGUINAL HERNIA REPAIR     JOINT REPLACEMENT Left 2013   left knee   TOTAL KNEE ARTHROPLASTY  06/16/2011   Left TOTAL KNEE ARTHROPLASTY;  Surgeon: Kennieth RadArthur F Carter;  Location: [facility];  Service: Orthopedics;  Laterality: Left;  left total knee arthroplasty   TUBAL LIGATION     UPPER GI ENDOSCOPY N/A 08/16/2021   Procedure: UPPER GI ENDOSCOPY;  Surgeon: Quentin OreStechschulte, Paul J, MD;  Location: [facility];  Service: General;  Laterality: N/A;   Family History  Problem Relation Age of Onset   Diabetes Mother    Hypertension Mother    Stroke Mother    Hyperlipidemia Mother    Heart disease Mother    Sudden death Mother  Kidney disease Mother    Depression Mother    Dementia Father    Heart disease Father    Hyperlipidemia Father    Sudden death Father    Depression Father    Hypertension Sister    Diabetes Brother    Hypertension Brother    Rashes / Skin problems Maternal Grandfather    Heart attack Neg Hx    Social History   Socioeconomic History   Marital status: Married    Spouse name: Tax adviser   Number of children: 3   Years of education: Not on file   Highest education level: Some college, no degree  Occupational History   Occupation: Stay at home  Tobacco Use   Smoking status: Former    Packs/day: 0.50    Years: 20.00    Total pack years: 10.00    Types: Cigarettes    Quit date: 09/10/1998    Years since quitting: 23.4   Smokeless tobacco: Never   Tobacco comments:    66 yo   Vaping Use   Vaping Use: Never used  Substance and Sexual Activity   Alcohol use: No    Alcohol/week: 0.0 standard drinks of alcohol   Drug use: No   Sexual activity: Yes    Partners: Male  Other Topics Concern   Not on file  Social History Narrative   10/05/2018: Lives with husband, daughter, and 2 grandchildren in [place] house. Lives on main level though mostly.   Has three children altogether, all local,  supportive   Currently getting ramp installed in house d/t pt difficulty getting upstairs.      Social Determinants of Health   Financial Resource Strain: Low Risk  (03/02/2022)   Overall Financial Resource Strain (CARDIA)    Difficulty of Paying Living Expenses: Not hard at all  Food Insecurity: No Food Insecurity (03/02/2022)   Hunger Vital Sign    Worried About Running Out of Food in the Last Year: Never true    Ran Out of Food in the Last Year: Never true  Transportation Needs: No Transportation Needs (03/02/2022)   PRAPARE - Administrator, Civil Service (Medical): No    Lack of Transportation (Non-Medical): No  Physical Activity: Unknown (06/12/2021)   Exercise Vital Sign    Days of Exercise per Week: Patient refused    Minutes of Exercise per Session: Not on file  Stress: No Stress Concern Present (03/02/2022)   [employer] of Occupational Health - Occupational Stress Questionnaire    Feeling of Stress : Not at all  Social Connections: Socially Integrated (03/02/2022)   Social Connection and Isolation Panel [NHANES]    Frequency of Communication with Friends and Family: More than three times a week    Frequency of Social Gatherings with Friends and Family: More than three times a week    Attends Religious Services: More than 4 times per year    Active Member of [employer] or Organizations: Yes    Attends Engineer, structural: More than 4 times per year    Marital Status: Married    Tobacco Counseling Counseling given: Not Answered Tobacco comments: 66 yo    Clinical Intake:  Pre-visit preparation completed: No Diabetic?  No  Interpreter Needed?: No Activities of Daily Living    03/02/2022    1:16 PM 09/05/2021   10:35 PM  In your present state of health, do you have any difficulty performing the following activities:  Hearing? 0   Vision? 0  Difficulty concentrating or making decisions? 0   Walking or climbing stairs? 1   Comment Use a  wlaker and w/c   Dressing or bathing? 0   Doing errands, shopping? 1 0  Comment Family Advice worker and eating ? N   Using the Toilet? N   In the past six months, have you accidently leaked urine? N   Do you have problems with loss of bowel control? N   Managing your Medications? N   Managing your Finances? N   Housekeeping or managing your Housekeeping? N     Patient Care Team: [name], [name], MD as PCP - General (Family Medicine) Quintella Reichert, MD as PCP - Cardiology (Cardiology) Verner Chol, [facility] as Pharmacist (Pharmacist)  Indicate any recent Medical Services you may have received from other than [facility] providers in the past year (date may be approximate).     Assessment:   This is a routine wellness examination for Zuleyka.  Hearing/Vision screen Hearing Screening - Comments:: No hearing difficulty Vision Screening - Comments:: Wears reading glasses. Followed by Dr Lavona Mound  Dietary issues and exercise activities discussed: Exercise limited by: orthopedic condition(s)   Goals Addressed               This Visit's Progress     Weight (lb) < 200 lb (90.7 kg) (pt-stated)         Depression Screen    03/02/2022    1:13 PM 03/02/2022    1:12 PM 01/18/2022    3:37 PM 09/13/2021   10:32 AM 02/17/2021    1:11 PM 02/12/2020    9:46 AM 10/05/2018    4:14 PM  PHQ 2/9 Scores  PHQ - 2 Score 0 0 2 0 0 0 2  PHQ- 9 Score 0 0 4 2   8     Fall Risk    03/02/2022    1:19 PM 01/18/2022    3:37 PM 09/13/2021   10:32 AM 02/17/2021    1:13 PM 02/12/2020    9:50 AM  Fall Risk   Falls in the past year? 0 0 1 0 0  Number falls in past yr: 0 0 1 0 0  Injury with Fall? 0 0 0 0 0  Risk for fall due to : No Fall Risks History of fall(s)  Impaired vision;Impaired mobility;Impaired balance/gait Impaired balance/gait;Impaired mobility;Impaired vision  Follow up  Falls evaluation completed  Falls prevention discussed Falls prevention discussed    FALL RISK PREVENTION PERTAINING  TO THE HOME:  Any stairs in or around the home? Yes  If so, are there any without handrails? No  Home free of loose throw rugs in walkways, pet beds, electrical cords, etc? Yes  Adequate lighting in your home to reduce risk of falls? Yes   ASSISTIVE DEVICES UTILIZED TO PREVENT FALLS:  Life alert? No  Use of a cane, walker or w/c? Yes  Grab bars in the bathroom? No  Shower chair or bench in shower? No  Elevated toilet seat or a handicapped toilet? Yes   TIMED UP AND GO:  Was the test performed? No . Audio Visit   Cognitive Function:        03/02/2022    1:20 PM 02/17/2021    1:16 PM 02/12/2020    9:57 AM  6CIT Screen  What Year? 0 points 0 points 0 points  What month? 0 points 0 points 0 points  What time? 0 points 0 points   Count  back from 20 0 points 0 points 0 points  Months in reverse 0 points 0 points 0 points  Repeat phrase 0 points 2 points 6 points  Total Score 0 points 2 points     Immunizations Immunization History  Administered Date(s) Administered   Influenza Split 03/29/2012   Influenza,inj,Quad PF,6+ Mos 04/09/2013, 09/23/2014, 05/04/2015, 04/04/2016, 03/20/2017, 05/02/2018, 04/27/2020   Moderna Sars-Covid-2 Vaccination 05/07/2020, 06/07/2020   Pneumococcal Polysaccharide-23 03/29/2012, 04/04/2016   Tdap 10/14/2010    TDAP status: Due, Education has been provided regarding the importance of this vaccine. Advised may receive this vaccine at local pharmacy or Health Dept. Aware to provide a copy of the vaccination record if obtained from local pharmacy or Health Dept. Verbalized acceptance and understanding.  Flu Vaccine status: Due, Education has been provided regarding the importance of this vaccine. Advised may receive this vaccine at local pharmacy or Health Dept. Aware to provide a copy of the vaccination record if obtained from local pharmacy or Health Dept. Verbalized acceptance and understanding.    Covid-19 vaccine status: Completed  vaccines  Qualifies for Shingles Vaccine? Yes   Zostavax completed No   Shingrix Completed?: No.    Education has been provided regarding the importance of this vaccine. Patient has been advised to call insurance company to determine out of pocket expense if they have not yet received this vaccine. Advised may also receive vaccine at local pharmacy or Health Dept. Verbalized acceptance and understanding.  Screening Tests Health Maintenance  Topic Date Due   PAP SMEAR-Modifier  01/20/2022   INFLUENZA VACCINE  02/08/2022   COVID-19 Vaccine (3 - Moderna series) 03/18/2022 (Originally 08/02/2020)   Zoster Vaccines- Shingrix (1 of 2) 06/02/2022 (Originally 03/25/2007)   TETANUS/TDAP  03/03/2023 (Originally 10/13/2020)   MAMMOGRAM  05/01/2023   COLONOSCOPY (Pts 45-95yrs Insurance coverage will need to be confirmed)  12/19/2027   Hepatitis C Screening  Completed   HIV Screening  Completed   HPV VACCINES  Aged Out    Health Maintenance  Health Maintenance Due  Topic Date Due   PAP SMEAR-Modifier  01/20/2022   INFLUENZA VACCINE  02/08/2022    Colorectal cancer screening: Type of screening: Colonoscopy. Completed 12/18/17. Repeat every 10 years  Mammogram status: Completed 04/30/21. Repeat every year    Lung Cancer Screening: (Low Dose CT Chest recommended if Age 69-80 years, 30 pack-year currently smoking OR have quit w/in 15years.) does not qualify.     Additional Screening:  Hepatitis C Screening: does qualify; Completed 08/18/16  Vision Screening: Recommended annual ophthalmology exams for early detection of glaucoma and other disorders of the eye. Is the patient up to date with their annual eye exam?  Yes  Who is the provider or what is the name of the office in which the patient attends annual eye exams? Dr Lavona Mound If pt is not established with a provider, would they like to be referred to a provider to establish care? No .   Dental Screening: Recommended annual dental exams for  proper oral hygiene  Community Resource Referral / Chronic Care Management:  CRR required this visit?  No   CCM required this visit?  No      Plan:     I have personally reviewed and noted the following in the patient's chart:   Medical and social history Use of alcohol, tobacco or illicit drugs  Current medications and supplements including opioid prescriptions. Patient is not currently taking opioid prescriptions. Functional ability and status Nutritional status Physical activity Advanced  directives List of other physicians Hospitalizations, surgeries, and ER visits in previous 12 months Vitals Screenings to include cognitive, depression, and falls Referrals and appointments  In addition, I have reviewed and discussed with patient certain preventive protocols, quality metrics, and best practice recommendations. A written personalized care plan for preventive services as well as general preventive health recommendations were provided to patient.     Holly Rung, LPN   [id number]   Nurse Notes: None

## 2022-03-02 NOTE — Patient Instructions (Addendum)
[name] , Thank you for taking time to come for your Medicare Wellness Visit. I appreciate your ongoing commitment to your health goals. Please review the following plan we discussed and let me know if I can assist you in the future.   These are the goals we discussed:  Goals       LDL CALC < 130      lose weight      Patient Stated      Sits with your mother 10: 30 to 8:30  During the week May try Dr. Dalbert Garnet, [phone]         Patient Stated      [place] for joint pain relief      Patient Stated      Working on trying to get weight down       Weight (lb) < 200 lb (90.7 kg) (pt-stated)      Weight (lb) < 317 lb (143.8 kg)      Process of to the [facility] water aerobics        This is a list of the screening recommended for you and due dates:  Health Maintenance  Topic Date Due   Pap Smear  01/20/2022   Flu Shot  02/08/2022   COVID-19 Vaccine (3 - Moderna series) 03/18/2022*   Zoster (Shingles) Vaccine (1 of 2) 06/02/2022*   Tetanus Vaccine  03/03/2023*   Mammogram  05/01/2023   Colon Cancer Screening  12/19/2027   Hepatitis C Screening: USPSTF Recommendation to screen - Ages 18-79 yo.  Completed   HIV Screening  Completed   HPV Vaccine  Aged Out  *Topic was postponed. The date shown is not the original due date.   Advanced directives: No  Conditions/risks identified: None  Next appointment: Follow up in one year for your annual wellness visit     Preventive Care 65 Years and Older, Female Preventive care refers to lifestyle choices and visits with your health care provider that can promote health and wellness. What does preventive care include? A yearly physical exam. This is also called an annual well check. Dental exams once or twice a year. Routine eye exams. Ask your health care provider how often you should have your eyes checked. Personal lifestyle choices, including: Daily care of your teeth and gums. Regular  physical activity. Eating a healthy diet. Avoiding tobacco and drug use. Limiting alcohol use. Practicing safe sex. Taking low-dose aspirin every day. Taking vitamin and mineral supplements as recommended by your health care provider. What happens during an annual well check? The services and screenings done by your health care provider during your annual well check will depend on your age, overall health, lifestyle risk factors, and family history of disease. Counseling  Your health care provider may ask you questions about your: Alcohol use. Tobacco use. Drug use. Emotional well-being. Home and relationship well-being. Sexual activity. Eating habits. History of falls. Memory and ability to understand (cognition). Work and work Astronomer. Reproductive health. Screening  You may have the following tests or measurements: Height, weight, and BMI. Blood pressure. Lipid and cholesterol levels. These may be checked every 5 years, or more frequently if you are over 68 years old. Skin check. Lung cancer screening. You may have this screening every year starting at age 41 if you have a 30-pack-year history of smoking and currently smoke or have quit within the past 15 years. Fecal occult blood test (FOBT) of the stool.  You may have this test every year starting at age 34. Flexible sigmoidoscopy or colonoscopy. You may have a sigmoidoscopy every 5 years or a colonoscopy every 10 years starting at age 12. Hepatitis C blood test. Hepatitis B blood test. Sexually transmitted disease (STD) testing. Diabetes screening. This is done by checking your blood sugar (glucose) after you have not eaten for a while (fasting). You may have this done every 1-3 years. Bone density scan. This is done to screen for osteoporosis. You may have this done starting at age 39. Mammogram. This may be done every 1-2 years. Talk to your health care provider about how often you should have regular mammograms. Talk  with your health care provider about your test results, treatment options, and if necessary, the need for more tests. Vaccines  Your health care provider may recommend certain vaccines, such as: Influenza vaccine. This is recommended every year. Tetanus, diphtheria, and acellular pertussis (Tdap, Td) vaccine. You may need a Td booster every 10 years. Zoster vaccine. You may need this after age 73. Pneumococcal 13-valent conjugate (PCV13) vaccine. One dose is recommended after age 98. Pneumococcal polysaccharide (PPSV23) vaccine. One dose is recommended after age 26. Talk to your health care provider about which screenings and vaccines you need and how often you need them. This information is not intended to replace advice given to you by your health care provider. Make sure you discuss any questions you have with your health care provider. Document Released: 07/24/2015 Document Revised: 03/16/2016 Document Reviewed: 04/28/2015 Elsevier Interactive Patient Education  2017 [employer].  Fall Prevention in the Home Falls can cause injuries. They can happen to people of all ages. There are many things you can do to make your home safe and to help prevent falls. What can I do on the outside of my home? Regularly fix the edges of walkways and driveways and fix any cracks. Remove anything that might make you trip as you walk through a door, such as a raised step or threshold. Trim any bushes or trees on the path to your home. Use bright outdoor lighting. Clear any walking paths of anything that might make someone trip, such as rocks or tools. Regularly check to see if handrails are loose or broken. Make sure that both sides of any steps have handrails. Any raised decks and porches should have guardrails on the edges. Have any leaves, snow, or ice cleared regularly. Use sand or salt on walking paths during winter. Clean up any spills in your garage right away. This includes oil or grease  spills. What can I do in the bathroom? Use night lights. Install grab bars by the toilet and in the tub and shower. Do not use towel bars as grab bars. Use non-skid mats or decals in the tub or shower. If you need to sit down in the shower, use a plastic, non-slip stool. Keep the floor dry. Clean up any water that spills on the floor as soon as it happens. Remove soap buildup in the tub or shower regularly. Attach bath mats securely with double-sided non-slip rug tape. Do not have throw rugs and other things on the floor that can make you trip. What can I do in the bedroom? Use night lights. Make sure that you have a light by your bed that is easy to reach. Do not use any sheets or blankets that are too big for your bed. They should not hang down onto the floor. Have a firm chair that has  side arms. You can use this for support while you get dressed. Do not have throw rugs and other things on the floor that can make you trip. What can I do in the kitchen? Clean up any spills right away. Avoid walking on wet floors. Keep items that you use a lot in easy-to-reach places. If you need to reach something above you, use a strong step stool that has a grab bar. Keep electrical cords out of the way. Do not use floor polish or wax that makes floors slippery. If you must use wax, use non-skid floor wax. Do not have throw rugs and other things on the floor that can make you trip. What can I do with my stairs? Do not leave any items on the stairs. Make sure that there are handrails on both sides of the stairs and use them. Fix handrails that are broken or loose. Make sure that handrails are as long as the stairways. Check any carpeting to make sure that it is firmly attached to the stairs. Fix any carpet that is loose or worn. Avoid having throw rugs at the top or bottom of the stairs. If you do have throw rugs, attach them to the floor with carpet tape. Make sure that you have a light switch at the  top of the stairs and the bottom of the stairs. If you do not have them, ask someone to add them for you. What else can I do to help prevent falls? Wear shoes that: Do not have high heels. Have rubber bottoms. Are comfortable and fit you well. Are closed at the toe. Do not wear sandals. If you use a stepladder: Make sure that it is fully opened. Do not climb a closed stepladder. Make sure that both sides of the stepladder are locked into place. Ask someone to hold it for you, if possible. Clearly mark and make sure that you can see: Any grab bars or handrails. First and last steps. Where the edge of each step is. Use tools that help you move around (mobility aids) if they are needed. These include: Canes. Walkers. Scooters. Crutches. Turn on the lights when you go into a dark area. Replace any light bulbs as soon as they burn out. Set up your furniture so you have a clear path. Avoid moving your furniture around. If any of your floors are uneven, fix them. If there are any pets around you, be aware of where they are. Review your medicines with your doctor. Some medicines can make you feel dizzy. This can increase your chance of falling. Ask your doctor what other things that you can do to help prevent falls. This information is not intended to replace advice given to you by your health care provider. Make sure you discuss any questions you have with your health care provider. Document Released: 04/23/2009 Document Revised: 12/03/2015 Document Reviewed: 08/01/2014 Elsevier Interactive Patient Education  2017 [employer].

## 2022-03-04 ENCOUNTER — Other Ambulatory Visit: Payer: Self-pay | Admitting: Family Medicine

## 2022-03-04 DIAGNOSIS — I10 Essential (primary) hypertension: Secondary | ICD-10-CM

## 2022-03-05 DIAGNOSIS — Z20822 Contact with and (suspected) exposure to covid-19: Secondary | ICD-10-CM | POA: Diagnosis not present

## 2022-03-05 DIAGNOSIS — R0602 Shortness of breath: Secondary | ICD-10-CM | POA: Diagnosis not present

## 2022-03-05 DIAGNOSIS — I959 Hypotension, unspecified: Secondary | ICD-10-CM | POA: Diagnosis not present

## 2022-03-05 DIAGNOSIS — R55 Syncope and collapse: Secondary | ICD-10-CM | POA: Diagnosis not present

## 2022-03-05 DIAGNOSIS — R531 Weakness: Secondary | ICD-10-CM | POA: Diagnosis not present

## 2022-03-05 DIAGNOSIS — E860 Dehydration: Secondary | ICD-10-CM | POA: Diagnosis not present

## 2022-03-05 DIAGNOSIS — R42 Dizziness and giddiness: Secondary | ICD-10-CM | POA: Diagnosis not present

## 2022-04-20 ENCOUNTER — Encounter: Payer: Self-pay | Admitting: Skilled Nursing Facility1

## 2022-04-20 ENCOUNTER — Encounter: Payer: Medicare Other | Attending: Surgery | Admitting: Skilled Nursing Facility1

## 2022-04-20 DIAGNOSIS — R7303 Prediabetes: Secondary | ICD-10-CM | POA: Diagnosis not present

## 2022-04-20 DIAGNOSIS — G4730 Sleep apnea, unspecified: Secondary | ICD-10-CM | POA: Diagnosis not present

## 2022-04-20 DIAGNOSIS — H35013 Changes in retinal vascular appearance, bilateral: Secondary | ICD-10-CM | POA: Diagnosis not present

## 2022-04-20 DIAGNOSIS — H25813 Combined forms of age-related cataract, bilateral: Secondary | ICD-10-CM | POA: Diagnosis not present

## 2022-04-20 DIAGNOSIS — Z6841 Body Mass Index (BMI) 40.0 and over, adult: Secondary | ICD-10-CM | POA: Insufficient documentation

## 2022-04-20 DIAGNOSIS — H35413 Lattice degeneration of retina, bilateral: Secondary | ICD-10-CM | POA: Diagnosis not present

## 2022-04-20 DIAGNOSIS — H31093 Other chorioretinal scars, bilateral: Secondary | ICD-10-CM | POA: Diagnosis not present

## 2022-04-20 DIAGNOSIS — H35371 Puckering of macula, right eye: Secondary | ICD-10-CM | POA: Diagnosis not present

## 2022-04-20 DIAGNOSIS — Z9884 Bariatric surgery status: Secondary | ICD-10-CM | POA: Diagnosis not present

## 2022-04-20 DIAGNOSIS — E119 Type 2 diabetes mellitus without complications: Secondary | ICD-10-CM | POA: Diagnosis not present

## 2022-04-20 DIAGNOSIS — Z713 Dietary counseling and surveillance: Secondary | ICD-10-CM | POA: Diagnosis not present

## 2022-04-20 NOTE — Progress Notes (Signed)
Follow-up visit:  Post-Operative sleeve  Surgery  Primary concerns today: Post-operative Bariatric Surgery Nutrition Management   Sleeve gastrectomy  Surgery date: 08/16/2021 Surgery type: Sleeve Start weight at [facility]: 352.6 Weight today: 290.7 pounds  Clinical  Medical hx: Prediabetes, sleep apnea Medications: ozempic Labs:  Notable signs/symptoms: limited mobility due to hip pain Any previous deficiencies? Yes, vitamin B12 , vitamin D   Body Composition Scale 01/19/2022 04/20/2022  Current Body Weight 305.5 290.7  Total Body Fat % 50.8 50  Visceral Fat 25 24  Fat-Free Mass % 49.1 49.9   Total Body Water % 39 39.4  Muscle-Mass lbs 30.5 30.2  BMI 50.5 48  Body Fat Displacement           Torso  lbs 96.4 90.3         Left Leg  lbs 19.2 18         Right Leg  lbs 19.2 18         Left Arm  lbs 9.6 9.0         Right Arm   lbs 9.6 9.0    Pt arrives in a wheel chair stating she is still trying to qualify for a hip replacement but needs her BMI lowered.  Pt states she has been doing some work stretches 3 days a week.  24 hr recall:  First meal: yogurt and 2 boiled eggs or sausage egg and yogurt Snack:  Second meal: egg sausage and yogurt or salad: spring mix + vinaggarette Snack: peanut butter crackers or peanuts  Third meal: salad and chicken and cucumber   Beverages: water + flavored, water, soda    Fluid intake: adequate, 67 oz of water reported   Medications: See List Supplementation: appropriate    Using straws: no Drinking while eating: no Having you been chewing well: yes Chewing/swallowing difficulties: no Changes in vision: no Changes to mood/headaches: no Hair loss/Cahnges to skin/Changes to nails: no Any difficulty focusing or concentrating: no Sweating: no Dizziness/Lightheaded: no Palpitations: no  Carbonated beverages: no N/V/D/C/GAS: no Abdominal Pain: no Dumping syndrome: no  Recent physical activity:  ADL's  Progress Towards Goal(s):  In  Progress Teaching method utilized: Visual & Auditory  Demonstrated degree of understanding via: Teach Back  Readiness Level: Action Barriers to learning/adherence to lifestyle change: none identified  Goals: -do the salad and the breakfast meat -do your arm chair workouts 6 days a week and add cans of soup for weight -do not drink soda or eat packs of crackers -have a piece of fruit as a snack and limit nuts to 1/4 cup   Teaching Method Utilized:  Visual Auditory Hands on  Demonstrated degree of understanding via:  Teach Back   Monitoring/Evaluation:  Dietary intake, exercise, and body weight. Follow up in February

## 2022-05-20 ENCOUNTER — Other Ambulatory Visit: Payer: Self-pay | Admitting: Orthopedic Surgery

## 2022-05-20 DIAGNOSIS — M25552 Pain in left hip: Secondary | ICD-10-CM

## 2022-05-23 NOTE — Progress Notes (Signed)
Appointment cancelled - please disregard. 

## 2022-05-25 ENCOUNTER — Ambulatory Visit
Admission: RE | Admit: 2022-05-25 | Discharge: 2022-05-25 | Disposition: A | Discharge status: Home / Self Care | Payer: Medicare Other | Source: Ambulatory Visit | Attending: Orthopedic Surgery | Admitting: Orthopedic Surgery

## 2022-05-25 DIAGNOSIS — M25552 Pain in left hip: Secondary | ICD-10-CM

## 2022-05-25 DIAGNOSIS — M1612 Unilateral primary osteoarthritis, left hip: Secondary | ICD-10-CM | POA: Diagnosis not present

## 2022-05-25 MED ORDER — METHYLPREDNISOLONE ACETATE 40 MG/ML INJ SUSP (RADIOLOG
80.0000 mg | Freq: Once | INTRAMUSCULAR | Status: AC
  Administered 2022-05-25: 80 mg via INTRA_ARTICULAR

## 2022-05-25 MED ORDER — IOPAMIDOL (ISOVUE-M 200) INJECTION 41%
1.0000 mL | Freq: Once | INTRAMUSCULAR | Status: AC
  Administered 2022-05-25: 1 mL via INTRA_ARTICULAR

## 2022-05-27 ENCOUNTER — Telehealth: Payer: Self-pay | Admitting: Pharmacist

## 2022-05-27 NOTE — Chronic Care Management (AMB) (Signed)
    Chronic Care Management Pharmacy Assistant   Name: [name]  MRN: [id number] DOB: 12-19-1955  05/27/22 APPOINTMENT REMINDER    Patient was reminded to have all medications, supplements and any blood glucose and blood pressure readings available for review with Gaylord Shih, Pharm. D, for telephone visit on 05/30/22 at 2:45.    Care Gaps: COVID Booster  Flu Vaccine - Overdue PNA Vaccine - Overdue BP- 136/80 01/19/22 AWV- 03/02/22 Lab Results  Component Value Date   HGBA1C 5.4 08/09/2021    Star Rating Drug: Lisinopril (Zestril) 40 mg - Last filled 05/20/22 100 DS at [facility] Rosuvastatin (Crestor) 10 mg - Last filled 03/23/22 90 DS at [facility]     Medications: Outpatient Encounter Medications as of 05/27/2022  Medication Sig   albuterol (VENTOLIN HFA) 108 (90 Base) MCG/ACT inhaler Inhale 1-2 puffs into the lungs every 6 (six) hours as needed for wheezing or shortness of breath.   alum & mag hydroxide-simeth (MAALOX MAX) 400-400-40 MG/5ML suspension Take 15 mLs by mouth every 6 (six) hours as needed for indigestion.   budesonide-formoterol (SYMBICORT) 160-4.5 MCG/ACT inhaler Inhale 2 puffs into the lungs 2 (two) times daily. (Patient taking differently: Inhale 2 puffs into the lungs daily as needed (Wheezing and Asthma).)   calcium carbonate (OS-CAL - DOSED IN MG OF ELEMENTAL CALCIUM) 1250 (500 Ca) MG tablet Take 1 tablet by mouth 2 (two) times daily with a meal.   Cholecalciferol (VITAMIN D) 50 MCG (2000 UT) CAPS Take 2,000 Units by mouth daily.   diclofenac Sodium (VOLTAREN) 1 % GEL Apply 2 g topically 4 (four) times daily.   gabapentin (NEURONTIN) 100 MG capsule Take 2 capsules (200 mg total) by mouth at bedtime as needed.   lisinopril (ZESTRIL) 40 MG tablet TAKE 1 TABLET BY MOUTH  DAILY   Multiple Vitamins-Minerals (BARIATRIC MULTIVITAMINS/IRON PO) Take 1 tablet by mouth daily.   ondansetron (ZOFRAN-ODT) 4 MG disintegrating tablet Take 1 tablet (4 mg total) by mouth  every 6 (six) hours as needed for nausea or vomiting.   pantoprazole (PROTONIX) 40 MG tablet Take 1 tablet (40 mg total) by mouth daily.   rosuvastatin (CRESTOR) 10 MG tablet Take 1 tablet (10 mg total) by mouth daily.   trolamine salicylate (ASPERCREME) 10 % cream Apply 1 application topically as needed for muscle pain.   No facility-administered encounter medications on file as of 05/27/2022.      Pamala Duffel CMA Clinical Pharmacist Assistant [phone]

## 2022-05-30 ENCOUNTER — Ambulatory Visit (INDEPENDENT_AMBULATORY_CARE_PROVIDER_SITE_OTHER): Payer: Medicare Other | Admitting: Pharmacist

## 2022-05-30 DIAGNOSIS — I10 Essential (primary) hypertension: Secondary | ICD-10-CM

## 2022-05-30 DIAGNOSIS — E785 Hyperlipidemia, unspecified: Secondary | ICD-10-CM

## 2022-05-30 NOTE — Progress Notes (Signed)
Chronic Care Management Pharmacy Note  05/30/2022 Name:  [name] MRN:  [id number] DOB:  1956-01-26  Summary: LDL not ideally at goal < 100 Pt inquired about a new medication to replace gabapentin Pt checks BP at home irregularly   Recommendations/Changes made from today's visit: -Recommended to bring BP cuff to office visit to ensure accuracy -Recommended weekly BP monitoring at home -Recommended repeat lipid panel  Plan: Scheduled PCP follow up to address gabapentin BP assessment in 3 months Follow up in 6 months  Subjective: [name] is an 66 y.o. year old female who is a primary patient of [name], [name], MD.  The CCM team was consulted for assistance with disease management and care coordination needs.    Engaged with patient by telephone for follow up visit in response to provider referral for pharmacy case management and/or care coordination services.   Consent to Services:  The patient was given information about Chronic Care Management services, agreed to services, and gave verbal consent prior to initiation of services.  Please see initial visit note for detailed documentation.   Patient Care Team: [name], [name], MD as PCP - General (Family Medicine) Sueanne Margarita, MD as PCP - Cardiology (Cardiology) Viona Gilmore, [facility] as Pharmacist (Pharmacist)  Recent office visits: 03/02/22 Rolene Arbour, LPN: Patient presented for AWV.  01/18/22 [name], [name], MD - Patient presented for Essential hypertension and other concerns. Changed Gabapentin to PRN.  Recent consult visits: 04/20/22 [name], RD (Nutrition): Patient presented for nutrition counseling post bariatric surgery. Follow up in 4 months.  01/18/22 [name], RD (Nutrition): Patient presented for nutrition counseling post bariatric surgery.   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 Follow up in 6  months.  10/01/21 [name], NP (pulmonary): Patient presented for OSA follow up. Recommended use of CPAP nightly.  Hospital visits: 03/05/22  Patient presented to [facility] ED for hypotension and dehydration.   Objective:  Lab Results  Component Value Date   CREATININE 0.76 01/18/2022   BUN 12 01/18/2022   GFR 82.57 01/18/2022   GFRNONAA >60 09/06/2021   GFRAA 87 04/27/2020   NA 140 01/18/2022   K 4.2 01/18/2022   CALCIUM 9.9 01/18/2022   CO2 25 01/18/2022   GLUCOSE 91 01/18/2022    Lab Results  Component Value Date/Time   HGBA1C 5.4 08/09/2021 11:25 AM   HGBA1C 5.7 12/02/2019 09:13 AM   GFR 82.57 01/18/2022 04:28 PM   GFR 81.49 09/13/2021 11:08 AM   MICROALBUR 0.76 08/16/2007 09:19 PM    Last diabetic Eye exam: No results found for: "HMDIABEYEEXA"  Last diabetic Foot exam: No results found for: "HMDIABFOOTEX"   Lab Results  Component Value Date   CHOL 161 09/13/2021   HDL 36.20 (L) 09/13/2021   LDLCALC 106 (H) 09/13/2021   TRIG 97.0 09/13/2021   CHOLHDL 4 09/13/2021       Latest Ref Rng & Units 09/05/2021    4:30 PM 08/09/2021   11:25 AM 10/27/2020    3:47 PM  Hepatic Function  Total Protein 6.5 - 8.1 g/dL 7.2  7.6  7.3   Albumin 3.5 - 5.0 g/dL 3.9  3.9  4.4   AST 15 - 41 U/L _0 ALT 0 - 44 U/L _1 Alk Phosphatase 38 - 126 U/L 70  86  122   Total  Bilirubin 0.3 - 1.2 mg/dL 0.4  0.4  0.4     Lab Results  Component Value Date/Time   TSH 1.900 05/09/2018 10:07 AM   TSH 3.440 10/25/2017 08:59 AM   TSH 1.307 07/16/2014 12:02 PM   FREET4 1.20 05/09/2018 10:07 AM       Latest Ref Rng & Units 09/05/2021    4:30 PM 08/18/2021    4:26 AM 08/17/2021    4:45 AM  CBC  WBC 4.0 - 10.5 K/uL 6.0  7.9  10.5   Hemoglobin 12.0 - 15.0 g/dL 12.2  11.8  11.6   Hematocrit 36.0 - 46.0 % 38.4  38.1  37.2   Platelets 150 - 400 K/uL 390  296  329     Lab Results  Component Value Date/Time   VD25OH 35.77 01/18/2022 04:28 PM   VD25OH 27.67  (L) 09/13/2021 11:08 AM    Clinical ASCVD: No  The 10-year ASCVD risk score (Arnett DK, et al., 2019) is: 15.9%   Values used to calculate the score:     Age: 56 years     Sex: Female     Is Non-Hispanic African American: Yes     Diabetic: No     Tobacco smoker: No     Systolic Blood Pressure: 564 mmHg     Is BP treated: Yes     HDL Cholesterol: 36.2 mg/dL     Total Cholesterol: 161 mg/dL       03/02/2022    1:13 PM 03/02/2022    1:12 PM 01/18/2022    3:37 PM  Depression screen PHQ 2/9  Decreased Interest 0 0 2  Down, Depressed, Hopeless 0 0 0  PHQ - 2 Score 0 0 2  Altered sleeping 0 0 1  Tired, decreased energy 0 0 0  Change in appetite 0 0 0  Feeling bad or failure about yourself  0 0 0  Trouble concentrating 0 0 1  Moving slowly or fidgety/restless 0 0 0  Suicidal thoughts 0 0 0  PHQ-9 Score 0 0 4  Difficult doing work/chores Not difficult at all Not difficult at all Somewhat difficult     Social History   Tobacco Use  Smoking Status Former   Packs/day: 0.50   Years: 20.00   Total pack years: 10.00   Types: Cigarettes   Quit date: 09/10/1998   Years since quitting: 23.7  Smokeless Tobacco Never  Tobacco Comments   66 yo    BP Readings from Last 3 Encounters:  01/18/22 136/80  10/01/21 128/68  09/13/21 136/70   Pulse Readings from Last 3 Encounters:  01/18/22 73  10/01/21 83  09/13/21 (!) 102   Wt Readings from Last 3 Encounters:  04/20/22 290 lb 11.2 oz (131.9 kg)  03/02/22 (!) 307 lb (139.3 kg)  01/19/22 (!) 305 lb 8 oz (138.6 kg)   BMI Readings from Last 3 Encounters:  04/20/22 48.38 kg/m  03/02/22 51.09 kg/m  01/19/22 50.84 kg/m    Assessment/Interventions: Review of patient past medical history, allergies, medications, health status, including review of consultants reports, laboratory and other test data, was performed as part of comprehensive evaluation and provision of chronic care management services.   SDOH:  (Social Determinants of  Health) assessments and interventions performed: Yes SDOH Interventions    Flowsheet Row Chronic Care Management from 05/30/2022 in [place] at [place] from 03/02/2022 in [place] at [place] Management from 02/26/2020 in [place] at [employer] from  10/05/2018 in [place] at [employer] from 09/29/2017 in [place] at [place] from 09/27/2017 in [place] at [place] Interventions -- Intervention Not Indicated -- -- -- --  Housing Interventions -- Intervention Not Indicated -- -- -- --  Transportation Interventions -- Intervention Not Indicated Intervention Not Indicated -- -- --  Depression Interventions/Treatment  -- -- -- Patient refuses Treatment  [pt. sees dr. Leafy Ro for weight mgmt, does not want additional support at this time] Counseling Counseling  Financial Strain Interventions Intervention Not Indicated Intervention Not Indicated Intervention Not Indicated -- -- --  Stress Interventions -- Intervention Not Indicated -- -- -- --  Social Connections Interventions -- Intervention Not Indicated -- -- -- --      SDOH Screenings   Food Insecurity: No Food Insecurity (03/02/2022)  Housing: Low Risk  (03/02/2022)  Transportation Needs: No Transportation Needs (03/02/2022)  Alcohol Screen: Low Risk  (03/02/2022)  Depression (PHQ2-9): Low Risk  (03/02/2022)  Financial Resource Strain: Low Risk  (05/30/2022)  Physical Activity: Unknown (06/12/2021)  Social Connections: Socially Integrated (03/02/2022)  Stress: No Stress Concern Present (03/02/2022)  Tobacco Use: Medium Risk (04/20/2022)    [place]  Allergies  Allergen Reactions   Ketorolac Tromethamine Shortness Of Breath and Palpitations   Atrovent [Ipratropium] Hives and Rash   Latex Rash and Other (See Comments)    NO POWDERED GLOVES!!!!   Tape  Dermatitis    Irritates skin     Medications Reviewed Today     Reviewed by Viona Gilmore, [facility] (Pharmacist) on 05/30/22 at 1500  Med List Status: <None>   Medication Order Taking? Sig Documenting Provider Last Dose Status Informant  albuterol (VENTOLIN HFA) 108 (90 Base) MCG/ACT inhaler [id number]  Inhale 1-2 puffs into the lungs every 6 (six) hours as needed for wheezing or shortness of breath. [name], [name], MD  Active Self  alum & mag hydroxide-simeth (MAALOX MAX) 970-263-78 MG/5ML suspension [id number]  Take 15 mLs by mouth every 6 (six) hours as needed for indigestion. Aline August, MD  Active   budesonide-formoterol [facility]) 160-4.5 MCG/ACT inhaler [id number]  Inhale 2 puffs into the lungs 2 (two) times daily.  Patient taking differently: Inhale 2 puffs into the lungs daily as needed (Wheezing and Asthma).   [name], [name], MD  Active Self  calcium carbonate (OS-CAL - DOSED IN MG OF ELEMENTAL CALCIUM) 1250 (500 Ca) MG tablet [id number]  Take 1 tablet by mouth 2 (two) times daily with a meal. [provider]  Active Self  Cholecalciferol (VITAMIN D) 50 MCG (2000 UT) CAPS [id number]  Take 2,000 Units by mouth daily. [provider]  Active Self  diclofenac Sodium (VOLTAREN) 1 % GEL [id number]  Apply 2 g topically 4 (four) times daily. [provider]  Active   gabapentin (NEURONTIN) 100 MG capsule [id number]  Take 2 capsules (200 mg total) by mouth at bedtime as needed. [name], [name], MD  Active   lisinopril (ZESTRIL) 40 MG tablet [id number] Yes TAKE 1 TABLET BY MOUTH  DAILY [name], [name], MD Taking Active   Multiple Vitamins-Minerals (BARIATRIC MULTIVITAMINS/IRON PO) [id number]  Take 1 tablet by mouth daily. [provider]  Active Self  ondansetron (ZOFRAN-ODT) 4 MG disintegrating tablet [id number]  Take 1 tablet (4 mg total) by mouth every 6 (six) hours as needed for nausea or vomiting. Stechschulte, Nickola Major, MD  Active Self  pantoprazole  (PROTONIX) 40  MG tablet [id number]  Take 1 tablet (40 mg total) by mouth daily. Stechschulte, Nickola Major, MD  Active Self  rosuvastatin (CRESTOR) 10 MG tablet [id number] Yes Take 1 tablet (10 mg total) by mouth daily. [name], [name], MD Taking Active   trolamine salicylate (ASPERCREME) 10 % cream [id number]  Apply 1 application topically as needed for muscle pain. [provider]  Active Self            Patient Active Problem List   Diagnosis Date Noted   Hypokalemia 09/06/2021   AKI (acute kidney injury) ([place]) 09/06/2021   Syncope 09/05/2021   S/P laparoscopic sleeve gastrectomy 09/05/2021   Morbid obesity with BMI of 50.0-59.9, adult ([place]) 08/16/2021   Radiculopathy due to lumbar intervertebral disc disorder 12/02/2019   Depression 12/11/2018   Vitamin D deficiency 07/18/2018   Prediabetes 07/18/2018   Morbid obesity ([place]) 07/18/2018   Insomnia 11/06/2017   Chest pain 10/25/2017   Asthma in adult, mild intermittent, uncomplicated 99/83/3825   Hypertension 10/25/2017   Obstructive sleep apnea 10/25/2017   Left ventricular diastolic dysfunction 05/39/7673   Positive D dimer 10/25/2017   B12 deficiency 10/25/2017   Pain due to total left knee replacement ([place]) 10/21/2016   B12 nutritional deficiency 09/27/2016   Hyperlipidemia 04/02/2012   Lower abdominal pain 12/30/2010   Preventative health care 09/10/2010   LUNG NODULE 05/10/2010   Low back pain 01/20/2009   Generalized osteoarthritis of multiple sites (left hip,knee,and lower back) 06/02/2008   ANEMIA-NOS 08/15/2006   Essential hypertension 08/11/2006   Allergic rhinitis 08/11/2006    Immunization History  Administered Date(s) Administered   Influenza Split 03/29/2012   Influenza,inj,Quad PF,6+ Mos 04/09/2013, 09/23/2014, 05/04/2015, 04/04/2016, 03/20/2017, 05/02/2018, 04/27/2020   Moderna Sars-Covid-2 Vaccination 05/07/2020, 06/07/2020   Pneumococcal Polysaccharide-23 03/29/2012, 04/04/2016   Tdap 10/14/2010    Patient reports she feels like the gabapentin is causing lightheadedness and blackouts. Patient reports a couple of hours later this happens and it always happens the days that she takes them. Patient is passing out with the black outs. Patient is taking it as needed and it happens only when she goes out. Patient reports the last one she had was 2-3 months. Patient reports she mostly takes it during the day.  Patient is drinking lots of water now. Patient reports her BP has been good and it has been a couple of months since any low readings.  Conditions to be addressed/monitored:  Hypertension, Hyperlipidemia, Asthma, Depression, Allergic Rhinitis and Pre-diabetes, Chronic pain  Conditions addressed this visit: Chronic pain, hyperlipidemia, hypertension   Patient Care Plan: CCM Pharmacy Care Plan     Problem Identified: Problem: Hypertension, Hyperlipidemia, Asthma, Depression, Allergic Rhinitis and Pre-diabetes, Chronic pain      Long-Range Goal: Patient-Specific Goal   Start Date: 12/02/2020  Expected End Date: 12/02/2021  Recent Progress: On track  Priority: High  Note:   Current Barriers:  Unable to independently monitor therapeutic efficacy  Pharmacist Clinical Goal(s):  Patient will achieve adherence to monitoring guidelines and medication adherence to achieve therapeutic efficacy through collaboration with PharmD and provider.   Interventions: 1:1 collaboration with [name], [name], MD regarding development and update of comprehensive plan of care as evidenced by provider attestation and co-signature Inter-disciplinary care team collaboration (see longitudinal plan of care) Comprehensive medication review performed; medication list updated in electronic medical record  Hypertension (BP goal <140/90) -Controlled -Current treatment: Lisinopril 40 mg daily  - Appropriate, Effective, Safe, Accessible -Medications previously tried: HCTZ -Current home readings:  130-135/70  (arm cuff) -Current dietary habits: avoids salt and doesn't use it to season or cook with at all -Current exercise habits: nothing structured -Denies hypotensive/hypertensive symptoms -Educated on Exercise goal of 150 minutes per week; Importance of home blood pressure monitoring; Proper BP monitoring technique; -Counseled to monitor BP at home weekly, document, and provide log at future appointments -Counseled on diet and exercise extensively Recommended to continue current medication  Hyperlipidemia: (LDL goal < 100) -Uncontrolled -Current treatment: Rosuvastatin 10 mg daily - Appropriate, Query effective, Safe, Accessible -Medications previously tried: none  -Current dietary patterns: did not discuss -Current exercise habits: was doing some chair exercises but not often -Educated on Cholesterol goals;  Benefits of statin for ASCVD risk reduction; Importance of limiting foods high in cholesterol; Exercise goal of 150 minutes per week; -Counseled on diet and exercise extensively Recommended to continue current medication Recommended repeat lipid panel.  Pre-diabetes/weight loss (A1c goal <6.5%) -Controlled -Current medications: No medications -Medications previously tried: Ozempic -Current home glucose readings fasting glucose: does not need to check post prandial glucose: does not need to check -Denies hypoglycemic/hyperglycemic symptoms -Current meal patterns:  breakfast: did not discuss  lunch: did not discuss   dinner: did not discuss  snacks: did not discuss  drinks: did not discuss  -Current exercise: was doing chair exercises but does not do often -Educated on A1c and blood sugar goals; Exercise goal of 150 minutes per week; Carbohydrate counting and/or plate method -Counseled to check feet daily and get yearly eye exams -Counseled on diet and exercise extensively Recommended to continue current medication  Asthma (Goal: control symptoms) -Not ideally  controlled -Current treatment  Albuterol as needed - Appropriate, Effective, Safe, Accessible Symbicort 160-4.5 mcg/act 2 puffs twice daily - Appropriate, Effective, Safe, Accessible -Medications previously tried: none  -Pulmonary function testing: n/a -Patient denies consistent use of maintenance inhaler -Frequency of rescue inhaler use: not often -Counseled on Benefits of consistent maintenance inhaler use When to use rescue inhaler Differences between maintenance and rescue inhalers -Recommended to use Symbicort daily as prescribed. Patient was worried about weight gain given steroid component but discussed low risk due to travel to the lungs  GERD (Goal: minimize symptoms) -Controlled -Current treatment  Pantoprazole 40 mg daily as needed - Appropriate, Effective, Safe, Accessible -Medications previously tried: none  -Recommended to continue current medication   Chronic pain (Goal: minimize pain) -Uncontrolled -Current treatment  Gabapentin 100 mg 1 capsule as needed - Appropriate, Query effective, Query Safe, Accessible -Medications previously tried: methocarbamol, tizanidine, tramadol  -Recommended discussing with PCP about replacement for gabapentin.  Health Maintenance -Vaccine gaps: shingrix, tetanus, COVID booster, influenza, Prevnar20 -Current therapy:  No medications -Educated on Cost vs benefit of each product must be carefully weighed by individual consumer -Patient is satisfied with current therapy and denies issues -Recommended to continue as is  Patient Goals/Self-Care Activities Patient will:  - take medications as prescribed check blood pressure weekly, document, and provide at future appointments target a minimum of 150 minutes of moderate intensity exercise weekly  Follow Up Plan: Telephone follow up appointment with care management team member scheduled for: 6 months      Medication Assistance: None required.  Patient affirms current coverage  meets needs.  Compliance/Adherence/Medication fill history: Care Gaps: Shingrix, COVID booster, tetanus, influenza, Prevnar20 A1c: 5.4% (08/09/21) BP- 136/80 (01/19/22)  Star-Rating Drugs: Lisinopril (Zestril) 40 mg - Last filled 05/20/22 100 DS at [facility] Rosuvastatin (Crestor) 10 mg - Last filled [facility]/23/22 [facility]  Patient's preferred pharmacy is:  [id number] [place] [place]Phone: [phone]: [phone] [place] [address]Phone: [phone]: [phone]  Uses pill box? Yes Pt endorses 100% compliance  We discussed: Current pharmacy is preferred with insurance plan and patient is satisfied with pharmacy services Patient decided to: Continue current medication management strategy  Care Plan and Follow Up Patient Decision:  Patient agrees to Care Plan and Follow-up.  Plan: Telephone follow up appointment with care management team member scheduled for:  6 months  MJeni Salles PharmD BWampsvillePharmacist [place]over[place]at [place] [phone]

## 2022-05-30 NOTE — Patient Instructions (Signed)
Hi Audreyanna,  It was great to catch up again! Keep on checking your blood pressure at least once a week and if you feel like you are having a low reading.  Don't forget to get your shingles and tetanus shots at the pharmacy.  Please reach out to me if you have any questions or need anything before our follow up!  Best, Maddie  Gaylord Shih, PharmD, [facility] Clinical Pharmacist [facility] at [place] [phone]  Visit Information   Goals Addressed   None    Patient Care Plan: CCM Pharmacy Care Plan     Problem Identified: Problem: Hypertension, Hyperlipidemia, Asthma, Depression, Allergic Rhinitis and Pre-diabetes, Chronic pain      Long-Range Goal: Patient-Specific Goal   Start Date: 12/02/2020  Expected End Date: 12/02/2021  Recent Progress: On track  Priority: High  Note:   Current Barriers:  Unable to independently monitor therapeutic efficacy  Pharmacist Clinical Goal(s):  Patient will achieve adherence to monitoring guidelines and medication adherence to achieve therapeutic efficacy through collaboration with PharmD and provider.   Interventions: 1:1 collaboration with [name], [name], MD regarding development and update of comprehensive plan of care as evidenced by provider attestation and co-signature Inter-disciplinary care team collaboration (see longitudinal plan of care) Comprehensive medication review performed; medication list updated in electronic medical record  Hypertension (BP goal <140/90) -Controlled -Current treatment: Lisinopril 40 mg daily  - Appropriate, Effective, Safe, Accessible -Medications previously tried: HCTZ -Current home readings: 130-135/70 (arm cuff) -Current dietary habits: avoids salt and doesn't use it to season or cook with at all -Current exercise habits: nothing structured -Denies hypotensive/hypertensive symptoms -Educated on Exercise goal of 150 minutes per week; Importance of home blood pressure  monitoring; Proper BP monitoring technique; -Counseled to monitor BP at home weekly, document, and provide log at future appointments -Counseled on diet and exercise extensively Recommended to continue current medication  Hyperlipidemia: (LDL goal < 100) -Uncontrolled -Current treatment: Rosuvastatin 10 mg daily - Appropriate, Query effective, Safe, Accessible -Medications previously tried: none  -Current dietary patterns: did not discuss -Current exercise habits: was doing some chair exercises but not often -Educated on Cholesterol goals;  Benefits of statin for ASCVD risk reduction; Importance of limiting foods high in cholesterol; Exercise goal of 150 minutes per week; -Counseled on diet and exercise extensively Recommended to continue current medication Recommended repeat lipid panel.  Pre-diabetes/weight loss (A1c goal <6.5%) -Controlled -Current medications: No medications -Medications previously tried: Ozempic -Current home glucose readings fasting glucose: does not need to check post prandial glucose: does not need to check -Denies hypoglycemic/hyperglycemic symptoms -Current meal patterns:  breakfast: did not discuss  lunch: did not discuss   dinner: did not discuss  snacks: did not discuss  drinks: did not discuss  -Current exercise: was doing chair exercises but does not do often -Educated on A1c and blood sugar goals; Exercise goal of 150 minutes per week; Carbohydrate counting and/or plate method -Counseled to check feet daily and get yearly eye exams -Counseled on diet and exercise extensively Recommended to continue current medication  Asthma (Goal: control symptoms) -Not ideally controlled -Current treatment  Albuterol as needed - Appropriate, Effective, Safe, Accessible Symbicort 160-4.5 mcg/act 2 puffs twice daily - Appropriate, Effective, Safe, Accessible -Medications previously tried: none  -Pulmonary function testing: n/a -Patient denies  consistent use of maintenance inhaler -Frequency of rescue inhaler use: not often -Counseled on Benefits of consistent maintenance inhaler use When to use rescue inhaler Differences between maintenance and rescue inhalers -Recommended to use  Symbicort daily as prescribed. Patient was worried about weight gain given steroid component but discussed low risk due to travel to the lungs  GERD (Goal: minimize symptoms) -Controlled -Current treatment  Pantoprazole 40 mg daily as needed - Appropriate, Effective, Safe, Accessible -Medications previously tried: none  -Recommended to continue current medication   Chronic pain (Goal: minimize pain) -Uncontrolled -Current treatment  Gabapentin 100 mg 1 capsule as needed - Appropriate, Query effective, Query Safe, Accessible -Medications previously tried: methocarbamol, tizanidine, tramadol  -Recommended discussing with PCP about replacement for gabapentin.  Health Maintenance -Vaccine gaps: shingrix, tetanus, COVID booster, influenza, Prevnar20 -Current therapy:  No medications -Educated on Cost vs benefit of each product must be carefully weighed by individual consumer -Patient is satisfied with current therapy and denies issues -Recommended to continue as is  Patient Goals/Self-Care Activities Patient will:  - take medications as prescribed check blood pressure weekly, document, and provide at future appointments target a minimum of 150 minutes of moderate intensity exercise weekly  Follow Up Plan: Telephone follow up appointment with care management team member scheduled for: 6 months       Patient verbalizes understanding of instructions and care plan provided today and agrees to view in MyChart. Active MyChart status and patient understanding of how to access instructions and care plan via MyChart confirmed with patient.    Telephone follow up appointment with pharmacy team member scheduled for: 6 months  Verner Chol, [facility]

## 2022-06-06 ENCOUNTER — Encounter: Payer: Self-pay | Admitting: Family Medicine

## 2022-06-06 ENCOUNTER — Ambulatory Visit (INDEPENDENT_AMBULATORY_CARE_PROVIDER_SITE_OTHER): Payer: Medicare Other | Admitting: Family Medicine

## 2022-06-06 VITALS — BP 130/80 | HR 84 | Resp 16 | Ht 65.0 in | Wt 292.0 lb

## 2022-06-06 DIAGNOSIS — M5441 Lumbago with sciatica, right side: Secondary | ICD-10-CM

## 2022-06-06 DIAGNOSIS — R42 Dizziness and giddiness: Secondary | ICD-10-CM | POA: Diagnosis not present

## 2022-06-06 DIAGNOSIS — Z23 Encounter for immunization: Secondary | ICD-10-CM

## 2022-06-06 DIAGNOSIS — G8929 Other chronic pain: Secondary | ICD-10-CM | POA: Diagnosis not present

## 2022-06-06 DIAGNOSIS — I10 Essential (primary) hypertension: Secondary | ICD-10-CM

## 2022-06-06 MED ORDER — AMITRIPTYLINE HCL 25 MG PO TABS: 25.0000 mg | ORAL_TABLET | Freq: Every day | ORAL | 1 refills | Status: DC

## 2022-06-06 NOTE — Progress Notes (Unsigned)
ACUTE VISIT Chief Complaint  Patient presents with   Medication Management    Gabapentin replacement   HPI: [name] is a 66 y.o. female, who is here today complaining of *** She was last seen on ***, evaluated in the ED on 03/05/22 for dehydration. of persistent back pain radiating down the left leg, which is not relieved by Gabapentin. The pain is described as constant, with a severity of 8-9 out of 10, and is worse at night, interfering with sleep. The patient reports a history of lower back pain, with an MRI from 2020 showing progressive facet degeneration at L2-L3 and mild spinal stenosis. The patient has been receiving steroid injections in the hip for pain management.  She has experienced dizziness and blackouts, which she attributes to Gabapentin use. She has tried taking the medication as needed and daily, but the side effects persist. The patient has a history of dehydration, which led to an ER visit.  She has previously tried Cymbalta (Duloxetine) for pain management but did not find it helpful. She has not experienced numbness or tingling in the leg, but describes a nagging ache and a funny feeling down the leg HPI She has previously tried Cymbalta (Duloxetine) for pain management but did not find it helpful. She has not experienced numbness or tingling in the leg, but describes a nagging ache and a funny feeling down the leg.  Her sleep is disrupted due to pain, averaging only five hours per night.  Review of Systems See other pertinent positives and negatives in HPI.  Current Outpatient Medications on File Prior to Visit  Medication Sig Dispense Refill   albuterol (VENTOLIN HFA) 108 (90 Base) MCG/ACT inhaler Inhale 1-2 puffs into the lungs every 6 (six) hours as needed for wheezing or shortness of breath. 3 each 0   alum & mag hydroxide-simeth (MAALOX MAX) 400-400-40 MG/5ML suspension Take 15 mLs by mouth every 6 (six) hours as needed for indigestion. 355 mL 1    budesonide-formoterol (SYMBICORT) 160-4.5 MCG/ACT inhaler Inhale 2 puffs into the lungs 2 (two) times daily. (Patient taking differently: Inhale 2 puffs into the lungs daily as needed (Wheezing and Asthma).) 3 each 4   calcium carbonate (OS-CAL - DOSED IN MG OF ELEMENTAL CALCIUM) 1250 (500 Ca) MG tablet Take 1 tablet by mouth 2 (two) times daily with a meal.     Cholecalciferol (VITAMIN D) 50 MCG (2000 UT) CAPS Take 2,000 Units by mouth daily.     diclofenac Sodium (VOLTAREN) 1 % GEL Apply 2 g topically 4 (four) times daily.     lisinopril (ZESTRIL) 40 MG tablet TAKE 1 TABLET BY MOUTH  DAILY 90 tablet 3   Multiple Vitamins-Minerals (BARIATRIC MULTIVITAMINS/IRON PO) Take 1 tablet by mouth daily.     ondansetron (ZOFRAN-ODT) 4 MG disintegrating tablet Take 1 tablet (4 mg total) by mouth every 6 (six) hours as needed for nausea or vomiting. 20 tablet 0   pantoprazole (PROTONIX) 40 MG tablet Take 1 tablet (40 mg total) by mouth daily. 90 tablet 0   rosuvastatin (CRESTOR) 10 MG tablet Take 1 tablet (10 mg total) by mouth daily. 90 tablet 3   trolamine salicylate (ASPERCREME) 10 % cream Apply 1 application topically as needed for muscle pain.     No current facility-administered medications on file prior to visit.    Past Medical History:  Diagnosis Date   Abnormal EKG 06/2003   History of inverted T waves V1-V3. Normal 2D echo (07/23/2003): LVEF 65%.  Anemia    BL Hgb 11-12. Ferritin 14 - low normal (08/2007). Colonoscopy 2009 - external hemorrhoids (excellent prep). Last anemia panel (12/2010) - Iron  24, TIBC 269,  B12  316, Folate 11.9, Ferritin 73.   Asthma    B12 deficiency    Back pain    Chest pain    Degenerative joint disease    BL knees (L>R), lumbar spine. Followed by Sports Medicine, Dr. Jennette Kettle.   Dyspnea    Gastric bypass status for obesity    History of multiple pulmonary nodules    Incidental finding: CT Abd/ Pelvis (04/2010) - Several small lower lobe lung nodules, including  one pure ground-glass pulmonary nodule measuring 8 mm in the left lower lobe.  Recommend follow-up chest CT (IV contrast preferred) in 6 months to document stability. //  CT Abd/ Pelvis (07/2010) -  3 mm RLL and  8 mm LLL nodule stable.  Other nodules unchanged, likely benign.   Hyperlipidemia    Hypertension    Incarcerated ventral hernia 04/2010   Noted on CT Abd/ pelvis (04/2010). Patient now s/p ventral hernia repair by Dr. 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 2D echo (2005) - EF 65%.   Obesity    OSA (obstructive sleep apnea)    Palpitations    Positive D dimer    Pre-diabetes    Right knee pain    Allergies  Allergen Reactions   Ketorolac Tromethamine Shortness Of Breath and Palpitations   Atrovent [Ipratropium] Hives and Rash   Latex Rash and Other (See Comments)    NO POWDERED GLOVES!!!!   Tape Dermatitis    Irritates skin     Social History   Socioeconomic History   Marital status: Married    Spouse name: Tax adviser   Number of children: 3   Years of education: Not on file   Highest education level: Some college, no degree  Occupational History   Occupation: Stay at home  Tobacco Use   Smoking status: Former    Packs/day: 0.50    Years: 20.00    Total pack years: 10.00    Types: Cigarettes    Quit date: 09/10/1998    Years since quitting: 23.7   Smokeless tobacco: Never   Tobacco comments:    66 yo   Vaping Use   Vaping Use: Never used  Substance and Sexual Activity   Alcohol use: No    Alcohol/week: 0.0 standard drinks of alcohol   Drug use: No   Sexual activity: Yes    Partners: Male  Other Topics Concern   Not on file  Social History Narrative   10/05/2018: Lives with husband, daughter, and 2 grandchildren in [place] house. Lives on main level though mostly.   Has three children  altogether, all local, supportive   Currently getting ramp installed in house d/t pt difficulty getting upstairs.      Social Determinants of Health   Financial Resource Strain: Low Risk  (05/30/2022)   Overall Financial Resource Strain (CARDIA)    Difficulty of Paying Living Expenses: Not very hard  Food Insecurity: No Food Insecurity (03/02/2022)   Hunger Vital Sign    Worried About Running Out of Food in the Last Year: Never true    Ran Out of  Food in the Last Year: Never true  Transportation Needs: No Transportation Needs (03/02/2022)   PRAPARE - Administrator, Civil Service (Medical): No    Lack of Transportation (Non-Medical): No  Physical Activity: Unknown (06/12/2021)   Exercise Vital Sign    Days of Exercise per Week: Patient refused    Minutes of Exercise per Session: Not on file  Stress: No Stress Concern Present (03/02/2022)   [employer] of Occupational Health - Occupational Stress Questionnaire    Feeling of Stress : Not at all  Social Connections: Socially Integrated (03/02/2022)   Social Connection and Isolation Panel [NHANES]    Frequency of Communication with Friends and Family: More than three times a week    Frequency of Social Gatherings with Friends and Family: More than three times a week    Attends Religious Services: More than 4 times per year    Active Member of [employer] or Organizations: Yes    Attends Banker Meetings: More than 4 times per year    Marital Status: Married   Vitals:   06/06/22 1157  BP: 130/80  Pulse: 84  Resp: 16  SpO2: 99%   Body mass index is 48.59 kg/m.  Physical Exam Vitals and nursing note reviewed.  Constitutional:      General: She is not in acute distress.    Appearance: She is well-developed.  HENT:     Head: Normocephalic and atraumatic.  Eyes:     Conjunctiva/sclera: Conjunctivae normal.  Cardiovascular:     Rate and Rhythm: Normal rate and regular rhythm.     Pulses:           Dorsalis pedis pulses are 2+ on the right side and 2+ on the left side.       Posterior tibial pulses are 2+ on the left side.     Heart sounds: No murmur heard. Pulmonary:     Effort: Pulmonary effort is normal. No respiratory distress.     Breath sounds: Normal breath sounds.  Abdominal:     Palpations: Abdomen is soft. There is no mass.     Tenderness: There is no abdominal tenderness.  Musculoskeletal:     Lumbar back: Tenderness present.       Back:     Right lower leg: No edema.     Left lower leg: No edema.  Lymphadenopathy:     Cervical: No cervical adenopathy.  Skin:    General: Skin is warm.     Findings: No erythema or rash.  Neurological:     General: No focal deficit present.     Mental Status: She is alert and oriented to person, place, and time.     Cranial Nerves: No cranial nerve deficit.  Psychiatric:     Comments: Well groomed, good eye contact.     ASSESSMENT AND PLAN: Essential hypertension  Chronic right-sided low back pain with right-sided sciatica -     Amitriptyline HCl; Take 1 tablet (25 mg total) by mouth at bedtime.  Dispense: 30 tablet; Refill: 1  Dizziness    Return in about 2 months (around 08/06/2022) for chronic problems.  Iza Preston G. [name], MD  [facility] [place] office.  Discharge Instructions   None

## 2022-06-06 NOTE — Patient Instructions (Addendum)
A few things to remember from today's visit:  Essential hypertension  Chronic right-sided low back pain with right-sided sciatica - Plan: amitriptyline (ELAVIL) 25 MG tablet  Dizziness  Stop Gabapentin. Amitriptyline started today, 1/2 tab at bedtime and increase to 1 tab in 10 days.  Radicular Pain Radicular pain is a type of pain that spreads from your back or neck along a spinal nerve. Spinal nerves are nerves that leave the spinal cord and go to the muscles. Radicular pain is sometimes called radiculopathy, radiculitis, or a pinched nerve. When you have this type of pain, you may also have weakness, numbness, or tingling in the area of your body that is supplied by the nerve. The pain may feel sharp and burning. Depending on which spinal nerve is affected, the pain may occur in the: Neck area (cervical radicular pain). You may also feel pain, numbness, weakness, or tingling in the arms. Mid-spine area (thoracic radicular pain). You would feel this pain in the back and chest. This type is rare. Lower back area (lumbar radicular pain). You would feel this pain as low back pain. You may feel pain, numbness, weakness, or tingling in the buttocks or legs. Sciatica is a type of lumbar radicular pain that shoots down the back of the leg. Radicular pain occurs when one of the spinal nerves becomes irritated or squeezed (compressed). It is often caused by something pushing on a spinal nerve, such as one of the bones of the spine (vertebrae) or one of the round cushions between vertebrae (intervertebral disks). This can result from: An injury. Wear and tear or aging of a disk. The growth of a bone spur that pushes on the nerve. Radicular pain often goes away when you follow instructions from your health care provider for relieving pain at home. How is this treated? Treatment may depend on the cause of the condition and may include: Working with a physical therapist. Taking pain medicine. Applying  heat or ice or both to the affected areas. Doing stretches to improve flexibility. Having surgery. This may be needed if other treatments do not help. Different types of surgery may be done depending on the cause of this condition. Follow these instructions at home: Managing pain     If directed, put ice on the affected area. To do this: Put ice in a plastic bag. Place a towel between your skin and the bag. Leave the ice on for 20 minutes, 2-3 times a day. Remove the ice if your skin turns bright red. This is very important. If you cannot feel pain, heat, or cold, you have a greater risk of damage to the area. If directed, apply heat to the affected area as often as told by your health care provider. Use the heat source that your health care provider recommends, such as a moist heat pack or a heating pad. Place a towel between your skin and the heat source. Leave the heat on for 20-30 minutes. Remove the heat if your skin turns bright red. This is especially important if you are unable to feel pain, heat, or cold. You have a greater risk of getting burned. Activity Do not sit or rest in bed for long periods of time. Try to stay as active as possible. Ask your health care provider what type of exercise or activity is best for you. Avoid activities that make your pain worse, such as bending and lifting. You may have to avoid lifting. Ask your health care provider how much you  can safely lift. Practice using proper technique when lifting items. Proper lifting technique involves bending your knees and rising up. Do strength and range-of-motion exercises only as told by your health care provider or physical therapist. General instructions Take over-the-counter and prescription medicines only as told by your health care provider. Pay attention to any changes in your symptoms. Keep all follow-up visits. This is important. Contact a health care provider if: Your pain and other symptoms get  worse. Your pain medicine is not helping. Your pain has not improved after a few weeks of home care. You have a fever. Get help right away if: You have severe pain, weakness, or numbness. You have difficulty with bladder or bowel control. Summary Radicular pain is a type of pain that spreads from your back or neck along a spinal nerve. When you have radicular pain, you may also have weakness, numbness, or tingling in the area of your body that is supplied by the nerve. The pain may feel sharp or burning. Radicular pain may be treated with ice, heat, medicines, or physical therapy. This information is not intended to replace advice given to you by your health care provider. Make sure you discuss any questions you have with your health care provider. Document Revised: 12/31/2020 Document Reviewed: 12/31/2020 Elsevier Patient Education  2023 [employer].  If you need refills for medications you take chronically, please call your pharmacy. Do not use My Chart to request refills or for acute issues that need immediate attention. If you send a my chart message, it may take a few days to be addressed, specially if I am not in the office.  Please be sure medication list is accurate. If a new problem present, please set up appointment sooner than planned today.

## 2022-06-09 DIAGNOSIS — E785 Hyperlipidemia, unspecified: Secondary | ICD-10-CM

## 2022-06-09 DIAGNOSIS — I10 Essential (primary) hypertension: Secondary | ICD-10-CM

## 2022-06-09 DIAGNOSIS — J45909 Unspecified asthma, uncomplicated: Secondary | ICD-10-CM

## 2022-06-09 NOTE — Assessment & Plan Note (Signed)
BP today adequately controlled, reporting similar readings at home. Continue lisinopril 40 mg daily. Low salt diet to continue. Continue monitoring BP regularly.

## 2022-06-09 NOTE — Assessment & Plan Note (Signed)
With radicular symptoms LLE. Gabapentin seems to be causing dizziness and she does not feel like helping;although she has not been taking medication daily. Stop Gabapentin. Duloxetine did not help. Amitriptyline side effects discussed, she would like to try. Start Amitriptyline 25 mg 1/2 tab daily at bedtime and increase to 1 tab in 10 days. Instructed to also follow with orthopedist.

## 2022-06-24 ENCOUNTER — Encounter: Payer: Self-pay | Admitting: Family Medicine

## 2022-08-08 ENCOUNTER — Ambulatory Visit: Payer: Medicare Other | Admitting: Family Medicine

## 2022-08-08 ENCOUNTER — Other Ambulatory Visit: Payer: Self-pay | Admitting: Family Medicine

## 2022-08-08 DIAGNOSIS — G8929 Other chronic pain: Secondary | ICD-10-CM

## 2022-08-08 NOTE — Progress Notes (Unsigned)
HPI: [name] is a 67 y.o. female, who is here today with her husband  to follow on recent visit. She was last seen on 06/06/22. Since her last visit she has followed with nutritionists.  Chronic back pain radiating down the LLE. Lst visit Amitriptyline 25 mg was started. She reports improvement since starting amitriptyline. However, she experiences drowsiness the day after taking the medication, which has led her to take it only twice a week.   HTN: She is on Lisinopril 40 mg daily. Negative for severe/frequent headache, visual changes, chest pain, dyspnea, palpitation, focal weakness, or edema. OA, specially knees and hips. She is hoping to finally have her left hip replaced but needs a BMI < 40. She has not seen her ortho in months.  Obesity s/p bariatric procedure, gastric sleeve on 08/16/21. She is still following a healthful diet, she cannot exercise regularly because of pain. She has been successful in losing weight, with her BMI decreasing from 48 to 42.7.  She uses a roller at home for transfer.  Lab Results  Component Value Date   CREATININE 0.76 01/18/2022   BUN 12 01/18/2022   NA 140 01/18/2022   K 4.2 01/18/2022   CL 107 01/18/2022   CO2 25 01/18/2022   Has had depressed mood in the past, improved.    08/09/2022   10:59 AM 06/06/2022   12:31 PM 03/02/2022    1:13 PM 03/02/2022    1:12 PM 01/18/2022    3:37 PM  Depression screen PHQ 2/9  Decreased Interest 0 1 0 0 2  Down, Depressed, Hopeless 0 0 0 0 0  PHQ - 2 Score 0 1 0 0 2  Altered sleeping 0 1 0 0 1  Tired, decreased energy 1 0 0 0 0  Change in appetite 0 0 0 0 0  Feeling bad or failure about yourself  0 1 0 0 0  Trouble concentrating 0 0 0 0 1  Moving slowly or fidgety/restless 0 0 0 0 0  Suicidal thoughts 0 0 0 0 0  PHQ-9 Score 1 3 0 0 4  Difficult doing work/chores Somewhat difficult Very difficult Not difficult at all Not difficult at all Somewhat difficult   Review of Systems   Constitutional:  Negative for chills and fever.  Respiratory:  Negative for cough and wheezing.   Gastrointestinal:  Negative for abdominal pain, nausea and vomiting.  Genitourinary:  Negative for decreased urine volume, dysuria and hematuria.  Skin:  Negative for rash.  Neurological:  Negative for syncope and facial asymmetry.  See other pertinent positives and negatives in HPI.  Current Outpatient Medications on File Prior to Visit  Medication Sig Dispense Refill   albuterol (VENTOLIN HFA) 108 (90 Base) MCG/ACT inhaler Inhale 1-2 puffs into the lungs every 6 (six) hours as needed for wheezing or shortness of breath. 3 each 0   alum & mag hydroxide-simeth (MAALOX MAX) 400-400-40 MG/5ML suspension Take 15 mLs by mouth every 6 (six) hours as needed for indigestion. 355 mL 1   budesonide-formoterol (SYMBICORT) 160-4.5 MCG/ACT inhaler Inhale 2 puffs into the lungs 2 (two) times daily. (Patient taking differently: Inhale 2 puffs into the lungs daily as needed (Wheezing and Asthma).) 3 each 4   calcium carbonate (OS-CAL - DOSED IN MG OF ELEMENTAL CALCIUM) 1250 (500 Ca) MG tablet Take 1 tablet by mouth 2 (two) times daily with a meal.     Cholecalciferol (VITAMIN D) 50 MCG (2000 UT) CAPS Take 2,000 Units by  mouth daily.     diclofenac Sodium (VOLTAREN) 1 % GEL Apply 2 g topically 4 (four) times daily.     lisinopril (ZESTRIL) 40 MG tablet TAKE 1 TABLET BY MOUTH  DAILY 90 tablet 3   Multiple Vitamins-Minerals (BARIATRIC MULTIVITAMINS/IRON PO) Take 1 tablet by mouth daily.     ondansetron (ZOFRAN-ODT) 4 MG disintegrating tablet Take 1 tablet (4 mg total) by mouth every 6 (six) hours as needed for nausea or vomiting. 20 tablet 0   pantoprazole (PROTONIX) 40 MG tablet Take 1 tablet (40 mg total) by mouth daily. 90 tablet 0   rosuvastatin (CRESTOR) 10 MG tablet Take 1 tablet (10 mg total) by mouth daily. 90 tablet 3   trolamine salicylate (ASPERCREME) 10 % cream Apply 1 application topically as needed  for muscle pain.     No current facility-administered medications on file prior to visit.   Past Medical History:  Diagnosis Date   Abnormal EKG 06/2003   History of inverted T waves V1-V3. Normal 2D echo (07/23/2003): LVEF 65%.   Anemia    BL Hgb 11-12. Ferritin 14 - low normal (08/2007). Colonoscopy 2009 - external hemorrhoids (excellent prep). Last anemia panel (12/2010) - Iron  24, TIBC 269,  B12  316, Folate 11.9, Ferritin 73.   Asthma    B12 deficiency    Back pain    Chest pain    Degenerative joint disease    BL knees (L>R), lumbar spine. Followed by Sports Medicine, Dr. Nori Riis.   Dyspnea    Gastric bypass status for obesity    History of multiple pulmonary nodules    Incidental finding: CT Abd/ Pelvis (04/2010) - Several small lower lobe lung nodules, including one pure ground-glass pulmonary nodule measuring 8 mm in the left lower lobe.  Recommend follow-up chest CT (IV contrast preferred) in 6 months to document stability. //  CT Abd/ Pelvis (07/2010) -  3 mm RLL and  8 mm LLL nodule stable.  Other nodules unchanged, likely benign.   Hyperlipidemia    Hypertension    Incarcerated ventral hernia 04/2010   Noted on CT Abd/ pelvis (04/2010). Patient now s/p ventral hernia repair by Dr. Harlow Asa (12/2010)   Insomnia    Knee osteoarthritis    s/p Left total knee replacement (06/2011)   Left hip pain    Left ventricular diastolic dysfunction    Leg edema    Lower extremity edema    Chronic. 2D echo (2005) - EF 65%.   Obesity    OSA (obstructive sleep apnea)    Palpitations    Positive D dimer    Pre-diabetes    Right knee pain    Allergies  Allergen Reactions   Ketorolac Tromethamine Shortness Of Breath and Palpitations   Atrovent [Ipratropium] Hives and Rash   Latex Rash and Other (See Comments)    NO POWDERED GLOVES!!!!   Tape Dermatitis    Irritates skin    Social History   Socioeconomic History   Marital status: Married    Spouse name: Jeron   Number of  children: 3   Years of education: Not on file   Highest education level: 12th grade  Occupational History   Occupation: Stay at home  Tobacco Use   Smoking status: Former    Packs/day: 0.50    Years: 20.00    Total pack years: 10.00    Types: Cigarettes    Quit date: 09/10/1998    Years since quitting: 23.9   Smokeless tobacco:  Never   Tobacco comments:    67 yo   Vaping Use   Vaping Use: Never used  Substance and Sexual Activity   Alcohol use: No    Alcohol/week: 0.0 standard drinks of alcohol   Drug use: No   Sexual activity: Yes    Partners: Male  Other Topics Concern   Not on file  Social History Narrative   10/05/2018: Lives with husband, daughter, and 2 grandchildren in [place] house. Lives on main level though mostly.   Has three children altogether, all local, supportive   Currently getting ramp installed in house d/t pt difficulty getting upstairs.      Social Determinants of Health   Financial Resource Strain: Low Risk  (08/09/2022)   Overall Financial Resource Strain (CARDIA)    Difficulty of Paying Living Expenses: Not hard at all  Food Insecurity: No Food Insecurity (08/09/2022)   Hunger Vital Sign    Worried About Running Out of Food in the Last Year: Never true    Ran Out of Food in the Last Year: Never true  Transportation Needs: No Transportation Needs (08/09/2022)   PRAPARE - Hydrologist (Medical): No    Lack of Transportation (Non-Medical): No  Physical Activity: Unknown (08/09/2022)   Exercise Vital Sign    Days of Exercise per Week: 2 days    Minutes of Exercise per Session: Not on file  Stress: No Stress Concern Present (08/09/2022)   [place]    Feeling of Stress : Not at all  Social Connections: [place] (08/09/2022)   Social Connection and Isolation Panel [NHANES]    Frequency of Communication with Friends and Family: Once a week     Frequency of Social Gatherings with Friends and Family: Three times a week    Attends Religious Services: More than 4 times per year    Active Member of Clubs or Organizations: Yes    Attends Archivist Meetings: More than 4 times per year    Marital Status: Married   Today's Vitals   08/09/22 1051  BP: 122/70  Pulse: 67  Resp: 16  Weight: 256 lb 9.6 oz (116.4 kg)  Height: 5\' 5"  (1.651 m)   Wt Readings from Last 3 Encounters:  08/09/22 256 lb 9.6 oz (116.4 kg)  08/09/22 256 lb 9.6 oz (116.4 kg)  06/06/22 292 lb (132.5 kg)   Body mass index is 42.7 kg/m.  Physical Exam Vitals and nursing note reviewed.  Constitutional:      General: She is not in acute distress.    Appearance: She is well-developed.  HENT:     Head: Normocephalic and atraumatic.  Eyes:     Conjunctiva/sclera: Conjunctivae normal.  Cardiovascular:     Rate and Rhythm: Normal rate and regular rhythm.     Heart sounds: No murmur heard.    Comments: Trace pitting LE edema, bilateral. Pulmonary:     Effort: Pulmonary effort is normal. No respiratory distress.     Breath sounds: Normal breath sounds.  Abdominal:     Palpations: Abdomen is soft.     Tenderness: There is no abdominal tenderness.  Musculoskeletal:     Right lower leg: No edema.     Left lower leg: No edema.  Lymphadenopathy:     Cervical: No cervical adenopathy.  Skin:    General: Skin is warm.     Findings: No erythema or rash.  Neurological:  General: No focal deficit present.     Mental Status: She is alert and oriented to person, place, and time.     Cranial Nerves: No cranial nerve deficit.     Comments: In a wheel chair.  Psychiatric:        Mood and Affect: Mood and affect normal.   ASSESSMENT AND PLAN:  [name] was seen today for medical management of chronic issues.  Diagnoses and all orders for this visit: Lab Results  Component Value Date   CREATININE 0.65 08/09/2022   BUN 12 08/09/2022   NA 139  08/09/2022   K 4.0 08/09/2022   CL 105 08/09/2022   CO2 27 08/09/2022   Essential hypertension Assessment & Plan: BP today adequately controlled,. Continue lisinopril 40 mg daily and low salt diet. Eye exam is current.  Orders: -     Basic metabolic panel; Future  Chronic right-sided low back pain with right-sided sciatica Assessment & Plan: Amitriptyline has helped, recommend taking it daily at bedtime but lower dose, 12.5 mg daily.  Some side effects discussed. Fall precautions to continue.  Orders: -     Amitriptyline HCl; TAKE 0.5 TABLET BY MOUTH EVERYDAY AT BEDTIME  Dispense: 45 tablet; Refill: 1  Depression, major, single episode, moderate (HCC) Assessment & Plan: Improved, denies symptoms. PHQ 0. Amitriptyline 12.5 mg daily will also help.   Generalized osteoarthritis of multiple sites (left hip,knee,and lower back) Assessment & Plan: Left hip is the most symptomatic at this time and limiting mobility. Recommend following with her ortho to discuss surgical treatments given the fact she is close to the BMI goal.    Return in about 5 months (around 01/08/2023) for chronic problems.  Sha Burling G. [name], MD  [facility]. [place] office.

## 2022-08-09 ENCOUNTER — Ambulatory Visit (INDEPENDENT_AMBULATORY_CARE_PROVIDER_SITE_OTHER): Payer: Medicare Other | Admitting: Family Medicine

## 2022-08-09 ENCOUNTER — Encounter: Payer: Self-pay | Admitting: Family Medicine

## 2022-08-09 ENCOUNTER — Encounter: Payer: Medicare Other | Attending: Surgery | Admitting: Skilled Nursing Facility1

## 2022-08-09 VITALS — BP 122/70 | HR 67 | Resp 16 | Ht 65.0 in | Wt 256.6 lb

## 2022-08-09 VITALS — Ht 65.0 in | Wt 256.6 lb

## 2022-08-09 DIAGNOSIS — E669 Obesity, unspecified: Secondary | ICD-10-CM | POA: Diagnosis present

## 2022-08-09 DIAGNOSIS — G4730 Sleep apnea, unspecified: Secondary | ICD-10-CM | POA: Insufficient documentation

## 2022-08-09 DIAGNOSIS — G8929 Other chronic pain: Secondary | ICD-10-CM

## 2022-08-09 DIAGNOSIS — R7303 Prediabetes: Secondary | ICD-10-CM | POA: Insufficient documentation

## 2022-08-09 DIAGNOSIS — F321 Major depressive disorder, single episode, moderate: Secondary | ICD-10-CM | POA: Diagnosis not present

## 2022-08-09 DIAGNOSIS — I10 Essential (primary) hypertension: Secondary | ICD-10-CM

## 2022-08-09 DIAGNOSIS — M5441 Lumbago with sciatica, right side: Secondary | ICD-10-CM | POA: Diagnosis not present

## 2022-08-09 DIAGNOSIS — Z713 Dietary counseling and surveillance: Secondary | ICD-10-CM | POA: Insufficient documentation

## 2022-08-09 DIAGNOSIS — M159 Polyosteoarthritis, unspecified: Secondary | ICD-10-CM

## 2022-08-09 LAB — BASIC METABOLIC PANEL
BUN: 12 mg/dL (ref 6–23)
CO2: 27 mEq/L (ref 19–32)
Calcium: 10 mg/dL (ref 8.4–10.5)
Chloride: 105 mEq/L (ref 96–112)
Creatinine, Ser: 0.65 mg/dL (ref 0.40–1.20)
GFR: 92.42 mL/min (ref >60.00)
Glucose, Bld: 90 mg/dL (ref 70–99)
Potassium: 4 mEq/L (ref 3.5–5.1)
Sodium: 139 mEq/L (ref 135–145)

## 2022-08-09 MED ORDER — AMITRIPTYLINE HCL 25 MG PO TABS: ORAL_TABLET | ORAL | 1 refills | Status: DC

## 2022-08-09 NOTE — Progress Notes (Signed)
Follow-up visit:  Post-Operative sleeve  Surgery  Primary concerns today: Post-operative Bariatric Surgery Nutrition Management   Sleeve gastrectomy  Surgery date: 08/16/2021 Surgery type: Sleeve Start weight at [facility]: 352.6 Weight today: 256.6 pounds  Clinical  Medical hx: Prediabetes, sleep apnea Medications: ozempic Labs:  Notable signs/symptoms: limited mobility due to hip pain Any previous deficiencies? Yes, vitamin B12 , vitamin D   Body Composition Scale 01/19/2022 04/20/2022  Current Body Weight 305.5 290.7  Total Body Fat % 50.8 50  Visceral Fat 25 24  Fat-Free Mass % 49.1 49.9   Total Body Water % 39 39.4  Muscle-Mass lbs 30.5 30.2  BMI 50.5 48  Body Fat Displacement           Torso  lbs 96.4 90.3         Left Leg  lbs 19.2 18         Right Leg  lbs 19.2 18         Left Arm  lbs 9.6 9.0         Right Arm   lbs 9.6 9.0    Pt arrives in a wheel chair stating she is still trying to qualify for a hip replacement but needs her BMI lowered.  Pt states she has been doing some work stretches 3 days a week.  Pts husband states she is pickey. Pts husband states he is trying to quit smoking but it has been tough. Pt is hoping to get her hip replaced now that her BMI is 42 and is excited to be more independent and plant flowers.   24 hr recall:  First meal: 2 scrambled eggs and 2 [place] sausage  Snack:  Second meal: shrimp + pasta and smoothie: kale, spinach, mint, ginger, lemon, fruit, + water Snack: peanut butter crackers Third meal: salad and chicken and cucumber or crab salad + ranch and smoothie   Beverages: water + flavored, water, soda, decaf coffee + artificial sugar  Fluid intake: 67 oz of water reported   Medications: See List Supplementation: multi and calcium   Using straws: no Drinking while eating: no Having you been chewing well: yes Chewing/swallowing difficulties: no Changes in vision: no Changes to mood/headaches: no Hair loss/Cahnges to  skin/Changes to nails: no Any difficulty focusing or concentrating: no Sweating: no Dizziness/Lightheaded: no Palpitations: no  Carbonated beverages: no N/V/D/C/GAS: no Abdominal Pain: no Dumping syndrome: no  Recent physical activity:  ADL's  Progress Towards Goal(s):  In Progress Teaching method utilized: Environmental health practitioner & Auditory  Demonstrated degree of understanding via: Teach Back  Readiness Level: Action Barriers to learning/adherence to lifestyle change: none identified   Teaching Method Utilized:  Visual Auditory Hands on  Demonstrated degree of understanding via:  Teach Back   Monitoring/Evaluation:  Dietary intake, exercise, and body weight. Follow up in 3 months

## 2022-08-09 NOTE — Patient Instructions (Addendum)
A few things to remember from today's visit:  Essential hypertension - Plan: Basic metabolic panel  Chronic right-sided low back pain with right-sided sciatica - Plan: amitriptyline (ELAVIL) 25 MG tablet  We sent order for walker, please call to inquire about status: [phone] [employer] is a wholly owned subsidiary of [employer], [place] in [place], [place].  Continue Amitriptyline but daily, you can try 1/21 tab instead 1 tab. No changes in rest.  If you need refills for medications you take chronically, please call your pharmacy. Do not use My Chart to request refills or for acute issues that need immediate attention. If you send a my chart message, it may take a few days to be addressed, specially if I am not in the office.  Please be sure medication list is accurate. If a new problem present, please set up appointment sooner than planned today.

## 2022-08-11 NOTE — Assessment & Plan Note (Signed)
Left hip is the most symptomatic at this time and limiting mobility. Recommend following with her ortho to discuss surgical treatments given the fact she is close to the BMI goal.

## 2022-08-11 NOTE — Assessment & Plan Note (Signed)
BP today adequately controlled,. Continue lisinopril 40 mg daily and low salt diet. Eye exam is current.

## 2022-08-11 NOTE — Assessment & Plan Note (Signed)
Improved, denies symptoms. PHQ 0. Amitriptyline 12.5 mg daily will also help.

## 2022-08-11 NOTE — Assessment & Plan Note (Signed)
Amitriptyline has helped, recommend taking it daily at bedtime but lower dose, 12.5 mg daily.  Some side effects discussed. Fall precautions to continue.

## 2022-08-15 ENCOUNTER — Telehealth: Payer: Self-pay

## 2022-08-15 NOTE — Progress Notes (Signed)
Patient ID: [name], female   DOB: 04-30-1956, 67 y.o.   MRN: [id number]  .Care Management & Coordination Services Pharmacy Team  Reason for Encounter: Hypertension  Contacted patient to discuss hypertension disease state. Spoke with patient on 08/23/2022     Current antihypertensive regimen:  Lisinopril 40 mg daily    Patient verbally confirms she is taking the above medications as directed. Yes  How often are you checking your Blood Pressure? 1-2x per week  she checks her blood pressure in the afternoon after taking her medication.  Current home BP readings: 124/62  Any readings above 180/100? No If yes any symptoms of hypertensive emergency? patient denies any symptoms of high blood pressure  What recent interventions/DTPs have been made by any provider to improve Blood Pressure control since last CPP Visit: None per PT  Any recent hospitalizations or ED visits since last visit with CPP? No  What diet changes have been made to improve Blood Pressure Control?  Patient reports she uses artifical sweeteners with her coffee, has water with flavor regular water and soda at times.   What exercise is being done to improve your Blood Pressure Control?  Patient expresses she can not exercise much due to pain  Adherence Review: Is the patient currently on ACE/ARB medication? Yes Does the patient have >5 day gap between last estimated fill dates? No  Star Rating Drugs:  Lisinopril (Zestril) 40 mg - Last filled 05/20/22 100 DS at [facility]   Chart Updates:  Recent office visits:  08/09/22 [name], [name], MD - Patient presented for essential hypertension and other concerns. Decreased Amitriptyline.   06/06/22 [name], [name], MD - Patient presented for Chronic right sided low back pain with right sided sciatica and other concerns. Prescribed Amitriptyline. Stopped Gabapentin.   Recent consult visits:  08/09/22 [name], Francesco Sor, RD (Nutrition) - Patient presented for Obesity  unspecified classification unspecified obesity type unspecified whether serious comorbidity present. No medication changes.  Hospital visits:  Medication Reconciliation was completed by comparing discharge summary, patient's EMR and Pharmacy list, and upon discussion with patient.  Patient presented to [facility] ED on 03/05/22 due to Hypotension.   New?Medications Started at [facility] Discharge:?? No medication changes  Medication Changes at Hospital Discharge: -Changed  none  Medications Discontinued at Hospital Discharge: -Stopped  none  Medications that remain the same after Hospital Discharge:??  -All other medications will remain the same.    Medications: Outpatient Encounter Medications as of 08/15/2022  Medication Sig   albuterol (VENTOLIN HFA) 108 (90 Base) MCG/ACT inhaler Inhale 1-2 puffs into the lungs every 6 (six) hours as needed for wheezing or shortness of breath.   alum & mag hydroxide-simeth (MAALOX MAX) 400-400-40 MG/5ML suspension Take 15 mLs by mouth every 6 (six) hours as needed for indigestion.   amitriptyline (ELAVIL) 25 MG tablet TAKE 0.5 TABLET BY MOUTH EVERYDAY AT BEDTIME   budesonide-formoterol (SYMBICORT) 160-4.5 MCG/ACT inhaler Inhale 2 puffs into the lungs 2 (two) times daily. (Patient taking differently: Inhale 2 puffs into the lungs daily as needed (Wheezing and Asthma).)   calcium carbonate (OS-CAL - DOSED IN MG OF ELEMENTAL CALCIUM) 1250 (500 Ca) MG tablet Take 1 tablet by mouth 2 (two) times daily with a meal.   Cholecalciferol (VITAMIN D) 50 MCG (2000 UT) CAPS Take 2,000 Units by mouth daily.   diclofenac Sodium (VOLTAREN) 1 % GEL Apply 2 g topically 4 (four) times daily.   lisinopril (ZESTRIL) 40 MG tablet TAKE 1 TABLET BY  MOUTH  DAILY   Multiple Vitamins-Minerals (BARIATRIC MULTIVITAMINS/IRON PO) Take 1 tablet by mouth daily.   ondansetron (ZOFRAN-ODT) 4 MG disintegrating tablet Take 1 tablet (4 mg total) by mouth every 6 (six) hours as needed for  nausea or vomiting.   pantoprazole (PROTONIX) 40 MG tablet Take 1 tablet (40 mg total) by mouth daily.   rosuvastatin (CRESTOR) 10 MG tablet Take 1 tablet (10 mg total) by mouth daily.   trolamine salicylate (ASPERCREME) 10 % cream Apply 1 application topically as needed for muscle pain.   No facility-administered encounter medications on file as of 08/15/2022.    Recent Office Vitals: BP Readings from Last 3 Encounters:  08/09/22 122/70  06/06/22 130/80  01/18/22 136/80   Pulse Readings from Last 3 Encounters:  08/09/22 67  06/06/22 84  01/18/22 73    Wt Readings from Last 3 Encounters:  08/09/22 256 lb 9.6 oz (116.4 kg)  08/09/22 256 lb 9.6 oz (116.4 kg)  06/06/22 292 lb (132.5 kg)     Kidney Function Lab Results  Component Value Date/Time   CREATININE 0.65 08/09/2022 11:17 AM   CREATININE 0.76 01/18/2022 04:28 PM   CREATININE 0.83 04/27/2020 01:07 PM   CREATININE 0.74 09/15/2017 03:19 PM   GFR 92.42 08/09/2022 11:17 AM   GFRNONAA >60 09/06/2021 03:52 AM   GFRNONAA 75 04/27/2020 01:07 PM   GFRAA 87 04/27/2020 01:07 PM       Latest Ref Rng & Units 08/09/2022   11:17 AM 01/18/2022    4:28 PM 09/13/2021   11:08 AM  BMP  Glucose 70 - 99 mg/dL 90  91  103   BUN 6 - 23 mg/dL 12  12  11   $ Creatinine 0.40 - 1.20 mg/dL 0.65  0.76  0.77   Sodium 135 - 145 mEq/L 139  140  139   Potassium 3.5 - 5.1 mEq/L 4.0  4.2  3.9   Chloride 96 - 112 mEq/L 105  107  105   CO2 19 - 32 mEq/L 27  25  24   $ Calcium 8.4 - 10.5 mg/dL 10.0  9.9  10.4       Care Gaps: Zoster Vaccine - Overdue TDAP - Overdue COVID Booster - Overdue PAP Smear - Postponed AWV- 03/02/22 Lab Results  Component Value Date   HGBA1C 5.4 08/09/2021      Star Rating Drug: Lisinopril (Zestril) 40 mg - Last filled 05/20/22 100 DS at [facility] Rosuvastatin (Crestor) 10 mg - Last filled 06/23/22 90 DS at [facility] Pharmacist Assistant [phone]

## 2022-09-15 DIAGNOSIS — M25552 Pain in left hip: Secondary | ICD-10-CM | POA: Diagnosis not present

## 2022-09-15 DIAGNOSIS — M1612 Unilateral primary osteoarthritis, left hip: Secondary | ICD-10-CM | POA: Diagnosis not present

## 2022-09-16 ENCOUNTER — Telehealth: Payer: Self-pay | Admitting: Family Medicine

## 2022-09-16 NOTE — Telephone Encounter (Signed)
Pt called to request  Prescription Request  09/16/2022  LOV: 08/09/2022  Pt called to inform MD that she would like to restart the following Rx:  What is the name of the medication or equipment? phentermine (ADIPEX-P) 37.5 MG tablet   Have you contacted your pharmacy to request a refill? No   Which pharmacy would you like this sent to?   [id number] [place] [facility] [address]: [phone] Fax: [phone]    Patient notified that their request is being sent to the clinical staff for review and that they should receive a response within 2 business days.   Please advise at Mobile [phone](mobile)

## 2022-09-16 NOTE — Telephone Encounter (Signed)
I spoke with patient. She is still working to lose weight and needs to lose 40 more pounds by May in order to get her hip surgery. She is wanting to know if you can refill her Phentermine that you prescribed her in the past?

## 2022-09-28 ENCOUNTER — Other Ambulatory Visit: Payer: Self-pay | Admitting: Orthopedic Surgery

## 2022-09-28 DIAGNOSIS — M25552 Pain in left hip: Secondary | ICD-10-CM

## 2022-10-07 ENCOUNTER — Ambulatory Visit
Admission: RE | Admit: 2022-10-07 | Discharge: 2022-10-07 | Disposition: A | Discharge status: Home / Self Care | Payer: Medicare Other | Source: Ambulatory Visit | Attending: Orthopedic Surgery | Admitting: Orthopedic Surgery

## 2022-10-07 DIAGNOSIS — M25552 Pain in left hip: Secondary | ICD-10-CM

## 2022-10-07 MED ORDER — METHYLPREDNISOLONE ACETATE 40 MG/ML INJ SUSP (RADIOLOG
80.0000 mg | Freq: Once | INTRAMUSCULAR | Status: AC
  Administered 2022-10-07: 80 mg via INTRA_ARTICULAR

## 2022-10-07 MED ORDER — IOPAMIDOL (ISOVUE-M 200) INJECTION 41%
1.0000 mL | Freq: Once | INTRAMUSCULAR | Status: AC
  Administered 2022-10-07: 1 mL via INTRA_ARTICULAR

## 2022-11-08 ENCOUNTER — Ambulatory Visit: Payer: Medicare Other | Admitting: Skilled Nursing Facility1

## 2022-11-24 NOTE — Progress Notes (Unsigned)
Care Management & Coordination Services Pharmacy Note  11/24/2022 Name:  [name] MRN:  [id number] DOB:  [birth year]  Summary: ***  Recommendations/Changes made from today's visit: ***  Follow up plan: ***   Subjective: [name] is an 67 y.o. year old female who is a primary patient of [name], Timoteo Expose, MD.  The care coordination team was consulted for assistance with disease management and care coordination needs.    {CCMTELEPHONEFACETOFACE:21091510} for follow up visit.  Recent office visits: 08/09/22 [name], [name], MD - Patient presented for essential hypertension and other concerns. Decreased Amitriptyline.    06/06/22 [name], [name], MD - Patient presented for Chronic right sided low back pain with right sided sciatica and other concerns. Prescribed Amitriptyline. Stopped Gabapentin.   Recent consult visits: 09/15/22 Durene Romans, MD Raechel Chute) - For osteoarthritis of left hip, no other visit details available  08/09/22 [name], [name], RD (Nutrition) - Patient presented for Obesity unspecified classification unspecified obesity type unspecified whether serious comorbidity present. No medication changes.    Hospital visits: 03/05/22 - [facility] ED - For Hypotension, no med changes   Objective:  Lab Results  Component Value Date   CREATININE 0.65 08/09/2022   BUN 12 08/09/2022   GFR 92.42 08/09/2022   EGFR 77 10/27/2020   GFRNONAA >60 09/06/2021   GFRAA 87 04/27/2020   NA 139 08/09/2022   K 4.0 08/09/2022   CALCIUM 10.0 08/09/2022   CO2 27 08/09/2022   GLUCOSE 90 08/09/2022    Lab Results  Component Value Date/Time   HGBA1C 5.4 08/09/2021 11:25 AM   HGBA1C 5.7 12/02/2019 09:13 AM   GFR 92.42 08/09/2022 11:17 AM   GFR 82.57 01/18/2022 04:28 PM   MICROALBUR 0.76 08/16/2007 09:19 PM    Last diabetic Eye exam: No results found for: "HMDIABEYEEXA"  Last diabetic Foot exam: No results found for: "HMDIABFOOTEX"   Lab Results  Component  Value Date   CHOL 161 09/13/2021   HDL 36.20 (L) 09/13/2021   LDLCALC 106 (H) 09/13/2021   TRIG 97.0 09/13/2021   CHOLHDL 4 09/13/2021       Latest Ref Rng & Units 09/05/2021    4:30 PM 08/09/2021   11:25 AM 10/27/2020    3:47 PM  Hepatic Function  Total Protein 6.5 - 8.1 g/dL 7.2  7.6  7.3   Albumin 3.5 - 5.0 g/dL 3.9  3.9  4.4   AST 15 - 41 U/L 23  15  11    ALT 0 - 44 U/L 18  11  8    Alk Phosphatase 38 - 126 U/L 70  86  122   Total Bilirubin 0.3 - 1.2 mg/dL 0.4  0.4  0.4     Lab Results  Component Value Date/Time   TSH 1.900 05/09/2018 10:07 AM   TSH 3.440 10/25/2017 08:59 AM   TSH 1.307 07/16/2014 12:02 PM   FREET4 1.20 05/09/2018 10:07 AM       Latest Ref Rng & Units 09/05/2021    4:30 PM 08/18/2021    4:26 AM 08/17/2021    4:45 AM  CBC  WBC 4.0 - 10.5 K/uL 6.0  7.9  10.5   Hemoglobin 12.0 - 15.0 g/dL 56.2  13.0  86.5   Hematocrit 36.0 - 46.0 % 38.4  38.1  37.2   Platelets 150 - 400 K/uL 390  296  329     Lab Results  Component Value Date/Time   VD25OH 35.77 01/18/2022 04:28 PM  VD25OH 27.67 (L) 09/13/2021 11:08 AM   VITAMINB12 >1504 (H) 09/13/2021 11:08 AM   VITAMINB12 111 (L) 09/29/2020 03:11 PM    Clinical ASCVD: No  The 10-year ASCVD risk score (Arnett DK, et al., 2019) is: 16.8%   Values used to calculate the score:     Age: 35 years     Sex: Female     Is Non-Hispanic African American: Yes     Diabetic: Yes     Tobacco smoker: No     Systolic Blood Pressure: 122 mmHg     Is BP treated: Yes     HDL Cholesterol: 36.2 mg/dL     Total Cholesterol: 161 mg/dL    ***Other: ([id number] if Afib, MMRC or CAT for COPD, ACT, DEXA)     08/09/2022   10:59 AM 06/06/2022   12:31 PM 03/02/2022    1:13 PM  Depression screen PHQ 2/9  Decreased Interest 0 1 0  Down, Depressed, Hopeless 0 0 0  PHQ - 2 Score 0 1 0  Altered sleeping 0 1 0  Tired, decreased energy 1 0 0  Change in appetite 0 0 0  Feeling bad or failure about yourself  0 1 0  Trouble  concentrating 0 0 0  Moving slowly or fidgety/restless 0 0 0  Suicidal thoughts 0 0 0  PHQ-9 Score 1 3 0  Difficult doing work/chores Somewhat difficult Very difficult Not difficult at all     Social History   Tobacco Use  Smoking Status Former   Packs/day: 0.50   Years: 20.00   Additional pack years: 0.00   Total pack years: 10.00   Types: Cigarettes   Quit date: 09/10/1998   Years since quitting: 24.2  Smokeless Tobacco Never  Tobacco Comments   67 yo    BP Readings from Last 3 Encounters:  08/09/22 122/70  06/06/22 130/80  01/18/22 136/80   Pulse Readings from Last 3 Encounters:  08/09/22 67  06/06/22 84  01/18/22 73   Wt Readings from Last 3 Encounters:  08/09/22 256 lb 9.6 oz (116.4 kg)  08/09/22 256 lb 9.6 oz (116.4 kg)  06/06/22 292 lb (132.5 kg)   BMI Readings from Last 3 Encounters:  08/09/22 42.70 kg/m  08/09/22 42.70 kg/m  06/06/22 48.59 kg/m    Allergies  Allergen Reactions   Ketorolac Tromethamine Shortness Of Breath and Palpitations   Atrovent [Ipratropium] Hives and Rash   Latex Rash and Other (See Comments)    NO POWDERED GLOVES!!!!   Tape Dermatitis    Irritates skin     Medications Reviewed Today     Reviewed by Kathreen Devoid, CMA (Certified Medical Assistant) on 08/09/22 at 1051  Med List Status: <None>   Medication Order Taking? Sig Documenting Provider Last Dose Status Informant  albuterol (VENTOLIN HFA) 108 (90 Base) MCG/ACT inhaler [id number]  Inhale 1-2 puffs into the lungs every 6 (six) hours as needed for wheezing or shortness of breath. [name], [name], MD  Active Self  alum & mag hydroxide-simeth (MAALOX MAX) 400-400-40 MG/5ML suspension [id number]  Take 15 mLs by mouth every 6 (six) hours as needed for indigestion. Glade Lloyd, MD  Active   amitriptyline (ELAVIL) 25 MG tablet [id number]  TAKE 1 TABLET BY MOUTH EVERYDAY AT BEDTIME [name], [name], MD  Active   budesonide-formoterol [facility]) 160-4.5 MCG/ACT inhaler  [id number]  Inhale 2 puffs into the lungs 2 (two) times daily.  Patient taking differently: Inhale 2 puffs into the lungs daily as  needed (Wheezing and Asthma).   [name], [name], MD  Active Self  calcium carbonate (OS-CAL - DOSED IN MG OF ELEMENTAL CALCIUM) 1250 (500 Ca) MG tablet [id number]  Take 1 tablet by mouth 2 (two) times daily with a meal. [provider]  Active Self  Cholecalciferol (VITAMIN D) 50 MCG (2000 UT) CAPS [id number]  Take 2,000 Units by mouth daily. [provider]  Active Self  diclofenac Sodium (VOLTAREN) 1 % GEL [id number]  Apply 2 g topically 4 (four) times daily. [provider]  Active   lisinopril (ZESTRIL) 40 MG tablet [id number]  TAKE 1 TABLET BY MOUTH  DAILY [name], [name], MD  Active   Multiple Vitamins-Minerals (BARIATRIC MULTIVITAMINS/IRON PO) [id number]  Take 1 tablet by mouth daily. [provider]  Active Self  ondansetron (ZOFRAN-ODT) 4 MG disintegrating tablet [id number]  Take 1 tablet (4 mg total) by mouth every 6 (six) hours as needed for nausea or vomiting. Stechschulte, Hyman Hopes, MD  Active Self  pantoprazole (PROTONIX) 40 MG tablet [id number]  Take 1 tablet (40 mg total) by mouth daily. Stechschulte, Hyman Hopes, MD  Active Self  rosuvastatin (CRESTOR) 10 MG tablet [id number]  Take 1 tablet (10 mg total) by mouth daily. [name], [name], MD  Active   trolamine salicylate (ASPERCREME) 10 % cream [id number]  Apply 1 application topically as needed for muscle pain. [provider]  Active Self            SDOH:  (Social Determinants of Health) assessments and interventions performed: {yes/no:20286} SDOH Interventions    Flowsheet Row Chronic Care Management from 05/30/2022 in [facility] [place] Telemedicine from 03/02/2022 in [facility] [place] [facility] [place] Chronic Care Management from 02/26/2020 in [facility] [place] Clinical Support from 10/05/2018 in [facility]  [place] [place] Office Visit from 09/29/2017 in [facility] [place] Clinical Support from 09/27/2017 in [facility] [place]  SDOH Interventions        Food Insecurity Interventions -- Intervention Not Indicated -- -- -- --  Housing Interventions -- Intervention Not Indicated -- -- -- --  Transportation Interventions -- Intervention Not Indicated Intervention Not Indicated -- -- --  Depression Interventions/Treatment  -- -- -- Patient refuses Treatment  [pt. sees dr. Dalbert Garnet for weight mgmt, does not want additional support at this time] Counseling Counseling  Financial Strain Interventions Intervention Not Indicated Intervention Not Indicated Intervention Not Indicated -- -- --  Stress Interventions -- Intervention Not Indicated -- -- -- --  Social Connections Interventions -- Intervention Not Indicated -- -- -- --       Medication Assistance: {MEDASSISTANCEINFO:25044}  Medication Access: Name and location of current pharmacy:  [id number] [place], [place] - [address] AT [facility] [address] [place] [address] Phone: [phone] Fax: [phone]  [facility] - [place] - [address] [place] [address] Phone: [phone] Fax: [phone]  Within the past 30 days, how often has patient missed a dose of medication? *** Is a pillbox or other method used to improve adherence? {YES/NO:21197} Factors that may affect medication adherence? {CHL DESC; BARRIERS:21522} Are meds synced by current pharmacy? {YES/NO:21197} Are meds delivered by current pharmacy? {YES/NO:21197} Does patient experience delays in picking up medications due to transportation concerns? {YES/NO:21197}  Compliance/Adherence/Medication fill history: Care Gaps: Shingles Vaccine - never completed Tdap  Vaccine - last 10/14/2010 COVID  Booster - last 06/07/2020  Star-Rating Drugs: Lisinopril 40mg  PDC 100% Rosuvastatin 10mg  PDC 91%   Assessment/Plan Hypertension (BP goal <130/80) -{US controlled/uncontrolled:25276} -Current treatment: Lisinopril 40mg  1 qd -Medications previously tried: Amlodipine, Lasix, HCTZ, Spironolactone -Current home readings: *** -Current dietary habits: *** -Current exercise habits: *** -{ACTIONS;DENIES/REPORTS:21021675::"Denies"} hypotensive/hypertensive symptoms -Educated on {CCM BP Counseling:25124} -Counseled to monitor BP at home ***, document, and provide log at future appointments -{CCMPHARMDINTERVENTION:25122}  Hyperlipidemia: (LDL goal < 100) -Uncontrolled -Current treatment: Rosuvastatin 10mg  1 qd -Medications previously tried: Pravastatin  -Current dietary patterns: *** -Current exercise habits: *** -Educated on {CCM HLD Counseling:25126} -{CCMPHARMDINTERVENTION:25122}  Sherrill Raring Clinical Pharmacist [phone]

## 2022-11-28 ENCOUNTER — Telehealth: Payer: Self-pay

## 2022-11-28 NOTE — Progress Notes (Signed)
Patient ID: [name], female   DOB: 07-05-1956, 67 y.o.   MRN: [id number]  Care Management & Coordination Services Pharmacy Team  Reason for Encounter: Appointment Reminder  Contacted patient to confirm telephone appointment with Milas Kocher, PharmD on 11/28/22 at 2. Spoke with patient on 11/28/2022    Hospital visits:  None in previous 6 months   Star Rating Drugs:  Lisinopril (Zestril) 40 mg - Last filled 11/26/22 100 DS at [facility] Rosuvastatin (Crestor) 10 mg - [facility]/22 [facility]   Care Gaps: Zoster Vaccine - Overdue TDAP - Overdue COVID Booster - Overdue AWV - 03/02/22 Pap Smear - Postponed   Pamala Duffel CMA Clinical Pharmacist Assistant [phone]

## 2022-12-02 ENCOUNTER — Telehealth: Payer: Self-pay

## 2022-12-02 NOTE — Progress Notes (Cosign Needed)
Patient ID: [name], female   DOB: [birth year], 67 y.o.   MRN: [id number] Care Management & Coordination Services Pharmacy Team  Reason for Encounter:Call to Pt offer to reschedule missed encounter with Johny Drilling D. Patient in agreement with 12/13/22 @ 11: 30 via phone.     Medications: Outpatient Encounter Medications as of 12/02/2022  Medication Sig   albuterol (VENTOLIN HFA) 108 (90 Base) MCG/ACT inhaler Inhale 1-2 puffs into the lungs every 6 (six) hours as needed for wheezing or shortness of breath.   alum & mag hydroxide-simeth (MAALOX MAX) 400-400-40 MG/5ML suspension Take 15 mLs by mouth every 6 (six) hours as needed for indigestion.   amitriptyline (ELAVIL) 25 MG tablet TAKE 0.5 TABLET BY MOUTH EVERYDAY AT BEDTIME   budesonide-formoterol (SYMBICORT) 160-4.5 MCG/ACT inhaler Inhale 2 puffs into the lungs 2 (two) times daily. (Patient taking differently: Inhale 2 puffs into the lungs daily as needed (Wheezing and Asthma).)   calcium carbonate (OS-CAL - DOSED IN MG OF ELEMENTAL CALCIUM) 1250 (500 Ca) MG tablet Take 1 tablet by mouth 2 (two) times daily with a meal.   Cholecalciferol (VITAMIN D) 50 MCG (2000 UT) CAPS Take 2,000 Units by mouth daily.   diclofenac Sodium (VOLTAREN) 1 % GEL Apply 2 g topically 4 (four) times daily.   lisinopril (ZESTRIL) 40 MG tablet TAKE 1 TABLET BY MOUTH  DAILY   Multiple Vitamins-Minerals (BARIATRIC MULTIVITAMINS/IRON PO) Take 1 tablet by mouth daily.   ondansetron (ZOFRAN-ODT) 4 MG disintegrating tablet Take 1 tablet (4 mg total) by mouth every 6 (six) hours as needed for nausea or vomiting.   pantoprazole (PROTONIX) 40 MG tablet Take 1 tablet (40 mg total) by mouth daily.   rosuvastatin (CRESTOR) 10 MG tablet Take 1 tablet (10 mg total) by mouth daily.   trolamine salicylate (ASPERCREME) 10 % cream Apply 1 application topically as needed for muscle pain.   No facility-administered encounter medications on file as of 12/02/2022.    Recent  vitals BP Readings from Last 3 Encounters:  08/09/22 122/70  06/06/22 130/80  01/18/22 136/80   Pulse Readings from Last 3 Encounters:  08/09/22 67  06/06/22 84  01/18/22 73   Wt Readings from Last 3 Encounters:  08/09/22 256 lb 9.6 oz (116.4 kg)  08/09/22 256 lb 9.6 oz (116.4 kg)  06/06/22 292 lb (132.5 kg)   BMI Readings from Last 3 Encounters:  08/09/22 42.70 kg/m  08/09/22 42.70 kg/m  06/06/22 48.59 kg/m    Recent lab results    Component Value Date/Time   NA 139 08/09/2022 1117   NA 141 10/27/2020 1547   K 4.0 08/09/2022 1117   CL 105 08/09/2022 1117   CO2 27 08/09/2022 1117   GLUCOSE 90 08/09/2022 1117   BUN 12 08/09/2022 1117   BUN 14 10/27/2020 1547   CREATININE 0.65 08/09/2022 1117   CREATININE 0.83 04/27/2020 1307   CALCIUM 10.0 08/09/2022 1117    Lab Results  Component Value Date   CREATININE 0.65 08/09/2022   GFR 92.42 08/09/2022   EGFR 77 10/27/2020   GFRNONAA >60 09/06/2021   GFRAA 87 04/27/2020   Lab Results  Component Value Date/Time   HGBA1C 5.4 08/09/2021 11:25 AM   HGBA1C 5.7 12/02/2019 09:13 AM   MICROALBUR 0.76 08/16/2007 09:19 PM    Lab Results  Component Value Date   CHOL 161 09/13/2021   HDL 36.20 (L) 09/13/2021   LDLCALC 106 (H) 09/13/2021   TRIG 97.0 09/13/2021   CHOLHDL 4  09/13/2021    Star Rating Drugs:  Lisinopril (Zestril) 40 mg - Last filled 11/26/22 100 DS at [facility] Rosuvastatin (Crestor) 10 mg - [facility]     Care Gaps: Zoster Vaccine - Overdue TDAP - Overdue COVID Booster - Overdue AWV - 03/02/22 Pap Smear - Postponed     Pamala Duffel CMA Clinical Pharmacist Assistant [phone]

## 2022-12-08 NOTE — Progress Notes (Signed)
Care Management & Coordination Services Pharmacy Note  12/13/2022 Name:  [name] MRN:  [id number] DOB:  December 29, 1955  Summary: BP at goal <130/80 LDL not at goal <100, due for update  Recommendations/Changes made from today's visit: -Counseled to continue to check BP at home 1-2x/week and keep a log -Counseled to be mindful of sodium intake and exercise goal of 139min/week  -Counseled on cholesterol-lowering diet and need for updated labwork, with PCP approval  Follow up plan: Med adherence review monthly BP/HLD review in 3 months Pharmacist visit in 6 months   Subjective: [name] is an 67 y.o. year old female who is a primary patient of [name], Timoteo Expose, MD.  The care coordination team was consulted for assistance with disease management and care coordination needs.    Engaged with patient by telephone for follow up visit.  Recent office visits: 08/09/22 [name], [name], MD - Patient presented for essential hypertension and other concerns. Decreased Amitriptyline.    06/06/22 [name], [name], MD - Patient presented for Chronic right sided low back pain with right sided sciatica and other concerns. Prescribed Amitriptyline. Stopped Gabapentin.   Recent consult visits: 09/15/22 Durene Romans, MD Raechel Chute) - For osteoarthritis of left hip, no other visit details available  08/09/22 [name], [name], RD (Nutrition) - Patient presented for Obesity unspecified classification unspecified obesity type unspecified whether serious comorbidity present. No medication changes.    Hospital visits: 03/05/22 - [facility] ED - For Hypotension, no med changes   Objective:  Lab Results  Component Value Date   CREATININE 0.65 08/09/2022   BUN 12 08/09/2022   GFR 92.42 08/09/2022   EGFR 77 10/27/2020   GFRNONAA >60 09/06/2021   GFRAA 87 04/27/2020   NA 139 08/09/2022   K 4.0 08/09/2022   CALCIUM 10.0 08/09/2022   CO2 27 08/09/2022   GLUCOSE 90 08/09/2022    Lab  Results  Component Value Date/Time   HGBA1C 5.4 08/09/2021 11:25 AM   HGBA1C 5.7 12/02/2019 09:13 AM   GFR 92.42 08/09/2022 11:17 AM   GFR 82.57 01/18/2022 04:28 PM   MICROALBUR 0.76 08/16/2007 09:19 PM    Last diabetic Eye exam: No results found for: "HMDIABEYEEXA"  Last diabetic Foot exam: No results found for: "HMDIABFOOTEX"   Lab Results  Component Value Date   CHOL 161 09/13/2021   HDL 36.20 (L) 09/13/2021   LDLCALC 106 (H) 09/13/2021   TRIG 97.0 09/13/2021   CHOLHDL 4 09/13/2021       Latest Ref Rng & Units 09/05/2021    4:30 PM 08/09/2021   11:25 AM 10/27/2020    3:47 PM  Hepatic Function  Total Protein 6.5 - 8.1 g/dL 7.2  7.6  7.3   Albumin 3.5 - 5.0 g/dL 3.9  3.9  4.4   AST 15 - 41 U/L 23  15  11    ALT 0 - 44 U/L 18  11  8    Alk Phosphatase 38 - 126 U/L 70  86  122   Total Bilirubin 0.3 - 1.2 mg/dL 0.4  0.4  0.4     Lab Results  Component Value Date/Time   TSH 1.900 05/09/2018 10:07 AM   TSH 3.440 10/25/2017 08:59 AM   TSH 1.307 07/16/2014 12:02 PM   FREET4 1.20 05/09/2018 10:07 AM       Latest Ref Rng & Units 09/05/2021    4:30 PM 08/18/2021    4:26 AM 08/17/2021    4:45 AM  CBC  WBC 4.0 - 10.5 K/uL 6.0  7.9  10.5   Hemoglobin 12.0 - 15.0 g/dL 16.1  09.6  04.5   Hematocrit 36.0 - 46.0 % 38.4  38.1  37.2   Platelets 150 - 400 K/uL 390  296  329     Lab Results  Component Value Date/Time   VD25OH 35.77 01/18/2022 04:28 PM   VD25OH 27.67 (L) 09/13/2021 11:08 AM   VITAMINB12 >1504 (H) 09/13/2021 11:08 AM   VITAMINB12 111 (L) 09/29/2020 03:11 PM    Clinical ASCVD: No  The 10-year ASCVD risk score (Arnett DK, et al., 2019) is: 16.8%   Values used to calculate the score:     Age: 40 years     Sex: Female     Is Non-Hispanic African American: Yes     Diabetic: Yes     Tobacco smoker: No     Systolic Blood Pressure: 122 mmHg     Is BP treated: Yes     HDL Cholesterol: 36.2 mg/dL     Total Cholesterol: 161 mg/dL         10/17/8117   14:78 AM  06/06/2022   12:31 PM 03/02/2022    1:13 PM  Depression screen PHQ 2/9  Decreased Interest 0 1 0  Down, Depressed, Hopeless 0 0 0  PHQ - 2 Score 0 1 0  Altered sleeping 0 1 0  Tired, decreased energy 1 0 0  Change in appetite 0 0 0  Feeling bad or failure about yourself  0 1 0  Trouble concentrating 0 0 0  Moving slowly or fidgety/restless 0 0 0  Suicidal thoughts 0 0 0  PHQ-9 Score 1 3 0  Difficult doing work/chores Somewhat difficult Very difficult Not difficult at all     Social History   Tobacco Use  Smoking Status Former   Packs/day: 0.50   Years: 20.00   Additional pack years: 0.00   Total pack years: 10.00   Types: Cigarettes   Quit date: 09/10/1998   Years since quitting: 24.2  Smokeless Tobacco Never  Tobacco Comments   67 yo    BP Readings from Last 3 Encounters:  08/09/22 122/70  06/06/22 130/80  01/18/22 136/80   Pulse Readings from Last 3 Encounters:  08/09/22 67  06/06/22 84  01/18/22 73   Wt Readings from Last 3 Encounters:  08/09/22 256 lb 9.6 oz (116.4 kg)  08/09/22 256 lb 9.6 oz (116.4 kg)  06/06/22 292 lb (132.5 kg)   BMI Readings from Last 3 Encounters:  08/09/22 42.70 kg/m  08/09/22 42.70 kg/m  06/06/22 48.59 kg/m    Allergies  Allergen Reactions   Ketorolac Tromethamine Shortness Of Breath and Palpitations   Atrovent [Ipratropium] Hives and Rash   Latex Rash and Other (See Comments)    NO POWDERED GLOVES!!!!   Tape Dermatitis    Irritates skin     Medications Reviewed Today     Reviewed by Sherrill Raring, RPH (Pharmacist) on 12/13/22 at 1112  Med List Status: <None>   Medication Order Taking? Sig Documenting Provider Last Dose Status Informant  albuterol (VENTOLIN HFA) 108 (90 Base) MCG/ACT inhaler [id number] No Inhale 1-2 puffs into the lungs every 6 (six) hours as needed for wheezing or shortness of breath. [name], [name], MD Taking Active Self  alum & mag hydroxide-simeth (MAALOX MAX) 400-400-40 MG/5ML suspension  [id number] No Take 15 mLs by mouth every 6 (six) hours as needed for indigestion. Glade Lloyd, MD Taking Active  amitriptyline (ELAVIL) 25 MG tablet [id number]  TAKE 0.5 TABLET BY MOUTH EVERYDAY AT BEDTIME [name], [name], MD  Active   budesonide-formoterol [facility]) 160-4.5 MCG/ACT inhaler [id number] No Inhale 2 puffs into the lungs 2 (two) times daily.  Patient taking differently: Inhale 2 puffs into the lungs daily as needed (Wheezing and Asthma).   [name], [name], MD Taking Active Self  calcium carbonate (OS-CAL - DOSED IN MG OF ELEMENTAL CALCIUM) 1250 (500 Ca) MG tablet [id number] No Take 1 tablet by mouth 2 (two) times daily with a meal. [provider] Taking Active Self  Cholecalciferol (VITAMIN D) 50 MCG (2000 UT) CAPS [id number] No Take 2,000 Units by mouth daily. [provider] Taking Active Self  diclofenac Sodium (VOLTAREN) 1 % GEL [id number] No Apply 2 g topically 4 (four) times daily. [provider] Taking Active   lisinopril (ZESTRIL) 40 MG tablet [id number] No TAKE 1 TABLET BY MOUTH  DAILY [name], [name], MD Taking Active   Multiple Vitamins-Minerals (BARIATRIC MULTIVITAMINS/IRON PO) [id number] No Take 1 tablet by mouth daily. [provider] Taking Active Self  ondansetron (ZOFRAN-ODT) 4 MG disintegrating tablet [id number] No Take 1 tablet (4 mg total) by mouth every 6 (six) hours as needed for nausea or vomiting. Stechschulte, Hyman Hopes, MD Taking Active Self  pantoprazole (PROTONIX) 40 MG tablet [id number] No Take 1 tablet (40 mg total) by mouth daily. Stechschulte, Hyman Hopes, MD Taking Active Self  rosuvastatin (CRESTOR) 10 MG tablet [id number] No Take 1 tablet (10 mg total) by mouth daily. [name], [name], MD Taking Active   trolamine salicylate (ASPERCREME) 10 % cream [id number] No Apply 1 application topically as needed for muscle pain. [provider] Taking Active Self            SDOH:  (Social Determinants of Health) assessments and  interventions performed: Yes SDOH Interventions    Flowsheet Row Care Coordination from 12/13/2022 in [facility] Chronic Care Management from 05/30/2022 in [facility] [place] Telemedicine from 03/02/2022 in [facility] [place] Chronic Care Management from 02/26/2020 in [facility] [place] Clinical Support from 10/05/2018 in [facility] [place] Office Visit from 09/29/2017 in [facility] [place]  SDOH Interventions        Food Insecurity Interventions Intervention Not Indicated -- Intervention Not Indicated -- -- --  Housing Interventions Intervention Not Indicated -- Intervention Not Indicated -- -- --  Transportation Interventions -- -- Intervention Not Indicated Intervention Not Indicated -- --  Depression Interventions/Treatment  -- -- -- -- Patient refuses Treatment  [pt. sees dr. Dalbert Garnet for weight mgmt, does not want additional support at this time] Counseling  Financial Strain Interventions -- Intervention Not Indicated Intervention Not Indicated Intervention Not Indicated -- --  Stress Interventions -- -- Intervention Not Indicated -- -- --  Social Connections Interventions -- -- Intervention Not Indicated -- -- --       Medication Assistance: None required.  Patient affirms current coverage meets needs.  Medication Access: Name and location of current pharmacy:  [id number] - [place], [place] - [address] AT [facility] [address] [place] [address] Phone: [phone] Fax: [phone]  [facility] - [place], [place] - [address] [place] [address] Phone: [phone] Fax: [phone]  Within the past 30 days, how often has patient missed a dose of  medication? None Is a pillbox or other method used to improve adherence? Yes  Factors  that may affect medication adherence? no barriers identified Are meds synced by current pharmacy? No  Are meds delivered by current pharmacy? No  Does patient experience delays in picking up medications due to transportation concerns? No   Compliance/Adherence/Medication fill history: Care Gaps: Shingles Vaccine - never completed Tdap Vaccine - last 10/14/2010 COVID Booster - last 06/07/2020  Star-Rating Drugs: Lisinopril 40mg  PDC 100% Rosuvastatin 10mg  PDC 91% - confirms she has and reports she takes daily with no missed doses   Assessment/Plan Hypertension (BP goal <130/80) -Controlled -Current treatment: Lisinopril 40mg  1 qd Appropriate, Effective, Safe, Accessible -Medications previously tried: Amlodipine, Lasix, HCTZ, Spironolactone -Current home readings: has a bp machine at home, 1-2x/week 132/64 -Current dietary habits: mindful of salt intake -Current exercise habits: move arounds the  house as much as she can, helps babysit which can keep her moving -Denies hypotensive/hypertensive symptoms -Educated on BP goals and benefits of medications for prevention of heart attack, stroke and kidney damage; Daily salt intake goal < 2300 mg; Exercise goal of 150 minutes per week; Importance of home blood pressure monitoring; Proper BP monitoring technique; -Counseled to monitor BP at home once weekly, document, and provide log at future appointments -Counseled on diet and exercise extensively Recommended to continue current medication  Hyperlipidemia: (LDL goal < 100) -Uncontrolled -Current treatment: Rosuvastatin 10mg  1 qd Appropriate, Query Effective -Medications previously tried: Pravastatin  -Current dietary patterns: tries to eat veggies, mindful of fried food intake -Current exercise habits: see above -Educated on Cholesterol goals;  Benefits of statin for ASCVD risk reduction; Importance of limiting foods high in cholesterol; Exercise goal of 150 minutes per  week; -Recommended to continue current medication Recommended updated lipid panel at next PCP visit in July. If still elevated, consider statin dose increase  Sherrill Raring Clinical Pharmacist [phone]

## 2022-12-12 ENCOUNTER — Telehealth: Payer: Self-pay

## 2022-12-12 NOTE — Progress Notes (Signed)
Patient ID: [name], female   DOB: [birth year], 67 y.o.   MRN: [id number] Care Management & Coordination Services Pharmacy Team  Reason for Encounter: Appointment Reminder  Contacted patient to confirm telephone appointment with Milas Kocher, PharmD on 12/13/22 at 11:30. Spoke with patient on 12/12/2022      Star Rating Drugs:  Lisinopril (Zestril) 40 mg - Last filled 11/26/22 100 DS at [facility] Rosuvastatin (Crestor) 10 mg - [facility]   Care Gaps: Zoster Vaccine - Overdue TDAP - Overdue COVID Booster - Overdue Hepatitis C Screen - Overdue AWV - 03/02/22 Pap Smear - overdue   Pamala Duffel CMA Clinical Pharmacist Assistant [phone]

## 2022-12-13 ENCOUNTER — Ambulatory Visit: Payer: Medicare Other

## 2022-12-20 ENCOUNTER — Ambulatory Visit: Payer: Medicare Other | Admitting: Skilled Nursing Facility1

## 2022-12-26 ENCOUNTER — Ambulatory Visit: Payer: Medicare Other | Admitting: Skilled Nursing Facility1

## 2023-01-06 NOTE — Progress Notes (Unsigned)
HPI: [name] is a 67 y.o. female, who is here today with her husband for chronic disease management.  Last seen on 08/09/22. No new problems since her last visit.  Last visit amitriptyline dose was decreased from 25 mg to 1/2 tablet at bedtime for chronic lower back pain with right-sided radiculopathy. Her current usage pattern involves taking amitriptyline twice per week, and she has noticed some improvement in her right LE pain, although she is unsure if this improvement is attributable to the medication or weight loss. Generalized OA, specially hips and knees. She took Duloxetine before, she doe snot recall any adverse reaction.  She follows with ortho.  Mentions feeling fatigue, hx of OSA but due to insurance limitations, she is unable to obtain a new CPAP machine until October. Her current CPAP machine is not functioning. She reports sleeping an average of six to seven hours per night. Vit D deficiency: She is taking vitamin D3 supplementation at a dose of 2,000 units.    Asthma: She has not had symptoms in a while. She is using her Symbicort 160-4.5 mcg bid prn as well as Albuterol inh.  Hypertension: Currently on Lisinopril 40 mg daily. Negative for unusual or severe headache, visual changes, exertional chest pain, dyspnea,  focal weakness, or edema.  Lab Results  Component Value Date   CREATININE 0.65 08/09/2022   BUN 12 08/09/2022   NA 139 08/09/2022   K 4.0 08/09/2022   CL 105 08/09/2022   CO2 27 08/09/2022   HLD: She is on Rosuvastatin 10 mg daily. Lab Results  Component Value Date   CHOL 161 09/13/2021   HDL 36.20 (L) 09/13/2021   LDLCALC 106 (H) 09/13/2021   TRIG 97.0 09/13/2021   CHOLHDL 4 09/13/2021   Lab Results  Component Value Date   WBC 6.0 09/05/2021   HGB 12.2 09/05/2021   HCT 38.4 09/05/2021   MCV 89.5 09/05/2021   PLT 390 09/05/2021   Vit D deficiency: Sh eis on OTC Vit D 2000 U daily.  Review of Systems  Constitutional:  Positive  for fatigue. Negative for chills and fever.  HENT:  Negative for mouth sores and sore throat.   Respiratory:  Negative for cough and wheezing.   Gastrointestinal:  Negative for abdominal pain, nausea and vomiting.  Endocrine: Negative for cold intolerance and heat intolerance.  Genitourinary:  Negative for decreased urine volume, dysuria and hematuria.  Musculoskeletal:  Positive for arthralgias, back pain and gait problem.  Skin:  Negative for rash.  Neurological:  Negative for syncope and facial asymmetry.  See other pertinent positives and negatives in HPI.  Current Outpatient Medications on File Prior to Visit  Medication Sig Dispense Refill   albuterol (VENTOLIN HFA) 108 (90 Base) MCG/ACT inhaler Inhale 1-2 puffs into the lungs every 6 (six) hours as needed for wheezing or shortness of breath. 3 each 0   alum & mag hydroxide-simeth (MAALOX MAX) 400-400-40 MG/5ML suspension Take 15 mLs by mouth every 6 (six) hours as needed for indigestion. 355 mL 1   budesonide-formoterol (SYMBICORT) 160-4.5 MCG/ACT inhaler Inhale 2 puffs into the lungs 2 (two) times daily. (Patient taking differently: Inhale 2 puffs into the lungs daily as needed (Wheezing and Asthma).) 3 each 4   calcium carbonate (OS-CAL - DOSED IN MG OF ELEMENTAL CALCIUM) 1250 (500 Ca) MG tablet Take 1 tablet by mouth 2 (two) times daily with a meal.     Cholecalciferol (VITAMIN D) 50 MCG (2000 UT) CAPS Take 2,000  Units by mouth daily.     diclofenac Sodium (VOLTAREN) 1 % GEL Apply 2 g topically 4 (four) times daily.     lisinopril (ZESTRIL) 40 MG tablet TAKE 1 TABLET BY MOUTH  DAILY 90 tablet 3   Multiple Vitamins-Minerals (BARIATRIC MULTIVITAMINS/IRON PO) Take 1 tablet by mouth daily.     ondansetron (ZOFRAN-ODT) 4 MG disintegrating tablet Take 1 tablet (4 mg total) by mouth every 6 (six) hours as needed for nausea or vomiting. 20 tablet 0   pantoprazole (PROTONIX) 40 MG tablet Take 1 tablet (40 mg total) by mouth daily. 90 tablet 0    rosuvastatin (CRESTOR) 10 MG tablet Take 1 tablet (10 mg total) by mouth daily. 90 tablet 3   trolamine salicylate (ASPERCREME) 10 % cream Apply 1 application topically as needed for muscle pain.     No current facility-administered medications on file prior to visit.    Past Medical History:  Diagnosis Date   Abnormal EKG 06/2003   History of inverted T waves V1-V3. Normal 2D echo (07/23/2003): LVEF 65%.   Anemia    BL Hgb 11-12. Ferritin 14 - low normal (08/2007). Colonoscopy 2009 - external hemorrhoids (excellent prep). Last anemia panel (12/2010) - Iron  24, TIBC 269,  B12  316, Folate 11.9, Ferritin 73.   Asthma    B12 deficiency    Back pain    Chest pain    Degenerative joint disease    BL knees (L>R), lumbar spine. Followed by Sports Medicine, Dr. Jennette Kettle.   Dyspnea    Gastric bypass status for obesity    History of multiple pulmonary nodules    Incidental finding: CT Abd/ Pelvis (04/2010) - Several small lower lobe lung nodules, including one pure ground-glass pulmonary nodule measuring 8 mm in the left lower lobe.  Recommend follow-up chest CT (IV contrast preferred) in 6 months to document stability. //  CT Abd/ Pelvis (07/2010) -  3 mm RLL and  8 mm LLL nodule stable.  Other nodules unchanged, likely benign.   Hyperlipidemia    Hypertension    Incarcerated ventral hernia 04/2010   Noted on CT Abd/ pelvis (04/2010). Patient now s/p ventral hernia repair by Dr. 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 2D echo (2005) - EF 65%.   Obesity    OSA (obstructive sleep apnea)    Palpitations    Positive D dimer    Pre-diabetes    Right knee pain    Allergies  Allergen Reactions   Ketorolac Tromethamine Shortness Of Breath and Palpitations   Atrovent [Ipratropium] Hives and Rash   Latex Rash and Other (See  Comments)    NO POWDERED GLOVES!!!!   Tape Dermatitis    Irritates skin     Social History   Socioeconomic History   Marital status: Married    Spouse name: Jeron   Number of children: 3   Years of education: Not on file   Highest education level: 12th grade  Occupational History   Occupation: Stay at home  Tobacco Use   Smoking status: Former    Packs/day: 0.50    Years: 20.00    Additional pack years: 0.00    Total pack years: 10.00    Types: Cigarettes    Quit date: 09/10/1998  Years since quitting: 24.3   Smokeless tobacco: Never   Tobacco comments:    67 yo   Vaping Use   Vaping Use: Never used  Substance and Sexual Activity   Alcohol use: No    Alcohol/week: 0.0 standard drinks of alcohol   Drug use: No   Sexual activity: Yes    Partners: Male  Other Topics Concern   Not on file  Social History Narrative   10/05/2018: Lives with husband, daughter, and 2 grandchildren in [place] house. Lives on main level though mostly.   Has three children altogether, all local, supportive   Currently getting ramp installed in house d/t pt difficulty getting upstairs.      Social Determinants of Health   Financial Resource Strain: Low Risk  (08/09/2022)   Overall Financial Resource Strain (CARDIA)    Difficulty of Paying Living Expenses: Not hard at all  Food Insecurity: No Food Insecurity (12/13/2022)   Hunger Vital Sign    Worried About Running Out of Food in the Last Year: Never true    Ran Out of Food in the Last Year: Never true  Transportation Needs: No Transportation Needs (08/09/2022)   PRAPARE - Administrator, Civil Service (Medical): No    Lack of Transportation (Non-Medical): No  Physical Activity: Unknown (08/09/2022)   Exercise Vital Sign    Days of Exercise per Week: 2 days    Minutes of Exercise per Session: Not on file  Stress: No Stress Concern Present (08/09/2022)   [employer] of Occupational Health - Occupational Stress Questionnaire     Feeling of Stress : Not at all  Social Connections: Socially Integrated (08/09/2022)   Social Connection and Isolation Panel [NHANES]    Frequency of Communication with Friends and Family: Once a week    Frequency of Social Gatherings with Friends and Family: Three times a week    Attends Religious Services: More than 4 times per year    Active Member of Clubs or Organizations: Yes    Attends Banker Meetings: More than 4 times per year    Marital Status: Married   Vitals:   01/09/23 1033  BP: 122/78  Pulse: 65  Resp: 16  SpO2: 99%   Wt Readings from Last 3 Encounters:  08/09/22 256 lb 9.6 oz (116.4 kg)  08/09/22 256 lb 9.6 oz (116.4 kg)  06/06/22 292 lb (132.5 kg)   Body mass index is 42.7 kg/m.  Physical Exam Vitals and nursing note reviewed.  Constitutional:      General: She is not in acute distress.    Appearance: She is well-developed.  HENT:     Head: Normocephalic and atraumatic.  Eyes:     Conjunctiva/sclera: Conjunctivae normal.  Cardiovascular:     Rate and Rhythm: Normal rate and regular rhythm.     Heart sounds: No murmur heard. Pulmonary:     Effort: Pulmonary effort is normal. No respiratory distress.     Breath sounds: Normal breath sounds.  Abdominal:     Palpations: Abdomen is soft. There is no mass.     Tenderness: There is no abdominal tenderness.  Musculoskeletal:     Right lower leg: No edema.     Left lower leg: No edema.  Skin:    General: Skin is warm.     Findings: No erythema or rash.  Neurological:     General: No focal deficit present.     Mental Status: She is alert and oriented to  person, place, and time.     Cranial Nerves: No cranial nerve deficit.     Comments: In a wheel chair.  Psychiatric:        Mood and Affect: Mood and affect normal.   ASSESSMENT AND PLAN:  [name] was seen today for medical management of chronic issues.  Diagnoses and all orders for this visit:  Lab Results  Component Value  Date   CREATININE 0.74 01/09/2023   BUN 12 01/09/2023   NA 138 01/09/2023   K 3.8 01/09/2023   CL 105 01/09/2023   CO2 25 01/09/2023   Lab Results  Component Value Date   CHOL 202 (H) 01/09/2023   HDL 50.80 01/09/2023   LDLCALC 138 (H) 01/09/2023   TRIG 66.0 01/09/2023   CHOLHDL 4 01/09/2023   Lab Results  Component Value Date   TSH 2.48 01/09/2023   Lab Results  Component Value Date   WBC 3.8 (L) 01/09/2023   HGB 13.3 01/09/2023   HCT 41.2 01/09/2023   MCV 91.5 01/09/2023   PLT 291.0 01/09/2023   Radiculopathy due to lumbar intervertebral disc disorder Assessment & Plan: She noticed some improvement with amitriptyline but she unable to take medication daily due to drowsiness the following day. Discontinue amitriptyline. She would like to try duloxetine 30 mg daily, he can be increased to 60 mg daily in 4 weeks if well-tolerated. She is following with orthopedics later this month.  Orders: -     DULoxetine HCl; Take 1 capsule (20 mg total) by mouth daily.  Dispense: 90 capsule; Refill: 0  Generalized osteoarthritis of multiple sites (left hip,knee,and lower back) Assessment & Plan: Duloxetine 30 mg started today. Instructed to let me know in 4 weeks if she is tolerating medication well, in which case we can try to increase duloxetine dose to 60 mg daily. Low impact exercise. Fall precautions to continue.  Orders: -     DULoxetine HCl; Take 1 capsule (20 mg total) by mouth daily.  Dispense: 90 capsule; Refill: 0  Hyperlipidemia, unspecified hyperlipidemia type Assessment & Plan: Continue rosuvastatin 10 mg daily and low-fat diet. Further recommendation will be given according to FLP results.  Orders: -     Comprehensive metabolic panel; Future -     Lipid panel; Future  Essential hypertension Assessment & Plan: BP adequately controlled. Continue lisinopril 40 mg daily as well as low salt diet.  Orders: -     Comprehensive metabolic panel; Future -      TSH; Future -     CBC; Future  Vitamin D deficiency, unspecified Assessment & Plan: Continue OTC vitamin D 2000 units daily. Further recommendation will be given according to 25 [place] vitamin D result.  Orders: -     VITAMIN D 25 Hydroxy (Vit-D Deficiency, Fractures); Future  Obstructive sleep apnea Assessment & Plan: She is not wearing a CPAP, hers is not working. According to patient, she has to wait until October this year to be able to get a new CPAP machine. She is following with pulmonologist.   Asthma in adult, mild intermittent, uncomplicated Assessment & Plan: Problem is well controlled. Continue Symbicort 160-4.5 mcg bid prn and Albuterol inh qid prn.   Return in about 5 months (around 06/11/2023) for chronic problems.  Danyetta Gillham G. [name], MD  [facility]. [place] office.

## 2023-01-09 ENCOUNTER — Encounter: Payer: Self-pay | Admitting: Family Medicine

## 2023-01-09 ENCOUNTER — Ambulatory Visit (INDEPENDENT_AMBULATORY_CARE_PROVIDER_SITE_OTHER): Payer: Medicare Other | Admitting: Family Medicine

## 2023-01-09 VITALS — BP 122/78 | HR 65 | Resp 16 | Ht 65.0 in

## 2023-01-09 DIAGNOSIS — E785 Hyperlipidemia, unspecified: Secondary | ICD-10-CM | POA: Diagnosis not present

## 2023-01-09 DIAGNOSIS — G4733 Obstructive sleep apnea (adult) (pediatric): Secondary | ICD-10-CM | POA: Diagnosis not present

## 2023-01-09 DIAGNOSIS — M5116 Intervertebral disc disorders with radiculopathy, lumbar region: Secondary | ICD-10-CM | POA: Diagnosis not present

## 2023-01-09 DIAGNOSIS — I10 Essential (primary) hypertension: Secondary | ICD-10-CM

## 2023-01-09 DIAGNOSIS — F321 Major depressive disorder, single episode, moderate: Secondary | ICD-10-CM

## 2023-01-09 DIAGNOSIS — J4520 Mild intermittent asthma, uncomplicated: Secondary | ICD-10-CM | POA: Diagnosis not present

## 2023-01-09 DIAGNOSIS — M159 Polyosteoarthritis, unspecified: Secondary | ICD-10-CM | POA: Diagnosis not present

## 2023-01-09 DIAGNOSIS — E559 Vitamin D deficiency, unspecified: Secondary | ICD-10-CM

## 2023-01-09 LAB — COMPREHENSIVE METABOLIC PANEL
ALT: 7 U/L (ref 0–35)
AST: 15 U/L (ref 0–37)
Albumin: 4 g/dL (ref 3.5–5.2)
Alkaline Phosphatase: 91 U/L (ref 39–117)
BUN: 12 mg/dL (ref 6–23)
CO2: 25 mEq/L (ref 19–32)
Calcium: 10.5 mg/dL (ref 8.4–10.5)
Chloride: 105 mEq/L (ref 96–112)
Creatinine, Ser: 0.74 mg/dL (ref 0.40–1.20)
GFR: 84.68 mL/min (ref >60.00)
Glucose, Bld: 78 mg/dL (ref 70–99)
Potassium: 3.8 mEq/L (ref 3.5–5.1)
Sodium: 138 mEq/L (ref 135–145)
Total Bilirubin: 0.5 mg/dL (ref 0.2–1.2)
Total Protein: 7.3 g/dL (ref 6.0–8.3)

## 2023-01-09 LAB — VITAMIN D 25 HYDROXY (VIT D DEFICIENCY, FRACTURES): VITD: 37.59 ng/mL (ref 30.00–100.00)

## 2023-01-09 LAB — CBC
HCT: 41.2 % (ref 36.0–46.0)
Hemoglobin: 13.3 g/dL (ref 12.0–15.0)
MCHC: 32.2 g/dL (ref 30.0–36.0)
MCV: 91.5 fl (ref 78.0–100.0)
Platelets: 291 10*3/uL (ref 150.0–400.0)
RBC: 4.5 Mil/uL (ref 3.87–5.11)
RDW: 14.4 % (ref 11.5–15.5)
WBC: 3.8 10*3/uL — ABNORMAL LOW (ref 4.0–10.5)

## 2023-01-09 LAB — LIPID PANEL
Cholesterol: 202 mg/dL — ABNORMAL HIGH (ref 0–200)
HDL: 50.8 mg/dL (ref >39.00)
LDL Cholesterol: 138 mg/dL — ABNORMAL HIGH (ref 0–99)
NonHDL: 151.58
Total CHOL/HDL Ratio: 4
Triglycerides: 66 mg/dL (ref 0.0–149.0)
VLDL: 13.2 mg/dL (ref 0.0–40.0)

## 2023-01-09 LAB — TSH: TSH: 2.48 u[IU]/mL (ref 0.35–5.50)

## 2023-01-09 MED ORDER — ROSUVASTATIN CALCIUM 10 MG PO TABS: 10.0000 mg | ORAL_TABLET | Freq: Every day | ORAL | 3 refills | Status: DC

## 2023-01-09 MED ORDER — DULOXETINE HCL 20 MG PO CPEP: 20.0000 mg | ORAL_CAPSULE | Freq: Every day | ORAL | 0 refills | Status: DC

## 2023-01-09 NOTE — Assessment & Plan Note (Signed)
She is not wearing a CPAP, hers is not working. According to patient, she has to wait until October this year to be able to get a new CPAP machine. She is following with pulmonologist.

## 2023-01-09 NOTE — Assessment & Plan Note (Signed)
She noticed some improvement with amitriptyline but she unable to take medication daily due to drowsiness the following day. Discontinue amitriptyline. She would like to try duloxetine 30 mg daily, he can be increased to 60 mg daily in 4 weeks if well-tolerated. She is following with orthopedics later this month.

## 2023-01-09 NOTE — Assessment & Plan Note (Signed)
Continue rosuvastatin 10 mg daily and low-fat diet. Further recommendation will be given according to FLP results.

## 2023-01-09 NOTE — Assessment & Plan Note (Signed)
Continue OTC vitamin D 2000 units daily. Further recommendation will be given according to 25 OH vitamin D result. 

## 2023-01-09 NOTE — Assessment & Plan Note (Signed)
Problem is well controlled. Continue Symbicort 160-4.5 mcg bid prn and Albuterol inh qid prn.

## 2023-01-09 NOTE — Patient Instructions (Addendum)
A few things to remember from today's visit:  Essential hypertension - Plan: Comprehensive metabolic panel, TSH, CBC  Depression, major, single episode, moderate (HCC)  Generalized osteoarthritis of multiple sites (left hip,knee,and lower back)  Hyperlipidemia, unspecified hyperlipidemia type - Plan: Comprehensive metabolic panel, Lipid panel  Vitamin D deficiency, unspecified - Plan: VITAMIN D 25 Hydroxy (Vit-D Deficiency, Fractures)  Stop Amitriptyline. Start Duloxetine 30 mg daily and in 4 weeks let me know if you want to try 60 mg daily. No changes in rest.  If you need refills for medications you take chronically, please call your pharmacy. Do not use My Chart to request refills or for acute issues that need immediate attention. If you send a my chart message, it may take a few days to be addressed, specially if I am not in the office.  Please be sure medication list is accurate. If a new problem present, please set up appointment sooner than planned today.

## 2023-01-09 NOTE — Assessment & Plan Note (Signed)
Duloxetine 30 mg started today. Instructed to let me know in 4 weeks if she is tolerating medication well, in which case we can try to increase duloxetine dose to 60 mg daily. Low impact exercise. Fall precautions to continue.

## 2023-01-09 NOTE — Assessment & Plan Note (Signed)
BP adequately controlled. Continue lisinopril 40 mg daily as well as low salt diet.

## 2023-01-31 ENCOUNTER — Other Ambulatory Visit: Payer: Self-pay | Admitting: Family Medicine

## 2023-01-31 DIAGNOSIS — I10 Essential (primary) hypertension: Secondary | ICD-10-CM

## 2023-02-08 ENCOUNTER — Encounter (INDEPENDENT_AMBULATORY_CARE_PROVIDER_SITE_OTHER): Payer: Self-pay

## 2023-02-08 DIAGNOSIS — H0102B Squamous blepharitis left eye, upper and lower eyelids: Secondary | ICD-10-CM | POA: Diagnosis not present

## 2023-02-08 DIAGNOSIS — H43812 Vitreous degeneration, left eye: Secondary | ICD-10-CM | POA: Diagnosis not present

## 2023-02-08 DIAGNOSIS — H04123 Dry eye syndrome of bilateral lacrimal glands: Secondary | ICD-10-CM | POA: Diagnosis not present

## 2023-02-08 DIAGNOSIS — H25813 Combined forms of age-related cataract, bilateral: Secondary | ICD-10-CM | POA: Diagnosis not present

## 2023-02-08 DIAGNOSIS — H0102A Squamous blepharitis right eye, upper and lower eyelids: Secondary | ICD-10-CM | POA: Diagnosis not present

## 2023-03-06 ENCOUNTER — Ambulatory Visit (INDEPENDENT_AMBULATORY_CARE_PROVIDER_SITE_OTHER): Payer: Medicare Other

## 2023-03-06 VITALS — Ht 65.0 in | Wt 256.0 lb

## 2023-03-06 DIAGNOSIS — Z0000 Encounter for general adult medical examination without abnormal findings: Secondary | ICD-10-CM

## 2023-03-06 NOTE — Patient Instructions (Addendum)
[name] , Thank you for taking time to come for your Medicare Wellness Visit. I appreciate your ongoing commitment to your health goals. Please review the following plan we discussed and let me know if I can assist you in the future.   Referrals/Orders/Follow-Ups/Clinician Recommendations:   This is a list of the screening recommended for you and due dates:  Health Maintenance  Topic Date Due   Zoster (Shingles) Vaccine (1 of 2) Never done   DTaP/Tdap/Td vaccine (2 - Td or Tdap) 10/13/2020   COVID-19 Vaccine (3 - 2023-24 season) 03/11/2022   Flu Shot  02/09/2023   Pap Smear  01/21/2024*   Mammogram  05/01/2023   Medicare Annual Wellness Visit  03/05/2024   Colon Cancer Screening  12/19/2027   Pneumonia Vaccine  Completed   DEXA scan (bone density measurement)  Completed   Hepatitis C Screening  Completed   HIV Screening  Completed   HPV Vaccine  Aged Out  *Topic was postponed. The date shown is not the original due date.    Advanced directives: (Declined) Advance directive discussed with you today. Even though you declined this today, please call our office should you change your mind, and we can give you the proper paperwork for you to fill out.  Next Medicare Annual Wellness Visit scheduled for next year: Yes

## 2023-03-06 NOTE — Progress Notes (Signed)
Subjective:   [name] is a 67 y.o. female who presents for Medicare Annual (Subsequent) preventive examination.  Visit Complete: Virtual  I connected with  [name] on 03/06/23 by a audio enabled telemedicine application and verified that I am speaking with the correct person using two identifiers.  Patient Location: Home  Provider Location: Home Office  I discussed the limitations of evaluation and management by telemedicine. The patient expressed understanding and agreed to proceed.     Review of Systems    Vital Signs: Unable to obtain new vitals due to this being a telehealth visit.  Cardiac Risk Factors include: advanced age (>88men, >71 women);hypertension;sedentary lifestyle     Objective:    Today's Vitals   03/06/23 1543  Weight: 256 lb (116.1 kg)  Height: 5\' 5"  (1.651 m)   Body mass index is 42.6 kg/m.     03/06/2023    3:54 PM 03/02/2022    1:19 PM 09/05/2021   10:33 PM 09/05/2021    3:58 PM 08/18/2021    6:00 PM 08/09/2021   11:02 AM 04/23/2021   11:55 AM  Advanced Directives  Does Patient Have a Medical Advance Directive? No No No No No No No  Would patient like information on creating a medical advance directive? No - Patient declined No - Patient declined No - Patient declined  No - Patient declined  No - Patient declined    Current Medications (verified) Outpatient Encounter Medications as of 03/06/2023  Medication Sig   albuterol (VENTOLIN HFA) 108 (90 Base) MCG/ACT inhaler Inhale 1-2 puffs into the lungs every 6 (six) hours as needed for wheezing or shortness of breath.   alum & mag hydroxide-simeth (MAALOX MAX) 400-400-40 MG/5ML suspension Take 15 mLs by mouth every 6 (six) hours as needed for indigestion.   budesonide-formoterol (SYMBICORT) 160-4.5 MCG/ACT inhaler Inhale 2 puffs into the lungs 2 (two) times daily. (Patient taking differently: Inhale 2 puffs into the lungs daily as needed (Wheezing and Asthma).)   calcium carbonate (OS-CAL  - DOSED IN MG OF ELEMENTAL CALCIUM) 1250 (500 Ca) MG tablet Take 1 tablet by mouth 2 (two) times daily with a meal.   Cholecalciferol (VITAMIN D) 50 MCG (2000 UT) CAPS Take 2,000 Units by mouth daily.   diclofenac Sodium (VOLTAREN) 1 % GEL Apply 2 g topically 4 (four) times daily.   DULoxetine (CYMBALTA) 20 MG capsule Take 1 capsule (20 mg total) by mouth daily.   lisinopril (ZESTRIL) 40 MG tablet TAKE 1 TABLET BY MOUTH DAILY   Multiple Vitamins-Minerals (BARIATRIC MULTIVITAMINS/IRON PO) Take 1 tablet by mouth daily.   ondansetron (ZOFRAN-ODT) 4 MG disintegrating tablet Take 1 tablet (4 mg total) by mouth every 6 (six) hours as needed for nausea or vomiting.   pantoprazole (PROTONIX) 40 MG tablet Take 1 tablet (40 mg total) by mouth daily.   rosuvastatin (CRESTOR) 10 MG tablet Take 1 tablet (10 mg total) by mouth daily.   trolamine salicylate (ASPERCREME) 10 % cream Apply 1 application topically as needed for muscle pain.   No facility-administered encounter medications on file as of 03/06/2023.    Allergies (verified) Ketorolac tromethamine, Atrovent [ipratropium], Latex, and Tape   History: Past Medical History:  Diagnosis Date   Abnormal EKG 06/2003   History of inverted T waves V1-V3. Normal 2D echo (07/23/2003): LVEF 65%.   Anemia    BL Hgb 11-12. Ferritin 14 - low normal (08/2007). Colonoscopy 2009 - external hemorrhoids (excellent prep). Last anemia panel (12/2010) -  Iron  24, TIBC 269,  B12  316, Folate 11.9, Ferritin 73.   Asthma    B12 deficiency    Back pain    Chest pain    Degenerative joint disease    BL knees (L>R), lumbar spine. Followed by Sports Medicine, Dr. Jennette Kettle.   Dyspnea    Gastric bypass status for obesity    History of multiple pulmonary nodules    Incidental finding: CT Abd/ Pelvis (04/2010) - Several small lower lobe lung nodules, including one pure ground-glass pulmonary nodule measuring 8 mm in the left lower lobe.  Recommend follow-up chest CT (IV contrast  preferred) in 6 months to document stability. //  CT Abd/ Pelvis (07/2010) -  3 mm RLL and  8 mm LLL nodule stable.  Other nodules unchanged, likely benign.   Hyperlipidemia    Hypertension    Incarcerated ventral hernia 04/2010   Noted on CT Abd/ pelvis (04/2010). Patient now s/p ventral hernia repair by Dr. 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 2D echo (2005) - EF 65%.   Obesity    OSA (obstructive sleep apnea)    Palpitations    Positive D dimer    Pre-diabetes    Right knee pain    Past Surgical History:  Procedure Laterality Date   BIOPSY  04/23/2021   Procedure: BIOPSY;  Surgeon: Quentin Ore, MD;  Location: [facility];  Service: General;;   COLONOSCOPY  2009   COLONOSCOPY WITH PROPOFOL N/A 12/18/2017   Procedure: COLONOSCOPY WITH PROPOFOL;  Surgeon: Iva Boop, MD;  Location: [facility];  Service: Endoscopy;  Laterality: N/A;   ESOPHAGOGASTRODUODENOSCOPY N/A 04/23/2021   Procedure: ESOPHAGOGASTRODUODENOSCOPY (EGD);  Surgeon: Quentin Ore, MD;  Location: [facility];  Service: General;  Laterality: N/A;   FOOT SURGERY  2004   left   GASTRIC BYPASS     INCISION AND DRAINAGE ABSCESS ANAL  07/2008   I&D and debridgement of anorectal abscess   INCISIONAL HERNIA REPAIR  12/2010   Repair of incarcerated ventral incisional hernia with Ethicon mesh patch - performed by Dr. Gerrit Friends.    INGUINAL HERNIA REPAIR     JOINT REPLACEMENT Left 2013   left knee   TOTAL KNEE ARTHROPLASTY  06/16/2011   Left TOTAL KNEE ARTHROPLASTY;  Surgeon: Kennieth Rad;  Location: [facility];  Service: Orthopedics;  Laterality: Left;  left total knee arthroplasty   TUBAL LIGATION     UPPER GI ENDOSCOPY N/A 08/16/2021   Procedure: UPPER GI ENDOSCOPY;  Surgeon: Quentin Ore, MD;  Location: [facility];  Service:  General;  Laterality: N/A;   Family History  Problem Relation Age of Onset   Diabetes Mother    Hypertension Mother    Stroke Mother    Hyperlipidemia Mother    Heart disease Mother    Sudden death Mother    Kidney disease Mother    Depression Mother    Dementia Father    Heart disease Father    Hyperlipidemia Father    Sudden death Father    Depression Father    Hypertension Sister    Diabetes Brother    Hypertension Brother    Rashes / Skin problems Maternal Grandfather    Heart attack Neg Hx    Social  History   Socioeconomic History   Marital status: Married    Spouse name: Gerarda Fraction   Number of children: 3   Years of education: Not on file   Highest education level: 12th grade  Occupational History   Occupation: Stay at home  Tobacco Use   Smoking status: Former    Current packs/day: 0.00    Average packs/day: 0.5 packs/day for 20.0 years (10.0 ttl pk-yrs)    Types: Cigarettes    Start date: 09/10/1978    Quit date: 09/10/1998    Years since quitting: 24.5   Smokeless tobacco: Never   Tobacco comments:    67 yo   Vaping Use   Vaping status: Never Used  Substance and Sexual Activity   Alcohol use: No    Alcohol/week: 0.0 standard drinks of alcohol   Drug use: No   Sexual activity: Yes    Partners: Male  Other Topics Concern   Not on file  Social History Narrative   10/05/2018: Lives with husband, daughter, and 2 grandchildren in [place]. Lives on main level though mostly.   Has three children altogether, all local, supportive   Currently getting ramp installed in house d/t pt difficulty getting upstairs.      Social Determinants of Health   Financial Resource Strain: Low Risk  (03/06/2023)   Overall Financial Resource Strain (CARDIA)    Difficulty of Paying Living Expenses: Not hard at all  Food Insecurity: No Food Insecurity (03/06/2023)   Hunger Vital Sign    Worried About Running Out of Food in the Last Year: Never true    Ran Out of Food in the  Last Year: Never true  Transportation Needs: No Transportation Needs (03/06/2023)   PRAPARE - Administrator, Civil Service (Medical): No    Lack of Transportation (Non-Medical): No  Physical Activity: Unknown (08/09/2022)   Exercise Vital Sign    Days of Exercise per Week: 2 days    Minutes of Exercise per Session: Not on file  Stress: No Stress Concern Present (03/06/2023)   [employer] of Occupational Health - Occupational Stress Questionnaire    Feeling of Stress : Not at all  Social Connections: Socially Integrated (03/06/2023)   Social Connection and Isolation Panel [NHANES]    Frequency of Communication with Friends and Family: More than three times a week    Frequency of Social Gatherings with Friends and Family: More than three times a week    Attends Religious Services: More than 4 times per year    Active Member of [employer] or Organizations: Yes    Attends Engineer, structural: More than 4 times per year    Marital Status: Married    Tobacco Counseling Counseling given: Not Answered Tobacco comments: 67 yo    Clinical Intake:  Pre-visit preparation completed: Yes  Pain : No/denies pain     BMI - recorded: 42.6 Nutritional Status: BMI > 30  Obese Nutritional Risks: None Diabetes: No  How often do you need to have someone help you when you read instructions, pamphlets, or other written materials from your doctor or pharmacy?: 1 - Never  Interpreter Needed?: No  Information entered by :: Theresa Mulligan LPN   Activities of Daily Living    03/06/2023    3:50 PM  In your present state of health, do you have any difficulty performing the following activities:  Hearing? 0  Vision? 0  Difficulty concentrating or making decisions? 0  Walking or climbing stairs?  1  Comment Uses [employer] or bathing? 0  Doing errands, shopping? 1  Comment Family Ship broker and eating ? Y  Comment Family assist   Using the Toilet? Y  Comment Family Assist  In the past six months, have you accidently leaked urine? N  Do you have problems with loss of bowel control? N  Managing your Medications? N  Managing your Finances? N  Comment Husband assist  Housekeeping or managing your Housekeeping? Y  Comment Family Assist    Patient Care Team: [name], [name], MD as PCP - General (Family Medicine) Quintella Reichert, MD as PCP - Cardiology (Cardiology) [place], Milas Kocher, [facility] (Inactive) (Pharmacist)  Indicate any recent Medical Services you may have received from other than [facility] providers in the past year (date may be approximate).     Assessment:   This is a routine wellness examination for D[place].  Hearing/Vision screen Hearing Screening - Comments:: Denies hearing difficulties   Vision Screening - Comments:: Wears rx glasses - up to date with routine eye exams with  Dr Dione Booze  Dietary issues and exercise activities discussed:     Goals Addressed               This Visit's Progress     To get hip surgery (pt-stated)        To get and stay healthy.       Depression Screen    03/06/2023    3:49 PM 08/09/2022   10:59 AM 06/06/2022   12:31 PM 03/02/2022    1:13 PM 03/02/2022    1:12 PM 01/18/2022    3:37 PM 09/13/2021   10:32 AM  PHQ 2/9 Scores  PHQ - 2 Score 0 0 1 0 0 2 0  PHQ- 9 Score  1 3 0 0 4 2    Fall Risk    03/06/2023    3:53 PM 08/09/2022   10:59 AM 06/06/2022   12:31 PM 06/06/2022    9:52 AM 03/02/2022    1:19 PM  Fall Risk   Falls in the past year? 0 1 0 0 0  Number falls in past yr: 0 1 0  0  Injury with Fall? 0 0 0  0  Risk for fall due to : No Fall Risks Impaired mobility Other (Comment)  No Fall Risks  Follow up Falls prevention discussed Falls evaluation completed Falls evaluation completed      MEDICARE RISK AT HOME: Medicare Risk at Home Any stairs in or around the home?: Yes If so, are there any without handrails?: No Home free of loose throw rugs in  walkways, pet beds, electrical cords, etc?: Yes Adequate lighting in your home to reduce risk of falls?: Yes Life alert?: Yes Use of a cane, walker or w/c?: Yes Grab bars in the bathroom?: Yes Shower chair or bench in shower?: No Elevated toilet seat or a handicapped toilet?: Yes  TIMED UP AND GO:  Was the test performed?  No    Cognitive Function:        03/06/2023    3:55 PM 03/02/2022    1:20 PM 02/17/2021    1:16 PM 02/12/2020    9:57 AM  6CIT Screen  What Year? 0 points 0 points 0 points 0 points  What month? 0 points 0 points 0 points 0 points  What time? 0 points 0 points 0 points   Count back from 20 0 points 0 points 0  points 0 points  Months in reverse 0 points 0 points 0 points 0 points  Repeat phrase 0 points 0 points 2 points 6 points  Total Score 0 points 0 points 2 points     Immunizations Immunization History  Administered Date(s) Administered   Fluad Quad(high Dose 65+) 06/06/2022   Influenza Split 03/29/2012   Influenza,inj,Quad PF,6+ Mos 04/09/2013, 09/23/2014, 05/04/2015, 04/04/2016, 03/20/2017, 05/02/2018, 04/27/2020   Moderna Sars-Covid-2 Vaccination 05/07/2020, 06/07/2020   PNEUMOCOCCAL CONJUGATE-20 06/06/2022   Pneumococcal Polysaccharide-23 03/29/2012, 04/04/2016   Tdap 10/14/2010    TDAP status: Due, Education has been provided regarding the importance of this vaccine. Advised may receive this vaccine at local pharmacy or Health Dept. Aware to provide a copy of the vaccination record if obtained from local pharmacy or Health Dept. Verbalized acceptance and understanding.  Flu Vaccine status: Due, Education has been provided regarding the importance of this vaccine. Advised may receive this vaccine at local pharmacy or Health Dept. Aware to provide a copy of the vaccination record if obtained from local pharmacy or Health Dept. Verbalized acceptance and understanding.  Pneumococcal vaccine status: Up to date  Covid-19 vaccine status: Declined,  Education has been provided regarding the importance of this vaccine but patient still declined. Advised may receive this vaccine at local pharmacy or Health Dept.or vaccine clinic. Aware to provide a copy of the vaccination record if obtained from local pharmacy or Health Dept. Verbalized acceptance and understanding.  Qualifies for Shingles Vaccine? Yes   Zostavax completed No   Shingrix Completed?: No.    Education has been provided regarding the importance of this vaccine. Patient has been advised to call insurance company to determine out of pocket expense if they have not yet received this vaccine. Advised may also receive vaccine at local pharmacy or Health Dept. Verbalized acceptance and understanding.  Screening Tests Health Maintenance  Topic Date Due   Zoster Vaccines- Shingrix (1 of 2) Never done   DTaP/Tdap/Td (2 - Td or Tdap) 10/13/2020   COVID-19 Vaccine (3 - 2023-24 season) 03/11/2022   INFLUENZA VACCINE  02/09/2023   PAP SMEAR-Modifier  01/21/2024 (Originally 01/20/2022)   MAMMOGRAM  05/01/2023   Medicare Annual Wellness (AWV)  03/05/2024   Colonoscopy  12/19/2027   Pneumonia Vaccine 9+ Years old  Completed   DEXA SCAN  Completed   Hepatitis C Screening  Completed   HIV Screening  Completed   HPV VACCINES  Aged Out    Health Maintenance  Health Maintenance Due  Topic Date Due   Zoster Vaccines- Shingrix (1 of 2) Never done   DTaP/Tdap/Td (2 - Td or Tdap) 10/13/2020   COVID-19 Vaccine (3 - 2023-24 season) 03/11/2022   INFLUENZA VACCINE  02/09/2023    Colorectal cancer screening: Type of screening: Colonoscopy. Completed 12/18/17. Repeat every 10 years  Mammogram status: Completed 04/30/21. Repeat every year  Bone Density status: Completed 01/17/19. Results reflect: Bone density results: NORMAL. Repeat every   years.  Lung Cancer Screening: (Low Dose CT Chest recommended if Age 70-80 years, 20 pack-year currently smoking OR have quit w/in 15years.) does not  qualify.     Additional Screening:  Hepatitis C Screening: does qualify; Completed 08/18/16  Vision Screening: Recommended annual ophthalmology exams for early detection of glaucoma and other disorders of the eye. Is the patient up to date with their annual eye exam?  Yes  Who is the provider or what is the name of the office in which the patient attends annual eye exams? Dr Dione Booze  If pt is not established with a provider, would they like to be referred to a provider to establish care? No .   Dental Screening: Recommended annual dental exams for proper oral hygiene    Community Resource Referral / Chronic Care Management:  CRR required this visit?  No   CCM required this visit?  No     Plan:     I have personally reviewed and noted the following in the patient's chart:   Medical and social history Use of alcohol, tobacco or illicit drugs  Current medications and supplements including opioid prescriptions. Patient is not currently taking opioid prescriptions. Functional ability and status Nutritional status Physical activity Advanced directives List of other physicians Hospitalizations, surgeries, and ER visits in previous 12 months Vitals Screenings to include cognitive, depression, and falls Referrals and appointments  In addition, I have reviewed and discussed with patient certain preventive protocols, quality metrics, and best practice recommendations. A written personalized care plan for preventive services as well as general preventive health recommendations were provided to patient.     [name], LPN   11/02/9561   After Visit Summary: (MyChart) Due to this being a telephonic visit, the after visit summary with patients personalized plan was offered to patient via MyChart   Nurse Notes: None

## 2023-03-14 ENCOUNTER — Encounter: Payer: Self-pay | Admitting: Family Medicine

## 2023-03-15 ENCOUNTER — Other Ambulatory Visit: Payer: Self-pay | Admitting: Family Medicine

## 2023-03-16 ENCOUNTER — Encounter (HOSPITAL_COMMUNITY): Payer: Self-pay | Admitting: *Deleted

## 2023-03-30 DIAGNOSIS — Z903 Acquired absence of stomach [part of]: Secondary | ICD-10-CM | POA: Diagnosis not present

## 2023-03-30 DIAGNOSIS — Z1321 Encounter for screening for nutritional disorder: Secondary | ICD-10-CM | POA: Diagnosis not present

## 2023-03-30 DIAGNOSIS — R5383 Other fatigue: Secondary | ICD-10-CM | POA: Diagnosis not present

## 2023-03-30 DIAGNOSIS — D539 Nutritional anemia, unspecified: Secondary | ICD-10-CM | POA: Diagnosis not present

## 2023-03-30 DIAGNOSIS — E569 Vitamin deficiency, unspecified: Secondary | ICD-10-CM | POA: Diagnosis not present

## 2023-04-12 ENCOUNTER — Other Ambulatory Visit: Payer: Self-pay | Admitting: Family Medicine

## 2023-04-12 DIAGNOSIS — M5116 Intervertebral disc disorders with radiculopathy, lumbar region: Secondary | ICD-10-CM

## 2023-04-12 DIAGNOSIS — M159 Polyosteoarthritis, unspecified: Secondary | ICD-10-CM

## 2023-05-05 ENCOUNTER — Other Ambulatory Visit: Payer: Self-pay | Admitting: Family Medicine

## 2023-05-05 DIAGNOSIS — M159 Polyosteoarthritis, unspecified: Secondary | ICD-10-CM

## 2023-05-05 DIAGNOSIS — M5116 Intervertebral disc disorders with radiculopathy, lumbar region: Secondary | ICD-10-CM

## 2023-05-22 ENCOUNTER — Emergency Department (HOSPITAL_COMMUNITY): Payer: Medicare Other

## 2023-05-22 ENCOUNTER — Emergency Department (HOSPITAL_COMMUNITY)
Admission: EM | Admit: 2023-05-22 | Discharge: 2023-05-22 | Disposition: A | Discharge status: Home / Self Care | Payer: Medicare Other | Attending: Emergency Medicine | Admitting: Emergency Medicine

## 2023-05-22 ENCOUNTER — Encounter (HOSPITAL_COMMUNITY): Payer: Self-pay

## 2023-05-22 ENCOUNTER — Other Ambulatory Visit: Payer: Self-pay

## 2023-05-22 DIAGNOSIS — R059 Cough, unspecified: Secondary | ICD-10-CM | POA: Insufficient documentation

## 2023-05-22 DIAGNOSIS — R0602 Shortness of breath: Secondary | ICD-10-CM | POA: Insufficient documentation

## 2023-05-22 DIAGNOSIS — I10 Essential (primary) hypertension: Secondary | ICD-10-CM | POA: Insufficient documentation

## 2023-05-22 DIAGNOSIS — Z79899 Other long term (current) drug therapy: Secondary | ICD-10-CM | POA: Diagnosis not present

## 2023-05-22 DIAGNOSIS — J45909 Unspecified asthma, uncomplicated: Secondary | ICD-10-CM | POA: Diagnosis not present

## 2023-05-22 DIAGNOSIS — Z1152 Encounter for screening for COVID-19: Secondary | ICD-10-CM | POA: Insufficient documentation

## 2023-05-22 DIAGNOSIS — R5383 Other fatigue: Secondary | ICD-10-CM | POA: Diagnosis not present

## 2023-05-22 DIAGNOSIS — R0989 Other specified symptoms and signs involving the circulatory and respiratory systems: Secondary | ICD-10-CM | POA: Diagnosis not present

## 2023-05-22 DIAGNOSIS — J111 Influenza due to unidentified influenza virus with other respiratory manifestations: Secondary | ICD-10-CM

## 2023-05-22 DIAGNOSIS — Z91040 Latex allergy status: Secondary | ICD-10-CM | POA: Insufficient documentation

## 2023-05-22 DIAGNOSIS — R0981 Nasal congestion: Secondary | ICD-10-CM | POA: Diagnosis not present

## 2023-05-22 LAB — BASIC METABOLIC PANEL
Anion gap: 10 (ref 5–15)
BUN: 10 mg/dL (ref 8–23)
CO2: 24 mmol/L (ref 22–32)
Calcium: 10.3 mg/dL (ref 8.9–10.3)
Chloride: 105 mmol/L (ref 98–111)
Creatinine, Ser: 0.96 mg/dL (ref 0.44–1.00)
GFR, Estimated: >60 mL/min (ref >60)
Glucose, Bld: 94 mg/dL (ref 70–99)
Potassium: 4.2 mmol/L (ref 3.5–5.1)
Sodium: 139 mmol/L (ref 135–145)

## 2023-05-22 LAB — URINALYSIS, ROUTINE W REFLEX MICROSCOPIC
Bilirubin Urine: NEGATIVE
Glucose, UA: NEGATIVE mg/dL
Hgb urine dipstick: NEGATIVE
Ketones, ur: NEGATIVE mg/dL
Nitrite: NEGATIVE
Protein, ur: NEGATIVE mg/dL
Specific Gravity, Urine: 1.014 (ref 1.005–1.030)
pH: 5 (ref 5.0–8.0)

## 2023-05-22 LAB — RESP PANEL BY RT-PCR (RSV, FLU A&B, COVID)  RVPGX2
Influenza A by PCR: NEGATIVE
Influenza B by PCR: NEGATIVE
Resp Syncytial Virus by PCR: NEGATIVE
SARS Coronavirus 2 by RT PCR: NEGATIVE

## 2023-05-22 LAB — CBG MONITORING, ED: Glucose-Capillary: 79 mg/dL (ref 70–99)

## 2023-05-22 LAB — CBC
HCT: 39.9 % (ref 36.0–46.0)
Hemoglobin: 12.5 g/dL (ref 12.0–15.0)
MCH: 28.6 pg (ref 26.0–34.0)
MCHC: 31.3 g/dL (ref 30.0–36.0)
MCV: 91.3 fL (ref 80.0–100.0)
Platelets: 380 10*3/uL (ref 150–400)
RBC: 4.37 MIL/uL (ref 3.87–5.11)
RDW: 12.6 % (ref 11.5–15.5)
WBC: 5.1 10*3/uL (ref 4.0–10.5)
nRBC: 0 % (ref 0.0–0.2)

## 2023-05-22 MED ORDER — ALBUTEROL SULFATE HFA 108 (90 BASE) MCG/ACT IN AERS
1.0000 | INHALATION_SPRAY | Freq: Once | RESPIRATORY_TRACT | Status: AC
  Administered 2023-05-22: 1 via RESPIRATORY_TRACT
  Filled 2023-05-22: qty 6.7

## 2023-05-22 NOTE — ED Provider Notes (Signed)
[place] EMERGENCY DEPARTMENT AT [facility] Provider Note   CSN: [id number] Arrival date & time: 05/22/23  4098     History  Chief Complaint  Patient presents with   Cough   Weakness    [name] is a 67 y.o. female.  With history of hypertension, hyperlipidemia, asthma, vitamin B12 deficiency, OSA, anemia presenting to the ED for evaluation of cough, congestion, fatigue.  Symptoms began approximately 10 days ago.  They have remained constant during that time.  Her cough is occasionally productive of yellow sputum.  She reports some chest congestion but denies any chest pain.  She feels mildly short of breath at rest.  Does have history of asthma, has not needed to use a rescue inhaler.  She denies any fevers or chills.  She reports rhinorrhea and sore throat as well.  She denies any lower extremity swelling.  She reports decreased appetite and has only been drinking water and eating yogurt lately.   Cough Associated symptoms: shortness of breath   Weakness Associated symptoms: cough and shortness of breath        Home Medications Prior to Admission medications   Medication Sig Start Date End Date Taking? Authorizing Provider  calcium carbonate (OS-CAL - DOSED IN MG OF ELEMENTAL CALCIUM) 1250 (500 Ca) MG tablet Take 1 tablet by mouth 2 (two) times daily with a meal.   Yes [provider]  Cholecalciferol (VITAMIN D) 50 MCG (2000 UT) CAPS Take 2,000 Units by mouth daily.   Yes [provider]  diclofenac Sodium (VOLTAREN) 1 % GEL Apply 2 g topically 4 (four) times daily as needed (pain).   Yes [provider]  DULoxetine (CYMBALTA) 20 MG capsule TAKE 1 CAPSULE BY MOUTH EVERY DAY 05/08/23  Yes [name], [name], MD  lisinopril (ZESTRIL) 40 MG tablet TAKE 1 TABLET BY MOUTH DAILY 01/31/23  Yes [name], [name], MD  Multiple Vitamins-Minerals (BARIATRIC MULTIVITAMINS/IRON PO) Take 1 tablet by mouth daily.   Yes [provider]   phentermine (ADIPEX-P) 37.5 MG tablet Take 37.5 mg by mouth daily with breakfast. 05/05/23  Yes [provider]  rosuvastatin (CRESTOR) 10 MG tablet Take 1 tablet (10 mg total) by mouth daily. 01/09/23  Yes [name], [name], MD  trolamine salicylate (ASPERCREME) 10 % cream Apply 1 application topically as needed for muscle pain.   Yes [provider]  albuterol (VENTOLIN HFA) 108 (90 Base) MCG/ACT inhaler Inhale 1-2 puffs into the lungs every 6 (six) hours as needed for wheezing or shortness of breath. Patient not taking: Reported on 05/22/2023 04/28/20   [name], [name], MD  budesonide-formoterol [facility]) 160-4.5 MCG/ACT inhaler Inhale 2 puffs into the lungs 2 (two) times daily. Patient not taking: Reported on 05/22/2023 07/21/20   [name], [name], MD      Allergies    Ketorolac tromethamine, Atrovent [ipratropium], Latex, and Tape    Review of Systems   Review of Systems  Constitutional:  Positive for fatigue.  Respiratory:  Positive for cough and shortness of breath.   All other systems reviewed and are negative.   Physical Exam Updated Vital Signs BP 135/72   Pulse 78   Temp 98.3 F (36.8 C) (Oral)   Resp (!) 24   Ht 5\' 5"  (1.651 m)   Wt 117.9 kg   LMP 09/09/2009   SpO2 99%   BMI 43.27 kg/m  Physical Exam Vitals and nursing note reviewed.  Constitutional:      General: She  is not in acute distress.    Appearance: She is well-developed. She is not ill-appearing, toxic-appearing or diaphoretic.     Comments: Resting comfortably in bed  HENT:     Head: Normocephalic and atraumatic.     Right Ear: Tympanic membrane and ear canal normal.     Left Ear: Tympanic membrane and ear canal normal.     Mouth/Throat:     Mouth: Mucous membranes are moist.     Pharynx: Oropharynx is clear. No oropharyngeal exudate or posterior oropharyngeal erythema.  Eyes:     Conjunctiva/sclera: Conjunctivae normal.  Cardiovascular:     Rate and Rhythm: Normal rate and  regular rhythm.     Heart sounds: No murmur heard. Pulmonary:     Effort: Pulmonary effort is normal. No respiratory distress.     Breath sounds: Normal breath sounds. No wheezing, rhonchi or rales.  Abdominal:     Palpations: Abdomen is soft.     Tenderness: There is no abdominal tenderness. There is no guarding.  Musculoskeletal:        General: No swelling.     Cervical back: Neck supple.  Lymphadenopathy:     Cervical: Cervical adenopathy present.  Skin:    General: Skin is warm and dry.     Capillary Refill: Capillary refill takes less than 2 seconds.  Neurological:     General: No focal deficit present.     Mental Status: She is alert and oriented to person, place, and time.  Psychiatric:        Mood and Affect: Mood normal.     ED Results / Procedures / Treatments   Labs (all labs ordered are listed, but only abnormal results are displayed) Labs Reviewed  URINALYSIS, ROUTINE W REFLEX MICROSCOPIC - Abnormal; Notable for the following components:      Result Value   Color, Urine AMBER (*)    APPearance CLOUDY (*)    Leukocytes,Ua MODERATE (*)    Bacteria, UA RARE (*)    All other components within normal limits  RESP PANEL BY RT-PCR (RSV, FLU A&B, COVID)  RVPGX2  BASIC METABOLIC PANEL  CBC  CBG MONITORING, ED    EKG EKG Interpretation Date/Time:  Monday May 22 2023 10:26:38 EST Ventricular Rate:  72 PR Interval:  156 QRS Duration:  85 QT Interval:  423 QTC Calculation: 463 R Axis:   16  Text Interpretation: Sinus rhythm Confirmed by Vanetta Mulders [phone]) on 05/22/2023 10:40:55 AM  Radiology DG Chest 2 View  Result Date: 05/22/2023 CLINICAL DATA:  Cough and chest congestion for 10 days. EXAM: CHEST - 2 VIEW COMPARISON:  09/05/2021 FINDINGS: The heart size and mediastinal contours are within normal limits. Both lungs are clear. The visualized skeletal structures are unremarkable. IMPRESSION: No active cardiopulmonary disease. Electronically Signed    By: Danae Orleans M.D.   On: 05/22/2023 14:00    Procedures Procedures    Medications Ordered in ED Medications  albuterol (VENTOLIN HFA) 108 (90 Base) MCG/ACT inhaler 1 puff (1 puff Inhalation Given 05/22/23 1412)    ED Course/ Medical Decision Making/ A&P                                 Medical Decision Making Amount and/or Complexity of Data Reviewed Labs: ordered. Radiology: ordered.  Risk Prescription drug management.  This patient presents to the ED for concern of cough, congestion, rhinorrhea, fatigue, this involves an extensive number  of treatment options, and is a complaint that carries with it a high risk of complications and morbidity.  The differential diagnosis includes flu, COVID, RSV, other viral URI, pneumonia, electrolyte abnormality  My initial workup includes labs, imaging  Additional history obtained from: Nursing notes from this visit.  I ordered, reviewed and interpreted labs which include: BMP, CBC, urinalysis, respiratory panel.  Labs unremarkable.  Urine with leukocytes, 11-20 white blood cells, rare bacteria.  No complaints of urinary symptoms so will defer treatment  I ordered imaging studies including chest x-ray I independently visualized and interpreted imaging which showed no acute cardiopulmonary abnormalities I agree with the radiologist interpretation  Cardiac Monitoring:  The patient was maintained on a cardiac monitor.  I personally viewed and interpreted the cardiac monitored which showed an underlying rhythm of: NSR  Afebrile, hemodynamically stable.  67 year old female presenting for evaluation of cough, congestion, rhinorrhea, fatigue, loss of appetite.  Symptoms have been present for the past 10 days or so.  She appears very well on physical exam.  No tachypnea, tachycardia, hypoxia.  She is nontoxic-appearing.  No adventitious breath sounds.  Lab workup reassuring.  No leukocytosis or anemia.  Respiratory panel negative.  No electrolyte  abnormalities.  Chest x-ray negative.  Overall low suspicion for pneumonia.  Suspect a viral illness.  She was encouraged to eat and drink normal diet.  She was given a refill of her albuterol inhaler in the emergency department for symptomatic relief.  She was encouraged to use over-the-counter medications for her symptoms.  She was encouraged to follow-up with her primary care provider in 1 week if not improving.  She was given return precautions.  Stable at discharge.  At this time there does not appear to be any evidence of an acute emergency medical condition and the patient appears stable for discharge with appropriate outpatient follow up. Diagnosis was discussed with patient who verbalizes understanding of care plan and is agreeable to discharge. I have discussed return precautions with patient who verbalizes understanding. Patient encouraged to follow-up with their PCP within 1 week. All questions answered.  Patient's case discussed with Dr. Deretha Emory who agrees with plan to discharge with follow-up.   Note: Portions of this report may have been transcribed using voice recognition software. Every effort was made to ensure accuracy; however, inadvertent computerized transcription errors may still be present.        Final Clinical Impression(s) / ED Diagnoses Final diagnoses:  Influenza-like illness    Rx / DC Orders ED Discharge Orders     None         Michelle Piper, PA-C 05/22/23 1421    Vanetta Mulders, MD 05/27/23 [phone]

## 2023-05-22 NOTE — ED Triage Notes (Signed)
Pt reports that she has had a cough and congestion starting 11/1.  Pt reports chest tightness. Pt reports weakness.

## 2023-05-22 NOTE — Discharge Instructions (Addendum)
You have been seen today for your complaint of cough, congestion, runny nose. Your lab work is reassuring. Your imaging is reassuring. Your discharge medications include albuterol inhaler.  Use this as needed if he feels short of breath.  You should also use Claritin and Flonase for your symptoms. Follow up with: Your primary care provider in 1 week for reevaluation Please seek immediate medical care if you develop any of the following symptoms: You feel pain or pressure in your chest. You have trouble breathing. You faint or feel like you will faint. You keep vomiting and it gets worse. You feel confused. At this time there does not appear to be the presence of an emergent medical condition, however there is always the potential for conditions to change. Please read and follow the below instructions.  Do not take your medicine if  develop an itchy rash, swelling in your mouth or lips, or difficulty breathing; call 911 and seek immediate emergency medical attention if this occurs.  You may review your lab tests and imaging results in their entirety on your MyChart account.  Please discuss all results of fully with your primary care provider and other specialist at your follow-up visit.  Note: Portions of this text may have been transcribed using voice recognition software. Every effort was made to ensure accuracy; however, inadvertent computerized transcription errors may still be present.

## 2023-05-23 ENCOUNTER — Telehealth: Payer: Self-pay

## 2023-05-23 NOTE — Transitions of Care (Post Inpatient/ED Visit) (Signed)
05/23/2023  Name: [name] MRN: [id number] DOB: 04-Jan-1956  Today's TOC FU Call Status: Today's TOC FU Call Status:: Successful TOC FU Call Completed TOC FU Call Complete Date: 05/23/23 Patient's Name and Date of Birth confirmed.  Transition Care Management Follow-up Telephone Call Date of Discharge: 05/22/23 Discharge Facility: [facility]) Type of Discharge: Emergency Department Reason for ED Visit: Other: (influenza) How have you been since you were released from the hospital?: Same Any questions or concerns?: No  Items Reviewed: Did you receive and understand the discharge instructions provided?: Yes Medications obtained,verified, and reconciled?: Yes (Medications Reviewed) Any new allergies since your discharge?: No Dietary orders reviewed?: Yes Do you have support at home?: No  Medications Reviewed Today: Medications Reviewed Today     Reviewed by Karena Addison, LPN (Licensed Practical Nurse) on 05/23/23 at 1057  Med List Status: <None>   Medication Order Taking? Sig Documenting Provider Last Dose Status Informant  albuterol (VENTOLIN HFA) 108 (90 Base) MCG/ACT inhaler [id number] No Inhale 1-2 puffs into the lungs every 6 (six) hours as needed for wheezing or shortness of breath.  Patient not taking: Reported on 05/22/2023   [name], [name], MD Not Taking Active Self  budesonide-formoterol [facility]) 160-4.5 MCG/ACT inhaler [id number] No Inhale 2 puffs into the lungs 2 (two) times daily.  Patient not taking: Reported on 05/22/2023   [name], [name], MD Not Taking Active Self  calcium carbonate (OS-CAL - DOSED IN MG OF ELEMENTAL CALCIUM) 1250 (500 Ca) MG tablet [id number] No Take 1 tablet by mouth 2 (two) times daily with a meal. [provider] 05/21/2023 Active Self  Cholecalciferol (VITAMIN D) 50 MCG (2000 UT) CAPS [id number] No Take 2,000 Units by mouth daily. [provider] 05/21/2023 Active Self  diclofenac Sodium (VOLTAREN) 1 % GEL  [id number] No Apply 2 g topically 4 (four) times daily as needed (pain). [provider] unknown Active Self  DULoxetine (CYMBALTA) 20 MG capsule [id number] No TAKE 1 CAPSULE BY MOUTH EVERY DAY [name], [name], MD 05/21/2023 Active Self  lisinopril (ZESTRIL) 40 MG tablet [id number] No TAKE 1 TABLET BY MOUTH DAILY [name], [name], MD 05/21/2023 Active Self  Multiple Vitamins-Minerals (BARIATRIC MULTIVITAMINS/IRON PO) [id number] No Take 1 tablet by mouth daily. [provider] 05/21/2023 Active Self  phentermine (ADIPEX-P) 37.5 MG tablet [id number] No Take 37.5 mg by mouth daily with breakfast. [provider] 05/21/2023 Active Self  rosuvastatin (CRESTOR) 10 MG tablet [id number] No Take 1 tablet (10 mg total) by mouth daily. [name], [name], MD 05/21/2023 Active Self  trolamine salicylate (ASPERCREME) 10 % cream [id number] No Apply 1 application topically as needed for muscle pain. [provider] unknown Active Self            Home Care and Equipment/Supplies: Were Home Health Services Ordered?: NA Any new equipment or medical supplies ordered?: NA  Functional Questionnaire: Do you need assistance with bathing/showering or dressing?: No Do you need assistance with meal preparation?: No Do you need assistance with eating?: No Do you have difficulty maintaining continence: No Do you need assistance with getting out of bed/getting out of a chair/moving?: No Do you have difficulty managing or taking your medications?: No  Follow up appointments reviewed: PCP Follow-up appointment confirmed?: Yes Date of PCP follow-up appointment?: 05/29/23 Follow-up Provider[facility] [name] [facility] Follow-up appointment confirmed?: NA Do you need transportation to your follow-up appointment?: No Do you understand care options if your condition(s) worsen?: Yes-patient verbalized understanding    SIGNATURE Alexandr Oehler  Deloria Lair, LPN [facility] Nurse Health Advisor Direct Dial  [phone]

## 2023-05-29 ENCOUNTER — Inpatient Hospital Stay: Payer: Medicare Other | Admitting: Family Medicine

## 2023-06-12 ENCOUNTER — Ambulatory Visit (INDEPENDENT_AMBULATORY_CARE_PROVIDER_SITE_OTHER): Payer: Medicare Other | Admitting: Family Medicine

## 2023-06-12 VITALS — BP 132/80 | HR 67 | Resp 16 | Ht 65.0 in

## 2023-06-12 DIAGNOSIS — I10 Essential (primary) hypertension: Secondary | ICD-10-CM | POA: Diagnosis not present

## 2023-06-12 DIAGNOSIS — M159 Polyosteoarthritis, unspecified: Secondary | ICD-10-CM

## 2023-06-12 DIAGNOSIS — J4520 Mild intermittent asthma, uncomplicated: Secondary | ICD-10-CM

## 2023-06-12 DIAGNOSIS — M5116 Intervertebral disc disorders with radiculopathy, lumbar region: Secondary | ICD-10-CM

## 2023-06-12 DIAGNOSIS — J45901 Unspecified asthma with (acute) exacerbation: Secondary | ICD-10-CM

## 2023-06-12 DIAGNOSIS — Z23 Encounter for immunization: Secondary | ICD-10-CM | POA: Diagnosis not present

## 2023-06-12 DIAGNOSIS — R252 Cramp and spasm: Secondary | ICD-10-CM

## 2023-06-12 MED ORDER — DULOXETINE HCL 20 MG PO CPEP: 40.0000 mg | ORAL_CAPSULE | Freq: Every day | ORAL | Status: DC

## 2023-06-12 MED ORDER — BUDESONIDE-FORMOTEROL FUMARATE 160-4.5 MCG/ACT IN AERO: 2.0000 | INHALATION_SPRAY | Freq: Two times a day (BID) | RESPIRATORY_TRACT | 4 refills | Status: AC

## 2023-06-12 MED ORDER — METHOCARBAMOL 500 MG PO TABS: 500.0000 mg | ORAL_TABLET | Freq: Every evening | ORAL | 2 refills | Status: DC | PRN

## 2023-06-12 NOTE — Progress Notes (Signed)
HPI: [name] is a 67 y.o. female with a PMHx significant for HTN, asthma, lung nodule, OSA, radiculopathy, OA, B12 deficiency, HLD, prediabetes, and depression, among others, who is here today for chronic disease management.  Last seen on 01/09/2023  ED visit:  Patient was seen in the ED on 11/11 for wheezing and flu-like symptoms.  She says she is still having productive cough, nasal congestion, and chills since then but in general symptoms have improved. Negative for fever, wheezing,hemoptysis, or heartburn. She is currently using her albuterol inhaler, but has not been using her Symbicort inhaler for awhile.  CXR was done and negative, lungs were clear.  Since her last visit she has followed with [facility] and currently she is taking phentermine 37.5 mg daily.  Hypertension: Currently on lisinopril 40 mg daily.  BP readings at home: She has been checking her BP at home, and says her readings have normally been 120s/60s-70s. She says it is rarely gets above 130/80.  Side effects: none Negative for unusual or severe headache, visual changes, exertional chest pain, dyspnea,  focal weakness, or edema.  Lab Results  Component Value Date   CREATININE 0.96 05/22/2023   BUN 10 05/22/2023   NA 139 05/22/2023   K 4.2 05/22/2023   CL 105 05/22/2023   CO2 24 05/22/2023   Chronic pain: Lower back pain with right-sided radiculopathy and generalized OA (mainly knees and hips). Currently on duloxetine 20 mg daily, she is not sure if medication has helped. She has tolerated medication well. Here today she is in a wheel chair, at home she uses a walker.  Muscle cramps/spasms:  She complains of some recent cramps and muscle spasms posterior aspect of thighs. She has taken methocarbamol in the past.  She denies numbness or tingling.   Review of Systems  Constitutional:  Negative for chills and fever.  HENT:  Positive for postnasal drip. Negative for sore throat.    Gastrointestinal:  Negative for abdominal pain, nausea and vomiting.  Genitourinary:  Negative for decreased urine volume, dysuria and hematuria.  Musculoskeletal:  Positive for arthralgias, back pain, gait problem and myalgias.  Skin:  Negative for rash.  Neurological:  Negative for syncope and facial asymmetry.  See other pertinent positives and negatives in HPI.  Current Outpatient Medications on File Prior to Visit  Medication Sig Dispense Refill   albuterol (VENTOLIN HFA) 108 (90 Base) MCG/ACT inhaler Inhale 1-2 puffs into the lungs every 6 (six) hours as needed for wheezing or shortness of breath. 3 each 0   calcium carbonate (OS-CAL - DOSED IN MG OF ELEMENTAL CALCIUM) 1250 (500 Ca) MG tablet Take 1 tablet by mouth 2 (two) times daily with a meal.     Cholecalciferol (VITAMIN D) 50 MCG (2000 UT) CAPS Take 2,000 Units by mouth daily.     diclofenac Sodium (VOLTAREN) 1 % GEL Apply 2 g topically 4 (four) times daily as needed (pain).     lisinopril (ZESTRIL) 40 MG tablet TAKE 1 TABLET BY MOUTH DAILY 100 tablet 2   Multiple Vitamins-Minerals (BARIATRIC MULTIVITAMINS/IRON PO) Take 1 tablet by mouth daily.     phentermine (ADIPEX-P) 37.5 MG tablet Take 37.5 mg by mouth daily with breakfast.     rosuvastatin (CRESTOR) 10 MG tablet Take 1 tablet (10 mg total) by mouth daily. 90 tablet 3   trolamine salicylate (ASPERCREME) 10 % cream Apply 1 application topically as needed for muscle pain.     No current facility-administered medications on  file prior to visit.    Past Medical History:  Diagnosis Date   Abnormal EKG 06/2003   History of inverted T waves V1-V3. Normal 2D echo (07/23/2003): LVEF 65%.   Anemia    BL Hgb 11-12. Ferritin 14 - low normal (08/2007). Colonoscopy 2009 - external hemorrhoids (excellent prep). Last anemia panel (12/2010) - Iron  24, TIBC 269,  B12  316, Folate 11.9, Ferritin 73.   Asthma    B12 deficiency    Back pain    Chest pain    Degenerative joint disease     BL knees (L>R), lumbar spine. Followed by Sports Medicine, Dr. Jennette Kettle.   Dyspnea    Gastric bypass status for obesity    History of multiple pulmonary nodules    Incidental finding: CT Abd/ Pelvis (04/2010) - Several small lower lobe lung nodules, including one pure ground-glass pulmonary nodule measuring 8 mm in the left lower lobe.  Recommend follow-up chest CT (IV contrast preferred) in 6 months to document stability. //  CT Abd/ Pelvis (07/2010) -  3 mm RLL and  8 mm LLL nodule stable.  Other nodules unchanged, likely benign.   Hyperlipidemia    Hypertension    Incarcerated ventral hernia 04/2010   Noted on CT Abd/ pelvis (04/2010). Patient now s/p ventral hernia repair by Dr. 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 2D echo (2005) - EF 65%.   Obesity    OSA (obstructive sleep apnea)    Palpitations    Positive D dimer    Pre-diabetes    Right knee pain    Allergies  Allergen Reactions   Ketorolac Tromethamine Shortness Of Breath and Palpitations   Atrovent [Ipratropium] Hives and Rash   Latex Rash and Other (See Comments)    NO POWDERED GLOVES!!!!   Tape Dermatitis    Irritates skin     Social History   Socioeconomic History   Marital status: Married    Spouse name: Tax adviser   Number of children: 3   Years of education: Not on file   Highest education level: Some college, no degree  Occupational History   Occupation: Stay at home  Tobacco Use   Smoking status: Former    Current packs/day: 0.00    Average packs/day: 0.5 packs/day for 20.0 years (10.0 ttl pk-yrs)    Types: Cigarettes    Start date: 09/10/1978    Quit date: 09/10/1998    Years since quitting: 24.7   Smokeless tobacco: Never   Tobacco comments:    67 yo   Vaping Use   Vaping status: Never Used  Substance and Sexual Activity   Alcohol  use: No    Alcohol/week: 0.0 standard drinks of alcohol   Drug use: No   Sexual activity: Yes    Partners: Male  Other Topics Concern   Not on file  Social History Narrative   10/05/2018: Lives with husband, daughter, and 2 grandchildren in [place] house. Lives on main level though mostly.   Has three children altogether, all local, supportive   Currently getting ramp installed in house d/t pt difficulty getting upstairs.      Social Determinants of Health   Financial Resource Strain: Low Risk  (06/12/2023)   Overall Financial Resource Strain (CARDIA)  Difficulty of Paying Living Expenses: Not hard at all  Food Insecurity: No Food Insecurity (06/12/2023)   Hunger Vital Sign    Worried About Running Out of Food in the Last Year: Never true    Ran Out of Food in the Last Year: Never true  Transportation Needs: No Transportation Needs (06/12/2023)   PRAPARE - Administrator, Civil Service (Medical): No    Lack of Transportation (Non-Medical): No  Physical Activity: Insufficiently Active (06/12/2023)   Exercise Vital Sign    Days of Exercise per Week: 1 day    Minutes of Exercise per Session: 20 min  Stress: No Stress Concern Present (06/12/2023)   [employer] of Occupational Health - Occupational Stress Questionnaire    Feeling of Stress : Not at all  Social Connections: Socially Integrated (06/12/2023)   Social Connection and Isolation Panel [NHANES]    Frequency of Communication with Friends and Family: More than three times a week    Frequency of Social Gatherings with Friends and Family: Once a week    Attends Religious Services: 1 to 4 times per year    Active Member of [employer] or Organizations: Yes    Attends Banker Meetings: 1 to 4 times per year    Marital Status: Married    Vitals:   06/12/23 1038  BP: 132/80  Pulse: 67  Resp: 16  SpO2: 99%   Wt Readings from Last 3 Encounters:  05/22/23 260 lb (117.9 kg)  03/06/23 256 lb (116.1 kg)   08/09/22 256 lb 9.6 oz (116.4 kg)   Body mass index is 43.27 kg/m.  Physical Exam Vitals and nursing note reviewed.  Constitutional:      General: She is not in acute distress.    Appearance: She is well-developed.  HENT:     Head: Normocephalic and atraumatic.     Mouth/Throat:     Mouth: Mucous membranes are moist.     Pharynx: Oropharynx is clear. Uvula midline.  Eyes:     Conjunctiva/sclera: Conjunctivae normal.  Cardiovascular:     Rate and Rhythm: Normal rate and regular rhythm.     Pulses:          Dorsalis pedis pulses are 2+ on the right side and 2+ on the left side.     Heart sounds: No murmur heard. Pulmonary:     Effort: Pulmonary effort is normal. No respiratory distress.     Breath sounds: Normal breath sounds.  Abdominal:     Palpations: Abdomen is soft. There is no mass.     Tenderness: There is no abdominal tenderness.  Musculoskeletal:     Right lower leg: No edema.     Left lower leg: No edema.  Skin:    General: Skin is warm.     Findings: No erythema or rash.  Neurological:     General: No focal deficit present.     Mental Status: She is alert and oriented to person, place, and time.     Comments: In a wheel chair.  Psychiatric:        Mood and Affect: Mood and affect normal.   ASSESSMENT AND PLAN:  Ms. Nwankwo was seen today for chronic disease management.   Orders Placed This Encounter  Procedures   Flu Vaccine Trivalent High Dose (Fluad)   Asthma in adult, mild intermittent, uncomplicated Symptoms exacerbated by recent URI, cough has improved. Recommend resuming Symbicort 160-4.5 mcg bid for a couple months then she can continue  as needed.  -     Budesonide-Formoterol Fumarate; Inhale 2 puffs into the lungs 2 (two) times daily.  Dispense: 3 each; Refill: 4  Generalized osteoarthritis of multiple sites (left hip,knee,and lower back) She has tolerated Duloxetine well, recommend increasing dose from 20 mg to 40 mg daily and to let me know  in 4 weeks if she is tolerating well, in which case we can increase to 60 mg daily.  -     DULoxetine HCl; Take 2 capsules (40 mg total) by mouth daily.  Radiculopathy due to lumbar intervertebral disc disorder Will start titrating Duloxetine from 20 mg to 40 mg and if well tolerated it can be increased to 60 mg in 4 weeks. Follows with ortho.  -     DULoxetine HCl; Take 2 capsules (40 mg total) by mouth daily.  Essential hypertension Reporting lower readings at home, 120's/70's. Continue Lisinopril 40 mg daily and low salt diet. Continue monitoring BP at home.  Leg cramps This seems to be a chronic problem. In the past she has been on Flexeril and Methocarbamol. Methocarbamol has helped in the past, some side effects discussed.  -     Methocarbamol; Take 1 tablet (500 mg total) by mouth at bedtime as needed for muscle spasms.  Dispense: 30 tablet; Refill: 2  Need for influenza vaccination -     Flu Vaccine Trivalent High Dose (Fluad)  Return in about 6 months (around 12/11/2023).  I, Rolla Etienne Wierda, acting as a scribe for Shakeitha Umbaugh [name], MD., have documented all relevant documentation on the behalf of Alina Gilkey [name], MD, as directed by  Shekina Cordell [name], MD while in the presence of Nainika Newlun [name], MD.   I, Jernie Schutt [name], MD, have reviewed all documentation for this visit. The documentation on 06/15/23 for the exam, diagnosis, procedures, and orders are all accurate and complete.  Almedia Cordell G. [name], MD  [facility]. [place] office.

## 2023-06-12 NOTE — Patient Instructions (Addendum)
A few things to remember from today's visit:  Essential hypertension  Asthma in adult, mild intermittent, uncomplicated  Generalized osteoarthritis of multiple sites (left hip,knee,and lower back) - Plan: DULoxetine (CYMBALTA) 20 MG capsule  Persistent asthma with acute exacerbation, unspecified asthma severity - Plan: budesonide-formoterol (SYMBICORT) 160-4.5 MCG/ACT inhaler  Leg cramps - Plan: methocarbamol (ROBAXIN) 500 MG tablet  Radiculopathy due to lumbar intervertebral disc disorder - Plan: DULoxetine (CYMBALTA) 20 MG capsule  Start Symbicort 2 times daily, rinse mouth after use. Increase Duloxetine from 20 mg to 40 mg and let me know in 4 weeks if we can go up to 60 mg. Methocarbamol at bedtime as needed for thigh cramps.  If you need refills for medications you take chronically, please call your pharmacy. Do not use My Chart to request refills or for acute issues that need immediate attention. If you send a my chart message, it may take a few days to be addressed, specially if I am not in the office.  Please be sure medication list is accurate. If a new problem present, please set up appointment sooner than planned today.

## 2023-06-28 DIAGNOSIS — Z903 Acquired absence of stomach [part of]: Secondary | ICD-10-CM | POA: Diagnosis not present

## 2023-08-10 ENCOUNTER — Emergency Department (HOSPITAL_COMMUNITY)
Admission: EM | Admit: 2023-08-10 | Discharge: 2023-08-11 | Disposition: A | Discharge status: Home / Self Care | Payer: Medicare Other | Attending: Emergency Medicine | Admitting: Emergency Medicine

## 2023-08-10 DIAGNOSIS — R6889 Other general symptoms and signs: Secondary | ICD-10-CM

## 2023-08-10 DIAGNOSIS — R197 Diarrhea, unspecified: Secondary | ICD-10-CM | POA: Diagnosis not present

## 2023-08-10 DIAGNOSIS — R109 Unspecified abdominal pain: Secondary | ICD-10-CM | POA: Diagnosis not present

## 2023-08-10 DIAGNOSIS — R112 Nausea with vomiting, unspecified: Secondary | ICD-10-CM | POA: Diagnosis not present

## 2023-08-10 DIAGNOSIS — Z91040 Latex allergy status: Secondary | ICD-10-CM | POA: Insufficient documentation

## 2023-08-10 DIAGNOSIS — R519 Headache, unspecified: Secondary | ICD-10-CM | POA: Insufficient documentation

## 2023-08-10 DIAGNOSIS — Z20822 Contact with and (suspected) exposure to covid-19: Secondary | ICD-10-CM | POA: Insufficient documentation

## 2023-08-10 DIAGNOSIS — R531 Weakness: Secondary | ICD-10-CM | POA: Diagnosis not present

## 2023-08-10 DIAGNOSIS — R1084 Generalized abdominal pain: Secondary | ICD-10-CM | POA: Diagnosis not present

## 2023-08-10 DIAGNOSIS — G4489 Other headache syndrome: Secondary | ICD-10-CM | POA: Diagnosis not present

## 2023-08-10 DIAGNOSIS — R404 Transient alteration of awareness: Secondary | ICD-10-CM | POA: Diagnosis not present

## 2023-08-10 DIAGNOSIS — Z743 Need for continuous supervision: Secondary | ICD-10-CM | POA: Diagnosis not present

## 2023-08-10 LAB — CBC
HCT: 41.8 % (ref 36.0–46.0)
Hemoglobin: 12.5 g/dL (ref 12.0–15.0)
MCH: 29.3 pg (ref 26.0–34.0)
MCHC: 29.9 g/dL — ABNORMAL LOW (ref 30.0–36.0)
MCV: 98.1 fL (ref 80.0–100.0)
Platelets: 283 10*3/uL (ref 150–400)
RBC: 4.26 MIL/uL (ref 3.87–5.11)
RDW: 14.4 % (ref 11.5–15.5)
WBC: 8.5 10*3/uL (ref 4.0–10.5)
nRBC: 0 % (ref 0.0–0.2)

## 2023-08-10 LAB — COMPREHENSIVE METABOLIC PANEL
ALT: 11 U/L (ref 0–44)
AST: 18 U/L (ref 15–41)
Albumin: 3.8 g/dL (ref 3.5–5.0)
Alkaline Phosphatase: 85 U/L (ref 38–126)
Anion gap: 11 (ref 5–15)
BUN: 21 mg/dL (ref 8–23)
CO2: 19 mmol/L — ABNORMAL LOW (ref 22–32)
Calcium: 9.4 mg/dL (ref 8.9–10.3)
Chloride: 109 mmol/L (ref 98–111)
Creatinine, Ser: 0.7 mg/dL (ref 0.44–1.00)
GFR, Estimated: >60 mL/min (ref >60)
Glucose, Bld: 108 mg/dL — ABNORMAL HIGH (ref 70–99)
Potassium: 3.5 mmol/L (ref 3.5–5.1)
Sodium: 139 mmol/L (ref 135–145)
Total Bilirubin: 0.4 mg/dL (ref 0.0–1.2)
Total Protein: 7.1 g/dL (ref 6.5–8.1)

## 2023-08-10 LAB — RESP PANEL BY RT-PCR (RSV, FLU A&B, COVID)  RVPGX2
Influenza A by PCR: NEGATIVE
Influenza B by PCR: NEGATIVE
Resp Syncytial Virus by PCR: NEGATIVE
SARS Coronavirus 2 by RT PCR: NEGATIVE

## 2023-08-10 LAB — LIPASE, BLOOD: Lipase: 28 U/L (ref 11–51)

## 2023-08-10 MED ORDER — ONDANSETRON 4 MG PO TBDP
4.0000 mg | ORAL_TABLET | Freq: Once | ORAL | Status: AC | PRN
  Administered 2023-08-10: 4 mg via ORAL
  Filled 2023-08-10: qty 1

## 2023-08-10 NOTE — ED Provider Triage Note (Signed)
Emergency Medicine Provider Triage Evaluation Note  [name] , a 68 y.o. female  was evaluated in triage.  Pt complains of nausea and vomiting, chills, abdominal pain, headache since about 5 PM this evening.  She has been around some sick contacts.  Also reports some intermittent shortness of breath. Denies chest pain  Review of Systems  Positive: As above Negative: As above  Physical Exam  BP (!) 153/82 (BP Location: Left Arm)   Pulse 82   Temp 98.4 F (36.9 C) (Oral)   Resp 16   LMP 09/09/2009   SpO2 100%  Gen:   Awake, no distress   Resp:  Normal effort  MSK:   Moves extremities without difficulty    Medical Decision Making  Medically screening exam initiated at 10:17 PM.  Appropriate orders placed.  [name] was informed that the remainder of the evaluation will be completed by another provider, this initial triage assessment does not replace that evaluation, and the importance of remaining in the ED until their evaluation is complete.     Arabella Merles, PA-C 08/10/23 2218

## 2023-08-10 NOTE — ED Triage Notes (Signed)
Patient arrived with complaints of NV, chills, abd pain and headache since 5pm. Known sick contacts.

## 2023-08-11 DIAGNOSIS — R112 Nausea with vomiting, unspecified: Secondary | ICD-10-CM | POA: Diagnosis not present

## 2023-08-11 LAB — URINALYSIS, ROUTINE W REFLEX MICROSCOPIC
Bilirubin Urine: NEGATIVE
Glucose, UA: NEGATIVE mg/dL
Hgb urine dipstick: NEGATIVE
Ketones, ur: NEGATIVE mg/dL
Leukocytes,Ua: NEGATIVE
Nitrite: NEGATIVE
Protein, ur: NEGATIVE mg/dL
Specific Gravity, Urine: 1.025 (ref 1.005–1.030)
pH: 5 (ref 5.0–8.0)

## 2023-08-11 MED ORDER — ONDANSETRON 4 MG PO TBDP: ORAL_TABLET | ORAL | 0 refills | Status: AC

## 2023-08-11 MED ORDER — MORPHINE SULFATE (PF) 4 MG/ML IV SOLN
4.0000 mg | Freq: Once | INTRAVENOUS | Status: AC
  Administered 2023-08-11: 4 mg via INTRAVENOUS
  Filled 2023-08-11: qty 1

## 2023-08-11 MED ORDER — METOCLOPRAMIDE HCL 5 MG/ML IJ SOLN
10.0000 mg | Freq: Once | INTRAMUSCULAR | Status: AC
  Administered 2023-08-11: 10 mg via INTRAVENOUS
  Filled 2023-08-11: qty 2

## 2023-08-11 MED ORDER — SODIUM CHLORIDE 0.9 % IV BOLUS
1000.0000 mL | Freq: Once | INTRAVENOUS | Status: AC
  Administered 2023-08-11: 1000 mL via INTRAVENOUS

## 2023-08-11 MED ORDER — ONDANSETRON HCL 4 MG/2ML IJ SOLN
4.0000 mg | Freq: Once | INTRAMUSCULAR | Status: AC
  Administered 2023-08-11: 4 mg via INTRAVENOUS
  Filled 2023-08-11: qty 2

## 2023-08-11 MED ORDER — ACETAMINOPHEN 500 MG PO TABS
1000.0000 mg | ORAL_TABLET | Freq: Once | ORAL | Status: AC
  Administered 2023-08-11: 1000 mg via ORAL
  Filled 2023-08-11: qty 2

## 2023-08-11 NOTE — ED Provider Notes (Signed)
[name] EMERGENCY DEPARTMENT AT [facility] Provider Note   CSN: [id number] Arrival date & time: 08/10/23  2201     History  Chief Complaint  Patient presents with   Abdominal Pain   Chills    [name] is a 68 y.o. female.  Presents to the emergency department for evaluation of headache, body aches, generalized weakness and malaise with nausea, vomiting, diarrhea and abdominal discomfort.       Home Medications Prior to Admission medications   Medication Sig Start Date End Date Taking? Authorizing Provider  ondansetron (ZOFRAN-ODT) 4 MG disintegrating tablet 4mg  ODT q4 hours prn nausea/vomit 08/11/23  Yes Delron Comer, Canary Brim, MD  albuterol (VENTOLIN HFA) 108 (90 Base) MCG/ACT inhaler Inhale 1-2 puffs into the lungs every 6 (six) hours as needed for wheezing or shortness of breath. 04/28/20   [name], [name], MD  budesonide-formoterol [facility]) 160-4.5 MCG/ACT inhaler Inhale 2 puffs into the lungs 2 (two) times daily. 06/12/23   [name], [name], MD  calcium carbonate (OS-CAL - DOSED IN MG OF ELEMENTAL CALCIUM) 1250 (500 Ca) MG tablet Take 1 tablet by mouth 2 (two) times daily with a meal.    [provider]  Cholecalciferol (VITAMIN D) 50 MCG (2000 UT) CAPS Take 2,000 Units by mouth daily.    [provider]  diclofenac Sodium (VOLTAREN) 1 % GEL Apply 2 g topically 4 (four) times daily as needed (pain).    [provider]  DULoxetine (CYMBALTA) 20 MG capsule Take 2 capsules (40 mg total) by mouth daily. 06/12/23   [name], [name], MD  lisinopril (ZESTRIL) 40 MG tablet TAKE 1 TABLET BY MOUTH DAILY 01/31/23   [name], [name], MD  methocarbamol (ROBAXIN) 500 MG tablet Take 1 tablet (500 mg total) by mouth at bedtime as needed for muscle spasms. 06/12/23   [name], [name], MD  Multiple Vitamins-Minerals (BARIATRIC MULTIVITAMINS/IRON PO) Take 1 tablet by mouth daily.    [provider]  phentermine (ADIPEX-P) 37.5 MG tablet Take  37.5 mg by mouth daily with breakfast. 05/05/23   [provider]  rosuvastatin (CRESTOR) 10 MG tablet Take 1 tablet (10 mg total) by mouth daily. 01/09/23   [name], [name], MD  trolamine salicylate (ASPERCREME) 10 % cream Apply 1 application topically as needed for muscle pain.    [provider]      Allergies    Ketorolac tromethamine, Atrovent [ipratropium], Latex, and Tape    Review of Systems   Review of Systems  Physical Exam Updated Vital Signs BP (!) 169/73   Pulse 96   Temp 98.4 F (36.9 C) (Oral)   Resp 18   LMP 09/09/2009   SpO2 100%  Physical Exam Vitals and nursing note reviewed.  Constitutional:      General: She is not in acute distress.    Appearance: She is well-developed.  HENT:     Head: Normocephalic and atraumatic.     Mouth/Throat:     Mouth: Mucous membranes are moist.  Eyes:     General: Vision grossly intact. Gaze aligned appropriately.     Extraocular Movements: Extraocular movements intact.     Conjunctiva/sclera: Conjunctivae normal.  Cardiovascular:     Rate and Rhythm: Normal rate and regular rhythm.     Pulses: Normal pulses.     Heart sounds: Normal heart sounds, S1 normal and S2 normal. No murmur heard.    No friction rub. No gallop.  Pulmonary:     Effort:  Pulmonary effort is normal. No respiratory distress.     Breath sounds: Normal breath sounds.  Abdominal:     General: Bowel sounds are normal.     Palpations: Abdomen is soft.     Tenderness: There is no abdominal tenderness. There is no guarding or rebound.     Hernia: No hernia is present.  Musculoskeletal:        General: No swelling.     Cervical back: Full passive range of motion without pain, normal range of motion and neck supple. No spinous process tenderness or muscular tenderness. Normal range of motion.     Right lower leg: No edema.     Left lower leg: No edema.  Skin:    General: Skin is warm and dry.     Capillary Refill: Capillary refill takes  less than 2 seconds.     Findings: No ecchymosis, erythema, rash or wound.  Neurological:     General: No focal deficit present.     Mental Status: She is alert and oriented to person, place, and time.     GCS: GCS eye subscore is 4. GCS verbal subscore is 5. GCS motor subscore is 6.     Cranial Nerves: Cranial nerves 2-12 are intact.     Sensory: Sensation is intact.     Motor: Motor function is intact.     Coordination: Coordination is intact.  Psychiatric:        Attention and Perception: Attention normal.        Mood and Affect: Mood normal.        Speech: Speech normal.        Behavior: Behavior normal.     ED Results / Procedures / Treatments   Labs (all labs ordered are listed, but only abnormal results are displayed) Labs Reviewed  COMPREHENSIVE METABOLIC PANEL - Abnormal; Notable for the following components:      Result Value   CO2 19 (*)    Glucose, Bld 108 (*)    All other components within normal limits  CBC - Abnormal; Notable for the following components:   MCHC 29.9 (*)    All other components within normal limits  RESP PANEL BY RT-PCR (RSV, FLU A&B, COVID)  [id number]  LIPASE, BLOOD  URINALYSIS, ROUTINE W REFLEX MICROSCOPIC    EKG None  Radiology No results found.  Procedures Procedures    Medications Ordered in ED Medications  ondansetron (ZOFRAN-ODT) disintegrating tablet 4 mg (4 mg Oral Given 08/10/23 2226)  sodium chloride 0.9 % bolus 1,000 mL (0 mLs Intravenous Stopped 08/11/23 0323)  ondansetron (ZOFRAN) injection 4 mg (4 mg Intravenous Given 08/11/23 0203)  morphine (PF) 4 MG/ML injection 4 mg (4 mg Intravenous Given 08/11/23 0203)  acetaminophen (TYLENOL) tablet 1,000 mg (1,000 mg Oral Given 08/11/23 0332)  metoCLOPramide (REGLAN) injection 10 mg (10 mg Intravenous Given 08/11/23 1610)    ED Course/ Medical Decision Making/ A&P                                 Medical Decision Making Amount and/or Complexity of Data Reviewed Labs:  ordered.  Risk OTC drugs. Prescription drug management.   Since with flulike illness.  Patient does report that she has multiple sick contacts at home.  She was experiencing nausea, vomiting, abdominal pain with generalized myalgias and headache at arrival.  No meningismus.  She appears well.  Patient treated with antiemetics, morphine and abdominal  pain is improved.  Serial exams performed of her abdomen, no further abdominal pain or tenderness.  Upon recheck, patient complaining of headache.  This significantly improved after Reglan and Tylenol.  Blood work was unremarkable.  Urinalysis without signs of infection.  LFTs, lipase normal.  Electrolytes, kidney function normal.  Normal CBC, no anemia, normal white blood cell count of 8.5.  COVID, influenza, RSV negative.  Suspect viral illness, appropriate for discharge and symptomatic treatment.        Final Clinical Impression(s) / ED Diagnoses Final diagnoses:  Nausea and vomiting, unspecified vomiting type  Generalized abdominal pain  Flu-like symptoms    Rx / DC Orders ED Discharge Orders          Ordered    ondansetron (ZOFRAN-ODT) 4 MG disintegrating tablet        08/11/23 0624              Gilda Crease, MD 08/11/23 [phone]

## 2023-08-15 ENCOUNTER — Telehealth: Payer: Self-pay

## 2023-08-15 NOTE — Transitions of Care (Post Inpatient/ED Visit) (Signed)
 08/15/2023  Name: [name] MRN: [id number] DOB: 1956/04/26  Today's TOC FU Call Status: Today's TOC FU Call Status:: Successful TOC FU Call Completed TOC FU Call Complete Date: 08/15/23 Patient's Name and Date of Birth confirmed.  Transition Care Management Follow-up Telephone Call Date of Discharge: 08/11/23 Discharge Facility: [facility]) Type of Discharge: Emergency Department Reason for ED Visit: Other: (nausea, abdominal pain, headache) How have you been since you were released from the hospital?: Better Any questions or concerns?: No  Items Reviewed: Did you receive and understand the discharge instructions provided?: Yes Medications obtained,verified, and reconciled?: Yes (Medications Reviewed) Any new allergies since your discharge?: No Dietary orders reviewed?: NA Do you have support at home?: Yes People in Home: spouse  Medications Reviewed Today: Medications Reviewed Today     Reviewed by Casara Perrier, [place] LABOR, CMA (Certified Medical Assistant) on 08/15/23 at 1328  Med List Status: <None>   Medication Order Taking? Sig Documenting Provider Last Dose Status Informant  albuterol  (VENTOLIN  HFA) 108 (90 Base) MCG/ACT inhaler [id number] No Inhale 1-2 puffs into the lungs every 6 (six) hours as needed for wheezing or shortness of breath. Jordan, [name], MD Taking Active Self  budesonide -formoterol  (SYMBICORT ) 160-4.5 MCG/ACT inhaler [id number]  Inhale 2 puffs into the lungs 2 (two) times daily. Jordan, [name], MD  Active   calcium  carbonate (OS-CAL - DOSED IN MG OF ELEMENTAL CALCIUM ) 1250 (500 Ca) MG tablet [id number] No Take 1 tablet by mouth 2 (two) times daily with a meal. [provider] Taking Active Self  Cholecalciferol (VITAMIN D ) 50 MCG (2000 UT) CAPS [id number] No Take 2,000 Units by mouth daily. [provider] Taking Active Self  diclofenac  Sodium (VOLTAREN ) 1 % GEL [id number] No Apply 2 g topically 4 (four) times daily as needed (pain).  [provider] Taking Active Self  DULoxetine  (CYMBALTA ) 20 MG capsule [id number]  Take 2 capsules (40 mg total) by mouth daily. Jordan, [name], MD  Active   lisinopril  (ZESTRIL ) 40 MG tablet [id number] No TAKE 1 TABLET BY MOUTH DAILY Jordan, [name], MD Taking Active Self  methocarbamol  (ROBAXIN ) 500 MG tablet [id number]  Take 1 tablet (500 mg total) by mouth at bedtime as needed for muscle spasms. Jordan, [name], MD  Active   Multiple Vitamins-Minerals (BARIATRIC MULTIVITAMINS/IRON PO) [id number] No Take 1 tablet by mouth daily. [provider] Taking Active Self  ondansetron  (ZOFRAN -ODT) 4 MG disintegrating tablet [id number]  4mg  ODT q4 hours prn nausea/vomit Pollina, Lonni PARAS, MD  Active   phentermine  (ADIPEX-P ) 37.5 MG tablet [id number] No Take 37.5 mg by mouth daily with breakfast. [provider] Taking Active Self  rosuvastatin  (CRESTOR ) 10 MG tablet [id number] No Take 1 tablet (10 mg total) by mouth daily. Jordan, [name], MD Taking Active Self  trolamine salicylate (ASPERCREME) 10 % cream [id number] No Apply 1 application topically as needed for muscle pain. [provider] Taking Active Self            Home Care and Equipment/Supplies: Were Home Health Services Ordered?: NA Any new equipment or medical supplies ordered?: NA  Functional Questionnaire: Do you need assistance with bathing/showering or dressing?: No Do you need assistance with meal preparation?: No Do you need assistance with eating?: No Do you have difficulty maintaining continence: No Do you need assistance with getting out of bed/getting out of a chair/moving?: No Do you have difficulty managing or taking your medications?: No  Follow up appointments reviewed: PCP Follow-up appointment confirmed?:  Yes Date of PCP follow-up appointment?: 08/16/23 Follow-up Provider: Betty Jordan, MD Specialist Hospital Follow-up appointment confirmed?: NA Do you need transportation to your  follow-up appointment?: No Do you understand care options if your condition(s) worsen?: Yes-patient verbalized understanding    Arlys Scatena, CMA  [facility] AW[facility] Team Direct Dial: [phone]

## 2023-08-16 ENCOUNTER — Inpatient Hospital Stay: Payer: Medicare Other | Admitting: Family Medicine

## 2023-09-06 ENCOUNTER — Encounter (INDEPENDENT_AMBULATORY_CARE_PROVIDER_SITE_OTHER): Payer: Self-pay

## 2023-10-12 ENCOUNTER — Other Ambulatory Visit: Payer: Self-pay | Admitting: Family Medicine

## 2023-10-12 DIAGNOSIS — I10 Essential (primary) hypertension: Secondary | ICD-10-CM

## 2023-11-23 ENCOUNTER — Telehealth: Payer: Self-pay | Admitting: *Deleted

## 2023-11-23 NOTE — Telephone Encounter (Signed)
 Copied from CRM [id number]. Topic: Clinical - Medication Question >> Nov 23, 2023 10:59 AM [name] wrote: Reason for CRM:pt called to speak with provider regarding referral for health and [facility] please call pt back at [phone]

## 2023-12-11 ENCOUNTER — Ambulatory Visit (INDEPENDENT_AMBULATORY_CARE_PROVIDER_SITE_OTHER): Payer: Medicare Other | Admitting: Family Medicine

## 2023-12-11 VITALS — BP 140/70 | HR 71 | Temp 98.4°F | Resp 16 | Ht 65.0 in | Wt 288.4 lb

## 2023-12-11 DIAGNOSIS — M25572 Pain in left ankle and joints of left foot: Secondary | ICD-10-CM | POA: Diagnosis not present

## 2023-12-11 DIAGNOSIS — J4520 Mild intermittent asthma, uncomplicated: Secondary | ICD-10-CM | POA: Diagnosis not present

## 2023-12-11 DIAGNOSIS — I10 Essential (primary) hypertension: Secondary | ICD-10-CM | POA: Diagnosis not present

## 2023-12-11 DIAGNOSIS — M5116 Intervertebral disc disorders with radiculopathy, lumbar region: Secondary | ICD-10-CM

## 2023-12-11 NOTE — Patient Instructions (Addendum)
 A few things to remember from today's visit:  Essential hypertension  Radiculopathy due to lumbar intervertebral disc disorder  Asthma in adult, mild intermittent, uncomplicated Start taking Duloxetine  40 mg daily. Continue Methocarbamol  daily as needed. Ankle pain seems to be caused by sprain ligament. Continue ABC exercises. Call to establish with wt loss clinic or arrange follow up with provider who prescribed Phentermine .  If you need refills for medications you take chronically, please call your pharmacy. Do not use My Chart to request refills or for acute issues that need immediate attention. If you send a my chart message, it may take a few days to be addressed, specially if I am not in the office.  Please be sure medication list is accurate. If a new problem present, please set up appointment sooner than planned today.

## 2023-12-11 NOTE — Assessment & Plan Note (Signed)
 Problem is adequately controlled. Continue Symbicort  160-4.5 mcg once daily, during exacerbations she can take 1 puff up to 6 times per day. Also on albuterol  inhaler 1 to 2 puff every 6 hours. Continue swish after use.

## 2023-12-11 NOTE — Assessment & Plan Note (Signed)
 She has been referred to weight loss clinic a few times, planning on calling to schedule appointments. Noticed that she was on phentermine  later last year, states she did not continue medication because she ran out, she did not follow-up with provider. She understands the benefits of wt loss as well as adverse effects of obesity. Consistency with healthy diet and physical activity as tolerated encouraged.

## 2023-12-11 NOTE — Assessment & Plan Note (Addendum)
 BP elevated today. She reported home BPs 120s/60's, so for now continue lisinopril  40 mg daily. Continue monitoring BP regularly. Continue low-salt diet. Follow-up in 6 months, before if needed.

## 2023-12-11 NOTE — Assessment & Plan Note (Signed)
 She has not been taking duloxetine  daily. We discussed options, she would like to try again, start duloxetine  20 mg 2 capsules daily recommended. Continue methocarbamol  500 mg daily at bedtime as needed.

## 2023-12-11 NOTE — Progress Notes (Signed)
 HPI: [name] is a 67 y.o. female with a PMHx significant for HTN, asthma, OSA, lumbar radiculopathy, OA, B12 deficiency, HLD, prediabetes, and depression, among others, who is here today with her husband for chronic disease management.  Last seen on 06/12/2023. She went to the ER for nausea and vomiting in 07/2023 and says that has resolved.   Hypertension:  Currently on lisinopril  40 mg daily.  She checks her BP regularly at home and says her systolic readings are usually in the 120s. Today in the office it is 140/58.  She mentions she has occasional frontal "nagging" headaches and sometimes blurry vision. She had her last eye exam in 09/2023, and was already having those symptoms at that time. New eye glasses rx was given, not wearing them yet. Negative for exertional chest pain, dyspnea, palpitations, focal weakness, or edema.  Lab Results  Component Value Date   CREATININE 0.70 08/10/2023   BUN 21 08/10/2023   NA 139 08/10/2023   K 3.5 08/10/2023   CL 109 08/10/2023   CO2 19 (L) 08/10/2023   Back pain/ankle pain:  Currently on duloxetine  20 mg and methocarbamol  500 mg as needed. She has been taking the duloxetine  as needed instead of daily.  She says her back pain has been present but stable lately.  Lower back pain radiated to left hip and thigh. Today she is complaining of left ankle pain for the past 1 to 2 weeks. No history of trauma. The pain is worse when she is raising her leg or in bed or seated, but is relieved by putting her foot on the ground.  Also having pretibial pain. No erythema or deformity. She tried Bactine spray for her ankle, which helped, and Aspercreme, which did not.  Denies numbness or tingling or saddle anesthesia.  Surgical treatment has been recommended for lumbar radiculopathy but BMI needs to be under 40.   Due to chronic pain, she does not exercise regularly. She has been given referral to weight loss clinic as she requested. In  04/2023 she was prescribed phentermine , she is no longer taking it because she ran out of medication.  Status post sleeve gastrectomy, last follow-up at [facility] surgery on 06/28/2023.  Asthma:  Currently on Symbicort  2 puffs daily.  She uses her albuterol  once per week.  She says she has had a cough for several weeks, greenish sputum, no hemoptysis. Negative for fever, chills, chest tightness, or wheezing.  Review of Systems  Constitutional:  Negative for activity change and appetite change.  HENT:  Negative for sore throat.   Gastrointestinal:  Negative for abdominal pain, nausea and vomiting.  Endocrine: Negative for cold intolerance and heat intolerance.  Genitourinary:  Negative for decreased urine volume, dysuria and hematuria.  Musculoskeletal:  Positive for arthralgias, back pain and gait problem.  Skin:  Negative for rash.  Neurological:  Negative for syncope and facial asymmetry.  Psychiatric/Behavioral:  Negative for confusion and hallucinations.   See other pertinent positives and negatives in HPI.  Current Outpatient Medications on File Prior to Visit  Medication Sig Dispense Refill   albuterol  (VENTOLIN  HFA) 108 (90 Base) MCG/ACT inhaler Inhale 1-2 puffs into the lungs every 6 (six) hours as needed for wheezing or shortness of breath. 3 each 0   budesonide -formoterol  (SYMBICORT ) 160-4.5 MCG/ACT inhaler Inhale 2 puffs into the lungs 2 (two) times daily. 3 each 4   calcium  carbonate (OS-CAL - DOSED IN MG OF ELEMENTAL CALCIUM ) 1250 (500 Ca) MG tablet Take  1 tablet by mouth 2 (two) times daily with a meal.     Cholecalciferol (VITAMIN D ) 50 MCG (2000 UT) CAPS Take 2,000 Units by mouth daily.     DULoxetine  (CYMBALTA ) 20 MG capsule Take 2 capsules (40 mg total) by mouth daily.     lisinopril  (ZESTRIL ) 40 MG tablet TAKE 1 TABLET BY MOUTH DAILY 100 tablet 2   methocarbamol  (ROBAXIN ) 500 MG tablet Take 1 tablet (500 mg total) by mouth at bedtime as needed for muscle  spasms. 30 tablet 2   ondansetron  (ZOFRAN -ODT) 4 MG disintegrating tablet 4mg  ODT q4 hours prn nausea/vomit 10 tablet 0   rosuvastatin  (CRESTOR ) 10 MG tablet Take 1 tablet (10 mg total) by mouth daily. 90 tablet 3   trolamine salicylate (ASPERCREME) 10 % cream Apply 1 application topically as needed for muscle pain.     diclofenac  Sodium (VOLTAREN ) 1 % GEL Apply 2 g topically 4 (four) times daily as needed (pain). (Patient not taking: Reported on 12/11/2023)     Multiple Vitamins-Minerals (BARIATRIC MULTIVITAMINS/IRON PO) Take 1 tablet by mouth daily.     phentermine  (ADIPEX-P ) 37.5 MG tablet Take 37.5 mg by mouth daily with breakfast. (Patient not taking: Reported on 12/11/2023)     No current facility-administered medications on file prior to visit.   Past Medical History:  Diagnosis Date   Abnormal EKG 06/2003   History of inverted T waves V1-V3. Normal 2D echo (07/23/2003): LVEF 65%.   Anemia    BL Hgb 11-12. Ferritin 14 - low normal (08/2007). Colonoscopy 2009 - external hemorrhoids (excellent prep). Last anemia panel (12/2010) - Iron  24, TIBC 269,  B12  316, Folate 11.9, Ferritin 73.   Asthma    B12 deficiency    Back pain    Chest pain    Degenerative joint disease    BL knees (L>R), lumbar spine. Followed by Sports Medicine, Dr. Andree Kayser.   Dyspnea    Gastric bypass status for obesity    History of multiple pulmonary nodules    Incidental finding: CT Abd/ Pelvis (04/2010) - Several small lower lobe lung nodules, including one pure ground-glass pulmonary nodule measuring 8 mm in the left lower lobe.  Recommend follow-up chest CT (IV contrast preferred) in 6 months to document stability. //  CT Abd/ Pelvis (07/2010) -  3 mm RLL and  8 mm LLL nodule stable.  Other nodules unchanged, likely benign.   Hyperlipidemia    Hypertension    Incarcerated ventral hernia 04/2010   Noted on CT Abd/ pelvis (04/2010). Patient now s/p ventral hernia repair by Dr. Sofia Dunn (12/2010)   Insomnia    Knee  osteoarthritis    s/p Left total knee replacement (06/2011)   Left hip pain    Left ventricular diastolic dysfunction    Leg edema    Lower extremity edema    Chronic. 2D echo (2005) - EF 65%.   Obesity    OSA (obstructive sleep apnea)    Palpitations    Positive D dimer    Pre-diabetes    Right knee pain    Allergies  Allergen Reactions   Ketorolac Tromethamine Shortness Of Breath and Palpitations   Atrovent  [Ipratropium] Hives and Rash   Latex Rash and Other (See Comments)    NO POWDERED GLOVES!!!!   Tape Dermatitis    Irritates skin     Social History   Socioeconomic History   Marital status: Married    Spouse name: Kerin Pebbles   Number of children: 3  Years of education: Not on file   Highest education level: 12th grade  Occupational History   Occupation: Stay at home  Tobacco Use   Smoking status: Former    Current packs/day: 0.00    Average packs/day: 0.5 packs/day for 20.0 years (10.0 ttl pk-yrs)    Types: Cigarettes    Start date: 09/10/1978    Quit date: 09/10/1998    Years since quitting: 25.2   Smokeless tobacco: Never   Tobacco comments:    68 yo   Vaping Use   Vaping status: Never Used  Substance and Sexual Activity   Alcohol use: No    Alcohol/week: 0.0 standard drinks of alcohol   Drug use: No   Sexual activity: Yes    Partners: Male  Other Topics Concern   Not on file  Social History Narrative   10/05/2018: Lives with husband, daughter, and 2 grandchildren in [place] house. Lives on main level though mostly.   Has three children altogether, all local, supportive   Currently getting ramp installed in house d/t pt difficulty getting upstairs.      Social Drivers of Corporate investment banker Strain: Low Risk  (12/11/2023)   Overall Financial Resource Strain (CARDIA)    Difficulty of Paying Living Expenses: Not hard at all  Food Insecurity: No Food Insecurity (12/11/2023)   Hunger Vital Sign    Worried About Running Out of Food in the Last Year:  Never true    Ran Out of Food in the Last Year: Never true  Transportation Needs: No Transportation Needs (12/11/2023)   PRAPARE - Administrator, Civil Service (Medical): No    Lack of Transportation (Non-Medical): No  Physical Activity: Inactive (12/11/2023)   Exercise Vital Sign    Days of Exercise per Week: 0 days    Minutes of Exercise per Session: 20 min  Stress: No Stress Concern Present (12/11/2023)   [employer] of Occupational Health - Occupational Stress Questionnaire    Feeling of Stress : Not at all  Social Connections: Unknown (12/11/2023)   Social Connection and Isolation Panel [NHANES]    Frequency of Communication with Friends and Family: Three times a week    Frequency of Social Gatherings with Friends and Family: Once a week    Attends Religious Services: Patient declined    Active Member of Clubs or Organizations: Yes    Attends Banker Meetings: Patient declined    Marital Status: Married   Vitals:   12/11/23 1040 12/11/23 1050  BP: (!) 140/58 (!) 140/70  Pulse: 71   Resp: 16   Temp: 98.4 F (36.9 C)   SpO2: 96%    Wt Readings from Last 3 Encounters:  12/11/23 288 lb 6.4 oz (130.8 kg)  05/22/23 260 lb (117.9 kg)  03/06/23 256 lb (116.1 kg)   Body mass index is 47.99 kg/m.  Physical Exam Vitals and nursing note reviewed.  Constitutional:      General: She is not in acute distress.    Appearance: She is well-developed.  HENT:     Head: Normocephalic and atraumatic.     Mouth/Throat:     Mouth: Mucous membranes are moist.     Pharynx: Oropharynx is clear.  Eyes:     Conjunctiva/sclera: Conjunctivae normal.  Cardiovascular:     Rate and Rhythm: Normal rate and regular rhythm.     Pulses:          Dorsalis pedis pulses are 2+ on the right  side and 2+ on the left side.     Heart sounds: No murmur heard. Pulmonary:     Effort: Pulmonary effort is normal. No respiratory distress.     Breath sounds: Normal breath sounds.   Abdominal:     Palpations: Abdomen is soft. There is no mass.     Tenderness: There is no abdominal tenderness.  Musculoskeletal:     Right lower leg: No tenderness. No edema.     Left lower leg: No tenderness. No edema.     Left ankle: No swelling. Tenderness (mild) present over the lateral malleolus and ATF ligament. Normal range of motion (No pain with movement.).     Left foot: Tenderness present.  Lymphadenopathy:     Cervical: No cervical adenopathy.  Skin:    General: Skin is warm.     Findings: No erythema or rash.  Neurological:     General: No focal deficit present.     Mental Status: She is alert and oriented to person, place, and time.     Comments: In a wheel chair today.  Psychiatric:        Mood and Affect: Mood and affect normal.   ASSESSMENT AND PLAN:  Ms. Lacewell was seen today for chronic disease management.   Radiculopathy due to lumbar intervertebral disc disorder Assessment & Plan: She has not been taking duloxetine  daily. We discussed options, she would like to try again, start duloxetine  20 mg 2 capsules daily recommended. Continue methocarbamol  500 mg daily at bedtime as needed.  Asthma in adult, mild intermittent, uncomplicated Assessment & Plan: Problem is adequately controlled. Continue Symbicort  160-4.5 mcg once daily, during exacerbations she can take 1 puff up to 6 times per day. Also on albuterol  inhaler 1 to 2 puff every 6 hours. Continue swish after use.  Essential hypertension Assessment & Plan: BP elevated today. She reported home BPs 120s/60's, so for now continue lisinopril  40 mg daily. Continue monitoring BP regularly. Continue low-salt diet. Follow-up in 6 months, before if needed.  Obesity, morbid (HCC) Assessment & Plan: She has been referred to weight loss clinic a few times, planning on calling to schedule appointments. Noticed that she was on phentermine  later last year, states she did not continue medication because she ran  out, she did not follow-up with provider. She understands the benefits of wt loss as well as adverse effects of obesity. Consistency with healthy diet and physical activity as tolerated encouraged.  Acute left ankle pain We discussed possible etiologies,? Sprain ankle, OA and less probably radicular pain. Continue OTC topical Bactine and capsicin like products. Ankle ABC ROM exercises recommended.  I do not think imaging is needed at this time. Instructed about warning signs.  Return in about 6 months (around 06/11/2024) for chronic problems, Labs.  I, Fritz Jewel Wierda, acting as a scribe for Cletus Mehlhoff [name], MD., have documented all relevant documentation on the behalf of Vayda Dungee [name], MD, as directed by  Alannah Averhart [name], MD while in the presence of Duey Liller [name], MD.   I, Ophelia Sipe [name], MD, have reviewed all documentation for this visit. The documentation on 12/11/23 for the exam, diagnosis, procedures, and orders are all accurate and complete.  Georgeann Brinkman G. [name], MD  [facility]. [place] office.

## 2023-12-14 DIAGNOSIS — R5382 Chronic fatigue, unspecified: Secondary | ICD-10-CM | POA: Diagnosis not present

## 2023-12-14 DIAGNOSIS — E785 Hyperlipidemia, unspecified: Secondary | ICD-10-CM | POA: Diagnosis not present

## 2023-12-14 DIAGNOSIS — E611 Iron deficiency: Secondary | ICD-10-CM | POA: Diagnosis not present

## 2023-12-14 DIAGNOSIS — I10 Essential (primary) hypertension: Secondary | ICD-10-CM | POA: Diagnosis not present

## 2023-12-14 DIAGNOSIS — R7303 Prediabetes: Secondary | ICD-10-CM | POA: Diagnosis not present

## 2023-12-14 DIAGNOSIS — E559 Vitamin D deficiency, unspecified: Secondary | ICD-10-CM | POA: Diagnosis not present

## 2023-12-19 DIAGNOSIS — I10 Essential (primary) hypertension: Secondary | ICD-10-CM | POA: Diagnosis not present

## 2023-12-19 DIAGNOSIS — M1990 Unspecified osteoarthritis, unspecified site: Secondary | ICD-10-CM | POA: Diagnosis not present

## 2023-12-19 DIAGNOSIS — E611 Iron deficiency: Secondary | ICD-10-CM | POA: Diagnosis not present

## 2023-12-19 DIAGNOSIS — R7303 Prediabetes: Secondary | ICD-10-CM | POA: Diagnosis not present

## 2023-12-19 DIAGNOSIS — E559 Vitamin D deficiency, unspecified: Secondary | ICD-10-CM | POA: Diagnosis not present

## 2023-12-25 DIAGNOSIS — I10 Essential (primary) hypertension: Secondary | ICD-10-CM | POA: Diagnosis not present

## 2023-12-25 DIAGNOSIS — G4730 Sleep apnea, unspecified: Secondary | ICD-10-CM | POA: Diagnosis not present

## 2023-12-27 ENCOUNTER — Other Ambulatory Visit: Payer: Self-pay | Admitting: Family Medicine

## 2023-12-27 DIAGNOSIS — E785 Hyperlipidemia, unspecified: Secondary | ICD-10-CM

## 2023-12-30 ENCOUNTER — Emergency Department (HOSPITAL_COMMUNITY)

## 2023-12-30 ENCOUNTER — Emergency Department (HOSPITAL_COMMUNITY)
Admission: EM | Admit: 2023-12-30 | Discharge: 2023-12-30 | Disposition: A | Discharge status: Home / Self Care | Attending: Student | Admitting: Student

## 2023-12-30 ENCOUNTER — Other Ambulatory Visit: Payer: Self-pay

## 2023-12-30 ENCOUNTER — Encounter (HOSPITAL_COMMUNITY): Payer: Self-pay

## 2023-12-30 DIAGNOSIS — R059 Cough, unspecified: Secondary | ICD-10-CM | POA: Diagnosis present

## 2023-12-30 DIAGNOSIS — R079 Chest pain, unspecified: Secondary | ICD-10-CM | POA: Diagnosis not present

## 2023-12-30 DIAGNOSIS — I10 Essential (primary) hypertension: Secondary | ICD-10-CM | POA: Insufficient documentation

## 2023-12-30 DIAGNOSIS — R197 Diarrhea, unspecified: Secondary | ICD-10-CM | POA: Insufficient documentation

## 2023-12-30 DIAGNOSIS — J210 Acute bronchiolitis due to respiratory syncytial virus: Secondary | ICD-10-CM | POA: Diagnosis not present

## 2023-12-30 DIAGNOSIS — Z91040 Latex allergy status: Secondary | ICD-10-CM | POA: Diagnosis not present

## 2023-12-30 DIAGNOSIS — Z7951 Long term (current) use of inhaled steroids: Secondary | ICD-10-CM | POA: Insufficient documentation

## 2023-12-30 DIAGNOSIS — R0602 Shortness of breath: Secondary | ICD-10-CM | POA: Diagnosis present

## 2023-12-30 DIAGNOSIS — Z79899 Other long term (current) drug therapy: Secondary | ICD-10-CM | POA: Diagnosis not present

## 2023-12-30 DIAGNOSIS — J45909 Unspecified asthma, uncomplicated: Secondary | ICD-10-CM | POA: Insufficient documentation

## 2023-12-30 DIAGNOSIS — R0789 Other chest pain: Secondary | ICD-10-CM | POA: Diagnosis not present

## 2023-12-30 DIAGNOSIS — R42 Dizziness and giddiness: Secondary | ICD-10-CM | POA: Diagnosis not present

## 2023-12-30 DIAGNOSIS — R509 Fever, unspecified: Secondary | ICD-10-CM | POA: Diagnosis not present

## 2023-12-30 LAB — CBC WITH DIFFERENTIAL/PLATELET
Abs Immature Granulocytes: 0.01 10*3/uL (ref 0.00–0.07)
Basophils Absolute: 0.1 10*3/uL (ref 0.0–0.1)
Basophils Relative: 1 %
Eosinophils Absolute: 0.3 10*3/uL (ref 0.0–0.5)
Eosinophils Relative: 7 %
HCT: 40.6 % (ref 36.0–46.0)
Hemoglobin: 12.7 g/dL (ref 12.0–15.0)
Immature Granulocytes: 0 %
Lymphocytes Relative: 41 %
Lymphs Abs: 1.8 10*3/uL (ref 0.7–4.0)
MCH: 29.1 pg (ref 26.0–34.0)
MCHC: 31.3 g/dL (ref 30.0–36.0)
MCV: 93.1 fL (ref 80.0–100.0)
Monocytes Absolute: 0.3 10*3/uL (ref 0.1–1.0)
Monocytes Relative: 7 %
Neutro Abs: 1.9 10*3/uL (ref 1.7–7.7)
Neutrophils Relative %: 44 %
Platelets: 281 10*3/uL (ref 150–400)
RBC: 4.36 MIL/uL (ref 3.87–5.11)
RDW: 13.5 % (ref 11.5–15.5)
WBC: 4.3 10*3/uL (ref 4.0–10.5)
nRBC: 0 % (ref 0.0–0.2)

## 2023-12-30 LAB — BASIC METABOLIC PANEL WITH GFR
Anion gap: 10 (ref 5–15)
BUN: 10 mg/dL (ref 8–23)
CO2: 23 mmol/L (ref 22–32)
Calcium: 9.5 mg/dL (ref 8.9–10.3)
Chloride: 108 mmol/L (ref 98–111)
Creatinine, Ser: 0.77 mg/dL (ref 0.44–1.00)
GFR, Estimated: >60 mL/min (ref >60)
Glucose, Bld: 96 mg/dL (ref 70–99)
Potassium: 4 mmol/L (ref 3.5–5.1)
Sodium: 141 mmol/L (ref 135–145)

## 2023-12-30 LAB — RESP PANEL BY RT-PCR (RSV, FLU A&B, COVID)  RVPGX2
Influenza A by PCR: NEGATIVE
Influenza B by PCR: NEGATIVE
Resp Syncytial Virus by PCR: POSITIVE — AB
SARS Coronavirus 2 by RT PCR: NEGATIVE

## 2023-12-30 LAB — BRAIN NATRIURETIC PEPTIDE: B Natriuretic Peptide: 25.8 pg/mL (ref 0.0–100.0)

## 2023-12-30 MED ORDER — METHYLPREDNISOLONE SODIUM SUCC 125 MG IJ SOLR
125.0000 mg | Freq: Once | INTRAMUSCULAR | Status: AC
  Administered 2023-12-30: 125 mg via INTRAVENOUS
  Filled 2023-12-30: qty 2

## 2023-12-30 MED ORDER — ALBUTEROL SULFATE (2.5 MG/3ML) 0.083% IN NEBU
2.5000 mg | INHALATION_SOLUTION | Freq: Once | RESPIRATORY_TRACT | Status: AC
  Administered 2023-12-30: 2.5 mg via RESPIRATORY_TRACT
  Filled 2023-12-30: qty 3

## 2023-12-30 MED ORDER — ALBUTEROL SULFATE HFA 108 (90 BASE) MCG/ACT IN AERS
2.0000 | INHALATION_SPRAY | Freq: Once | RESPIRATORY_TRACT | Status: AC
  Administered 2023-12-30: 2 via RESPIRATORY_TRACT
  Filled 2023-12-30: qty 6.7

## 2023-12-30 MED ORDER — SODIUM CHLORIDE 0.9 % IV BOLUS
1000.0000 mL | Freq: Once | INTRAVENOUS | Status: AC
  Administered 2023-12-30: 1000 mL via INTRAVENOUS

## 2023-12-30 MED ORDER — BENZONATATE 100 MG PO CAPS: 100.0000 mg | ORAL_CAPSULE | Freq: Three times a day (TID) | ORAL | 0 refills | Status: AC

## 2023-12-30 NOTE — ED Provider Notes (Signed)
 [place] [facility] AT [facility] Provider Note   CSN: [id number] Arrival date & time: 12/30/23  1134     Patient presents with: Chest Pain, Cough, Emesis, and Diarrhea  HPI [name] is a 68 y.o. female with history of hypertension, asthma, hyperlipidemia presenting for cough, nasal congestion, nausea and vomiting and lightheadedness.  She states this all started about a week ago.  The cough is nonproductive.  Also reports a low-grade fever.  States she has vomited a couple times in this past week but denies abdominal pain.  She has also had diarrhea.  She is also reporting lightheadedness    Chest Pain Associated symptoms: cough and vomiting   Cough Associated symptoms: chest pain   Emesis Associated symptoms: cough and diarrhea   Diarrhea Associated symptoms: vomiting        Prior to Admission medications   Medication Sig Start Date End Date Taking? Authorizing Provider  albuterol  (VENTOLIN  HFA) 108 (90 Base) MCG/ACT inhaler Inhale 1-2 puffs into the lungs every 6 (six) hours as needed for wheezing or shortness of breath. 04/28/20   [name], [name], MD  budesonide -formoterol  (SYMBICORT ) 160-4.5 MCG/ACT inhaler Inhale 2 puffs into the lungs 2 (two) times daily. 06/12/23   [name], [name], MD  calcium  carbonate (OS-CAL - DOSED IN MG OF ELEMENTAL CALCIUM ) 1250 (500 Ca) MG tablet Take 1 tablet by mouth 2 (two) times daily with a meal.    [provider]  Cholecalciferol (VITAMIN D ) 50 MCG (2000 UT) CAPS Take 2,000 Units by mouth daily.    [provider]  diclofenac  Sodium (VOLTAREN ) 1 % GEL Apply 2 g topically 4 (four) times daily as needed (pain). Patient not taking: Reported on 12/11/2023    [provider]  DULoxetine  (CYMBALTA ) 20 MG capsule Take 2 capsules (40 mg total) by mouth daily. 06/12/23   [name], [name], MD  lisinopril  (ZESTRIL ) 40 MG tablet TAKE 1 TABLET BY MOUTH DAILY 10/13/23   [name], [name], MD  methocarbamol   (ROBAXIN ) 500 MG tablet Take 1 tablet (500 mg total) by mouth at bedtime as needed for muscle spasms. 06/12/23   [name], [name], MD  Multiple Vitamins-Minerals (BARIATRIC MULTIVITAMINS/IRON PO) Take 1 tablet by mouth daily.    [provider]  ondansetron  (ZOFRAN -ODT) 4 MG disintegrating tablet 4mg  ODT q4 hours prn nausea/vomit 08/11/23   Pollina, Lonni PARAS, MD  phentermine  (ADIPEX-P ) 37.5 MG tablet Take 37.5 mg by mouth daily with breakfast. Patient not taking: Reported on 12/11/2023 05/05/23   [provider]  rosuvastatin  (CRESTOR ) 10 MG tablet TAKE 1 TABLET BY MOUTH EVERY DAY 12/27/23   [name], [name], MD  trolamine salicylate (ASPERCREME) 10 % cream Apply 1 application topically as needed for muscle pain.    [provider]    Allergies: Ketorolac tromethamine, Atrovent  [ipratropium], Latex, and Tape    Review of Systems  Respiratory:  Positive for cough.   Cardiovascular:  Positive for chest pain.  Gastrointestinal:  Positive for diarrhea and vomiting.    Updated Vital Signs BP (!) 151/65   Pulse 65   Temp 97.9 F (36.6 C)   Resp 18   Ht 5' 5 (1.651 m)   Wt 130.6 kg   LMP 09/09/2009   SpO2 100%   BMI 47.93 kg/m   Physical Exam Vitals and nursing note reviewed.  HENT:     Head: Normocephalic and atraumatic.     Mouth/Throat:     Mouth: Mucous membranes are  moist.   Eyes:     General:        Right eye: No discharge.        Left eye: No discharge.     Conjunctiva/sclera: Conjunctivae normal.    Cardiovascular:     Rate and Rhythm: Normal rate and regular rhythm.     Pulses: Normal pulses.     Heart sounds: Normal heart sounds.  Pulmonary:     Effort: Pulmonary effort is normal.     Breath sounds: Examination of the right-lower field reveals wheezing. Examination of the left-lower field reveals wheezing. Wheezing present. No decreased breath sounds or rales.  Abdominal:     General: Abdomen is flat.     Palpations: Abdomen is soft.    Skin:    General: Skin is warm and dry.   Neurological:     General: No focal deficit present.   Psychiatric:        Mood and Affect: Mood normal.     (all labs ordered are listed, but only abnormal results are displayed) Labs Reviewed  RESP PANEL BY RT-PCR (RSV, FLU A&B, COVID)  RVPGX2 - Abnormal; Notable for the following components:      Result Value   Resp Syncytial Virus by PCR POSITIVE (*)    All other components within normal limits  BASIC METABOLIC PANEL WITH GFR  BRAIN NATRIURETIC PEPTIDE  CBC WITH DIFFERENTIAL/PLATELET    EKG: None  Radiology: DG Chest 2 View Result Date: 12/30/2023 CLINICAL DATA:  Chest pain, cough, and shortness of breath with mild fever EXAM: CHEST - 2 VIEW COMPARISON:  Chest radiograph dated 05/22/2023 FINDINGS: Normal lung volumes. No focal consolidations. No pleural effusion or pneumothorax. The heart size and mediastinal contours are within normal limits. No acute osseous abnormality. IMPRESSION: No active cardiopulmonary disease. Electronically Signed   By: Limin  Xu M.D.   On: 12/30/2023 13:18     Procedures   Medications Ordered in the ED  albuterol  (VENTOLIN  HFA) 108 (90 Base) MCG/ACT inhaler 2 puff (2 puffs Inhalation Given 12/30/23 1229)  methylPREDNISolone  sodium succinate (SOLU-MEDROL ) 125 mg/2 mL injection 125 mg (125 mg Intravenous Given 12/30/23 1414)  albuterol  (PROVENTIL ) (2.5 MG/3ML) 0.083% nebulizer solution 2.5 mg (2.5 mg Nebulization Given 12/30/23 1414)  sodium chloride  0.9 % bolus 1,000 mL (1,000 mLs Intravenous New Bag/Given 12/30/23 1414)                                    Medical Decision Making Risk Prescription drug management.   Initial Impression and Ddx 68 year old well-appearing female presenting for shortness of breath.  Exam notable for expiratory wheezing in the lung bases bilaterally.  DDx includes COPD/asthma exacerbation, ACS, CHF, PE, pneumonia, sepsis, viral illness, other. Patient PMH that  increases complexity of ED encounter:  history of hypertension, asthma, hyperlipidemia   Interpretation of Diagnostics - I independent reviewed and interpreted the labs as followed: Respiratory PCR positive for RSV  - I independently visualized the following imaging with scope of interpretation limited to determining acute life threatening conditions related to emergency care: CXR, which revealed no acute finding  -I personally viewed and interpreted EKG which revealed NSR  Patient Reassessment and Ultimate Disposition/Management Patient remained well-appearing, hemodynamically stable and no evidence of respiratory distress.  Respiratory PCR was positive for RSV.  Suspect this is likely etiology of her symptoms.  She may also have a mild COPD versus asthma exacerbation.  Treated with albuterol  and Solu-Medrol .  At this time she is safe to be discharged and does not meet inpatient criteria.  Discussed return precautions.  Advised to follow-up with her PCP and discussed supportive treatment for RSV at home.  Discharged in good condition.  Patient management required discussion with the following services or consulting groups:  None  Complexity of Problems Addressed Acute complicated illness or Injury  Additional Data Reviewed and Analyzed Further history obtained from: Past medical history and medications listed in the EMR and Prior ED visit notes  Patient Encounter Risk Assessment Prescriptions      Final diagnoses:  RSV (acute bronchiolitis due to respiratory syncytial virus)    ED Discharge Orders     None          Lang Norleen POUR, PA-C 12/30/23 1500    Albertina Dixon, MD 12/30/23 2130

## 2023-12-30 NOTE — ED Triage Notes (Signed)
 Patient reports mon or tues started having cough congestion n/v/d and dizziness.  Reports low grade fever.  Also complains of chest pain that goes into her back with coughing.

## 2023-12-30 NOTE — ED Provider Triage Note (Addendum)
 Emergency Medicine Provider Triage Evaluation Note  [name] , a 68 y.o. female  was evaluated in triage.  Pt complains of cough and congestion along with generalized weakness and dizziness that has been present for approximately 5 days.  She has a previous history of asthma, has a further history of allergic rhinitis.  States that she has had a productive cough with yellow-green phlegm, shortness of breath that increases with exertion.  Review of Systems  Positive: Cough, exertional dyspnea, fever, generalized weakness Negative: Focal weakness  Physical Exam  BP (!) 148/63   Pulse (!) 58   Temp 97.9 F (36.6 C)   Resp 18   Ht 5' 5 (1.651 m)   Wt 130.6 kg   LMP 09/09/2009   SpO2 93%   BMI 47.93 kg/m  Gen:   Awake, no distress   Resp:  Normal effort, expiratory wheeze noticed primarily in the right upper and lower lung fields, though good air entry into all lung fields noted. MSK:   Moves extremities without difficulty  Other:  Posterior oropharynx is unremarkable, tender lymphadenopathy to the left submandibular region  Medical Decision Making  Medically screening exam initiated at 12:22 PM.  Appropriate orders placed.  [name] was informed that the remainder of the evaluation will be completed by another provider, this initial triage assessment does not replace that evaluation, and the importance of remaining in the ED until their evaluation is complete.  Based on evaluation this patient will begin workup to initially rule out fluid overload/congestive heart failure, also workup for pneumonia and upper respiratory infection.  Will also provide patient with albuterol  MDI to begin treatment for expiratory wheeze noted on exam.   Myriam Dorn BROCKS, PA 12/30/23 1225    Myriam Dorn BROCKS, [place] 12/30/23 1225

## 2023-12-30 NOTE — Discharge Instructions (Addendum)
 Evaluation today revealed you have RSV which is a respiratory viral infection.  Treatment at home is supportive which includes assertive hydration, rest and Tylenol  and ibuprofen  as needed for fever control and symptomatic relief.  If you have worsening shortness of breath, uncontrolled fever, chest pain, or any other concerning symptom please return to the ED for further evaluation.  Otherwise recommend follow-up with your PCP.

## 2023-12-30 NOTE — ED Notes (Signed)
 Pt ambulated, pt oxygen  stayed 98% and greater while ambulating, tolerated well

## 2024-01-03 ENCOUNTER — Telehealth: Payer: Self-pay

## 2024-01-03 NOTE — Telephone Encounter (Signed)
 Attempted to call pt for hospital follow up

## 2024-01-04 ENCOUNTER — Other Ambulatory Visit: Payer: Self-pay | Admitting: Family Medicine

## 2024-01-04 DIAGNOSIS — M159 Polyosteoarthritis, unspecified: Secondary | ICD-10-CM

## 2024-01-04 DIAGNOSIS — I10 Essential (primary) hypertension: Secondary | ICD-10-CM | POA: Diagnosis not present

## 2024-01-04 DIAGNOSIS — M5116 Intervertebral disc disorders with radiculopathy, lumbar region: Secondary | ICD-10-CM

## 2024-01-05 ENCOUNTER — Telehealth: Payer: Self-pay

## 2024-01-05 NOTE — Transitions of Care (Post Inpatient/ED Visit) (Signed)
   01/05/2024  Name: [name] MRN: [id number] DOB: 1956-03-27  Today's TOC FU Call Status: Today's TOC FU Call Status:: Unsuccessful Call (2nd Attempt) Unsuccessful Call (2nd Attempt) Date: 01/05/24  Attempted to reach the patient regarding the most recent Inpatient/ED visit.  Follow Up Plan: Additional outreach attempts will be made to reach the patient to complete the Transitions of Care (Post Inpatient/ED visit) call.   Signature  Lauraine Solomons, CMA [facility]

## 2024-01-05 NOTE — Telephone Encounter (Unsigned)
 Copied from CRM [id number]. Topic: General - Other >> Jan 05, 2024  3:36 PM Drema MATSU wrote: Reason for CRM: Patient is returning  call from Lauraine. Please call patient back.

## 2024-01-08 ENCOUNTER — Telehealth: Payer: Self-pay

## 2024-01-08 NOTE — Telephone Encounter (Signed)
 See other phone encounter.

## 2024-01-08 NOTE — Transitions of Care (Post Inpatient/ED Visit) (Signed)
 01/08/2024  Name: [name] MRN: [id number] DOB: April 19, 1956  Today's TOC FU Call Status: Today's TOC FU Call Status:: Successful TOC FU Call Completed TOC FU Call Complete Date: 01/08/24 Patient's Name and Date of Birth confirmed.  Transition Care Management Follow-up Telephone Call Date of Discharge: 12/30/23 Discharge Facility: [facility]) Type of Discharge: Emergency Department Reason for ED Visit: Respiratory How have you been since you were released from the hospital?: Better Any questions or concerns?: No  Items Reviewed: Did you receive and understand the discharge instructions provided?: Yes Medications obtained,verified, and reconciled?: Yes (Medications Reviewed) Any new allergies since your discharge?: No Dietary orders reviewed?: NA Do you have support at home?: Yes  Medications Reviewed Today: Medications Reviewed Today     Reviewed by Eveline Lauraine BRAVO, CMA (Certified Medical Assistant) on 01/08/24 at 1108  Med List Status: <None>   Medication Order Taking? Sig Documenting Provider Last Dose Status Informant  albuterol  (VENTOLIN  HFA) 108 (90 Base) MCG/ACT inhaler [id number] Yes Inhale 1-2 puffs into the lungs every 6 (six) hours as needed for wheezing or shortness of breath. [name], [name], MD  Active Self  benzonatate  (TESSALON ) 100 MG capsule [id number] Yes Take 1 capsule (100 mg total) by mouth every 8 (eight) hours. Robinson, John K, PA-C  Active   budesonide -formoterol  (SYMBICORT ) 160-4.5 MCG/ACT inhaler [id number] Yes Inhale 2 puffs into the lungs 2 (two) times daily. [name], [name], MD  Active   calcium  carbonate (OS-CAL - DOSED IN MG OF ELEMENTAL CALCIUM ) 1250 (500 Ca) MG tablet [id number] Yes Take 1 tablet by mouth 2 (two) times daily with a meal. [provider]  Active Self  Cholecalciferol (VITAMIN D ) 50 MCG (2000 UT) CAPS [id number] Yes Take 2,000 Units by mouth daily. [provider]  Active Self  diclofenac  Sodium (VOLTAREN ) 1 %  GEL [id number] Yes Apply 2 g topically 4 (four) times daily as needed (pain). [provider]  Active Self  DULoxetine  (CYMBALTA ) 20 MG capsule [id number] Yes TAKE 1 CAPSULE BY MOUTH EVERY DAY [name], [name], MD  Active   lisinopril  (ZESTRIL ) 40 MG tablet [id number] Yes TAKE 1 TABLET BY MOUTH DAILY [name], [name], MD  Active   methocarbamol  (ROBAXIN ) 500 MG tablet [id number] Yes Take 1 tablet (500 mg total) by mouth at bedtime as needed for muscle spasms. [name], [name], MD  Active   Multiple Vitamins-Minerals (BARIATRIC MULTIVITAMINS/IRON PO) [id number] Yes Take 1 tablet by mouth daily. [provider]  Active Self  ondansetron  (ZOFRAN -ODT) 4 MG disintegrating tablet [id number] Yes 4mg  ODT q4 hours prn nausea/vomit Pollina, Lonni PARAS, MD  Active   phentermine  (ADIPEX-P ) 37.5 MG tablet [id number] Yes Take 37.5 mg by mouth daily with breakfast. [provider]  Active Self  rosuvastatin  (CRESTOR ) 10 MG tablet [id number] Yes TAKE 1 TABLET BY MOUTH EVERY DAY [name], [name], MD  Active   trolamine salicylate (ASPERCREME) 10 % cream [id number] Yes Apply 1 application topically as needed for muscle pain. [provider]  Active Self            Home Care and Equipment/Supplies: Were Home Health Services Ordered?: No Any new equipment or medical supplies ordered?: No  Functional Questionnaire: Do you need assistance with bathing/showering or dressing?: No Do you need assistance with meal preparation?: No Do you need assistance with eating?: No Do you have difficulty maintaining continence: No Do you need assistance with getting out of bed/getting out of a chair/moving?: No Do you have difficulty  managing or taking your medications?: No  Follow up appointments reviewed: PCP Follow-up appointment confirmed?: NA Specialist Hospital Follow-up appointment confirmed?: NA Do you need transportation to your follow-up appointment?: No Do you understand care options  if your condition(s) worsen?: Yes-patient verbalized understanding    SIGNATURE Lauraine Solomons, CMA

## 2024-01-09 DIAGNOSIS — R5382 Chronic fatigue, unspecified: Secondary | ICD-10-CM | POA: Diagnosis not present

## 2024-01-17 DIAGNOSIS — E785 Hyperlipidemia, unspecified: Secondary | ICD-10-CM | POA: Diagnosis not present

## 2024-01-25 DIAGNOSIS — R7303 Prediabetes: Secondary | ICD-10-CM | POA: Diagnosis not present

## 2024-02-06 NOTE — Procedures (Signed)
Mask fit

## 2024-03-08 ENCOUNTER — Ambulatory Visit: Payer: Medicare Other

## 2024-03-08 VITALS — Ht 65.0 in | Wt 288.0 lb

## 2024-03-08 DIAGNOSIS — Z0000 Encounter for general adult medical examination without abnormal findings: Secondary | ICD-10-CM

## 2024-03-08 DIAGNOSIS — Z1231 Encounter for screening mammogram for malignant neoplasm of breast: Secondary | ICD-10-CM

## 2024-03-08 NOTE — Progress Notes (Signed)
 Subjective:   [name] is a 68 y.o. who presents for a Medicare Wellness preventive visit.  As a reminder, Annual Wellness Visits don't include a physical exam, and some assessments may be limited, especially if this visit is performed virtually. We may recommend an in-person follow-up visit with your provider if needed.  Visit Complete: Virtual I connected with  [name] on 03/08/24 by a audio enabled telemedicine application and verified that I am speaking with the correct person using two identifiers.  Patient Location: Home  Provider Location: Home Office  I discussed the limitations of evaluation and management by telemedicine. The patient expressed understanding and agreed to proceed.  Vital Signs: Because this visit was a virtual/telehealth visit, some criteria may be missing or patient reported. Any vitals not documented were not able to be obtained and vitals that have been documented are patient reported.    Persons Participating in Visit: Patient.  AWV Questionnaire: No: Patient Medicare AWV questionnaire was not completed prior to this visit.  Cardiac Risk Factors include: advanced age (>41men, >95 women);hypertension     Objective:    Today's Vitals   03/08/24 1429  Weight: 288 lb (130.6 kg)  Height: 5' 5 (1.651 m)   Body mass index is 47.93 kg/m.     03/08/2024    2:37 PM 12/30/2023   11:45 AM 08/10/2023   10:09 PM 05/22/2023    9:42 AM 03/06/2023    3:54 PM 03/02/2022    1:19 PM 09/05/2021   10:33 PM  Advanced Directives  Does Patient Have a Medical Advance Directive? No No No No No No No  Would patient like information on creating a medical advance directive? No - Patient declined No - Patient declined No - Patient declined  No - Patient declined No - Patient declined No - Patient declined    Current Medications (verified) Outpatient Encounter Medications as of 03/08/2024  Medication Sig   albuterol  (VENTOLIN  HFA) 108 (90 Base) MCG/ACT  inhaler Inhale 1-2 puffs into the lungs every 6 (six) hours as needed for wheezing or shortness of breath.   benzonatate  (TESSALON ) 100 MG capsule Take 1 capsule (100 mg total) by mouth every 8 (eight) hours.   budesonide -formoterol  (SYMBICORT ) 160-4.5 MCG/ACT inhaler Inhale 2 puffs into the lungs 2 (two) times daily.   calcium  carbonate (OS-CAL - DOSED IN MG OF ELEMENTAL CALCIUM ) 1250 (500 Ca) MG tablet Take 1 tablet by mouth 2 (two) times daily with a meal.   Cholecalciferol (VITAMIN D ) 50 MCG (2000 UT) CAPS Take 2,000 Units by mouth daily.   diclofenac  Sodium (VOLTAREN ) 1 % GEL Apply 2 g topically 4 (four) times daily as needed (pain).   DULoxetine  (CYMBALTA ) 20 MG capsule TAKE 1 CAPSULE BY MOUTH EVERY DAY   lisinopril  (ZESTRIL ) 40 MG tablet TAKE 1 TABLET BY MOUTH DAILY   methocarbamol  (ROBAXIN ) 500 MG tablet Take 1 tablet (500 mg total) by mouth at bedtime as needed for muscle spasms.   Multiple Vitamins-Minerals (BARIATRIC MULTIVITAMINS/IRON PO) Take 1 tablet by mouth daily.   ondansetron  (ZOFRAN -ODT) 4 MG disintegrating tablet 4mg  ODT q4 hours prn nausea/vomit   phentermine  (ADIPEX-P ) 37.5 MG tablet Take 37.5 mg by mouth daily with breakfast.   rosuvastatin  (CRESTOR ) 10 MG tablet TAKE 1 TABLET BY MOUTH EVERY DAY   trolamine salicylate (ASPERCREME) 10 % cream Apply 1 application topically as needed for muscle pain.   No facility-administered encounter medications on file as of 03/08/2024.    Allergies (verified) Ketorolac  tromethamine, Atrovent  [ipratropium], Latex, and Tape   History: Past Medical History:  Diagnosis Date   Abnormal EKG 06/2003   History of inverted T waves V1-V3. Normal 2D echo (07/23/2003): LVEF 65%.   Anemia    BL Hgb 11-12. Ferritin 14 - low normal (08/2007). Colonoscopy 2009 - external hemorrhoids (excellent prep). Last anemia panel (12/2010) - Iron  24, TIBC 269,  B12  316, Folate 11.9, Ferritin 73.   Asthma    B12 deficiency    Back pain    Chest pain     Degenerative joint disease    BL knees (L>R), lumbar spine. Followed by Sports Medicine, Dr. Rosalynn.   Dyspnea    Gastric bypass status for obesity    History of multiple pulmonary nodules    Incidental finding: CT Abd/ Pelvis (04/2010) - Several small lower lobe lung nodules, including one pure ground-glass pulmonary nodule measuring 8 mm in the left lower lobe.  Recommend follow-up chest CT (IV contrast preferred) in 6 months to document stability. //  CT Abd/ Pelvis (07/2010) -  3 mm RLL and  8 mm LLL nodule stable.  Other nodules unchanged, likely benign.   Hyperlipidemia    Hypertension    Incarcerated ventral hernia 04/2010   Noted on CT Abd/ pelvis (04/2010). Patient now s/p ventral hernia repair by Dr. Eletha (12/2010)   Insomnia    Knee osteoarthritis    s/p Left total knee replacement (06/2011)   Left hip pain    Left ventricular diastolic dysfunction    Leg edema    Lower extremity edema    Chronic. 2D echo (2005) - EF 65%.   Obesity    OSA (obstructive sleep apnea)    Palpitations    Positive D dimer    Pre-diabetes    Right knee pain    Past Surgical History:  Procedure Laterality Date   BIOPSY  04/23/2021   Procedure: BIOPSY;  Surgeon: Lyndel Deward PARAS, MD;  Location: [facility];  Service: General;;   COLONOSCOPY  2009   COLONOSCOPY WITH PROPOFOL  N/A 12/18/2017   Procedure: COLONOSCOPY WITH PROPOFOL ;  Surgeon: Avram Lupita BRAVO, MD;  Location: [facility];  Service: Endoscopy;  Laterality: N/A;   ESOPHAGOGASTRODUODENOSCOPY N/A 04/23/2021   Procedure: ESOPHAGOGASTRODUODENOSCOPY (EGD);  Surgeon: Lyndel Deward PARAS, MD;  Location: [facility];  Service: General;  Laterality: N/A;   FOOT SURGERY  2004   left   GASTRIC BYPASS     INCISION AND DRAINAGE ABSCESS ANAL  07/2008   I&D and debridgement of anorectal abscess   INCISIONAL HERNIA REPAIR  12/2010   Repair of incarcerated ventral incisional hernia with Ethicon mesh patch - performed by Dr. Eletha.     INGUINAL HERNIA REPAIR     JOINT REPLACEMENT Left 2013   left knee   TOTAL KNEE ARTHROPLASTY  06/16/2011   Left TOTAL KNEE ARTHROPLASTY;  Surgeon: [place] JULIANNA Pepper;  Location: [facility];  Service: Orthopedics;  Laterality: Left;  left total knee arthroplasty   TUBAL LIGATION     UPPER GI ENDOSCOPY N/A 08/16/2021   Procedure: UPPER GI ENDOSCOPY;  Surgeon: Lyndel Deward PARAS, MD;  Location: [facility];  Service: General;  Laterality: N/A;   Family History  Problem Relation Age of Onset   Diabetes Mother    Hypertension Mother    Stroke Mother    Hyperlipidemia Mother    Heart disease Mother    Sudden death Mother    Kidney disease Mother    Depression Mother  Dementia Father    Heart disease Father    Hyperlipidemia Father    Sudden death Father    Depression Father    Hypertension Sister    Diabetes Brother    Hypertension Brother    Rashes / Skin problems Maternal Grandfather    Heart attack Neg Hx    Social History   Socioeconomic History   Marital status: Married    Spouse name: Tax adviser   Number of children: 3   Years of education: Not on file   Highest education level: 12th grade  Occupational History   Occupation: Stay at home  Tobacco Use   Smoking status: Former    Current packs/day: 0.00    Average packs/day: 0.5 packs/day for 20.0 years (10.0 ttl pk-yrs)    Types: Cigarettes    Start date: 09/10/1978    Quit date: 09/10/1998    Years since quitting: 25.5   Smokeless tobacco: Never   Tobacco comments:    68 yo   Vaping Use   Vaping status: Never Used  Substance and Sexual Activity   Alcohol use: No    Alcohol/week: 0.0 standard drinks of alcohol   Drug use: No   Sexual activity: Yes    Partners: Male  Other Topics Concern   Not on file  Social History Narrative   10/05/2018: Lives with husband, daughter, and 2 grandchildren in [place] house. Lives on main level though mostly.   Has three children altogether, all local, supportive   Currently getting ramp  installed in house d/t pt difficulty getting upstairs.      Social Drivers of Corporate investment banker Strain: Low Risk  (03/08/2024)   Overall Financial Resource Strain (CARDIA)    Difficulty of Paying Living Expenses: Not hard at all  Food Insecurity: No Food Insecurity (03/08/2024)   Hunger Vital Sign    Worried About Running Out of Food in the Last Year: Never true    Ran Out of Food in the Last Year: Never true  Transportation Needs: No Transportation Needs (03/08/2024)   PRAPARE - Administrator, Civil Service (Medical): No    Lack of Transportation (Non-Medical): No  Physical Activity: Insufficiently Active (03/08/2024)   Exercise Vital Sign    Days of Exercise per Week: 2 days    Minutes of Exercise per Session: 30 min  Stress: No Stress Concern Present (03/08/2024)   [employer] of Occupational Health - Occupational Stress Questionnaire    Feeling of Stress: Not at all  Social Connections: Socially Integrated (03/08/2024)   Social Connection and Isolation Panel    Frequency of Communication with Friends and Family: More than three times a week    Frequency of Social Gatherings with Friends and Family: More than three times a week    Attends Religious Services: More than 4 times per year    Active Member of [employer] or Organizations: Yes    Attends Engineer, structural: More than 4 times per year    Marital Status: Married    Tobacco Counseling Counseling given: Not Answered Tobacco comments: 68 yo     Clinical Intake:  Pre-visit preparation completed: Yes  Pain : No/denies pain     BMI - recorded: 47.93 Nutritional Status: BMI > 30  Obese Nutritional Risks: None Diabetes: No  Lab Results  Component Value Date   HGBA1C 5.4 08/09/2021   HGBA1C 5.7 12/02/2019   HGBA1C 5.7 (H) 08/16/2018     How often do you  need to have someone help you when you read instructions, pamphlets, or other written materials from your doctor or pharmacy?:  1 - Never  Interpreter Needed?: No  Information entered by :: Holly Blush LPN   Activities of Daily Living     03/08/2024    2:35 PM  In your present state of health, do you have any difficulty performing the following activities:  Hearing? 0  Vision? 0  Difficulty concentrating or making decisions? 0  Walking or climbing stairs? 1  Comment Uses a Walker, Technical sales engineer or bathing? 0  Doing errands, shopping? Heritage manager and eating ? N  Using the Toilet? N  In the past six months, have you accidently leaked urine? N  Do you have problems with loss of bowel control? N  Managing your Medications? N  Managing your Finances? N  Housekeeping or managing your Housekeeping? N    Patient Care Team: [name], [name], MD as PCP - General (Family Medicine) Shlomo Wilbert SAUNDERS, MD as PCP - Cardiology (Cardiology) Lionell Holly DEL, [facility] (Pharmacist)  I have updated your Care Teams any recent Medical Services you may have received from other providers in the past year.     Assessment:   This is a routine wellness examination for [name].  Hearing/Vision screen Hearing Screening - Comments:: Denies hearing difficulties   Vision Screening - Comments:: Wears rx glasses - up to date with routine eye exams with  Dr Octavia   Goals Addressed               This Visit's Progress     Increase physical activity (pt-stated)        Get more active.       Depression Screen     03/08/2024    2:35 PM 12/11/2023   10:36 AM 03/06/2023    3:49 PM 08/09/2022   10:59 AM 06/06/2022   12:31 PM 03/02/2022    1:13 PM 03/02/2022    1:12 PM  PHQ 2/9 Scores  PHQ - 2 Score 0 0 0 0 1 0 0  PHQ- 9 Score  0  1 3 0 0    Fall Risk     03/08/2024    2:36 PM 06/12/2023    9:54 AM 03/06/2023    3:53 PM 08/09/2022   10:59 AM 06/06/2022   12:31 PM  Fall Risk   Falls in the past year? 0 0 0 1 0  Number falls in past yr: 0  0 1 0  Injury with Fall? 0  0 0 0  Risk for fall due to : No  Fall Risks  No Fall Risks Impaired mobility Other (Comment)  Follow up Falls evaluation completed  Falls prevention discussed Falls evaluation completed Falls evaluation completed      Data saved with a previous flowsheet row definition    MEDICARE RISK AT HOME:  Medicare Risk at Home Any stairs in or around the home?: Yes If so, are there any without handrails?: No Home free of loose throw rugs in walkways, pet beds, electrical cords, etc?: Yes Adequate lighting in your home to reduce risk of falls?: Yes Life alert?: No Use of a cane, walker or w/c?: Yes Grab bars in the bathroom?: No Shower chair or bench in shower?: No Elevated toilet seat or a handicapped toilet?: No  TIMED UP AND GO:  Was the test performed?  No  Cognitive Function: 6CIT completed  03/08/2024    2:38 PM 03/06/2023    3:55 PM 03/02/2022    1:20 PM 02/17/2021    1:16 PM 02/12/2020    9:57 AM  6CIT Screen  What Year? 0 points 0 points 0 points 0 points 0 points  What month? 0 points 0 points 0 points 0 points 0 points  What time? 0 points 0 points 0 points 0 points   Count back from 20 0 points 0 points 0 points 0 points 0 points  Months in reverse 0 points 0 points 0 points 0 points 0 points  Repeat phrase 0 points 0 points 0 points 2 points 6 points  Total Score 0 points 0 points 0 points 2 points     Immunizations Immunization History  Administered Date(s) Administered   Fluad Quad(high Dose 65+) 06/06/2022   Fluad Trivalent(High Dose 65+) 06/12/2023   Influenza Split 03/29/2012   Influenza,inj,Quad PF,6+ Mos 04/09/2013, 09/23/2014, 05/04/2015, 04/04/2016, 03/20/2017, 05/02/2018, 04/27/2020   Moderna Sars-Covid-2 Vaccination 05/07/2020, 06/07/2020   PNEUMOCOCCAL CONJUGATE-20 06/06/2022   Pneumococcal Polysaccharide-23 03/29/2012, 04/04/2016   Tdap 10/14/2010    Screening Tests Health Maintenance  Topic Date Due   Zoster Vaccines- Shingrix (1 of 2) Never done   DTaP/Tdap/Td (2 - Td or  Tdap) 10/13/2020   COVID-19 Vaccine (3 - 2024-25 season) 03/12/2023   MAMMOGRAM  05/01/2023   INFLUENZA VACCINE  02/09/2024   Medicare Annual Wellness (AWV)  03/08/2025   Colonoscopy  12/19/2027   Pneumococcal Vaccine: 50+ Years  Completed   DEXA SCAN  Completed   Hepatitis C Screening  Completed   HPV VACCINES  Aged Out   Meningococcal B Vaccine  Aged Out    Health Maintenance  Health Maintenance Due  Topic Date Due   Zoster Vaccines- Shingrix (1 of 2) Never done   DTaP/Tdap/Td (2 - Td or Tdap) 10/13/2020   COVID-19 Vaccine (3 - 2024-25 season) 03/12/2023   MAMMOGRAM  05/01/2023   INFLUENZA VACCINE  02/09/2024   Health Maintenance Items Addressed: Mammogram ordered  Additional Screening:  Vision Screening: Recommended annual ophthalmology exams for early detection of glaucoma and other disorders of the eye. Would you like a referral to an eye doctor? No    Dental Screening: Recommended annual dental exams for proper oral hygiene  Community Resource Referral / Chronic Care Management: CRR required this visit?  No   CCM required this visit?  No   Plan:    I have personally reviewed and noted the following in the patient's chart:   Medical and social history Use of alcohol, tobacco or illicit drugs  Current medications and supplements including opioid prescriptions. Patient is not currently taking opioid prescriptions. Functional ability and status Nutritional status Physical activity Advanced directives List of other physicians Hospitalizations, surgeries, and ER visits in previous 12 months Vitals Screenings to include cognitive, depression, and falls Referrals and appointments  In addition, I have reviewed and discussed with patient certain preventive protocols, quality metrics, and best practice recommendations. A written personalized care plan for preventive services as well as general preventive health recommendations were provided to patient.   Holly LELON Blush, LPN   1/[id number]/7974   After Visit Summary: (MyChart) Due to this being a telephonic visit, the after visit summary with patients personalized plan was offered to patient via MyChart   Notes: Nothing significant to report at this time.

## 2024-03-08 NOTE — Patient Instructions (Addendum)
 [name] , Thank you for taking time out of your busy schedule to complete your Annual Wellness Visit with me. I enjoyed our conversation and look forward to speaking with you again next year. I, as well as your care team,  appreciate your ongoing commitment to your health goals. Please review the following plan we discussed and let me know if I can assist you in the future. Your Game plan/ To Do List    Referrals: If you haven't heard from the office you've been referred to, please reach out to them at the phone provided.   Follow up Visits: We will see or speak with you next year for your Next Medicare AWV with our clinical staff 03/14/25 @ 3p  Have you seen your provider in the last 6 months (3 months if uncontrolled diabetes)?   Clinician Recommendations:  Aim for 30 minutes of exercise or brisk walking, 6-8 glasses of water , and 5 servings of fruits and vegetables each day.       This is a list of the screenings recommended for you:  Health Maintenance  Topic Date Due   Zoster (Shingles) Vaccine (1 of 2) Never done   DTaP/Tdap/Td vaccine (2 - Td or Tdap) 10/13/2020   COVID-19 Vaccine (3 - 2024-25 season) 03/12/2023   Mammogram  05/01/2023   Flu Shot  02/09/2024   Medicare Annual Wellness Visit  03/08/2025   Colon Cancer Screening  12/19/2027   Pneumococcal Vaccine for age over 16  Completed   DEXA scan (bone density measurement)  Completed   Hepatitis C Screening  Completed   HPV Vaccine  Aged Out   Meningitis B Vaccine  Aged Out    Advanced directives: (Declined) Advance directive discussed with you today. Even though you declined this today, please call our office should you change your mind, and we can give you the proper paperwork for you to fill out. Advance Care Planning is important because it:  [x]  Makes sure you receive the medical care that is consistent with your values, goals, and preferences  [x]  It provides guidance to your family and loved ones and reduces  their decisional burden about whether or not they are making the right decisions based on your wishes.  Follow the link provided in your after visit summary or read over the paperwork we have mailed to you to help you started getting your Advance Directives in place. If you need assistance in completing these, please reach out to us  so that we can help you!  See attachments for Preventive Care and Fall Prevention Tips.

## 2024-04-05 ENCOUNTER — Ambulatory Visit

## 2024-04-18 ENCOUNTER — Ambulatory Visit
Admission: RE | Admit: 2024-04-18 | Discharge: 2024-04-18 | Disposition: A | Discharge status: Home / Self Care | Source: Ambulatory Visit | Attending: Family Medicine | Admitting: Family Medicine

## 2024-04-18 DIAGNOSIS — H35371 Puckering of macula, right eye: Secondary | ICD-10-CM | POA: Diagnosis not present

## 2024-04-18 DIAGNOSIS — Z1231 Encounter for screening mammogram for malignant neoplasm of breast: Secondary | ICD-10-CM | POA: Diagnosis not present

## 2024-04-18 DIAGNOSIS — E119 Type 2 diabetes mellitus without complications: Secondary | ICD-10-CM | POA: Diagnosis not present

## 2024-04-18 DIAGNOSIS — H2513 Age-related nuclear cataract, bilateral: Secondary | ICD-10-CM | POA: Diagnosis not present

## 2024-04-18 DIAGNOSIS — H35413 Lattice degeneration of retina, bilateral: Secondary | ICD-10-CM | POA: Diagnosis not present

## 2024-04-18 DIAGNOSIS — H59813 Chorioretinal scars after surgery for detachment, bilateral: Secondary | ICD-10-CM | POA: Diagnosis not present

## 2024-04-24 NOTE — Progress Notes (Signed)
 [name]                                          MRN: [id number]   04/24/2024   The [facility]BCI Quality Team Specialist reviewed this patient medical record for the purposes of chart review for care gap closure. The following were reviewed: abstraction for care gap closure-breast cancer screening.    VBCI Quality Team

## 2024-05-21 ENCOUNTER — Telehealth: Payer: Self-pay

## 2024-05-21 NOTE — Progress Notes (Signed)
   05/21/2024  Patient ID: [name], female   DOB: [birth year], 68 y.o.   MRN: [id number]  Pharmacy Quality Measure Review  This patient is appearing on a report for being at risk of failing the adherence measure for cholesterol (statin) medications this calendar year.   Medication: Rosuvastatin  10mg  Last fill date: 12/27/23 for 90 day supply  Spoke with patient. She confirms she is still taking crestor  1 tab daily, denies any missed doses. She is aware there are refills available at pharmacy, she will request when ready.  Holly VEAR [place], PharmD Clinical Pharmacist [phone]

## 2024-06-30 ENCOUNTER — Other Ambulatory Visit: Payer: Self-pay | Admitting: Family Medicine

## 2024-06-30 DIAGNOSIS — I10 Essential (primary) hypertension: Secondary | ICD-10-CM

## 2024-06-30 DIAGNOSIS — R252 Cramp and spasm: Secondary | ICD-10-CM

## 2025-03-14 ENCOUNTER — Ambulatory Visit
